# Patient Record
Sex: Female | Born: 1955 | Race: Black or African American | Hispanic: No | Marital: Married | Smoking: Never smoker
Health system: Southern US, Community
[De-identification: ages and names within clinical notes are randomized; demographics above are authoritative.]

## PROBLEM LIST (undated history)

## (undated) DIAGNOSIS — E785 Hyperlipidemia, unspecified: Secondary | ICD-10-CM

## (undated) DIAGNOSIS — Z992 Dependence on renal dialysis: Secondary | ICD-10-CM

## (undated) DIAGNOSIS — K219 Gastro-esophageal reflux disease without esophagitis: Secondary | ICD-10-CM

## (undated) DIAGNOSIS — G2401 Drug induced subacute dyskinesia: Secondary | ICD-10-CM

## (undated) DIAGNOSIS — J189 Pneumonia, unspecified organism: Secondary | ICD-10-CM

## (undated) DIAGNOSIS — I5032 Chronic diastolic (congestive) heart failure: Secondary | ICD-10-CM

## (undated) DIAGNOSIS — I739 Peripheral vascular disease, unspecified: Secondary | ICD-10-CM

## (undated) DIAGNOSIS — I639 Cerebral infarction, unspecified: Secondary | ICD-10-CM

## (undated) DIAGNOSIS — I10 Essential (primary) hypertension: Secondary | ICD-10-CM

## (undated) DIAGNOSIS — K5909 Other constipation: Secondary | ICD-10-CM

## (undated) DIAGNOSIS — T8859XA Other complications of anesthesia, initial encounter: Secondary | ICD-10-CM

## (undated) DIAGNOSIS — I219 Acute myocardial infarction, unspecified: Secondary | ICD-10-CM

## (undated) DIAGNOSIS — M1990 Unspecified osteoarthritis, unspecified site: Secondary | ICD-10-CM

## (undated) DIAGNOSIS — N2581 Secondary hyperparathyroidism of renal origin: Secondary | ICD-10-CM

## (undated) DIAGNOSIS — F32A Depression, unspecified: Secondary | ICD-10-CM

## (undated) DIAGNOSIS — N186 End stage renal disease: Secondary | ICD-10-CM

## (undated) DIAGNOSIS — I509 Heart failure, unspecified: Secondary | ICD-10-CM

## (undated) DIAGNOSIS — D631 Anemia in chronic kidney disease: Secondary | ICD-10-CM

## (undated) DIAGNOSIS — F329 Major depressive disorder, single episode, unspecified: Secondary | ICD-10-CM

## (undated) DIAGNOSIS — T4145XA Adverse effect of unspecified anesthetic, initial encounter: Secondary | ICD-10-CM

## (undated) DIAGNOSIS — I2699 Other pulmonary embolism without acute cor pulmonale: Secondary | ICD-10-CM

## (undated) DIAGNOSIS — N189 Chronic kidney disease, unspecified: Secondary | ICD-10-CM

## (undated) DIAGNOSIS — G629 Polyneuropathy, unspecified: Secondary | ICD-10-CM

## (undated) DIAGNOSIS — I25810 Atherosclerosis of coronary artery bypass graft(s) without angina pectoris: Secondary | ICD-10-CM

## (undated) HISTORY — PX: AV FISTULA PLACEMENT: SHX1204

## (undated) HISTORY — PX: CORONARY ANGIOPLASTY WITH STENT PLACEMENT: SHX49

## (undated) HISTORY — PX: EYE EXAMINATION UNDER ANESTHESIA W/ RETINAL CRYOTHERAPY AND RETINAL LASER: SHX1561

## (undated) HISTORY — PX: TUBAL LIGATION: SHX77

## (undated) HISTORY — PX: TRIGGER FINGER RELEASE: SHX641

---

## 1997-06-11 ENCOUNTER — Other Ambulatory Visit: Admission: RE | Admit: 1997-06-11 | Discharge: 1997-06-11 | Payer: Self-pay | Admitting: Obstetrics and Gynecology

## 1997-09-10 ENCOUNTER — Other Ambulatory Visit: Admission: RE | Admit: 1997-09-10 | Discharge: 1997-09-10 | Payer: Self-pay | Admitting: Obstetrics & Gynecology

## 1998-02-10 ENCOUNTER — Ambulatory Visit (HOSPITAL_COMMUNITY): Admission: RE | Admit: 1998-02-10 | Discharge: 1998-02-10 | Payer: Self-pay | Admitting: Internal Medicine

## 1998-02-10 ENCOUNTER — Encounter: Payer: Self-pay | Admitting: Internal Medicine

## 1998-02-27 ENCOUNTER — Emergency Department (HOSPITAL_COMMUNITY): Admission: EM | Admit: 1998-02-27 | Discharge: 1998-02-27 | Payer: Self-pay

## 1998-03-04 ENCOUNTER — Encounter (HOSPITAL_COMMUNITY): Admission: RE | Admit: 1998-03-04 | Discharge: 1998-06-02 | Payer: Self-pay | Admitting: Nephrology

## 1998-06-02 ENCOUNTER — Encounter (HOSPITAL_COMMUNITY): Admission: RE | Admit: 1998-06-02 | Discharge: 1998-08-31 | Payer: Self-pay | Admitting: Nephrology

## 1998-08-31 ENCOUNTER — Encounter (HOSPITAL_COMMUNITY): Admission: RE | Admit: 1998-08-31 | Discharge: 1998-11-29 | Payer: Self-pay | Admitting: Nephrology

## 1998-12-07 ENCOUNTER — Encounter (HOSPITAL_COMMUNITY): Admission: RE | Admit: 1998-12-07 | Discharge: 1999-03-07 | Payer: Self-pay | Admitting: Nephrology

## 1999-03-08 ENCOUNTER — Encounter: Admission: RE | Admit: 1999-03-08 | Discharge: 1999-06-06 | Payer: Self-pay | Admitting: Nephrology

## 1999-06-07 ENCOUNTER — Ambulatory Visit (HOSPITAL_COMMUNITY): Admission: RE | Admit: 1999-06-07 | Discharge: 1999-06-07 | Payer: Self-pay | Admitting: Nephrology

## 1999-06-07 ENCOUNTER — Encounter (HOSPITAL_COMMUNITY): Admission: RE | Admit: 1999-06-07 | Discharge: 1999-09-05 | Payer: Self-pay | Admitting: Nephrology

## 1999-09-01 ENCOUNTER — Ambulatory Visit (HOSPITAL_COMMUNITY): Admission: RE | Admit: 1999-09-01 | Discharge: 1999-09-01 | Payer: Self-pay | Admitting: Vascular Surgery

## 1999-09-01 ENCOUNTER — Encounter: Payer: Self-pay | Admitting: Vascular Surgery

## 1999-09-07 ENCOUNTER — Ambulatory Visit (HOSPITAL_COMMUNITY): Admission: RE | Admit: 1999-09-07 | Discharge: 1999-09-07 | Payer: Self-pay | Admitting: Nephrology

## 1999-09-13 ENCOUNTER — Encounter (HOSPITAL_COMMUNITY): Admission: RE | Admit: 1999-09-13 | Discharge: 1999-12-12 | Payer: Self-pay | Admitting: Nephrology

## 1999-12-13 ENCOUNTER — Encounter (HOSPITAL_COMMUNITY): Admission: RE | Admit: 1999-12-13 | Discharge: 2000-03-12 | Payer: Self-pay | Admitting: Nephrology

## 2000-01-19 ENCOUNTER — Ambulatory Visit (HOSPITAL_COMMUNITY): Admission: RE | Admit: 2000-01-19 | Discharge: 2000-01-19 | Payer: Self-pay | Admitting: Thoracic Surgery

## 2000-02-22 DIAGNOSIS — I639 Cerebral infarction, unspecified: Secondary | ICD-10-CM

## 2000-02-22 DIAGNOSIS — I219 Acute myocardial infarction, unspecified: Secondary | ICD-10-CM

## 2000-02-22 HISTORY — DX: Acute myocardial infarction, unspecified: I21.9

## 2000-02-22 HISTORY — PX: CORONARY ARTERY BYPASS GRAFT: SHX141

## 2000-02-22 HISTORY — DX: Cerebral infarction, unspecified: I63.9

## 2000-02-22 HISTORY — PX: LAPAROSCOPIC CHOLECYSTECTOMY: SUR755

## 2000-04-20 ENCOUNTER — Encounter: Payer: Self-pay | Admitting: Cardiovascular Disease

## 2000-04-20 ENCOUNTER — Ambulatory Visit (HOSPITAL_COMMUNITY): Admission: RE | Admit: 2000-04-20 | Discharge: 2000-04-20 | Payer: Self-pay | Admitting: Cardiovascular Disease

## 2000-05-10 ENCOUNTER — Ambulatory Visit (HOSPITAL_COMMUNITY): Admission: RE | Admit: 2000-05-10 | Discharge: 2000-05-10 | Payer: Self-pay | Admitting: Vascular Surgery

## 2000-05-18 ENCOUNTER — Ambulatory Visit (HOSPITAL_COMMUNITY): Admission: RE | Admit: 2000-05-18 | Discharge: 2000-05-18 | Payer: Self-pay | Admitting: Obstetrics and Gynecology

## 2000-05-18 ENCOUNTER — Encounter: Payer: Self-pay | Admitting: Obstetrics and Gynecology

## 2000-07-14 ENCOUNTER — Ambulatory Visit (HOSPITAL_COMMUNITY): Admission: RE | Admit: 2000-07-14 | Discharge: 2000-07-14 | Payer: Self-pay | Admitting: Nephrology

## 2000-07-14 ENCOUNTER — Encounter: Payer: Self-pay | Admitting: Nephrology

## 2000-07-16 ENCOUNTER — Emergency Department (HOSPITAL_COMMUNITY): Admission: EM | Admit: 2000-07-16 | Discharge: 2000-07-16 | Payer: Self-pay | Admitting: Emergency Medicine

## 2000-07-17 ENCOUNTER — Ambulatory Visit (HOSPITAL_COMMUNITY): Admission: RE | Admit: 2000-07-17 | Discharge: 2000-07-17 | Payer: Self-pay | Admitting: Vascular Surgery

## 2000-07-18 ENCOUNTER — Ambulatory Visit (HOSPITAL_COMMUNITY): Admission: RE | Admit: 2000-07-18 | Discharge: 2000-07-18 | Payer: Self-pay | Admitting: *Deleted

## 2000-07-18 ENCOUNTER — Encounter: Payer: Self-pay | Admitting: *Deleted

## 2000-08-06 ENCOUNTER — Encounter: Payer: Self-pay | Admitting: Emergency Medicine

## 2000-08-06 ENCOUNTER — Inpatient Hospital Stay (HOSPITAL_COMMUNITY): Admission: EM | Admit: 2000-08-06 | Discharge: 2000-08-08 | Payer: Self-pay | Admitting: Emergency Medicine

## 2000-08-07 ENCOUNTER — Encounter: Payer: Self-pay | Admitting: General Surgery

## 2000-08-09 ENCOUNTER — Encounter: Payer: Self-pay | Admitting: Emergency Medicine

## 2000-08-09 ENCOUNTER — Inpatient Hospital Stay (HOSPITAL_COMMUNITY): Admission: EM | Admit: 2000-08-09 | Discharge: 2000-08-10 | Payer: Self-pay | Admitting: Emergency Medicine

## 2000-08-12 ENCOUNTER — Encounter: Payer: Self-pay | Admitting: Emergency Medicine

## 2000-08-12 ENCOUNTER — Emergency Department (HOSPITAL_COMMUNITY): Admission: EM | Admit: 2000-08-12 | Discharge: 2000-08-12 | Payer: Self-pay | Admitting: Emergency Medicine

## 2000-08-28 ENCOUNTER — Encounter: Payer: Self-pay | Admitting: Nephrology

## 2000-08-28 ENCOUNTER — Inpatient Hospital Stay (HOSPITAL_COMMUNITY): Admission: EM | Admit: 2000-08-28 | Discharge: 2000-09-19 | Payer: Self-pay | Admitting: Emergency Medicine

## 2000-08-28 ENCOUNTER — Encounter: Payer: Self-pay | Admitting: Emergency Medicine

## 2000-08-29 ENCOUNTER — Encounter: Payer: Self-pay | Admitting: Nephrology

## 2000-08-30 ENCOUNTER — Encounter: Payer: Self-pay | Admitting: Nephrology

## 2000-09-01 ENCOUNTER — Encounter: Payer: Self-pay | Admitting: Nephrology

## 2000-09-04 ENCOUNTER — Encounter: Payer: Self-pay | Admitting: Cardiothoracic Surgery

## 2000-09-06 ENCOUNTER — Encounter: Payer: Self-pay | Admitting: Nephrology

## 2000-09-06 ENCOUNTER — Encounter: Payer: Self-pay | Admitting: Cardiothoracic Surgery

## 2000-09-07 ENCOUNTER — Encounter: Payer: Self-pay | Admitting: Cardiothoracic Surgery

## 2000-09-08 ENCOUNTER — Encounter: Payer: Self-pay | Admitting: Cardiothoracic Surgery

## 2000-09-10 ENCOUNTER — Encounter: Payer: Self-pay | Admitting: Pediatrics

## 2000-09-11 ENCOUNTER — Encounter: Payer: Self-pay | Admitting: Cardiothoracic Surgery

## 2000-09-12 ENCOUNTER — Encounter: Payer: Self-pay | Admitting: Cardiothoracic Surgery

## 2000-09-13 ENCOUNTER — Encounter: Payer: Self-pay | Admitting: Cardiothoracic Surgery

## 2000-09-14 ENCOUNTER — Encounter: Payer: Self-pay | Admitting: Cardiothoracic Surgery

## 2000-09-19 ENCOUNTER — Inpatient Hospital Stay (HOSPITAL_COMMUNITY)
Admission: RE | Admit: 2000-09-19 | Discharge: 2000-10-03 | Payer: Self-pay | Admitting: Physical Medicine & Rehabilitation

## 2000-09-20 ENCOUNTER — Encounter: Payer: Self-pay | Admitting: Physical Medicine & Rehabilitation

## 2000-10-02 ENCOUNTER — Encounter: Payer: Self-pay | Admitting: Physical Medicine & Rehabilitation

## 2000-11-09 ENCOUNTER — Encounter
Admission: RE | Admit: 2000-11-09 | Discharge: 2001-02-07 | Payer: Self-pay | Admitting: Physical Medicine & Rehabilitation

## 2000-12-21 ENCOUNTER — Other Ambulatory Visit: Admission: RE | Admit: 2000-12-21 | Discharge: 2000-12-21 | Payer: Self-pay | Admitting: Obstetrics and Gynecology

## 2001-02-08 ENCOUNTER — Encounter
Admission: RE | Admit: 2001-02-08 | Discharge: 2001-05-09 | Payer: Self-pay | Admitting: Physical Medicine & Rehabilitation

## 2001-03-01 ENCOUNTER — Encounter: Payer: Self-pay | Admitting: Cardiovascular Disease

## 2001-03-01 ENCOUNTER — Ambulatory Visit (HOSPITAL_COMMUNITY): Admission: RE | Admit: 2001-03-01 | Discharge: 2001-03-01 | Payer: Self-pay | Admitting: Cardiovascular Disease

## 2001-03-08 ENCOUNTER — Encounter: Payer: Self-pay | Admitting: Nephrology

## 2001-03-08 ENCOUNTER — Ambulatory Visit (HOSPITAL_COMMUNITY): Admission: RE | Admit: 2001-03-08 | Discharge: 2001-03-08 | Payer: Self-pay | Admitting: Nephrology

## 2001-03-16 ENCOUNTER — Encounter: Payer: Self-pay | Admitting: Emergency Medicine

## 2001-03-16 ENCOUNTER — Emergency Department (HOSPITAL_COMMUNITY): Admission: EM | Admit: 2001-03-16 | Discharge: 2001-03-16 | Payer: Self-pay | Admitting: Emergency Medicine

## 2001-04-19 ENCOUNTER — Encounter: Payer: Self-pay | Admitting: Nephrology

## 2001-04-19 ENCOUNTER — Ambulatory Visit (HOSPITAL_COMMUNITY): Admission: RE | Admit: 2001-04-19 | Discharge: 2001-04-19 | Payer: Self-pay | Admitting: Critical Care Medicine

## 2001-05-08 ENCOUNTER — Encounter: Admission: RE | Admit: 2001-05-08 | Discharge: 2001-07-12 | Payer: Self-pay | Admitting: *Deleted

## 2001-06-26 ENCOUNTER — Encounter: Payer: Self-pay | Admitting: Nephrology

## 2001-06-26 ENCOUNTER — Ambulatory Visit (HOSPITAL_COMMUNITY): Admission: RE | Admit: 2001-06-26 | Discharge: 2001-06-26 | Payer: Self-pay | Admitting: Nephrology

## 2001-11-06 ENCOUNTER — Encounter: Payer: Self-pay | Admitting: Nephrology

## 2001-11-06 ENCOUNTER — Ambulatory Visit (HOSPITAL_COMMUNITY): Admission: RE | Admit: 2001-11-06 | Discharge: 2001-11-06 | Payer: Self-pay | Admitting: Nephrology

## 2002-02-20 ENCOUNTER — Ambulatory Visit (HOSPITAL_COMMUNITY): Admission: RE | Admit: 2002-02-20 | Discharge: 2002-02-20 | Payer: Self-pay | Admitting: Nephrology

## 2002-02-20 ENCOUNTER — Encounter: Payer: Self-pay | Admitting: Nephrology

## 2002-07-16 ENCOUNTER — Encounter: Payer: Self-pay | Admitting: Cardiovascular Disease

## 2002-07-16 ENCOUNTER — Ambulatory Visit (HOSPITAL_COMMUNITY): Admission: RE | Admit: 2002-07-16 | Discharge: 2002-07-16 | Payer: Self-pay | Admitting: Cardiovascular Disease

## 2002-08-08 ENCOUNTER — Encounter: Payer: Self-pay | Admitting: Nephrology

## 2002-08-08 ENCOUNTER — Ambulatory Visit (HOSPITAL_COMMUNITY): Admission: RE | Admit: 2002-08-08 | Discharge: 2002-08-08 | Payer: Self-pay | Admitting: Nephrology

## 2002-08-20 ENCOUNTER — Encounter: Payer: Self-pay | Admitting: Urology

## 2002-08-20 ENCOUNTER — Ambulatory Visit (HOSPITAL_COMMUNITY): Admission: RE | Admit: 2002-08-20 | Discharge: 2002-08-20 | Payer: Self-pay | Admitting: Urology

## 2002-12-17 ENCOUNTER — Ambulatory Visit (HOSPITAL_COMMUNITY): Admission: RE | Admit: 2002-12-17 | Discharge: 2002-12-17 | Payer: Self-pay

## 2003-05-27 ENCOUNTER — Ambulatory Visit (HOSPITAL_COMMUNITY): Admission: RE | Admit: 2003-05-27 | Discharge: 2003-05-27 | Payer: Self-pay | Admitting: Nephrology

## 2003-06-22 DIAGNOSIS — I2699 Other pulmonary embolism without acute cor pulmonale: Secondary | ICD-10-CM

## 2003-06-22 HISTORY — DX: Other pulmonary embolism without acute cor pulmonale: I26.99

## 2003-07-04 ENCOUNTER — Inpatient Hospital Stay (HOSPITAL_COMMUNITY): Admission: RE | Admit: 2003-07-04 | Discharge: 2003-07-11 | Payer: Self-pay | Admitting: Cardiovascular Disease

## 2003-07-07 ENCOUNTER — Encounter (INDEPENDENT_AMBULATORY_CARE_PROVIDER_SITE_OTHER): Payer: Self-pay | Admitting: Cardiovascular Disease

## 2003-11-23 ENCOUNTER — Emergency Department (HOSPITAL_COMMUNITY): Admission: EM | Admit: 2003-11-23 | Discharge: 2003-11-23 | Payer: Self-pay | Admitting: Emergency Medicine

## 2003-11-24 ENCOUNTER — Encounter: Admission: RE | Admit: 2003-11-24 | Discharge: 2003-11-24 | Payer: Self-pay | Admitting: Internal Medicine

## 2003-11-28 ENCOUNTER — Ambulatory Visit (HOSPITAL_COMMUNITY): Admission: RE | Admit: 2003-11-28 | Discharge: 2003-11-28 | Payer: Self-pay | Admitting: Internal Medicine

## 2004-06-09 ENCOUNTER — Emergency Department (HOSPITAL_COMMUNITY): Admission: EM | Admit: 2004-06-09 | Discharge: 2004-06-10 | Payer: Self-pay | Admitting: *Deleted

## 2004-08-11 ENCOUNTER — Ambulatory Visit (HOSPITAL_COMMUNITY): Admission: RE | Admit: 2004-08-11 | Discharge: 2004-08-11 | Payer: Self-pay | Admitting: Internal Medicine

## 2004-11-09 ENCOUNTER — Inpatient Hospital Stay (HOSPITAL_COMMUNITY): Admission: AD | Admit: 2004-11-09 | Discharge: 2004-11-11 | Payer: Self-pay | Admitting: Cardiovascular Disease

## 2004-11-22 ENCOUNTER — Ambulatory Visit: Payer: Self-pay | Admitting: Emergency Medicine

## 2004-12-14 ENCOUNTER — Encounter: Admission: RE | Admit: 2004-12-14 | Discharge: 2005-02-10 | Payer: Self-pay | Admitting: Nephrology

## 2004-12-27 ENCOUNTER — Ambulatory Visit: Payer: Self-pay | Admitting: Emergency Medicine

## 2005-06-16 ENCOUNTER — Ambulatory Visit (HOSPITAL_COMMUNITY): Admission: RE | Admit: 2005-06-16 | Discharge: 2005-06-16 | Payer: Self-pay | Admitting: Nephrology

## 2005-08-04 ENCOUNTER — Ambulatory Visit (HOSPITAL_COMMUNITY): Admission: RE | Admit: 2005-08-04 | Discharge: 2005-08-04 | Payer: Self-pay | Admitting: Vascular Surgery

## 2005-09-08 ENCOUNTER — Ambulatory Visit (HOSPITAL_COMMUNITY): Admission: RE | Admit: 2005-09-08 | Discharge: 2005-09-08 | Payer: Self-pay | Admitting: Internal Medicine

## 2005-09-15 ENCOUNTER — Encounter: Admission: RE | Admit: 2005-09-15 | Discharge: 2005-10-12 | Payer: Self-pay | Admitting: Nephrology

## 2005-09-27 ENCOUNTER — Inpatient Hospital Stay (HOSPITAL_COMMUNITY): Admission: EM | Admit: 2005-09-27 | Discharge: 2005-10-01 | Payer: Self-pay | Admitting: Emergency Medicine

## 2005-09-29 ENCOUNTER — Encounter (INDEPENDENT_AMBULATORY_CARE_PROVIDER_SITE_OTHER): Payer: Self-pay | Admitting: Cardiology

## 2005-10-13 ENCOUNTER — Encounter: Admission: RE | Admit: 2005-10-13 | Discharge: 2005-11-07 | Payer: Self-pay | Admitting: Nephrology

## 2005-11-08 ENCOUNTER — Encounter: Admission: RE | Admit: 2005-11-08 | Discharge: 2005-12-08 | Payer: Self-pay | Admitting: Nephrology

## 2005-11-10 ENCOUNTER — Ambulatory Visit (HOSPITAL_COMMUNITY): Admission: RE | Admit: 2005-11-10 | Discharge: 2005-11-10 | Payer: Self-pay | Admitting: Vascular Surgery

## 2005-11-10 ENCOUNTER — Emergency Department (HOSPITAL_COMMUNITY): Admission: EM | Admit: 2005-11-10 | Discharge: 2005-11-10 | Payer: Self-pay | Admitting: Emergency Medicine

## 2005-12-21 ENCOUNTER — Other Ambulatory Visit: Admission: RE | Admit: 2005-12-21 | Discharge: 2005-12-21 | Payer: Self-pay | Admitting: Obstetrics and Gynecology

## 2006-01-31 ENCOUNTER — Encounter: Admission: RE | Admit: 2006-01-31 | Discharge: 2006-05-01 | Payer: Self-pay | Admitting: Nephrology

## 2006-02-02 ENCOUNTER — Encounter: Admission: RE | Admit: 2006-02-02 | Discharge: 2006-02-02 | Payer: Self-pay | Admitting: Nephrology

## 2006-04-25 ENCOUNTER — Ambulatory Visit (HOSPITAL_COMMUNITY): Admission: RE | Admit: 2006-04-25 | Discharge: 2006-04-26 | Payer: Self-pay | Admitting: Ophthalmology

## 2006-05-12 ENCOUNTER — Emergency Department (HOSPITAL_COMMUNITY): Admission: EM | Admit: 2006-05-12 | Discharge: 2006-05-12 | Payer: Self-pay | Admitting: Emergency Medicine

## 2006-06-01 ENCOUNTER — Ambulatory Visit (HOSPITAL_COMMUNITY): Admission: RE | Admit: 2006-06-01 | Discharge: 2006-06-01 | Payer: Self-pay | Admitting: Nephrology

## 2006-07-16 ENCOUNTER — Ambulatory Visit: Payer: Self-pay | Admitting: Vascular Surgery

## 2006-09-26 ENCOUNTER — Ambulatory Visit (HOSPITAL_COMMUNITY): Admission: RE | Admit: 2006-09-26 | Discharge: 2006-09-26 | Payer: Self-pay | Admitting: Nephrology

## 2006-10-31 ENCOUNTER — Ambulatory Visit: Payer: Self-pay | Admitting: Vascular Surgery

## 2006-11-16 ENCOUNTER — Ambulatory Visit (HOSPITAL_COMMUNITY): Admission: RE | Admit: 2006-11-16 | Discharge: 2006-11-16 | Payer: Self-pay | Admitting: Vascular Surgery

## 2007-02-21 ENCOUNTER — Ambulatory Visit (HOSPITAL_COMMUNITY): Admission: RE | Admit: 2007-02-21 | Discharge: 2007-02-21 | Payer: Self-pay | Admitting: Nephrology

## 2007-02-25 ENCOUNTER — Emergency Department (HOSPITAL_COMMUNITY): Admission: EM | Admit: 2007-02-25 | Discharge: 2007-02-25 | Payer: Self-pay | Admitting: Emergency Medicine

## 2007-03-08 ENCOUNTER — Ambulatory Visit: Payer: Self-pay | Admitting: *Deleted

## 2007-03-20 ENCOUNTER — Ambulatory Visit: Payer: Self-pay | Admitting: Vascular Surgery

## 2007-03-20 ENCOUNTER — Ambulatory Visit (HOSPITAL_COMMUNITY): Admission: RE | Admit: 2007-03-20 | Discharge: 2007-03-20 | Payer: Self-pay | Admitting: Vascular Surgery

## 2007-06-26 ENCOUNTER — Ambulatory Visit (HOSPITAL_COMMUNITY): Admission: RE | Admit: 2007-06-26 | Discharge: 2007-06-26 | Payer: Self-pay | Admitting: Nephrology

## 2007-09-07 ENCOUNTER — Encounter: Admission: RE | Admit: 2007-09-07 | Discharge: 2007-09-07 | Payer: Self-pay | Admitting: Nephrology

## 2007-10-04 ENCOUNTER — Ambulatory Visit (HOSPITAL_COMMUNITY): Admission: RE | Admit: 2007-10-04 | Discharge: 2007-10-04 | Payer: Self-pay | Admitting: Nephrology

## 2007-12-25 ENCOUNTER — Ambulatory Visit (HOSPITAL_COMMUNITY): Admission: RE | Admit: 2007-12-25 | Discharge: 2007-12-25 | Payer: Self-pay | Admitting: Nephrology

## 2008-01-14 ENCOUNTER — Ambulatory Visit (HOSPITAL_COMMUNITY): Admission: RE | Admit: 2008-01-14 | Discharge: 2008-01-14 | Payer: Self-pay | Admitting: Nephrology

## 2008-01-24 ENCOUNTER — Ambulatory Visit: Payer: Self-pay | Admitting: *Deleted

## 2008-01-29 ENCOUNTER — Ambulatory Visit: Payer: Self-pay | Admitting: Vascular Surgery

## 2008-01-29 ENCOUNTER — Ambulatory Visit (HOSPITAL_COMMUNITY): Admission: RE | Admit: 2008-01-29 | Discharge: 2008-01-29 | Payer: Self-pay | Admitting: Vascular Surgery

## 2008-03-04 ENCOUNTER — Ambulatory Visit: Payer: Self-pay | Admitting: Vascular Surgery

## 2008-03-31 ENCOUNTER — Ambulatory Visit (HOSPITAL_COMMUNITY): Admission: RE | Admit: 2008-03-31 | Discharge: 2008-03-31 | Payer: Self-pay | Admitting: Nephrology

## 2008-04-08 ENCOUNTER — Ambulatory Visit: Payer: Self-pay | Admitting: Vascular Surgery

## 2008-06-26 ENCOUNTER — Ambulatory Visit (HOSPITAL_COMMUNITY): Admission: RE | Admit: 2008-06-26 | Discharge: 2008-06-26 | Payer: Self-pay | Admitting: Nephrology

## 2008-07-31 ENCOUNTER — Ambulatory Visit (HOSPITAL_BASED_OUTPATIENT_CLINIC_OR_DEPARTMENT_OTHER): Admission: RE | Admit: 2008-07-31 | Discharge: 2008-07-31 | Payer: Self-pay | Admitting: *Deleted

## 2008-08-02 ENCOUNTER — Emergency Department (HOSPITAL_COMMUNITY): Admission: EM | Admit: 2008-08-02 | Discharge: 2008-08-02 | Payer: Self-pay | Admitting: *Deleted

## 2008-09-25 ENCOUNTER — Ambulatory Visit (HOSPITAL_COMMUNITY): Admission: RE | Admit: 2008-09-25 | Discharge: 2008-09-25 | Payer: Self-pay | Admitting: Nephrology

## 2008-11-03 ENCOUNTER — Ambulatory Visit (HOSPITAL_COMMUNITY): Admission: RE | Admit: 2008-11-03 | Discharge: 2008-11-03 | Payer: Self-pay | Admitting: Nephrology

## 2009-01-20 ENCOUNTER — Ambulatory Visit (HOSPITAL_COMMUNITY): Admission: RE | Admit: 2009-01-20 | Discharge: 2009-01-20 | Payer: Self-pay | Admitting: Nephrology

## 2009-01-22 ENCOUNTER — Ambulatory Visit (HOSPITAL_COMMUNITY): Admission: RE | Admit: 2009-01-22 | Discharge: 2009-01-22 | Payer: Self-pay | Admitting: Nephrology

## 2009-02-19 ENCOUNTER — Encounter: Admission: RE | Admit: 2009-02-19 | Discharge: 2009-02-19 | Payer: Self-pay | Admitting: Cardiology

## 2009-03-03 ENCOUNTER — Inpatient Hospital Stay (HOSPITAL_COMMUNITY): Admission: RE | Admit: 2009-03-03 | Discharge: 2009-03-04 | Payer: Self-pay | Admitting: Cardiology

## 2009-04-16 ENCOUNTER — Ambulatory Visit (HOSPITAL_COMMUNITY): Admission: RE | Admit: 2009-04-16 | Discharge: 2009-04-16 | Payer: Self-pay | Admitting: Nephrology

## 2009-04-19 ENCOUNTER — Encounter: Admission: RE | Admit: 2009-04-19 | Discharge: 2009-04-19 | Payer: Self-pay | Admitting: Orthopaedic Surgery

## 2009-06-11 ENCOUNTER — Ambulatory Visit (HOSPITAL_COMMUNITY): Admission: RE | Admit: 2009-06-11 | Discharge: 2009-06-11 | Payer: Self-pay | Admitting: Nephrology

## 2009-07-26 ENCOUNTER — Encounter: Admission: RE | Admit: 2009-07-26 | Discharge: 2009-07-26 | Payer: Self-pay | Admitting: Internal Medicine

## 2009-08-25 ENCOUNTER — Encounter: Admission: RE | Admit: 2009-08-25 | Discharge: 2009-08-25 | Payer: Self-pay | Admitting: Nephrology

## 2009-10-08 ENCOUNTER — Ambulatory Visit (HOSPITAL_COMMUNITY): Admission: RE | Admit: 2009-10-08 | Discharge: 2009-10-08 | Payer: Self-pay | Admitting: Nephrology

## 2009-10-20 ENCOUNTER — Inpatient Hospital Stay (HOSPITAL_COMMUNITY): Admission: EM | Admit: 2009-10-20 | Discharge: 2009-10-22 | Payer: Self-pay | Admitting: Emergency Medicine

## 2009-11-02 ENCOUNTER — Encounter: Admission: RE | Admit: 2009-11-02 | Discharge: 2009-11-02 | Payer: Self-pay | Admitting: Nephrology

## 2009-11-15 ENCOUNTER — Inpatient Hospital Stay (HOSPITAL_COMMUNITY)
Admission: EM | Admit: 2009-11-15 | Discharge: 2009-11-17 | Payer: Self-pay | Source: Home / Self Care | Admitting: Emergency Medicine

## 2009-11-17 ENCOUNTER — Encounter (INDEPENDENT_AMBULATORY_CARE_PROVIDER_SITE_OTHER): Payer: Self-pay | Admitting: Internal Medicine

## 2010-02-09 ENCOUNTER — Ambulatory Visit (HOSPITAL_COMMUNITY)
Admission: RE | Admit: 2010-02-09 | Discharge: 2010-02-09 | Payer: Self-pay | Source: Home / Self Care | Attending: Nephrology | Admitting: Nephrology

## 2010-03-11 ENCOUNTER — Encounter
Admission: RE | Admit: 2010-03-11 | Discharge: 2010-03-11 | Payer: Self-pay | Source: Home / Self Care | Attending: Internal Medicine | Admitting: Internal Medicine

## 2010-03-13 ENCOUNTER — Emergency Department (HOSPITAL_COMMUNITY)
Admission: EM | Admit: 2010-03-13 | Discharge: 2010-03-13 | Payer: Self-pay | Source: Home / Self Care | Admitting: Emergency Medicine

## 2010-03-14 ENCOUNTER — Encounter: Payer: Self-pay | Admitting: Nephrology

## 2010-03-16 LAB — POCT CARDIAC MARKERS
CKMB, poc: 1.7 ng/mL (ref 1.0–8.0)
CKMB, poc: 2.1 ng/mL (ref 1.0–8.0)
Myoglobin, poc: >500 ng/mL (ref 12–200)

## 2010-03-16 LAB — CBC
HCT: 35.4 % — ABNORMAL LOW (ref 36.0–46.0)
Hemoglobin: 11.2 g/dL — ABNORMAL LOW (ref 12.0–15.0)
MCV: 83.9 fL (ref 78.0–100.0)
RDW: 16.2 % — ABNORMAL HIGH (ref 11.5–15.5)
WBC: 7.9 10*3/uL (ref 4.0–10.5)

## 2010-03-16 LAB — BASIC METABOLIC PANEL
BUN: 21 mg/dL (ref 6–23)
Chloride: 94 mEq/L — ABNORMAL LOW (ref 96–112)
GFR calc non Af Amer: 6 mL/min — ABNORMAL LOW (ref >60)
Glucose, Bld: 105 mg/dL — ABNORMAL HIGH (ref 70–99)
Potassium: 3.8 mEq/L (ref 3.5–5.1)
Sodium: 139 mEq/L (ref 135–145)

## 2010-03-16 LAB — DIFFERENTIAL
Basophils Relative: 1 % (ref 0–1)
Eosinophils Relative: 6 % — ABNORMAL HIGH (ref 0–5)
Lymphs Abs: 2.9 10*3/uL (ref 0.7–4.0)
Monocytes Absolute: 0.4 10*3/uL (ref 0.1–1.0)
Monocytes Relative: 5 % (ref 3–12)

## 2010-04-14 ENCOUNTER — Other Ambulatory Visit (HOSPITAL_COMMUNITY): Payer: Self-pay | Admitting: Obstetrics and Gynecology

## 2010-04-14 DIAGNOSIS — Z1231 Encounter for screening mammogram for malignant neoplasm of breast: Secondary | ICD-10-CM

## 2010-04-16 ENCOUNTER — Ambulatory Visit (HOSPITAL_COMMUNITY)
Admission: RE | Admit: 2010-04-16 | Discharge: 2010-04-16 | Disposition: A | Discharge status: Home / Self Care | Payer: Medicare Other | Source: Ambulatory Visit | Attending: Obstetrics and Gynecology | Admitting: Obstetrics and Gynecology

## 2010-04-16 DIAGNOSIS — Z1231 Encounter for screening mammogram for malignant neoplasm of breast: Secondary | ICD-10-CM | POA: Insufficient documentation

## 2010-05-06 LAB — COMPREHENSIVE METABOLIC PANEL
ALT: 26 U/L (ref 0–35)
AST: 30 U/L (ref 0–37)
Albumin: 3.5 g/dL (ref 3.5–5.2)
Calcium: 9.2 mg/dL (ref 8.4–10.5)
Chloride: 102 mEq/L (ref 96–112)
Creatinine, Ser: 5.66 mg/dL — ABNORMAL HIGH (ref 0.4–1.2)
GFR calc Af Amer: 9 mL/min — ABNORMAL LOW (ref >60)
Sodium: 144 mEq/L (ref 135–145)

## 2010-05-06 LAB — GLUCOSE, CAPILLARY
Glucose-Capillary: 109 mg/dL — ABNORMAL HIGH (ref 70–99)
Glucose-Capillary: 164 mg/dL — ABNORMAL HIGH (ref 70–99)
Glucose-Capillary: 182 mg/dL — ABNORMAL HIGH (ref 70–99)
Glucose-Capillary: 187 mg/dL — ABNORMAL HIGH (ref 70–99)
Glucose-Capillary: 194 mg/dL — ABNORMAL HIGH (ref 70–99)
Glucose-Capillary: 249 mg/dL — ABNORMAL HIGH (ref 70–99)
Glucose-Capillary: 147 mg/dL — ABNORMAL HIGH (ref 70–99)

## 2010-05-06 LAB — BASIC METABOLIC PANEL
Calcium: 10.1 mg/dL (ref 8.4–10.5)
Calcium: 9.3 mg/dL (ref 8.4–10.5)
Creatinine, Ser: 4.26 mg/dL — ABNORMAL HIGH (ref 0.4–1.2)
GFR calc Af Amer: 13 mL/min — ABNORMAL LOW (ref >60)
GFR calc Af Amer: 6 mL/min — ABNORMAL LOW (ref >60)
GFR calc non Af Amer: 11 mL/min — ABNORMAL LOW (ref >60)
GFR calc non Af Amer: 5 mL/min — ABNORMAL LOW (ref >60)
Potassium: 4.7 mEq/L (ref 3.5–5.1)
Sodium: 138 mEq/L (ref 135–145)
Sodium: 142 mEq/L (ref 135–145)

## 2010-05-06 LAB — DIFFERENTIAL
Basophils Absolute: 0.1 10*3/uL (ref 0.0–0.1)
Basophils Relative: 2 % — ABNORMAL HIGH (ref 0–1)
Eosinophils Absolute: 0.4 10*3/uL (ref 0.0–0.7)
Eosinophils Relative: 7 % — ABNORMAL HIGH (ref 0–5)
Lymphs Abs: 1.6 10*3/uL (ref 0.7–4.0)
Neutrophils Relative %: 57 % (ref 43–77)

## 2010-05-06 LAB — CBC
HCT: 33.1 % — ABNORMAL LOW (ref 36.0–46.0)
Hemoglobin: 10 g/dL — ABNORMAL LOW (ref 12.0–15.0)
Hemoglobin: 10.5 g/dL — ABNORMAL LOW (ref 12.0–15.0)
MCH: 27.5 pg (ref 26.0–34.0)
MCHC: 32.4 g/dL (ref 30.0–36.0)
Platelets: 237 10*3/uL (ref 150–400)
Platelets: 245 10*3/uL (ref 150–400)
RBC: 3.63 MIL/uL — ABNORMAL LOW (ref 3.87–5.11)
RBC: 3.84 MIL/uL — ABNORMAL LOW (ref 3.87–5.11)
RBC: 4.04 MIL/uL (ref 3.87–5.11)
WBC: 5.3 10*3/uL (ref 4.0–10.5)
WBC: 5.8 10*3/uL (ref 4.0–10.5)

## 2010-05-06 LAB — CK TOTAL AND CKMB (NOT AT ARMC): Total CK: 129 U/L (ref 7–177)

## 2010-05-06 LAB — CULTURE, BLOOD (ROUTINE X 2)
Culture  Setup Time: 201109252255
Culture  Setup Time: 201109252255
Culture: NO GROWTH

## 2010-05-06 LAB — POCT CARDIAC MARKERS: Myoglobin, poc: >500 ng/mL (ref 12–200)

## 2010-05-06 LAB — TROPONIN I: Troponin I: 0.05 ng/mL (ref 0.00–0.06)

## 2010-05-06 LAB — CARDIAC PANEL(CRET KIN+CKTOT+MB+TROPI): Total CK: 122 U/L (ref 7–177)

## 2010-05-07 LAB — GLUCOSE, CAPILLARY
Glucose-Capillary: 107 mg/dL — ABNORMAL HIGH (ref 70–99)
Glucose-Capillary: 202 mg/dL — ABNORMAL HIGH (ref 70–99)
Glucose-Capillary: 254 mg/dL — ABNORMAL HIGH (ref 70–99)

## 2010-05-07 LAB — RENAL FUNCTION PANEL
BUN: 41 mg/dL — ABNORMAL HIGH (ref 6–23)
CO2: 30 mEq/L (ref 19–32)
Chloride: 95 mEq/L — ABNORMAL LOW (ref 96–112)
Glucose, Bld: 117 mg/dL — ABNORMAL HIGH (ref 70–99)
Phosphorus: 5.4 mg/dL — ABNORMAL HIGH (ref 2.3–4.6)
Potassium: 5.2 mEq/L — ABNORMAL HIGH (ref 3.5–5.1)
Sodium: 136 mEq/L (ref 135–145)

## 2010-05-07 LAB — DIFFERENTIAL
Basophils Relative: 2 % — ABNORMAL HIGH (ref 0–1)
Basophils Relative: 2 % — ABNORMAL HIGH (ref 0–1)
Eosinophils Absolute: 0.5 10*3/uL (ref 0.0–0.7)
Eosinophils Relative: 10 % — ABNORMAL HIGH (ref 0–5)
Lymphocytes Relative: 37 % (ref 12–46)
Lymphs Abs: 1.8 10*3/uL (ref 0.7–4.0)
Lymphs Abs: 2 10*3/uL (ref 0.7–4.0)
Monocytes Absolute: 0.4 10*3/uL (ref 0.1–1.0)
Monocytes Relative: 8 % (ref 3–12)
Monocytes Relative: 8 % (ref 3–12)
Neutro Abs: 2.3 10*3/uL (ref 1.7–7.7)
Neutrophils Relative %: 42 % — ABNORMAL LOW (ref 43–77)
Neutrophils Relative %: 44 % (ref 43–77)

## 2010-05-07 LAB — CARDIAC PANEL(CRET KIN+CKTOT+MB+TROPI)
CK, MB: 1.6 ng/mL (ref 0.3–4.0)
Relative Index: 1.3 (ref 0.0–2.5)
Total CK: 131 U/L (ref 7–177)
Troponin I: 0.03 ng/mL (ref 0.00–0.06)

## 2010-05-07 LAB — BRAIN NATRIURETIC PEPTIDE: Pro B Natriuretic peptide (BNP): 471 pg/mL — ABNORMAL HIGH (ref 0.0–100.0)

## 2010-05-07 LAB — BASIC METABOLIC PANEL
BUN: 32 mg/dL — ABNORMAL HIGH (ref 6–23)
GFR calc Af Amer: 7 mL/min — ABNORMAL LOW (ref >60)
GFR calc non Af Amer: 6 mL/min — ABNORMAL LOW (ref >60)
Potassium: 4.9 mEq/L (ref 3.5–5.1)
Sodium: 135 mEq/L (ref 135–145)

## 2010-05-07 LAB — CBC
HCT: 33.9 % — ABNORMAL LOW (ref 36.0–46.0)
Hemoglobin: 11 g/dL — ABNORMAL LOW (ref 12.0–15.0)
MCH: 27.1 pg (ref 26.0–34.0)
MCHC: 32.3 g/dL (ref 30.0–36.0)
MCV: 84 fL (ref 78.0–100.0)
Platelets: 242 10*3/uL (ref 150–400)
RBC: 4.02 MIL/uL (ref 3.87–5.11)
RBC: 4.06 MIL/uL (ref 3.87–5.11)
WBC: 5 10*3/uL (ref 4.0–10.5)
WBC: 5.3 10*3/uL (ref 4.0–10.5)

## 2010-05-07 LAB — POCT CARDIAC MARKERS: Troponin i, poc: <0.05 ng/mL (ref 0.00–0.09)

## 2010-05-07 LAB — MRSA PCR SCREENING: MRSA by PCR: NEGATIVE

## 2010-05-09 LAB — BASIC METABOLIC PANEL
BUN: 44 mg/dL — ABNORMAL HIGH (ref 6–23)
CO2: 26 mEq/L (ref 19–32)
Calcium: 8.9 mg/dL (ref 8.4–10.5)
Chloride: 95 mEq/L — ABNORMAL LOW (ref 96–112)
Creatinine, Ser: 9.12 mg/dL — ABNORMAL HIGH (ref 0.4–1.2)
GFR calc non Af Amer: 6 mL/min — ABNORMAL LOW (ref >60)
Glucose, Bld: 66 mg/dL — ABNORMAL LOW (ref 70–99)
Glucose, Bld: 85 mg/dL (ref 70–99)
Potassium: 4.5 mEq/L (ref 3.5–5.1)
Sodium: 139 mEq/L (ref 135–145)

## 2010-05-09 LAB — GLUCOSE, CAPILLARY
Glucose-Capillary: 123 mg/dL — ABNORMAL HIGH (ref 70–99)
Glucose-Capillary: 147 mg/dL — ABNORMAL HIGH (ref 70–99)
Glucose-Capillary: 83 mg/dL (ref 70–99)
Glucose-Capillary: 91 mg/dL (ref 70–99)

## 2010-05-09 LAB — CBC
HCT: 39 % (ref 36.0–46.0)
HCT: 43.5 % (ref 36.0–46.0)
Hemoglobin: 14.6 g/dL (ref 12.0–15.0)
MCV: 85.2 fL (ref 78.0–100.0)
MCV: 85.5 fL (ref 78.0–100.0)
Platelets: 163 10*3/uL (ref 150–400)
RBC: 4.57 MIL/uL (ref 3.87–5.11)
WBC: 4.8 10*3/uL (ref 4.0–10.5)
WBC: 5.2 10*3/uL (ref 4.0–10.5)

## 2010-05-09 LAB — TSH: TSH: 4.741 u[IU]/mL — ABNORMAL HIGH (ref 0.350–4.500)

## 2010-05-09 LAB — ALT: ALT: 21 U/L (ref 0–35)

## 2010-05-09 LAB — HEPATITIS B CORE ANTIBODY, TOTAL: Hep B Core Total Ab: NEGATIVE

## 2010-05-26 LAB — POCT I-STAT 4, (NA,K, GLUC, HGB,HCT)
HCT: 47 % — ABNORMAL HIGH (ref 36.0–46.0)
Hemoglobin: 16 g/dL — ABNORMAL HIGH (ref 12.0–15.0)
Sodium: 135 mEq/L (ref 135–145)

## 2010-05-26 LAB — POTASSIUM: Potassium: 6.5 mEq/L (ref 3.5–5.1)

## 2010-05-28 LAB — POTASSIUM: Potassium: 4.9 mEq/L (ref 3.5–5.1)

## 2010-05-31 LAB — CBC
Platelets: 267 10*3/uL (ref 150–400)
RBC: 4.63 MIL/uL (ref 3.87–5.11)
WBC: 7.5 10*3/uL (ref 4.0–10.5)

## 2010-05-31 LAB — POCT I-STAT, CHEM 8
Hemoglobin: 16 g/dL — ABNORMAL HIGH (ref 12.0–15.0)
Potassium: 4.4 mEq/L (ref 3.5–5.1)
Sodium: 138 mEq/L (ref 135–145)
TCO2: 33 mmol/L (ref 0–100)

## 2010-05-31 LAB — APTT: aPTT: 34 seconds (ref 24–37)

## 2010-05-31 LAB — GLUCOSE, CAPILLARY: Glucose-Capillary: 128 mg/dL — ABNORMAL HIGH (ref 70–99)

## 2010-05-31 LAB — DIFFERENTIAL
Lymphocytes Relative: 33 % (ref 12–46)
Lymphs Abs: 2.5 10*3/uL (ref 0.7–4.0)
Monocytes Relative: 7 % (ref 3–12)
Neutro Abs: 4.1 10*3/uL (ref 1.7–7.7)
Neutrophils Relative %: 55 % (ref 43–77)

## 2010-05-31 LAB — PROTIME-INR
INR: 1.1 (ref 0.00–1.49)
Prothrombin Time: 14.2 seconds (ref 11.6–15.2)

## 2010-06-22 ENCOUNTER — Other Ambulatory Visit: Payer: Self-pay | Admitting: General Surgery

## 2010-06-22 DIAGNOSIS — N6311 Unspecified lump in the right breast, upper outer quadrant: Secondary | ICD-10-CM

## 2010-06-22 HISTORY — PX: BREAST BIOPSY: SHX20

## 2010-06-22 HISTORY — PX: TOE AMPUTATION: SHX809

## 2010-06-24 ENCOUNTER — Other Ambulatory Visit: Payer: Self-pay | Admitting: General Surgery

## 2010-06-24 ENCOUNTER — Encounter (HOSPITAL_BASED_OUTPATIENT_CLINIC_OR_DEPARTMENT_OTHER): Payer: Medicare (Managed Care) | Attending: Internal Medicine

## 2010-06-24 ENCOUNTER — Ambulatory Visit
Admission: RE | Admit: 2010-06-24 | Discharge: 2010-06-24 | Disposition: A | Discharge status: Home / Self Care | Payer: Medicare Other | Source: Ambulatory Visit | Attending: General Surgery | Admitting: General Surgery

## 2010-06-24 DIAGNOSIS — Z992 Dependence on renal dialysis: Secondary | ICD-10-CM | POA: Insufficient documentation

## 2010-06-24 DIAGNOSIS — N6311 Unspecified lump in the right breast, upper outer quadrant: Secondary | ICD-10-CM

## 2010-06-24 DIAGNOSIS — I120 Hypertensive chronic kidney disease with stage 5 chronic kidney disease or end stage renal disease: Secondary | ICD-10-CM | POA: Insufficient documentation

## 2010-06-24 DIAGNOSIS — I252 Old myocardial infarction: Secondary | ICD-10-CM | POA: Insufficient documentation

## 2010-06-24 DIAGNOSIS — I2510 Atherosclerotic heart disease of native coronary artery without angina pectoris: Secondary | ICD-10-CM | POA: Insufficient documentation

## 2010-06-24 DIAGNOSIS — Z79899 Other long term (current) drug therapy: Secondary | ICD-10-CM | POA: Insufficient documentation

## 2010-06-24 DIAGNOSIS — Z8673 Personal history of transient ischemic attack (TIA), and cerebral infarction without residual deficits: Secondary | ICD-10-CM | POA: Insufficient documentation

## 2010-06-24 DIAGNOSIS — N186 End stage renal disease: Secondary | ICD-10-CM | POA: Insufficient documentation

## 2010-06-24 DIAGNOSIS — E1149 Type 2 diabetes mellitus with other diabetic neurological complication: Secondary | ICD-10-CM | POA: Insufficient documentation

## 2010-06-24 DIAGNOSIS — E1142 Type 2 diabetes mellitus with diabetic polyneuropathy: Secondary | ICD-10-CM | POA: Insufficient documentation

## 2010-06-24 DIAGNOSIS — Z7982 Long term (current) use of aspirin: Secondary | ICD-10-CM | POA: Insufficient documentation

## 2010-06-24 DIAGNOSIS — L97509 Non-pressure chronic ulcer of other part of unspecified foot with unspecified severity: Secondary | ICD-10-CM | POA: Insufficient documentation

## 2010-06-24 DIAGNOSIS — Z951 Presence of aortocoronary bypass graft: Secondary | ICD-10-CM | POA: Insufficient documentation

## 2010-06-24 DIAGNOSIS — E1169 Type 2 diabetes mellitus with other specified complication: Secondary | ICD-10-CM | POA: Insufficient documentation

## 2010-06-25 ENCOUNTER — Ambulatory Visit
Admission: RE | Admit: 2010-06-25 | Discharge: 2010-06-25 | Disposition: A | Discharge status: Home / Self Care | Payer: Medicare Other | Source: Ambulatory Visit | Attending: Family Medicine | Admitting: Family Medicine

## 2010-06-25 ENCOUNTER — Other Ambulatory Visit: Payer: Self-pay | Admitting: Family Medicine

## 2010-06-25 DIAGNOSIS — L8990 Pressure ulcer of unspecified site, unspecified stage: Secondary | ICD-10-CM

## 2010-06-25 DIAGNOSIS — IMO0002 Reserved for concepts with insufficient information to code with codable children: Secondary | ICD-10-CM

## 2010-06-25 DIAGNOSIS — I10 Essential (primary) hypertension: Secondary | ICD-10-CM

## 2010-06-25 DIAGNOSIS — R0989 Other specified symptoms and signs involving the circulatory and respiratory systems: Secondary | ICD-10-CM

## 2010-06-25 NOTE — Assessment & Plan Note (Signed)
[facility]  NAME:  Faith [name], Faith [name] NO.:  [id number]  MEDICAL RECORD NO.:  [id number]      DATE OF BIRTH:  1956/01/04  PHYSICIAN:  Maxwell Caul, M.D. VISIT DATE:  06/24/2010                                  OFFICE VISIT   Faith [name] is a very pleasant 55 year old woman who has been referred by Dr. Dorothe Pea with the [facility] program for review of an ulcer on her plantar left second toe.  The patient is a type 2 diabetic on dialysis.  She tells me she has been a diabetic for 20 years.  She is aware she has neuropathy, but is unaware of any vascular problems.  She tells me that roughly a week and half ago one of our attendants noted that she had a wound on her foot.  Her husband apparently pays attention to her feet and noted discoloration and a wound on the left second toe. She has been on antibiotics, however, she is not precisely sure which ones.  She is insensate, therefore does not really experience any pain but she has not had any fever, chills, and her blood sugars have been generally stable.  MEDICAL HISTORY: 1. Coronary artery disease with an MI and bypass in 2002. 2. Hypertension. 3. History of CVA with mild residual left-sided weakness. 4. Diabetic neuropathy. 5. End-stage renal disease on dialysis. 6. History of hyperparathyroidism. 7. History of anemia. 8. Cholecystectomy in 2002.  Medication list is reviewed.  She is on aspirin, Sensipar, Neurontin, amlodipine, fenofibrate, Zetia, Lyrica, glipizide, Renagel, Protonix, and Phenergan p.r.n.  REVIEW OF SYSTEMS:  RESPIRATORY:  The patient does not complain of shortness of breath.  CARDIAC:  No chest pain and no exertional chest pain.  EXTREMITIES:  She does not complain of claudication in either leg.  PHYSICAL EXAMINATION:  VITAL SIGNS:  Her temperature is 99.7, pulse 74, respirations 20, blood pressure 176/76, her CBG was 153. RESPIRATORY:  Clear air entry  bilaterally. CARDIAC:  Heart sounds were normal.  There is no gallops. EXTREMITIES:  Her peripheral pulses were palpable at the dorsalis pedis and popliteal.  Her ABI on the left was 1.06. NEUROLOGIC:  She has markedly reduced vibration sense, left to the knee, right to roughly the mid tibia.  She has decreased sensation to microfilament test.  She probably is on the way to a Charcot foot deformity, especially on the right.  WOUND EXAM:  The area in question was on her left second toe.  This measured 0.5 x 0.5 x 0.2.  This area was cultured.  There is significant discoloration involving the whole left second toe, perhaps extending somewhat proximally into the dorsal base of the second toe.  As mentioned, the patient is insensate.  She does not experience pain.  The discoloration is probably cellulitis.  However, whether there could be some digital ischemia here I am uncertain.  IMPRESSION: 1. Wegener's grade 2 wound at the tip of the left second toe.  We will     x-ray this to make sure there is not evidence of underlying     osteomyelitis.  The area in question has been cultured.  I believe     she completed a short course of Septra.  I have changed this to  doxycycline and will adjust this based on the cultures done in the     office.  I think the patient probably should have ABIs and arterial     Dopplers at some point, although for purposes today, her peripheral     pulses were palpable and I suspect her arterial supply is probably     adequate.  With specifics of the wound. we dressed this with Prisma hydrogel, Kerlix, and a toe sock.  We put her in a healing sandal which was off- loaded.  As mentioned, she did receive a prescription for doxycycline 100 b.i.d. for 2 weeks.  We will see her back in a week's time.          ______________________________ Maxwell Caul, M.D.     MGR/MEDQ  D:  06/24/2010  T:  06/25/2010  Job:  [id number]

## 2010-06-28 ENCOUNTER — Ambulatory Visit
Admission: RE | Admit: 2010-06-28 | Discharge: 2010-06-28 | Disposition: A | Discharge status: Home / Self Care | Payer: No Typology Code available for payment source | Source: Ambulatory Visit | Attending: Family Medicine | Admitting: Family Medicine

## 2010-06-28 DIAGNOSIS — R0989 Other specified symptoms and signs involving the circulatory and respiratory systems: Secondary | ICD-10-CM

## 2010-06-29 ENCOUNTER — Ambulatory Visit
Admission: RE | Admit: 2010-06-29 | Discharge: 2010-06-29 | Disposition: A | Discharge status: Home / Self Care | Payer: Medicare Other | Source: Ambulatory Visit | Attending: General Surgery | Admitting: General Surgery

## 2010-06-29 ENCOUNTER — Other Ambulatory Visit: Payer: Self-pay | Admitting: Diagnostic Radiology

## 2010-06-29 DIAGNOSIS — N6311 Unspecified lump in the right breast, upper outer quadrant: Secondary | ICD-10-CM

## 2010-06-30 ENCOUNTER — Inpatient Hospital Stay: Admission: RE | Admit: 2010-06-30 | Payer: Medicare Other | Source: Ambulatory Visit

## 2010-07-01 ENCOUNTER — Other Ambulatory Visit (HOSPITAL_BASED_OUTPATIENT_CLINIC_OR_DEPARTMENT_OTHER): Payer: Self-pay | Admitting: Internal Medicine

## 2010-07-01 LAB — GLUCOSE, CAPILLARY

## 2010-07-06 NOTE — Procedures (Signed)
VASCULAR LAB EXAM   INDICATION:  Possible occluded right upper dialysis graft.   HISTORY:  Diabetes:  Yes.  Cardiac:  Coronary artery disease.  Hypertension:  No.   EXAM:  Duplex right upper arm graft.   IMPRESSION:  1. No flow was detected in the right upper arm dialysis graft.  2. Doppler arterial signal of right brachial artery was within normal      limits.   ___________________________________________  P. Liliane Bade, M.D.   DP/MEDQ  D:  03/09/2007  T:  03/09/2007  Job:  [id number]

## 2010-07-06 NOTE — Assessment & Plan Note (Signed)
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  The loop  graft has failed in the right forearm, and attempts at thrombolysis were  unsuccessful because of a long strictured area in the vein draining this  graft.  This was reviewed and the optimal site for insertion of the  graft on the right side is into the high brachial or axillary vein.  Discussed this with her and her family, and we will plan to insert a  right upper arm AV graft on Thursday, September 25 at [facility] as  an outpatient, and hopefully this will provide a satisfactory site for  vascular access for this nice lady.  Her central venogram was reviewed,  and she does have a widely patent central venous system on the right.   Quita Skye Hart Rochester, M.D.  Electronically Signed   JDL/MEDQ  D:  10/31/2006  T:  11/01/2006  Job:  [id number]

## 2010-07-06 NOTE — Op Note (Signed)
NAMEDAEJA, HELDERMAN              ACCOUNT NO.:  [id number]   MEDICAL RECORD NO.:  [id number]          PATIENT TYPE:  AMB   LOCATION:  SDS                          FACILITY:  [facility]   PHYSICIAN:  Larina Earthly, M.D.    DATE OF BIRTH:  28-Apr-1955   DATE OF PROCEDURE:  03/20/2007  DATE OF DISCHARGE:                               OPERATIVE REPORT   PREOPERATIVE DIAGNOSIS:  End-stage renal disease with occluded right arm  arteriovenous Gore-Tex graft.   POSTOPERATIVE DIAGNOSIS:  End-stage renal disease with occluded right  arm arteriovenous Gore-Tex graft.   PROCEDURE:  Thrombectomy and revision of right upper arm AV Gore-Tex  graft.   SURGEON:  Larina Earthly, M.D.   ASSISTANT:  Gae Bon, PA-C.   ANESTHESIA:  MAC.   COMPLICATIONS:  None.   DISPOSITION:  To recovery room stable.   INDICATIONS:  The patient is a 55 year old black female with history of  end-stage renal disease.  She had a functional left upper arm AV graft  which was beginning to deteriorate.  The decision has been made to place  a new right upper arm graft and preparation for new access.  This was  done in the September 2008.  The right upper arm graft subsequently  failed.  She underwent thrombolysis and angioplasty in interventional  radiology and this again failed.  She was seen in our office in early  January and decision was made to try an attempted thrombectomy prior to  abandoning the right upper arm graft.  She is taken operating room at  this time.   PROCEDURE IN DETAIL:  The patient taken to room, placed supine position  where the area of the right arm, right axilla were prepped and draped  usual sterile fashion.  Using local anesthesia was made over the  axillary anastomosis, carried down to isolate the graft anastomosis.  The vein was quite small.  The axillary region was explored further and  there was a second axillary vein which appeared to be a somewhat better  caliber.  The old venous  anastomosis was ligated and the graft was  divided.  The graft itself was thrombectomized.  The arterialized plug  was removed and a good inflow was encountered.  This was flushed  heparinized and reoccluded.  The graft brought into approximation with  the second vein and the vein was occluded proximally and distally,  opened with 11 blade extended longitudinally with Potts scissors.  The  graft spatulated and sewn end-to-side to the vein with a  running 6-0 Prolene suture.  Clamps removed.  Good thrill was noted.  The vein was still somewhat small caliber.  The wounds were irrigated  with saline.  Hemostasis obtained electrocautery, wounds were closed 3-0  Vicryl in the subcutaneous and subcuticular tissue.  Benzoin and Steri-  Strips were applied.      Larina Earthly, M.D.  Electronically Signed     TFE/MEDQ  D:  03/20/2007  T:  03/20/2007  Job:  [id number]

## 2010-07-06 NOTE — Op Note (Signed)
Faith [name], Faith [name]              ACCOUNT NO.:  [id number]   MEDICAL RECORD NO.:  [id number]          PATIENT TYPE:  AMB   LOCATION:  SDS                          FACILITY:  [facility]   PHYSICIAN:  Quita Skye. Hart Rochester, M.D.  DATE OF BIRTH:  1955-04-12   DATE OF PROCEDURE:  11/16/2006  DATE OF DISCHARGE:  11/16/2006                               OPERATIVE REPORT   PREOPERATIVE DIAGNOSIS:  End-stage renal disease.   POSTOPERATIVE DIAGNOSIS:  End-stage renal disease.   OPERATION:  Insertion of right upper arm brachial artery to axillary  vein AV Gore-Tex graft with 6 mm Gore-Tex.   SURGEON:  Quita Skye. Hart Rochester, M.D.   ASSISTANT:  Nurse.   ANESTHESIA:  Local.   PROCEDURE:  The patient was taken to the operating room, placed in the  supine position at which time the right upper extremity was prepped  Betadine scrub solution, draped in a routine sterile manner.  After  infiltration of 1% Xylocaine with epinephrine, longitudinal incisions  was made through previous scar in the distal upper arm.  An old piece of  graft from a forearm revision was removed and the brachial artery was  exposed beneath this, encircled with vessel loops.  Artery had an  excellent pulse and was free of disease.  It was a little on the small  side, probably 3 mm in size.  Second incision was made just distal to  the axilla and the high brachial and axillary vein were dissected free  adjacent to the artery.  It was a 5 mm vein.  The tunnel was then  created on the anterior aspect arm after infiltration with 0.5%  Xylocaine and 6 mm Gore-Tex graft delivered through the tunnel.  No  heparin was used.  Brachial artery was occluded proximally and distally  with vessel loops opened 15 blade, extended with Potts scissors.  Gore-  Tex anastomosed end-to-side with 6-0 Prolene.  Following this the vein  was ligated distally and transected and an end-to-end anastomosis was  done with 6-0 Prolene with a fairly good size match,  vein being just  slightly smaller than the Gore-Tex at 5 mm in size.  This was completed,  there was an excellent pulse and palpable thrill in the graft.  No  protamine or heparin was given.  Wound was irrigated with saline, closed  in layers with Vicryl subcuticular fashion.  Sterile dressing applied.  The patient taken to recovery in satisfactory condition.      Quita Skye Hart Rochester, M.D.  Electronically Signed     JDL/MEDQ  D:  11/16/2006  T:  11/16/2006  Job:  [id number]

## 2010-07-06 NOTE — Assessment & Plan Note (Signed)
OFFICE VISIT   DARLING, Faith [name]  DOB:  [birth year]                                       03/08/2007  ZOXWR#:[id number]   Patient was seen as an add-on today with problems regarding her vascular  access.  She dialyzes at [facility].  Has a right  upper arm AV graft that was placed by Dr. Hart Rochester on 11/16/2006.  She has  a left upper arm AV graft which was functioning; however, clearance was  poor.  The right upper arm graft was placed for new access with plans to  eventually ligate the left upper arm graft.   She had lysis of the right upper arm graft performed on 02/21/07.  The  graft was patent at that time; however, flow was noted to be poor.  The  graft has now subsequently thrombosed.  She is currently being dialyzed  via her left upper arm graft.  This graft is degenerating with poor  clearance.   Duplex evaluation of her right upper arm graft today reveals it to be  occluded.   PHYSICAL EXAMINATION:  Patient is a moderately obese 55 year old [place]-  [place] female.  Her pulse is 95 per minute, and her respirations are  18 per minute.  O2 sat 96%.  She is alert and oriented.  No acute  distress.  She has a 2+ right radial pulse.   Waveform analysis of the right brachial artery reveals triphasic  waveform.  Therefore, inflow looks good.   I do think it is worthwhile to get this right upper arm graft working;  therefore, she will be placed on the operative schedule for thrombectomy  of the right upper arm graft with possible revision.   Balinda Quails, M.D.  Electronically Signed   PGH/MEDQ  D:  03/08/2007  T:  03/09/2007  Job:  [id number]

## 2010-07-06 NOTE — Assessment & Plan Note (Signed)
OFFICE VISIT   Faith [name], Faith [name]  DOB:  1955-02-27                                       01/24/2008  BJYNW#:[id number]   The patient is an end-stage renal failure patient dialysis Monday,  Wednesday, Friday.  She is currently dialyzed via a right upper arm  arteriovenous graft.   She has a left brachial cephalic arteriovenous fistula which remains  patent.  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  She has chronic left arm swelling and pain.  Heaviness in her  left arm.   Evaluation reveals a generally well-appearing 55 year old female.  Alert  and oriented.  Left arm reveals chronic edema.  No ulceration.  Patent  left brachial cephalic arteriovenous fistula.   Will schedule for ligation of left brachial cephalic AV fistula to be  carried out by Dr. Darrick Penna 01/29/2008.  Stop Plavix today until following  surgery.   Balinda Quails, M.D.  Electronically Signed   PGH/MEDQ  D:  01/24/2008  T:  01/25/2008  Job:  [id number]

## 2010-07-06 NOTE — Op Note (Signed)
NAMEHIROKO, Faith [name]              ACCOUNT NO.:  [id number]   MEDICAL RECORD NO.:  [id number]          PATIENT TYPE:  AMB   LOCATION:  SDS                          FACILITY:  [facility]   PHYSICIAN:  Janetta Hora. Fields, MD  DATE OF BIRTH:  [birth year]   DATE OF PROCEDURE:  01/29/2008  DATE OF DISCHARGE:  01/29/2008                               OPERATIVE REPORT   PROCEDURE:  Ligation of left upper arm arteriovenous fistula.   PREOPERATIVE DIAGNOSIS:  Venous hypertension, left upper arm  arteriovenous fistula.   POSTOPERATIVE DIAGNOSIS:  Venous hypertension, left upper arm  arteriovenous fistula.   ANESTHESIA:  Local with IV sedation.   INDICATIONS:  The patient is a 55 year old female who has signs and  symptoms of venous hypertension from a preexisting left upper arm AV  fistula.  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   OPERATIVE DETAILS:  After obtaining informed consent, the patient was  taken to the operating room.  The patient was placed in the supine  position on the operating table.  After adequate sedation, the patient's  entire left upper extremity was prepped and draped in the usual sterile  fashion.  A transverse incision was made over the AV fistula on the left  arm just above the antecubital crease.  Incision was carried down  through the subcutaneous tissues down to the proximal portion of the  fistula.  This was dissected free circumferentially.  This was then  doubly ligated with a 0 silk tie.  There was no further flow into the  fistula in the upper portion of the arm.  The patient had a palpable  brachial pulse below the level of the ligation.  Subcutaneous tissues  were reapproximated using running 3-0 Vicryl suture.  Skin was closed  with 4-0 Vicryl subcuticular stitch.  The patient tolerated the  procedure well and there were no complications.  Instrument, sponge, and  needle counts were correct at the end of the case.  The patient was  taken to the  recovery room in stable condition.      Janetta Hora. Fields, MD  Electronically Signed     CEF/MEDQ  D:  01/29/2008  T:  01/29/2008  Job:  [id number]

## 2010-07-06 NOTE — Assessment & Plan Note (Signed)
OFFICE VISIT   MERTHA, CLYATT  DOB:  1956/02/14                                       04/08/2008  ZOXWR#:[id number]   The patient had ligation of a left upper arm AV fistula by Dr. Darrick Penna on  01/29/2008.  She currently has a functioning right upper arm graft which  she is using for dialysis.  I had seen her in January of this year to  evaluate the aneurysms where her previous fistula had been ligated and  that time I did not see any significant erythema or drainage overlying  the aneurysms.  She comes in for a routine wound check.  Again the  aneurysms had not changed and this overlying skin is intact without  significant erythema or drainage.  I have explained that we could excise  these aneurysms if they became a problem; however, currently I really do  not see any significant problem with these.  Her upper arm graft in the  right is functioning well.   Di Kindle. Edilia Bo, M.D.  Electronically Signed   CSD/MEDQ  D:  04/08/2008  T:  04/09/2008  Job:  [id number]

## 2010-07-06 NOTE — Assessment & Plan Note (Signed)
OFFICE VISIT   Faith, [name]  DOB:  1955/05/31                                       03/04/2008  ZOXWR#:[id number]   I saw the patient in the office today for followup.  She had a left  upper arm AV fistula ligated because of venous hypertension by Dr.  Darrick Penna on 01/29/2008.  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  She came in to have this checked out.   PHYSICAL EXAMINATION:  On examination her incision where the fistula was  ligated has healed nicely.  The fistula is thrombosed.  There is no  erythema or drainage overlying the area of concern.  The skin is just  somewhat thinned out.  I have recommended they keep bacitracin on this  wound and cover it with a Band-Aid and change it once or twice a day.  I  plan on seeing her back in 3 weeks.  If this does not heal the only  other option would be to excise these two thrombosed aneurysms in the  operating room.   Di Kindle. Edilia Bo, M.D.  Electronically Signed   CSD/MEDQ  D:  03/04/2008  T:  03/05/2008  Job:  [id number]

## 2010-07-06 NOTE — Op Note (Signed)
NAMESENA, HOOPINGARNER NO.:  [id number]   MEDICAL RECORD NO.:  [id number]          PATIENT TYPE:  AMB   LOCATION:  DSC                          FACILITY:  [facility]   PHYSICIAN:  Tennis Must Meyerdierks, M.D.DATE OF BIRTH:  07-Jan-1956   DATE OF PROCEDURE:  07/31/2008  DATE OF DISCHARGE:                               OPERATIVE REPORT   PREOPERATIVE DIAGNOSIS:  Trigger finger, right ring.   POSTOPERATIVE DIAGNOSIS:  Trigger finger, right ring.   PROCEDURE:  Release of A1 pulley, right ring finger.   SURGEON:  Lowell Bouton, MD   ANESTHESIA:  Marcaine 0.5% local with sedation.   OPERATIVE FINDINGS:  The patient had significant flexor tenosynovitis  around the flexor tendons of the ring finger.  There was no evidence of  a cyst.   PROCEDURE:  Under 0.5% Marcaine local anesthesia without a tourniquet on  the right arm, the right hand was prepped and draped in the usual  fashion.  An oblique incision was made in line with the distal palmar  crease overlying the ring finger.  Blunt dissection was carried through  the subcutaneous tissues and bleeding points were coagulated.  Blunt  dissection was carried down to the flexor tendon sheath and the A1  pulley was incised sharply.  The pulley was completely released with  scissors.  The finger was then fully flexed, and there was no further  triggering.  The wound was irrigated with saline.  The skin was closed  with 4-0 nylon sutures.  Sterile dressings were applied.  The patient  went to the recovery room awake, stable, and in good condition.      Lowell Bouton, M.D.  Electronically Signed     EMM/MEDQ  D:  07/31/2008  T:  08/01/2008  Job:  [id number]   cc:   Della Goo, M.D.

## 2010-07-08 ENCOUNTER — Encounter (INDEPENDENT_AMBULATORY_CARE_PROVIDER_SITE_OTHER): Payer: PRIVATE HEALTH INSURANCE | Admitting: Vascular Surgery

## 2010-07-08 ENCOUNTER — Other Ambulatory Visit (HOSPITAL_COMMUNITY): Payer: Self-pay | Admitting: Nephrology

## 2010-07-08 DIAGNOSIS — L98499 Non-pressure chronic ulcer of skin of other sites with unspecified severity: Secondary | ICD-10-CM

## 2010-07-08 DIAGNOSIS — I878 Other specified disorders of veins: Secondary | ICD-10-CM

## 2010-07-08 DIAGNOSIS — I739 Peripheral vascular disease, unspecified: Secondary | ICD-10-CM

## 2010-07-08 NOTE — Assessment & Plan Note (Signed)
OFFICE VISIT  SAMARRAH, TRANCHINA DOB:  Oct 17, 1955                                       07/08/2010 ZOXWR#:[id number]  CHIEF COMPLAINT:  Wound on left foot.  HISTORY OF PRESENT ILLNESS:  The patient is a 55 year old female referred by Dr. Dorothe Pea for evaluation of a nonhealing wound of her left second toe.  The patient states that the wound has been there for several weeks and has become progressively worse.  She was recently seen by Dr. Dorothe Pea who ordered a foot x-ray on May 4th.  This showed most likely acute osteomyelitis in the distal left second toe.  The patient denies any significant pain in the toe.  She does not have any prior claudication symptoms.  She also had an arterial duplex exam performed at [facility] on May 7th which showed a toe pressure on the left side of 197 mmHg, but she had calcified vessels which were noncompressible.  ABI on the right side was also noncompressible with a digit pressure of 98 on the right.  She had primarily biphasic waveforms in the popliteal and tibial vessels bilaterally.  The patient was also seen recently by Dr. Aldean Baker who recommended amputation of her left second toe.  The patient is seen today at the request of Dr. Dorothe Pea to see whether or not revascularization would be necessary to heal up the toe amputation.  PAST MEDICAL HISTORY:  The patient's chronic medical problems include end-stage renal disease with Monday, Wednesday, Friday hemodialysis at [facility].  The patient also has a history of diabetes with associated neuropathy and retinopathy.  She also has a history of hypertension, elevated cholesterol and coronary artery disease.  She had coronary bypass grafting in 2002.  She also had a stroke in 2002.  She has recovered from the stroke.  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  SOCIAL HISTORY:  She is married.  She is a nonsmoker and nonconsumer of alcohol.  FAMILY HISTORY:  No history of early vascular disease at age less than 36.  REVIEW OF SYSTEMS:  A full 12-point review of systems was performed with the patient today. URINARY:  Remarkable for end-stage renal disease with Monday, Wednesday, Friday dialysis. HEMATOLOGIC:  She is currently on Plavix and aspirin. HEENT:  She has retinopathy, as mentioned above.  Ears, nose and throat are otherwise normal. MUSCULOSKELETAL:  She has chronic back arthritis. All other systems are negative.  ALLERGIES:  She has allergies listed to codeine and Tylox.  MEDICATIONS:  Include aspirin 81 mg once a day, fenofibrate 134 mg once a day, Nitrostat p.r.n., amlodipine 5 mg p.o. once a day, gabapentin 400 mg once in the morning, Plavix 75 mg once a day, promethazine 25 mg p.r.n. nausea, Lyrica 50 mg p.o. q.h.s., Tylenol p.r.n., Sensipar 90 mg daily, Zetia 10 mg daily, Rena-Vite 1 tablet daily, glipizide 5 mg 1/2 tablet twice a day, Renagel 800 mg 2 tablets t.i.d. and pantoprazole 40 mg p.o. q.h.s.  PHYSICAL EXAMINATION:  Vital Signs:  Blood pressure 182/83 in the left arm, heart rate is 80 and regular, respirations 20.  HEENT: Unremarkable.  Neck:  2+ carotid pulses without bruit.  Chest:  Clear to auscultation.  Cardiac Exam:  Regular rate rhythm without murmur.  Abdomen:  Soft, nontender, nondistended, no masses, slightly obese. Extremities:  She has an easily palpable thrill in the right upper arm AV graft.  She has 2+ femoral pulses bilaterally.  She has absent popliteal and pedal pulses.  She has early gangrenous changes of the left second toe with some edema in the left foot.  Musculoskeletal Exam: Shows otherwise no obvious major joint deformities.  Neurologic Exam: She has decreased sensation to light touch in both feet.  Skin: Otherwise has no open ulcers or rashes except as  mentioned above.  She does have a well-healed saphenectomy site in her right leg.  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  She has severely calcified vessels.  She has early gangrenous changes of her left second toe.  I agree with Dr. Lajoyce Corners that she needs to have an amputation of the left second toe.  However, I have some concerns that this may not heal without revascularization.  In light of this, we have scheduled her for an arteriogram, lower extremity runoff, possible angioplasty and stenting on Friday, Jul 09, 2010.  We will contact her dialysis center to have her dialysis session moved to Saturday, May 19th, so that we can do her arteriogram on Friday.  If she is not a candidate for a stent, we would probably need to consider doing a bypass procedure prior to her toe amputation and will probably be able to schedule this in the following week.  Risks, benefits, possible complications and procedure details including but not limited to bleeding, infection, vessel injury were explained to the patient today.  She understands and agrees to proceed.    Janetta Hora. Fumi Guadron, MD Electronically Signed  CEF/MEDQ  D:  07/08/2010  T:  07/08/2010  Job:  [id number]  cc:   Nadara Mustard, MD Jethro Bastos, M.D.

## 2010-07-09 ENCOUNTER — Ambulatory Visit (HOSPITAL_COMMUNITY)
Admission: RE | Admit: 2010-07-09 | Discharge: 2010-07-09 | Disposition: A | Discharge status: Home / Self Care | Payer: PRIVATE HEALTH INSURANCE | Source: Ambulatory Visit | Attending: Vascular Surgery | Admitting: Vascular Surgery

## 2010-07-09 DIAGNOSIS — L98499 Non-pressure chronic ulcer of skin of other sites with unspecified severity: Secondary | ICD-10-CM | POA: Insufficient documentation

## 2010-07-09 DIAGNOSIS — I739 Peripheral vascular disease, unspecified: Secondary | ICD-10-CM | POA: Insufficient documentation

## 2010-07-09 DIAGNOSIS — L97509 Non-pressure chronic ulcer of other part of unspecified foot with unspecified severity: Secondary | ICD-10-CM | POA: Insufficient documentation

## 2010-07-09 LAB — POCT I-STAT, CHEM 8
BUN: 51 mg/dL — ABNORMAL HIGH (ref 6–23)
Calcium, Ion: 1.07 mmol/L — ABNORMAL LOW (ref 1.12–1.32)
Chloride: 104 mEq/L (ref 96–112)
Creatinine, Ser: 8.5 mg/dL — ABNORMAL HIGH (ref 0.4–1.2)
Glucose, Bld: 52 mg/dL — ABNORMAL LOW (ref 70–99)
HCT: 41 % (ref 36.0–46.0)
Hemoglobin: 13.9 g/dL (ref 12.0–15.0)
Potassium: 5.7 mEq/L — ABNORMAL HIGH (ref 3.5–5.1)
Sodium: 133 mEq/L — ABNORMAL LOW (ref 135–145)
TCO2: 25 mmol/L (ref 0–100)

## 2010-07-09 LAB — GLUCOSE, CAPILLARY
Glucose-Capillary: 122 mg/dL — ABNORMAL HIGH (ref 70–99)
Glucose-Capillary: 109 mg/dL — ABNORMAL HIGH (ref 70–99)
Glucose-Capillary: 67 mg/dL — ABNORMAL LOW (ref 70–99)

## 2010-07-09 NOTE — Op Note (Signed)
[facility]  Patient:    Faith [name], Faith [name]                     MRN: [id number] Proc. Date: 09/01/99 Adm. Date:  [id number] Attending:  Alyson Locket                           Operative Report  PREOPERATIVE DIAGNOSIS:  End-stage renal disease.  POSTOPERATIVE DIAGNOSIS:  End-stage renal disease.  PROCEDURE:  Placement of new left forearm loop arteriovenous Gore-Tex graft.  SURGEON:  Larina Earthly, M.D.  ASSISTANT:  Loura Pardon, P.A.  ANESTHESIA:  Lidocaine 0.5% with epinephrine local and IV sedation.  COMPLICATIONS:  None.  DISPOSITION:  To recovery, stable.  PROCEDURE IN DETAIL:  The patient was taken to the operating room and placed in supine position where the area of the left arm was prepped and draped in the usual sterile fashion.  Using local anesthesia, an incision was made over the antecubital space and carried down to isolate the brachial artery and cephalic vein.  The vein was of good caliber at the antecubital space.  The vein was opened after occluding it proximally and distally and there was some sclerotic area in the vein above the antecubital space and therefore, it was felt to be adequate for outflow for a graft but not for a fistula.  A separate incision was made using local anesthesia over the distal forearm and a loop-configuration tunnel was created in the forearm.  A 6-mm standard wall stretch Gore-Tex was brought through the tunnel.  The carotid vein was occluded proximally and distally and was opened with an 11 blade and extended longitudinally with Pott scissors.  The graft was cut to the appropriate length and was sewn end-to-side to the vein with a running 6-0 Prolene suture. Clamps were removed from the vein, the graft was flushed with heparinized saline and reoccluded.  Next, the brachial artery was occluded proximally and distally and was opened with an 11 blade and extended longitudinally with Pott scissors.  The  graft was cut to the appropriate length and was sewn end-to-side to the artery with a running 6-0 Prolene suture.  A small arteriotomy was created.  The clamps were removed and excellent thrill was noted.  The wound was irrigated with saline and hemostased with electrocautery.  Wounds were closed with 3-0 Vicryl in the subcutaneous and subcuticular tissues and Benzoin and Steri-Strips were applied.  DD:  09/01/99 TD:  09/01/99 Job: [id number]

## 2010-07-09 NOTE — Consult Note (Signed)
[place]  Patient:    Faith [name], Faith [name]                     MRN: [id number] Proc. Date: 08/06/00 Adm. Date:  [id number] Attending:  Garnetta Buddy                          Consultation Report  HISTORY OF PRESENT ILLNESS:  The patient is a 55 year old woman admitted to the renal service who has symptomatic cholelithiasis, possible acute cholecystitis.  The patient has been ill for several weeks.  She has had continuous nausea and vomiting for the past two weeks with no fevers or chills and no jaundice. Fatty foods tend to exacerbate her abdominal complaints.  She recently came into the ER where she was found to have gallstones on a recent ultrasound and evidence of some mild thickening.  A surgical consultation was obtained.  PAST MEDICAL HISTORY:  Non-insulin-dependent diabetes mellitus, hypertension, and end-stage renal disease for which she is getting hemodialysis through an Ash catheter placed two weeks ago.  MEDICATIONS: 1. Norvasc. 2. Toprol. 3. Nephro-Vite. 4. Avandia. 5. Glyburide.  ALLERGIES:  She has no known drug allergies.  PAST SURGICAL HISTORY:  Vascular grafts.  She has had laparoscopic procedures for tubal problems many years ago.  PHYSICAL EXAMINATION:  VITAL SIGNS:  She is afebrile.  Other vital signs are stable.  HEENT:  She is normocephalic, atraumatic, and anicteric.  NECK:  Supple.  CHEST:  Clear.  ABDOMEN:  Tender in the epigastrium and right upper quadrant but mostly in the epigastrium.  PELVIC:  No pelvic exam was done.  RECTAL:  Normal.  LABORATORY DATA:  She has a mildly elevated AST with normal bilirubin.  White count is over 10,000.  She does have a left shift.  She is anemic with a hemoglobin of approximately 8.5, hematocrit of 26%.  IMPRESSION:  End-stage renal disease with anemia and a symptomatic cholelithiasis.  PLAN:  The plan is to take the patient to the operating room for  a laparoscopic cholecystectomy, possible open procedure, and possible cholangiogram. DD:  08/06/00 TD:  08/06/00 Job: [id number]

## 2010-07-09 NOTE — Op Note (Signed)
[name], Faith [name]              ACCOUNT NO.:  [id number]   MEDICAL RECORD NO.:  [id number]          PATIENT TYPE:  OIB   LOCATION:  2550                         FACILITY:  [facility]   PHYSICIAN:  Beulah Gandy. Ashley Royalty, M.D. DATE OF BIRTH:  05/02/1955   DATE OF PROCEDURE:  04/25/2006  DATE OF DISCHARGE:                               OPERATIVE REPORT   ADMISSION DIAGNOSIS:  Proliferative diabetic retinopathy, vitreous  hemorrhage, posterior membranes, left eye.   PROCEDURE:  Pars plana vitrectomy with 25-gauge system, pan retinal  photocoagulation, membrane peel, and gas injection, left eye.   SURGEON:  Alan Mulder, M.D.   ASSISTANT:  Rosalie Doctor, M.A.   ANESTHESIA:  General.   DETAILS:  The usual prep and drape, 25-gauge trocars at 10, 2, and 4  o'clock, infusion port at 4 o'clock.  The lighted pick and the cutter  were placed at 10 at 2 o'clock, respectively.  Provisc placed on the  corneal surface.  Biome viewing system moved into place.  Pars plana  vitrectomy was begun just behind the crystalline lens.  Blood and  vitreous were encountered.  This was carefully removed under low suction  and rapid cutting down to the macular surface where a preretinal  membrane was seen between the disk and the macula.  The membrane was  peeled free from its attachments to the disk and macula with the cutter  and the lighted pick.  The vitrectomy was carried out to the periphery  where large clots of blood were encountered and removed.  The vitreous  base was trimmed for 360 degrees.  The endolaser was positioned in the  eye, 882 burns were placed around the retinal periphery with a power of  1000 milliwatts, 1000 microns each and 0.1 seconds each.  A washout  procedure was performed.  A 30% gas fluid exchange was then performed.  The instruments were removed from the eye and the wounds were held until  tight.  Polymyxin and gentamicin were irrigated into tenon space.  Atropine solution was  applied.  Marcaine was injected around the globe  for postoperative pain.  Decadron 10 mg was injected to the lower  subconjunctival space.  TobraDex ophthalmic ointment and a patch and  shield were placed.  Closing pressure was 10 with a Risk manager.  Complications none.  Duration 30 minutes.  The patient was awakened and  taken to recovery in satisfactory condition.      Beulah Gandy. Ashley Royalty, M.D.  Electronically Signed     JDM/MEDQ  D:  04/25/2006  T:  04/25/2006  Job:  [id number]

## 2010-07-09 NOTE — Op Note (Signed)
NAMESARABI, SOCKWELL              ACCOUNT NO.:  [id number]   MEDICAL RECORD NO.:  [id number]          PATIENT TYPE:  AMB   LOCATION:  SDS                          FACILITY:  [facility]   PHYSICIAN:  Janetta Hora. Fields, MD  DATE OF BIRTH:  09-Jun-1955   DATE OF PROCEDURE:  08/04/2005  DATE OF DISCHARGE:                                 OPERATIVE REPORT   PROCEDURE:  Right brachiocephalic AV fistula.   PREOPERATIVE DIAGNOSIS:  Renal failure.   POSTOPERATIVE DIAGNOSIS:  Renal failure.   ANESTHESIA:  Local with IV sedation.   ASSISTANT:  Coral Ceo, PA - C.   OPERATIVE FINDINGS:  1.  3-mm cephalic vein.   OPERATIVE DETAIL:  Obtaining informed consent, the patient taken to the  operating room.  The patient placed supine position operating table.  After  adequate sedation, the patient's entire right upper extremity prepped and  draped usual sterile fashion.  Local anesthesia was infiltrated near the  antecubital crease.  Transverse incision was made in this location, carried  down through subcutaneous tissues down to level cephalic vein.  There was  some inflammatory reaction around the cephalic vein and it was slightly  sclerotic but of good diameter.  Vein was dissected free circumferentially  for approximately 3 cm.  Next, brachial artery was dissected free in the  medial portion of the incision.  This controlled proximally and distally  with vessel loops.  The patient was then given 3000 units of intravenous  heparin.  Longitudinal arteriotomy was made with control of brachial artery.  Distal cephalic vein was suture ligated with 3-0 silk tie and transected.  The vein was probed and found to accept up to a 3-1/2 dilator.  There was  some area that was fairly sclerotic but there was good flow through the vein  with flushing with heparinized saline.  Vein was controlled proximally with  fine bulldog clamp and swung over to the level of the arteriotomy.  Vein was  then sewn end of vein  to side of artery using a running 7-0 Prolene suture.  Just prior to completion anastomosis, this was fore bled, back bled, and  thoroughly flushed.  Anastomosis was secured, clamps released.  There was  palpable thrill in the fistula immediately.  Hemostasis was obtained with 30  mg of protamine and direct pressure.  Subcutaneous tissues were  reapproximated using running 3-0 Vicryl suture.  Skin was closed with 4-0  Vicryl subcuticular stitch.  The patient tolerated procedure well and there  were no complications.  Instrument sponge and needle counts correct at end  of the case.  The patient taken to recovery room in stable condition.      Janetta Hora. Fields, MD  Electronically Signed    CEF/MEDQ  D:  08/04/2005  T:  08/04/2005  Job:  [id number]

## 2010-07-09 NOTE — Discharge Summary (Signed)
Faith [name]. [facility]  Patient:    Faith [name], BECHT Visit Number: [id number] MRN: [id number]          Service Type: [facility] Location: [id number] Attending Physician:  Faith Rogue T Dictated by:   Dian Situ, PA Admit Date:  09/19/2000 Discharge Date: 10/03/2000   CC:         Duke Salvia. Eliott Nine, M.D.  Dr. Toni Amend, M.D.  Gwenith Daily Tyrone Sage, M.D.  Wilhemina Bonito. Eda Keys., M.D. [facility]   Discharge Summary  DISCHARGE DIAGNOSES: 1. Deconditioning past coronary artery bypass graft. 2. 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 3. End-stage renal disease. 4. Postoperative anemia. 5. Esophageal stricture. 6. Heme-positive stool. 7. Situational depression.  HISTORY OF PRESENT ILLNESS:  Ms. Faith [name] is a 55 year old female with history of diabetes mellitus, end-stage renal disease, recent CVA, admitted to [facility] August 28, 2000, with left substernal epigastric pain and dyspnea with respiratory arrest in ED. She was intubated and noted to have flash pulmonary edema with non-Q-wave MI.  She was stabilized and cardiac catheterization was done showing three vessel disease.  She did require thrombectomy with revision of left arm AV graft.  On September 05, 2000, she underwent CABG x 3 by Ramon Dredge B. Tyrone Sage, M.D.  Postop, was noted to have some anxiety and lethargy.  Mood is noted to be depressed and she was started on Zoloft.  She was to have a short course of Tequin for bronchitis.  Her p.o. intake is noted to be poor.  She is currently noted to be deconditioned with unsteady gait, has ambulated with +2 assist with rolling walker.  Also noted to have heme-positive stools with question of colonoscopy on outpatient basis. CIR consulted for progressive independence goals.  PAST MEDICAL HISTORY:  Significant for diabetes mellitus with end-stage renal disease, dialysis since November 2001,  coronary artery disease, GERD, hyperlipidemia, laparoscopic cholecystectomy and secondary hyperparathyroidism.  ALLERGIES:  CODEINE.  SOCIAL HISTORY:  The patient is married and lives with husband in a one-level home with two to three steps at entry. She was independent but disabled for the past two years.  She does not use any tobacco or alcohol.  HOSPITAL COURSE:  Ms. Faith [name] was admitted to rehab on September 19, 2000, for inpatient therapies to consist of PT and OT daily.  Past admission, the patient was maintained on aspirin for CVA prophylaxis.  Hemodialysis has been ongoing per renal service.  She was treated with Epogen and InfeD for her anemia.  Stool guaiacs were done showing the patient to have heme-positive stools and GI was consulted for work-up.  The patient was evaluated by Wilhemina Bonito. Eda Keys., M.D. and underwent EGD on September 22, 2000, which showed stricture stenosis in distal esophagus.  They recommended continuing Protonix with GI follow-up p.r.n.   Her H&H has been stable overall.  Last CBC of October 02, 2000, shows hemoglobin 10.9, hematocrit 33.1, white count 8.0, platelets 387.  Electrolytes show sodium 134, potassium 4.6, chloride 95, CO2 27, BUN 38, creatinine 8.2, glucose 120. Iron studies done showed iron at 42, TIBC at 219, iron saturation is 90 and ferritin at 1506.  The patient was noted to have occasional bouts of lability and Gladstone Pih, M.D., neuropsych was consulted for input.  He felt that the patient was having an adjustment reaction, decreased mood secondary to her feelings of helpless with multiple medical problems as well as discouragement regarding  her health. She denied loss of interest, signs of anxiety or vegetative thoughts.  Supportive counseling one to two times a week was indicated and the patient has been followed along for this for her mood by psych during her stay. The patient has had much complaints of nausea.  Reglan was added  without much improvement. Part of this nausea has been exacerbated with constipation issue.  She has had problems tolerating sorbitol which tends to exacerbate her nausea additionally.  Phenergan p.r.n. was used with improvement in her symptomatology.  She was taken off Zoloft as a question that this could be exacerbating her symptomatology.  On October 02, 2000, the patient was known to spike a temperature of 100.3. Blood cultures x 3 and UA/UCS were done and sent out that showed no growth. Chest x-ray was done showing increased air space opacity and fluid in the left representing pneumonia with associated peripneumonic pleural effusion all combination of increasing pleural fluid with associated atelectasis.  She was started on Levaquin for this and was discharged to complete two weeks total of antibiotic therapy.  At the time of discharge the patient was at supervision to min. assist for transfers, supervision to min assist ambulating 100 feet with a rolling walker. She required some assist with set-up to supervision for ADL needs.  Home health PT and OT to continue past discharge, 24-hour assistance is required past discharge and the patients husband is to continue to provide this.  On October 03, 2000, the patient is discharged to home.  DISCHARGE MEDICATIONS:  1. Sorbitol 30 cc p.o. per day.  2. Enteric coated aspirin 325 mg per day.  3. Avandia 4 mg b.i.d.  4. Nephro-Vite one per day.  5. Reglan 5 mg a.c. and h.s.  6. Glucotrol 10 mg b.i.d.  7. Remeron 50 mg q.h.s.  8. Toprol XL 12.5 mg per day.  9. InfeD 100 mg IV on days with dialysis. 10. Protonix 40 mg per day. 11. Epogen 24,000 units with hemodialysis. 12. Levaquin 100 mg per day x 2 weeks.  13. Percocet one p.o. q.4-6h. p.r.n. chest wall pain. 14. Phenergan 25 mg suppository q.8h. p.r.n.  ACTIVITY:  She will need 24-hour supervision assistance.  DIET:  A diabetic diet with renal restrictions, 1200 cc fluid  restriction.  SPECIAL INSTRUCTIONS:  No alcohol, no smoking, no driving. Home health PT and OT by [facility].  FOLLOW-UP:  This is with Ulyess Mort, M.D., Dr. Karilyn Cota, Dr. Anne Hahn, Ricki Rodriguez, M.D. in the next few weeks.  Follow-up with Faith Rogue, M.D., October 31, 2000, at 10 a.m. Dictated by:   Dian Situ, PA Attending Physician:  Faith Rogue T DD:  11/08/00 TD:  11/08/00 Job: [id number]

## 2010-07-09 NOTE — H&P (Signed)
Faith [name], Faith [name] NO.:  [id number]   MEDICAL RECORD NO.:  [id number]          PATIENT TYPE:  INP   LOCATION:  1829                         FACILITY:  [facility]   PHYSICIAN:  Wilber Bihari. Caryn Section, M.D.   DATE OF BIRTH:  September 26, 1955   DATE OF ADMISSION:  09/27/2005  DATE OF DISCHARGE:                                HISTORY & PHYSICAL   REASON:  Nausea, vomiting and fever.   HISTORY OF PRESENT ILLNESS:  This is a 55 year old African American female  with an extensive past medical history that includes end-stage renal disease  (hemodialysis Monday, Wednesday and Fridays at the [facility]).  She presented to the emergency room this morning complaining of  approximately a 48-hour history of nausea and vomiting.  The patient awoke  this morning with a temperature and rigors and was brought to the emergency  room by her husband.  The patient husband states that over the weekend the  patient visited with some family members who had some type of virus.  The  patient and husband are unsure if this was a respiratory or GI virus.  The  patient began having some nausea and vomiting Monday which has gradually  worsened and now she presents with an elevated temperature.  The patient's  husband states that she also fell at home Sunday, which is rare for her.  The patient landed on her back and left shoulder.  She does have some  soreness but denies any fractures or ulcers.   PAST MEDICAL HISTORY:  As stated above as well as diabetes mellitus type 2,  coronary artery disease status post three-vessel CABG in July 2002.  History  of cardiac arrest in July 2002 prior to her coronary artery bypass grafting,  peripheral vascular disease, right brain CVA with left hemiparesis, anemia,  secondary hyperparathyroidism, laparoscopic cholecystectomy, anxiety slash  depression, adult acne, polyneuropathy, pulmonary embolism in May 2005.  The  patient completed her course of  Coumadin in October 2006.   ALLERGIES:  FOSRENOL which produces nausea.  LIPITOR, ZOCOR and Niaspan  which produces myositis.   MEDICATIONS:  Aspirin 81 mg daily, Gabapentin 300 mg p.o. q.h.s., glipizide  XL 10 mg p.o. b.i.d., Reglan 5 mg a.c. and h.s., Renagel 800 mg four p.o.  t.i.d. with meals and two with snacks,  Sensipar 90 mg 1 p.o. daily, Zetia  10 mg one p.o. q.h.s., Qualaquin 324 mg b.i.d. p.r.n., AcipHex daily and  Plavix 75 mg daily.   SOCIAL HISTORY:  The patient lives in a house with her husband.  They have  one son who is 89 years old, he is alive and well.  Denies any alcohol,  tobacco or illicit drug use.  She has a twelfth grade education.   FAMILY HISTORY:  The patient's mother is deceased in her 35s secondary to  cirrhosis of the liver.  The patient's father is deceased at age 49  secondary to diabetes mellitus and coronary artery disease.  The patient has  three sisters all of which are in their 2s, two of which are diabetics, one  is insulin dependent and the other sister has hypertension.   REVIEW OF SYSTEMS:  Generalized, the patient has had fever, chills and  sweating as stated above.  The patient has also had decreased appetite and  increased fatigue in the last 48 hours as well.  HEENT: The patient has a  slight frontal headache that since this a.m. which is new.  The patient  denies any rhinorrhea, photophobia, vision or hearing loss. DERMATOLOGICAL:  The patient denies any rashes or lesions.  She does have a left cheek ulcer  per the husband. CARDIOPULMONARY:  The patient does have a cough which is  new and has been progressing over the last few weeks.  It is nonproductive.  The patient denies chest pain, shortness of breath, dyspnea on exertion,  edema, palpitations, claudication, or wheezing.  ENDOCRINE:  The patient  denies polyuria, dipsia heat or cold intolerance.  GU: The patient denies  frequency, urgency, dysuria, hematuria, nocturia or menopause.   NEUROPSYCH:  The patient again has had some weakness that progressed over the weekend  since she began feeling bad.  She did fall as stated above in HPI.  Denies  any depression or anxiety. MUSCULOSKELETAL:  Denies any myalgias or  arthralgias, joint swelling, deformity or pain.  GASTROINTESTINAL:  The  patient has had some nausea, vomiting as stated above. The patient denies  any diarrhea, bright red blood per rectum, melena, hematemesis, dysphagia,  odynophagia, abdominal pain or changes in the patient's bowel habits.   PHYSICAL EXAM:  Temperature 100.4, pulse 102, respirations 18, blood  pressure 144/59, oxygen saturation 95% on room air.  GENERAL:  This is a lethargic African American female who appears stated  age.  She awakened easily to verbal stimuli and answers questions  appropriately.  HEENT: Head is normocephalic and atraumatic.  EOMs are intact.  The patient  has bilateral 3+ dark cerumen noted in bilateral ear canals.  Nares are  patent without drainage or edema.  Mucous membranes are moist.  Dentition is  in poor condition.  Oropharynx is without erythema or exudate.  NECK:  Is supple without lymphadenopathy, thyromegaly, bruit or JVD.  The  patient does have a pin-point size dark sore noted on her left cheek.  A  scab is present.  There is no redness, drainage noted.  The patient does  have some firmness noted around that pinpoint ulcer that is approximately  nickel size.  The patient denies any pain with palpation.  CARDIOVASCULAR:  Heart rate is tachycardic with S1-S2 intact, pulses are 1+  and regular and has no murmurs, rubs, gallops, or clicks noted.  RESPIRATORY:  Poor effort.  The patient does have decreased breath sounds  noted in bilateral lower lobes.  She is otherwise clear.  No wheezes, rubs  or rhonchi or rales or rhonchi noted.  DERMATOLOGICAL:  No rashes noted.  The patient does have some facial acne and the pin-point sized sore as stated in the HEENT  section.  GASTROINTESTINAL:  Abdomen is soft, obese, nontender, nondistended.  Bowel  sounds are present in all four quadrants.  No rebound found or guarding  noted.  EXTREMITIES: No cyanosis, clubbing or edema present.  No new rashes, lesions  or petechiae present.  HEMODIALYSIS ACCESS:  The patient has a left upper extremity AV graft with  positive thrill and bruit.  The patient has a right upper extremity AV  fistula with positive thrill and bruit as well as next.  MUSCULOSKELETAL: No joint deformity effusions  or spinal tenderness.  NEUROLOGICAL:  The patient is oriented x3 as stated above.  She is slightly  lethargic, however, she awakens easily to verbal stimuli.  The patient does  have new bilateral upper extremity slight intention tremors, that have began  this morning per the husband.  RADIOLOGICAL EXAMINATIONS:  One-view chest x-ray revealed no acute findings.   LABORATORY DATA:  WBC 10.1, hemoglobin 13.6, hematocrit 41.1, platelets 268.  Sodium 131, potassium 5.1, chloride 99, BUN 26, glucose 146, pH 7.511, pCO2  37.7, bicarb 30.2, pCO2 31.   ASSESSMENT/PLAN:  1.  Nausea, vomiting and fevers with questionable source.  The patient will      be pancultured and chest x-ray was performed as stated above.  We will      begin antibiotic therapy empirically and await culture results.  2.  New bilateral upper extremity intention tremors with history of CVA.      The patient's Reglan will be d/c'd at this time given the potential EPS      side effects. Head CT scan will be performed as a precaution without      contrast.  3.  End-stage renal disease.  The patient is a hemodialysis patient Monday,      Wednesdays and Fridays at the [facility].  The      patient received a full treatment yesterday and will be scheduled for      hemodialysis tomorrow in order to attempt to keep the patient on her      outpatient regimen.  4.  Anemia.  The patient will be continued  on low dose Epogen therapy during      her hospital stay.  We will monitor hemoglobin, hematocrit throughout      her hospitalization and adjust medications as needed.  5.  Secondary hyperparathyroidism.  The patient will be continued on low      dose vitamin D therapy per outpatient regimen.  The patient's phosphate      binders will be currently held secondary to the patient's vomiting.      They will be restarted once the patient is tolerating her p.o. diet      without difficulty.  6.  Diabetes mellitus.  The patient will be placed on Accu-Cheks a.c. and      h.s. with sensitive sliding scale insulin.  Her oral glycemic agent will      be initiated once the patient is with a good p.o. intake.   DISPOSITION:  The patient will return home at the time of discharge with her  husband.      Tracey P. Sherrod, NP    ______________________________  Wilber Bihari. Caryn Section, M.D.   TPS/MEDQ  D:  09/27/2005  T:  09/27/2005  Job:  [id number]

## 2010-07-09 NOTE — Op Note (Signed)
[place]. [facility]  Patient:    Faith [name], Faith [name]                     MRN: [id number] Proc. Date: 08/07/00 Adm. Date:  [id number] Attending:  Garnetta Buddy CC:         Dr. Elvis Coil   Operative Report  PREOPERATIVE DIAGNOSIS:  Symptomatic cholelithiasis.  POSTOPERATIVE DIAGNOSIS:  Symptomatic cholelithiasis.  OPERATION PERFORMED:  Laparoscopic cholecystectomy.  SURGEON:  Jimmye Norman, M.D.  ASSISTANT:  Abigail Miyamoto, M.D.  ANESTHESIA:  General endotracheal.  ESTIMATED BLOOD LOSS:  Less than 30 cc.  COMPLICATIONS:  None.  CONDITION:  Stable.  INDICATIONS FOR PROCEDURE:  The patient is a 55 year old end-stage renal disease patient with non-insulin-dependent diabetes and hypertension, who has gallstones and pain of epigastric and right upper quadrant pain.  OPERATIVE FINDINGS:  The patient had a mildly inflamed gallbladder with adhesions to the duodenum.  There was no evidence of perforation.  DESCRIPTION OF PROCEDURE:  The patient was taken to the operating room and placed on the table in supine position.  After an adequate general anesthetic was administered, she was prepped and draped in the usual sterile fashion exposing the midline and the right upper quadrant of the abdomen.  Initially, a superumbilical curvilinear incision was made using a #11 blade. We subsequently dissected down to the midline fascia through which a Veress needle was passed into the peritoneal cavity and confirmed to be inadequate position using the saline test.  Once this was done, carbon dioxide insufflation was instilled into the peritoneal cavity up to a maximum intra-abdominal pressure of 15 mmHg.  Once this was done, an 11 mm cannula was passed through the superumbilical fascia into the peritoneal cavity and confirmed to be in position with the laparoscope and attached camera and light source.  Subsequently, two right costal margin 5 mm cannulas and a  subxiphoid 10-11 mm cannula were placed into the peritoneal cavity under direct vision. With all cannulas in place, the patient was placed in steeper reversed Trendelenburg position.  The left side was tilted down and the dissection begun.  The gallbladder was grasped at the dome and retracted towards the anterior abdominal wall and then to the right upper quadrant.  There were adhesions to the second portion of duodenum which were taken down using electrocautery and blunt dissection.  This exposed the hepatoduodenal triangle and the triangle of Calot.  The peritoneum overlying the triangle of Calot was incised using electrocautery and then we subsequently dissected out the cystic duct.  The cystic artery apparently coursed along with the cystic duct and was clipped doubly along with the cystic duct.  There was no separate arterial structure which was endoclipped and was never seen during the course of the procedure. We dissected the gallbladder out of its bed using a spatula dissector with minimal bleeding.  The gallbladder was removed using an Endocatch bag from the superumbilical site.  There was a small entrance into the gallbladder during the procedure dissecting out of its bed with small stones being extruded; however, most of these were removed.  Once we had removed the gallbladder, we irrigated with a liter of saline, removed most of this bile that had spilled and then aspirated most of the gas out of the peritoneal cavity.  We then closed the superumbilical fascial site using a figure-of-eight stitch of 0 Vicryl and the skin was closed using running subcuticular stitch of 4-0 Vicryl.  0.25% Marcaine  with epinephrine was injected at all sites.  Sponge, needle and instrument counts were correct. D:  08/07/00 TD:  08/07/00 Job: [id number]

## 2010-07-09 NOTE — Op Note (Signed)
Faith [name], Faith [name] NO.:  [id number]   MEDICAL RECORD NO.:  [id number]          PATIENT TYPE:  EMS   LOCATION:  MAJO                         FACILITY:  [facility]   PHYSICIAN:  Janetta Hora. Fields, MD  DATE OF BIRTH:  Oct 22, 1955   DATE OF PROCEDURE:  11/10/2005  DATE OF DISCHARGE:                                 OPERATIVE REPORT   PROCEDURE:  Right forearm arteriovenous graft.   PREOPERATIVE DIAGNOSIS:  End-stage renal disease.   POSTOPERATIVE DIAGNOSIS:  End-stage renal disease.   ANESTHESIA:  Local with IV sedation.   ASSISTANT:  Nurse   OPERATIVE FINDINGS:  1. A 3 mm deep brachial vein.  2. A 6 mm polytetrafluoroethylene graft.   DESCRIPTION OF PROCEDURE:  After obtaining an informed consent, the patient  was taken to the operating.  The patient placed  in the supine position on  the operating table.  After adequate sedation, the patient's entire right  upper extremity was prepped and draped in the usual sterile fashion.  Local  anesthesia was infiltrated at a pre-existing scar at the antecubital crease.  An incision was made in this location and carried down through the  subcutaneous tissues, down to level of a pre-existing brachiocephalic AV  fistula.  The vein was dissected free circumferentially.  The brachial  artery was then controlled proximal to the fistula.  The ulnar and radial  arteries were then dissected free just distal to the fistula.  These were  controlled with Vesi-loops.  The adjacent deep brachial vein was inspected  and found to be sclerotic and of poor quality.  Therefore, local anesthesia  was infiltrated just above the elbow.  A longitudinal incision made in this  location and carried down through the subcutaneous tissues, down to the  level of the brachial vein.  This was approximately 3 mm in diameter in this  location.  It was dissected free circumferentially and small side branches  ligated and divided between silk ties.  Local  anesthesia was infiltrated in  the distal forearm.  A transverse incision was made in this location for  assistance in tunneling.  A 6-mm PTFE graft was then brought up in the  operative field and placed in a subcutaneous loop configuration with the  arterial limb of the graft on the radial aspect of the forearm.  The patient  was then given 5000 units of intravenous heparin.  Vesi-loops were pulled  taut on the artery.  A longitudinal arteriotomy was made.  The graft was  beveled and sewn end-to-graft, to side-of-artery using a running #6-0  Prolene suture.  The old fistula was completely taken down and ligated  distally.  Prior to the completion of the anastomosis this was fore-bled,  back-bled  and thoroughly flushed.  The anastomosis was secured.  The clamps  were released. There was a good pulse within the graft.  The graft was then  clamped in the upper arm incision.  The vein was then controlled proximally  and distally with fine bulldog clamps.  A longitudinal venotomy was made.  The graft was beveled and sewn end-to-graft  to side-of-vein using running #6-  0 Prolene suture.  Just prior to completion of the anastomosis this was fore-  bled, back-bled and thoroughly flushed.  The anastomosis was secured.  Clamps were released.  There was a palpable thrill in the graft immediately.  Next hemostasis was obtained.  The subcutaneous tissues of all three  incisions were closed with running #3-0 Vicryl suture.  The skin of all  three incisions then closed with #4-0 Vicryl subcuticular stitch.  The  patient had a weakly palpable radial pulse which augmented with compression  of the graft.  There was a good radial Doppler signal.   The patient tolerated the procedure well and there were no complications.  The sponge and needle counts were correct at the end of case.  The patient  taken to recovery room in stable condition.      Janetta Hora. Fields, MD  Electronically Signed      CEF/MEDQ  D:  11/10/2005  T:  11/11/2005  Job:  [id number]

## 2010-07-09 NOTE — Discharge Summary (Signed)
[place]. [facility]  Patient:    Faith [name], Faith [name]                     MRN: [id number] Adm. Date:  [id number] Disc. Date: [id number] Attending:  Cathren Laine Dictator:   Sheffield Slider, P.A.C. CC:         Jimmye Norman, M.D.  [facility]   Discharge Summary  DISCHARGE DIAGNOSES: 1. Cholecystitis, status post laparoscopic cholecystectomy.2 2. Pulmonary edema, resolved. 3. Right base infiltrate. 4. Type 2 diabetes mellitus. 5. Coronary artery disease with history of stent placement. 6. Hypertension. 7. End-stage renal disease on chronic hemodialysis.  PROCEDURES: 1. Laparoscopic cholecystectomy, Dr. Jimmye Norman, on August 07, 2000. 2. Hemodialysis. 3. Right upper quadrant ultrasound on August 06, 2000, that showed gallstones    without evidence of wall thickening and slightly small kidneys.  HISTORY OF PRESENT ILLNESS:  Faith [name] is a 55 year old, African-American female who presented in the emergency room on June 16, with nausea, vomiting and retrosternal chest pain radiating to the back for the past two weeks which has been intermittent in nature.  She has had some funny food inversions.  She states she has end-stage renal disease on chronic hemodialysis and recently had a clot in a right forearm graft.  The patient denies any shortness of breath, diaphoresis or syncopal spells.  She states the pain is usually relieved with Aleve or Bayer aspirin, but this now does not control it.  She presented to the emergency room for further relief of her pain.  LABORATORY DATA AND X-RAY FINDINGS:  EKG showed normal sinus rhythm.  Chest x-ray showed mild pulmonary edema.  Labs with white count 10,600, hemoglobin 8.9, hematocrit 26, platelets 508,000.  Sodium 138, potassium 3.5, chloride 97, CO2 31, BUN 27, creatinine 7.7, glucose 270, calcium 9.4, mildly increased AST.  CK 120, MB 0.05, troponin 0.2.  HOSPITAL COURSE:  The patient was  admitted to the renal unit.  Dr. Lindie Spruce was called to evaluate the patient for possible cholecystectomy.  She was placed on her usual medications and diet and a laparoscopic cholecystectomy was planned for June 17.  She was dialyzed on June 16, for her pulmonary edema and again on June 17, following her surgery.  Follow up chest x-ray on June 17, prior to her second dialysis showed mild pulmonary edema with increasing right lower lobe air space disease.  For this, she was started on some empiric Tequin.  She had no significant fever and her white count was 14,100 with a normal differential.  The patient had good bowel sounds following her surgery. She was evaluated by Dr. Lindie Spruce on June 18, who felt she was okay for discharge.  On the morning of June 18, she was quite sluggish and difficult to arouse presumably due to her pain medication.  She tolerated her lunch on June 18, and was able to be discharged home.  CONDITION ON DISCHARGE:  Improved.  DISCHARGE MEDICATIONS:  1. Vicodin one to two tablets every four to six hours as needed, #30     dispensed.  2. Niaspan 1500 mg per day.  3. Nephro-Vite one tablet per day.  4. Nitrostat 0.4 mg sublingual.  5. Norvasc 10 mg per day.  6. Toprol XL 50 mg per day.  7. Avandia 4 mg b.i.d.  8. Glyburide 2.5 mg b.i.d.  9. Tequin 200 mg per day x 13 more days for a two-week total. 10. Enteric coated aspirin one  tablet per day.  DIET:  80 g protein, 2 g sodium, 2 g potassium diabetic diet.  Limited fluids to five cups per day.  WOUND CARE:  Remove dressings in two days.  May shower and pat wounds dry.  FOLLOWUP:  The patient is to follow up in two weeks with Dr. Lindie Spruce.  SPECIAL INSTRUCTIONS:  Increase Epogen dose to 14,000 units IV every hemodialysis.  Estimated dry weight 82 kg.  INFeD dose the same.  No heparin for one treatment and then heparin x 2 weeks, then resume standard heparin. DD: 08/16/00 TD:  08/16/00 Job: [id number]

## 2010-07-09 NOTE — Cardiovascular Report (Signed)
NAME:  Faith [name], Faith [name]                        ACCOUNT NO.:  [id number]   MEDICAL RECORD NO.:  [id number]                   PATIENT TYPE:  INP   LOCATION:  2028                                 FACILITY:  [facility]   PHYSICIAN:  Ricki Rodriguez, M.D.               DATE OF BIRTH:  09-14-1955   DATE OF PROCEDURE:  07/09/2003  DATE OF DISCHARGE:  07/11/2003                              CARDIAC CATHETERIZATION   PROCEDURE:  1. Left heart catheterization.  2. Selective coronary angiography.  3. Left ventricular function study.  4. Bypass graft study.   INDICATIONS:  This 55 year old black female had chest pain, shortness of  breath and abnormal nuclear stress test.   APPROACH:  Right femoral artery using 5 French sheath and catheters.   COMPLICATIONS:  None.  Smart needle was used for vascular access.   HEMODYNAMIC DATA:  The left ventricular pressure was 131/14 aortic pressure  was 131/69.   LEFT VENTRICULOGRAM:  The left ventriculogram showed normal left ventricular  systolic function with ejection fraction of 70%.   CORONARY ANATOMY:  The left main coronary artery was long with luminal  irregularities.   Left anterior descending coronary artery:  The left anterior descending  coronary artery had proximal 50% narrowing, then 95% stenosis followed by  100% stenosis.  The diagonal one was unremarkable.  Diagonal two had ostial  80% stenosis, but was less than 2 mm vessel and origin of the diagonal two  vessel was at 95% stenosis of the LAD.   Left circumflex coronary artery:  The left circumflex coronary artery was  essentially unremarkable.  Ramus branch had diffuse narrowing.  Obtuse  marginal branch one had mild proximal disease.  Obtuse marginal branch two  was very small and obtuse marginal branch three had competitive flow.   Right coronary artery:  The right coronary artery had ostial to proximal  total occlusion.   Saphenous vein graft to right coronary artery:  The  saphenous vein graft to  the right coronary artery had ostial 20%, proximal 50% long concentric  stenosis and it was unremarkable.   Saphenous vein graft to obtuse marginal branch:  The saphenous vein graft to  obtuse marginal branch was widely patent.  The obtuse marginal branch was  unremarkable with TIMI-3 flow.   LIMA to LAD:  Left internal mammary artery to LAD was widely patent and  filled LAD well with TIMI-3 flow.   IMPRESSION:  1. Multivessel native vessel coronary artery disease.  2. Moderate saphenous vein graft to right coronary artery disease.  3. Patent saphenous vein graft to obtuse marginal branch one and patent left     internal mammary artery to left anterior descending coronary artery.   RECOMMENDATIONS:  This patient will be treated medically for now.  Ricki Rodriguez, M.D.    ASK/MEDQ  D:  08/07/2003  T:  08/07/2003  Job:  [id number]

## 2010-07-09 NOTE — Op Note (Signed)
[facility]  Patient:    Faith [name], Faith [name]                     MRN: [id number] Proc. Date: 07/17/00 Adm. Date:  [id number] Attending:  Alyson Locket                           Operative Report  PREOPERATIVE DIAGNOSIS:  Chronic renal failure.  POSTOPERATIVE DIAGNOSIS:  Chronic renal failure.  PROCEDURES: 1. Thrombectomy of left forearm arteriovenous graft. 2. Insertion of new segment of graft to more proximal brachial vein.  SURGEON:  Di Kindle. Edilia Bo, M.D.  ASSISTANT:  Loura Pardon, P.A.  ANESTHESIA:  Local with sedation.  DESCRIPTION OF PROCEDURE:  The patient was taken to the operating room and sedated by anesthesia.  The left upper extremity was prepped and draped in the usual sterile fashion.  After the skin was infiltrated with 1% lidocaine, the incision over the venous anastomosis was opened and the venous limb of the graft dissected free.  Of note, the vein here was quite small, and I extended the incision further proximally and found a slightly larger vein; however, once this was ligated, I could not pass the 2 mm dilator up this.  I therefore had to extend the incision fairly high up the upper arm to find an adequate brachial vein for venous outflow.  This vein was ligated, and it did irrigate easily with heparinized saline.  Graft thrombectomy was then achieved using a #4 Fogarty catheter after the graft was divided.  The venous anastomosis was ligated, and the venous segment of the graft was excised.  The new segment of graft was spatulated and sewn end-to-end to the spatulated brachial vein using continuous 6-0 Prolene suture.  The new segment of graft was then anastomosed end-to-end to the old graft after it was cut to the appropriate length.  This was done with continuous 6-0 Prolene suture.  At completion, there was an excellent thrill in the graft.  Hemostasis was obtained in the wound, and the wound was closed with a deep  layer of 3-0 Vicryl and the skin closed with 4-0 Vicryl.  If this graft clots in the future, would recommend a new upper arm graft.  Of note, there was a small amount of fluid around the venous anastomosis, and a culture was sent intraoperatively. DD:  07/17/00 TD:  07/17/00 Job: [id number]

## 2010-07-09 NOTE — Discharge Summary (Signed)
NAME:  Faith [name], Faith [name]                        ACCOUNT NO.:  [id number]   MEDICAL RECORD NO.:  [id number]                   PATIENT TYPE:  INP   LOCATION:  2028                                 FACILITY:  [facility]   PHYSICIAN:  Ricki Rodriguez, M.D.               DATE OF BIRTH:  [birth year]   DATE OF ADMISSION:  07/04/2003  DATE OF DISCHARGE:  07/11/2003                                 DISCHARGE SUMMARY   PRINCIPAL/FINAL DIAGNOSES:  1. Pulmonary embolism.  2. Severe native vessel coronary artery disease.  3. Status post coronary artery bypass graft surgery.  4. Hypertension.  5. Diabetes mellitus, type 2.  6. Cerebrovascular accident.   DISCHARGE MEDICATIONS:  1. Coumadin 4 mg 1 p.o. daily.  2. Toprol-XL 25 mg 1 daily.  3. Protonix 40 mg 1 daily.  4. Quinine 260 mg 1 daily.  5. Renagel 800 mg 3 tablets 3 times daily.  6. Remeron 15 mg 1 daily.  7. Reglan 5 mg a.c. and h.s.  8. Nephro-Vite 1 p.o. daily.  9. Glucotrol XL 10 mg 1 twice daily.  10.      Actos 45 mg daily.  11.      Advicor 500/20 mg 1 daily.  12.      Lovenox 60 mg subcutaneously daily x2 days.   DISCHARGE DIET:  Low-fat, low-salt diet.   DISCHARGE ACTIVITY:  Discharge activity as tolerated.   WOUND CARE INSTRUCTIONS:  Patient to notify if right groin pain, swelling or  discharge.   CONDITION ON DISCHARGE:  Improved.   HISTORY:  This 55 year old black female presented with exertional dyspnea  and orthopnea of 2 weeks' duration without chest pain, however, she had some  nausea and has a past history of diabetes mellitus for 30 years,  hypertension for 20 years, she quit smoking 10 years ago, has history of  elevated cholesterol.  She had gallbladder surgery in 2002, angioplasty in  1998 and bypass graft surgery in 2002.   PHYSICAL EXAMINATION:  VITAL SIGNS:  Temperature 96.2, pulse 80,  respirations 20, blood pressure 134/64; height 5 feet 5 inches, weight 187  pounds.  GENERAL:  The patient was alert,  oriented x3.  HEENT:  Head normocephalic, atraumatic.  Eyes brown with sclerae white.  Ears/nose/throat:  Mucous membranes pink.  Patient was edentulous.  NECK:  No JVD.  LUNGS:  Lungs clear anteriorly with rare basilar crackles and a midline scar  of surgery.  HEART:  S1 and S2 normal with a 2/6 systolic murmur.  ABDOMEN:  Abdomen was soft.  EXTREMITIES:  No edema and left-sided weakness.  CNS:  Cranial nerves grossly intact with left lower extremity weakness more  than upper extremity.   LABORATORY DATA:  Laboratory data revealed a hemoglobin of 15.8, hematocrit  48.7, normal WBC count, normal platelet count; subsequent hemoglobin and  hematocrit remained stable throughout the hospital stay.  PT was 14.1 with  an  INR of 1.1; subsequent PT was 15.5 with an INR of 1.4.  Sodium 140,  potassium 4.8, BUN 15, creatinine 6.1, glucose elevated at 146; subsequent  glucoses were 72-123.  Liver enzymes slightly elevated at 53 and 55 on Jul 09, 2003.  CK-MB and troponin I were essentially unremarkable.   Chest x-ray revealed mild chronic bronchitic and interstitial changes.   CT angiogram showed a filling defect in the right segmental branches of the  pulmonary artery as well as one on the left, likely represent pulmonary  emboli.   Nuclear stress test revealed a small area of myocardial ischemia in mid-to-  distal inferolateral wall.   Cardiac catheterization showed multivessel native vessel coronary artery  disease with moderate saphenous-vein-graft-to-right-coronary-artery disease  and patent saphenous vein graft to obtuse marginal branch as well as LIMA to  left anterior descending coronary artery.   HOSPITAL COURSE:  Patient was admitted to telemetry unit.  She was started  on Lovenox and CT chest was ordered; she also received oxygen by nasal  cannula.  Her condition gradually improved over 48 hours.  She underwent  nuclear stress test that showed reversible ischemia in  anterolateral wall.  Her CT of the chest showed filling defect with strong possibility of  pulmonary embolism.  Her cardiac catheterization showed severe native vessel  coronary artery disease but patent bypass grafts.  She was started on  Coumadin and with her INR steadily improving, she was discharged home in  satisfactory condition with followup by me in 1-2 weeks and followup by  renal doctor in 1-2 weeks.                                                Ricki Rodriguez, M.D.    ASK/MEDQ  D:  08/28/2003  T:  08/29/2003  Job:  [id number]

## 2010-07-09 NOTE — Op Note (Signed)
[place]  Patient:    [name], [name]                     MRN: [id number] Adm. Date:  [id number] Attending:  Dayle Points CC:         Llana Aliment. Deterding, M.D.   Operative Report  PREOPERATIVE DIAGNOSIS:  Thrombosed left upper arm arteriovenous Gore-Tex graft.  POSTOPERATIVE DIAGNOSIS:  Thrombosed left upper arm arteriovenous Gore-Tex graft.  PROCEDURE:  Thrombectomy and patch angioplasty revision of venous end.  SURGEON:  D. Karle Plumber, M.D.  FIRST ASSISTANT:  Gina H. Collins, P.A.-C.  DESCRIPTION OF PROCEDURE:  After percutaneous insertion of all monitoring lines, the patient underwent general anesthesia, was prepped and draped in the usual sterile manner.  The left upper arm was infiltrated with 1% Xylocaine at the incision at the antecubital fossa as well as at the axillary fossa. Incision was made and carried down with electrocautery through the subcutaneous tissue, down to the Gore-Tex graft.  The Gore-Tex graft was dissected out as an end-to-end anastomosis, and then it was dissected down to the axillary vein.  There was a loop proximally and distally, then another area was made over the arterial anastomosis and dissection was carried down to the arterial anastomosis, and this was looped with a vascular tape.  There was some fluid around the graft, which was cultured.  The patient was given 4000 units of heparin, and a longitudinal incision was made in the graft and dissected up across the anastomosis at the venous end.  There was intimal hyperplasia there.  It was brought up approximately 1 cm up the vein until the vein was normal, when it was back to normal consistency and there was no venous intimal hyperplasia.  A Gore-Tex patch was cut in an oval-shaped patch. A proximal and distal thrombectomy was carried out with a #4 Fogarty catheter and then a 5 Fogarty catheter with first just fair inflow and then finally after  passing the catheter through the arterial anastomosis, there was fair to good inflow.  The graft was clamped and then the patch angioplasty performed in an oval shape with 6-0 Prolene in a running, continuous fashion on the venous end.  All clamps were removed, and there was good flow through the graft.  Wounds were closed with 3-0 Vicryl and Steri-Strips.  The patient returned to the recovery room in stable condition. DD:  08/31/00 TD:  08/31/00 Job: [id number]

## 2010-07-09 NOTE — Cardiovascular Report (Signed)
. [facility]  Patient:    Faith [name], Faith [name]                     MRN: [id number] Proc. Date: 09/01/00 Adm. Date:  [id number] Attending:  Dayle Points                        Cardiac Catheterization  PROCEDURE DONE BY:  Ricki Rodriguez, M.D.  PROCEDURES:  Left heart catheterization, selective coronary angiography, left ventricular function study, left subclavian artery injection for left internal mammary artery.  INDICATIONS:  This 55 year old black female with a known history of coronary artery disease had a non-Q-wave myocardial infarction and congestive heart failure.  The patient additionally has end-stage renal disease.  COMPLICATIONS:  None.  HEMODYNAMIC DATA:  Left ventricular pressure was 162/21.  Aortic pressure was 162/72 with a mean pressure of 115 mmHg.  ANGIOGRAPHIC DATA:  Left ventriculogram:  The left ventriculogram showed mild inferior and anterior wall hypokinesia with an ejection fraction of 45%.  Left subclavian artery injection:  The left subclavian artery injection showed patent left internal mammary artery.  There was rapid return and venous system showing innominate vein.  Coronary anatomy: 1. The left main coronary artery was unremarkable. 2. Left anterior descending artery:  The left anterior descending coronary    artery had a proximal 20-30% stenosis followed by a 99% moderately long    lesion.  The distal left anterior descending coronary artery wraps    around the apex of the heart.  The diagonal #1 vessel had mild proximal    disease.  The diagonal #2 vessel was a larger vessel with an ostial 80%    stenosis.  Diagonal #3 and #4 were very small vessels. 3. Left circumflex coronary artery:  The left circumflex coronary artery    showed a very small obtuse marginal branch #2 and a relatively small    ramus branch.  The obtuse marginal branch #1 had mild disease, and obtuse    marginal branch #3 had ostial  80-90% stenosis.  It somehow appeared to be    codominant with the right coronary artery.  The left circumflex artery    itself had a gradual mid vessel narrowing followed by an eccentric 70-80%    lesion. 4. Right coronary artery:  The right coronary artery had a proximal long 50%    long in-stent restenosis followed by a 99% stenosis in the mid vessel, and    the distal vessel had mild luminal irregularities.  The PDA and    posterolateral branches were smaller in size.  IMPRESSION: 1. Multi-vessel, native vessel, coronary artery disease. 2. Mild left ventricular systolic dysfunction. 3. Patent left internal mammary artery.  RECOMMENDATIONS:  This patient will be evaluated by cardiovascular thoracic surgeon for possible coronary artery bypass graft surgery. DD:  09/01/00 TD:  09/01/00 Job: [id number]

## 2010-07-09 NOTE — Discharge Summary (Signed)
Faith [name], Faith NO.:  [id number]   MEDICAL RECORD NO.:  [id number]          PATIENT TYPE:  INP   LOCATION:  4709                         FACILITY:  [facility]   PHYSICIAN:  Faith [name], M.D.  DATE OF BIRTH:  [birth year]   DATE OF ADMISSION:  11/09/2004  DATE OF DISCHARGE:  11/11/2004                                 DISCHARGE SUMMARY   PRINCIPAL DIAGNOSES:  1.  Restrictive lung disease.  2.  End-stage renal disease.  3.  Hemodialysis dialysis status.  4.  Aortic coronary bypass.  5.  Diabetes with neurologic manifestations.  6.  Coronary atherosclerosis.  7.  Obesity.  8.  Diabetic retinopathy.   DISCHARGE MEDICATIONS:  1.  Sensipar 60 mg one daily.  2.  Fleets enema as needed.  3.  Renagel 800 mg two tablets with each meal.  4.  Protonix 40 mg daily.  5.  Reglan 5 mg before meals and at bedtime.  6.  Renovate one daily.  7.  Glipizide XL 10 mg twice daily.  8.  Coumadin 5 mg and 7.5 mg alternating daily.  9.  Nitroglycerin 0.4 mg one sublingual every 5 minutes x 3 as needed for      chest pain.  10. Aspirin 81 mg one daily.  11. Plavix 75 mg. one daily.  12. Zetia 10 mg daily.   FOLLOW UP:  By [facility] is arranged, and by Dr. Orpah Cobb in 2  weeks.   DISCHARGE DIET:  Low-fat, low-salt, potassium restricted diet.   ACTIVITY:  As tolerated.   HISTORY:  This 55 year old black female presented with exertional and non-  exertional dyspnea, along with orthopnea of two-weeks duration.  The patient  denied any chest pain, but had some nausea.  Had a descending angioplasty  done at [facility] where she claims four stents were cleaned out.  She was off  Coumadin for four days prior to angioplasty.  She is now back on Coumadin  and has been on Plavix also.   PAST MEDICAL HISTORY:  Positive for diabetes for 30 years, hypertension for  20 years.  The patient quit smoking 10 to 11 years ago and had positive  history of elevated cholesterol level,  obesity and three-vessel coronary  artery disease in family, and a history of stroke with left-sided weakness.   PHYSICAL EXAMINATION:  VITAL SIGNS:  Pulse 72, respirations 13, blood  pressure 140/70, height 5 feet 4 inch, weight 184 pounds.  HEENT:  The patient is normocephalic, atraumatic with brown/black hair,  brown eyes.  Pupils equal, round and reactive to light.  Extraocular  movements are intact.  No teeth.  The patient is moon-faced.  NECK:  No JVD.  No carotid bruit.  Has full range-of-motion.  LUNGS:  Clear to auscultation bilaterally.  HEART:  Normal S1, S2 with grade 2/6 systolic murmur.  ABDOMEN:  Soft, non-tender, but distended.  EXTREMITIES:  No cyanosis, clubbing, or edema, but significant weakness on  the left side.  Calf muscles non-tender.  Left thumb has arteriovenous  fistula.  NEUROLOGICALLY:  Cranial nerves grossly intact, unequal grips  with the right  more stronger than the left.   LABORATORY DATA:  Normal hemoglobin, hematocrit, WBC count, platelet count.  Normal electrolytes, elevated BUN.  Creatinine 9.1, sugar 101. CK-MB,  troponin and INR normal x3.  Cholesterol 171, triglyceride 231, HDL  cholesterol 32.   CT of the chest did not reveal any evidence of pulmonary embolism, but had  cardiomegaly, and atelectasis was starting in the left lower lobe and  lingula, atrophic kidneys consistent with end-stage renal disease and small  hiatal hernia.  Pulmonary function showed minimal obstructive disease and moderate  restrictive disease of the lung.  On Chest x-ray, there were no acute cardiopulmonary disease.   HOSPITAL COURSE:  The patient was admitted to the telemetry unit, myocardial  infarction was ruled out.  She underwent a pulmonary function test that  showed moderate restrictive disease.  CT of the chest was negative for  pulmonary embolism.  Pulmonary consult by Dr. Delton Coombes was obtained, who will  see the patient on an outpatient basis.  [facility] physician was  consulted for her dialysis need.  Overall, patient's condition was stable  and she was discharged home in satisfactory condition with follow-up by her  pulmonary physician and Renal physician was arranged.      Faith [name], M.D.  Electronically Signed     ASK/MEDQ  D:  01/20/2005  T:  01/20/2005  Job:  [id number]

## 2010-07-09 NOTE — Op Note (Signed)
[place]  Patient:    Faith [name], Faith [name]                     MRN: [id number] Proc. Date: 05/10/00 Adm. Date:  [id number] Disc. Date: [id number] Attending:  Alyson Locket                           Operative Report  PREOPERATIVE DIAGNOSIS:  End-stage renal disease with occluded left forearm loop arteriovenous Gore-Tex graft.  POSTOPERATIVE DIAGNOSIS:  End-stage renal disease with occluded left forearm loop arteriovenous Gore-Tex graft.  OPERATION PERFORMED:  Thrombectomy, interposition of jump graft, revision to higher brachial vein of left forearm with arteriovenous Gore-Tex graft.  SURGEON:  Larina Earthly, M.D.  ASSISTANT:  Areta Haber, P.A.  ANESTHESIA:  MAC.  COMPLICATIONS:  None.  DISPOSITION:  To recovery room stable.  DESCRIPTION OF PROCEDURE:  The patient was taken to the operating room and placed in supine position where the area of the left arm was prepped and draped in sterile fashion.  Using local anesthesia, an incision was made over the prior venous anastomosis and carried down to isolate the graft.  The graft was opened transversely near the venous anastomosis.  Graft subsequently thrombectomized.  Arterial lines plug was removed and excellent inflow was obtained.  This was flushed with heparinized saline and reoccluded.  Next, the venous anastomosis was thrombectomized.  There was a tight stenosis at the venous anastomosis.  The vein was exposed further proximally.  The old graft was ligated at the old venous anastomosis.  The vein was ligated above the old venous anastomosis and was opened and extended longitudinally onto brachial vein above this.  The vein was of good caliber at this level.  The new interposition graft 6 mm Gore-Tex was brought onto the field, was sewn into the brachial vein with running 6-0 Prolene suture and then end-to-end to the old graft with running 6-0 Prolene suture.  Clamps were removed  and good thrill was noted.  The wound was irrigated with saline.  Hemostasis electrocautery.  Wound closed 3-0 Vicryl subcutaneous subcuticular tissue. Benzoin and Steri-Strips applied. DD:  05/10/00 TD:  05/11/00 Job: [id number]

## 2010-07-09 NOTE — Op Note (Signed)
[place]. [facility]  Patient:    Faith [name], Faith [name]                     MRN: [id number] Proc. Date: 09/05/00 Adm. Date:  [id number] Attending:  Waldo Laine CC:         Duke Salvia. Eliott Nine, M.D.  Ricki Rodriguez, M.D.   Operative Report  PREOPERATIVE DIAGNOSIS:  Coronary occlusive disease with recent cardiopulmonary arrest.  POSTOPERATIVE DIAGNOSIS:  Coronary occlusive disease with recent cardiopulmonary arrest.  PROCEDURE:  Coronary artery bypass grafting x 3 with left internal mammary to the left anterior descending coronary artery, reversed saphenous vein graft to the distal right coronary artery, reversed saphenous vein graft to the obtuse marginal coronary artery.  SURGEON:  Gwenith Daily. Tyrone Sage, M.D.  ASSISTANTS:  Areta Haber, P.A., and Lissa Merlin, P.A.  BRIEF HISTORY:  The patient is a 55 year old female with known coronary occlusive disease, who had a stent in the right coronary artery placed in 1988.  Since that time she has presented approximately two weeks ago with disease of her gallbladder and underwent cholecystectomy.  On the way home from this, she suffered a mild right hemispheric stroke.  She stabilized at home but then returned several days prior to surgery in severe respiratory distress requiring intubation and code called in the emergency room.  She stabilized medically and was seen by Dr. Algie Coffer in consultation and underwent cardiac catheterization.  This revealed high-grade stenosis of the LAD, greater than 90% high-grade right stenosis involving the stent, and a severe stenosis just distal to the stent.  Circumflex also had an 80-90% stenosis but was a small distal vessel.  Overall ventricular function was in the 40-50% range.  The patient has a history of long-standing diabetes and dialysis for approximately one year.  She agreed to the recommended coronary artery bypass grafting.  She was aware of the increased  risk because of her numerous medical problems and recent stroke.  Carotid duplex scans were done and echo done preoperatively without obvious source of embolic phenomenon.  DESCRIPTION OF PROCEDURE:  With Swan-Ganz and arterial line monitors in place, the patient underwent general endotracheal anesthesia without incident.  Skin of the chest and legs was prepped with Betadine and draped in the usual sterile manner.  Initially attempt to harvest vein from the right thigh using endoscopic vein harvest system was unsuccessful because of the difficulty locating vein at the knee, and the vein that was located was of small size. Vein was harvested from the right thigh and from the groin to just below the knee, and of this vein, two segments were usable.  Median sternotomy was performed.  The left internal mammary artery was dissected down as a pedicle graft.  The distal artery was divided, had good, free flow.  Pericardium was opened.  The patient had a moderate-sized pericardial effusion and had evidence of biventricular enlargement.  Patient was systemically heparinized. The ascending aorta and the right atrium were cannulated, and the aortic root vent cardioplegia was introduced into the ascending aorta.  The patient was placed on cardiopulmonary bypass 2.4 L/min per sq m.  Sites of anastomosis were selected and dissected out of the epicardial space.  Body temperature was cooled to 30 degrees, aortic crossclamp was applied, and 500 cc of cold blood potassium cardioplegia was administered with rapid diastolic arrest of the heart.  Myocardial septal temperature was monitored throughout the crossclamp period.  Attention was turned first to  the distal right coronary artery, which was opened and had some disease in the wall of the vessel but admitted a 1.5 mm probe distally.  Using a running 7-0 Prolene, distal anastomosis was performed.  Attention was then turned to the distal circumflex vessel,  which was a small vessel and admitted a 1 mm probe distally.  Using a running 8-0 Prolene, the second reversed saphenous vein graft was anastomosed to the circumflex coronary artery.  Attention was then turned to the left anterior descending coronary artery, which was opened and was a 2 mm vessel and using running 8-0 Prolene, the left internal mammary artery was anastomosed to the left anterior descending coronary.  With release of the Edwards bulldog on the mammary artery, there was appropriate rise in myocardial septal temperature. Aortic crossclamp was removed.  Total crossclamp time 48 minutes.  The patient spontaneously converted to a sinus rhythm.  Partial occlusion clamp was placed on the ascending aorta.  Two punch aortotomies were performed and each of two vein grafts anastomosed to the ascending aorta.  Air was evacuated from the grafts and the partial occluding clamp was removed.  Sites of anastomosis were inspected for any bleeding.  The patient was then ventilated and weaned from cardiopulmonary bypass without difficulty and remained hemodynamically stable. He was decannulated in the usual fashion.  Protamine sulfate was administered with operative fields hemostatic.  Two atrial and two ventricular pacing wires were applied, graft markers applied.  Left pleural tube, two mediastinal tubes left in place.  The sternum was closed with #6 stainless steel wire, fascia closed with interrupted 3-0 Vicryl, running 3-0 Vicryl subcutaneous tissue, 4-0 subcuticular stitch in the skin edges.  Dry dressings were applied. Sponge and needle count was reported as correct at the completion of the procedure.  The patient tolerated the procedure without obvious complication and was transferred to the surgical intensive care unit for further postoperative care.  Total pump time was 103 minutes.  She did require one unit of bank blood because of low hematocrit while on bypass. DD:  09/05/00 TD:   09/06/00 Job: [id number]

## 2010-07-09 NOTE — H&P (Signed)
[place]. [facility]  Patient:    Faith [name], Faith [name]                     MRN: [id number] Adm. Date:  [id number] Attending:  Dayle Points CC:         D. Karle Plumber, M.D.  James L. Deterding, M.D.   History and Physical  PREOPERATIVE DIAGNOSIS:  Thrombosed left arm arteriovenous Gore-Tex graft.  POSTOPERATIVE DIAGNOSIS:  Thrombosed left arm arteriovenous Gore-Tex graft.  OPERATION PERFORMED:  Thrombectomy and revision with extension into the brachial vein.  SURGEON:  D. Karle Plumber, M.D.  FIRST ASSISTANT:  Pilar Grammes  ANESTHESIA:  DESCRIPTION OF PROCEDURE:  After prepping and draping the left arm, the area was infiltrated with 1% Xylocaine at antecubital fossa and the previous incision was opened and the arterial and the venous end were dissected out. The venous end went into the basilic vein and this was opened longitudinally and the vein had a marked amount of intimal hyperplasia.  It was decided to pass the Fogarty up the basilic vein and it only went up approximately 2 cm where we had complete obstruction, so it was decided to convert this into another vein.  An area was infiltrated with 1% Xylocaine on the medial area and dissection was carried down dissecting out the brachial vein which was opened which was looped proximally and distally and opened longitudinally. Taking a 6 mm Gore-Tex graft, this was cut tangentially and an end-to-side anastomosis was performed with 6-0 Prolene in a running continuous fashion. The graft was then tunneled back to the antecubital fossa and the vein was ligated proximally and distally and the anastomosis was removed and then the graft cut tangentially.  The patient was given 4000 units of heparin and a proximal thrombectomy carried out with a 4 Fogarty catheter.  However, would not remove clot from the arterial end, so a transverse incision had to be made in the arterial end and the clot was removed  from the transverse incision. Then the graft was clamped and oversewn.  The transverse incision was closed with 6-0 Prolene.  Then an end-to-end anastomosis performed between the old graft and new graft with 6-0 Prolene in running continuous fashion.  All clamps were removed and there was good flow through the graft and the suture was tied down.  The wound was then closed with 3-0 Vicryl and Ethicon skin clips.  The patient was taken to the recovery room in stable condition. DD:  01/21/00 TD:  01/21/00 Job: [id number]

## 2010-07-09 NOTE — Discharge Summary (Signed)
NAMESOKHA, CRAKER NO.:  [id number]   MEDICAL RECORD NO.:  [id number]          PATIENT TYPE:  INP   LOCATION:  5509                         FACILITY:  [facility]   PHYSICIAN:  Wilber Bihari. Caryn Section, M.D.   DATE OF BIRTH:  05-07-1955   DATE OF ADMISSION:  09/27/2005  DATE OF DISCHARGE:  10/01/2005                                 DISCHARGE SUMMARY   ADMITTING DIAGNOSES:  1. Nausea, vomiting, fever.  Rule out sepsis.  2. New bilateral upper extremity intention tremors with history of      cerebrovascular accident.  3. End-stage renal disease on chronic hemodialysis.  4. Anemia of chronic disease.  5. Secondary hyperparathyroidism.  6. Non-insulin-dependent diabetes mellitus.  7. Peripheral vascular disease.  8. Coronary artery disease status post coronary artery bypass grafting.   DISCHARGE DIAGNOSES:  1. Methicillin-resistant staph aureus bacteremia with negative workup,      improved on vancomycin.  2. Upper extremity intention tremors, resolved, felt probably secondary to      toxic metabolic effect of sepsis.  3. End-stage renal disease on chronic hemodialysis.  4. Anemia of chronic disease.  5. Secondary hyperparathyroidism.  6. Non-insulin-dependent diabetes mellitus.  7. Coronary artery disease status post coronary artery bypass grafting.  8. Peripheral vascular disease.  9. Paroxysmal atrial fibrillation during transesophageal echocardiogram,      converted with medications.   BRIEF HISTORY:  This 55 year old black female with end-stage renal disease  on chronic dialysis Monday, Wednesday, Friday at the [facility] presents to dialysis with 48 hours of nausea, vomiting, fever and  chills.  She was brought to the emergency room over the weekend a couple  days ago for similar symptoms and was told she had a virus.  On the day of  admission, her symptoms have worsened. She is complaining of back and left  shoulder pain.  She has a left upper arm  graft and a right upper arm  fistula.  Both are unremarkable. She is being admitted to rule out sepsis.   LABORATORIES ON ADMISSION:  White count 10,100, hemoglobin 13.6, hematocrit  41.1, platelets 268,000, 92 segs, 4 lymphocytes, 4 monocytes.  Sodium 131,  potassium 5.1, chloride 99, glucose 146, BUN 26, creatinine 7.3, calcium  10.2, albumin 3.5, phosphorus 3.9.  Chest x-ray shows a stable postoperative  chest with mild vascular congestion and no acute findings.  CT of the head  shows atrophy and small-vessel ischemic changes.  No acute or focal  abnormality.   HOSPITAL COURSE:  1. Methicillin-resistant staph aureus bacteremia.  The patient was      admitted, placed on her usual medications and pan cultured, after which      she was started on vancomycin and Zosyn.  Within 48 hours of      hospitalization, blood cultures returned growing methicillin-resistant      staph aureus in all 4 bottles.  At this point, Zosyn was stopped.      Vancomycin was continued.  A transthoracic echocardiogram was done      which showed overall left ventricular systolic function to be normal  with an injection fraction of 60%.  However, a bright echo was noted on      the tricuspid aortic valve and a non-mobile echodense lesion was seen      on the posterior mitral valve leaflet.  This prompted a transesophageal      echocardiogram.  The transesophageal echocardiogram was performed      showing no clots or vegetations on any valves.  Dopplers showed mild      tricuspid regurgitation, minimal mitral regurgitation and no aortic      stenosis or insufficiency.  There was no right-to-left shunt seen on      study.  The left upper arm AV fistula, which is maturing, is benign on      exam.  No source was identified for her bacteremia.  Vancomycin will be      dosed for a total of 2 weeks and continued [facility] 750      mg IV each dialysis through August 24.  2. Paroxysmal atrial  fibrillation.  The patient experienced rapid atrial      fibrillation during her transesophageal echocardiogram.  She converted      to sinus rhythm with intravenous digoxin and Lopressor.  Her heart      rhythm remained sinus and rate controlled in the 70s.  At the time of      discharge, Dr. Algie Coffer advised to send her home only on digoxin at      0.125 mg orally after each dialysis treatment Monday, Wednesday,      Friday.  He will see her in the office in follow-up in approximately 2      weeks after discharge.   DISCHARGE MEDICATIONS:  1. Aspirin 81 mg daily.  2. Glipizide XL 10 mg b.i.d.  3. Reglan 5 mg before meals and at bedtime.  4. Renagel 800 mg, 4 with meals and 2 with snacks.  5. Sensipar 90 mg daily.  6. Gabapentin 300 mg nightly.  7. Zetia 10 mg nightly.  8. Plavix 75 mg daily.  9. Protonix 40 mg nightly.  10.Digoxin 0.125 mg, 1 after each dialysis Monday, Wednesday, Friday.  11.EPO 1500 units IV each dialysis.  12.Hectorol 5 mcg IV each dialysis.  13.Vancomycin 750 mg IV each dialysis through August 24th.  14.Dialysis Monday, Wednesday, Friday at the [facility].      Zenovia Jordan, P.A.    ______________________________  Wilber Bihari. Caryn Section, M.D.    RRK/MEDQ  D:  12/31/2005  T:  12/31/2005  Job:  [id number]   cc:   Ricki Rodriguez, M.D.

## 2010-07-09 NOTE — H&P (Signed)
NAMELORETHA, URE NO.:  [id number]   MEDICAL RECORD NO.:  [id number]          PATIENT TYPE:  INP   LOCATION:  4709                         FACILITY:  [facility]   PHYSICIAN:  Ricki Rodriguez, M.D.  DATE OF BIRTH:  1956/01/07   DATE OF ADMISSION:  11/09/2004  DATE OF DISCHARGE:                                HISTORY & PHYSICAL   CHIEF COMPLAINT:  Shortness of breath.   HISTORY OF PRESENT ILLNESS:  This 55 year old black female has exertional  and non-exertional dyspnea along with orthopnea for the last two weeks.  The  patient denies chest pain, but has nausea.  She had a recent angioplasty  done at [facility] where she claims four stents were cleaned out.  She was off  Coumadin for four days prior to angioplasty.  She is now back on Coumadin,  and has been on Plavix.   PAST MEDICAL HISTORY:  1.  Diabetes for 30 years.  2.  Hypertension for 20 years.  3.  She quit smoking 10 to 11 years ago.  4.  History of alcohol use.  No history of street drug use.  5.  Positive history of elevated cholesterol level.  No history of      myocardial infarction.  6.  Positive history of obesity.  No history of exercise.  7.  Positive history of premature coronary artery disease in the family.  8.  History of stroke with left-sided weakness.   PAST SURGICAL HISTORY:  1.  Gallbladder surgery in 2002.  2.  Bypass surgery in 2002.  3.  PTCA in 1998 and 2006.   CURRENT MEDICATIONS:  1.  Sensipar 60 mg one daily.  2.  Fleet's enema p.r.n. constipation.  3.  Renagel 800 mg three tablets with each meal.  4.  Protonix 40 mg one daily.  5.  Reglan 5 mg one before meals and at bedtime.  6.  Rena-Vite one tablet daily.  7.  Glipizide XL 10 mg one b.i.d.  8.  Coumadin 5 mg and 7.5 mg alternating daily.  9.  Nitro-tab 0.4 mg one sublingual every five minutes x3 p.r.n. chest pain.  10. Aspirin 81 mg one daily.  11. Plavix 75 mg one daily.  12. Zetia 10 mg one daily.   ALLERGIES:   LIPITOR and ZOCOR giving muscle pain and stiffness.   SOCIAL HISTORY:  The patient has been married for 30 years.  She has  disability for six years.  She worked as a Engineer, civil (consulting).  Has one son, 73  years old, and husband is 48 years old and healthy.   FAMILY HISTORY:  Mother died of liver disease.  Father died of coronary  artery disease at age 20.  The patient has two brothers with bypass graft  surgery, and three sisters, and one of the sisters had bypass graft surgery  done.   REVIEW OF SYSTEMS:  Positive weight gain.  No history of vision change,  cataract surgery, hearing loss, tinnitus, rhinorrhea, or dentures.  However,  the patient is edentulous.  Negative history of cough, hemoptysis, asthma,  or COPD.  Negative history of palpitations, dizziness, leg edema, or  claudication.  No history of GI bleed, hiatal hernia, hepatitis, or seizure  disorder, psychiatric admissions, incontinence.  Positive history of  dyspnea, chest pain, constipation, kidney stone, and joint pains.   IMMUNIZATIONS:  The patient had a flu shot 11 months ago, and had a  pneumonia shot in 2004.   PHYSICAL EXAMINATION:  VITAL SIGNS:  Pulse 72, respirations 13, blood  pressure 140/70, height 5 feet 4 inches, weight 184 pounds.  HEENT:  The patient is normocephalic, atraumatic with brown black hair,  brown eyes.  Pupils equal, round, reactive to light.  Extraocular movements  were intact.  No teeth.  The patient is moon-faced.  NECK:  No JVD, no carotid bruit.  She has full range of motion.  LUNGS:  Clear to auscultation bilaterally.  HEART:  Normal S1 and S2, with a grade 2/6 systolic murmur.  ABDOMEN:  Soft, nontender, but distended.  EXTREMITIES:  No cyanosis, clubbing, or edema.  Significant weakness on the  left side.  Calf muscles are nontender.  Left arm has AV fistula.  NEUROLOGIC:  Cranial nerves II-XII grossly intact, and unequal grips, the  right more than the left.   LABORATORY DATA:   Pending.   ASSESSMENT:  1.  Shortness of breath and weakness, rule out pulmonary embolism.  Rule out      anemia.  2.  Diabetes mellitus type 2.  3.  End-stage renal disease.  4.  Cerebrovascular accident with left-sided hemiplegia.  5.  Coronary artery disease.   PLAN:  1.  Admit the patient to telemetry bed.  2.  Check EKG, oxygen saturations, CT scan of the chest to rule out      pulmonary embolism.      Ricki Rodriguez, M.D.  Electronically Signed     ASK/MEDQ  D:  11/09/2004  T:  11/10/2004  Job:  [id number]

## 2010-07-09 NOTE — Consult Note (Signed)
[place]  Patient:    Faith [name], Faith [name]                     MRN: [id number] Proc. Date: 09/01/00 Adm. Date:  [id number] Attending:  Dayle Points CC:         Ricki Rodriguez, M.D.  Lilly Cove, M.D.  Duke Salvia Eliott Nine, M.D.  Marlan Palau, M.D.   Consultation Report  PHYSICIANS:  Referring physician: Ricki Rodriguez, M.D.              Followup cardiologist:  Ricki Rodriguez, M.D.              Primary Care: Lilly Cove, M.D.              Nephrologist: Duke Salvia. Eliott Nine, M.D.              Neurologist: Marlan Palau, M.D.  REASON FOR CONSULTATION: Coronary occlusive disease, recent cardiopulmonary arrest.  HISTORY OF PRESENT ILLNESS:  The patient is a 55 year old chronically ill female on dialysis who presented to the emergency room on August 28, 2000, in severe respiratory distress requiring a code being called and the patient being resuscitated.  She as noted to be pulseless.  CPR was started.  The patient was given epinephrine, calcium, Lopressor.  She was revived and stabilized.  Since this initial episode, the patient has improved, was weaned and extubated from the ventilator, and has undergone dialysis.  CK-MBs were obtained with CK of 432 and MB of 9.2 as a peak.  On questioning now, notes that with this episode, she had increasing shortness of breath prior to coming to the emergency room associated with diaphoresis and feeling hot and also a significant amount of coughing.  She also had upper chest discomfort which was nonradiating.  She has known coronary occlusive disease having had angioplasty done in 1998.  At that time, she had symptoms of angina.  She has had no previous cardiac surgery.  Cardiac risk factors include hypertension, hyperlipidemia, positive family history with a father who died of MI at age 20.  Her mother died at age 74 of a stroke.  She has one brother who had bypass surgery five years ago.   The patient has had a previous stroke approximately two weeks ago when, on the way home from having a cholecystectomy done, she noted numbness and tingling and weakness in her left side.  She saw Dr. Lesia Sago for this.  Discussed with Dr. Anne Hahn and have his office records from that evaluation.  It was felt that she had probable right brain subcortical stroke.  Her workup was in progress including a Doppler which was not yet obtained.  The patient has had at least 20 years of diabetes.  She does not smoke.  She denies claudication.  She is on chronic dialysis.  PAST SURGICAL HISTORY: 1. Includes cholecystectomy two weeks ago. 2. End-stage renal disease.  Dialysis begun November 2001. 3. The patient has severe diabetes, type 2, x 20 years with associated    peripheral neuropathy and retinopathy. 4. History of secondary hyperparathyroidism. 5. History of hypercholesterolemia. 6. History of extreme hypertension.  SOCIAL HISTORY:  The patient is married, unemployed, has one child of age 24.  MEDICATIONS ON ADMISSION: 1. Nephro-Vite. 2. Glyburide 25 p.o. b.i.d. 3. Avandia 4 mg b.i.d. 4. Aspirin 1 a day. 5. Toprol XL 50 mg a day. 6. Norvasc 10 mg a day. 7. Nitrostat p.r.n.  8. Sorbitol p.r.n.  DRUG ALLERGIES AND SENSITIVITIES:  None.  CARDIAC REVIEW OF SYSTEMS:  The patient complains, as noted above, of chest pain, resting shortness of breath, and exertional shortness of breath.  She denies lower extremity edema, palpitations, syncope, presyncope, or orthopnea.  GENERAL REVIEW OF SYSTEMS:  The patient notes general fatigue and malaise. Respiratory: As noted above, severe respiratory distress requiring intubation. The patient denies previous hemoptysis.  Gastrointestinal: The patient denies any recent GI bleeding, is recently recovering from cholecystectomy. Neurologic: Review of Systems is documented by Dr. Anne Hahn note.  The patient notes some residual left-sided weakness.  She  notes bilateral knee pain.  GU: The patient does urinate on a daily basis, notes no blood in it.  She has had no recent infections.  She has had no recent hematologic disorder that the patient is aware of such as easy bruisability; however, she has been noted since hospitalization to have decreased platelet count to 90,000.  Endocrine: The patient denies any hypothyroid symptoms.  She notes that she has had thyroid tests done in the past.  She denies any psychiatric history, denies any amaurosis or claudication.  PHYSICAL EXAMINATION:  GENERAL:  The patient is currently on hemodialysis.  She is neurologically intact and able to relate her history.  VITAL SIGNS:  Blood pressure 150/97, pulse 84 and sinus, respiratory rate 16. She is 5 feet 4-1/2 inches tall, weighs 83.9 kg.  GENERAL:  She is chronically ill appearing, especially for her age of 55.  HEENT:  Pupils are equal, round and reactive to light.  NECK:  Supple without adenopathy.  She does not have carotid bruits.  She has no jugular venous distension.  CHEST:  Clear to auscultation and percussion.  CARDIAC:  Exam reveals a regular rate and rhythm.  ABDOMEN:  Moderately obese with well-healed incisions from previous laparoscopic surgery.  She has no palpable masses.  EXTREMITIES:  The patient appears to have small but usable vein in the lower extremities.  Grossly, she has intact grip in the left hand.  She has slightly increased hyperreflexia in the left lower extremity with a left Babinski.  NODES:  She has no palpable lymph nodes.  VASCULAR:  She has 2+ DP and PT pulses bilaterally.  LABORATORY FINDINGS:  Sodium 136, potassium 4.1, glucose 234, creatinine 5.3, BUN 24.  INR 1.3.  White count 8.9, hematocrit 32.5 on August 31, 2000.  Platelet count on July 8 was reported as 348,000.  Other platelet counts were noted to be clumped.  The cardiac catheterization was performed and reviewed. It demonstrated significant  three-vessel disease with ejection fraction approximately 40%, patent left internal mammary.  The LAD has a 99% lesion.  Right has a long 50% lesion with in-stent stenosis of 95%.  Distal vessel looks reasonable for bypass though is small.  IMPRESSION:  Overall, with patients critical anatomy, coronary artery bypass grafting is recommended.  It is of concern that she has had a recent stroke within the last two weeks and would dramatically increase her risk of stroke with bypass surgery.  We will obtain carotid Doppler studies preoperatively and will need to arrange for her dialysis to be optimized prior to surgery. With the combination of bleeding diaphysis common with renal failure patients, will avoid use of Plavix in addition to her aspirin.  I discussed with the patient the risks and options involved including increased risk of bypass surgery, especially the increased risk of stroke because of her recent stroke.  She is  aware of increased risk of surgery with her chronic medical problems including renal failure and dialysis.  The risk of death, infection, stroke, myocardial infarction, bleeding, blood transfusions have all been discussed with the patient in detail.  The patient is agreeable with proceeding.  We tentatively plan for surgery on July 16. DD:  09/01/00 TD:  09/01/00 Job: [id number]

## 2010-07-09 NOTE — H&P (Signed)
[place]  Patient:    Faith [name], Faith [name]                     MRN: [id number] Adm. Date:  [id number] Disc. Date: [id number] Attending:  Melvenia Needles                         History and Physical  HISTORY OF PRESENT ILLNESS:  A 55 year old African-American female presented to the emergency room with nausea, vomiting, retrosternal chest pain radiating through to the back for the past two weeks, which is intermittent.  She has some funny food eversion.  She states that she has end-stage renal disease, on hemodialysis, and developed a clot in the right forearm, with recent creation of a new right upper extremity A-V graft.  She denies any dyspnea, diaphoresis or syncopal spells.  She states the pain is usually relieved with Aleve or Bayer aspirin but she states now is poorly controlled.  She presented to the emergency room for relief of pain.  PAST MEDICAL HISTORY:  1. End-stage renal disease since November of 2001.  2. Diabetes mellitus x 10 to 20 years with a history of retinopathy.  3. Hypertension.  4. History of coronary stent in 1998 for unstable angina.  MEDICATIONS:  1. Niaspan 1500 mg q.h.s.  2. Nexium 40 mg q.d.  3. Nephro-Vite one tablet q.d.Marland Kitchen  4. Nitrostat 0.4 mg sublingual p.r.n.  5. Norvasc 10 mg q.d.  6. Darvocet-N one to two tablets q.4-6h. p.r.n.  7. Toprol-XL 50 mg q.d.  8. Avandia 4 mg b.i.d.  9. Hyzaar 50/12.5 mg b.i.d. 10. Glyburide 2.5 mg b.i.d.  SOCIAL HISTORY:  She is married, for 26 years.  She has one child.  She is a Designer, jewellery.  She denies tobacco, denies alcohol.  ALLERGIES:  She has no known drug allergies.  FAMILY HISTORY:  Mother died in her 8s of heart disease.  Father died in his 89s of heart disease.  She has three sisters; one sister had diabetes.  One brother had a CVA.  REVIEW OF SYSTEMS:  HEAD AND EYES:  No visual changes.  No blurry vision or double vision.  EARS, NOSE, MOUTH AND THROAT:  No  difficulty with speech or swallowing.  CARDIOVASCULAR:  States has good exertion.  No exacerbation of pain on exercise.  She states she has no orthopnea.  She is able to lie flat without shortness of breath.  She has no paroxysmal nocturnal dyspnea.  She denies palpitations, syncopal spells or intermittent claudication symptoms. RESPIRATORY:  She is a nonsmoker.  Denies cough, wheeze, hemoptysis or shortness of breath.  ABDOMEN:  No change in bowel habit.  No blood in stools. No coffee-grounds emesis.  NEUROLOGIC:  No history of headache, double vision, blurry vision, difficulty speaking, swelling or numbness, pins and needles or weakness in her upper and lower extremities.  PHYSICAL EXAMINATION:  VITAL SIGNS:  Blood pressure 120/80.  Pulse of 80.  Respiratory rate of 12. Temperature:  Afebrile.  HEENT:  Head and eyes:  Pallor with pupils equal and reactive to light with no asymmetry.  Ears, nose, mouth and throat were clear.  CARDIOVASCULAR:  S1 and S2 with left ventricular heave.  No murmur, rub or gallop.  RESPIRATORY:  ______ rales with percussion note which was resonant.  ABDOMEN:  Mild tenderness, right upper quadrant with some tenderness in the epigastrium.  NEUROLOGIC:  Alert and oriented x 3.  Cranial nerves II-XII intact.  Motor normal at 3/5 bilaterally, upper and lower.  Reflexes:  Downward-going Babinskis.  LABORATORY WORK:  WBC of 10.6, hemoglobin 8.9, hematocrit 26, platelet count of 508,000.  Sodium 138, potassium 3.5, chloride 97, CO2 31, BUN 27, creatinine 7.7, glucose of 270, calcium of 9.4.  Mildly increased AST.  CK 121, MB of 0.05, troponin 0.2.  EKG:  Normal sinus rhythm ______ .  Chest x-ray:  Mild pulmonary edema.  ASSESSMENT AND PLAN:  1. Chest pain, not acute, atypical pain.  Check right upper quadrant     ultrasound to rule out gallstones, serial troponins and serial     electrocardiograms.  2. Mild pulmonary edema.  Limit fluid intake to 1200  cc over 24 hours and     4 g low-salt salt.  3. End-stage renal disease -- hemodialysis.  Will hold on hemodialysis until     a.m.  Will dialyze patient through right internal jugular. DD:  08/06/00 TD:  08/06/00 Job: [id number]

## 2010-07-09 NOTE — Op Note (Signed)
[place]. [facility]  Patient:    Faith [name], Faith [name]                     MRN: [id number] Proc. Date: 07/18/00 Adm. Date:  [id number] Attending:  Melvenia Needles                           Operative Report  PREOPERATIVE DIAGNOSIS:  Chronic renal failure, clotted left forearm AV graft.  POSTOPERATIVE DIAGNOSIS:  Chronic renal failure, clotted left forearm AV graft.  PROCEDURE:  Insertion of new left upper arm AV graft.  Placement of right IJ Ash catheter.  SURGEON:  Di Kindle. Edilia Bo, M.D.  ASSISTANT:  Lissa Hoard, P.A.  ANESTHESIA:  Local.  DESCRIPTION OF PROCEDURE:  The patient was taken to the operating room and sedated by anesthesia.  The entire left upper extremity was prepped and draped in the usual sterile fashion.  After the skin was infiltrated with 1% lidocaine, an oblique incision was made just above the antecubital level where the brachial artery was dissected free beneath the fascia and controlled with a blue vessel loop.  Separate longitudinal incision was made beneath the axilla after the skin was anesthetized.  The high brachial vein here was dissected free.  A 4 to 7 cm tapered graft was then tunneled between the two incisions and the patient was heparinized.  The brachial artery was clamped proximally and distally and a longitudinal arteriotomy was made.  A segment of the 4 mm end of the graft was excised, the graft slightly spatulated and sewn end-to-side to the brachial artery using continuous 6-0 Prolene suture.  The graft was then pulled to the appropriate length for anastomosis to the high brachial vein.  This was done end-to-end with continuous 6-0 Prolene suture. At completion, there was an excellent thrill in the graft.  There was a palpable radial pulse.  Hemostasis was obtained in the wounds.  The wounds was closed with a deep layer of 3-0 Vicryl and the skin closed with 4-0 Vicryl.  A sterile dressing was applied.  The  patient tolerated the procedure well and was transferred to the recovery room in satisfactory condition.  Sponge, needle, and instrument counts were correct.  Next, attention was turned to placement of a right IJ Ash catheter.  The patient was reprepped and draped. The right IJ was cannulated after the skin was anesthetized and a guide wire introduced into this inferior vena cava.  The exit site for the catheter was chosen and the skin anesthetized between the two areas.  A 24 cm catheter was then passed between the two incisions with the cuff positioned in the midportion of the tunnel.  The tract over the wire was then dilated and then a dilator and peelaway sheath were passed over the wire and the wire and dilator removed.  The catheter was passed through the peelaway sheath and positioned in the right atrium. Both ports withdrew easily.  We then flushed with heparinized saline and filled with concentrated heparin.  The catheter was secured to its exit site with a 3-0 nylon suture.  The IJ cannulation site was closed with a 4-0 subcuticular stitch.  A sterile dressing was applied.  The patient tolerated the procedure well and was transferred to the recovery room in satisfactory condition.  Sponge, needle, and instrument counts were correct. DD:  07/18/00 TD:  07/18/00 Job: [id number]

## 2010-07-09 NOTE — Discharge Summary (Signed)
[place]. [facility]  Patient:    Faith [name], Faith [name] Visit Number: [id number] MRN: [id number]          Service Type: [facility] Location: [id number] Attending Physician:  Faith Rogue T Dictated by:   Lissa Merlin, P.A. Adm. Date:  09/19/2000 Disc. Date: 10/03/2000                             Discharge Summary  DATE OF BIRTH:  [birth year]  SURGEON:  Dr. Sheliah Plane.  CARDIOLOGIST:  Dr. Orpah Cobb.  PRIMARY CARE:  Dr. Lilly Cove.  NEPHROLOGY:  Dr. Camille Bal.  NEUROLOGY:  Dr. Lesia Sago.  ADMISSION DIAGNOSES: 1. Chest pain. 2. Dyspnea.  DISCHARGE DIAGNOSES: 1. Status post cardiopulmonary arrest. 2. Non-Q-wave myocardial infarction. 3. Three-vessel coronary artery disease with ejection fraction of 45% and    abnormal left ventricular function. 4. Respiratory arrest requiring reintubation postoperatively. 5. Bronchitis, on Tequin. 6. Severe deconditioning, resulting in the bulk of her hospital stay. 7. Clotted arteriovenous Gore-Tex graft of left upper extremity.  PREEXISTING MEDICAL CONDITIONS: 1. Known coronary artery disease with previous PTCA. 2. Hypertension. 3. Non-insulin-dependent diabetes mellitus. 4. End-stage renal disease, on hemodialysis three times a week. 5. Anemia. 6. History of GI bleed.  PROCEDURES: 1. Intubation secondary to cardiopulmonary arrest on admission to ER on August 28, 2000. 2. Thrombectomy of clotted AV Gore-Tex graft of left upper extremity on August 31, 2000. 3. Cardiac catheterization on September 01, 2000. 4. Pre-CABG Dopplers on September 01, 2000, showing no ICA stenosis bilaterally    and ABIs greater than 1.0 in the lower extremities. 5. CABG x 3 on September 05, 2000, with the following grafts:  LIMA to LAD,    saphenous vein graft to distal circumflex, saphenous vein graft to RCA.  HISTORY OF PRESENT ILLNESS AND HOSPITAL COURSE:  The patient is a 55 year old chronically ill black female on  dialysis who presented to the emergency room on August 28, 2000, in severe respiratory distress, requiring a code being called and patient being resuscitated.  She was pulseless.  CPR was started.  She was revived and stabilized.  She was subsequently ruled in for non-Q-wave MI. Cardiology was consulted.  Cardiac catheterization was done, which showed three-vessel coronary disease.  Dr. Tyrone Sage was later consulted after she was stabilized.  After reviewing the data, he recommended CABG as best treatment. Pre-CABG studies were obtained, including Doppler studies.  The risks, benefits, details, and alternatives to surgery were discussed, and it was agreed to proceed.  She underwent CABG on September 05, 2000.  There were no complications.  She was taken to SICU in stable condition.  Postoperatively initially she was doing well.  However, later that afternoon after she was weaned from the ventilator she developed a sudden drop in blood pressure and pulse and was apneic.  She was bagged and reintubated.  She was given epinephrine with return of blood pressure.  She was subsequently stable.  She was later weaned off the ventilator and began making progress.  Also, prior to surgery she had a clotted left Gore-Tex graft which was thrombectomized in the OR.  Postoperatively she began a slow course secondary to severe deconditioning.  She was put on Tequin for bronchitis.  At this point the bulk of her hospital stay was secondary to severe deconditioning, initially not able to stand or walk.  After working with physical  therapy for several days, she began to regain her strength.  Her appetite was also poor, which took several days to correct.  She continued going to dialysis three times a week. By September 13, 2000, postoperative day #12, she was noted to be making an improvement.  She steadily regained her strength working with therapy. Clinically, she was better.  A rehabilitation bed was requested.   By postoperative day #15, September 19, 2000, she was walking with cardiac rehabilitation with assistance x 2, stable, and steady.  She was afebrile, vital signs were stable.  She was on room air.  Laboratory work was satisfactory.  Physical exam was satisfactory.  Wounds were healing well.  She was deemed suitable for transfer to rehabilitation and was subsequently transferred to rehabilitation.  MEDICATIONS: 1. Enteric-coated aspirin 325 mg 1 p.o. q.d. 2. Colace 200 mg daily.  3. Lopressor 12.5 mg b.i.d.  4. Nystatin powder b.i.d.  5. Nephro-Vite 1 p.o. q.d.  6. Zoloft 50 mg p.o. q.h.s.  7. Epogen 14,000 units Monday, Wednesday, Friday at dialysis.  8. Avandia 4 mg b.i.d. a.c.  9. Glucotrol 2.5 mg b.i.d. a.c. 10. Protonix 40 mg q.d. a.c. 11. InFeD 100 mg every Monday. 12. Nepro t.i.d. 13. Sliding scale insulin.  ALLERGIES:  CODEINE causes NAUSEA.  CONDITION ON DISCHARGE:  Stable and improved.  DISPOSITION:  Transferred to rehabilitation unit.  FOLLOW-UP:  CVTS will continue to follow on rehabilitation periodically.Dictated by:   Lissa Merlin, P.A. Attending Physician:  Faith Rogue T DD:  10/10/00 TD:  10/11/00 Job: [id number]

## 2010-07-12 LAB — POCT I-STAT, CHEM 8
Creatinine, Ser: 8.2 mg/dL — ABNORMAL HIGH (ref 0.4–1.2)
Hemoglobin: 13.9 g/dL (ref 12.0–15.0)
Sodium: 132 mEq/L — ABNORMAL LOW (ref 135–145)
TCO2: 29 mmol/L (ref 0–100)

## 2010-07-13 ENCOUNTER — Ambulatory Visit (HOSPITAL_COMMUNITY): Payer: Medicare (Managed Care) | Attending: Nephrology

## 2010-07-13 NOTE — Op Note (Signed)
NAME:  Faith [name], Faith [name] NO.:  [id number]  MEDICAL RECORD NO.:  [id number]           PATIENT TYPE:  O  LOCATION:  SDSC                         FACILITY:  [facility]  PHYSICIAN:  Janetta Hora. Fields, MD  DATE OF BIRTH:  [birth year]  DATE OF PROCEDURE:  07/09/2010 DATE OF DISCHARGE:  07/09/2010                              OPERATIVE REPORT   PROCEDURE:  Aortogram with bilateral lower extremity runoff.  PREOPERATIVE DIAGNOSIS:  Nonhealing wound, left foot.  POSTOPERATIVE DIAGNOSIS:  Nonhealing wound, left foot.  ANESTHESIA:  Local.  OPERATIVE DETAIL:  After obtaining informed consent, the patient was taken to the PV lab.  The patient was placed in supine position on the angio table.  Both groins were prepped and draped in usual sterile fashion.  Local anesthesia was infiltrated over the right common femoral artery.  An introducer needle was used to cannulate the right common femoral artery and a 0.03 Versacore wire threaded in the abdominal aorta under fluoroscopic guidance.  A 5-French sheath was then placed over the guidewire into the right common femoral artery and this was thoroughly flushed with heparinized saline.  A 5-French pigtail catheter was placed over the guidewire and an abdominal aortogram was obtained.  This shows a 50% stenosis of the origin of the right renal artery.  The left renal artery was patent.  The arterial arcade in the distal kidneys were atrophied bilaterally.  The infrarenal abdominal aorta was patent.  The left and right common internal and external iliac arteries were patent. Next the pigtail catheter was pulled down just above the aortic bifurcation and a pelvic angiogram was obtained.  This again shows the left and right common iliac.  Internal iliac and external iliac arteries were widely patent.  The left and right common femoral, profunda femoris, and superficial femoral arteries were patent.  Bilateral lower extremity runoff views  were obtained.  In the left lower extremity, the left superficial femoral artery was diffusely calcified.  There were 2 areas of approximately 30% stenosis but otherwise the vessel was patent.  The popliteal artery on the left side was patent.  The tibial vessels were not well visualized initially due to motion artifact.  In the right lower extremity, the right common femoral and profunda femoris artery was patent.  The right superficial femoral artery was patent with again 2 areas of 30% stenosis.  Popliteal artery was patent with again a 30% stenosis in the below-knee popliteal artery.  Again the tibial vessels were obscured due to motion artifact.  At this point, the 5-French pigtail catheter was removed over a guidewire and exchanged for a 5-French crossover catheter.  This was used to selectively catheterize the left common iliac artery and a guidewire was then advanced through the left common iliac to the left external iliac, left common femoral artery, and down into the left superficial femoral artery.  The crossover catheter was then exchanged for a 5-French straight catheter and this was advanced over the guidewire into the left superficial femoral artery.  Additional left lower extremity views were then obtained.  This again shows the left popliteal artery was patent.  The tibioperoneal trunk was occluded.  The posterior tibial and peroneal arteries were occluded.  These were all on the left leg.  The left anterior tibial artery was patent all the way to the level of the foot and a dorsalis pedis artery was the primary runoff vessel to the foot. There was an incomplete plantar arch.  Next a 5-French straight catheter was removed over a guidewire and additional views were obtained in the right lower extremity through the sheath.  This shows a patent right popliteal artery.  The right anterior tibial artery was occluded.  The right posterior tibial artery was occluded.  There  was one-vessel runoff via the peroneal artery to the right foot.  The posterior communicating branch was patent and fills the posterior tibial artery at the level of the ankle to supply the foot.  The patient tolerated the procedure well and there were no complications.  A 5-French sheath was left in place to be pulled in the holding area.  OPERATIVE FINDINGS:  Bilateral tibial artery occlusive disease with patent in-line flow to the right foot via the peroneal artery and to the left foot via the anterior tibial and dorsalis pedis artery.  The patient should have adequate perfusion for healing the toe amputation.  That is planned in the near future by Dr. Lajoyce Corners.  The patient will follow up with me on an as-needed basis.     Janetta Hora. Fields, MD     CEF/MEDQ  D:  07/09/2010  T:  07/09/2010  Job:  [id number]  cc:   Nadara Mustard, MD Jethro Bastos, M.D.  Electronically Signed by Fabienne Bruns MD on 07/13/2010 08:27:37 AM

## 2010-07-15 ENCOUNTER — Inpatient Hospital Stay (HOSPITAL_COMMUNITY)
Admission: RE | Admit: 2010-07-15 | Discharge: 2010-07-16 | DRG: 617 | Disposition: A | Discharge status: Home / Self Care | Payer: Medicare (Managed Care) | Source: Ambulatory Visit | Attending: Orthopedic Surgery | Admitting: Orthopedic Surgery

## 2010-07-15 ENCOUNTER — Other Ambulatory Visit: Payer: Self-pay | Admitting: Orthopedic Surgery

## 2010-07-15 DIAGNOSIS — N186 End stage renal disease: Secondary | ICD-10-CM | POA: Diagnosis present

## 2010-07-15 DIAGNOSIS — L02619 Cutaneous abscess of unspecified foot: Secondary | ICD-10-CM | POA: Diagnosis present

## 2010-07-15 DIAGNOSIS — Z951 Presence of aortocoronary bypass graft: Secondary | ICD-10-CM

## 2010-07-15 DIAGNOSIS — E1169 Type 2 diabetes mellitus with other specified complication: Principal | ICD-10-CM | POA: Diagnosis present

## 2010-07-15 DIAGNOSIS — Z79899 Other long term (current) drug therapy: Secondary | ICD-10-CM

## 2010-07-15 DIAGNOSIS — M9080 Osteopathy in diseases classified elsewhere, unspecified site: Secondary | ICD-10-CM | POA: Diagnosis present

## 2010-07-15 DIAGNOSIS — Z992 Dependence on renal dialysis: Secondary | ICD-10-CM

## 2010-07-15 DIAGNOSIS — M869 Osteomyelitis, unspecified: Secondary | ICD-10-CM | POA: Diagnosis present

## 2010-07-15 DIAGNOSIS — D649 Anemia, unspecified: Secondary | ICD-10-CM | POA: Diagnosis not present

## 2010-07-15 DIAGNOSIS — Z7902 Long term (current) use of antithrombotics/antiplatelets: Secondary | ICD-10-CM

## 2010-07-15 DIAGNOSIS — I2510 Atherosclerotic heart disease of native coronary artery without angina pectoris: Secondary | ICD-10-CM | POA: Diagnosis present

## 2010-07-15 DIAGNOSIS — N2581 Secondary hyperparathyroidism of renal origin: Secondary | ICD-10-CM | POA: Diagnosis present

## 2010-07-15 DIAGNOSIS — I120 Hypertensive chronic kidney disease with stage 5 chronic kidney disease or end stage renal disease: Secondary | ICD-10-CM | POA: Diagnosis present

## 2010-07-15 DIAGNOSIS — L03039 Cellulitis of unspecified toe: Secondary | ICD-10-CM | POA: Diagnosis present

## 2010-07-15 DIAGNOSIS — L97509 Non-pressure chronic ulcer of other part of unspecified foot with unspecified severity: Secondary | ICD-10-CM | POA: Diagnosis present

## 2010-07-15 LAB — PROTIME-INR
INR: 1.2 (ref 0.00–1.49)
Prothrombin Time: 15.4 seconds — ABNORMAL HIGH (ref 11.6–15.2)

## 2010-07-15 LAB — COMPREHENSIVE METABOLIC PANEL
Albumin: 4.1 g/dL (ref 3.5–5.2)
BUN: 26 mg/dL — ABNORMAL HIGH (ref 6–23)
Calcium: 11.3 mg/dL — ABNORMAL HIGH (ref 8.4–10.5)
Creatinine, Ser: 5.71 mg/dL — ABNORMAL HIGH (ref 0.4–1.2)
Glucose, Bld: 91 mg/dL (ref 70–99)
Total Protein: 8 g/dL (ref 6.0–8.3)

## 2010-07-15 LAB — CBC
MCV: 84.9 fL (ref 78.0–100.0)
Platelets: 278 10*3/uL (ref 150–400)
RBC: 4.65 MIL/uL (ref 3.87–5.11)
WBC: 6.7 10*3/uL (ref 4.0–10.5)

## 2010-07-15 LAB — APTT: aPTT: 33 seconds (ref 24–37)

## 2010-07-15 LAB — SURGICAL PCR SCREEN
MRSA, PCR: NEGATIVE
Staphylococcus aureus: POSITIVE — AB

## 2010-07-15 LAB — GLUCOSE, CAPILLARY
Glucose-Capillary: 87 mg/dL (ref 70–99)
Glucose-Capillary: 73 mg/dL (ref 70–99)
Glucose-Capillary: 181 mg/dL — ABNORMAL HIGH (ref 70–99)

## 2010-07-16 ENCOUNTER — Inpatient Hospital Stay (HOSPITAL_COMMUNITY): Payer: Medicare (Managed Care)

## 2010-07-16 LAB — RENAL FUNCTION PANEL
CO2: 27 mEq/L (ref 19–32)
Calcium: 10.6 mg/dL — ABNORMAL HIGH (ref 8.4–10.5)
Chloride: 95 mEq/L — ABNORMAL LOW (ref 96–112)
Creatinine, Ser: 7.52 mg/dL — ABNORMAL HIGH (ref 0.4–1.2)
GFR calc Af Amer: 7 mL/min — ABNORMAL LOW (ref >60)
GFR calc non Af Amer: 6 mL/min — ABNORMAL LOW (ref >60)
Glucose, Bld: 134 mg/dL — ABNORMAL HIGH (ref 70–99)
Sodium: 135 mEq/L (ref 135–145)

## 2010-07-16 LAB — CBC
HCT: 33.8 % — ABNORMAL LOW (ref 36.0–46.0)
Hemoglobin: 10.7 g/dL — ABNORMAL LOW (ref 12.0–15.0)
MCH: 26.6 pg (ref 26.0–34.0)
MCHC: 31.7 g/dL (ref 30.0–36.0)
RBC: 4.02 MIL/uL (ref 3.87–5.11)

## 2010-07-16 LAB — GLUCOSE, CAPILLARY
Glucose-Capillary: 143 mg/dL — ABNORMAL HIGH (ref 70–99)
Glucose-Capillary: 141 mg/dL — ABNORMAL HIGH (ref 70–99)

## 2010-07-22 NOTE — Op Note (Signed)
  NAMESAMIKA, VETSCH              ACCOUNT NO.:  [id number]  MEDICAL RECORD NO.:  [id number]           PATIENT TYPE:  I  LOCATION:  5016                         FACILITY:  [facility]  PHYSICIAN:  Nadara Mustard, MD     DATE OF BIRTH:  [birth year]  DATE OF PROCEDURE:  07/15/2010 DATE OF DISCHARGE:                              OPERATIVE REPORT   PREOPERATIVE DIAGNOSES:  Osteomyelitis, abscess, Wagner grade 3 ulcer, left second toe.  POSTOPERATIVE DIAGNOSES:  Osteomyelitis, abscess, Wagner grade 3 ulcer, left second toe.  PROCEDURE: 1. Left foot second ray amputation. 2. Application of ACell graft.  SURGEON:  Nadara Mustard, MD.  ANESTHESIA:  General.  ESTIMATED BLOOD LOSS:  Minimal.  ANTIBIOTICS:  One gram of Kefzol.  DRAINS:  None.  COMPLICATIONS:  None.  TOURNIQUET TIME:  None.  DISPOSITION:  To PACU in stable condition.  INDICATIONS FOR PROCEDURE:  The patient is a 55 year old woman, end- stage renal disease, on dialysis, with diabetes, who presents with osteomyelitis, abscess, Wagner grade 3 ulcer of the left second toe. She has failed conservative care and presents at this time for surgical intervention.  Risks and benefits were discussed including infection, neurovascular injury, nonhealing wound, need for higher-level amputation.  The patient states he understands and wished to proceed at this time.  DESCRIPTION OF PROCEDURE:  The patient was brought to OR room 10 and underwent a general LMA anesthetic.  After adequate level of anesthesia was obtained, the patient's left lower extremity was prepped using DuraPrep and draped into a sterile field.  A racket incision was made dorsally.  There was no plantar incision over the second ray.  The second ray was resected through the metatarsal shaft of the second metatarsal and the toe and metatarsal were resected in one block of tissue.  The wound was irrigated with normal saline.  There was no deep abscess.  There was  good petechial bleeding.  ACell powder was placed deep within the wound.  The wound was closed using 2-0 nylon and ACell was then applied over the wound.  The wound was then covered with Adaptic orthopedic sponges, Kerlix, and Coban.  The patient was extubated and taken to PACU in stable condition. Plan for dialysis tomorrow and discharge to home after dialysis.     Nadara Mustard, MD     MVD/MEDQ  D:  07/15/2010  T:  07/16/2010  Job:  [id number]  Electronically Signed by Aldean Baker MD on 07/22/2010 06:50:03 AM

## 2010-07-23 HISTORY — PX: CATARACT EXTRACTION: SUR2

## 2010-07-29 ENCOUNTER — Encounter (HOSPITAL_BASED_OUTPATIENT_CLINIC_OR_DEPARTMENT_OTHER): Payer: PRIVATE HEALTH INSURANCE | Attending: Internal Medicine

## 2010-07-29 DIAGNOSIS — L97509 Non-pressure chronic ulcer of other part of unspecified foot with unspecified severity: Secondary | ICD-10-CM | POA: Insufficient documentation

## 2010-07-29 DIAGNOSIS — Z992 Dependence on renal dialysis: Secondary | ICD-10-CM | POA: Insufficient documentation

## 2010-07-29 DIAGNOSIS — I2510 Atherosclerotic heart disease of native coronary artery without angina pectoris: Secondary | ICD-10-CM | POA: Insufficient documentation

## 2010-07-29 DIAGNOSIS — I120 Hypertensive chronic kidney disease with stage 5 chronic kidney disease or end stage renal disease: Secondary | ICD-10-CM | POA: Insufficient documentation

## 2010-07-29 DIAGNOSIS — Z7982 Long term (current) use of aspirin: Secondary | ICD-10-CM | POA: Insufficient documentation

## 2010-07-29 DIAGNOSIS — Z951 Presence of aortocoronary bypass graft: Secondary | ICD-10-CM | POA: Insufficient documentation

## 2010-07-29 DIAGNOSIS — E1142 Type 2 diabetes mellitus with diabetic polyneuropathy: Secondary | ICD-10-CM | POA: Insufficient documentation

## 2010-07-29 DIAGNOSIS — E1149 Type 2 diabetes mellitus with other diabetic neurological complication: Secondary | ICD-10-CM | POA: Insufficient documentation

## 2010-07-29 DIAGNOSIS — E1169 Type 2 diabetes mellitus with other specified complication: Secondary | ICD-10-CM | POA: Insufficient documentation

## 2010-07-29 DIAGNOSIS — Z8673 Personal history of transient ischemic attack (TIA), and cerebral infarction without residual deficits: Secondary | ICD-10-CM | POA: Insufficient documentation

## 2010-07-29 DIAGNOSIS — Z79899 Other long term (current) drug therapy: Secondary | ICD-10-CM | POA: Insufficient documentation

## 2010-07-29 DIAGNOSIS — N186 End stage renal disease: Secondary | ICD-10-CM | POA: Insufficient documentation

## 2010-07-29 DIAGNOSIS — I252 Old myocardial infarction: Secondary | ICD-10-CM | POA: Insufficient documentation

## 2010-08-27 ENCOUNTER — Other Ambulatory Visit (HOSPITAL_COMMUNITY): Payer: Self-pay | Admitting: Nephrology

## 2010-08-27 DIAGNOSIS — N186 End stage renal disease: Secondary | ICD-10-CM

## 2010-09-02 ENCOUNTER — Ambulatory Visit (HOSPITAL_COMMUNITY)
Admission: RE | Admit: 2010-09-02 | Discharge: 2010-09-02 | Disposition: A | Discharge status: Home / Self Care | Payer: PRIVATE HEALTH INSURANCE | Source: Ambulatory Visit | Attending: Nephrology | Admitting: Nephrology

## 2010-09-02 ENCOUNTER — Other Ambulatory Visit (HOSPITAL_COMMUNITY): Payer: Self-pay | Admitting: Nephrology

## 2010-09-02 DIAGNOSIS — N186 End stage renal disease: Secondary | ICD-10-CM | POA: Insufficient documentation

## 2010-09-02 DIAGNOSIS — Z86711 Personal history of pulmonary embolism: Secondary | ICD-10-CM | POA: Insufficient documentation

## 2010-09-02 DIAGNOSIS — T82898A Other specified complication of vascular prosthetic devices, implants and grafts, initial encounter: Secondary | ICD-10-CM | POA: Insufficient documentation

## 2010-09-02 DIAGNOSIS — N2581 Secondary hyperparathyroidism of renal origin: Secondary | ICD-10-CM | POA: Insufficient documentation

## 2010-09-02 DIAGNOSIS — Z8673 Personal history of transient ischemic attack (TIA), and cerebral infarction without residual deficits: Secondary | ICD-10-CM | POA: Insufficient documentation

## 2010-09-02 DIAGNOSIS — I2510 Atherosclerotic heart disease of native coronary artery without angina pectoris: Secondary | ICD-10-CM | POA: Insufficient documentation

## 2010-09-02 DIAGNOSIS — Y832 Surgical operation with anastomosis, bypass or graft as the cause of abnormal reaction of the patient, or of later complication, without mention of misadventure at the time of the procedure: Secondary | ICD-10-CM | POA: Insufficient documentation

## 2010-09-02 DIAGNOSIS — E119 Type 2 diabetes mellitus without complications: Secondary | ICD-10-CM | POA: Insufficient documentation

## 2010-09-02 DIAGNOSIS — I739 Peripheral vascular disease, unspecified: Secondary | ICD-10-CM | POA: Insufficient documentation

## 2010-09-02 MED ORDER — IOHEXOL 300 MG/ML  SOLN
100.0000 mL | Freq: Once | INTRAMUSCULAR | Status: AC | PRN
  Administered 2010-09-02: 60 mL via INTRAVENOUS

## 2010-09-28 ENCOUNTER — Other Ambulatory Visit: Payer: Self-pay | Admitting: Family Medicine

## 2010-09-28 DIAGNOSIS — M79672 Pain in left foot: Secondary | ICD-10-CM

## 2010-09-28 DIAGNOSIS — M25572 Pain in left ankle and joints of left foot: Secondary | ICD-10-CM

## 2010-10-02 ENCOUNTER — Ambulatory Visit
Admission: RE | Admit: 2010-10-02 | Discharge: 2010-10-02 | Disposition: A | Discharge status: Home / Self Care | Payer: PRIVATE HEALTH INSURANCE | Source: Ambulatory Visit | Attending: Family Medicine | Admitting: Family Medicine

## 2010-10-02 DIAGNOSIS — M25572 Pain in left ankle and joints of left foot: Secondary | ICD-10-CM

## 2010-10-02 DIAGNOSIS — M79672 Pain in left foot: Secondary | ICD-10-CM

## 2010-10-05 ENCOUNTER — Ambulatory Visit
Admission: RE | Admit: 2010-10-05 | Discharge: 2010-10-05 | Disposition: A | Discharge status: Home / Self Care | Payer: PRIVATE HEALTH INSURANCE | Source: Ambulatory Visit | Attending: Family Medicine | Admitting: Family Medicine

## 2010-10-05 ENCOUNTER — Other Ambulatory Visit: Payer: Self-pay | Admitting: Family Medicine

## 2010-10-05 DIAGNOSIS — R05 Cough: Secondary | ICD-10-CM

## 2010-11-11 LAB — BASIC METABOLIC PANEL
Chloride: 98
GFR calc Af Amer: 6 — ABNORMAL LOW
GFR calc non Af Amer: 5 — ABNORMAL LOW
Potassium: 4.6
Sodium: 141

## 2010-11-11 LAB — POCT I-STAT CREATININE
Creatinine, Ser: 8.1 — ABNORMAL HIGH
Operator id: 294341

## 2010-11-12 LAB — POCT I-STAT 4, (NA,K, GLUC, HGB,HCT)
Hemoglobin: 16 — ABNORMAL HIGH
Operator id: 181601
Sodium: 138

## 2010-11-26 LAB — POTASSIUM: Potassium: 4.6

## 2010-11-26 LAB — POCT I-STAT 4, (NA,K, GLUC, HGB,HCT)
Glucose, Bld: 166 mg/dL — ABNORMAL HIGH (ref 70–99)
HCT: 47 % — ABNORMAL HIGH (ref 36.0–46.0)
Hemoglobin: 16 g/dL — ABNORMAL HIGH (ref 12.0–15.0)

## 2010-11-26 LAB — GLUCOSE, CAPILLARY: Glucose-Capillary: 125 mg/dL — ABNORMAL HIGH (ref 70–99)

## 2010-12-02 ENCOUNTER — Other Ambulatory Visit (HOSPITAL_COMMUNITY): Payer: Self-pay | Admitting: Nephrology

## 2010-12-02 DIAGNOSIS — N186 End stage renal disease: Secondary | ICD-10-CM

## 2010-12-02 LAB — POCT I-STAT 4, (NA,K, GLUC, HGB,HCT): Glucose, Bld: 47 — ABNORMAL LOW

## 2010-12-16 ENCOUNTER — Ambulatory Visit (HOSPITAL_COMMUNITY): Admission: RE | Admit: 2010-12-16 | Payer: PRIVATE HEALTH INSURANCE | Source: Ambulatory Visit

## 2010-12-21 ENCOUNTER — Other Ambulatory Visit (HOSPITAL_COMMUNITY): Payer: Self-pay | Admitting: Nephrology

## 2010-12-21 ENCOUNTER — Ambulatory Visit (HOSPITAL_COMMUNITY)
Admission: RE | Admit: 2010-12-21 | Discharge: 2010-12-21 | Disposition: A | Discharge status: Home / Self Care | Payer: PRIVATE HEALTH INSURANCE | Source: Ambulatory Visit | Attending: Nephrology | Admitting: Nephrology

## 2010-12-21 ENCOUNTER — Other Ambulatory Visit (HOSPITAL_COMMUNITY): Payer: PRIVATE HEALTH INSURANCE

## 2010-12-21 DIAGNOSIS — T82898A Other specified complication of vascular prosthetic devices, implants and grafts, initial encounter: Secondary | ICD-10-CM | POA: Insufficient documentation

## 2010-12-21 DIAGNOSIS — N186 End stage renal disease: Secondary | ICD-10-CM

## 2010-12-21 DIAGNOSIS — Y849 Medical procedure, unspecified as the cause of abnormal reaction of the patient, or of later complication, without mention of misadventure at the time of the procedure: Secondary | ICD-10-CM | POA: Insufficient documentation

## 2010-12-21 DIAGNOSIS — Z452 Encounter for adjustment and management of vascular access device: Secondary | ICD-10-CM | POA: Insufficient documentation

## 2010-12-21 DIAGNOSIS — I120 Hypertensive chronic kidney disease with stage 5 chronic kidney disease or end stage renal disease: Secondary | ICD-10-CM | POA: Insufficient documentation

## 2010-12-21 MED ORDER — IOHEXOL 300 MG/ML  SOLN: 50.0000 mL | Freq: Once | INTRAMUSCULAR | Status: AC | PRN

## 2011-01-17 ENCOUNTER — Encounter (HOSPITAL_COMMUNITY): Payer: Self-pay | Admitting: Internal Medicine

## 2011-01-17 ENCOUNTER — Inpatient Hospital Stay (HOSPITAL_COMMUNITY)
Admission: AD | Admit: 2011-01-17 | Discharge: 2011-01-21 | DRG: 193 | Disposition: A | Discharge status: Home / Self Care | Payer: Medicare (Managed Care) | Source: Ambulatory Visit | Attending: Internal Medicine | Admitting: Internal Medicine

## 2011-01-17 DIAGNOSIS — M549 Dorsalgia, unspecified: Secondary | ICD-10-CM | POA: Diagnosis present

## 2011-01-17 DIAGNOSIS — Z951 Presence of aortocoronary bypass graft: Secondary | ICD-10-CM

## 2011-01-17 DIAGNOSIS — Z992 Dependence on renal dialysis: Secondary | ICD-10-CM | POA: Diagnosis present

## 2011-01-17 DIAGNOSIS — J189 Pneumonia, unspecified organism: Principal | ICD-10-CM | POA: Diagnosis present

## 2011-01-17 DIAGNOSIS — N2581 Secondary hyperparathyroidism of renal origin: Secondary | ICD-10-CM | POA: Diagnosis present

## 2011-01-17 DIAGNOSIS — K219 Gastro-esophageal reflux disease without esophagitis: Secondary | ICD-10-CM | POA: Diagnosis present

## 2011-01-17 DIAGNOSIS — E1129 Type 2 diabetes mellitus with other diabetic kidney complication: Secondary | ICD-10-CM | POA: Diagnosis present

## 2011-01-17 DIAGNOSIS — M869 Osteomyelitis, unspecified: Secondary | ICD-10-CM | POA: Insufficient documentation

## 2011-01-17 DIAGNOSIS — G629 Polyneuropathy, unspecified: Secondary | ICD-10-CM | POA: Diagnosis present

## 2011-01-17 DIAGNOSIS — N186 End stage renal disease: Secondary | ICD-10-CM | POA: Diagnosis present

## 2011-01-17 DIAGNOSIS — G2401 Drug induced subacute dyskinesia: Secondary | ICD-10-CM | POA: Insufficient documentation

## 2011-01-17 DIAGNOSIS — E1139 Type 2 diabetes mellitus with other diabetic ophthalmic complication: Secondary | ICD-10-CM | POA: Diagnosis present

## 2011-01-17 DIAGNOSIS — E1142 Type 2 diabetes mellitus with diabetic polyneuropathy: Secondary | ICD-10-CM | POA: Diagnosis present

## 2011-01-17 DIAGNOSIS — I25810 Atherosclerosis of coronary artery bypass graft(s) without angina pectoris: Secondary | ICD-10-CM | POA: Diagnosis present

## 2011-01-17 DIAGNOSIS — I639 Cerebral infarction, unspecified: Secondary | ICD-10-CM | POA: Insufficient documentation

## 2011-01-17 DIAGNOSIS — N058 Unspecified nephritic syndrome with other morphologic changes: Secondary | ICD-10-CM | POA: Diagnosis present

## 2011-01-17 DIAGNOSIS — F32A Depression, unspecified: Secondary | ICD-10-CM | POA: Diagnosis present

## 2011-01-17 DIAGNOSIS — I739 Peripheral vascular disease, unspecified: Secondary | ICD-10-CM | POA: Insufficient documentation

## 2011-01-17 DIAGNOSIS — I2510 Atherosclerotic heart disease of native coronary artery without angina pectoris: Secondary | ICD-10-CM | POA: Diagnosis present

## 2011-01-17 DIAGNOSIS — N189 Chronic kidney disease, unspecified: Secondary | ICD-10-CM | POA: Diagnosis present

## 2011-01-17 DIAGNOSIS — F329 Major depressive disorder, single episode, unspecified: Secondary | ICD-10-CM | POA: Diagnosis present

## 2011-01-17 DIAGNOSIS — I120 Hypertensive chronic kidney disease with stage 5 chronic kidney disease or end stage renal disease: Secondary | ICD-10-CM | POA: Diagnosis present

## 2011-01-17 DIAGNOSIS — E1149 Type 2 diabetes mellitus with other diabetic neurological complication: Secondary | ICD-10-CM | POA: Diagnosis present

## 2011-01-17 DIAGNOSIS — I5032 Chronic diastolic (congestive) heart failure: Secondary | ICD-10-CM | POA: Diagnosis present

## 2011-01-17 DIAGNOSIS — D631 Anemia in chronic kidney disease: Secondary | ICD-10-CM | POA: Diagnosis present

## 2011-01-17 DIAGNOSIS — I509 Heart failure, unspecified: Secondary | ICD-10-CM | POA: Diagnosis present

## 2011-01-17 DIAGNOSIS — K5900 Constipation, unspecified: Secondary | ICD-10-CM | POA: Diagnosis present

## 2011-01-17 DIAGNOSIS — K5909 Other constipation: Secondary | ICD-10-CM | POA: Diagnosis present

## 2011-01-17 DIAGNOSIS — E119 Type 2 diabetes mellitus without complications: Secondary | ICD-10-CM | POA: Diagnosis present

## 2011-01-17 DIAGNOSIS — E11319 Type 2 diabetes mellitus with unspecified diabetic retinopathy without macular edema: Secondary | ICD-10-CM | POA: Diagnosis present

## 2011-01-17 HISTORY — DX: Hyperlipidemia, unspecified: E78.5

## 2011-01-17 HISTORY — DX: Heart failure, unspecified: I50.9

## 2011-01-17 HISTORY — DX: Other pulmonary embolism without acute cor pulmonale: I26.99

## 2011-01-17 HISTORY — DX: Anemia in chronic kidney disease: D63.1

## 2011-01-17 HISTORY — DX: Other complications of anesthesia, initial encounter: T88.59XA

## 2011-01-17 HISTORY — DX: Acute myocardial infarction, unspecified: I21.9

## 2011-01-17 HISTORY — DX: Atherosclerosis of coronary artery bypass graft(s) without angina pectoris: I25.810

## 2011-01-17 HISTORY — DX: Drug induced subacute dyskinesia: G24.01

## 2011-01-17 HISTORY — DX: Major depressive disorder, single episode, unspecified: F32.9

## 2011-01-17 HISTORY — DX: Secondary hyperparathyroidism of renal origin: N25.81

## 2011-01-17 HISTORY — DX: Pneumonia, unspecified organism: J18.9

## 2011-01-17 HISTORY — DX: Cerebral infarction, unspecified: I63.9

## 2011-01-17 HISTORY — DX: Gastro-esophageal reflux disease without esophagitis: K21.9

## 2011-01-17 HISTORY — DX: Other constipation: K59.09

## 2011-01-17 HISTORY — DX: Peripheral vascular disease, unspecified: I73.9

## 2011-01-17 HISTORY — DX: Adverse effect of unspecified anesthetic, initial encounter: T41.45XA

## 2011-01-17 HISTORY — DX: Depression, unspecified: F32.A

## 2011-01-17 HISTORY — DX: Chronic diastolic (congestive) heart failure: I50.32

## 2011-01-17 HISTORY — DX: Polyneuropathy, unspecified: G62.9

## 2011-01-17 HISTORY — DX: Anemia in chronic kidney disease: N18.9

## 2011-01-17 LAB — COMPREHENSIVE METABOLIC PANEL
ALT: 35 U/L (ref 0–35)
AST: 40 U/L — ABNORMAL HIGH (ref 0–37)
Albumin: 3.5 g/dL (ref 3.5–5.2)
CO2: 30 mEq/L (ref 19–32)
Calcium: 10.1 mg/dL (ref 8.4–10.5)
Creatinine, Ser: 4.97 mg/dL — ABNORMAL HIGH (ref 0.50–1.10)
GFR calc Af Amer: 10 mL/min — ABNORMAL LOW (ref >90)
GFR calc non Af Amer: 9 mL/min — ABNORMAL LOW (ref >90)
Glucose, Bld: 155 mg/dL — ABNORMAL HIGH (ref 70–99)
Potassium: 4 mEq/L (ref 3.5–5.1)
Sodium: 137 mEq/L (ref 135–145)
Total Protein: 8.7 g/dL — ABNORMAL HIGH (ref 6.0–8.3)

## 2011-01-17 LAB — DIFFERENTIAL
Basophils Absolute: 0 10*3/uL (ref 0.0–0.1)
Basophils Relative: 0 % (ref 0–1)
Eosinophils Absolute: 0.3 10*3/uL (ref 0.0–0.7)
Lymphocytes Relative: 19 % (ref 12–46)
Neutro Abs: 6.1 10*3/uL (ref 1.7–7.7)
Neutrophils Relative %: 69 % (ref 43–77)

## 2011-01-17 LAB — CBC
HCT: 37.6 % (ref 36.0–46.0)
Hemoglobin: 12.4 g/dL (ref 12.0–15.0)
MCHC: 33 g/dL (ref 30.0–36.0)
Platelets: 250 10*3/uL (ref 150–400)
RDW: 17.2 % — ABNORMAL HIGH (ref 11.5–15.5)
WBC: 8.8 10*3/uL (ref 4.0–10.5)

## 2011-01-17 LAB — HEMOGLOBIN A1C
Hgb A1c MFr Bld: 10.6 % — ABNORMAL HIGH (ref <5.7)
Mean Plasma Glucose: 258 mg/dL — ABNORMAL HIGH (ref <117)

## 2011-01-17 LAB — GLUCOSE, CAPILLARY
Glucose-Capillary: 179 mg/dL — ABNORMAL HIGH (ref 70–99)
Glucose-Capillary: 153 mg/dL — ABNORMAL HIGH (ref 70–99)
Glucose-Capillary: 188 mg/dL — ABNORMAL HIGH (ref 70–99)

## 2011-01-17 LAB — MAGNESIUM: Magnesium: 2 mg/dL (ref 1.5–2.5)

## 2011-01-17 LAB — MRSA PCR SCREENING: MRSA by PCR: NEGATIVE

## 2011-01-17 LAB — PRO B NATRIURETIC PEPTIDE: Pro B Natriuretic peptide (BNP): 31334 pg/mL — ABNORMAL HIGH (ref 0–125)

## 2011-01-17 MED ORDER — IPRATROPIUM BROMIDE 0.02 % IN SOLN
0.5000 mg | RESPIRATORY_TRACT | Status: DC | PRN
Start: 2011-01-17 — End: 2011-01-21

## 2011-01-17 MED ORDER — DEXTROSE 5 % IV SOLN
1.0000 g | INTRAVENOUS | Status: DC
  Administered 2011-01-17: 1 g via INTRAVENOUS
  Filled 2011-01-17 (×2): qty 1

## 2011-01-17 MED ORDER — ALBUTEROL SULFATE (5 MG/ML) 0.5% IN NEBU: 2.5000 mg | INHALATION_SOLUTION | RESPIRATORY_TRACT | Status: DC | PRN

## 2011-01-17 MED ORDER — FENOFIBRATE 160 MG PO TABS
160.0000 mg | ORAL_TABLET | Freq: Every day | ORAL | Status: DC
  Administered 2011-01-17 – 2011-01-21 (×5): 160 mg via ORAL
  Filled 2011-01-17 (×5): qty 1

## 2011-01-17 MED ORDER — INSULIN ASPART 100 UNIT/ML ~~LOC~~ SOLN
0.0000 [IU] | Freq: Three times a day (TID) | SUBCUTANEOUS | Status: DC
  Administered 2011-01-17 – 2011-01-18 (×2): 2 [IU] via SUBCUTANEOUS
  Administered 2011-01-19: 1 [IU] via SUBCUTANEOUS
  Administered 2011-01-20: 3 [IU] via SUBCUTANEOUS
  Administered 2011-01-20: 2 [IU] via SUBCUTANEOUS
  Administered 2011-01-20: 9 [IU] via SUBCUTANEOUS
  Administered 2011-01-21: 2 [IU] via SUBCUTANEOUS
  Filled 2011-01-17: qty 3

## 2011-01-17 MED ORDER — RENA-VITE PO TABS
1.0000 | ORAL_TABLET | Freq: Every day | ORAL | Status: DC
  Administered 2011-01-17 – 2011-01-21 (×5): 1 via ORAL
  Filled 2011-01-17 (×5): qty 1

## 2011-01-17 MED ORDER — PROMETHAZINE HCL 25 MG PO TABS
25.0000 mg | ORAL_TABLET | Freq: Four times a day (QID) | ORAL | Status: DC | PRN
  Administered 2011-01-18: 25 mg via ORAL
  Filled 2011-01-17: qty 1

## 2011-01-17 MED ORDER — SODIUM CHLORIDE 0.9 % IV SOLN: 1500.0000 mg | INTRAVENOUS | Status: DC | PRN

## 2011-01-17 MED ORDER — ACETAMINOPHEN 325 MG PO TABS
650.0000 mg | ORAL_TABLET | Freq: Four times a day (QID) | ORAL | Status: DC | PRN
  Administered 2011-01-17 – 2011-01-21 (×2): 650 mg via ORAL
  Filled 2011-01-17 (×2): qty 2
  Filled 2011-01-17 (×2): qty 1

## 2011-01-17 MED ORDER — SEVELAMER CARBONATE 800 MG PO TABS
3200.0000 mg | ORAL_TABLET | Freq: Three times a day (TID) | ORAL | Status: DC
  Administered 2011-01-17 – 2011-01-21 (×8): 3200 mg via ORAL
  Filled 2011-01-17 (×14): qty 4

## 2011-01-17 MED ORDER — DEXTROSE 5 % IV SOLN: 1.0000 g | Freq: Three times a day (TID) | INTRAVENOUS | Status: DC

## 2011-01-17 MED ORDER — POLYETHYLENE GLYCOL 3350 17 G PO PACK
17.0000 g | PACK | Freq: Every day | ORAL | Status: DC
  Administered 2011-01-17 – 2011-01-19 (×3): 17 g via ORAL
  Filled 2011-01-17 (×5): qty 1

## 2011-01-17 MED ORDER — SODIUM CHLORIDE 0.9 % IJ SOLN
3.0000 mL | Freq: Two times a day (BID) | INTRAMUSCULAR | Status: DC
  Administered 2011-01-17 – 2011-01-19 (×5): 3 mL via INTRAVENOUS

## 2011-01-17 MED ORDER — LEVOFLOXACIN IN D5W 750 MG/150ML IV SOLN: 750.0000 mg | INTRAVENOUS | Status: DC

## 2011-01-17 MED ORDER — PREGABALIN 50 MG PO CAPS
50.0000 mg | ORAL_CAPSULE | Freq: Every evening | ORAL | Status: DC
  Administered 2011-01-17 – 2011-01-21 (×5): 50 mg via ORAL
  Filled 2011-01-17 (×6): qty 1

## 2011-01-17 MED ORDER — HYDROCODONE-ACETAMINOPHEN 5-325 MG PO TABS
1.0000 | ORAL_TABLET | ORAL | Status: DC | PRN
  Administered 2011-01-17 – 2011-01-21 (×5): 2 via ORAL
  Filled 2011-01-17 (×5): qty 2

## 2011-01-17 MED ORDER — SODIUM CHLORIDE 0.9 % IV SOLN: 250.0000 mL | INTRAVENOUS | Status: DC

## 2011-01-17 MED ORDER — CINACALCET HCL 30 MG PO TABS
90.0000 mg | ORAL_TABLET | Freq: Every day | ORAL | Status: DC
  Administered 2011-01-18 – 2011-01-21 (×4): 90 mg via ORAL
  Filled 2011-01-17 (×5): qty 3

## 2011-01-17 MED ORDER — PANTOPRAZOLE SODIUM 40 MG PO TBEC
40.0000 mg | DELAYED_RELEASE_TABLET | Freq: Every day | ORAL | Status: DC
  Administered 2011-01-18 – 2011-01-21 (×4): 40 mg via ORAL
  Filled 2011-01-17 (×4): qty 1

## 2011-01-17 MED ORDER — BENZONATATE 100 MG PO CAPS
200.0000 mg | ORAL_CAPSULE | Freq: Three times a day (TID) | ORAL | Status: DC | PRN
  Administered 2011-01-17 – 2011-01-20 (×3): 200 mg via ORAL
  Filled 2011-01-17 (×3): qty 2

## 2011-01-17 MED ORDER — GABAPENTIN 400 MG PO CAPS
400.0000 mg | ORAL_CAPSULE | ORAL | Status: DC
  Administered 2011-01-18 – 2011-01-21 (×4): 400 mg via ORAL
  Filled 2011-01-17 (×5): qty 1

## 2011-01-17 MED ORDER — GLIMEPIRIDE 4 MG PO TABS
4.0000 mg | ORAL_TABLET | ORAL | Status: DC
  Administered 2011-01-18 – 2011-01-19 (×2): 4 mg via ORAL
  Filled 2011-01-17 (×4): qty 1

## 2011-01-17 MED ORDER — SODIUM CHLORIDE 0.9 % IJ SOLN: 3.0000 mL | INTRAMUSCULAR | Status: DC | PRN

## 2011-01-17 MED ORDER — INSULIN GLARGINE 100 UNIT/ML ~~LOC~~ SOLN
10.0000 [IU] | Freq: Every day | SUBCUTANEOUS | Status: DC
  Administered 2011-01-17: 10 [IU] via SUBCUTANEOUS
  Filled 2011-01-17: qty 3

## 2011-01-17 MED ORDER — VANCOMYCIN HCL 1000 MG IV SOLR
1500.0000 mg | Freq: Once | INTRAVENOUS | Status: AC
  Administered 2011-01-17: 1500 mg via INTRAVENOUS
  Filled 2011-01-17: qty 1500

## 2011-01-17 MED ORDER — POLYVINYL ALCOHOL 1.4 % OP SOLN
1.0000 [drp] | Freq: Every day | OPHTHALMIC | Status: DC | PRN
  Filled 2011-01-17: qty 15

## 2011-01-17 MED ORDER — HEPARIN SODIUM (PORCINE) 5000 UNIT/ML IJ SOLN
5000.0000 [IU] | Freq: Three times a day (TID) | INTRAMUSCULAR | Status: DC
  Administered 2011-01-17 – 2011-01-21 (×11): 5000 [IU] via SUBCUTANEOUS
  Filled 2011-01-17 (×14): qty 1

## 2011-01-17 MED ORDER — AMLODIPINE BESYLATE 5 MG PO TABS
5.0000 mg | ORAL_TABLET | Freq: Every day | ORAL | Status: DC
  Administered 2011-01-17 – 2011-01-18 (×2): 5 mg via ORAL
  Filled 2011-01-17 (×2): qty 1

## 2011-01-17 MED ORDER — VANCOMYCIN HCL 1000 MG IV SOLR: 1500.0000 mg | Freq: Once | INTRAVENOUS | Status: DC

## 2011-01-17 MED ORDER — PHENOL 1.4 % MT LIQD
1.0000 | OROMUCOSAL | Status: DC | PRN
  Administered 2011-01-18: 1 via OROMUCOSAL
  Filled 2011-01-17: qty 177

## 2011-01-17 MED ORDER — ASPIRIN 81 MG PO CHEW
81.0000 mg | CHEWABLE_TABLET | Freq: Every day | ORAL | Status: DC
  Administered 2011-01-17 – 2011-01-21 (×5): 81 mg via ORAL
  Filled 2011-01-17 (×6): qty 1

## 2011-01-17 MED ORDER — SEVELAMER HCL 800 MG PO TABS
3200.0000 mg | ORAL_TABLET | Freq: Three times a day (TID) | ORAL | Status: DC
  Administered 2011-01-18: 3200 mg via ORAL
  Filled 2011-01-17 (×14): qty 4

## 2011-01-17 NOTE — Consults (Signed)
ANTIBIOTIC CONSULT NOTE - INITIAL  Pharmacy Consult for Vancomycin, Cefepime  Indication: rule out pneumonia  Allergies  Allergen Reactions  . Codeine Other (See Comments)    unknown  . Lipitor (Atorvastatin Calcium) Other (See Comments)    unknown  . Reglan Other (See Comments)    unknown    Patient Measurements:   Adjusted Body Weight: 79 kg  Vital Signs: Temp: 100.3 F (37.9 C) (11/26 1400) Temp src: Oral (11/26 1400) BP: 111/58 mmHg (11/26 1400) Pulse Rate: 80  (11/26 1400) Intake/Output from previous day:   Intake/Output from this shift:    Labs: No results found for this basename: WBC:3,HGB:3,PLT:3,LABCREA:3,CREATININE:3 in the last 72 hours CrCl is unknown because there is no height on file for the current visit. No results found for this basename: VANCOTROUGH:2,VANCOPEAK:2,VANCORANDOM:2,GENTTROUGH:2,GENTPEAK:2,GENTRANDOM:2,TOBRATROUGH:2,TOBRAPEAK:2,TOBRARND:2,AMIKACINPEAK:2,AMIKACINTROU:2,AMIKACIN:2, in the last 72 hours   Microbiology: Recent Results (from the past 720 hour(s))  MRSA PCR SCREENING     Status: Normal   Collection Time   01/17/11  2:39 PM      Component Value Range Status Comment   MRSA by PCR NEGATIVE  NEGATIVE  Final     Medical History: Past Medical History  Diagnosis Date  . ESRD (end stage renal disease) on dialysis   . DM II (diabetes mellitus, type II), controlled   . Hyperparathyroidism, secondary renal   . CAD (coronary artery disease) of artery bypass graft     CABG in 2002  . PAD (peripheral artery disease)     Not a candidate for revascularization  . Chronic constipation   . GERD (gastroesophageal reflux disease)   . Anemia of chronic renal failure   . Tardive dyskinesia     Secondary to Reglan  . CVA (cerebral infarction)     right brain with minimal left hemiparesis  . Depression   . Neuropathy   . Diabetic retinopathy   . Diastolic CHF, chronic     Grade 2  . History of pneumonia 10/2009    two admissions  .  Pulmonary embolism 06/2003    Medications:  Prescriptions prior to admission  Medication Sig Dispense Refill  . acetaminophen (TYLENOL ARTHRITIS PAIN) 650 MG CR tablet Take 1,300 mg by mouth every 8 (eight) hours as needed. For pain       . amLODipine (NORVASC) 5 MG tablet Take 5 mg by mouth daily.        Marland Kitchen aspirin 81 MG chewable tablet Chew 81 mg by mouth daily.        . benzonatate (TESSALON) 200 MG capsule Take 200 mg by mouth 3 (three) times daily as needed. For cough      . cinacalcet (SENSIPAR) 90 MG tablet Take 90 mg by mouth daily.        . fenofibrate micronized (LOFIBRA) 134 MG capsule Take 134 mg by mouth daily.        Marland Kitchen gabapentin (NEURONTIN) 400 MG capsule Take 400 mg by mouth every morning.        Marland Kitchen glimepiride (AMARYL) 4 MG tablet Take 4 mg by mouth every morning.        Marland Kitchen HYDROcodone-acetaminophen (VICODIN) 5-500 MG per tablet Take 1 tablet by mouth 2 (two) times daily.        . insulin glargine (LANTUS) 100 UNIT/ML injection Inject 10 Units into the skin at bedtime.        . Menthol, Topical Analgesic, (BIOFREEZE ROLL-ON) 4 % GEL Apply 1 application topically daily as needed. For back pain       .  moxifloxacin (AVELOX) 400 MG tablet Take 400 mg by mouth daily. X 10 days       . multivitamin (RENA-VIT) TABS tablet Take 1 tablet by mouth daily.        . pantoprazole (PROTONIX) 40 MG tablet Take 40 mg by mouth daily.        . polyethylene glycol (MIRALAX / GLYCOLAX) packet Take 17 g by mouth daily.        . polyvinyl alcohol (LIQUIFILM TEARS) 1.4 % ophthalmic solution Place 1 drop into both eyes daily as needed. For dryness       . pregabalin (LYRICA) 50 MG capsule Take 50 mg by mouth every evening.        . promethazine (PHENERGAN) 25 MG tablet Take 25 mg by mouth every 6 (six) hours as needed. For nausea       . sevelamer (RENAGEL) 800 MG tablet Take 3,200 mg by mouth 3 (three) times daily with meals.        . sodium phosphates (FLEET) 18-48 GM/100ML solution Take 100 mLs by  mouth daily.         Assessment: Patient is a 55 y/o female with history of ESRD admitted for suspected pneumonia.  Vancomycin and Cefepime per protocol have been ordered.  Goal of Therapy:  Pre-HD Vancomycin level 15-25 mcg/ml. Cefepime adjusted for renal dysfunction.  Plan:  Vancomycin 1500mg  IV x 1. Cefepime 1 gm IV q 24hr.  Shaniquia Brafford, Elisha Headland, Pharm.D. 01/17/2011 4:41 PM

## 2011-01-17 NOTE — Progress Notes (Signed)
Pt arrived as a direct admit. Pt is from home with her husband, and has her home walker at bedside. Pt has some slight left sided weakness and uses her walker to ambulate. Pt has no skin issues, and no complaints of pain. Pt's voice is hoarse and pt says that she usually has a regular voice. Pt has a RUA fistula that's positive for bruit and thrill. Pt is Anuric and has watched her safety video. Will continue to monitor.

## 2011-01-17 NOTE — H&P (Addendum)
Hospital Admission Note Date: 01/17/2011  Patient name: Faith [name] Medical record number: [id number] Date of birth: 1955/05/31 Age: 55 y.o. Gender: female PCP: Thane Edu, MD  Medical Service: Int Med  Attending physician:  Rogelia Boga    Pager: Resident (R2/R3): [place]     Pager: [phone] Resident (R1): [place]    Pager: [phone]  Chief Complaint: cough and sore throat  History of Present Illness: Patient is a 55 year old woman who is a [facility] patient with a history of ESRD, CAD, diastolic CHF, and diabetes who presents with a three-week history of cough, rhinorrhea, and sore throat. She presented to her PCP on Friday, who gave her a shot of ceftriaxone and a prescription for moxifloxacin. She did not have a CXR, but was presumed to have possible RLL pneumonia. The patient revisited to her PCP today, and was not doing any better, including a loss of voice, right ear pain, and nausea/vomiting. The patient says that she's been coughing up clear sputum since Friday, denies fevers or chills. She says that her husband has also been sick with a productive cough, but that he got sick after she did. She's been taking some Vicodin and acetaminophen, but the antibiotics prescribed to her have not been helping that much.  Additionally, the patient endorses roughly 4 months of lower back pain on the left worse than the right, for which her PCP has been prescribing her Vicodin and following.  Past Medical History: Past Medical History  Diagnosis Date  . ESRD (end stage renal disease) on dialysis   . Hyperparathyroidism, secondary renal   . CAD (coronary artery disease) of artery bypass graft     CABG in 2002  . PAD (peripheral artery disease)     Not a candidate for revascularization  . Chronic constipation   . GERD (gastroesophageal reflux disease)   . Anemia of chronic renal failure   . Tardive dyskinesia     Secondary to Reglan  . Depression   . Neuropathy   . Diabetic  retinopathy   . Diastolic CHF, chronic     Grade 2  . Pulmonary embolism 06/2003  . Complication of anesthesia     "sleepy afterwards; due to her being dialysis pt"  . Hyperlipidemia   . Myocardial infarction 2002  . Stroke 2002    "w/left side" residual weakness  . Dialysis patient 01/17/11    [name]; M, W, F; via right upper arm AVF  . Diabetes mellitus     type II  . CHF (congestive heart failure)   . CVA (cerebral infarction)     right brain with minimal left hemiparesis  . Pneumonia 10/2009; 11/2009    ?pneumonia 01/17/11   Past Surgical History  Procedure Date  . Coronary artery bypass graft 2002  . Laparoscopic cholecystectomy 2002  . Av fistula placement     times 4  . Trigger finger release   . Tubal ligation   . Eye examination under anesthesia w/ retinal cryotherapy and retinal laser   . Coronary angioplasty with stent placement   . Breast biopsy 06/2010  . Cataract extraction 07/2010    right sided   . Toe amputation     left 2nd toe secondary to osteomyelitis    Meds: Current Facility-Administered Medications  Medication Dose Route Frequency Provider Last Rate Last Dose  . acetaminophen (TYLENOL) tablet 650 mg  650 mg Oral Q6H PRN Leodis Sias      . albuterol (PROVENTIL) (5 MG/ML) 0.5% nebulizer solution 2.5  mg  2.5 mg Nebulization Q4H PRN Leodis Sias       And  . ipratropium (ATROVENT) nebulizer solution 0.5 mg  0.5 mg Nebulization Q4H PRN Leodis Sias      . amLODipine (NORVASC) tablet 5 mg  5 mg Oral Daily Leodis Sias      . aspirin chewable tablet 81 mg  81 mg Oral Daily Leodis Sias      . benzonatate (TESSALON) capsule 200 mg  200 mg Oral TID PRN Leodis Sias      . ceFEPIme (MAXIPIME) 1 g in dextrose 5 % 50 mL IVPB  1 g Intravenous Q24H Laurena Bering, PHARMD   1 g at 01/17/11 1833  . cinacalcet (SENSIPAR) tablet 90 mg  90 mg Oral Q breakfast Leodis Sias      . fenofibrate tablet 160 mg  160  mg Oral Daily Christopher Pribula      . gabapentin (NEURONTIN) capsule 400 mg  400 mg Oral QAM Christopher Pribula      . glimepiride (AMARYL) tablet 4 mg  4 mg Oral QAM Christopher Pribula      . heparin injection 5,000 Units  5,000 Units Subcutaneous Q8H Christopher Pribula      . HYDROcodone-acetaminophen (NORCO) 5-325 MG per tablet 1-2 tablet  1-2 tablet Oral Q4H PRN Leodis Sias      . insulin aspart (novoLOG) injection 0-9 Units  0-9 Units Subcutaneous TID WC Christopher Pribula   2 Units at 01/17/11 1833  . insulin glargine (LANTUS) injection 10 Units  10 Units Subcutaneous QHS Leodis Sias      . multivitamin (RENA-VIT) tablet 1 tablet  1 tablet Oral Daily Leodis Sias      . pantoprazole (PROTONIX) EC tablet 40 mg  40 mg Oral Q1200 Leodis Sias      . phenol (CHLORASEPTIC) mouth spray 1 spray  1 spray Mouth/Throat PRN Leodis Sias      . polyethylene glycol (MIRALAX / GLYCOLAX) packet 17 g  17 g Oral Daily Christopher Pribula   17 g at 01/17/11 1824  . polyvinyl alcohol (LIQUIFILM TEARS) 1.4 % ophthalmic solution 1 drop  1 drop Both Eyes Daily PRN Leodis Sias      . pregabalin (LYRICA) capsule 50 mg  50 mg Oral QPM Leodis Sias      . promethazine (PHENERGAN) tablet 25 mg  25 mg Oral Q6H PRN Leodis Sias      . sevelamer (RENAGEL) tablet 3,200 mg  3,200 mg Oral TID WC Christopher Pribula      . sodium chloride 0.9 % injection 3 mL  3 mL Intravenous Q12H Christopher Pribula      . sodium chloride 0.9 % injection 3 mL  3 mL Intravenous PRN Leodis Sias      . vancomycin (VANCOCIN) 1,500 mg in sodium chloride 0.9 % 250 mL IVPB  1,500 mg Intravenous Once Earle J Stramoski, PHARMD      . DISCONTD: 0.9 %  sodium chloride infusion  250 mL Intravenous Continuous Leodis Sias      . DISCONTD: ceFEPIme (MAXIPIME) 1 g in dextrose 5 % 50 mL IVPB  1 g Intravenous Q8H Christopher Pribula      . DISCONTD: Levofloxacin (LEVAQUIN)  IVPB 750 mg  750 mg Intravenous Q24H Leodis Sias      . DISCONTD: vancomycin (VANCOCIN) 1,500 mg in sodium chloride 0.9 % 250 mL IVPB  1,500 mg Intravenous Q hemodialysis Laurena Bering, PHARMD      . DISCONTD: vancomycin (  VANCOCIN) 1,500 mg in sodium chloride 0.9 % 500 mL IVPB  1,500 mg Intravenous Once Earle J Stramoski, PHARMD        Allergies: Codeine; Lipitor; and Reglan  Family Hx: Family History  Problem Relation Age of Onset  . Diabetes Mother   . Cirrhosis Mother   . Diabetes Father   . Heart attack Father   . Diabetes Sister   . Diabetes Sister   . Diabetes Sister     Social Hx: History   Social History  . Marital Status: Married    Spouse Name: N/A    Number of Children: N/A  . Years of Education: N/A   Occupational History  . Not on file.   Social History Main Topics  . Smoking status: Never Smoker   . Smokeless tobacco: Never Used  . Alcohol Use: No  . Drug Use: Yes    Special: Marijuana     "quit smoking weed years ago"  . Sexually Active: Yes   Other Topics Concern  . Not on file   Social History Narrative   Married for 36 years to her only husband.  She used to do factory work, Geophysicist/field seismologist, and Conservation officer, nature.  Originally from [place], [place] but lives in [place] now.  Has 1 son that is healthy.    Review of Systems: As per history of present illness  Physical Exam: Filed Vitals:   01/17/11 1400  BP: 111/58  Pulse: 80  Temp: 100.3 F (37.9 C)  TempSrc: Oral  Resp: 16  SpO2: 98%   GEN: No apparent distress.  Alert and oriented x 3.  Pleasant, conversant, and cooperative to exam. HEENT: head is autraumatic and normocephalic.  Neck is supple without palpable masses or lymphadenopathy.  No JVD or carotid bruits.  EOMI.  PERRLA.  Sclerae anicteric.  Conjunctivae without pallor or injection. Mucous membranes are moist.  Oropharynx is without erythema, exudates, or other abnormal lesions.  TM on L clear.  TM on right not visualized 2/2  cerumen. RESP:  Rhonchi in bilateral mid and lower lung field. CARDIOVASCULAR: regular rate, normal rhythm.  Clear S1, S2, no murmurs, gallops, or rubs. ABDOMEN: soft, non-tender, non-distended.  Bowels sounds present in all quadrants and normoactive.  No palpable masses. EXT: warm and dry.  Peripheral pulses equal, intact, and +2 globally.  No clubbing or cyanosis.  No edema in b/l lower extremeties SKIN: warm and dry with normal turgor.  No rashes or abnormal lesions observed. NEURO: Some baseline perpetual tongue motion.  Ambulating without difficulty, moving 4 extremities freely, communicating well.  Lab results: Basic Metabolic Panel:  [facility] 01/17/11 1655  NA 137  K 4.0  CL 93*  CO2 30  GLUCOSE 155*  BUN 22  CREATININE 4.97*  CALCIUM 10.1  MG 2.0  PHOS 3.9   Liver Function Tests:  Basename 01/17/11 1655  AST 40*  ALT 35  ALKPHOS 90  BILITOT 0.3  PROT 8.7*  ALBUMIN 3.5   CBC:  Basename 01/17/11 1655  WBC 8.8  NEUTROABS 6.1  HGB 12.4  HCT 37.6  MCV 82.6  PLT 250   BNP:  Basename 01/17/11 1655  POCBNP 31334.0*   D-Dimer: No results found for this basename: DDIMER:2 in the last 72 hours CBG:  Basename 01/17/11 1640 01/17/11 1547  GLUCAP 153* 179*    Imaging results:  No results found.  Assessment & Plan by Problem:  # Cough Given clinical presentation, possibly pneumonia versus URI. Do not have CXR her CBC  currently.  With her exposure to dialysis and lack of response to oral antibiotics, we will treat for HCAP and monitor her clinical response.  She's been afebrile, with good oxygen saturation and coughing only white sputum, but given her lack of improvement we will need to treat.we will start with some more workup, and continue with further diagnostics.  - CXR  - CBC, bmet - Sputum culture  - Blood culture - Vancomycin - Cefepime - Levofloxacin - Nebs when necessary  - Zofran - strep, legionella ag  # Back pain  Likely mechanical in  nature, the differential for lower back pain and this patient remains broad, including muscle skeletal, oncologic, and various other diagnoses. We'll have to evaluate to decide if imaging is necessary, will wait for the primary team to discuss. - consider plain film - PT/OT once improving - pain control  # ESRD on dialysis Patient normally on MWF HD, and it got dialysis this morning. Will consult nephrology and continue with scheduled Ht. 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  - lantus 10u qhs - SSI - con't gabapentin  # Secondary hyperparathyroidism - f/u BMP  - f/u bmet - PTH  # HTN - norvasc  # Peripheral artery disease - extremities appear well-perfused now   # Chronic constipation Episode of recent loose BM, if continued loose BM consider holding home constipation meds   # GERD - con't pantoprazole

## 2011-01-17 NOTE — H&P (Signed)
Hospital Admission Note Date: 01/17/2011  Patient name: Faith [name] Medical record number: [id number] Date of birth: May 12, 1955 Age: 55 y.o. Gender: female PCP: Thane Edu, MD  Medical Service:  Attending physician:     1st Contact:     Pager: 2nd Contact:     Pager: After 5 pm or weekends: 1st Contact:      Pager: [phone] 2nd Contact:      Pager: [phone]  Chief Complaint: cough and sore throat  History of Present Illness: Pt reports a 3 week history of a cough, with sore throat of 2 weeks duration. She presented to her PCP on Friday who gave her a shot of ceftriaxone and a prescription for moxifloxacin for presumptive RLL pneumonia (rapid strep negative then). Given that she was worse when he saw her today ("lost voice, sore all over"), he sent her in to the hospital for further evaluation. Patient also endorses ear pain (R>L) and nausea/vomiting that began on Friday, with her last episode of emesis on Saturday. She endorses a headache of 2 weeks duration, and loose bowel movements on Friday. She is reportedly coughing up clear sputum. She denies chills and subjective fever. She reports her husband has also been sick with a productive cough, but that his symptoms started after her respiratory symptoms. She has been taking vicodin and acetaminophen for the pain, which has helped. Her back pain today has been 6-7/10, and her throat pain 7/10.  The patient also endorses back pain of approximately four months duration, which has gotten significantly worse in the last month. Her PCP is following this and has prescribed her the vicodin for symptomatic treatment.  Patient received dialysis this morning.   Meds: Medications Prior to Admission  Medication Dose Route Frequency Provider Last Rate Last Dose   0.9 %  sodium chloride infusion  250 mL Intravenous Continuous Christopher Pribula       albuterol (PROVENTIL) (5 MG/ML) 0.5% nebulizer solution 2.5 mg  2.5 mg  Nebulization Q4H PRN Leodis Sias       And   ipratropium (ATROVENT) nebulizer solution 0.5 mg  0.5 mg Nebulization Q4H PRN Christopher Pribula       amLODipine (NORVASC) tablet 5 mg  5 mg Oral Daily Christopher Pribula       aspirin chewable tablet 81 mg  81 mg Oral Daily Christopher Pribula       benzonatate (TESSALON) capsule 200 mg  200 mg Oral TID PRN Leodis Sias       cinacalcet (SENSIPAR) tablet 90 mg  90 mg Oral Q breakfast Christopher Pribula       fenofibrate tablet 160 mg  160 mg Oral Daily Christopher Pribula       gabapentin (NEURONTIN) capsule 400 mg  400 mg Oral QAM Christopher Pribula       glimepiride (AMARYL) tablet 4 mg  4 mg Oral QAM Christopher Pribula       heparin injection 5,000 Units  5,000 Units Subcutaneous Q8H Christopher Pribula       HYDROcodone-acetaminophen (NORCO) 5-325 MG per tablet 1-2 tablet  1-2 tablet Oral Q4H PRN Christopher Pribula       insulin aspart (novoLOG) injection 0-9 Units  0-9 Units Subcutaneous TID WC Christopher Pribula       insulin glargine (LANTUS) injection 10 Units  10 Units Subcutaneous QHS Christopher Pribula       multivitamin (RENA-VIT) tablet 1 tablet  1 tablet Oral Daily Christopher Pribula       pantoprazole (PROTONIX) EC  tablet 40 mg  40 mg Oral Q1200 Christopher Pribula       phenol (CHLORASEPTIC) mouth spray 1 spray  1 spray Mouth/Throat PRN Christopher Pribula       polyethylene glycol (MIRALAX / GLYCOLAX) packet 17 g  17 g Oral Daily Christopher Pribula       polyvinyl alcohol (LIQUIFILM TEARS) 1.4 % ophthalmic solution 1 drop  1 drop Both Eyes Daily PRN Christopher Pribula       pregabalin (LYRICA) capsule 50 mg  50 mg Oral QPM Christopher Pribula       promethazine (PHENERGAN) tablet 25 mg  25 mg Oral Q6H PRN Christopher Pribula       sevelamer (RENAGEL) tablet 3,200 mg  3,200 mg Oral TID WC Christopher Pribula       sodium chloride 0.9 % injection 3 mL  3 mL Intravenous Q12H Christopher  Pribula       sodium chloride 0.9 % injection 3 mL  3 mL Intravenous PRN Leodis Sias       DISCONTD: ceFEPIme (MAXIPIME) 1 g in dextrose 5 % 50 mL IVPB  1 g Intravenous Q8H Christopher Pribula       DISCONTD: Levofloxacin (LEVAQUIN) IVPB 750 mg  750 mg Intravenous Q24H Leodis Sias       No current outpatient prescriptions on file as of 01/17/2011.    Allergies: Codeine; Lipitor; and Reglan Past Medical History  Diagnosis Date   ESRD (end stage renal disease) on dialysis    Hyperparathyroidism, secondary renal    CAD (coronary artery disease) of artery bypass graft     CABG in 2002   PAD (peripheral artery disease)     Not a candidate for revascularization   Chronic constipation    GERD (gastroesophageal reflux disease)    Anemia of chronic renal failure    Tardive dyskinesia     Secondary to Reglan   Depression    Neuropathy    Diabetic retinopathy    Diastolic CHF, chronic     Grade 2   Pulmonary embolism 06/2003   Complication of anesthesia     "sleepy afterwards; due to her being dialysis pt"   Hyperlipidemia    Myocardial infarction 2002   Stroke 2002    "w/left side" residual weakness   Dialysis patient 01/17/11    [name]; M, W, F; via right upper arm AVF   Diabetes mellitus     type II   CHF (congestive heart failure)    CVA (cerebral infarction)     right brain with minimal left hemiparesis   Pneumonia 10/2009; 11/2009    ?pneumonia 01/17/11   Past Surgical History  Procedure Date   Coronary artery bypass graft 2002   Laparoscopic cholecystectomy 2002   Av fistula placement     times 4   Trigger finger release    Tubal ligation    Eye examination under anesthesia w/ retinal cryotherapy and retinal laser    Coronary angioplasty with stent placement    Breast biopsy 06/2010   Cataract extraction 07/2010    right sided    Toe amputation     left 2nd toe secondary to osteomyelitis   Family History    Problem Relation Age of Onset   Diabetes Mother    Cirrhosis Mother    Diabetes Father    Heart attack Father    Diabetes Sister    Diabetes Sister    Diabetes Sister    History   Social History  Marital Status: Married    Spouse Name: N/A    Number of Children: N/A   Years of Education: N/A   Occupational History   Not on file.   Social History Main Topics   Smoking status: Never Smoker    Smokeless tobacco: Never Used   Alcohol Use: No   Drug Use: Yes    Special: Marijuana     "quit smoking weed years ago"   Sexually Active: Yes   Other Topics Concern   Not on file   Social History Narrative   Married for 36 years to her only husband.  She used to do factory work, Geophysicist/field seismologist, and Conservation officer, nature.  Originally from [place], [place] but lives in [place] now.  Has 1 son that is healthy.    Review of Systems: Pertinent items are noted in HPI.  Physical Exam: Blood pressure 111/58, pulse 80, temperature 100.3 F (37.9 C), temperature source Oral, resp. rate 16, SpO2 98.00%. Gen: patient resting comfortably in bed, in apparent mild distress w/cough, intermittent tongue protrusion c/w akathesia, and speaking in raspy whisper Eyes: muddy sclera; pupils equal, round, and minimally reactive; EOMI Oropharynx: MMM, exudate/erythema/inflammation), superior part of posterior oropharynx visualized w/o exudate or erythema (mostly obscured) Ears: Wax obscuring tympanic membrane on R (where pain is reported). On L: significant wax, small amount of blood streaking superior ext canal, tympanic membrane appears translucent w/o erythema CV: RRR, N S1 and S2, no murmurs appreciated Resp: course breath sounds and crackles appreciated in all lung fields, breathing appears is labored and interrupted by cough Abd: +BS, soft, tenderness to deep palpation in RLQ. Mild R costovertebral tenderness. Ext: no lower ext edema noted, warm LE's.  Lab results: CBG:  Basename 01/17/11  1547  GLUCAP 179*    Assessment & Plan by Problem: 1. Respiratory infection: cough, sore throat, course breath sounds, labored breathing and fever consistent with upper and lower respiratory infection. Afebrile but elevated temp at 37.9, good sat at 98%. Given pt is on dialysis and symptoms are refractory to initial treatment with ceftriaxone and moxifloxacin, likely healthcare-associated pneumonia. First steps for workup include obtaining CXR, cultures, basic chemistries, and sputum culture/gram stain. After culture samples obtained will want to consider abx covering for pseudomonas and MRSA with caution re ESRD. -f/u CXR -f/u blood cultures to r/o bacteremia -f/u sputum gram stain and culture if possible -f/u BMP and CBC -need to better visualize oropharynx re possible exudate/erythema -if tolerated, gentle flushing of R ear with warm saline -continue acetaminophen and hydrocodone for pain  2. Back pain: broad differential including secondary hyperparathyroidism vs. fracture vs. strain myeloma/malignancy -f/u chem panel and PTH for now   3. ESRD on dialysis, w/associated anemia- will f/u BMP/CBC and if pt still in hospital will dialyze Wednesday.   4. 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   5. Secondary hyperparathyroidism - f/u BMP  6. CAD and diastolic chronic CHF - appears hemodynamically stable, continue home regimens  7. Peripheral artery disease - extremities appear well-perfused now   8. Chronic constipation - episode of recent loose BM, if continued loose BM consider holding home constipation meds   9. GERD - is on pantoprazole  10. Depression - cont home regimen  11.  Tardive dyskinesia - nothing to do    Signed: Alvino Chapel 01/17/2011, 4:39 PM

## 2011-01-17 NOTE — Plan of Care (Signed)
Problem: Phase I Progression Outcomes Goal: Voiding-avoid urinary catheter unless indicated Outcome: Not Applicable Date Met:  01/17/11 Anuric

## 2011-01-18 ENCOUNTER — Inpatient Hospital Stay (HOSPITAL_COMMUNITY): Payer: Medicare (Managed Care)

## 2011-01-18 ENCOUNTER — Other Ambulatory Visit: Payer: Self-pay

## 2011-01-18 DIAGNOSIS — I509 Heart failure, unspecified: Secondary | ICD-10-CM

## 2011-01-18 DIAGNOSIS — I5032 Chronic diastolic (congestive) heart failure: Secondary | ICD-10-CM

## 2011-01-18 LAB — GLUCOSE, CAPILLARY
Glucose-Capillary: 189 mg/dL — ABNORMAL HIGH (ref 70–99)
Glucose-Capillary: 60 mg/dL — ABNORMAL LOW (ref 70–99)
Glucose-Capillary: 156 mg/dL — ABNORMAL HIGH (ref 70–99)
Glucose-Capillary: 48 mg/dL — ABNORMAL LOW (ref 70–99)

## 2011-01-18 LAB — BASIC METABOLIC PANEL
CO2: 31 mEq/L (ref 19–32)
Calcium: 9.9 mg/dL (ref 8.4–10.5)
Creatinine, Ser: 6.09 mg/dL — ABNORMAL HIGH (ref 0.50–1.10)
GFR calc non Af Amer: 7 mL/min — ABNORMAL LOW (ref >90)
Glucose, Bld: 63 mg/dL — ABNORMAL LOW (ref 70–99)
Potassium: 3.9 mEq/L (ref 3.5–5.1)
Sodium: 137 mEq/L (ref 135–145)

## 2011-01-18 LAB — CBC
MCH: 26.1 pg (ref 26.0–34.0)
MCV: 84.5 fL (ref 78.0–100.0)
Platelets: 247 10*3/uL (ref 150–400)
RBC: 4.33 MIL/uL (ref 3.87–5.11)
RDW: 17.4 % — ABNORMAL HIGH (ref 11.5–15.5)
WBC: 6.6 10*3/uL (ref 4.0–10.5)

## 2011-01-18 MED ORDER — LIDOCAINE HCL (PF) 1 % IJ SOLN
5.0000 mL | INTRAMUSCULAR | Status: DC | PRN
  Filled 2011-01-18: qty 5

## 2011-01-18 MED ORDER — PENTAFLUOROPROP-TETRAFLUOROETH EX AERO
1.0000 "application " | INHALATION_SPRAY | CUTANEOUS | Status: DC | PRN
  Administered 2011-01-19: 1 via TOPICAL
  Filled 2011-01-18: qty 103.5

## 2011-01-18 MED ORDER — VANCOMYCIN HCL IN DEXTROSE 1-5 GM/200ML-% IV SOLN
1000.0000 mg | INTRAVENOUS | Status: DC
  Administered 2011-01-19 – 2011-01-21 (×3): 1000 mg via INTRAVENOUS
  Filled 2011-01-18 (×4): qty 200

## 2011-01-18 MED ORDER — ALTEPLASE 2 MG IJ SOLR: 2.0000 mg | Freq: Once | INTRAMUSCULAR | Status: AC | PRN

## 2011-01-18 MED ORDER — LIDOCAINE-PRILOCAINE 2.5-2.5 % EX CREA
1.0000 "application " | TOPICAL_CREAM | CUTANEOUS | Status: DC | PRN
  Filled 2011-01-18: qty 5

## 2011-01-18 MED ORDER — DOCUSATE SODIUM 283 MG RE ENEM
1.0000 | ENEMA | RECTAL | Status: DC | PRN
  Filled 2011-01-18 (×2): qty 0.2

## 2011-01-18 MED ORDER — FLEET ENEMA 7-19 GM/118ML RE ENEM
1.0000 | ENEMA | Freq: Once | RECTAL | Status: AC
  Administered 2011-01-18: 1 via RECTAL
  Filled 2011-01-18: qty 1

## 2011-01-18 MED ORDER — CALCIUM CARBONATE 1250 MG/5ML PO SUSP: 500.0000 mg | Freq: Four times a day (QID) | ORAL | Status: DC | PRN

## 2011-01-18 MED ORDER — PARICALCITOL 5 MCG/ML IV SOLN
10.0000 ug | INTRAVENOUS | Status: DC
  Administered 2011-01-19 – 2011-01-21 (×3): 10 ug via INTRAVENOUS
  Filled 2011-01-18 (×2): qty 2

## 2011-01-18 MED ORDER — SODIUM CHLORIDE 0.9 % IV SOLN: 100.0000 mL | INTRAVENOUS | Status: DC | PRN

## 2011-01-18 MED ORDER — ACETAMINOPHEN 650 MG RE SUPP: 650.0000 mg | Freq: Four times a day (QID) | RECTAL | Status: DC | PRN

## 2011-01-18 MED ORDER — SORBITOL 70 % SOLN: 30.0000 mL | Status: DC | PRN

## 2011-01-18 MED ORDER — DEXTROSE 5 % IV SOLN
500.0000 mg | INTRAVENOUS | Status: DC
  Administered 2011-01-18 – 2011-01-20 (×3): 500 mg via INTRAVENOUS
  Filled 2011-01-18 (×4): qty 0.5

## 2011-01-18 MED ORDER — PENTAFLUOROPROP-TETRAFLUOROETH EX AERO
INHALATION_SPRAY | CUTANEOUS | Status: AC
  Filled 2011-01-18: qty 103.5

## 2011-01-18 MED ORDER — NEPRO/CARBSTEADY PO LIQD: 237.0000 mL | Freq: Three times a day (TID) | ORAL | Status: DC | PRN

## 2011-01-18 MED ORDER — HEPARIN SODIUM (PORCINE) 1000 UNIT/ML DIALYSIS
1000.0000 [IU] | INTRAMUSCULAR | Status: DC | PRN
  Filled 2011-01-18: qty 1

## 2011-01-18 MED ORDER — HEPARIN SODIUM (PORCINE) 1000 UNIT/ML DIALYSIS
100.0000 [IU]/kg | INTRAMUSCULAR | Status: DC | PRN
  Administered 2011-01-18 – 2011-01-19 (×2): 7900 [IU] via INTRAVENOUS_CENTRAL
  Filled 2011-01-18: qty 8

## 2011-01-18 MED ORDER — AMLODIPINE BESYLATE 5 MG PO TABS
5.0000 mg | ORAL_TABLET | Freq: Every day | ORAL | Status: DC
  Administered 2011-01-20: 5 mg via ORAL
  Filled 2011-01-18 (×3): qty 1

## 2011-01-18 MED ORDER — INSULIN GLARGINE 100 UNIT/ML ~~LOC~~ SOLN
7.0000 [IU] | Freq: Every day | SUBCUTANEOUS | Status: DC
  Administered 2011-01-19: 7 [IU] via SUBCUTANEOUS

## 2011-01-18 NOTE — Consult Note (Signed)
Reason for Consult: CP, positive Trop Referring Physician:   [name] is an 55 y.o. female.  HPI: The patient is a 55 yo AA female with a history of CAD with CABG in 2002(left internal mammary to the left anterior descending coronary artery, reversed saphenous vein graft to the distal right coronary artery, reversed saphenous vein graft to the obtuse marginal coronary artery), grade 2 diastolic dysfunction, EF of 60-65% by echo 11/17/09, ESRD, PAD, DM2, CVA, HLD, secondary hyperparathyroidism.   Dr. Clarene [name] cathed her in Jan 2011 which showed patent grafts and an 80% long, tubular AV groove Circ lesion not amendable to intervention.She is currently being treated for respiratory infection.  BNP 31,000+. The patient states that she was awaken from sleep early this AM, while it was still dark, with 8/10 chest pressure which last a couple of minutes.  EKG shows ST depression in V2,3.  She reports occasional palpitations and SOB.  She has two pillow orthopnea.  She denies radiation of pain to neck, arm, jaw.  She denies LEE, PND.    Past Medical History  Diagnosis Date  . ESRD (end stage renal disease) on dialysis   . Hyperparathyroidism, secondary renal   . CAD (coronary artery disease) of artery bypass graft     CABG in 2002  . PAD (peripheral artery disease)     Not a candidate for revascularization  . Chronic constipation   . GERD (gastroesophageal reflux disease)   . Anemia of chronic renal failure   . Tardive dyskinesia     Secondary to Reglan  . Depression   . Neuropathy   . Diabetic retinopathy   . Diastolic CHF, chronic     Grade 2  . Pulmonary embolism 06/2003  . Complication of anesthesia     "sleepy afterwards; due to her being dialysis pt"  . Hyperlipidemia   . Myocardial infarction 2002  . Stroke 2002    "w/left side" residual weakness  . Dialysis patient 01/17/11    Faith [name]; M, W, F; via right upper arm AVF  . Diabetes mellitus     type II  . CHF (congestive  heart failure)   . CVA (cerebral infarction)     right brain with minimal left hemiparesis  . Pneumonia 10/2009; 11/2009    ?pneumonia 01/17/11    Past Surgical History  Procedure Date  . Coronary artery bypass graft 2002  . Laparoscopic cholecystectomy 2002  . Av fistula placement     times 4  . Trigger finger release   . Tubal ligation   . Eye examination under anesthesia w/ retinal cryotherapy and retinal laser   . Coronary angioplasty with stent placement   . Breast biopsy 06/2010  . Cataract extraction 07/2010    right sided   . Toe amputation     left 2nd toe secondary to osteomyelitis    Family History  Problem Relation Age of Onset  . Diabetes Mother   . Cirrhosis Mother   . Diabetes Father   . Heart attack Father   . Diabetes Sister   . Diabetes Sister   . Diabetes Sister     Social History:  reports that she has never smoked. She has never used smokeless tobacco. She reports that she uses illicit drugs (Marijuana). She reports that she does not drink alcohol.  Allergies:  Allergies  Allergen Reactions  . Codeine Other (See Comments)    unknown  . Lipitor (Atorvastatin Calcium) Other (See Comments)    unknown  .  Reglan Other (See Comments)    unknown    Medications: Medications Prior to Admission  Medication Dose Route Frequency Provider Last Rate Last Dose  . 0.9 %  sodium chloride infusion  100 mL Intravenous PRN Lizbeth Bark, FNP      . acetaminophen (TYLENOL) suppository 650 mg  650 mg Rectal Q6H PRN Lizbeth Bark, FNP      . acetaminophen (TYLENOL) tablet 650 mg  650 mg Oral Q6H PRN Christopher Pribula   650 mg at 01/17/11 1843  . albuterol (PROVENTIL) (5 MG/ML) 0.5% nebulizer solution 2.5 mg  2.5 mg Nebulization Q4H PRN Leodis Sias       And  . ipratropium (ATROVENT) nebulizer solution 0.5 mg  0.5 mg Nebulization Q4H PRN Leodis Sias      . alteplase (CATHFLO ACTIVASE) injection 2 mg  2 mg Intracatheter Once PRN Lizbeth Bark, FNP      .  amLODipine (NORVASC) tablet 5 mg  5 mg Oral QHS Lizbeth Bark, FNP      . aspirin chewable tablet 81 mg  81 mg Oral Daily Christopher Pribula   81 mg at 01/18/11 1007  . benzonatate (TESSALON) capsule 200 mg  200 mg Oral TID PRN Christopher Pribula   200 mg at 01/17/11 2338  . calcium carbonate (dosed in mg elemental calcium) suspension 500 mg of elemental calcium  500 mg of elemental calcium Oral Q6H PRN Lizbeth Bark, FNP      . ceFEPIme (MAXIPIME) 1 g in dextrose 5 % 50 mL IVPB  1 g Intravenous Q24H Laurena Bering, PHARMD   1 g at 01/17/11 1833  . cinacalcet (SENSIPAR) tablet 90 mg  90 mg Oral Q breakfast Christopher Pribula   90 mg at 01/18/11 0950  . docusate sodium (ENEMEEZ) enema 283 mg  1 enema Rectal PRN Lizbeth Bark, FNP      . feeding supplement (NEPRO CARB STEADY) liquid 237 mL  237 mL Oral TID PRN Lizbeth Bark, FNP      . fenofibrate tablet 160 mg  160 mg Oral Daily Christopher Pribula   160 mg at 01/18/11 0949  . gabapentin (NEURONTIN) capsule 400 mg  400 mg Oral Q0700 Christopher Pribula   400 mg at 01/18/11 0949  . glimepiride (AMARYL) tablet 4 mg  4 mg Oral Q0700 Christopher Pribula   4 mg at 01/18/11 0950  . heparin injection 1,000 Units  1,000 Units Dialysis PRN Lizbeth Bark, FNP      . heparin injection 5,000 Units  5,000 Units Subcutaneous Q8H Christopher Pribula   5,000 Units at 01/18/11 1340  . heparin injection 7,900 Units  100 Units/kg Dialysis PRN Lizbeth Bark, FNP      . HYDROcodone-acetaminophen (NORCO) 5-325 MG per tablet 1-2 tablet  1-2 tablet Oral Q4H PRN Leodis Sias   2 tablet at 01/17/11 2339  . insulin aspart (novoLOG) injection 0-9 Units  0-9 Units Subcutaneous TID WC Christopher Pribula   2 Units at 01/18/11 1339  . insulin glargine (LANTUS) injection 7 Units  7 Units Subcutaneous QHS Kathreen Cosier, MD      . lidocaine (XYLOCAINE) 1 % injection 5 mL  5 mL Intradermal PRN Lizbeth Bark, FNP      . lidocaine-prilocaine (EMLA) cream 1 application  1  application Topical PRN Lizbeth Bark, FNP      . multivitamin (RENA-VIT) tablet 1 tablet  1 tablet Oral Daily Christopher Pribula   1 tablet at  01/18/11 0949  . pantoprazole (PROTONIX) EC tablet 40 mg  40 mg Oral Q1200 Christopher Pribula   40 mg at 01/18/11 1340  . paricalcitol (ZEMPLAR) injection 10 mcg  10 mcg Intravenous Q M,W,F-HD Lizbeth Bark, FNP      . pentafluoroprop-tetrafluoroeth (GEBAUERS) aerosol 1 application  1 application Topical PRN Lizbeth Bark, FNP      . phenol (CHLORASEPTIC) mouth spray 1 spray  1 spray Mouth/Throat PRN Christopher Pribula   1 spray at 01/18/11 0648  . polyethylene glycol (MIRALAX / GLYCOLAX) packet 17 g  17 g Oral Daily Christopher Pribula   17 g at 01/18/11 0949  . polyvinyl alcohol (LIQUIFILM TEARS) 1.4 % ophthalmic solution 1 drop  1 drop Both Eyes Daily PRN Leodis Sias      . pregabalin (LYRICA) capsule 50 mg  50 mg Oral QPM Christopher Pribula   50 mg at 01/17/11 2121  . promethazine (PHENERGAN) tablet 25 mg  25 mg Oral Q6H PRN Christopher Pribula   25 mg at 01/18/11 1339  . sevelamer (RENAGEL) tablet 3,200 mg  3,200 mg Oral TID WC Christopher Pribula   3,200 mg at 01/18/11 0957  . sevelamer (RENVELA) tablet 3,200 mg  3,200 mg Oral TID WC Elwin Sleight, PHARMD   3,200 mg at 01/18/11 0949  . sodium chloride 0.9 % injection 3 mL  3 mL Intravenous Q12H Christopher Pribula   3 mL at 01/18/11 1005  . sodium chloride 0.9 % injection 3 mL  3 mL Intravenous PRN Leodis Sias      . sodium phosphate (FLEET) 7-19 GM/118ML enema 1 enema  1 enema Rectal Once Kathreen Cosier, MD   1 enema at 01/18/11 1338  . sorbitol 70 % solution 30 mL  30 mL Oral PRN Lizbeth Bark, FNP      . vancomycin (VANCOCIN) 1,500 mg in sodium chloride 0.9 % 250 mL IVPB  1,500 mg Intravenous Once Laurena Bering, PHARMD   1,500 mg at 01/17/11 2119  . DISCONTD: 0.9 %  sodium chloride infusion  250 mL Intravenous Continuous Leodis Sias      . DISCONTD: amLODipine  (NORVASC) tablet 5 mg  5 mg Oral Daily Christopher Pribula   5 mg at 01/18/11 0948  . DISCONTD: ceFEPIme (MAXIPIME) 1 g in dextrose 5 % 50 mL IVPB  1 g Intravenous Q8H Christopher Pribula      . DISCONTD: insulin glargine (LANTUS) injection 10 Units  10 Units Subcutaneous QHS Christopher Pribula   10 Units at 01/17/11 2141  . DISCONTD: Levofloxacin (LEVAQUIN) IVPB 750 mg  750 mg Intravenous Q24H Leodis Sias      . DISCONTD: vancomycin (VANCOCIN) 1,500 mg in sodium chloride 0.9 % 250 mL IVPB  1,500 mg Intravenous Q hemodialysis Laurena Bering, PHARMD      . DISCONTD: vancomycin (VANCOCIN) 1,500 mg in sodium chloride 0.9 % 500 mL IVPB  1,500 mg Intravenous Once Laurena Bering, PHARMD       No current outpatient prescriptions on file as of 01/18/2011.     Results for orders placed during the hospital encounter of 01/17/11 (from the past 48 hour(s))  MRSA PCR SCREENING     Status: Normal   Collection Time   01/17/11  2:39 PM      Component Value Range Comment   MRSA by PCR NEGATIVE  NEGATIVE    GLUCOSE, CAPILLARY     Status: Abnormal   Collection Time   01/17/11  3:47 PM  Component Value Range Comment   Glucose-Capillary 179 (*) 70 - 99 (mg/dL)    Comment 1 Documented in Chart      Comment 2 Notify RN     GLUCOSE, CAPILLARY     Status: Abnormal   Collection Time   01/17/11  4:40 PM      Component Value Range Comment   Glucose-Capillary 153 (*) 70 - 99 (mg/dL)    Comment 1 Documented in Chart      Comment 2 Notify RN     COMPREHENSIVE METABOLIC PANEL     Status: Abnormal   Collection Time   01/17/11  4:55 PM      Component Value Range Comment   Sodium 137  135 - 145 (mEq/L)    Potassium 4.0  3.5 - 5.1 (mEq/L)    Chloride 93 (*) 96 - 112 (mEq/L)    CO2 30  19 - 32 (mEq/L)    Glucose, Bld 155 (*) 70 - 99 (mg/dL)    BUN 22  6 - 23 (mg/dL)    Creatinine, Ser 1.61 (*) 0.50 - 1.10 (mg/dL)    Calcium 09.6  8.4 - 10.5 (mg/dL)    Total Protein 8.7 (*) 6.0 - 8.3 (g/dL)      Albumin 3.5  3.5 - 5.2 (g/dL)    AST 40 (*) 0 - 37 (U/L)    ALT 35  0 - 35 (U/L)    Alkaline Phosphatase 90  39 - 117 (U/L)    Total Bilirubin 0.3  0.3 - 1.2 (mg/dL)    GFR calc non Af Amer 9 (*) >90 (mL/min)    GFR calc Af Amer 10 (*) >90 (mL/min)   PHOSPHORUS     Status: Normal   Collection Time   01/17/11  4:55 PM      Component Value Range Comment   Phosphorus 3.9  2.3 - 4.6 (mg/dL)   MAGNESIUM     Status: Normal   Collection Time   01/17/11  4:55 PM      Component Value Range Comment   Magnesium 2.0  1.5 - 2.5 (mg/dL)   PRO B NATRIURETIC PEPTIDE     Status: Abnormal   Collection Time   01/17/11  4:55 PM      Component Value Range Comment   BNP, POC 31334.0 (*) 0 - 125 (pg/mL)   CBC     Status: Abnormal   Collection Time   01/17/11  4:55 PM      Component Value Range Comment   WBC 8.8  4.0 - 10.5 (K/uL)    RBC 4.55  3.87 - 5.11 (MIL/uL)    Hemoglobin 12.4  12.0 - 15.0 (g/dL)    HCT 04.5  40.9 - 81.1 (%)    MCV 82.6  78.0 - 100.0 (fL)    MCH 27.3  26.0 - 34.0 (pg)    MCHC 33.0  30.0 - 36.0 (g/dL)    RDW 91.4 (*) 78.2 - 15.5 (%)    Platelets 250  150 - 400 (K/uL)   DIFFERENTIAL     Status: Normal   Collection Time   01/17/11  4:55 PM      Component Value Range Comment   Neutrophils Relative 69  43 - 77 (%)    Neutro Abs 6.1  1.7 - 7.7 (K/uL)    Lymphocytes Relative 19  12 - 46 (%)    Lymphs Abs 1.7  0.7 - 4.0 (K/uL)    Monocytes Relative 8  3 -  12 (%)    Monocytes Absolute 0.7  0.1 - 1.0 (K/uL)    Eosinophils Relative 4  0 - 5 (%)    Eosinophils Absolute 0.3  0.0 - 0.7 (K/uL)    Basophils Relative 0  0 - 1 (%)    Basophils Absolute 0.0  0.0 - 0.1 (K/uL)   HEMOGLOBIN A1C     Status: Abnormal   Collection Time   01/17/11  4:55 PM      Component Value Range Comment   Hemoglobin A1C 10.6 (*) <5.7 (%)    Mean Plasma Glucose 258 (*) <117 (mg/dL)   GLUCOSE, CAPILLARY     Status: Abnormal   Collection Time   01/17/11  9:17 PM      Component Value Range Comment    Glucose-Capillary 188 (*) 70 - 99 (mg/dL)   BASIC METABOLIC PANEL     Status: Abnormal   Collection Time   01/18/11  5:40 AM      Component Value Range Comment   Sodium 137  135 - 145 (mEq/L)    Potassium 3.9  3.5 - 5.1 (mEq/L)    Chloride 94 (*) 96 - 112 (mEq/L)    CO2 31  19 - 32 (mEq/L)    Glucose, Bld 63 (*) 70 - 99 (mg/dL)    BUN 29 (*) 6 - 23 (mg/dL)    Creatinine, Ser 1.61 (*) 0.50 - 1.10 (mg/dL)    Calcium 9.9  8.4 - 10.5 (mg/dL)    GFR calc non Af Amer 7 (*) >90 (mL/min)    GFR calc Af Amer 8 (*) >90 (mL/min)   CBC     Status: Abnormal   Collection Time   01/18/11  5:40 AM      Component Value Range Comment   WBC 6.6  4.0 - 10.5 (K/uL)    RBC 4.33  3.87 - 5.11 (MIL/uL)    Hemoglobin 11.3 (*) 12.0 - 15.0 (g/dL)    HCT 09.6  04.5 - 40.9 (%)    MCV 84.5  78.0 - 100.0 (fL)    MCH 26.1  26.0 - 34.0 (pg)    MCHC 30.9  30.0 - 36.0 (g/dL)    RDW 81.1 (*) 91.4 - 15.5 (%)    Platelets 247  150 - 400 (K/uL)   GLUCOSE, CAPILLARY     Status: Abnormal   Collection Time   01/18/11  7:49 AM      Component Value Range Comment   Glucose-Capillary 54 (*) 70 - 99 (mg/dL)    Comment 1 Notify RN      Comment 2 Documented in Chart     GLUCOSE, CAPILLARY     Status: Abnormal   Collection Time   01/18/11  8:32 AM      Component Value Range Comment   Glucose-Capillary 60 (*) 70 - 99 (mg/dL)    Comment 1 Notify RN      Comment 2 Documented in Chart     GLUCOSE, CAPILLARY     Status: Abnormal   Collection Time   01/18/11  9:29 AM      Component Value Range Comment   Glucose-Capillary 189 (*) 70 - 99 (mg/dL)    Comment 1 Notify RN      Comment 2 Documented in Chart     GLUCOSE, CAPILLARY     Status: Abnormal   Collection Time   01/18/11 11:43 AM      Component Value Range Comment   Glucose-Capillary 156 (*) 70 -  99 (mg/dL)    Comment 1 Notify RN      Comment 2 Documented in Chart     CARDIAC PANEL(CRET KIN+CKTOT+MB+TROPI)     Status: Abnormal   Collection Time   01/18/11  11:46 AM      Component Value Range Comment   Total CK 134  7 - 177 (U/L)    CK, MB 2.9  0.3 - 4.0 (ng/mL)    Troponin I 0.74 (*) <0.30 (ng/mL)    Relative Index 2.2  0.0 - 2.5      X-ray Chest Pa And Lateral  01/18/2011  *RADIOLOGY REPORT*  Clinical Data: Fever and cough  CHEST - 2 VIEW  Comparison: 10/05/2010  Findings: Low lung volumes.  Central vascular congestion and edema. No pleural effusion.  No pneumothorax.  Moderate cardiomegaly.  IMPRESSION: Mild CHF with central vascular congestion and edema.  Original Report Authenticated By: Donavan Burnet, M.D.    Review of Systems  Constitutional: Negative for diaphoresis.  HENT: Positive for sore throat.   Respiratory: Positive for cough.   Cardiovascular: Positive for chest pain, palpitations and orthopnea. Negative for leg swelling and PND.       CP but resolved.  Gastrointestinal: Negative for nausea and vomiting.   Blood pressure 126/61, pulse 72, temperature 98.4 F (36.9 C), temperature source Oral, resp. rate 20, weight 78.9 kg (173 lb 15.1 oz), SpO2 96.00%. Physical Exam  Constitutional: She is oriented to person, place, and time.  HENT:  Head: Normocephalic and atraumatic.  Eyes: EOM are normal. No scleral icterus.  Neck: No JVD present.  Cardiovascular: Normal rate and regular rhythm.   No murmur heard. Respiratory: She has wheezes.  GI: Bowel sounds are normal.  Lymphadenopathy:    She has no cervical adenopathy.  Neurological: She is alert and oriented to person, place, and time.    Assessment/Plan: Patient Active Hospital Problem List:  Chest Pain- Troponin 0.74  CAD (coronary artery disease) of artery bypass graft (01/17/2011)  Diastolic CHF, chronic (01/17/2011)  ESRD (end stage renal disease) on dialysis (01/17/2011)   Respiratory infection,   Diabetes mellitus type II (01/17/2011)  Secondary hyperparathyroidism (01/17/2011)  Chronic constipation (01/17/2011)  GERD (gastroesophageal reflux disease)  (01/17/2011)  Anemia of chronic renal failure (01/17/2011)  Depression (01/17/2011)  Neuropathy (01/17/2011)      Plan:  Continue to cycle cardiac enzymes. Troponin positive in the setting of diastolic CHF and ESRD.  Chest pain was significant enough to awaken the patient from sleep and there were new EKG  Changes.   May need heart cath.  80% circumflex lesion in Jan 2011 by cath.  Only on ASA and Norvasc 5mg .  Has had problems with hypotension.  Office records show the pt is taking Plavix 75mg   daily but not PTA record.  AM EKG ordered.  Regena Delucchi W 01/18/2011, 3:21 PM

## 2011-01-18 NOTE — H&P (Signed)
Internal Medicine Teaching Service Attending Note Date: 01/18/2011  Patient name: Faith [name]  Medical record number: [id number]  Date of birth: [birth year]   I have seen and evaluated Faith [name] and discussed their care with the Residency Team.  I agree with their HPI with the following addition. Bc of symptomatic hypotension after HD, her dry weight weight was increased last week.   Physical Exam: Blood pressure 117/69, pulse 71, temperature 98.5 F (36.9 C), temperature source Oral, resp. rate 20, SpO2 95.00%. She is lying in bed in NAD. She coughs freq and is hoarse.  Lungs good air movement with bibasilar crackles. HRRR no MRG ABD : + BS, S/NT/ND Neuro : tardive dyskinesia. Moves all four ext. No focal.   Lab results: Results for orders placed during the hospital encounter of 01/17/11 (from the past 24 hour(s))  MRSA PCR SCREENING     Status: Normal   Collection Time   01/17/11  2:39 PM      Component Value Range   MRSA by PCR NEGATIVE  NEGATIVE   GLUCOSE, CAPILLARY     Status: Abnormal   Collection Time   01/17/11  3:47 PM      Component Value Range   Glucose-Capillary 179 (*) 70 - 99 (mg/dL)   Comment 1 Documented in Chart     Comment 2 Notify RN    GLUCOSE, CAPILLARY     Status: Abnormal   Collection Time   01/17/11  4:40 PM      Component Value Range   Glucose-Capillary 153 (*) 70 - 99 (mg/dL)   Comment 1 Documented in Chart     Comment 2 Notify RN    COMPREHENSIVE METABOLIC PANEL     Status: Abnormal   Collection Time   01/17/11  4:55 PM      Component Value Range   Sodium 137  135 - 145 (mEq/L)   Potassium 4.0  3.5 - 5.1 (mEq/L)   Chloride 93 (*) 96 - 112 (mEq/L)   CO2 30  19 - 32 (mEq/L)   Glucose, Bld 155 (*) 70 - 99 (mg/dL)   BUN 22  6 - 23 (mg/dL)   Creatinine, Ser 9.14 (*) 0.50 - 1.10 (mg/dL)   Calcium 78.2  8.4 - 10.5 (mg/dL)   Total Protein 8.7 (*) 6.0 - 8.3 (g/dL)   Albumin 3.5  3.5 - 5.2 (g/dL)   AST 40 (*) 0 - 37 (U/L)   ALT 35  0 -  35 (U/L)   Alkaline Phosphatase 90  39 - 117 (U/L)   Total Bilirubin 0.3  0.3 - 1.2 (mg/dL)   GFR calc non Af Amer 9 (*) >90 (mL/min)   GFR calc Af Amer 10 (*) >90 (mL/min)  PHOSPHORUS     Status: Normal   Collection Time   01/17/11  4:55 PM      Component Value Range   Phosphorus 3.9  2.3 - 4.6 (mg/dL)  MAGNESIUM     Status: Normal   Collection Time   01/17/11  4:55 PM      Component Value Range   Magnesium 2.0  1.5 - 2.5 (mg/dL)  PRO B NATRIURETIC PEPTIDE     Status: Abnormal   Collection Time   01/17/11  4:55 PM      Component Value Range   BNP, POC 31334.0 (*) 0 - 125 (pg/mL)  CBC     Status: Abnormal   Collection Time   01/17/11  4:55 PM  Component Value Range   WBC 8.8  4.0 - 10.5 (K/uL)   RBC 4.55  3.87 - 5.11 (MIL/uL)   Hemoglobin 12.4  12.0 - 15.0 (g/dL)   HCT 16.1  09.6 - 04.5 (%)   MCV 82.6  78.0 - 100.0 (fL)   MCH 27.3  26.0 - 34.0 (pg)   MCHC 33.0  30.0 - 36.0 (g/dL)   RDW 40.9 (*) 81.1 - 15.5 (%)   Platelets 250  150 - 400 (K/uL)  DIFFERENTIAL     Status: Normal   Collection Time   01/17/11  4:55 PM      Component Value Range   Neutrophils Relative 69  43 - 77 (%)   Neutro Abs 6.1  1.7 - 7.7 (K/uL)   Lymphocytes Relative 19  12 - 46 (%)   Lymphs Abs 1.7  0.7 - 4.0 (K/uL)   Monocytes Relative 8  3 - 12 (%)   Monocytes Absolute 0.7  0.1 - 1.0 (K/uL)   Eosinophils Relative 4  0 - 5 (%)   Eosinophils Absolute 0.3  0.0 - 0.7 (K/uL)   Basophils Relative 0  0 - 1 (%)   Basophils Absolute 0.0  0.0 - 0.1 (K/uL)  HEMOGLOBIN A1C     Status: Abnormal   Collection Time   01/17/11  4:55 PM      Component Value Range   Hemoglobin A1C 10.6 (*) <5.7 (%)   Mean Plasma Glucose 258 (*) <117 (mg/dL)  GLUCOSE, CAPILLARY     Status: Abnormal   Collection Time   01/17/11  9:17 PM      Component Value Range   Glucose-Capillary 188 (*) 70 - 99 (mg/dL)  BASIC METABOLIC PANEL     Status: Abnormal   Collection Time   01/18/11  5:40 AM      Component Value Range    Sodium 137  135 - 145 (mEq/L)   Potassium 3.9  3.5 - 5.1 (mEq/L)   Chloride 94 (*) 96 - 112 (mEq/L)   CO2 31  19 - 32 (mEq/L)   Glucose, Bld 63 (*) 70 - 99 (mg/dL)   BUN 29 (*) 6 - 23 (mg/dL)   Creatinine, Ser 9.14 (*) 0.50 - 1.10 (mg/dL)   Calcium 9.9  8.4 - 78.2 (mg/dL)   GFR calc non Af Amer 7 (*) >90 (mL/min)   GFR calc Af Amer 8 (*) >90 (mL/min)  CBC     Status: Abnormal   Collection Time   01/18/11  5:40 AM      Component Value Range   WBC 6.6  4.0 - 10.5 (K/uL)   RBC 4.33  3.87 - 5.11 (MIL/uL)   Hemoglobin 11.3 (*) 12.0 - 15.0 (g/dL)   HCT 95.6  21.3 - 08.6 (%)   MCV 84.5  78.0 - 100.0 (fL)   MCH 26.1  26.0 - 34.0 (pg)   MCHC 30.9  30.0 - 36.0 (g/dL)   RDW 57.8 (*) 46.9 - 15.5 (%)   Platelets 247  150 - 400 (K/uL)  GLUCOSE, CAPILLARY     Status: Abnormal   Collection Time   01/18/11  7:49 AM      Component Value Range   Glucose-Capillary 54 (*) 70 - 99 (mg/dL)   Comment 1 Notify RN     Comment 2 Documented in Chart    GLUCOSE, CAPILLARY     Status: Abnormal   Collection Time   01/18/11  8:32 AM      Component Value  Range   Glucose-Capillary 60 (*) 70 - 99 (mg/dL)   Comment 1 Notify RN     Comment 2 Documented in Chart    GLUCOSE, CAPILLARY     Status: Abnormal   Collection Time   01/18/11  9:29 AM      Component Value Range   Glucose-Capillary 189 (*) 70 - 99 (mg/dL)   Comment 1 Notify RN     Comment 2 Documented in Chart      Imaging results:  X-ray Chest Pa And Lateral  01/18/2011  *RADIOLOGY REPORT*  Clinical Data: Fever and cough  CHEST - 2 VIEW  Comparison: 10/05/2010  Findings: Low lung volumes.  Central vascular congestion and edema. No pleural effusion.  No pneumothorax.  Moderate cardiomegaly.  IMPRESSION: Mild CHF with central vascular congestion and edema.  Original Report Authenticated By: Donavan Burnet, M.D.    Assessment and Plan: I agree with the formulated Assessment and Plan with the following changes:  1. Pulmonary edema - pt's CXR  clearly shows new interstitial edema and her recent increase in dry weight would support volume overload. However, she is clinically stable. Pt is anuric. Renal has been consulted and will be able to determine her HD settings for tomorrow. ECHO does not need to be repeated. Last checked in 9/11. We have a reason for volume overload (dry weight change). If EKG or CE are abnl, then would need new ECHO.   2. Presumed HAP - She was found to has signs and sxs c/w PNA by her PCP. Her ABX have been broadened. However, she does not look toxic nor septic. Will continue Vanc and cefepime but not the FQ. If she makes an impressive improvement after HD, then we could consider narrowing abx. Additionally, pt has some sxs that would point towards a viral cause (ear pain, hoarseness).   3. CP  - could be secondary to pul edema or HAP / CAP or 2/2 coughing. But with pul edema and known CAD, she needs CE and EKG to r/o ischemia.   4. Constipation - stool softner didn't help. Requests enema.   Not stable for D/C. Home in a few days after symptomatic improvement and volume stabilization.

## 2011-01-18 NOTE — Progress Notes (Signed)
ANTIBIOTIC CONSULT NOTE - FOLLOW UP  Pharmacy Consult for   Vancomycin & Cefepime Indication: rule out pneumonia  Allergies  Allergen Reactions  . Codeine Other (See Comments)    unknown  . Lipitor (Atorvastatin Calcium) Other (See Comments)    unknown  . Reglan Other (See Comments)    unknown    Patient Measurements: Weight: 173 lb 15.1 oz (78.9 kg) (per recent admission )  Vital Signs: Temp: 98.4 F (36.9 C) (11/27 1400) Temp src: Oral (11/27 1400) BP: 126/61 mmHg (11/27 1400) Pulse Rate: 72  (11/27 1400)  Labs:  Basename 01/18/11 0540 01/17/11 1655  WBC 6.6 8.8  HGB 11.3* 12.4  PLT 247 250  LABCREA -- --  CREATININE 6.09* 4.97*    Microbiology: Recent Results (from the past 720 hour(s))  MRSA PCR SCREENING     Status: Normal   Collection Time   01/17/11  2:39 PM      Component Value Range Status Comment   MRSA by PCR NEGATIVE  NEGATIVE  Final     Assessment:    Vancomycin 1500 mg IV given last night ~9pm    Cefepime begun with 1 gram IV last night ~6:30pm.    Planning extra HD today.    Expect Vancomycin levels to remain adequate today.      Goal of Therapy:     Pre-HD vancomycin levels 15-25 mcg/ml    Appropriate Cefepime dose for renal function & infection.  Plan:     Will plan to give Vancomycin 1 gram IV after HD 11/28.    Will decrease Cefepime to 500 mg IV q24hrs.  Dennie Fetters Pager:  [phone] 01/18/2011,4:42 PM

## 2011-01-18 NOTE — Consults (Addendum)
[facility] Renal Consultation  Reason for Consult   Management of ESRD/hemodialysis; anemia, hypertension/volume and secondary hyperparathyroidism  Referring Physician: Irine Seal (teaching service)   Faith [name] is an 54 y.o. female.  HPI:55 year old AA female with a PMH of ESRD, CAD, diastolic CHF, PVD, and poorly controlled DM who presented to the ED with a 3 wk history of cough, rhinorrhea, and sore throat. She was seen on last Fri by her primary with [facility] program and was given a shot of ceftriaxone and a prescription for moxifloxacin for possible RLL pneumonia as a CXR was not done per patient report. Pulmonary edema noted adm CXR and admitted for further treatment and evaluation. Renal Consult onging RRT. Due to resp status, will plan an extra HD today for volume overload.   Dialyzes at [facility]. on MWF since 01/03/2000. Primary Nephrologist Dr. Fayrene Fearing Deterding. EDW 78.5kg. HD Bath 2K/2.25Ca, Dialyzer 180, Heparin 7,000 bolus. Access RUA AVG, BFR 500, DFR 800, Time 3:30  Past Medical History  Diagnosis Date  . ESRD (end stage renal disease) on dialysis   . Hyperparathyroidism, secondary renal   . CAD (coronary artery disease) of artery bypass graft     CABG in 2002  . PAD (peripheral artery disease)     Not a candidate for revascularization  . Chronic constipation   . GERD (gastroesophageal reflux disease)   . Anemia of chronic renal failure   . Tardive dyskinesia     Secondary to Reglan  . Depression   . Neuropathy   . Diabetic retinopathy   . Diastolic CHF, chronic     Grade 2  . Pulmonary embolism 06/2003  . Complication of anesthesia     "sleepy afterwards; due to her being dialysis pt"  . Hyperlipidemia   . Myocardial infarction 2002  . Stroke 2002    "w/left side" residual weakness  . Dialysis patient 01/17/11    [name]; M, W, F; via right upper arm AVF  . Diabetes mellitus     type II  . CHF (congestive heart failure)   . CVA  (cerebral infarction)     right brain with minimal left hemiparesis  . Pneumonia 10/2009; 11/2009    ?pneumonia 01/17/11    Past Surgical History  Procedure Date  . Coronary artery bypass graft 2002  . Laparoscopic cholecystectomy 2002  . Av fistula placement     times 4  . Trigger finger release   . Tubal ligation   . Eye examination under anesthesia w/ retinal cryotherapy and retinal laser   . Coronary angioplasty with stent placement   . Breast biopsy 06/2010  . Cataract extraction 07/2010    right sided   . Toe amputation     left 2nd toe secondary to osteomyelitis    Family History  Problem Relation Age of Onset  . Diabetes Mother   . Cirrhosis Mother   . Diabetes Father   . Heart attack Father   . Diabetes Sister   . Diabetes Sister   . Diabetes Sister     Social History:  reports that she has never smoked. She has never used smokeless tobacco. She reports that she uses illicit drugs (Marijuana). She reports that she does not drink alcohol.  Allergies:  Allergies  Allergen Reactions  . Codeine Other (See Comments)    unknown  . Lipitor (Atorvastatin Calcium) Other (See Comments)    unknown  . Reglan Other (See Comments)    unknown  Medications: I have reviewed the patient's current medications.    Marland Kitchen amLODipine  5 mg Oral QHS  . aspirin  81 mg Oral Daily  . ceFEPime (MAXIPIME) IV  500 mg Intravenous Q24H  . cinacalcet  90 mg Oral Q breakfast  . fenofibrate  160 mg Oral Daily  . gabapentin  400 mg Oral Q0700  . glimepiride  4 mg Oral Q0700  . heparin  5,000 Units Subcutaneous Q8H  . insulin aspart  0-9 Units Subcutaneous TID WC  . insulin glargine  7 Units Subcutaneous QHS  . multivitamin  1 tablet Oral Daily  . pantoprazole  40 mg Oral Q1200  . paricalcitol  10 mcg Intravenous Q M,W,F-HD  . pentafluoroprop-tetrafluoroeth      . polyethylene glycol  17 g Oral Daily  . pregabalin  50 mg Oral QPM  . sevelamer  3,200 mg Oral TID WC  . sevelamer   3,200 mg Oral TID WC  . sodium chloride  3 mL Intravenous Q12H  . sodium phosphate  1 enema Rectal Once  . vancomycin  1,500 mg Intravenous Once  . vancomycin  1,000 mg Intravenous Q M,W,F-HD  . DISCONTD: amLODipine  5 mg Oral Daily  . DISCONTD: ceFEPime (MAXIPIME) IV  1 g Intravenous Q24H  . DISCONTD: insulin glargine  10 Units Subcutaneous QHS   Zemplar , otherwise no ESA or Iron  Results for orders placed during the hospital encounter of 01/17/11 (from the past 48 hour(s))  MRSA PCR SCREENING     Status: Normal   Collection Time   01/17/11  2:39 PM      Component Value Range Comment   MRSA by PCR NEGATIVE  NEGATIVE    GLUCOSE, CAPILLARY     Status: Abnormal   Collection Time   01/17/11  3:47 PM      Component Value Range Comment   Glucose-Capillary 179 (*) 70 - 99 (mg/dL)    Comment 1 Documented in Chart      Comment 2 Notify RN     GLUCOSE, CAPILLARY     Status: Abnormal   Collection Time   01/17/11  4:40 PM      Component Value Range Comment   Glucose-Capillary 153 (*) 70 - 99 (mg/dL)    Comment 1 Documented in Chart      Comment 2 Notify RN     COMPREHENSIVE METABOLIC PANEL     Status: Abnormal   Collection Time   01/17/11  4:55 PM      Component Value Range Comment   Sodium 137  135 - 145 (mEq/L)    Potassium 4.0  3.5 - 5.1 (mEq/L)    Chloride 93 (*) 96 - 112 (mEq/L)    CO2 30  19 - 32 (mEq/L)    Glucose, Bld 155 (*) 70 - 99 (mg/dL)    BUN 22  6 - 23 (mg/dL)    Creatinine, Ser 1.61 (*) 0.50 - 1.10 (mg/dL)    Calcium 09.6  8.4 - 10.5 (mg/dL)    Total Protein 8.7 (*) 6.0 - 8.3 (g/dL)    Albumin 3.5  3.5 - 5.2 (g/dL)    AST 40 (*) 0 - 37 (U/L)    ALT 35  0 - 35 (U/L)    Alkaline Phosphatase 90  39 - 117 (U/L)    Total Bilirubin 0.3  0.3 - 1.2 (mg/dL)    GFR calc non Af Amer 9 (*) >90 (mL/min)    GFR calc Af Amer 10 (*) >  90 (mL/min)   PHOSPHORUS     Status: Normal   Collection Time   01/17/11  4:55 PM      Component Value Range Comment   Phosphorus 3.9   2.3 - 4.6 (mg/dL)   MAGNESIUM     Status: Normal   Collection Time   01/17/11  4:55 PM      Component Value Range Comment   Magnesium 2.0  1.5 - 2.5 (mg/dL)   PRO B NATRIURETIC PEPTIDE     Status: Abnormal   Collection Time   01/17/11  4:55 PM      Component Value Range Comment   BNP, POC 31334.0 (*) 0 - 125 (pg/mL)   CBC     Status: Abnormal   Collection Time   01/17/11  4:55 PM      Component Value Range Comment   WBC 8.8  4.0 - 10.5 (K/uL)    RBC 4.55  3.87 - 5.11 (MIL/uL)    Hemoglobin 12.4  12.0 - 15.0 (g/dL)    HCT 56.2  13.0 - 86.5 (%)    MCV 82.6  78.0 - 100.0 (fL)    MCH 27.3  26.0 - 34.0 (pg)    MCHC 33.0  30.0 - 36.0 (g/dL)    RDW 78.4 (*) 69.6 - 15.5 (%)    Platelets 250  150 - 400 (K/uL)   DIFFERENTIAL     Status: Normal   Collection Time   01/17/11  4:55 PM      Component Value Range Comment   Neutrophils Relative 69  43 - 77 (%)    Neutro Abs 6.1  1.7 - 7.7 (K/uL)    Lymphocytes Relative 19  12 - 46 (%)    Lymphs Abs 1.7  0.7 - 4.0 (K/uL)    Monocytes Relative 8  3 - 12 (%)    Monocytes Absolute 0.7  0.1 - 1.0 (K/uL)    Eosinophils Relative 4  0 - 5 (%)    Eosinophils Absolute 0.3  0.0 - 0.7 (K/uL)    Basophils Relative 0  0 - 1 (%)    Basophils Absolute 0.0  0.0 - 0.1 (K/uL)   HEMOGLOBIN A1C     Status: Abnormal   Collection Time   01/17/11  4:55 PM      Component Value Range Comment   Hemoglobin A1C 10.6 (*) <5.7 (%)    Mean Plasma Glucose 258 (*) <117 (mg/dL)   GLUCOSE, CAPILLARY     Status: Abnormal   Collection Time   01/17/11  9:17 PM      Component Value Range Comment   Glucose-Capillary 188 (*) 70 - 99 (mg/dL)   BASIC METABOLIC PANEL     Status: Abnormal   Collection Time   01/18/11  5:40 AM      Component Value Range Comment   Sodium 137  135 - 145 (mEq/L)    Potassium 3.9  3.5 - 5.1 (mEq/L)    Chloride 94 (*) 96 - 112 (mEq/L)    CO2 31  19 - 32 (mEq/L)    Glucose, Bld 63 (*) 70 - 99 (mg/dL)    BUN 29 (*) 6 - 23 (mg/dL)    Creatinine,  Ser 2.95 (*) 0.50 - 1.10 (mg/dL)    Calcium 9.9  8.4 - 10.5 (mg/dL)    GFR calc non Af Amer 7 (*) >90 (mL/min)    GFR calc Af Amer 8 (*) >90 (mL/min)   CBC     Status:  Abnormal   Collection Time   01/18/11  5:40 AM      Component Value Range Comment   WBC 6.6  4.0 - 10.5 (K/uL)    RBC 4.33  3.87 - 5.11 (MIL/uL)    Hemoglobin 11.3 (*) 12.0 - 15.0 (g/dL)    HCT 60.4  54.0 - 98.1 (%)    MCV 84.5  78.0 - 100.0 (fL)    MCH 26.1  26.0 - 34.0 (pg)    MCHC 30.9  30.0 - 36.0 (g/dL)    RDW 19.1 (*) 47.8 - 15.5 (%)    Platelets 247  150 - 400 (K/uL)   GLUCOSE, CAPILLARY     Status: Abnormal   Collection Time   01/18/11  7:49 AM      Component Value Range Comment   Glucose-Capillary 54 (*) 70 - 99 (mg/dL)    Comment 1 Notify RN      Comment 2 Documented in Chart     GLUCOSE, CAPILLARY     Status: Abnormal   Collection Time   01/18/11  8:32 AM      Component Value Range Comment   Glucose-Capillary 60 (*) 70 - 99 (mg/dL)    Comment 1 Notify RN      Comment 2 Documented in Chart     GLUCOSE, CAPILLARY     Status: Abnormal   Collection Time   01/18/11  9:29 AM      Component Value Range Comment   Glucose-Capillary 189 (*) 70 - 99 (mg/dL)    Comment 1 Notify RN      Comment 2 Documented in Chart       X-ray Chest Pa And Lateral  01/18/2011  *RADIOLOGY REPORT*  Clinical Data: Fever and cough  CHEST - 2 VIEW  Comparison: 10/05/2010  Findings: Low lung volumes.  Central vascular congestion and edema. No pleural effusion.  No pneumothorax.  Moderate cardiomegaly.  IMPRESSION: Mild CHF with central vascular congestion and edema.  Original Report Authenticated By: Donavan Burnet, M.D.    Negative except pleural chest pain, productive clear cough, SOB incl DOE, chronic low back pain  Blood pressure 117/69, pulse 71, temperature 98.5 F (36.9 C), temperature source Oral, resp. rate 20, SpO2 95.00%.  Physical Exam: General: Vital signs reviewed and noted.   Lungs:   Coarse, slightly tight  bilat with few rales and scattered wheezes  Heart: RRR.   Abdomen:  BS normoactive. Soft, Nondistended, non-tender.      Extremities: pretibial edema.   Dialysis Access: RUA AVG + T/B  Assessment/Plan: 1 CAP- admitted by teaching services; empirically placed on Antbx for presumed RLL Pneumonia in the outpatient setting; T-max 100.8 since adm; WBC wnl; on IV Maxipime, re-eval with f/u CXR  2. Pulmonary Edema- CXR on adm mild CHF with central vascular congestion and edema; history off large fluid gains in outpatient setting and unable to tolerate fluid removal secondary to low BP's. Only dialyzing 3:30 and suspect will require increase time to facilitate fluid removal. Of note, eDW has been increased 0.5 kg x 3 on 10/29; 11/7; and 11/14 (total 1 kg only)  Due to cramping and low BP's with Tx; she has been offered extra tx's in the past to facilitate fluid removal as well. Will plan to dialyze her today and tomorrow 4 hrs each and schedule f/u CXR tomorrow after HD; will need new eDW. 3. Chest Pain- work-up in progress; PMH multi vessel CAD; 1st cardiac enzyme positive (0.74) with elevated BNP; EKG  pending; suspect secondary to resp status; defer to primary 4. ESRD: MWF [facility]  5. Hypertension/Volume: chronic low BP's with tx's; due to Reglan allergy; unable to add Midodrine; change norvasc qhs; increase tx time 30 min for now; extra tx today per # 2   6. Anemia of ESRD: No Epo or iron outpatient setting; T-sat 29% with high Ferritin 3065 on 12/15/10; follow trend  7. Metabolic Bone Disease: on Sensipar, Zemplar, and Renvela; iPTH pend; most recent phos 5.6, cor ca 10.1 on 12/15/10 in outpatient setting; follow closely 8. DM- poorly controlled. Hgb A1c 8.7% on 12/15/10 in oupt setting and now 10.6% on 01/17/11; CBG's and SSI with teaching services following; would appreciate help with this while inpt to be followed up by PACE at discharge    Samuel Germany, FNP-C [facility] Pager [phone] 01/18/2011, 11:08 AM   I have seen and examined this patient and agree with plan. Admitted for treatment of pneumonia after failure of outpatient treatment from primary care MD (IM rocephin/moxifloxacin).  On vanco/cefipime.  On Dialysis today, then again tomorrow for volume removal.  (Outpt issues with UF due to chronically low BP's).  Usual day = MWF.  Pharmacy has stated we cannot use midodrine in this patient due to allergy to metaclopramide - patient states she thinks had "tongue protrusion" issues (mvmt disorder) - not true "allergy - am unclear why this precludes use of midodrine.  Skylier Kretschmer B,MD 01/18/2011 6:56 PM

## 2011-01-18 NOTE — Consult Note (Signed)
Pt. Seen and examined. Agree with the NP/PA-C note as written, except for the following:  Chest pain has resolved. Troponin elevated, however, CK and MB are normal. This is not consistent with NSTEMI. Unclear if chest pain is of cardiac origin. Certainly she has underlying CAD with prior CABG and a native LCX lesion that could not be intervened upon. I agree with trending enzymes, however, if CK and MB are not elevated, this is either not an acute process or non-cardiac troponin elevation, likely due to renal failure.  Unfortunately, BNP is basically useless in ESRD patients on dialysis. Would manage volume status based on dry weights. There are subtle EKG changes. At this point, I don't think there is clear indication for cardiac cath - I would medically manage for now. Agree with continuing aspirin.  No indication for heparin unless chest pain returns or CK, MB and troponin become elevated. Would restart plavix 75 mg daily as patient is apparently taking this at home - no load.  Will follow along with you. Thanks for the consult.  Chrystie Nose, MD Attending Cardiologist [facility]

## 2011-01-18 NOTE — Progress Notes (Signed)
Subjective: No events overnight.  Pt c/o mild CP this a.m., first trop positive at 0.76.  EKG shows some ST depressions in V3-V4, cardiology consulted.  No heparin at this time.  Objective: Vital signs in last 24 hours: Filed Vitals:   01/18/11 0524 01/18/11 0945 01/18/11 1300 01/18/11 1400  BP: 109/63 117/69  126/61  Pulse: 65 71  72  Temp: 98.3 F (36.8 C) 98.5 F (36.9 C)  98.4 F (36.9 C)  TempSrc: Oral Oral  Oral  Resp: 20 20  20   Weight:   173 lb 15.1 oz (78.9 kg)   SpO2: 92% 95%  96%   Weight change:   Intake/Output Summary (Last 24 hours) at 01/18/11 1546 Last data filed at 01/18/11 1530  Gross per 24 hour  Intake    290 ml  Output      2 ml  Net    288 ml   Physical Exam: Physical Exam GEN: NAD.  Alert and oriented x 3.  Pleasant, conversant, and cooperative to exam. RESP:  Scattered crackles in the bases, diffuse rhonchi in bilateral lung fields. CARDIOVASCULAR: RRR, S1, S2, 3/6 HSM @ L sternal border ABDOMEN: soft, NT/ND, NABS EXT: warm and dry. No edema in b/l LE  Lab Results: Basic Metabolic Panel:  Lab 01/18/11 2130 01/17/11 1655  NA 137 137  K 3.9 4.0  CL 94* 93*  CO2 31 30  GLUCOSE 63* 155*  BUN 29* 22  CREATININE 6.09* 4.97*  CALCIUM 9.9 10.1  MG -- 2.0  PHOS -- 3.9   Liver Function Tests:  Lab 01/17/11 1655  AST 40*  ALT 35  ALKPHOS 90  BILITOT 0.3  PROT 8.7*  ALBUMIN 3.5   CBC:  Lab 01/18/11 0540 01/17/11 1655  WBC 6.6 8.8  NEUTROABS -- 6.1  HGB 11.3* 12.4  HCT 36.6 37.6  MCV 84.5 82.6  PLT 247 250   Cardiac Enzymes:  Lab 01/18/11 1146  CKTOTAL 134  CKMB 2.9  CKMBINDEX --  TROPONINI 0.74*   BNP:  Lab 01/17/11 1655  POCBNP 31334.0*   CBG:  Lab 01/18/11 1143 01/18/11 0929 01/18/11 0832 01/18/11 0749 01/17/11 2117 01/17/11 1640  GLUCAP 156* 189* 60* 54* 188* 153*   Hemoglobin A1C:  Lab 01/17/11 1655  HGBA1C 10.6*    Micro Results: Recent Results (from the past 240 hour(s))  MRSA PCR SCREENING     Status:  Normal   Collection Time   01/17/11  2:39 PM      Component Value Range Status Comment   MRSA by PCR NEGATIVE  NEGATIVE  Final    Studies/Results: X-ray Chest Pa And Lateral  01/18/2011  *RADIOLOGY REPORT*  Clinical Data: Fever and cough  CHEST - 2 VIEW  Comparison: 10/05/2010  Findings: Low lung volumes.  Central vascular congestion and edema. No pleural effusion.  No pneumothorax.  Moderate cardiomegaly.  IMPRESSION: Mild CHF with central vascular congestion and edema.  Original Report Authenticated By: Donavan Burnet, M.D.   Medications: I have reviewed the patient's current medications. Scheduled Meds:   . amLODipine  5 mg Oral QHS  . aspirin  81 mg Oral Daily  . ceFEPime (MAXIPIME) IV  1 g Intravenous Q24H  . cinacalcet  90 mg Oral Q breakfast  . fenofibrate  160 mg Oral Daily  . gabapentin  400 mg Oral QAM  . glimepiride  4 mg Oral QAM  . heparin  5,000 Units Subcutaneous Q8H  . insulin aspart  0-9 Units Subcutaneous TID  WC  . insulin glargine  10 Units Subcutaneous QHS  . multivitamin  1 tablet Oral Daily  . pantoprazole  40 mg Oral Q1200  . paricalcitol  10 mcg Intravenous Q M,W,F-HD  . polyethylene glycol  17 g Oral Daily  . pregabalin  50 mg Oral QPM  . sevelamer  3,200 mg Oral TID WC  . sevelamer  3,200 mg Oral TID WC  . sodium chloride  3 mL Intravenous Q12H  . sodium phosphate  1 enema Rectal Once  . vancomycin  1,500 mg Intravenous Once  . DISCONTD: amLODipine  5 mg Oral Daily  . DISCONTD: ceFEPime (MAXIPIME) IV  1 g Intravenous Q8H  . DISCONTD: levofloxacin (LEVAQUIN) IV  750 mg Intravenous Q24H  . DISCONTD: vancomycin  1,500 mg Intravenous Once   Continuous Infusions:   . DISCONTD: sodium chloride     PRN Meds:.sodium chloride, acetaminophen, acetaminophen, albuterol, alteplase, benzonatate, calcium carbonate (dosed in mg elemental calcium), docusate sodium, feeding supplement (NEPRO CARB STEADY), heparin, heparin, HYDROcodone-acetaminophen, ipratropium,  lidocaine, lidocaine-prilocaine, pentafluoroprop-tetrafluoroeth, phenol, polyvinyl alcohol, promethazine, sodium chloride, sorbitol, DISCONTD: vancomycin  Assessment/Plan: # Cough/Hoarse Throat Given presentation and sx, ddx remains PNA vs URI.  In the setting of CXR findings indicating pulmonary edema, even more likely to be viral given lack of leukocytosis, lack of high fever, and URI type symptoms.  Nonetheless, pt felt much improved today and we'll con't with abx for one more day before narrowing.  Con't to follow up cultures, test results.  Satting well on 2L/[place], could probably be weaned after dialysis if they can get enough fluid off. - Sputum culture  - Blood culture  - con't Vancomycin  - con' t Cefepime - Nebs when necessary  - Zofran  - strep, legionella ag, can't be collected 2/2 anuria  #Chest Pain Pt with one episode of CP this a.m., now with trop positive to 0.74 and EKG changes showing ST depressions in V3-V4 with TW inversions.  Likely trop leak in the setting of cardiac strain and CHF, compounded by ESRD.  However, pt has extensive h/o CAD with CABG and PCI, last cathed in 2011 with patent grafts but some occlusive dz that was not intervened on.  Spoke with on call MD for [facility], who agreed consultation was warranted given EKG changes and prior history.  Pt also has h/o atypical sx with MI, so will monitor closely - con't CE cycling - cardiology consult - dialysis to remove fluid and possible heart strain - consider heparin gtt if sx/ekg/trop worsen  # Pulmonary Edema CXR indicates fluid overload, and pt has h/o difficult fluid balance with dialysis.  Please see renal consult note for full details. Renal aware, planning for dialysis today and tomorrow. - appreciate renal assistance - f/u CXR post dialysis  # Back pain  Likely mechanical in nature, the differential for lower back pain and this patient remains broad, including muscle skeletal, oncologic, and various  other diagnoses. We'll have to evaluate to decide if imaging is necessary, will wait for the primary team to discuss.  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  Pt CBG was down to 54 this a.m.  Will decrease qhs lantus to 7 units and con't iss. - lantus 7 units qhs - SSI  - con't gabapentin - con't glimeperide   #  HTN  - norvasc qhs  # Chronic constipation  Pt felt constipated today and asked for enema - fleets enema   # GERD  - con't pantoprazole   #PPx Lovenox   LOS: 1 day   WILDMAN-TOBRINER, BEN 01/18/2011, 3:46 PM

## 2011-01-18 NOTE — Progress Notes (Signed)
Subjective: -nausea after eating a bite of a [place] sandwich (no vomiting) -sore throat reportedly worse today vs. yesterday  Objective: Vital signs in last 24 hours: Filed Vitals:   01/17/11 1400 01/17/11 1800 01/17/11 2118 01/18/11 0524  BP: 111/58 120/71 104/64 109/63  Pulse: 80 85 73 65  Temp: 100.3 F (37.9 C) 100.8 F (38.2 C) 97.3 F (36.3 C) 98.3 F (36.8 C)  TempSrc: Oral Oral Oral Oral  Resp: 16 22 20 20   SpO2: 98% 84% 97% 92%   Weight change:   Intake/Output Summary (Last 24 hours) at 01/18/11 0835 Last data filed at 01/17/11 1900  Gross per 24 hour  Intake     50 ml  Output      0 ml  Net     50 ml   Pt reports being normally oliguric/anuric given ESRD.  Physical Exam: EAV:[id number] comfortably in bed in no acute distress. Still speaking in whisper and interrupted by frequent coughing. Eyes:pupils equal round and minimally reactive to light, gray corneal rim c/w arcus senilis, muddy sclera Oropharynx: MMM, difficult to visualize posteriorly given anatomy and thick secretions JY:[id number], no murmurs appreciated Resp: course breath sounds appreciated in mid to lower lobes bilaterally MVH:QION, tenderness to deep palpation in RLQ Mild tenderness to palpation in L costovertebral region  Lab Results: Basic Metabolic Panel:  Lab 01/18/11 6295 01/17/11 1655  NA 137 137  K 3.9 4.0  CL 94* 93*  CO2 31 30  GLUCOSE 63* 155*  BUN 29* 22  CREATININE 6.09* 4.97*  CALCIUM 9.9 10.1  MG -- 2.0  PHOS -- 3.9   Liver Function Tests:  Lab 01/17/11 1655  AST 40*  ALT 35  ALKPHOS 90  BILITOT 0.3  PROT 8.7*  ALBUMIN 3.5   CBC:  Lab 01/18/11 0540 01/17/11 1655  WBC 6.6 8.8  NEUTROABS -- 6.1  HGB 11.3* 12.4  HCT 36.6 37.6  MCV 84.5 82.6  PLT 247 250   BNP:  Lab 01/17/11 1655  POCBNP 31334.0*   CBG:  Lab 01/18/11 0749 01/17/11 2117 01/17/11 1640 01/17/11 1547  GLUCAP 54* 188* 153* 179*   Hemoglobin A1C:  Lab 01/17/11 1655  HGBA1C 10.6*     Micro Results: Recent Results (from the past 240 hour(s))  MRSA PCR SCREENING     Status: Normal   Collection Time   01/17/11  2:39 PM      Component Value Range Status Comment   MRSA by PCR NEGATIVE  NEGATIVE  Final    Studies/Results: X-ray Chest Pa And Lateral  01/18/2011  *RADIOLOGY REPORT*  Clinical Data: Fever and cough  CHEST - 2 VIEW  Comparison: 10/05/2010  Findings: Low lung volumes.  Central vascular congestion and edema. No pleural effusion.  No pneumothorax.  Moderate cardiomegaly.  IMPRESSION: Mild CHF with central vascular congestion and edema.  Original Report Authenticated By: Donavan Burnet, M.D.   Medications: I have reviewed the patient's current medications. Scheduled Meds:   . amLODipine  5 mg Oral Daily  . aspirin  81 mg Oral Daily  . ceFEPime (MAXIPIME) IV  1 g Intravenous Q24H  . cinacalcet  90 mg Oral Q breakfast  . fenofibrate  160 mg Oral Daily  . gabapentin  400 mg Oral QAM  . glimepiride  4 mg Oral QAM  . heparin  5,000 Units Subcutaneous Q8H  . insulin aspart  0-9 Units Subcutaneous TID WC  . insulin glargine  10 Units Subcutaneous QHS  . multivitamin  1 tablet  Oral Daily  . pantoprazole  40 mg Oral Q1200  . polyethylene glycol  17 g Oral Daily  . pregabalin  50 mg Oral QPM  . sevelamer  3,200 mg Oral TID WC  . sevelamer  3,200 mg Oral TID WC  . sodium chloride  3 mL Intravenous Q12H  . vancomycin  1,500 mg Intravenous Once  . DISCONTD: ceFEPime (MAXIPIME) IV  1 g Intravenous Q8H  . DISCONTD: levofloxacin (LEVAQUIN) IV  750 mg Intravenous Q24H  . DISCONTD: vancomycin  1,500 mg Intravenous Once   Continuous Infusions:   . DISCONTD: sodium chloride     PRN Meds:.acetaminophen, albuterol, benzonatate, HYDROcodone-acetaminophen, ipratropium, phenol, polyvinyl alcohol, promethazine, sodium chloride, DISCONTD: vancomycin  Assessment/Plan: Pleasant 55 y/o woman with extensive co-morbidities including ESRD, diastolic CHF, CAD, and DM2,  admitted on 11/26 for cough and sore throat refractory to ceftriaxone and moxifloxacin and now being treated empirically with vanc and cefepime initiated on day of admission.  # respiratory infection Clinical presentation of cough and sore throat c/w URI. Pt does also endorse body aches that started around the time she developed cough/sore-throat. WBC is notably WNL at 6.6. Vitals stable at present though max temp of 38.2 and minimum sat of 84% yesterday evening (11/26). CXR shows evidence of central vascular congestion and pulmonary edema w/o overt evidence of pneumonia or consolidation. CXR, normal WBC, and lack of significant fever decreases likelihood of pneumonia. Pulmonary edema likely secondary to CHF. Given likelihood of bacteria URI (given ~3 weeks), possibility of subsequent lower respiratory infection, and low physiologic reserve, consider keeping abx temporarily on board until URI sx begin to remit -labs:  *f/u sputum culture and gram stain  *f/u blood culture  *continue following bmet  *f/u strep and legionella ag -symptomatic treatments:  *benzonatate antitussive  *phenol spray for sore throat  *ipratropium and albuterol nebs prn for breathing difficulty  *acetaminophen/hydrocodone for pain  *ondansetron for nausea -oxygen therapy: 2L via nasal cannula, target sat > 92% -abx:  *vanc (11/26 - present) - pharmacy is monitoring levels, help appreciated  *cefepime (11/26 - present)  *levofloxacin? (PACE protocol may not apply given CXR)  *moxifloxacin (~11/23 - 11/26)  *ceftriaxone IM (11/23 one time)  # Back pain  Likely mechanical in nature, the differential for lower back pain and this patient remains broad, including muscle skeletal, oncologic, and various other diagnoses. Follow up with PCP unless becomes acutely worse. 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 glc has been generally running in high 100's since admission but this morning was 54. A1C from 11/26 was 10.6. - glargine 10u qhs  - SSI - aspart - f/u with PCP re optimizing regimen given recent A1C - con't gabapentin  - note that home med list included both gabapentin and pregabalin  # Secondary hyperparathyroidism - f/u bmet  - PTH pending - cinacalcet - Ca mimetic - sevelamer - phosphate binder  # HTN - presently normotensive. - amlodipine  # HLD - fenofibrate (allergy is noted to lipitor)  # s/p stroke with reported residual L side  weakness - ASA 81 mg qd   # Peripheral artery disease - extremities appear well-perfused now   # Chronic constipation  -Episode of recent loose BM, if continued loose BM consider holding home constipation meds and c. diff screening  # GERD  - con't pantoprazole   # depression - f/u w/PCP  # DVT prophylaxis - heparin 5k U qd subcut  # FEN -renal diet -multivitamin qd  # full code - verified on 11/27 -social work consult for discussion of advance directives  UPDATE: Please see my note for full details of afternoon events.  LOS: 1 day   Alvino Chapel 01/18/2011, 8:35 AM

## 2011-01-18 NOTE — Progress Notes (Signed)
Physical Therapy Evaluation Patient Details Name: Faith [name] MRN: [id number] DOB: 10-Jun-1955 Today's Date: 01/18/2011  Problem List:  Patient Active Problem List  Diagnoses  . ESRD (end stage renal disease) on dialysis  . Diabetes mellitus type II  . Secondary hyperparathyroidism  . CAD (coronary artery disease) of artery bypass graft  . PAD (peripheral artery disease)  . Chronic constipation  . GERD (gastroesophageal reflux disease)  . Anemia of chronic renal failure  . Tardive dyskinesia  . CVA (cerebral infarction)  . Depression  . Neuropathy  . Diabetic retinopathy associated with type 2 diabetes mellitus  . Diastolic CHF, chronic  . History of Osteomyelitis of toe of left foot    Past Medical History:  Past Medical History  Diagnosis Date  . ESRD (end stage renal disease) on dialysis   . Hyperparathyroidism, secondary renal   . CAD (coronary artery disease) of artery bypass graft     CABG in 2002  . PAD (peripheral artery disease)     Not a candidate for revascularization  . Chronic constipation   . GERD (gastroesophageal reflux disease)   . Anemia of chronic renal failure   . Tardive dyskinesia     Secondary to Reglan  . Depression   . Neuropathy   . Diabetic retinopathy   . Diastolic CHF, chronic     Grade 2  . Pulmonary embolism 06/2003  . Complication of anesthesia     "sleepy afterwards; due to her being dialysis pt"  . Hyperlipidemia   . Myocardial infarction 2002  . Stroke 2002    "w/left side" residual weakness  . Dialysis patient 01/17/11    [name]; M, W, F; via right upper arm AVF  . Diabetes mellitus     type II  . CHF (congestive heart failure)   . CVA (cerebral infarction)     right brain with minimal left hemiparesis  . Pneumonia 10/2009; 11/2009    ?pneumonia 01/17/11   Past Surgical History:  Past Surgical History  Procedure Date  . Coronary artery bypass graft 2002  . Laparoscopic cholecystectomy 2002  . Av fistula  placement     times 4  . Trigger finger release   . Tubal ligation   . Eye examination under anesthesia w/ retinal cryotherapy and retinal laser   . Coronary angioplasty with stent placement   . Breast biopsy 06/2010  . Cataract extraction 07/2010    right sided   . Toe amputation     left 2nd toe secondary to osteomyelitis    PT Assessment/Plan/Recommendation PT Assessment Clinical Impression Statement: Pt with decreased activity tolerance compared to baseline and will benefit from acute therapy to maximize function before return home. PT Recommendation/Assessment: Patient will need skilled PT in the acute care venue PT Problem List: Decreased activity tolerance;Decreased knowledge of use of DME Barriers to Discharge: Decreased caregiver support Barriers to Discharge Comments: Spouse works and no one available if spouse or aide not prsent PT Therapy Diagnosis : Difficulty walking PT Plan PT Frequency: Min 3X/week PT Treatment/Interventions: Gait training;DME instruction;Functional mobility training;Patient/family education;Therapeutic exercise PT Recommendation Follow Up Recommendations: Home health PT Equipment Recommended: None recommended by PT PT Goals  Acute Rehab PT Goals PT Goal Formulation: With patient Time For Goal Achievement: 7 days Pt will go Supine/Side to Sit: with modified independence PT Goal: Supine/Side to Sit - Progress: Other (comment) Pt will go Sit to Supine/Side: with modified independence PT Goal: Sit to Supine/Side - Progress: Other (comment) Pt will  Transfer Sit to Stand/Stand to Sit: with modified independence PT Transfer Goal: Sit to Stand/Stand to Sit - Progress: Other (comment) Pt will Ambulate: >150 feet;with modified independence;with least restrictive assistive device PT Goal: Ambulate - Progress: Other (comment)  PT Evaluation Precautions/Restrictions    Prior Functioning  Home Living Lives With: Spouse Receives Help From: Personal care  attendant;Family Type of Home: House Home Layout: One level Home Access: Level entry Bathroom Shower/Tub: Engineer, manufacturing systems: Standard Home Adaptive Equipment: Walker - rolling;Wheelchair - manual;Shower chair with back Prior Function Level of Independence: Needs assistance with ADLs;Independent with transfers;Independent with basic ADLs;Needs assistance with homemaking 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) Meal Prep: Maximal (spouse cooks) Light Housekeeping: Maximal (aide assists with laundry) Driving: No Vocation: On disability Cognition Cognition Arousal/Alertness: Awake/alert Overall Cognitive Status: Appears within functional limits for tasks assessed Sensation/Coordination   Extremity Assessment   Mobility (including Balance) Bed Mobility Bed Mobility: No Transfers Transfers: Yes Sit to Stand: 5: Supervision;With armrests;From chair/3-in-1 Sit to Stand Details (indicate cue type and reason): cueing for safety Stand to Sit: 5: Supervision;With armrests;To chair/3-in-1 Stand to Sit Details: cueing for safety Ambulation/Gait Ambulation/Gait: Yes Ambulation/Gait Assistance: 5: Supervision Ambulation/Gait Assistance Details (indicate cue type and reason): cueing for posture and to step into RW. Pt with RW too high but refused to have it lowered fully to appropriate height Ambulation Distance (Feet): 150 Feet Assistive device: Rolling walker Gait Pattern: Within Functional Limits, pt with O2 sats 96-98 throughout on 2.5 to 3 L Stairs: No  Balance Balance Assessed: No (pt used AD at home) Exercise    End of Session PT - End of Session Activity Tolerance: Patient limited by fatigue Patient left: in chair;with call bell in reach Nurse Communication: Mobility status for ambulation General Behavior During Session: [facility] for tasks performed Cognition: [facility] for tasks performed  Delorse Lek 01/18/2011, 2:05 PM  Toney Sang, PT [phone]

## 2011-01-18 NOTE — Progress Notes (Addendum)
Inpatient Diabetes Program Recommendations  AACE/ADA: New Consensus Statement on Inpatient Glycemic Control (2009)  Target Ranges:  Prepandial:   less than 140 mg/dL      Peak postprandial:   less than 180 mg/dL (1-2 hours)      Critically ill patients:  140 - 180 mg/dL   Hypoglycemic this AM: CBG 54 mg/dl after pt received 10 units Lantus last evening.  Inpatient Diabetes Program Recommendations Insulin - Basal: Please decrease Lantus to 7 units QHS (if pt continues to have hypoglycemia in the AM)  Pt told me she has been taking Prednisone for a recent bout of PNA.  May be a partial explanation for her elevated A1C of 10.6%.  Follows up with Dr. Marny Lowenstein for her DM management.  Pt told me her husband gets her meds for her and takes her to her MD appts.  Pt told me she checks her CBGs tid at home and regularly takes her insulin doses and her Amaryl.  Encouraged pt to continue to check CBGs and take meds as prescribed.  Will follow.

## 2011-01-19 ENCOUNTER — Other Ambulatory Visit: Payer: Self-pay

## 2011-01-19 ENCOUNTER — Inpatient Hospital Stay (HOSPITAL_COMMUNITY): Payer: Medicare (Managed Care)

## 2011-01-19 LAB — RENAL FUNCTION PANEL
Albumin: 3.7 g/dL (ref 3.5–5.2)
BUN: 14 mg/dL (ref 6–23)
Calcium: 10.7 mg/dL — ABNORMAL HIGH (ref 8.4–10.5)
Creatinine, Ser: 4.06 mg/dL — ABNORMAL HIGH (ref 0.50–1.10)
GFR calc Af Amer: 13 mL/min — ABNORMAL LOW (ref >90)
Glucose, Bld: 132 mg/dL — ABNORMAL HIGH (ref 70–99)
Phosphorus: 4.2 mg/dL (ref 2.3–4.6)
Potassium: 3.7 mEq/L (ref 3.5–5.1)

## 2011-01-19 LAB — CBC
HCT: 43.3 % (ref 36.0–46.0)
Hemoglobin: 13.5 g/dL (ref 12.0–15.0)
Hemoglobin: 12.6 g/dL (ref 12.0–15.0)
MCH: 26.6 pg (ref 26.0–34.0)
MCHC: 31.2 g/dL (ref 30.0–36.0)
MCHC: 31.3 g/dL (ref 30.0–36.0)
MCV: 85.4 fL (ref 78.0–100.0)
RDW: 17.5 % — ABNORMAL HIGH (ref 11.5–15.5)

## 2011-01-19 LAB — PTH, INTACT AND CALCIUM: PTH: 771.4 pg/mL — ABNORMAL HIGH (ref 14.0–72.0)

## 2011-01-19 LAB — GLUCOSE, CAPILLARY
Glucose-Capillary: 141 mg/dL — ABNORMAL HIGH (ref 70–99)
Glucose-Capillary: 136 mg/dL — ABNORMAL HIGH (ref 70–99)

## 2011-01-19 LAB — CARDIAC PANEL(CRET KIN+CKTOT+MB+TROPI)
CK, MB: 2.5 ng/mL (ref 0.3–4.0)
CK, MB: 2.4 ng/mL (ref 0.3–4.0)
Relative Index: 2.4 (ref 0.0–2.5)
Total CK: 101 U/L (ref 7–177)
Troponin I: 0.59 ng/mL (ref <0.30)
Troponin I: 0.52 ng/mL (ref <0.30)

## 2011-01-19 MED ORDER — BIOTENE DRY MOUTH MT LIQD
15.0000 mL | Freq: Two times a day (BID) | OROMUCOSAL | Status: DC
  Administered 2011-01-20 – 2011-01-21 (×2): 15 mL via OROMUCOSAL

## 2011-01-19 MED ORDER — GLIMEPIRIDE 2 MG PO TABS
2.0000 mg | ORAL_TABLET | ORAL | Status: DC
  Administered 2011-01-20 – 2011-01-21 (×2): 2 mg via ORAL
  Filled 2011-01-19 (×3): qty 1

## 2011-01-19 MED ORDER — PENTAFLUOROPROP-TETRAFLUOROETH EX AERO
INHALATION_SPRAY | CUTANEOUS | Status: AC
  Administered 2011-01-19: 1 via TOPICAL
  Filled 2011-01-19: qty 103.5

## 2011-01-19 MED ORDER — PARICALCITOL 5 MCG/ML IV SOLN
INTRAVENOUS | Status: AC
  Administered 2011-01-19: 10 ug via INTRAVENOUS
  Filled 2011-01-19: qty 2

## 2011-01-19 NOTE — Progress Notes (Signed)
Subjective:  No chest pain  Objective:  Vital Signs in the last 24 hours: Temp:  [97.6 F (36.4 C)-99.1 F (37.3 C)] 97.6 F (36.4 C) (11/28 0741) Pulse Rate:  [71-88] 83  (11/28 0741) Resp:  [18-21] 20  (11/28 0741) BP: (91-137)/(54-95) 119/56 mmHg (11/28 0741) SpO2:  [93 %-97 %] 96 % (11/28 0741) FiO2 (%):  [2 %] 2 % (11/27 2059) Weight:  [74.5 kg (164 lb 3.9 oz)-80.5 kg (177 lb 7.5 oz)] 164 lb 3.9 oz (74.5 kg) (11/28 0741)  Intake/Output from previous day: 11/27 0701 - 11/28 0700 In: 240 [P.O.:240] Out: 3320 [Stool:4]  Physical Exam: General appearance: alert, cooperative, no distress and morbidly obese Lungs: diminshed breath sounds with scattered rhonchi Heart: regular rate and rhythm, S1, S2 normal, no murmur, click, rub or gallop   Rate: 82  Rhythm: normal sinus rhythm  Lab Results:  Basename 01/19/11 0640 01/18/11 0540  WBC 8.2 6.6  HGB 13.5 11.3*  PLT 270 247    Basename 01/18/11 0540 01/17/11 1655  NA 137 137  K 3.9 4.0  CL 94* 93*  CO2 31 30  GLUCOSE 63* 155*  BUN 29* 22  CREATININE 6.09* 4.97*    Basename 01/18/11 2338 01/18/11 1146  TROPONINI 0.59* 0.74*   Hepatic Function Panel  Basename 01/17/11 1655  PROT 8.7*  ALBUMIN 3.5  AST 40*  ALT 35  ALKPHOS 90  BILITOT 0.3  BILIDIR --  IBILI --   No results found for this basename: CHOL in the last 72 hours No results found for this basename: INR in the last 72 hours  Imaging: X-ray Chest Pa And Lateral  01/18/2011  *RADIOLOGY REPORT*  Clinical Data: Fever and cough  CHEST - 2 VIEW  Comparison: 10/05/2010  Findings: Low lung volumes.  Central vascular congestion and edema. No pleural effusion.  No pneumothorax.  Moderate cardiomegaly.  IMPRESSION: Mild CHF with central vascular congestion and edema.  Original Report Authenticated By: Donavan Burnet, M.D.    Cardiac Studies:  Assessment/Plan:   Active Problems:  Positive troponin, doubt secondary to cardiac ischemia  URI-?pnuemonia-?CHF  ESRD (end stage renal disease) on dialysis  Diabetes mellitus type II  Secondary hyperparathyroidism  CAD (coronary artery disease) of artery bypass graft  Chronic constipation  GERD (gastroesophageal reflux disease)  Anemia of chronic renal failure  Depression  Neuropathy  Diastolic CHF, chronic   Plan-   Faith Shelter PA-C 01/19/2011, 8:03 AM   Patient seen and examined. Agree with assessment. No chest pain or SOB.  Consider low dose beta-blocker if BP allows. Devansh Riese A,  01/19/2011 1:50 PM

## 2011-01-19 NOTE — Progress Notes (Signed)
Subjective: Interval History: none.  Objective: Vital signs in last 24 hours: Temp:  [97.6 F (36.4 C)-99.1 F (37.3 C)] 97.6 F (36.4 C) (11/28 0741) Pulse Rate:  [72-94] 94  (11/28 0930) Resp:  [18-21] 20  (11/28 0741) BP: (91-138)/(54-95) 119/68 mmHg (11/28 0930) SpO2:  [84 %-100 %] 98 % (11/28 0930) FiO2 (%):  [2 %] 2 % (11/27 2059) Weight:  [74.5 kg (164 lb 3.9 oz)-80.5 kg (177 lb 7.5 oz)] 164 lb 3.9 oz (74.5 kg) (11/28 0741) Weight change:   Intake/Output from previous day: 11/27 0701 - 11/28 0700 In: 240 [P.O.:240] Out: 3320 [Stool:4] Intake/Output this shift:    General: Comfortable Resp: coarse with rhonchi, rales and scattered wheezes cleared with cough Cardio: S1, S2 normal GI: soft, non-tender; bowel sounds normal; no masses,  no organomegaly Extremities: extremities normal, atraumatic, no cyanosis or edema Skin: Skin color, texture, turgor normal. No rashes or lesions Vascular Access:  RUA AVG + T/B   Lab Results:  Basename 01/19/11 0832 01/19/11 0640  WBC 8.1 8.2  HGB 12.6 13.5  HCT 40.3 43.3  PLT 280 270   Renal:  Basename 01/19/11 0640 01/18/11 0540 01/17/11 1655  NA 139 137 --  K 3.7 3.9 --  CL 92* 94* --  CO2 29 31 --  GLUCOSE 132* 63* --  BUN 14 29* --  CREATININE 4.06* 6.09* --  CALCIUM 10.7* 9.9 --  PHOS 4.2 -- 3.9  ALBUMIN 3.7 -- 3.5   No results found for this basename: PTH:2 in the last 72 hours  Iron Studies: No results found for this basename: IRON,TIBC,TRANSFERRIN,FERRITIN in the last 72 hours  Studies/Results: X-ray Chest Pa And Lateral  01/18/2011  *RADIOLOGY REPORT*  Clinical Data: Fever and cough  CHEST - 2 VIEW  Comparison: 10/05/2010  Findings: Low lung volumes.  Central vascular congestion and edema. No pleural effusion.  No pneumothorax.  Moderate cardiomegaly.  IMPRESSION: Mild CHF with central vascular congestion and edema.  Original Report Authenticated By: Donavan Burnet, M.D.    I have reviewed the patient's  current medications.  Assessment/Plan: CAP- remains empirically on antbx for presumed RLL Pneumonia; remains afebrile  2. Pulmonary Edema- resp status improves slightly post HD yesterday; on HD again today with f/u CXR afterwards pending. 3. Chest Pain- chest pain free today with cardiology following 4. ESRD: MWF [facility]  5. Hypertension/Volume: controlled with meds and UF 6. Anemia of ESRD: above goal continue no Epo or iron and follow trend  7. 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 DM- CBG's and SSI with teaching services following   01/19/2011,10:01 AM  LOS: 2 days   Samuel Germany, [facility] Pager [phone]

## 2011-01-19 NOTE — Progress Notes (Signed)
Pleasant 55 y/o woman with extensive co-morbidities including ESRD, diastolic CHF, CAD, and DM2, admitted on 11/26 for cough and sore throat refractory to ceftriaxone and moxifloxacin, and found to have chest pain, elevated troponin, ST depressions, and pulmonary edema on 11/27. CXR today showing evidence of possible pneumonia.  Subjective: -headache, thirsty, and tired after dialysis this morning but no other complaints  Objective: Vital signs in last 24 hours: Filed Vitals:   01/19/11 1130 01/19/11 1150 01/19/11 1200 01/19/11 1217  BP: 88/51 100/52 108/52 112/53  Pulse: 82 83 93 93  Temp:    98 F (36.7 C)  TempSrc:    Oral  Resp:    17  Weight:    73 kg (160 lb 15 oz)  SpO2: 100% 100% 100% 100%   Weight change:   Intake/Output Summary (Last 24 hours) at 01/19/11 1419 Last data filed at 01/19/11 1212  Gross per 24 hour  Intake    120 ml  Output   5213 ml  Net  -5093 ml   Physical Exam: Gen: pleasant but tired-appearing, in no acute distress eyes: pupils equal and round, minimally reactive, muddy/yellow sclera, gray ring c/w arcus senilis OP: MMM, tongue appears white-coated CV: regular rhythm, no murmurs appreciated Resp: course breath sounds auscultated in mid-lower fields bilaterally Abd: soft, tenderness to palpation in LUQ  Lab Results: Basic Metabolic Panel:  Lab 01/19/11 2956 01/18/11 0540 01/17/11 1655  NA 139 137 --  K 3.7 3.9 --  CL 92* 94* --  CO2 29 31 --  GLUCOSE 132* 63* --  BUN 14 29* --  CREATININE 4.06* 6.09* --  CALCIUM 10.7* 9.89.9 --  MG -- -- 2.0  PHOS 4.2 -- 3.9   Liver Function Tests:  Lab 01/19/11 0640 01/17/11 1655  AST -- 40*  ALT -- 35  ALKPHOS -- 90  BILITOT -- 0.3  PROT -- 8.7*  ALBUMIN 3.7 3.5   No results found for this basename: LIPASE:2,AMYLASE:2 in the last 168 hours No results found for this basename: AMMONIA:2 in the last 168 hours CBC:  Lab 01/19/11 0832 01/19/11 0640 01/17/11 1655  WBC 8.1 8.2 --  NEUTROABS --  -- 6.1  HGB 12.6 13.5 --  HCT 40.3 43.3 --  MCV 84.8 85.4 --  PLT 280 270 --   Cardiac Enzymes:  Lab 01/19/11 0640 01/18/11 2338 01/18/11 1146  CKTOTAL 101 111 134  CKMB 2.4 2.5 2.9  CKMBINDEX -- -- --  TROPONINI 0.52* 0.59* 0.74*   BNP:  Lab 01/17/11 1655  POCBNP 31334.0*   CBG:  Lab 01/19/11 0956 01/18/11 2224 01/18/11 2200 01/18/11 2138 01/18/11 1143 01/18/11 0929  GLUCAP 141* 75 56* 48* 156* 189*   Hemoglobin A1C:  Lab 01/17/11 1655  HGBA1C 10.6*    Micro Results: Recent Results (from the past 240 hour(s))  MRSA PCR SCREENING     Status: Normal   Collection Time   01/17/11  2:39 PM      Component Value Range Status Comment   MRSA by PCR NEGATIVE  NEGATIVE  Final   CULTURE, BLOOD (ROUTINE X 2)     Status: Normal (Preliminary result)   Collection Time   01/17/11  4:35 PM      Component Value Range Status Comment   Specimen Description BLOOD LEFT HAND   Final    Special Requests BOTTLES DRAWN AEROBIC ONLY 10CC   Final    Setup Time [id number]   Final    Culture     Final  Value:        BLOOD CULTURE RECEIVED NO GROWTH TO DATE CULTURE WILL BE HELD FOR 5 DAYS BEFORE ISSUING A FINAL NEGATIVE REPORT   Report Status PENDING   Incomplete   CULTURE, BLOOD (ROUTINE X 2)     Status: Normal (Preliminary result)   Collection Time   01/17/11  4:49 PM      Component Value Range Status Comment   Specimen Description BLOOD LEFT HAND   Final    Special Requests BOTTLES DRAWN AEROBIC ONLY 5CC   Final    Setup Time [id number]   Final    Culture     Final    Value:        BLOOD CULTURE RECEIVED NO GROWTH TO DATE CULTURE WILL BE HELD FOR 5 DAYS BEFORE ISSUING A FINAL NEGATIVE REPORT   Report Status PENDING   Incomplete    Studies/Results: Dg Chest 2 View  01/19/2011  *RADIOLOGY REPORT*  Clinical Data: Shortness of breath, cough.  CHEST - 2 VIEW  Comparison: 01/18/2011  Findings: Patchy left lung opacities are noted in the left lower lung and perihilar regions  concerning for pneumonia.  Improving aeration on the right.  No confluent opacity.  Heart is borderline in size.  No effusions. Prior CABG.  IMPRESSION: Patchy left lung airspace disease concerning for pneumonia.  Borderline heart size.  Original Report Authenticated By: Cyndie Chime, M.D.   X-ray Chest Pa And Lateral  01/18/2011  *RADIOLOGY REPORT*  Clinical Data: Fever and cough  CHEST - 2 VIEW  Comparison: 10/05/2010  Findings: Low lung volumes.  Central vascular congestion and edema. No pleural effusion.  No pneumothorax.  Moderate cardiomegaly.  IMPRESSION: Mild CHF with central vascular congestion and edema.  Original Report Authenticated By: Donavan Burnet, M.D.   Medications: I have reviewed the patient's current medications. Scheduled Meds:    . amLODipine  5 mg Oral QHS  . aspirin  81 mg Oral Daily  . ceFEPime (MAXIPIME) IV  500 mg Intravenous Q24H  . cinacalcet  90 mg Oral Q breakfast  . fenofibrate  160 mg Oral Daily  . gabapentin  400 mg Oral Q0700  . glimepiride  4 mg Oral Q0700  . heparin  5,000 Units Subcutaneous Q8H  . insulin aspart  0-9 Units Subcutaneous TID WC  . insulin glargine  7 Units Subcutaneous QHS  . multivitamin  1 tablet Oral Daily  . pantoprazole  40 mg Oral Q1200  . paricalcitol  10 mcg Intravenous Q M,W,F-HD  . pentafluoroprop-tetrafluoroeth      . polyethylene glycol  17 g Oral Daily  . pregabalin  50 mg Oral QPM  . sevelamer  3,200 mg Oral TID WC  . sevelamer  3,200 mg Oral TID WC  . sodium chloride  3 mL Intravenous Q12H  . vancomycin  1,000 mg Intravenous Q M,W,F-HD  . DISCONTD: amLODipine  5 mg Oral Daily  . DISCONTD: ceFEPime (MAXIPIME) IV  1 g Intravenous Q24H  . DISCONTD: insulin glargine  10 Units Subcutaneous QHS   Continuous Infusions:  PRN Meds:.sodium chloride, acetaminophen, acetaminophen, albuterol, alteplase, benzonatate, calcium carbonate (dosed in mg elemental calcium), docusate sodium, feeding supplement (NEPRO CARB STEADY),  heparin, heparin, HYDROcodone-acetaminophen, ipratropium, lidocaine, lidocaine-prilocaine, pentafluoroprop-tetrafluoroeth, phenol, polyvinyl alcohol, promethazine, sodium chloride, sorbitol  Assessment/Plan: Pleasant 55 y/o woman with extensive co-morbidities including ESRD, diastolic CHF, CAD, and DM2, admitted on 11/26 for cough and sore throat refractory to ceftriaxone and moxifloxacin, and found to have chest pain,  elevated troponin, ST depressions, and pulmonary edema on 11/27. CXR today showing evidence of possible pneumonia.  # respiratory infection  Clinical presentation of cough and sore throat c/w URI. Pt does also endorse body aches that started around the time she developed cough/sore-throat. WBC notably WNL. Vitals stable at present. Max temp of 38.2 on 11/26. Had a low sat of 84% on evening of 11/26, so have put pt on nasal cannula O2 with target of >92%. Today and yesterday got down to 93% on 2L. CXR on 11/28 showed evidence of central vascular congestion and pulmonary edema w/o overt evidence of pneumonia or consolidation. Edema is likely secondary to CHF/ESRD. Repeat CXR on 11/28 showed patchy opacities in left lower lung and perihilar regions concerning for pneumonia. Despite normal WBC and lack of significant fever, new CXR is concerning. Given patient's multiple co-morbidities will want to keep abx on board for at least another day with monitoring for clinical status post relief of pulmonary edema via three days of dialysis. -labs:      *f/u sputum culture and gram stain      *f/u blood culture      *continue following bmet      *strep and legionella ag - cancelled due to olig/a-uria -symptomatic treatments:      *benzonatate antitussive      *phenol spray for sore throat      *ipratropium and albuterol nebs prn for breathing difficulty      *acetaminophen/hydrocodone for pain      *ondansetron for nausea  -oxygen therapy: 2L via nasal cannula, target sat > 92%; attempt to wean  if tolerated -abx:      *vanc (11/26 - present) - pharmacy is monitoring levels, help appreciated      *cefepime (11/26 - present)      *moxifloxacin (~11/23 - 11/26)      *ceftriaxone IM (11/23 one time)  # chest pain One episode of chest pain on morning of 11/27. Reportedly dull pain at mid clavicular line above her L breast, without radiation, and of a duration of a few minutes. EKGs on 11/27 and 11/28 showed ST depressions with T wave inversions in leads V3 and V4. Troponin elevated at 0.74 then, trending down post dialysis to 0.59, 0.52 (CKs WNL). 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 These anginal episodes reportedly resolved after 2003. When she had her 2002 MI she reported the initial pain to be sharp rather than dull, but by the time she got the ED with her husband she felt fine despite undergoing infarction. Last cath in 2011 showing patent grafts but some occlusive disease that was not intervened on. Given atypical history, extensive history with CAD post CABG and PCI, pt is being monitored closely despite being likely secondary to cardiac strain and CHF compounded by ESRD. Cardiology has been consulted and does not believe presentation/EKG to be consistent with NSTEMI. Per [facility] notes pt is only on ASA 81. Was on clopidogrel and ASA in February and clopidogrel d/c'ed for unclear reasons. Will defer to [facility] re chronic anti-platelet management. -consider heparin gtt if sx/ekg/trop worsen -monitor status post dialysis  # Pulmonary Edema  CXR indicates fluid overload, and pt has h/o difficult fluid balance with dialysis. Please see renal consult note for full details. Renal aware. Pt has been dialyzed on 11/26, 11/27, and 11/28. - appreciate renal assistance  - f/u CXR post dialysis per above  # Back  pain  Likely mechanical in nature, the differential for lower back pain and this patient remains broad, including muscle  skeletal, oncologic, and various other diagnoses. Follow up with PCP unless becomes acutely worse.  - consider plain film if acutely worsens  - PT/OT once improving  - pain control: combination hydrocodone/acetaminophen AND acetaminophen ordered - need to ensure combined acetaminophen dose does not exceed 4 g/day   # ESRD on dialysis  -Patient normally on MWF HD via [facility] -received her normal scheduled dialysis on 11/26 -nephrology consulted and pt received dialysis here on 11/27 (given EKG and elevated troponin), and 11/28 (scheduled)  # Diabetes mellitus type II w/retinopathy, neuropathy, and nephropathy, dx ~age 19 - continue regimen w/glc checks bid. glc has been fluctuating between high values in upper 100's and lows in the 40's. A1C from 11/26 was 10.6, on 11/7 a1c was 8.7. - glargine lowered from 10 to 7 units at bedtime - SSI - aspart  - cont glimeperide with reduced dose of 2 mg daily (from 4 mg) - f/u with PCP re optimizing regimen given A1C  - con't gabapentin  - note that home med list included both gabapentin and pregabalin   # Secondary hyperparathyroidism  - follow bmet  - PTH pending  - continue cinacalcet - Ca mimetic  - continue sevelamer - phosphate binder   # HTN - presently normotensive.  - amlodipine   # HLD - fenofibrate (allergy is noted to lipitor)   # s/p stroke with reported residual L side weakness  - ASA 81 mg daily  # Peripheral artery disease - extremities appear well-perfused now   # Chronic constipation  Had an episode of loose BM on ~11/23. Was constipated on 11/27 and received fleets enema then.  # GERD  - con't pantoprazole   # depression - f/u w/PCP   # DVT prophylaxis  - heparin 5,000 daily subcut  # FEN  -renal diet  -multivitamin daily  # dispo: assuming can be weaned off O2, upper respiratory symptoms improve, followup CXR is not concerning, anticipate d/c back to [facility] ~11/31  # full code - verified on 11/27  -social work  consult for discussion of advance directives    LOS: 2 days   Alvino Chapel 01/19/2011, 2:19 PM   Agree with Ms3 assessment and plan.  Please see my full note for extended PE and A/P.

## 2011-01-19 NOTE — Progress Notes (Signed)
Subjective: CP yesterday, seen by cardiology after trop elevation and poss EKG changes.  No elevation in CK/CKMB, so no NSTEMI.  Appreicated cards input.  HD yesterday and again today.  Objective: Vital signs in last 24 hours: Filed Vitals:   01/19/11 1130 01/19/11 1150 01/19/11 1200 01/19/11 1217  BP: 88/51 100/52 108/52 112/53  Pulse: 82 83 93 93  Temp:    98 F (36.7 C)  TempSrc:    Oral  Resp:    17  Weight:    160 lb 15 oz (73 kg)  SpO2: 100% 100% 100% 100%   Weight change:   Intake/Output Summary (Last 24 hours) at 01/19/11 1443 Last data filed at 01/19/11 1212  Gross per 24 hour  Intake    120 ml  Output   5213 ml  Net  -5093 ml   Physical Exam: GEN: NAD. Alert and oriented x 3. Pleasant, conversant, and cooperative to exam.  RESP: Scattered crackles in the bases, diffuse rhonchi in bilateral lung fields.  CARDIOVASCULAR: RRR, S1, S2, 3/6 HSM @ L sternal border  ABDOMEN: soft, NT/ND, NABS  EXT: warm and dry. No edema in b/l LE  Lab Results: Basic Metabolic Panel:  Lab 01/19/11 1610 01/18/11 0540 01/17/11 1655  NA 139 137 --  K 3.7 3.9 --  CL 92* 94* --  CO2 29 31 --  GLUCOSE 132* 63* --  BUN 14 29* --  CREATININE 4.06* 6.09* --  CALCIUM 10.7* 9.89.9 --  MG -- -- 2.0  PHOS 4.2 -- 3.9   Liver Function Tests:  Lab 01/19/11 0640 01/17/11 1655  AST -- 40*  ALT -- 35  ALKPHOS -- 90  BILITOT -- 0.3  PROT -- 8.7*  ALBUMIN 3.7 3.5   CBC:  Lab 01/19/11 0832 01/19/11 0640 01/17/11 1655  WBC 8.1 8.2 --  NEUTROABS -- -- 6.1  HGB 12.6 13.5 --  HCT 40.3 43.3 --  MCV 84.8 85.4 --  PLT 280 270 --   Cardiac Enzymes:  Lab 01/19/11 0640 01/18/11 2338 01/18/11 1146  CKTOTAL 101 111 134  CKMB 2.4 2.5 2.9  CKMBINDEX -- -- --  TROPONINI 0.52* 0.59* 0.74*   BNP:  Lab 01/17/11 1655  POCBNP 31334.0*   D-Dimer: No results found for this basename: DDIMER:2 in the last 168 hours CBG:  Lab 01/19/11 0956 01/18/11 2224 01/18/11 2200 01/18/11 2138 01/18/11  1143 01/18/11 0929  GLUCAP 141* 75 56* 48* 156* 189*   Hemoglobin A1C:  Lab 01/17/11 1655  HGBA1C 10.6*    Micro Results: Recent Results (from the past 240 hour(s))  MRSA PCR SCREENING     Status: Normal   Collection Time   01/17/11  2:39 PM      Component Value Range Status Comment   MRSA by PCR NEGATIVE  NEGATIVE  Final   CULTURE, BLOOD (ROUTINE X 2)     Status: Normal (Preliminary result)   Collection Time   01/17/11  4:35 PM      Component Value Range Status Comment   Specimen Description BLOOD LEFT HAND   Final    Special Requests BOTTLES DRAWN AEROBIC ONLY 10CC   Final    Setup Time [id number]   Final    Culture     Final    Value:        BLOOD CULTURE RECEIVED NO GROWTH TO DATE CULTURE WILL BE HELD FOR 5 DAYS BEFORE ISSUING A FINAL NEGATIVE REPORT   Report Status PENDING   Incomplete  CULTURE, BLOOD (ROUTINE X 2)     Status: Normal (Preliminary result)   Collection Time   01/17/11  4:49 PM      Component Value Range Status Comment   Specimen Description BLOOD LEFT HAND   Final    Special Requests BOTTLES DRAWN AEROBIC ONLY 5CC   Final    Setup Time [id number]   Final    Culture     Final    Value:        BLOOD CULTURE RECEIVED NO GROWTH TO DATE CULTURE WILL BE HELD FOR 5 DAYS BEFORE ISSUING A FINAL NEGATIVE REPORT   Report Status PENDING   Incomplete    Studies/Results: Dg Chest 2 View  01/19/2011  *RADIOLOGY REPORT*  Clinical Data: Shortness of breath, cough.  CHEST - 2 VIEW  Comparison: 01/18/2011  Findings: Patchy left lung opacities are noted in the left lower lung and perihilar regions concerning for pneumonia.  Improving aeration on the right.  No confluent opacity.  Heart is borderline in size.  No effusions. Prior CABG.  IMPRESSION: Patchy left lung airspace disease concerning for pneumonia.  Borderline heart size.  Original Report Authenticated By: Cyndie Chime, M.D.   X-ray Chest Pa And Lateral  01/18/2011  *RADIOLOGY REPORT*  Clinical Data: Fever  and cough  CHEST - 2 VIEW  Comparison: 10/05/2010  Findings: Low lung volumes.  Central vascular congestion and edema. No pleural effusion.  No pneumothorax.  Moderate cardiomegaly.  IMPRESSION: Mild CHF with central vascular congestion and edema.  Original Report Authenticated By: Donavan Burnet, M.D.   Medications: I have reviewed the patient's current medications. Scheduled Meds:   . amLODipine  5 mg Oral QHS  . aspirin  81 mg Oral Daily  . ceFEPime (MAXIPIME) IV  500 mg Intravenous Q24H  . cinacalcet  90 mg Oral Q breakfast  . fenofibrate  160 mg Oral Daily  . gabapentin  400 mg Oral Q0700  . glimepiride  4 mg Oral Q0700  . heparin  5,000 Units Subcutaneous Q8H  . insulin aspart  0-9 Units Subcutaneous TID WC  . insulin glargine  7 Units Subcutaneous QHS  . multivitamin  1 tablet Oral Daily  . pantoprazole  40 mg Oral Q1200  . paricalcitol  10 mcg Intravenous Q M,W,F-HD  . pentafluoroprop-tetrafluoroeth      . polyethylene glycol  17 g Oral Daily  . pregabalin  50 mg Oral QPM  . sevelamer  3,200 mg Oral TID WC  . sevelamer  3,200 mg Oral TID WC  . sodium chloride  3 mL Intravenous Q12H  . vancomycin  1,000 mg Intravenous Q M,W,F-HD  . DISCONTD: amLODipine  5 mg Oral Daily  . DISCONTD: ceFEPime (MAXIPIME) IV  1 g Intravenous Q24H  . DISCONTD: insulin glargine  10 Units Subcutaneous QHS   Continuous Infusions:  PRN Meds:.sodium chloride, acetaminophen, acetaminophen, albuterol, alteplase, benzonatate, calcium carbonate (dosed in mg elemental calcium), docusate sodium, feeding supplement (NEPRO CARB STEADY), heparin, heparin, HYDROcodone-acetaminophen, ipratropium, lidocaine, lidocaine-prilocaine, pentafluoroprop-tetrafluoroeth, phenol, polyvinyl alcohol, promethazine, sodium chloride, sorbitol  Assessment/Plan: # Cough/Hoarse Throat  Likely URI over PNA given CXR findings.  Will complete 7 days of abx with d/c tomorrow, as our suspicion of HCAP or "failed abx" PNA is very low.   Cough will con't to resolve with time and HD. - Sputum culture  - Blood culture  - con't Vancomycin  - con' t Cefepime  - Nebs when necessary  - Zofran   #Chest Pain  Pt with one episode of CP yesterday, troponin .74-->.5.  Cardiology consulted, agreed that no NSTEMI.  Will con't with medical management.  Per [facility] notes, not on plavix, ASA only.  Was on plavix in feb, now off.  Unclear of reasoning.  Since [facility] is her chronic providers, will defer to their management and decision making in terms of plavix, so will hold off for now. - appreciate cardiology following this pt with extensive history  - dialysis to remove fluid and possible heart strain   # Pulmonary Edema  CXR indicates fluid overload, and pt has h/o difficult fluid balance with dialysis. Please see renal notes for full details.  Got HD yest and today, with f/u CXR in a.m.  Pt BP con't to be stable, breathing well. - appreciate renal assistance  - f/u CXR post dialysis   # Back pain  Likely mechanical in nature, the differential for lower back pain and this patient remains broad, including muscle skeletal, oncologic, and various other diagnoses. - PT/OT once improving  - pain control   # Diabetes mellitus type II w/retinopathy and neuropathy  Poorly controlled, a1c now 10.6 May be affected by prednisone as outpt, but prior a1c also 8.7. Volatile CBG's both in a.m. And pm. - lantus 7 units qhs  - SSI  - con't gabapentin  - reduce glimeperide to 2mg   # HTN  - norvasc qhs   # Chronic constipation  Pt felt constipated today and asked for enema  - fleets enema PRN  # GERD  - con't pantoprazole   #PPx  Lovenox    LOS: 2 days   WILDMAN-TOBRINER, BEN 01/19/2011, 2:43 PM

## 2011-01-19 NOTE — Progress Notes (Signed)
Inpatient Diabetes Program Recommendations  AACE/ADA: New Consensus Statement on Inpatient Glycemic Control (2009)  Target Ranges:  Prepandial:   less than 140 mg/dL      Peak postprandial:   less than 180 mg/dL (1-2 hours)      Critically ill patients:  140 - 180 mg/dL   CBGs 16/10: 96/ 045/ 48 mg/dl  Inpatient Diabetes Program Recommendations Insulin - Basal: Noted Lantus decreased to 7 units QHS last pm. Oral Agents: May need to reduce Amaryl to 2mg  daily if pt continues to have hypoglycemia.  No CBG check for 5pm yesterday, however CBG at bedtime last night was low.

## 2011-01-19 NOTE — Progress Notes (Signed)
Physical Therapy Treatment Patient Details Name: Faith [name] MRN: [id number] DOB: [birth year] Today's Date: 01/19/2011  PT Assessment/Plan  PT - Assessment/Plan Comments on Treatment Session: pt reporting no need for continued PT. I agree with pt.  No further acute PT needed.   PT Plan: All goals met and education completed, patient dischaged from PT services Follow Up Recommendations: Outpatient PT Equipment Recommended: None recommended by PT PT Goals  Acute Rehab PT Goals Pt will go Supine/Side to Sit: with modified independence PT Goal: Supine/Side to Sit - Progress: Met Pt will go Sit to Supine/Side: with modified independence PT Goal: Sit to Supine/Side - Progress: Met Pt will Transfer Sit to Stand/Stand to Sit: with modified independence PT Transfer Goal: Sit to Stand/Stand to Sit - Progress: Met Pt will Ambulate: >150 feet;with modified independence;with least restrictive assistive device PT Goal: Ambulate - Progress: Met  PT Treatment Precautions/Restrictions  Restrictions Weight Bearing Restrictions: No Mobility (including Balance) Bed Mobility Bed Mobility: No Transfers Transfers: Yes Sit to Stand: 6: Modified independent (Device/Increase time);With upper extremity assist;With armrests;From chair/3-in-1 Stand to Sit: 6: Modified independent (Device/Increase time);With upper extremity assist;To chair/3-in-1 Ambulation/Gait Ambulation/Gait: Yes Ambulation/Gait Assistance: 6: Modified independent (Device/Increase time) Ambulation Distance (Feet): 100 Feet Assistive device: Rolling walker Gait Pattern: Within Functional Limits Stairs: No Wheelchair Mobility Wheelchair Mobility: No  Posture/Postural Control Posture/Postural Control: No significant limitations Balance Balance Assessed: No Exercise    End of Session PT - End of Session Equipment Utilized During Treatment: Gait belt Activity Tolerance: Patient tolerated treatment well Patient left: in  chair;with call bell in reach Nurse Communication: Mobility status for ambulation General Behavior During Session: [facility] for tasks performed Cognition: [facility] for tasks performed  Faith [name] L. Amiri Tritch DPT [phone] 01/19/2011, 5:00 PM

## 2011-01-19 NOTE — Progress Notes (Signed)
Patient is being discharged from PT services secondary to:  Goals met and no further therapy needs identified.  Please see latest Therapy Progress Note for current level of functioning and progress toward goals.  Progress and discharge plan and discussed with patient/caregiver and they Agree.  Juriel Cid L. Armandina Iman DPT [phone] 01/19/2011

## 2011-01-19 NOTE — Progress Notes (Signed)
Internal Medicine Teaching Service Attending Note Date: 01/19/2011  Patient name: Faith [name]  Medical record number: [id number]  Date of birth: [birth year]    This patient has been seen and discussed with the house staff. Please see their note for complete details. I concur with their findings. Pt was taken to HD yesterday and today for her regular session. Increased time on HD and lowered dry weight. Sxs improved. Appreciate cards assistance. If stable, D/C to home and [facility] tomorrow.  BUTCHER,ELIZABETH 01/19/2011, 4:53 PM

## 2011-01-19 NOTE — Progress Notes (Signed)
Patient seen on HD.  I agree with above note.  .  She says she is beginning to feel better.  BFR 430, BP 107/58  Annie Sable, MD

## 2011-01-20 ENCOUNTER — Other Ambulatory Visit: Payer: Self-pay

## 2011-01-20 LAB — GLUCOSE, CAPILLARY: Glucose-Capillary: 219 mg/dL — ABNORMAL HIGH (ref 70–99)

## 2011-01-20 MED ORDER — INSULIN GLARGINE 100 UNIT/ML ~~LOC~~ SOLN
10.0000 [IU] | Freq: Every day | SUBCUTANEOUS | Status: DC
  Administered 2011-01-20: 10 [IU] via SUBCUTANEOUS

## 2011-01-20 MED ORDER — NYSTATIN 100000 UNIT/ML MT SUSP
5.0000 mL | Freq: Three times a day (TID) | OROMUCOSAL | Status: DC
  Administered 2011-01-20 – 2011-01-21 (×4): 500000 [IU] via ORAL
  Filled 2011-01-20 (×5): qty 5

## 2011-01-20 MED ORDER — INSULIN ASPART 100 UNIT/ML ~~LOC~~ SOLN
10.0000 [IU] | Freq: Once | SUBCUTANEOUS | Status: AC
  Administered 2011-01-20: 10 [IU] via SUBCUTANEOUS

## 2011-01-20 MED ORDER — HEPARIN SODIUM (PORCINE) 1000 UNIT/ML DIALYSIS
100.0000 [IU]/kg | INTRAMUSCULAR | Status: DC | PRN
  Administered 2011-01-21: 7300 [IU] via INTRAVENOUS_CENTRAL
  Filled 2011-01-20: qty 8

## 2011-01-20 NOTE — Progress Notes (Addendum)
Pleasant 55 y/o woman with extensive co-morbidities including ESRD, diastolic CHF, CAD, and DM2, admitted on 11/26 for cough and sore throat refractory to outpatient ceftriaxone and moxifloxacin, and found to have chest pain, elevated troponin, ST depressions, and pulmonary edema on 11/27. CXR on 11/28 showed evidence of possible LLL pneumonia.   Subjective: -PT and DM counseling yesterday -fatigue and HA post dialysis yesterday, better this morning -denies chest pain -improved appetite  Objective: Vital signs in last 24 hours: Filed Vitals:   01/19/11 1800 01/19/11 2130 01/20/11 0455 01/20/11 0502  BP: 99/60 98/60 104/68   Pulse: 83 84 84   Temp: 98 F (36.7 C) 98.5 F (36.9 C) 98 F (36.7 C)   TempSrc: Oral Oral Oral   Resp: 18 20 19    Height:    5\' 4"  (1.626 m)  Weight:  72.6 kg (160 lb 0.9 oz)    SpO2: 100% 100% 94%    Weight change: -4.4 kg (-9 lb 11.2 oz)  Intake/Output Summary (Last 24 hours) at 01/20/11 0909 Last data filed at 01/20/11 0600  Gross per 24 hour  Intake    650 ml  Output   1894 ml  Net  -1244 ml   Physical Exam: Gen: pleasant, resting comfortably in bed in NAD, coughing much less vs. yesterday, still speaking in whisper eyes: pupils equal and round, minimally reactive, muddy/yellow sclera, gray ring c/w arcus senilis  OP: MMM, tongue appears white-coated  CV: regular rhythm, no murmurs appreciated though obscured by lung sounds and rapid shallow breathing Resp: course breath sounds auscultated anteriorly bilaterally, course breath sounds also appreciated in bilateral lower lung fields posteriorly primarily on expiration Abd: +BS  Lab Results: Basic Metabolic Panel:  Lab 01/19/11 0865 01/18/11 0540 01/17/11 1655  NA 139 137 --  K 3.7 3.9 --  CL 92* 94* --  CO2 29 31 --  GLUCOSE 132* 63* --  BUN 14 29* --  CREATININE 4.06* 6.09* --  CALCIUM 10.7* 9.89.9 --  MG -- -- 2.0  PHOS 4.2 -- 3.9   Liver Function Tests:  Lab 01/19/11 0640 01/17/11  1655  AST -- 40*  ALT -- 35  ALKPHOS -- 90  BILITOT -- 0.3  PROT -- 8.7*  ALBUMIN 3.7 3.5   No results found for this basename: LIPASE:2,AMYLASE:2 in the last 168 hours No results found for this basename: AMMONIA:2 in the last 168 hours CBC:  Lab 01/19/11 0832 01/19/11 0640 01/17/11 1655  WBC 8.1 8.2 --  NEUTROABS -- -- 6.1  HGB 12.6 13.5 --  HCT 40.3 43.3 --  MCV 84.8 85.4 --  PLT 280 270 --   Cardiac Enzymes:  Lab 01/19/11 0640 01/18/11 2338 01/18/11 1146  CKTOTAL 101 111 134  CKMB 2.4 2.5 2.9  CKMBINDEX -- -- --  TROPONINI 0.52* 0.59* 0.74*   BNP:  Lab 01/17/11 1655  POCBNP 31334.0*   CBG:  Lab 01/19/11 1301 01/19/11 0956 01/18/11 2224 01/18/11 2200 01/18/11 2138 01/18/11 1143  GLUCAP 136* 141* 75 56* 48* 156*   Hemoglobin A1C:  Lab 01/17/11 1655  HGBA1C 10.6*    Micro Results: Recent Results (from the past 240 hour(s))  MRSA PCR SCREENING     Status: Normal   Collection Time   01/17/11  2:39 PM      Component Value Range Status Comment   MRSA by PCR NEGATIVE  NEGATIVE  Final   CULTURE, BLOOD (ROUTINE X 2)     Status: Normal (Preliminary result)   Collection  Time   01/17/11  4:35 PM      Component Value Range Status Comment   Specimen Description BLOOD LEFT HAND   Final    Special Requests BOTTLES DRAWN AEROBIC ONLY 10CC   Final    Setup Time [id number]   Final    Culture     Final    Value:        BLOOD CULTURE RECEIVED NO GROWTH TO DATE CULTURE WILL BE HELD FOR 5 DAYS BEFORE ISSUING A FINAL NEGATIVE REPORT   Report Status PENDING   Incomplete   CULTURE, BLOOD (ROUTINE X 2)     Status: Normal (Preliminary result)   Collection Time   01/17/11  4:49 PM      Component Value Range Status Comment   Specimen Description BLOOD LEFT HAND   Final    Special Requests BOTTLES DRAWN AEROBIC ONLY 5CC   Final    Setup Time [id number]   Final    Culture     Final    Value:        BLOOD CULTURE RECEIVED NO GROWTH TO DATE CULTURE WILL BE HELD FOR 5 DAYS  BEFORE ISSUING A FINAL NEGATIVE REPORT   Report Status PENDING   Incomplete    Studies/Results: Dg Chest 2 View  01/19/2011  *RADIOLOGY REPORT*  Clinical Data: Shortness of breath, cough.  CHEST - 2 VIEW  Comparison: 01/18/2011  Findings: Patchy left lung opacities are noted in the left lower lung and perihilar regions concerning for pneumonia.  Improving aeration on the right.  No confluent opacity.  Heart is borderline in size.  No effusions. Prior CABG.  IMPRESSION: Patchy left lung airspace disease concerning for pneumonia.  Borderline heart size.  Original Report Authenticated By: Cyndie Chime, M.D.   Medications: I have reviewed the patient's current medications. Scheduled Meds:   . amLODipine  5 mg Oral QHS  . antiseptic oral rinse  15 mL Mouth Rinse BID  . aspirin  81 mg Oral Daily  . ceFEPime (MAXIPIME) IV  500 mg Intravenous Q24H  . cinacalcet  90 mg Oral Q breakfast  . fenofibrate  160 mg Oral Daily  . gabapentin  400 mg Oral Q0700  . glimepiride  2 mg Oral Q0700  . heparin  5,000 Units Subcutaneous Q8H  . insulin aspart  0-9 Units Subcutaneous TID WC  . insulin glargine  7 Units Subcutaneous QHS  . multivitamin  1 tablet Oral Daily  . pantoprazole  40 mg Oral Q1200  . paricalcitol  10 mcg Intravenous Q M,W,F-HD  . polyethylene glycol  17 g Oral Daily  . pregabalin  50 mg Oral QPM  . sevelamer  3,200 mg Oral TID WC  . sevelamer  3,200 mg Oral TID WC  . sodium chloride  3 mL Intravenous Q12H  . vancomycin  1,000 mg Intravenous Q M,W,F-HD  . DISCONTD: glimepiride  4 mg Oral Q0700   Continuous Infusions:  PRN Meds:.sodium chloride, acetaminophen, acetaminophen, albuterol, benzonatate, calcium carbonate (dosed in mg elemental calcium), docusate sodium, feeding supplement (NEPRO CARB STEADY), heparin, heparin, HYDROcodone-acetaminophen, ipratropium, lidocaine, lidocaine-prilocaine, pentafluoroprop-tetrafluoroeth, phenol, polyvinyl alcohol, promethazine, sodium chloride,  sorbitol Assessment/Plan: Pleasant 55 y/o woman with extensive co-morbidities including ESRD, diastolic CHF, CAD, and DM2, admitted on 11/26 for cough and sore throat refractory to outpatient ceftriaxone and moxifloxacin, and found to have chest pain, elevated troponin, ST depressions, and pulmonary edema on 11/27. CXR on 11/28 showed evidence of possible LLL pneumonia.   # respiratory infection  Clinical presentation of cough and sore throat c/w URI. Pt does also endorse body aches that started around the time she developed cough/sore-throat. WBC notably WNL. Pt was staph aureus positive via Xpert nasal assay in May, 2012. Vitals stable at present. Max temp of 38.2 on 11/26. Had a low sat of 84% on evening of 11/26, so have put pt on nasal cannula O2 with target of >92%. Generally sat in upper 90's on 2L. CXR on 11/28 showed evidence of central vascular congestion and pulmonary edema w/o overt evidence of pneumonia or consolidation. Edema is likely secondary to CHF/ESRD. Repeat CXR on 11/28 showed patchy opacities in left lower lung and perihilar regions concerning for pneumonia. Despite normal WBC and lack of significant fever, new CXR is concerning given patient's multiple co-morbidities. Will therefore want to continue abx for completion of a 10 day course ending 12/3. Per nephrology's recommendation, will need to switch cefepime to an oral alternative re out-of-hospital abx therapy. Will consult pharm re oral hospital-associated pneumonia coverage in the context of ESRD. -pharm consult re oral ESRD-friendly alternatives to cefepime -labs:      *f/u sputum culture and gram stain - no data?     *f/u blood culture - NGTD     *continue following bmet  -symptomatic treatments:      *benzonatate antitussive      *phenol spray for sore throat      *ipratropium and albuterol nebs prn for breathing difficulty      *acetaminophen/hydrocodone for pain      *ondansetron for nausea  -oxygen therapy: 2L via  nasal cannula, target sat > 92%; attempt to wean if tolerated  -abx:      *cefepime (11/26 - est end 11/30) - need oral regimen alternative     *vanc (11/26 - est completion 12/3) - pharmacy is monitoring levels, help appreciated      *moxifloxacin (~11/23 - 11/26)      *ceftriaxone IM (11/23 one time)   # chest pain  One episode of chest pain on morning of 11/27. Reportedly dull pain at mid clavicular line above her L breast, without radiation, and of a duration of a few minutes. EKGs on 11/27 and 11/28 showed ST depressions with T wave inversions in leads V3 and V4. Troponin elevated at 0.74 then, trending down post dialysis to 0.59, 0.52 (CKs WNL). 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 These anginal episodes reportedly resolved after 2003. When she had her 2002 MI she reported the initial pain to be sharp rather than dull, but by the time she got the ED with her husband she felt fine despite undergoing infarction. Last cath in 2011 showing patent grafts but some occlusive disease that was not intervened on. Given atypical history, extensive history with CAD post CABG and PCI, pt is being monitored closely despite being likely secondary to cardiac strain and CHF compounded by ESRD. Cardiology has been consulted and does not believe presentation/EKG to be consistent with NSTEMI given N CK and MB. Per [facility] notes pt is only on ASA 81. Was on clopidogrel and ASA in February, and clopidogrel was d/c'ed for unclear reasons. Will defer to [facility] re chronic anti-platelet management.  -consider heparin gtt if sx/ekg/trop worsen  -monitor status post dialysis  -cards consulted, help appreciated  # Pulmonary Edema  CXR indicates fluid overload, and pt has h/o difficult fluid balance with dialysis. Please see renal consult note for  full details. Renal aware. Pt has been dialyzed on 11/26, 11/27, and 11/28. - f/u CXR post dialysis per above     # Back pain  Likely mechanical in nature, the differential for lower back pain and this patient remains broad, including muscle skeletal, oncologic, and various other diagnoses. Follow up with PCP unless becomes acutely worse.  - consider plain film if acutely worsens  - PT/OT once improving  - pain control: combination hydrocodone/acetaminophen AND acetaminophen ordered - need to ensure combined acetaminophen dose does not exceed 4 g/day   # ESRD on dialysis -Patient normally on MWF HD via [facility]  -received her normal scheduled dialysis pre-admit on 11/26 -pt received dialysis here on 11/27 (given EKG and elevated troponin), and 11/28 (scheduled) -nephrology consulted, assistance appreciated -next dialysis scheduled for Friday morning here per nephrology's note  # Diabetes mellitus type II w/retinopathy, neuropathy, and nephropathy, dx ~age 21 - continue regimen w/glc checks bid. glc has been fluctuating between high values in upper 100's and lows in the 40's. A1C from 11/26 was 10.6, on 11/7 a1c was 8.7. Basal insulin has been lowered to try and avoid hypoglycemia. - glargine lowered from 10 to 7 units at bedtime  - SSI - aspart  - cont glimeperide with reduced dose of 2 mg daily (from 4 mg)  - f/u with PCP re optimizing regimen given A1C  - con't gabapentin  - note that home med list included both gabapentin and pregabalin   # Secondary hyperparathyroidism  - follow bmet  - PTH pending  - continue cinacalcet - Ca mimetic  - continue sevelamer - phosphate binder   # HTN - presently normotensive.  - amlodipine   # HLD - fenofibrate (allergy is noted to lipitor)   # s/p stroke with reported residual L side weakness  - ASA 81 mg daily   # Peripheral artery disease - extremities appear well-perfused now   # Chronic constipation  -episode of loose BM on ~11/23. Was constipated on 11/27 and received fleets enema then.   # GERD  - con't pantoprazole   # depression - f/u w/PCP    # DVT prophylaxis  - heparin 5,000 daily subcut   # FEN  -renal diet  -multivitamin daily   # dispo: assuming can be weaned off O2 and respiratory symptoms improve, anticipate d/c back to [facility] ~11/30, with abx being completed @dialysis /[facility]  # full code - verified on 11/27  -social work consulted for discussion of advance directives    LOS: 3 days   Alvino Chapel 01/20/2011, 9:09 AM   Agree c excellent MS3 note.  Please see my note for additional exam findings and a/p details.

## 2011-01-20 NOTE — Progress Notes (Signed)
Subjective: Pt feels well.  No further CP.  Cough much improved.  No SOB.  Eager to go home.  Objective: Vital signs in last 24 hours: Filed Vitals:   01/20/11 0455 01/20/11 0502 01/20/11 0945 01/20/11 1411  BP: 104/68  105/66 119/69  Pulse: 84  78 80  Temp: 98 F (36.7 C)  97.8 F (36.6 C) 97.7 F (36.5 C)  TempSrc: Oral  Oral Oral  Resp: 19  20 20   Height:  5\' 4"  (1.626 m)    Weight:      SpO2: 94%  96% 96%   Weight change: -9 lb 11.2 oz (-4.4 kg)  Intake/Output Summary (Last 24 hours) at 01/20/11 1424 Last data filed at 01/20/11 1300  Gross per 24 hour  Intake   1010 ml  Output      1 ml  Net   1009 ml   Physical Exam: GEN: NAD. Alert and oriented x 3. Pleasant, conversant, and cooperative to exam.  RESP: Scattered crackles in the bases, diffuse rhonchi in bilateral lung fields.  CARDIOVASCULAR: RRR, S1, S2, 3/6 HSM @ L sternal border  ABDOMEN: soft, NT/ND, NABS  EXT: warm and dry. No edema in b/l LE  Lab Results: Basic Metabolic Panel:  Lab 01/19/11 1610 01/18/11 0540 01/17/11 1655  NA 139 137 --  K 3.7 3.9 --  CL 92* 94* --  CO2 29 31 --  GLUCOSE 132* 63* --  BUN 14 29* --  CREATININE 4.06* 6.09* --  CALCIUM 10.7* 9.89.9 --  MG -- -- 2.0  PHOS 4.2 -- 3.9   Liver Function Tests:  Lab 01/19/11 0640 01/17/11 1655  AST -- 40*  ALT -- 35  ALKPHOS -- 90  BILITOT -- 0.3  PROT -- 8.7*  ALBUMIN 3.7 3.5   CBC:  Lab 01/19/11 0832 01/19/11 0640 01/17/11 1655  WBC 8.1 8.2 --  NEUTROABS -- -- 6.1  HGB 12.6 13.5 --  HCT 40.3 43.3 --  MCV 84.8 85.4 --  PLT 280 270 --   Cardiac Enzymes:  Lab 01/19/11 0640 01/18/11 2338 01/18/11 1146  CKTOTAL 101 111 134  CKMB 2.4 2.5 2.9  CKMBINDEX -- -- --  TROPONINI 0.52* 0.59* 0.74*   BNP:  Lab 01/17/11 1655  POCBNP 31334.0*   CBG:  Lab 01/20/11 1216 01/20/11 0757 01/20/11 0455 01/19/11 2135 01/19/11 1652 01/19/11 1301  GLUCAP 393* 264* 245* 219* 119* 136*   Hemoglobin A1C:  Lab 01/17/11 1655  HGBA1C  10.6*    Micro Results: Recent Results (from the past 240 hour(s))  MRSA PCR SCREENING     Status: Normal   Collection Time   01/17/11  2:39 PM      Component Value Range Status Comment   MRSA by PCR NEGATIVE  NEGATIVE  Final   CULTURE, BLOOD (ROUTINE X 2)     Status: Normal (Preliminary result)   Collection Time   01/17/11  4:35 PM      Component Value Range Status Comment   Specimen Description BLOOD LEFT HAND   Final    Special Requests BOTTLES DRAWN AEROBIC ONLY 10CC   Final    Setup Time [id number]   Final    Culture     Final    Value:        BLOOD CULTURE RECEIVED NO GROWTH TO DATE CULTURE WILL BE HELD FOR 5 DAYS BEFORE ISSUING A FINAL NEGATIVE REPORT   Report Status PENDING   Incomplete   CULTURE, BLOOD (ROUTINE X 2)  Status: Normal (Preliminary result)   Collection Time   01/17/11  4:49 PM      Component Value Range Status Comment   Specimen Description BLOOD LEFT HAND   Final    Special Requests BOTTLES DRAWN AEROBIC ONLY 5CC   Final    Setup Time [id number]   Final    Culture     Final    Value:        BLOOD CULTURE RECEIVED NO GROWTH TO DATE CULTURE WILL BE HELD FOR 5 DAYS BEFORE ISSUING A FINAL NEGATIVE REPORT   Report Status PENDING   Incomplete    Studies/Results: Dg Chest 2 View  01/19/2011  *RADIOLOGY REPORT*  Clinical Data: Shortness of breath, cough.  CHEST - 2 VIEW  Comparison: 01/18/2011  Findings: Patchy left lung opacities are noted in the left lower lung and perihilar regions concerning for pneumonia.  Improving aeration on the right.  No confluent opacity.  Heart is borderline in size.  No effusions. Prior CABG.  IMPRESSION: Patchy left lung airspace disease concerning for pneumonia.  Borderline heart size.  Original Report Authenticated By: Cyndie Chime, M.D.   Medications: I have reviewed the patient's current medications. Scheduled Meds:   . amLODipine  5 mg Oral QHS  . antiseptic oral rinse  15 mL Mouth Rinse BID  . aspirin  81 mg Oral  Daily  . ceFEPime (MAXIPIME) IV  500 mg Intravenous Q24H  . cinacalcet  90 mg Oral Q breakfast  . fenofibrate  160 mg Oral Daily  . gabapentin  400 mg Oral Q0700  . glimepiride  2 mg Oral Q0700  . heparin  5,000 Units Subcutaneous Q8H  . insulin aspart  0-9 Units Subcutaneous TID WC  . insulin glargine  7 Units Subcutaneous QHS  . multivitamin  1 tablet Oral Daily  . pantoprazole  40 mg Oral Q1200  . paricalcitol  10 mcg Intravenous Q M,W,F-HD  . polyethylene glycol  17 g Oral Daily  . pregabalin  50 mg Oral QPM  . sevelamer  3,200 mg Oral TID WC  . sevelamer  3,200 mg Oral TID WC  . sodium chloride  3 mL Intravenous Q12H  . vancomycin  1,000 mg Intravenous Q M,W,F-HD  . DISCONTD: glimepiride  4 mg Oral Q0700   Continuous Infusions:  PRN Meds:.sodium chloride, acetaminophen, acetaminophen, albuterol, benzonatate, calcium carbonate (dosed in mg elemental calcium), docusate sodium, feeding supplement (NEPRO CARB STEADY), heparin, heparin, HYDROcodone-acetaminophen, ipratropium, lidocaine, lidocaine-prilocaine, pentafluoroprop-tetrafluoroeth, phenol, polyvinyl alcohol, promethazine, sodium chloride, sorbitol, DISCONTD: heparin  Assessment/Plan: # Cough/Hoarse Throat  Repeat CXR indicates some infiltrate c/w PNA.  Pt is clinically much improved.  Could be that removal of fluid after HD uncovered infiltrate.  Regardless, pt now on day 4 of vanc and cefepime.  Will con't abx for 10 days with HD.  Instead of cefepime, can consider ceftazidime with HD or levofloxacin PO. - con't Vancomycin  - con' t Cefepime  - Nebs when necessary  - Zofran   #Chest Pain  No further CP. Will con't with medical management. Per [facility] notes, not on plavix, ASA only. Was on plavix in feb, now off. Unclear of reasoning. Since [facility] is her chronic providers, will defer to their management and decision making in terms of plavix, so will hold off for now.  - appreciate  cardiology following this pt with extensive history  - dialysis to remove fluid and possible heart strain   # Pulmonary  Edema  CXR indicates fluid overload much improved.  Per nephrology, will get HD here tomorrow and then good for d/c from their end.  Will plan for d/c after HD tomorrow. - appreciate renal assistance   # Back pain  Likely mechanical in nature, the differential for lower back pain and this patient remains broad, including muscle skeletal, oncologic, and various other diagnoses.  - PT/OT once improving  - pain control   # Diabetes mellitus type II w/retinopathy and neuropathy  Poorly controlled, a1c now 10.6 May be affected by prednisone as outpt, but prior a1c also 8.7. Volatile CBG's both in a.m. And pm.  - lantus 7 units qhs back to 10 units - SSI  - con't gabapentin  - glimeperide 2mg , consider increasing back to 4  # HTN  - norvasc qhs   # Chronic constipation  Pt felt constipated today and asked for enema  - fleets enema PRN   # GERD  - con't pantoprazole   #PPx  Lovenox    LOS: 3 days   WILDMAN-TOBRINER, BEN 01/20/2011, 2:24 PM

## 2011-01-20 NOTE — Progress Notes (Signed)
Internal Medicine Teaching Service Attending Note Date: 01/20/2011  Patient name: Faith [name]  Medical record number: [id number]  Date of birth: 11/01/1955    This patient has been seen and discussed with the house staff. Please see their note for complete details. I concur with their findings with the following additions/corrections:  Pt's repeat CXR shows LLL PNA. Will complete full course of IV abx for HAP. URI sxs improving but still with sore throat and hoarseness. Pt net -3 L yesterday with HD. To be dialyzed tomorrow then D/C'd.   Blanch Media 01/20/2011, 5:10 PM

## 2011-01-20 NOTE — Progress Notes (Signed)
Subjective: Interval History: none. Reports feeling much better wanted to go home today, but told her of the advantages of getting hd done here tomorrow  Objective: Vital signs in last 24 hours: Temp:  [97.3 F (36.3 C)-98.5 F (36.9 C)] 98 F (36.7 C) (11/29 0455) Pulse Rate:  [82-93] 84  (11/29 0455) Resp:  [17-20] 19  (11/29 0455) BP: (81-112)/(46-68) 104/68 mmHg (11/29 0455) SpO2:  [94 %-100 %] 94 % (11/29 0455) Weight:  [72.6 kg (160 lb 0.9 oz)-73 kg (160 lb 15 oz)] 160 lb 0.9 oz (72.6 kg) (11/28 2130) Weight change: -4.4 kg (-9 lb 11.2 oz)  Intake/Output from previous day: 11/28 0701 - 11/29 0700 In: 650 [P.O.:600; IV Piggyback:50] Out: 1894 [Stool:1] Intake/Output this shift:   General: Comfortable  Resp: coarse, diminished bilat; no rales or wheezes  Cardio: S1, S2 normal  GI: soft, non-tender; bowel sounds normal; no masses, no organomegaly  Extremities: extremities normal, atraumatic, no cyanosis or edema  Skin: Skin color, texture, turgor normal. No rashes or lesions  Vascular Access: RUA AVG + T/B   Lab Results:  Basename 01/19/11 0832 01/19/11 0640  WBC 8.1 8.2  HGB 12.6 13.5  HCT 40.3 43.3  PLT 280 270   Renal:  Basename 01/19/11 0640 01/18/11 0540 01/17/11 1655  NA 139 137 --  K 3.7 3.9 --  CL 92* 94* --  CO2 29 31 --  GLUCOSE 132* 63* --  BUN 14 29* --  CREATININE 4.06* 6.09* --  CALCIUM 10.7* 9.89.9 --  PHOS 4.2 -- 3.9  ALBUMIN 3.7 -- 3.5    Basename 01/18/11 0540  PTH 771.4*    Iron Studies: No results found for this basename: IRON,TIBC,TRANSFERRIN,FERRITIN in the last 72 hours  Studies/Results: Dg Chest 2 View  01/19/2011  *RADIOLOGY REPORT*  Clinical Data: Shortness of breath, cough.  CHEST - 2 VIEW  Comparison: 01/18/2011  Findings: Patchy left lung opacities are noted in the left lower lung and perihilar regions concerning for pneumonia.  Improving aeration on the right.  No confluent opacity.  Heart is borderline in size.  No  effusions. Prior CABG.  IMPRESSION: Patchy left lung airspace disease concerning for pneumonia.  Borderline heart size.  Original Report Authenticated By: Cyndie Chime, M.D.    I have reviewed the patient's current medications.  Assessment/Plan: 1. CAP- f/u CXR following fluid removal now identifying LLL pneumonia and improved aeration on right (not identifying previously suspected RLL Pneumonia); remains afebrile on IV Maxipime and Vancomycin . Can give vanc with HD but not maxipime, will need to be changed to something oral at time of d/c  2. Pulmonary Edema-resolved following aggressive HD and volume removal; previous eDW 78.5 (neg f/u CXR for CHF) recommend permanent increased Tx time 3:45 outpt and new eDW 73.5 kg (wt 72.6 today); d/w patient and she agrees.  3. Chest Pain- remains chest pain free; cardiology following  4. ESRD: MWF [facility]. Next tx tomorrow here 5. Hypertension/Volume: controlled with meds and UF  6. 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 DM- CBG's and SSI with teaching services following 9. Disposition- per primary services. Please inform anticipated LOS .  Looks like home tomorrow after HD  01/20/2011,9:37 AM  LOS: 3 days   Samuel Germany, [facility] Pager [phone]  Patient seen and examined, agree with above  note, see revisions Annie Sable, MD 01/20/2011

## 2011-01-21 ENCOUNTER — Inpatient Hospital Stay (HOSPITAL_COMMUNITY): Payer: Medicare (Managed Care)

## 2011-01-21 DIAGNOSIS — I5030 Unspecified diastolic (congestive) heart failure: Secondary | ICD-10-CM

## 2011-01-21 LAB — CBC
HCT: 41.7 % (ref 36.0–46.0)
Hemoglobin: 13.3 g/dL (ref 12.0–15.0)
MCH: 27.1 pg (ref 26.0–34.0)
MCV: 84.9 fL (ref 78.0–100.0)
Platelets: 317 10*3/uL (ref 150–400)
RBC: 4.91 MIL/uL (ref 3.87–5.11)
WBC: 10.4 10*3/uL (ref 4.0–10.5)

## 2011-01-21 LAB — RENAL FUNCTION PANEL
BUN: 36 mg/dL — ABNORMAL HIGH (ref 6–23)
Chloride: 91 mEq/L — ABNORMAL LOW (ref 96–112)
Glucose, Bld: 79 mg/dL (ref 70–99)
Phosphorus: 5 mg/dL — ABNORMAL HIGH (ref 2.3–4.6)
Potassium: 4.2 mEq/L (ref 3.5–5.1)

## 2011-01-21 LAB — GLUCOSE, CAPILLARY
Glucose-Capillary: 94 mg/dL (ref 70–99)
Glucose-Capillary: 173 mg/dL — ABNORMAL HIGH (ref 70–99)

## 2011-01-21 MED ORDER — CEFTAZIDIME 2 G IV SOLR: 2.0000 g | INTRAVENOUS | Status: AC

## 2011-01-21 MED ORDER — PHENOL 1.4 % MT LIQD: 1.0000 | OROMUCOSAL | Status: DC | PRN

## 2011-01-21 MED ORDER — PARICALCITOL 5 MCG/ML IV SOLN
INTRAVENOUS | Status: AC
  Administered 2011-01-21: 09:00:00
  Filled 2011-01-21: qty 2

## 2011-01-21 MED ORDER — VANCOMYCIN HCL IN DEXTROSE 1-5 GM/200ML-% IV SOLN: 1000.0000 mg | INTRAVENOUS | Status: AC

## 2011-01-21 MED ORDER — GLIMEPIRIDE 4 MG PO TABS
4.0000 mg | ORAL_TABLET | ORAL | Status: DC
  Filled 2011-01-21: qty 1

## 2011-01-21 NOTE — Progress Notes (Signed)
Internal Medicine Teaching Service Attending Note Date: 01/21/2011  Patient name: Faith [name]  Medical record number: [id number]  Date of birth: [birth year]    This patient has been seen and discussed with the house staff. Please see their note for complete details. I concur with their findings with the following additions/corrections:  [name] was seen while she had 30 min of HD remaining. Her voice is returning, her sore throat is better, her breathing is better, and she denies HA / lightheadedness. Pt is stable for D/C home with F/U with [facility].  Sheila Ocasio 01/21/2011, 2:06 PM

## 2011-01-21 NOTE — Progress Notes (Signed)
Subjective: No events overnight.  HD this a.m., then d/c.  Objective: Vital signs in last 24 hours: Filed Vitals:   01/21/11 1100 01/21/11 1130 01/21/11 1200 01/21/11 1213  BP: 97/51 99/48 98/49  117/61  Pulse: 81 88 80 81  Temp:    97.3 F (36.3 C)  TempSrc:    Oral  Resp:    18  Height:      Weight:    163 lb 12.8 oz (74.3 kg)  SpO2:    96%   Weight change: -3 lb 4.9 oz (-1.5 kg)  Intake/Output Summary (Last 24 hours) at 01/21/11 1350 Last data filed at 01/21/11 1213  Gross per 24 hour  Intake    650 ml  Output    346 ml  Net    304 ml   Physical Exam: GEN: NAD. Alert and oriented x 3. Pleasant, conversant, and cooperative to exam.  RESP: Scattered coarse breath sounds, much improved from prior. CARDIOVASCULAR: RRR, S1, S2, 3/6 HSM @ L sternal border  ABDOMEN: soft, NT/ND, NABS  EXT: warm and dry. No edema in b/l LE  Lab Results: Basic Metabolic Panel:  Lab 01/21/11 9147 01/19/11 0640 01/17/11 1655  NA 135 139 --  K 4.2 3.7 --  CL 91* 92* --  CO2 29 29 --  GLUCOSE 79 132* --  BUN 36* 14 --  CREATININE 6.40* 4.06* --  CALCIUM 11.4* 10.7* --  MG -- -- 2.0  PHOS 5.0* 4.2 --   Liver Function Tests:  Lab 01/21/11 0645 01/19/11 0640 01/17/11 1655  AST -- -- 40*  ALT -- -- 35  ALKPHOS -- -- 90  BILITOT -- -- 0.3  PROT -- -- 8.7*  ALBUMIN 3.5 3.7 --   CBC:  Lab 01/21/11 0645 01/19/11 0832 01/17/11 1655  WBC 10.4 8.1 --  NEUTROABS -- -- 6.1  HGB 13.3 12.6 --  HCT 41.7 40.3 --  MCV 84.9 84.8 --  PLT 317 280 --   Cardiac Enzymes:  Lab 01/19/11 0640 01/18/11 2338 01/18/11 1146  CKTOTAL 101 111 134  CKMB 2.4 2.5 2.9  CKMBINDEX -- -- --  TROPONINI 0.52* 0.59* 0.74*   BNP:  Lab 01/17/11 1655  POCBNP 31334.0*   CBG:  Lab 01/20/11 2127 01/20/11 1626 01/20/11 1216 01/20/11 0757 01/20/11 0455 01/19/11 2135  GLUCAP 438* 190* 393* 264* 245* 219*   Hemoglobin A1C:  Lab 01/17/11 1655  HGBA1C 10.6*    Micro Results: Recent Results (from the past  240 hour(s))  MRSA PCR SCREENING     Status: Normal   Collection Time   01/17/11  2:39 PM      Component Value Range Status Comment   MRSA by PCR NEGATIVE  NEGATIVE  Final   CULTURE, BLOOD (ROUTINE X 2)     Status: Normal (Preliminary result)   Collection Time   01/17/11  4:35 PM      Component Value Range Status Comment   Specimen Description BLOOD LEFT HAND   Final    Special Requests BOTTLES DRAWN AEROBIC ONLY 10CC   Final    Setup Time [id number]   Final    Culture     Final    Value:        BLOOD CULTURE RECEIVED NO GROWTH TO DATE CULTURE WILL BE HELD FOR 5 DAYS BEFORE ISSUING A FINAL NEGATIVE REPORT   Report Status PENDING   Incomplete   CULTURE, BLOOD (ROUTINE X 2)     Status: Normal (Preliminary result)  Collection Time   01/17/11  4:49 PM      Component Value Range Status Comment   Specimen Description BLOOD LEFT HAND   Final    Special Requests BOTTLES DRAWN AEROBIC ONLY 5CC   Final    Setup Time [id number]   Final    Culture     Final    Value:        BLOOD CULTURE RECEIVED NO GROWTH TO DATE CULTURE WILL BE HELD FOR 5 DAYS BEFORE ISSUING A FINAL NEGATIVE REPORT   Report Status PENDING   Incomplete    Studies/Results: No results found. Medications: I have reviewed the patient's current medications. Scheduled Meds:   . amLODipine  5 mg Oral QHS  . antiseptic oral rinse  15 mL Mouth Rinse BID  . aspirin  81 mg Oral Daily  . ceFEPime (MAXIPIME) IV  500 mg Intravenous Q24H  . cinacalcet  90 mg Oral Q breakfast  . fenofibrate  160 mg Oral Daily  . gabapentin  400 mg Oral Q0700  . glimepiride  2 mg Oral Q0700  . heparin  5,000 Units Subcutaneous Q8H  . insulin aspart  0-9 Units Subcutaneous TID WC  . insulin aspart  10 Units Subcutaneous Once  . insulin glargine  10 Units Subcutaneous QHS  . multivitamin  1 tablet Oral Daily  . nystatin  5 mL Oral TID  . pantoprazole  40 mg Oral Q1200  . paricalcitol      . paricalcitol  10 mcg Intravenous Q M,W,F-HD  .  polyethylene glycol  17 g Oral Daily  . pregabalin  50 mg Oral QPM  . sevelamer  3,200 mg Oral TID WC  . sevelamer  3,200 mg Oral TID WC  . sodium chloride  3 mL Intravenous Q12H  . vancomycin  1,000 mg Intravenous Q M,W,F-HD  . DISCONTD: insulin glargine  7 Units Subcutaneous QHS   Continuous Infusions:  PRN Meds:.sodium chloride, acetaminophen, acetaminophen, albuterol, benzonatate, calcium carbonate (dosed in mg elemental calcium), docusate sodium, feeding supplement (NEPRO CARB STEADY), heparin, heparin, HYDROcodone-acetaminophen, ipratropium, lidocaine, lidocaine-prilocaine, pentafluoroprop-tetrafluoroeth, phenol, polyvinyl alcohol, promethazine, sodium chloride, sorbitol  Assessment/Plan: # Cough/Hoarse Throat  Repeat CXR indicates some infiltrate c/w PNA. Pt is clinically much improved. Could be that removal of fluid after HD uncovered infiltrate. Regardless, pt now on day 5 of vanc and cefepime. Will con't abx for 14 days with HD. Instead of cefepime, will proceed with ceftazidime as outpt - con't Vancomycin  - con' t Cefepime in hospital -  Ceftazidime at HD as outpatient - Nebs when necessary  - Zofran   #Chest Pain  No further CP. Will con't with medical management. Per [facility] notes, not on plavix, ASA only. Was on plavix in feb, now off. Unclear of reasoning. Since [facility] is her chronic providers, will defer to their management and decision making in terms of plavix, so will hold off for now.  - appreciate cardiology following this pt with extensive history  - dialysis to remove fluid and possible heart strain   # Pulmonary Edema  CXR indicates fluid overload much improved. Per nephrology, will get HD here this a.m. and then d/c from their end. - appreciate renal assistance   # Back pain  Likely mechanical in nature, the differential for lower back pain and this patient remains broad, including muscle skeletal, oncologic, and various  other diagnoses.  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 Volatile CBG's both in a.m. And pm.  - lantus 7 units qhs back to 10 units  - SSI  - con't gabapentin  - glimeperide 2mg , increasing back to 4   # HTN  - norvasc qhs   # Chronic constipation  Pt felt constipated today and asked for enema  - fleets enema PRN   # GERD  - con't pantoprazole   #PPx  Lovenox    LOS: 4 days   WILDMAN-TOBRINER, BEN 01/21/2011,   ADDENDUM: Pt was hypotensive with cramps during HD.  Had to receive normal saline.  Per nephrology, may still be able to d/c today if vitals look okay and pt feels well this afternoon.  Most recent set of vitals show good BP 117/60 with good O2 sat.  Will repeat set of vitals and get ambulatory O2 sat to evaluate.

## 2011-01-21 NOTE — Progress Notes (Signed)
Patient's O2 sats were 92% at rest on room air after n/c removed, but patient talking. Patient up to walker and ambulated approximately 49ft with standby assist. Patient states she was slightly short of breath when ambulating. Her O2 sats rechecked and they were 95% on room air and was able to maintain this. B/p 96/55 sitting. Pulse 81 and Respirations 20.

## 2011-01-21 NOTE — Progress Notes (Signed)
Patient discharge instructions reviewed with patient. Both patient and husband verbalized understanding. No acute distress noted. Patient left via wheelchair.

## 2011-01-21 NOTE — Progress Notes (Signed)
Patient was seen on dialysis and the procedure was supervised.  BFR 400  Via AVG. BP is  Low, currently receiving saline.  Hopefully patient will improve to point of being able to be discharged today.    Faith [name] A 01/21/2011

## 2011-01-21 NOTE — Progress Notes (Cosign Needed)
Pleasant 55 y/o woman from [facility] with extensive co-morbidities including ESRD, diastolic CHF, CAD, and DM2, admitted on 11/26 for cough and sore throat refractory to outpatient ceftriaxone and moxifloxacin, and found to have chest pain, elevated troponin, ST depressions, and pulmonary edema on 11/27. CXR on 11/28 showed evidence of possible LLL pneumonia, now on day 5/10 of IV abx to be completed at home after dialysis here today.  Subjective: -sore throat and cough improved -appetite is good -no SOB or chest pain  Objective: Vital signs in last 24 hours: Filed Vitals:   01/20/11 1411 01/20/11 1805 01/20/11 2130 01/21/11 0545  BP: 119/69 104/68 105/62 97/51  Pulse: 80 80 64 72  Temp: 97.7 F (36.5 C) 97.6 F (36.4 C) 97.8 F (36.6 C) 97.3 F (36.3 C)  TempSrc: Oral Oral Oral Oral  Resp: 20 20 20 20   Height:      Weight:   73 kg (160 lb 15 oz)   SpO2: 96% 95% 97% 92%   Weight change: -1.5 kg (-3 lb 4.9 oz)  Intake/Output Summary (Last 24 hours) at 01/21/11 0739 Last data filed at 01/21/11 1610  Gross per 24 hour  Intake   1010 ml  Output      1 ml  Net   1009 ml   Physical Exam: Gen: pleasant, resting comfortably in bed in NAD, cough continuing to improve, more normal voice vs. whisper today compared to yesterday eyes: pupils equal and round, minimally reactive, muddy/yellow sclera, gray ring c/w arcus senilis  OP: MMM, tongue appears white-coated but less so vs. yesterday CV: regular rhythm, no murmurs appreciated Resp: course breath sounds appreciated bilaterally, mild crackles appreciated in lower lung fields; improved vs. yesterday Abd: +BS, abd soft, small ~1 cm hard mass appreciated in LUQ (perhaps related to heparin), tenderness to deep palpation LUQ and RUQ Ext: no lower extremity edema appreciated   Lab Results: Basic Metabolic Panel:  Lab 01/19/11 9604 01/18/11 0540 01/17/11 1655  NA 139 137 --  K 3.7 3.9 --  CL 92* 94* --  CO2 29 31 --  GLUCOSE 132* 63* --    BUN 14 29* --  CREATININE 4.06* 6.09* --  CALCIUM 10.7* 9.8 9.9 --  MG -- -- 2.0  PHOS 4.2 -- 3.9   Liver Function Tests:  Lab 01/19/11 0640 01/17/11 1655  AST -- 40*  ALT -- 35  ALKPHOS -- 90  BILITOT -- 0.3  PROT -- 8.7*  ALBUMIN 3.7 3.5   CBC:  Lab 01/19/11 0832 01/19/11 0640 01/17/11 1655  WBC 8.1 8.2 --  NEUTROABS -- -- 6.1  HGB 12.6 13.5 --  HCT 40.3 43.3 --  MCV 84.8 85.4 --  PLT 280 270 --   Cardiac Enzymes:  Lab 01/19/11 0640 01/18/11 2338 01/18/11 1146  CKTOTAL 101 111 134  CKMB 2.4 2.5 2.9  CKMBINDEX -- -- --  TROPONINI 0.52* 0.59* 0.74*   BNP:  Lab 01/17/11 1655  POCBNP 31334.0*   CBG:  Lab 01/20/11 1216 01/20/11 0757 01/20/11 0455 01/19/11 2135 01/19/11 1652 01/19/11 1301  GLUCAP 393* 264* 245* 219* 119* 136*   Hemoglobin A1C:  Lab 01/17/11 1655  HGBA1C 10.6*    Micro Results: Recent Results (from the past 240 hour(s))  MRSA PCR SCREENING     Status: Normal   Collection Time   01/17/11  2:39 PM      Component Value Range Status Comment   MRSA by PCR NEGATIVE  NEGATIVE  Final   CULTURE, BLOOD (  ROUTINE X 2)     Status: Normal (Preliminary result)   Collection Time   01/17/11  4:35 PM      Component Value Range Status Comment   Specimen Description BLOOD LEFT HAND   Final    Special Requests BOTTLES DRAWN AEROBIC ONLY 10CC   Final    Setup Time [id number]   Final    Culture     Final    Value:        BLOOD CULTURE RECEIVED NO GROWTH TO DATE CULTURE WILL BE HELD FOR 5 DAYS BEFORE ISSUING A FINAL NEGATIVE REPORT   Report Status PENDING   Incomplete   CULTURE, BLOOD (ROUTINE X 2)     Status: Normal (Preliminary result)   Collection Time   01/17/11  4:49 PM      Component Value Range Status Comment   Specimen Description BLOOD LEFT HAND   Final    Special Requests BOTTLES DRAWN AEROBIC ONLY 5CC   Final    Setup Time [id number]   Final    Culture     Final    Value:        BLOOD CULTURE RECEIVED NO GROWTH TO DATE CULTURE WILL BE  HELD FOR 5 DAYS BEFORE ISSUING A FINAL NEGATIVE REPORT   Report Status PENDING   Incomplete    Studies/Results: Dg Chest 2 View  01/19/2011  *RADIOLOGY REPORT*  Clinical Data: Shortness of breath, cough.  CHEST - 2 VIEW  Comparison: 01/18/2011  Findings: Patchy left lung opacities are noted in the left lower lung and perihilar regions concerning for pneumonia.  Improving aeration on the right.  No confluent opacity.  Heart is borderline in size.  No effusions. Prior CABG.  IMPRESSION: Patchy left lung airspace disease concerning for pneumonia.  Borderline heart size.  Original Report Authenticated By: Cyndie Chime, M.D.   Medications: I have reviewed the patient's current medications. Scheduled Meds:    amLODipine  5 mg Oral QHS   antiseptic oral rinse  15 mL Mouth Rinse BID   aspirin  81 mg Oral Daily   ceFEPime (MAXIPIME) IV  500 mg Intravenous Q24H   cinacalcet  90 mg Oral Q breakfast   fenofibrate  160 mg Oral Daily   gabapentin  400 mg Oral Q0700   glimepiride  2 mg Oral Q0700   heparin  5,000 Units Subcutaneous Q8H   insulin aspart  0-9 Units Subcutaneous TID WC   insulin aspart  10 Units Subcutaneous Once   insulin glargine  10 Units Subcutaneous QHS   multivitamin  1 tablet Oral Daily   nystatin  5 mL Oral TID   pantoprazole  40 mg Oral Q1200   paricalcitol  10 mcg Intravenous Q M,W,F-HD   polyethylene glycol  17 g Oral Daily   pregabalin  50 mg Oral QPM   sevelamer  3,200 mg Oral TID WC   sevelamer  3,200 mg Oral TID WC   sodium chloride  3 mL Intravenous Q12H   vancomycin  1,000 mg Intravenous Q M,W,F-HD   DISCONTD: insulin glargine  7 Units Subcutaneous QHS   Continuous Infusions:  PRN Meds:.sodium chloride, acetaminophen, acetaminophen, albuterol, benzonatate, calcium carbonate (dosed in mg elemental calcium), docusate sodium, feeding supplement (NEPRO CARB STEADY), heparin, heparin, HYDROcodone-acetaminophen, ipratropium, lidocaine,  lidocaine-prilocaine, pentafluoroprop-tetrafluoroeth, phenol, polyvinyl alcohol, promethazine, sodium chloride, sorbitol, DISCONTD: heparin  Assessment/Plan: Pleasant 55 [facility]/o woman from [facility] with extensive co-morbidities including ESRD, diastolic CHF, CAD, and DM2, admitted on 11/26 for cough and  sore throat refractory to outpatient ceftriaxone and moxifloxacin, and found to have chest pain, elevated troponin, ST depressions, and pulmonary edema on 11/27. CXR on 11/28 showed evidence of possible LLL pneumonia, now on day 5/10 of IV abx to be completed at home after dialysis here today.  # respiratory infection  Clinical presentation of cough and sore throat c/w URI. Pt does also endorse body aches that started around the time she developed cough/sore-throat. WBC notably WNL. Pt was staph aureus positive via Xpert nasal assay in May, 2012. Vitals stable at present. Max temp of 38.2 on 11/26. Few low 80's sats so has been on 2L nasal cannula. CXR on 11/28 showed evidence of central vascular congestion and pulmonary edema likely secondary to CHF/ESRD, w/o overt evidence of pneumonia or consolidation. Repeat CXR on 11/28 showed patchy opacities in left lower lung and perihilar regions c/w pneumonia. Despite normal WBC and lack of significant fever, new CXR is concerning given patient's multiple co-morbidities. Will therefore continue abx for completion of course on 12/7. Can't do cefepime with outpatient dialysis so switching to ceftaz for completion of abx therapy. -labs:       *f/u blood culture - NGTD  -symptomatic treatments:       *benzonatate antitussive       *phenol spray for sore throat       *ipratropium and albuterol nebs prn for breathing difficulty       *acetaminophen/hydrocodone for pain       *ondansetron for nausea  -oxygen therapy: 2L via nasal cannula, target sat > 92%; attempt to wean if tolerated  -abx:       *vanc (11/26 - est completion 12/3) - 1g IV qHDx3; pharmacy is monitoring  levels, help appreciated      *ceftazidime (start on 12/3 - est end 12/7) 2g IV qHDx3      *cefepime (11/26 - 11/30) - need oral regimen alternative & considering ceftaz vs. levo      *moxifloxacin (~11/23 - 11/26)       *ceftriaxone IM (11/23 one time)   # chest pain  One episode of chest pain on morning of 11/27. Reportedly dull pain at mid clavicular line above her L breast, without radiation, and of a duration of a few minutes. EKGs on 11/27, 11/28, and 11/29 showed ST depressions with T wave inversions in leads V2-V4. Troponin elevated at 0.74 then, trending down post dialysis to 0.59, 0.52 (CKs WNL). 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 These anginal episodes reportedly resolved after 2003. When she had her 2002 MI she reported the initial pain to be sharp rather than dull, but by the time she got the ED with her husband she felt fine despite undergoing infarction. Last cath in 2011 showing patent grafts but some occlusive disease that was not intervened on. Given atypical history, extensive history with CAD post CABG and PCI, pt is being monitored closely despite being likely secondary to cardiac strain and CHF compounded by ESRD. Cardiology has been consulted and does not believe presentation/EKG to be consistent with NSTEMI given N CK and MB. Per [facility] notes pt is only on ASA 81. Was on clopidogrel and ASA in February, and clopidogrel was d/c'ed for unclear reasons. Will defer to [facility] re chronic anti-platelet management.  -consider heparin gtt if sx/ekg/trop worsen  -cards consulted, help appreciated   # Pulmonary Edema  CXR on 11/28 indicated fluid overload, and pt  has h/o difficult fluid balance with dialysis. Please see renal consult note for full details. Breathing improved post multiple days of dialysis.  # Back pain  Likely mechanical in nature, the differential for lower back pain and this patient remains broad,  including muscle skeletal, oncologic, and various other diagnoses. Follow up with PCP unless becomes acutely worse.  - consider plain film if acutely worsens  - PT/OT once improving  - pain control: combination hydrocodone/acetaminophen AND acetaminophen ordered - need to ensure combined acetaminophen dose does not exceed 4 g/day   # ESRD on dialysis  -Patient normally on MWF HD via [facility]  -nephrology consulted, assistance appreciated  -received her normal scheduled dialysis pre-admit on 11/26  -pt received dialysis here on 11/27 (given EKG and elevated troponin), and 11/28 (scheduled)  -last inpatient dialysis scheduled for today  # Diabetes mellitus type II w/retinopathy, neuropathy, and nephropathy, dx ~age 35 - continue regimen w/glc checks bid. glc has been fluctuating between high values in mid-400's (11/29) and lows in the 40's (11/27). A1C from 11/26 was 10.6, on 11/7 a1c was 8.7. Basal insulin was lowered to 7 to try to avoid hypoglycemia, but has been raised back up to 10 given significant hyperglycemia. - glargine 10 at bedtime  - SSI - aspart  - cont glimeperide with reduced dose of 2 mg daily (from 4 mg)  - f/u with PCP re optimizing regimen given A1C  - con't gabapentin  - note that home med list included both gabapentin and pregabalin   # tongue appears white-coated c/w candida -nystatin 500k units 3 times daily  # Secondary hyperparathyroidism  - f/u PCP - continue cinacalcet - Ca mimetic  - continue sevelamer - phosphate binder   # HTN - presently normotensive.  - amlodipine   # HLD - fenofibrate (allergy is noted to lipitor)   # s/p stroke with reported residual L side weakness  - ASA 81 mg daily   # Peripheral artery disease - extremities appear well-perfused now   # Chronic constipation  -episode of loose BM on ~11/23. Was constipated on 11/27 and received fleets enema then.   # GERD  - con't pantoprazole   # depression - f/u w/PCP   # DVT  prophylaxis  - heparin 5,000 daily subcut   # FEN  -renal diet  -multivitamin daily   # dispo: -barring any problems or deterioration today, d/c today post dialysis -abx to be completed @dialysis  -need to coordinate followup with [facility]  # full code - verified on 11/27  -social work consulted for discussion of advance directives   LOS: 4 days   Alvino Chapel 01/21/2011, 7:39 AM

## 2011-01-21 NOTE — Discharge Summary (Signed)
Internal Medicine Teaching [facility] Discharge Note  Name: Faith [name] MRN: [id number] DOB: Oct 10, 1955 55 y.o.  Date of Admission: 01/17/2011  1:44 PM Date of Discharge: 01/21/2011 Attending Physician: No att. providers found  Discharge Diagnosis:  HCAP  Volume Overload  ESRD (end stage renal disease) on dialysis  Diabetes mellitus type II  Secondary hyperparathyroidism  CAD (coronary artery disease) of artery bypass graft  Chronic constipation  GERD (gastroesophageal reflux disease)  Anemia of chronic renal failure  Depression  Neuropathy  Diastolic CHF, chronic  Discharge Medications: Discharge Medication List as of 01/21/2011  6:12 PM    START taking these medications   Details  Ceftazidime (FORTAZ) 2 G SOLR Inject 2 g into the vein every Monday, Wednesday, and Friday with hemodialysis., Starting 01/24/2011, Until Fri 01/28/11, Print    phenol (CHLORASEPTIC) 1.4 % LIQD Use as directed 1 spray in the mouth or throat as needed., Starting 01/21/2011, Until Discontinued, Print    vancomycin (VANCOCIN) 1 GM/200ML SOLN Inject 200 mLs (1,000 mg total) into the vein every Monday, Wednesday, and Friday with hemodialysis., Starting 01/24/2011, Until Fri 01/28/11, Print      CONTINUE these medications which have NOT CHANGED   Details  acetaminophen (TYLENOL ARTHRITIS PAIN) 650 MG CR tablet Take 1,300 mg by mouth every 8 (eight) hours as needed. For pain , Until Discontinued, Historical Med    amLODipine (NORVASC) 5 MG tablet Take 5 mg by mouth daily.  , Until Discontinued, Historical Med    aspirin 81 MG chewable tablet Chew 81 mg by mouth daily.  , Until Discontinued, Historical Med    benzonatate (TESSALON) 200 MG capsule Take 200 mg by mouth 3 (three) times daily as needed. For cough, Until Discontinued, Historical Med    cinacalcet (SENSIPAR) 90 MG tablet Take 90 mg by mouth daily.  , Until Discontinued, Historical Med    fenofibrate micronized (LOFIBRA) 134 MG  capsule Take 134 mg by mouth daily.  , Until Discontinued, Historical Med    gabapentin (NEURONTIN) 400 MG capsule Take 400 mg by mouth every morning.  , Until Discontinued, Historical Med    glimepiride (AMARYL) 4 MG tablet Take 4 mg by mouth every morning.  , Until Discontinued, Historical Med    HYDROcodone-acetaminophen (VICODIN) 5-500 MG per tablet Take 1 tablet by mouth 2 (two) times daily.  , Until Discontinued, Historical Med    insulin glargine (LANTUS) 100 UNIT/ML injection Inject 10 Units into the skin at bedtime.  , Until Discontinued, Historical Med    Menthol, Topical Analgesic, (BIOFREEZE ROLL-ON) 4 % GEL Apply 1 application topically daily as needed. For back pain , Until Discontinued, Historical Med    multivitamin (RENA-VIT) TABS tablet Take 1 tablet by mouth daily.  , Until Discontinued, Historical Med    pantoprazole (PROTONIX) 40 MG tablet Take 40 mg by mouth daily.  , Until Discontinued, Historical Med    polyethylene glycol (MIRALAX / GLYCOLAX) packet Take 17 g by mouth daily.  , Until Discontinued, Historical Med    polyvinyl alcohol (LIQUIFILM TEARS) 1.4 % ophthalmic solution Place 1 drop into both eyes daily as needed. For dryness , Until Discontinued, Historical Med    pregabalin (LYRICA) 50 MG capsule Take 50 mg by mouth every evening.  , Until Discontinued, Historical Med    promethazine (PHENERGAN) 25 MG tablet Take 25 mg by mouth every 6 (six) hours as needed. For nausea , Until Discontinued, Historical Med    sevelamer (RENAGEL) 800 MG tablet Take  3,200 mg by mouth 3 (three) times daily with meals.  , Until Discontinued, Historical Med    sodium phosphates (FLEET) 18-48 GM/100ML solution Take 100 mLs by mouth daily.  , Until Discontinued, Historical Med      STOP taking these medications     moxifloxacin (AVELOX) 400 MG tablet         Disposition and follow-up:   Faith [name] was discharged from [facility] in Stable  condition.  She will followup with her [facility] physician Dr. Dorothe Pea and will continue [facility]. hemodialysis.  Follow-up Appointments: Follow-up Information    Follow up with LITTLE, ALFRED B, MD. (office will call)    Contact information:   [address] [place] [address] [phone]         Discharge Orders    Future Orders Please Complete By Expires   Diet - low sodium heart healthy      Increase activity slowly      Discharge instructions      Comments:   - Your antibiotics will be given in dialysis next Monday, Wednesday, and Friday.      Consultations: Treatment Team:  Sadie Haber, MD  Procedures Performed:  Dg Chest 2 View  01/19/2011  *RADIOLOGY REPORT*  Clinical Data: Shortness of breath, cough.  CHEST - 2 VIEW  Comparison: 01/18/2011  Findings: Patchy left lung opacities are noted in the left lower lung and perihilar regions concerning for pneumonia.  Improving aeration on the right.  No confluent opacity.  Heart is borderline in size.  No effusions. Prior CABG.  IMPRESSION: Patchy left lung airspace disease concerning for pneumonia.  Borderline heart size.  Original Report Authenticated By: Cyndie Chime, M.D.   X-ray Chest Pa And Lateral  01/18/2011  *RADIOLOGY REPORT*  Clinical Data: Fever and cough  CHEST - 2 VIEW  Comparison: 10/05/2010  Findings: Low lung volumes.  Central vascular congestion and edema. No pleural effusion.  No pneumothorax.  Moderate cardiomegaly.  IMPRESSION: Mild CHF with central vascular congestion and edema.  Original Report Authenticated By: Donavan Burnet, M.D.    Admission HPI: Patient is a 55 year old woman who is a [facility] patient with a history of ESRD, CAD, diastolic CHF, and diabetes who presents with a three-week history of cough, rhinorrhea, and sore throat. She presented to her PCP on Friday, who gave her a shot of ceftriaxone and a prescription for moxifloxacin. She did not have a CXR, but was presumed  to have possible RLL pneumonia. The patient revisited to her PCP today, and was not doing any better, including a loss of voice, right ear pain, and nausea/vomiting. The patient says that she's been coughing up clear sputum since Friday, denies fevers or chills. She says that her husband has also been sick with a productive cough, but that he got sick after she did. She's been taking some Vicodin and acetaminophen, but the antibiotics prescribed to her have not been helping that much.  Additionally, the patient endorses roughly 4 months of lower back pain on the left worse than the right, for which her PCP has been prescribing her Vicodin and following.   Hospital Course by problem list: # HCAP Patient presented with cough, hoarse voice, and feeling poorly after failing a short outpatient course of by mouth antibiotics. She also had sputum production, but did not have overt chest x-ray findings of pneumonia. However, given her contact with the health care system and dialysis, she  was admitted for presumptive treatment of HCAP. She was started on vancomycin and cefepime.  She responded quite well to treatments, and was discharged with vancomycin and ceftazidime during dialysis on Monday Wednesday and Friday, 12/3, 5, and 7 for a total of roughly 14 days of antibiotics. Blood cultures obtained on admission showed no growth on day of discharge.   # Volume Overload CXR on 11/28 indicated fluid overload, and pt has h/o difficult fluid balance with dialysis. ProBNP could not be interpreted in the setting of ESRD. Breathing improved over the next few days post multiple days of dialysis.  She did receive an exudative dialysis, and her dialysis time was extended by 30 minutes given her history of difficulty with fluid balance. On the final day of dialysis, the patient did become hypotensive and crampy, and had to be given some normal saline following dialysis.   # Chest pain Pt experienced one episode of chest  pain on morning of 11/27. Reportedly dull pain at mid clavicular line above her L breast, without radiation, and of a duration of a few minutes. EKGs on 11/27, 11/28, and 11/29 showed ST depressions with T wave inversions in leads V2-V4. Troponin elevated at 0.74 then trended down to 0.59, 0.52 (CKs remained WNL).  Last cath in 2011 showing patent grafts but some occlusive disease that was not intervened on.  Cardiology was consulted and did not believe presentation/EKG to be consistent with NSTEMI given normal CK and MB. Per [facility] notes pt is only on ASA 81. Was on clopidogrel and ASA in February, and clopidogrel was d/c'ed for unclear reasons. Will defer to [facility] re chronic anti-platelet management.  # Back pain Likely mechanical in nature, the differential for lower back pain and this patient remains broad, including muscle skeletal, oncologic, and various other diagnoses. Given the non-acute nature workup was not performed during this hospitalization. Pain control achieved via hydrocodone/acetaminophen.  # ESRD on dialysis Pt received her normal MWF dialysis via [facility] on monday pre-admission, then received dialysis here on tuesday (given EKG, elevated troponin), wednesday, and friday. Nephrology was consulted and followed the patient's hospital course.  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 Glucose fluctuated significantly from low 40's on 11/27 to mid 400's on 11/29. After hypoglycemic episodes early after admission basal insulin was reduced from 10 to 7 units and glimeperide was lowered from 4 to 2 mg, but given the higher values later in the week basal insulin was raised back to 10 units.  # Secondary hyperparathyroidism - home regimen continued: cinacalcet and sevelamer  # HTN - pt remained normo/hypotensive during her hospital stay, w/home regimen of amlodipine administered  # HLD - fenofibrate  #  Chronic constipation Episode of loose BM on ~11/23. Was constipated on 11/27 and received fleets enema then.   Discharge Vitals:  BP 96/55  Pulse 77  Temp(Src) 98.2 F (36.8 C) (Oral)  Resp 20  Ht 5\' 4"  (1.626 m)  Wt 163 lb 12.8 oz (74.3 kg)  BMI 28.12 kg/m2  SpO2 92%  Discharge Labs:  Results for orders placed during the hospital encounter of 01/17/11 (from the past 24 hour(s))  GLUCOSE, CAPILLARY     Status: Abnormal   Collection Time   01/20/11  9:27 PM      Component Value Range   Glucose-Capillary 438 (*) 70 - 99 (mg/dL)  RENAL FUNCTION PANEL     Status: Abnormal  Collection Time   01/21/11  6:45 AM      Component Value Range   Sodium 135  135 - 145 (mEq/L)   Potassium 4.2  3.5 - 5.1 (mEq/L)   Chloride 91 (*) 96 - 112 (mEq/L)   CO2 29  19 - 32 (mEq/L)   Glucose, Bld 79  70 - 99 (mg/dL)   BUN 36 (*) 6 - 23 (mg/dL)   Creatinine, Ser 1.61 (*) 0.50 - 1.10 (mg/dL)   Calcium 09.6 (*) 8.4 - 10.5 (mg/dL)   Phosphorus 5.0 (*) 2.3 - 4.6 (mg/dL)   Albumin 3.5  3.5 - 5.2 (g/dL)   GFR calc non Af Amer 7 (*) >90 (mL/min)   GFR calc Af Amer 8 (*) >90 (mL/min)  CBC     Status: Abnormal   Collection Time   01/21/11  6:45 AM      Component Value Range   WBC 10.4  4.0 - 10.5 (K/uL)   RBC 4.91  3.87 - 5.11 (MIL/uL)   Hemoglobin 13.3  12.0 - 15.0 (g/dL)   HCT 04.5  40.9 - 81.1 (%)   MCV 84.9  78.0 - 100.0 (fL)   MCH 27.1  26.0 - 34.0 (pg)   MCHC 31.9  30.0 - 36.0 (g/dL)   RDW 91.4 (*) 78.2 - 15.5 (%)   Platelets 317  150 - 400 (K/uL)  GLUCOSE, CAPILLARY     Status: Normal   Collection Time   01/21/11 12:42 PM      Component Value Range   Glucose-Capillary 94  70 - 99 (mg/dL)  GLUCOSE, CAPILLARY     Status: Abnormal   Collection Time   01/21/11  4:36 PM      Component Value Range   Glucose-Capillary 173 (*) 70 - 99 (mg/dL)    SignedAbner Greenspan, BEN 01/21/2011, 6:46 PM

## 2011-01-21 NOTE — Progress Notes (Signed)
Subjective:  Looking forward to going home.  Still coughing, but loose; only pain is where she is getting injections in abdomen. Seen on HD - currently feeling poorly with low BP, c/o ha and nausea  Objective Vital signs in last 24 hours: Filed Vitals:   01/20/11 1411 01/20/11 1805 01/20/11 2130 01/21/11 0545  BP: 119/69 104/68 105/62 97/51  Pulse: 80 80 64 72  Temp: 97.7 F (36.5 C) 97.6 F (36.4 C) 97.8 F (36.6 C) 97.3 F (36.3 C)  TempSrc: Oral Oral Oral Oral  Resp: 20 20 20 20   Height:      Weight:   73 kg (160 lb 15 oz)   SpO2: 96% 95% 97% 92%   Weight change: -1.5 kg (-3 lb 4.9 oz)  Intake/Output Summary (Last 24 hours) at 01/21/11 0829 Last data filed at 01/21/11 1610  Gross per 24 hour  Intake   1010 ml  Output      1 ml  Net   1009 ml   Labs: Basic Metabolic Panel:  Lab 01/21/11 9604 01/19/11 0640 01/18/11 0540 01/17/11 1655  NA 135 139 137 --  K 4.2 3.7 3.9 --  CL 91* 92* 94* --  CO2 29 29 31  --  GLUCOSE 79 132* 63* --  BUN 36* 14 29* --  CREATININE 6.40* 4.06* 6.09* --  CALCIUM 11.4* 10.7* 9.89.9 --  ALB -- -- -- --  PHOS 5.0* 4.2 -- 3.9   Liver Function Tests:  Lab 01/21/11 0645 01/19/11 0640 01/17/11 1655  AST -- -- 40*  ALT -- -- 35  ALKPHOS -- -- 90  BILITOT -- -- 0.3  PROT -- -- 8.7*  ALBUMIN 3.5 3.7 3.5   No results found for this basename: LIPASE:3,AMYLASE:3 in the last 168 hours No results found for this basename: AMMONIA:3 in the last 168 hours CBC:  Lab 01/21/11 0645 01/19/11 0832 01/19/11 0640 01/18/11 0540 01/17/11 1655  WBC 10.4 8.1 8.2 -- --  NEUTROABS -- -- -- -- 6.1  HGB 13.3 12.6 13.5 -- --  HCT 41.7 40.3 43.3 -- --  MCV 84.9 84.8 85.4 84.5 82.6  PLT 317 280 270 -- --   Cardiac Enzymes:  Lab 01/19/11 0640 01/18/11 2338 01/18/11 1146  CKTOTAL 101 111 134  CKMB 2.4 2.5 2.9  CKMBINDEX -- -- --  TROPONINI 0.52* 0.59* 0.74*   CBG:  Lab 01/20/11 2127 01/20/11 1626 01/20/11 1216 01/20/11 0757 01/20/11 0455  GLUCAP 438*  190* 393* 264* 245*  Studies/Results: Dg Chest 2 View  01/19/2011  *RADIOLOGY REPORT*  Clinical Data: Shortness of breath, cough.  CHEST - 2 VIEW  Comparison: 01/18/2011  Findings: Patchy left lung opacities are noted in the left lower lung and perihilar regions concerning for pneumonia.  Improving aeration on the right.  No confluent opacity.  Heart is borderline in size.  No effusions. Prior CABG.  IMPRESSION: Patchy left lung airspace disease concerning for pneumonia.  Borderline heart size.  Original Report Authenticated By: Cyndie Chime, M.D.   Medications:      . amLODipine  5 mg Oral QHS  . antiseptic oral rinse  15 mL Mouth Rinse BID  . aspirin  81 mg Oral Daily  . ceFEPime (MAXIPIME) IV  500 mg Intravenous Q24H  . cinacalcet  90 mg Oral Q breakfast  . fenofibrate  160 mg Oral Daily  . gabapentin  400 mg Oral Q0700  . glimepiride  2 mg Oral Q0700  . heparin  5,000 Units Subcutaneous  Q8H  . insulin aspart  0-9 Units Subcutaneous TID WC  . insulin aspart  10 Units Subcutaneous Once  . insulin glargine  10 Units Subcutaneous QHS  . multivitamin  1 tablet Oral Daily  . nystatin  5 mL Oral TID  . pantoprazole  40 mg Oral Q1200  . paricalcitol      . paricalcitol  10 mcg Intravenous Q M,W,F-HD  . polyethylene glycol  17 g Oral Daily  . pregabalin  50 mg Oral QPM  . sevelamer  3,200 mg Oral TID WC  . sevelamer  3,200 mg Oral TID WC  . sodium chloride  3 mL Intravenous Q12H  . vancomycin  1,000 mg Intravenous Q M,W,F-HD  . DISCONTD: insulin glargine  7 Units Subcutaneous QHS    I  have reviewed scheduled and prn medications.  Physical Exam: General: supine; breathing comfortably on HD Heart: RRR Lungs: coarse BS on left Abdomen: soft NT Extremities: no LE edema Dialysis Access: right upper AVGG; Qb 400; green light  Problem/Plan: 1. CAP - afebrile on Vanc and Maxipime - outpt regimen to be determined; we can give vanc, but maybe needs avelox as well? 2. ESRD - MWF per  routine 3. Anemia - no ESA needed 4. Secondary hyperparathyroidism - hypercalcemic on Zemplar, sensipar and renagel - zemplar given before labs back; on 2.5 Ca today; change to 2 Ca for the remainder of her treatment; Plan change to 2.25 Ca bath at d/c;  5. HTN/volume - BP ok; will have a lower edw at d/c; 75.4 pre HD ; outpt EDW was 78.5; new EDW approx 74. Have definitely reached EDW today 6. DM - per primary service; labile  Sheffield Slider, PA-C [facility] Beeper [phone]  01/21/2011,8:29 AM  LOS: 4 days   Pt seen and examined.  Agree with above note.  Hopefully for d/c today, we can give vanc with HD but will need something PO for PNA, consider avelox? Annie Sable, MD 01/21/2011

## 2011-01-24 LAB — CULTURE, BLOOD (ROUTINE X 2)
Culture  Setup Time: 201211270029
Culture: NO GROWTH

## 2011-03-17 ENCOUNTER — Ambulatory Visit (HOSPITAL_BASED_OUTPATIENT_CLINIC_OR_DEPARTMENT_OTHER): Admission: RE | Admit: 2011-03-17 | Payer: Medicare Other | Source: Ambulatory Visit | Admitting: Orthopedic Surgery

## 2011-03-17 ENCOUNTER — Encounter (HOSPITAL_BASED_OUTPATIENT_CLINIC_OR_DEPARTMENT_OTHER): Admission: RE | Payer: Self-pay | Source: Ambulatory Visit

## 2011-03-17 SURGERY — CARPAL TUNNEL RELEASE
Anesthesia: Monitor Anesthesia Care | Laterality: Left

## 2011-03-18 ENCOUNTER — Other Ambulatory Visit: Payer: Self-pay | Admitting: Family Medicine

## 2011-03-18 DIAGNOSIS — N63 Unspecified lump in unspecified breast: Secondary | ICD-10-CM

## 2011-04-26 ENCOUNTER — Other Ambulatory Visit

## 2011-04-26 ENCOUNTER — Encounter

## 2011-04-28 ENCOUNTER — Other Ambulatory Visit

## 2011-04-28 ENCOUNTER — Encounter

## 2011-05-03 ENCOUNTER — Encounter (HOSPITAL_BASED_OUTPATIENT_CLINIC_OR_DEPARTMENT_OTHER): Payer: Self-pay | Admitting: *Deleted

## 2011-05-03 NOTE — Progress Notes (Signed)
Dialysis pt-will need [facility] Had old lt upper and lower arm grafts Old rt lower arm graft Using rt upper arm graft. To bring all meds dos

## 2011-05-04 ENCOUNTER — Encounter (HOSPITAL_BASED_OUTPATIENT_CLINIC_OR_DEPARTMENT_OTHER): Payer: Self-pay | Admitting: *Deleted

## 2011-05-04 MED ORDER — LACTATED RINGERS IV SOLN
INTRAVENOUS | Status: DC
Start: 2011-05-05 — End: 2011-05-04

## 2011-05-05 ENCOUNTER — Ambulatory Visit (INDEPENDENT_AMBULATORY_CARE_PROVIDER_SITE_OTHER): Admitting: Ophthalmology

## 2011-05-05 ENCOUNTER — Ambulatory Visit (HOSPITAL_BASED_OUTPATIENT_CLINIC_OR_DEPARTMENT_OTHER): Admitting: Anesthesiology

## 2011-05-05 ENCOUNTER — Ambulatory Visit (HOSPITAL_BASED_OUTPATIENT_CLINIC_OR_DEPARTMENT_OTHER)
Admission: RE | Admit: 2011-05-05 | Discharge: 2011-05-05 | Disposition: A | Discharge status: Home / Self Care | Source: Ambulatory Visit | Attending: Orthopedic Surgery | Admitting: Orthopedic Surgery

## 2011-05-05 ENCOUNTER — Encounter (HOSPITAL_BASED_OUTPATIENT_CLINIC_OR_DEPARTMENT_OTHER): Payer: Self-pay

## 2011-05-05 ENCOUNTER — Encounter (HOSPITAL_BASED_OUTPATIENT_CLINIC_OR_DEPARTMENT_OTHER): Payer: Self-pay | Admitting: Anesthesiology

## 2011-05-05 ENCOUNTER — Encounter (HOSPITAL_BASED_OUTPATIENT_CLINIC_OR_DEPARTMENT_OTHER): Payer: Self-pay | Admitting: Certified Registered"

## 2011-05-05 ENCOUNTER — Encounter (HOSPITAL_BASED_OUTPATIENT_CLINIC_OR_DEPARTMENT_OTHER)
Admission: RE | Disposition: A | Discharge status: Home / Self Care | Payer: Self-pay | Source: Ambulatory Visit | Attending: Orthopedic Surgery

## 2011-05-05 DIAGNOSIS — Z8673 Personal history of transient ischemic attack (TIA), and cerebral infarction without residual deficits: Secondary | ICD-10-CM | POA: Insufficient documentation

## 2011-05-05 DIAGNOSIS — I5032 Chronic diastolic (congestive) heart failure: Secondary | ICD-10-CM | POA: Insufficient documentation

## 2011-05-05 DIAGNOSIS — E119 Type 2 diabetes mellitus without complications: Secondary | ICD-10-CM | POA: Insufficient documentation

## 2011-05-05 DIAGNOSIS — Z992 Dependence on renal dialysis: Secondary | ICD-10-CM | POA: Insufficient documentation

## 2011-05-05 DIAGNOSIS — I120 Hypertensive chronic kidney disease with stage 5 chronic kidney disease or end stage renal disease: Secondary | ICD-10-CM | POA: Insufficient documentation

## 2011-05-05 DIAGNOSIS — I2510 Atherosclerotic heart disease of native coronary artery without angina pectoris: Secondary | ICD-10-CM | POA: Insufficient documentation

## 2011-05-05 DIAGNOSIS — N2581 Secondary hyperparathyroidism of renal origin: Secondary | ICD-10-CM | POA: Insufficient documentation

## 2011-05-05 DIAGNOSIS — I509 Heart failure, unspecified: Secondary | ICD-10-CM | POA: Insufficient documentation

## 2011-05-05 DIAGNOSIS — N186 End stage renal disease: Secondary | ICD-10-CM | POA: Insufficient documentation

## 2011-05-05 DIAGNOSIS — G5600 Carpal tunnel syndrome, unspecified upper limb: Secondary | ICD-10-CM

## 2011-05-05 HISTORY — PX: CARPAL TUNNEL RELEASE: SHX101

## 2011-05-05 HISTORY — DX: Essential (primary) hypertension: I10

## 2011-05-05 HISTORY — DX: Unspecified osteoarthritis, unspecified site: M19.90

## 2011-05-05 LAB — POCT I-STAT, CHEM 8
Calcium, Ion: 1.15 mmol/L (ref 1.12–1.32)
HCT: 44 % (ref 36.0–46.0)
TCO2: 32 mmol/L (ref 0–100)

## 2011-05-05 LAB — GLUCOSE, CAPILLARY
Glucose-Capillary: 71 mg/dL (ref 70–99)
Glucose-Capillary: 110 mg/dL — ABNORMAL HIGH (ref 70–99)

## 2011-05-05 SURGERY — CARPAL TUNNEL RELEASE
Anesthesia: Monitor Anesthesia Care | Site: Hand | Laterality: Right | Wound class: Clean

## 2011-05-05 MED ORDER — LIDOCAINE HCL (PF) 1 % IJ SOLN
INTRAMUSCULAR | Status: DC | PRN
  Administered 2011-05-05: 9 mL

## 2011-05-05 MED ORDER — LIDOCAINE HCL (CARDIAC) 20 MG/ML IV SOLN
INTRAVENOUS | Status: DC | PRN
  Administered 2011-05-05: 40 mg via INTRAVENOUS

## 2011-05-05 MED ORDER — PROPOFOL 10 MG/ML IV EMUL: INTRAVENOUS | Status: DC | PRN

## 2011-05-05 MED ORDER — PROPOFOL 10 MG/ML IV EMUL
INTRAVENOUS | Status: DC | PRN
  Administered 2011-05-05: 60 ug/kg/min via INTRAVENOUS

## 2011-05-05 MED ORDER — BUPIVACAINE HCL (PF) 0.5 % IJ SOLN
INTRAMUSCULAR | Status: DC | PRN
  Administered 2011-05-05: 10 mL

## 2011-05-05 MED ORDER — SODIUM CHLORIDE 0.9 % IV SOLN
INTRAVENOUS | Status: DC
  Administered 2011-05-05 (×2): via INTRAVENOUS

## 2011-05-05 MED ORDER — HYDROCODONE-ACETAMINOPHEN 5-500 MG PO TABS: 1.0000 | ORAL_TABLET | Freq: Four times a day (QID) | ORAL | Status: AC | PRN

## 2011-05-05 MED ORDER — DOCUSATE SODIUM 100 MG PO CAPS: 100.0000 mg | ORAL_CAPSULE | Freq: Two times a day (BID) | ORAL | Status: AC

## 2011-05-05 MED ORDER — ONDANSETRON HCL 4 MG/2ML IJ SOLN
INTRAMUSCULAR | Status: DC | PRN
  Administered 2011-05-05: 4 mg via INTRAVENOUS

## 2011-05-05 MED ORDER — CEFAZOLIN SODIUM 1-5 GM-% IV SOLN
INTRAVENOUS | Status: DC | PRN
  Administered 2011-05-05: 2 g via INTRAVENOUS

## 2011-05-05 MED ORDER — FENTANYL CITRATE 0.05 MG/ML IJ SOLN
INTRAMUSCULAR | Status: DC | PRN
  Administered 2011-05-05 (×2): 25 ug via INTRAVENOUS

## 2011-05-05 MED ORDER — DEXTROSE 50 % IV SOLN
25.0000 mL | Freq: Once | INTRAVENOUS | Status: AC
  Administered 2011-05-05: 25 mL via INTRAVENOUS

## 2011-05-05 MED ORDER — MIDAZOLAM HCL 5 MG/5ML IJ SOLN
INTRAMUSCULAR | Status: DC | PRN
  Administered 2011-05-05: 0.5 mg via INTRAVENOUS
  Administered 2011-05-05: 1 mg via INTRAVENOUS

## 2011-05-05 SURGICAL SUPPLY — 47 items
BANDAGE ELASTIC 3 VELCRO ST LF (GAUZE/BANDAGES/DRESSINGS) ×2 IMPLANT
BANDAGE GAUZE ELAST BULKY 4 IN (GAUZE/BANDAGES/DRESSINGS) ×2 IMPLANT
BLADE SURG 15 STRL LF DISP TIS (BLADE) ×1 IMPLANT
BLADE SURG 15 STRL SS (BLADE) ×1
BNDG ESMARK 4X9 LF (GAUZE/BANDAGES/DRESSINGS) ×2 IMPLANT
CLOTH BEACON ORANGE TIMEOUT ST (SAFETY) ×2 IMPLANT
CORDS BIPOLAR (ELECTRODE) IMPLANT
COVER MAYO STAND STRL (DRAPES) ×2 IMPLANT
COVER TABLE BACK 60X90 (DRAPES) ×2 IMPLANT
CUFF TOURNIQUET SINGLE 18IN (TOURNIQUET CUFF) ×2 IMPLANT
DRAIN PENROSE 1/4X12 LTX STRL (WOUND CARE) IMPLANT
DRAPE EXTREMITY T 121X128X90 (DRAPE) ×2 IMPLANT
DRAPE SURG 17X23 STRL (DRAPES) ×2 IMPLANT
DRSG EMULSION OIL 3X3 NADH (GAUZE/BANDAGES/DRESSINGS) ×2 IMPLANT
GAUZE SPONGE 4X4 12PLY STRL LF (GAUZE/BANDAGES/DRESSINGS) ×2 IMPLANT
GAUZE SPONGE 4X4 16PLY XRAY LF (GAUZE/BANDAGES/DRESSINGS) IMPLANT
GAUZE XEROFORM 1X8 LF (GAUZE/BANDAGES/DRESSINGS) ×2 IMPLANT
GLOVE BIO SURGEON STRL SZ 6.5 (GLOVE) ×2 IMPLANT
GLOVE BIO SURGEON STRL SZ8 (GLOVE) ×2 IMPLANT
GLOVE BIOGEL M 7.0 STRL (GLOVE) ×2 IMPLANT
GLOVE BIOGEL PI IND STRL 7.5 (GLOVE) ×1 IMPLANT
GLOVE BIOGEL PI INDICATOR 7.5 (GLOVE) ×1
GOWN PREVENTION PLUS XLARGE (GOWN DISPOSABLE) ×2 IMPLANT
GOWN PREVENTION PLUS XXLARGE (GOWN DISPOSABLE) ×2 IMPLANT
KNIFE CARPAL TUNNEL (BLADE) IMPLANT
LOOP VESSEL MAXI BLUE (MISCELLANEOUS) IMPLANT
NDL SAFETY ECLIPSE 18X1.5 (NEEDLE) ×1 IMPLANT
NEEDLE HYPO 18GX1.5 SHARP (NEEDLE) ×1
NEEDLE HYPO 25X1 1.5 SAFETY (NEEDLE) ×2 IMPLANT
NS IRRIG 1000ML POUR BTL (IV SOLUTION) ×2 IMPLANT
PACK BASIN DAY SURGERY FS (CUSTOM PROCEDURE TRAY) ×2 IMPLANT
PAD ALCOHOL SWAB (MISCELLANEOUS) ×2 IMPLANT
PAD CAST 3X4 CTTN HI CHSV (CAST SUPPLIES) ×1 IMPLANT
PADDING CAST ABS 3INX4YD NS (CAST SUPPLIES)
PADDING CAST ABS 4INX4YD NS (CAST SUPPLIES)
PADDING CAST ABS COTTON 3X4 (CAST SUPPLIES) IMPLANT
PADDING CAST ABS COTTON 4X4 ST (CAST SUPPLIES) IMPLANT
PADDING CAST COTTON 3X4 STRL (CAST SUPPLIES) ×1
STOCKINETTE 4X48 STRL (DRAPES) ×2 IMPLANT
STOCKINETTE SYNTHETIC 3 UNSTER (CAST SUPPLIES) IMPLANT
SUT PROLENE 4 0 PS 2 18 (SUTURE) ×2 IMPLANT
SYR BULB 3OZ (MISCELLANEOUS) ×2 IMPLANT
SYR CONTROL 10ML LL (SYRINGE) ×4 IMPLANT
TOWEL OR 17X24 6PK STRL BLUE (TOWEL DISPOSABLE) ×2 IMPLANT
TRAY DSU PREP LF (CUSTOM PROCEDURE TRAY) ×2 IMPLANT
UNDERPAD 30X30 INCONTINENT (UNDERPADS AND DIAPERS) ×2 IMPLANT
WATER STERILE IRR 1000ML POUR (IV SOLUTION) IMPLANT

## 2011-05-05 NOTE — H&P (Signed)
Faith [name] is an 56 y.o. female.   Chief Complaint: right hand numbness and tingling HPI: pt followed in office  Here for surgery for right hand carpal tunnel Pt elects surgery  Past Medical History  Diagnosis Date  . ESRD (end stage renal disease) on dialysis   . Hyperparathyroidism, secondary renal   . CAD (coronary artery disease) of artery bypass graft     CABG in 2002  . PAD (peripheral artery disease)     Not a candidate for revascularization  . Chronic constipation   . GERD (gastroesophageal reflux disease)   . Anemia of chronic renal failure   . Tardive dyskinesia     Secondary to Reglan  . Depression   . Neuropathy   . Diabetic retinopathy   . Diastolic CHF, chronic     Grade 2  . Pulmonary embolism 06/2003  . Hyperlipidemia   . Myocardial infarction 2002  . Stroke 2002    "w/left side" residual weakness  . Dialysis patient 01/17/11    [name]; M, W, F; via right upper arm AVF  . Diabetes mellitus     type II  . CHF (congestive heart failure)   . CVA (cerebral infarction)     right brain with minimal left hemiparesis  . Pneumonia 10/2009; 11/2009    ?pneumonia 01/17/11  . Hypertension   . Arthritis   . Complication of anesthesia     "sleepy afterwards; due to her being dialysis pt"    Past Surgical History  Procedure Date  . Coronary artery bypass graft 2002  . Laparoscopic cholecystectomy 2002  . Av fistula placement     times 4-using rt upper arm graft-old rt lower arm,lt upper and lower old grafts  . Trigger finger release   . Tubal ligation   . Eye examination under anesthesia w/ retinal cryotherapy and retinal laser   . Coronary angioplasty with stent placement   . Cataract extraction 07/2010    right sided   . Toe amputation 5/12    left 2nd toe secondary to osteomyelitis  . Breast biopsy 06/2010    Family History  Problem Relation Age of Onset  . Diabetes Mother   . Cirrhosis Mother   . Diabetes Father   . Heart attack Father     . Diabetes Sister   . Diabetes Sister   . Diabetes Sister    Social History:  reports that she has never smoked. She has never used smokeless tobacco. She reports that she uses illicit drugs (Marijuana). She reports that she does not drink alcohol.  Allergies:  Allergies  Allergen Reactions  . Codeine Other (See Comments)    unknown  . Lipitor (Atorvastatin Calcium) Other (See Comments)    unknown  . Reglan Other (See Comments)    unknown    Medications Prior to Admission  Medication Dose Route Frequency Provider Last Rate Last Dose  . 0.9 %  sodium chloride infusion   Intravenous Continuous Hart Robinsons, MD 20 mL/hr at 05/05/11 1052    . dextrose 50 % solution 25 mL  25 mL Intravenous Once E. Jairo Ben, MD   25 mL at 05/05/11 1105  . dextrose 50 % solution 25 mL  25 mL Intravenous Once E. Jairo Ben, MD   25 mL at 05/05/11 1130  . DISCONTD: lactated ringers infusion   Intravenous Continuous Hart Robinsons, MD       Medications Prior to Admission  Medication Sig Dispense Refill  . acetaminophen (TYLENOL  ARTHRITIS PAIN) 650 MG CR tablet Take 1,300 mg by mouth every 8 (eight) hours as needed. For pain       . amLODipine (NORVASC) 5 MG tablet Take 5 mg by mouth daily.        Marland Kitchen aspirin 81 MG chewable tablet Chew 81 mg by mouth daily.        . benzonatate (TESSALON) 200 MG capsule Take 200 mg by mouth 3 (three) times daily as needed. For cough      . cinacalcet (SENSIPAR) 90 MG tablet Take 90 mg by mouth daily.        . fenofibrate micronized (LOFIBRA) 134 MG capsule Take 134 mg by mouth daily.        Marland Kitchen gabapentin (NEURONTIN) 400 MG capsule Take 400 mg by mouth every morning.        . multivitamin (RENA-VIT) TABS tablet Take 1 tablet by mouth daily.        . pantoprazole (PROTONIX) 40 MG tablet Take 40 mg by mouth daily.        . phenol (CHLORASEPTIC) 1.4 % LIQD Use as directed 1 spray in the mouth or throat as needed.  29 mL  1  . polyethylene glycol (MIRALAX /  GLYCOLAX) packet Take 17 g by mouth daily.        . pregabalin (LYRICA) 50 MG capsule Take 50 mg by mouth every evening.        . sevelamer (RENAGEL) 800 MG tablet Take 3,200 mg by mouth 3 (three) times daily with meals.        Marland Kitchen glimepiride (AMARYL) 4 MG tablet Take 4 mg by mouth every morning.        Marland Kitchen HYDROcodone-acetaminophen (VICODIN) 5-500 MG per tablet Take 1 tablet by mouth 2 (two) times daily.        . insulin glargine (LANTUS) 100 UNIT/ML injection Inject 13 Units into the skin at bedtime.       . Menthol, Topical Analgesic, (BIOFREEZE ROLL-ON) 4 % GEL Apply 1 application topically daily as needed. For back pain       . polyvinyl alcohol (LIQUIFILM TEARS) 1.4 % ophthalmic solution Place 1 drop into both eyes daily as needed. For dryness       . promethazine (PHENERGAN) 25 MG tablet Take 25 mg by mouth every 6 (six) hours as needed. For nausea       . sodium phosphates (FLEET) 18-48 GM/100ML solution Take 100 mLs by mouth daily.          Results for orders placed during the hospital encounter of 05/05/11 (from the past 48 hour(s))  GLUCOSE, CAPILLARY     Status: Normal   Collection Time   05/05/11  9:38 AM      Component Value Range Comment   Glucose-Capillary 71  70 - 99 (mg/dL)   POCT I-STAT, CHEM 8     Status: Abnormal   Collection Time   05/05/11 11:00 AM      Component Value Range Comment   Sodium 141  135 - 145 (mEq/L)    Potassium 4.5  3.5 - 5.1 (mEq/L)    Chloride 101  96 - 112 (mEq/L)    BUN 21  6 - 23 (mg/dL)    Creatinine, Ser 9.60 (*) 0.50 - 1.10 (mg/dL)    Glucose, Bld 47 (*) 70 - 99 (mg/dL)    Calcium, Ion 4.54  1.12 - 1.32 (mmol/L)    TCO2 32  0 - 100 (mmol/L)  Hemoglobin 15.0  12.0 - 15.0 (g/dL)    HCT 11.9  14.7 - 82.9 (%)   GLUCOSE, CAPILLARY     Status: Abnormal   Collection Time   05/05/11 11:57 AM      Component Value Range Comment   Glucose-Capillary 110 (*) 70 - 99 (mg/dL)    No results found.  No recent illnesses  Blood pressure 157/71, pulse  73, temperature 98.4 F (36.9 C), temperature source Oral, resp. rate 20, height 5' 4.5" (1.638 m), weight 81.647 kg (180 lb), SpO2 90.00%. General Appearance:  Alert, cooperative, no distress, appears stated age  Head:  Normocephalic, without obvious abnormality, atraumatic  Eyes:  Pupils equal, conjunctiva/corneas clear,         Throat: Lips, mucosa, and tongue normal; teeth and gums normal  Neck: No visible masses     Lungs:   respirations unlabored  Chest Wall:  No tenderness or deformity  Heart:  Regular rate and rhythm,  Abdomen:   Soft, non-tender,         Extremities: Right hand/arm: nonfunctioning graft over forearm Fistula working proximally Fingers warm well perfused Good cap refil.   Pulses: Palpable radial pulse  Skin: Skin color, texture, turgor normal, no rashes or lesions     Neurologic: Normal    Assessment/Plan Right hand carpal tunnel  Right hand carpal tunnel release  R/B/A DISCUSSED WITH PT IN OFFICE.  PT VOICED UNDERSTANDING OF PLAN CONSENT SIGNED DAY OF SURGERY PT SEEN AND EXAMINED PRIOR TO OPERATIVE PROCEDURE/DAY OF SURGERY SITE MARKED. QUESTIONS ANSWERED WILL GO HOME FOLLOWING SURGERY  Sharma Covert 05/05/2011, 1:03 PM

## 2011-05-05 NOTE — Brief Op Note (Signed)
05/05/2011  1:58 PM  PATIENT:  Faith [name]  56 y.o. female  PRE-OPERATIVE DIAGNOSIS:  right hand cts  POST-OPERATIVE DIAGNOSIS:  right hand cts  PROCEDURE:  Procedure(s) (LRB): CARPAL TUNNEL RELEASE (Right)  SURGEON:  Surgeon(s) and Role:    * Sharma Covert, MD - Primary  PHYSICIAN ASSISTANT:   ASSISTANTS: none   ANESTHESIA:   IV sedation  EBL:  Total I/O In: 500 [I.V.:500] Out: -   BLOOD ADMINISTERED:none  DRAINS: none   LOCAL MEDICATIONS USED:  MARCAINE     SPECIMEN:  No Specimen  DISPOSITION OF SPECIMEN:  N/A  COUNTS:  YES  TOURNIQUET:   Total Tourniquet Time Documented: Forearm (Right) - 13 minutes  DICTATION: .Other Dictation: Dictation Number [id number]  PLAN OF CARE: Discharge to home after PACU  PATIENT DISPOSITION:  PACU - hemodynamically stable.   Delay start of Pharmacological VTE agent (>24hrs) due to surgical blood loss or risk of bleeding: not applicable

## 2011-05-05 NOTE — Transfer of Care (Signed)
Immediate Anesthesia Transfer of Care Note  Patient: Faith [name]  Procedure(s) Performed: Procedure(s) (LRB): CARPAL TUNNEL RELEASE (Right)  Patient Location: PACU  Anesthesia Type: MAC  Level of Consciousness: awake, alert , oriented and patient cooperative  Airway & Oxygen Therapy: Patient Spontanous Breathing and Patient connected to face mask oxygen  Post-op Assessment: Report given to PACU RN and Post -op Vital signs reviewed and stable  Post vital signs: Reviewed and stable  Complications: No apparent anesthesia complications

## 2011-05-05 NOTE — Discharge Instructions (Signed)
KEEP BANDAGE CLEAN AND DRY CALL OFFICE FOR F/U APPT [phone] in 12 days KEEP HAND ELEVATED ABOVE HEART OK TO APPLY ICE TO OPERATIVE AREA CONTACT OFFICE IF ANY WORSENING PAIN OR CONCERNS  .Carpal Tunnel Release (Repair) Care After Refer to this sheet in the next few weeks. These discharge instructions provide you with general information on caring for yourself after you leave the hospital. Your caregiver may also give you specific instructions. Your treatment has been planned according to the most current medical practices available, but unavoidable complications sometimes occur. If you have any problems or questions after discharge, please call your caregiver. HOME CARE INSTRUCTIONS   Have a responsible person with you for 24 hours.   Do not drive a car or take public transportation for 24 hours.   Only take over-the-counter or prescription medicines for pain, discomfort, or fever as directed by your caregiver. Take them as directed.   You may put ice on the palm side of the affected wrist.   Put ice in a plastic bag.   Place a towel between your skin and the bag.   Leave the ice on for 15 to 20 minutes, 3 to 4 times per day.   If you were given a splint to keep your wrist from bending, use it as directed. It is important to wear the splint at night or as directed. Use the splint for as long as you have pain or numbness in your hand, arm, or wrist. This may take 1 to 2 months.   Keep your hand raised (elevated) above the level of your heart as much as possible. This keeps swelling down and helps with discomfort.   Change bandages (dressings) as directed.   Keep the wound clean and dry.  SEEK MEDICAL CARE IF:   You develop pain not relieved with medicines.   You develop numbness of your hand.   You develop bleeding from your surgical site.   You have an oral temperature above 102 F (38.9 C).   You develop redness or swelling of the surgical site.   You develop new,  unexplained problems.  SEEK IMMEDIATE MEDICAL CARE IF:   You develop a rash.   You have difficulty breathing.   You develop any reaction or side effects to medicines given.  MAKE SURE YOU:   Understand these instructions.   Will watch your condition.   Will get help right away if you are not doing well or get worse.  Document Released: 08/27/2004 Document Revised: 01/27/2011 Document Reviewed: 12/14/2006 [facility] Patient Information 2012 [place], [place].  [facility]  [address] [place], [place] [address] [phone]   Post Anesthesia Home Care Instructions  Activity: Get plenty of rest for the remainder of the day. A responsible adult should stay with you for 24 hours following the procedure.  For the next 24 hours, DO NOT: -Drive a car -Advertising copywriter -Drink alcoholic beverages -Take any medication unless instructed by your physician -Make any legal decisions or sign important papers.  Meals: Start with liquid foods such as gelatin or soup. Progress to regular foods as tolerated. Avoid greasy, spicy, heavy foods. If nausea and/or vomiting occur, drink only clear liquids until the nausea and/or vomiting subsides. Call your physician if vomiting continues.  Special Instructions/Symptoms: Your throat may feel dry or sore from the anesthesia or the breathing tube placed in your throat during surgery. If this causes discomfort, gargle with warm salt water. The discomfort should disappear within 24  hours.   Call your surgeon if you experience:   1.  Fever over 101.0. 2.  Inability to urinate. 3.  Nausea and/or vomiting. 4.  Extreme swelling or bruising at the surgical site. 5.  Continued bleeding from the incision. 6.  Increased pain, redness or drainage from the incision. 7.  Problems related to your pain medication.

## 2011-05-05 NOTE — Anesthesia Postprocedure Evaluation (Signed)
Anesthesia Post Note  Patient: Faith [name]  Procedure(s) Performed: Procedure(s) (LRB): CARPAL TUNNEL RELEASE (Right)  Anesthesia type: MAC  Patient location: PACU  Post pain: Pain level controlled  Post assessment: Patient's Cardiovascular Status Stable  Last Vitals:  Filed Vitals:   05/05/11 1430  BP: 132/54  Pulse: 71  Temp:   Resp: 18    Post vital signs: Reviewed and stable  Level of consciousness: alert  Complications: No apparent anesthesia complications

## 2011-05-05 NOTE — Anesthesia Procedure Notes (Signed)
Procedure Name: MAC Date/Time: 05/05/2011 1:17 PM Performed by: Verlan Friends Pre-anesthesia Checklist: Patient identified, Timeout performed, Emergency Drugs available, Suction available and Patient being monitored Oxygen Delivery Method: Simple face mask Placement Confirmation: positive ETCO2

## 2011-05-05 NOTE — Anesthesia Preprocedure Evaluation (Addendum)
Anesthesia Evaluation  Patient identified by MRN, date of birth, ID band Patient awake    Reviewed: Allergy & Precautions, H&P , NPO status , Patient's Chart, lab work & pertinent test results  History of Anesthesia Complications (+) PONV  Airway Mallampati: II TM Distance: >3 FB Neck ROM: Full    Dental  (+) Edentulous Upper and Edentulous Lower   Pulmonary  breath sounds clear to auscultation  Pulmonary exam normal       Cardiovascular hypertension, Pt. on medications Angina: patient denies chest pain on exertion. + CAD (2012 cath: SVG patent/minimal disease, normal LVF, EF 70%, medical management recommended), + Past MI and +CHF Rhythm:Regular Rate:Normal     Neuro/Psych Depression  Neuromuscular disease (peripheral neuropathy) CVA (L weakness, uses walker), Residual Symptoms    GI/Hepatic GERD-  Medicated and Controlled,  Endo/Other  Diabetes mellitus- (glu 71 on arrival, 47 now), Type 2, Insulin DependentMorbid obesity  Renal/GU ESRF and DialysisRenal disease (dialyzed yesterday, K+ 4.5)     Musculoskeletal   Abdominal (+) + obese,   Peds  Hematology   Anesthesia Other Findings   Reproductive/Obstetrics                         Anesthesia Physical Anesthesia Plan  ASA: III  Anesthesia Plan: MAC   Post-op Pain Management:    Induction:   Airway Management Planned: Simple Face Mask  Additional Equipment:   Intra-op Plan:   Post-operative Plan:   Informed Consent: I have reviewed the patients History and Physical, chart, labs and discussed the procedure including the risks, benefits and alternatives for the proposed anesthesia with the patient or authorized representative who has indicated his/her understanding and acceptance.     Plan Discussed with: CRNA and Surgeon  Anesthesia Plan Comments: (Plan routine monitors, MAC)        Anesthesia Quick Evaluation

## 2011-05-06 ENCOUNTER — Encounter (HOSPITAL_BASED_OUTPATIENT_CLINIC_OR_DEPARTMENT_OTHER): Payer: Self-pay | Admitting: Orthopedic Surgery

## 2011-05-06 NOTE — Op Note (Signed)
NAME:  Faith [name], Faith [name] NO.:  MEDICAL RECORD NO.:  [id number]  LOCATION:                                 FACILITY:  PHYSICIAN:  Madelynn Done, MD  DATE OF BIRTH:  1955-07-15  DATE OF PROCEDURE:  05/05/2011 DATE OF DISCHARGE:                              OPERATIVE REPORT   PREOPERATIVE DIAGNOSES: 1. Right hand carpal tunnel syndrome. 2. End-stage renal disease, on dialysis.  POSTOPERATIVE DIAGNOSES: 1. Right hand carpal tunnel syndrome. 2. End-stage renal disease, on dialysis.  PROCEDURE:  Right hand carpal tunnel release.  ATTENDING PHYSICIAN:  Sharma Covert IV, MD, who scrubbed and present for the entire procedure.  ASSISTANT SURGEON:  None.  ANESTHESIA:  1% Xylocaine, 0.25% Marcaine, local block with IV sedation.  SURGICAL INDICATIONS:  [name] is a 56 year old right-hand-dominant female with signs and symptoms consistent with right hand carpal tunnel. This has been refractory to conservative treatment.  The patient elected to undergo the above procedure.  Risks, benefits, and alternatives were discussed in detail with the patient and signed informed consent was obtained.  Risks include but not limited to bleeding; infection; damage to nearby nerves, arteries, tendons; loss of motion of the elbow, wrist, and digits; incomplete relief of symptoms; and need for further surgical intervention.  DESCRIPTION OF PROCEDURE:  The patient was properly identified in the preop holding area, mark with a permanent marker made on the right hand to indicate correct operative site.  The patient was then brought back to the operating room, placed supine on the anesthesia room table, where the IV sedation was administered.  Local anesthetic was administered. The right upper extremity was then prepped and draped in normal sterile fashion after the forearm tourniquet had been applied.  Time-out was called.  Correct site was identified after prepping and  draping and the procedure was then begun.  The limb was then elevated and tourniquet inflated on the forearm.  The dissection was then carried down through the skin and subcutaneous tissue.  The palmar fascia was incised longitudinally.  Direct exposure of the transverse carpal ligament was then carried out, and under direct visualization, distal one-half of the transverse carpal ligament released with a #15 blade.  Further exposure was then carried out proximally.  The remaining portion of the transverse carpal ligament was released in its entirety.  Following this, a portion of the antebrachial fascia was then released under direct visualization.  The tourniquet was then deflated.  The wound was then thoroughly irrigated.  The skin was then closed using horizontal mattress Prolene sutures.  Xeroform dressing, sterile compressive bandage was then applied.  The patient tolerated the procedure well and returned to recovery room in good condition.  POSTOPERATIVE PLAN:  The patient discharged home. Will be seen back in the office in approximately 12 days for wound check, likely suture removal, and begin a postoperative carpal tunnel eval and treat.     Madelynn Done, MD     FWO/MEDQ  D:  05/05/2011  T:  05/06/2011  Job:  [id number]

## 2011-05-19 ENCOUNTER — Ambulatory Visit (INDEPENDENT_AMBULATORY_CARE_PROVIDER_SITE_OTHER): Payer: Medicare Other | Admitting: Ophthalmology

## 2011-05-19 DIAGNOSIS — E1065 Type 1 diabetes mellitus with hyperglycemia: Secondary | ICD-10-CM

## 2011-05-19 DIAGNOSIS — E11359 Type 2 diabetes mellitus with proliferative diabetic retinopathy without macular edema: Secondary | ICD-10-CM

## 2011-05-19 DIAGNOSIS — H2510 Age-related nuclear cataract, unspecified eye: Secondary | ICD-10-CM

## 2011-05-19 DIAGNOSIS — H26499 Other secondary cataract, unspecified eye: Secondary | ICD-10-CM

## 2011-05-19 DIAGNOSIS — H43819 Vitreous degeneration, unspecified eye: Secondary | ICD-10-CM

## 2011-06-07 ENCOUNTER — Encounter (INDEPENDENT_AMBULATORY_CARE_PROVIDER_SITE_OTHER): Payer: Medicare Other | Admitting: Ophthalmology

## 2011-06-09 ENCOUNTER — Encounter (INDEPENDENT_AMBULATORY_CARE_PROVIDER_SITE_OTHER): Payer: Medicare (Managed Care) | Admitting: Ophthalmology

## 2011-06-09 DIAGNOSIS — H2700 Aphakia, unspecified eye: Secondary | ICD-10-CM

## 2011-07-05 ENCOUNTER — Other Ambulatory Visit (HOSPITAL_COMMUNITY): Payer: Self-pay | Admitting: Cardiology

## 2011-07-05 DIAGNOSIS — E1129 Type 2 diabetes mellitus with other diabetic kidney complication: Secondary | ICD-10-CM

## 2011-07-05 DIAGNOSIS — I2510 Atherosclerotic heart disease of native coronary artery without angina pectoris: Secondary | ICD-10-CM

## 2011-07-19 ENCOUNTER — Ambulatory Visit (HOSPITAL_COMMUNITY)
Admission: RE | Admit: 2011-07-19 | Discharge: 2011-07-19 | Disposition: A | Discharge status: Home / Self Care | Source: Ambulatory Visit | Attending: Cardiology | Admitting: Cardiology

## 2011-07-19 ENCOUNTER — Other Ambulatory Visit: Payer: Self-pay

## 2011-07-19 ENCOUNTER — Encounter (HOSPITAL_COMMUNITY)
Admission: RE | Admit: 2011-07-19 | Discharge: 2011-07-19 | Disposition: A | Discharge status: Home / Self Care | Source: Ambulatory Visit | Attending: Cardiology | Admitting: Cardiology

## 2011-07-19 ENCOUNTER — Other Ambulatory Visit (HOSPITAL_COMMUNITY): Payer: Self-pay | Admitting: Cardiology

## 2011-07-19 DIAGNOSIS — R079 Chest pain, unspecified: Secondary | ICD-10-CM | POA: Insufficient documentation

## 2011-07-19 DIAGNOSIS — I2510 Atherosclerotic heart disease of native coronary artery without angina pectoris: Secondary | ICD-10-CM

## 2011-07-19 DIAGNOSIS — E1129 Type 2 diabetes mellitus with other diabetic kidney complication: Secondary | ICD-10-CM

## 2011-07-19 DIAGNOSIS — R9431 Abnormal electrocardiogram [ECG] [EKG]: Secondary | ICD-10-CM | POA: Insufficient documentation

## 2011-07-19 MED ORDER — TECHNETIUM TC 99M TETROFOSMIN IV KIT
30.0000 | PACK | Freq: Once | INTRAVENOUS | Status: AC | PRN
  Administered 2011-07-19: 30 via INTRAVENOUS

## 2011-07-19 MED ORDER — REGADENOSON 0.4 MG/5ML IV SOLN
INTRAVENOUS | Status: AC
  Administered 2011-07-19: 0.4 mg
  Filled 2011-07-19: qty 5

## 2011-07-19 MED ORDER — MORPHINE SULFATE 4 MG/ML IJ SOLN
INTRAMUSCULAR | Status: AC
  Filled 2011-07-19: qty 1

## 2011-07-19 MED ORDER — MORPHINE SULFATE 4 MG/ML IJ SOLN
2.0000 mg | Freq: Once | INTRAMUSCULAR | Status: AC
  Administered 2011-07-19: 2 mg via INTRAVENOUS

## 2011-07-19 MED ORDER — TECHNETIUM TC 99M TETROFOSMIN IV KIT
10.0000 | PACK | Freq: Once | INTRAVENOUS | Status: AC | PRN
  Administered 2011-07-19: 10 via INTRAVENOUS

## 2011-07-19 NOTE — Procedures (Signed)
Left iv into basilic vein under [place], 56f dilator No complication No blood loss. See complete dictation in [facility].

## 2011-07-21 ENCOUNTER — Other Ambulatory Visit: Payer: Self-pay | Admitting: Family Medicine

## 2011-07-21 ENCOUNTER — Ambulatory Visit
Admission: RE | Admit: 2011-07-21 | Discharge: 2011-07-21 | Disposition: A | Discharge status: Home / Self Care | Payer: No Typology Code available for payment source | Source: Ambulatory Visit | Attending: Family Medicine | Admitting: Family Medicine

## 2011-07-21 DIAGNOSIS — R52 Pain, unspecified: Secondary | ICD-10-CM

## 2011-10-07 ENCOUNTER — Other Ambulatory Visit: Payer: Self-pay

## 2011-10-07 ENCOUNTER — Other Ambulatory Visit (HOSPITAL_COMMUNITY): Payer: Self-pay | Admitting: Nephrology

## 2011-10-07 DIAGNOSIS — N186 End stage renal disease: Secondary | ICD-10-CM

## 2011-10-07 DIAGNOSIS — I739 Peripheral vascular disease, unspecified: Secondary | ICD-10-CM

## 2011-10-10 ENCOUNTER — Encounter (HOSPITAL_COMMUNITY): Payer: Self-pay | Admitting: Pharmacy Technician

## 2011-10-12 ENCOUNTER — Encounter: Payer: Self-pay | Admitting: Vascular Surgery

## 2011-10-13 ENCOUNTER — Encounter: Payer: Self-pay | Admitting: Vascular Surgery

## 2011-10-13 ENCOUNTER — Ambulatory Visit (INDEPENDENT_AMBULATORY_CARE_PROVIDER_SITE_OTHER): Payer: Medicare Other | Admitting: Vascular Surgery

## 2011-10-13 VITALS — BP 154/67 | HR 90 | Resp 18 | Ht 64.5 in | Wt 181.0 lb

## 2011-10-13 DIAGNOSIS — I739 Peripheral vascular disease, unspecified: Secondary | ICD-10-CM

## 2011-10-13 NOTE — Progress Notes (Signed)
[facility] [place] HISTORY AND PHYSICAL   History of Present Illness:  Patient is a 56 y.o. year old female who presents for evaluation of ischemia right second toe.  This has been slowly progressive over the last few weeks. She has mild pain in the toe.  She previously underwent an arteriogram in may of 2012 for similar problem in the left foot. At that time she had in-line flow to the right foot via the peroneal artery. She also had one vessel runoff to the left foot via the anterior tibial artery. She underwent successful left second toe amputation by due to and did not have any wound healing problems. She denies any fever or chills. She has had no drainage from the toe. Other medical problems include end-stage renal disease with dialysis on Monday Wednesday and Friday. She also has a history of coronary artery disease, retinopathy, neuropathy, reflux, prior stroke, prior congestive heart failure. These are all currently controlled.  Past Medical History  Diagnosis Date  . ESRD (end stage renal disease) on dialysis   . Hyperparathyroidism, secondary renal   . CAD (coronary artery disease) of artery bypass graft     CABG in 2002  . PAD (peripheral artery disease)     Not a candidate for revascularization  . Chronic constipation   . GERD (gastroesophageal reflux disease)   . Anemia of chronic renal failure   . Tardive dyskinesia     Secondary to Reglan  . Depression   . Neuropathy   . Diabetic retinopathy   . Diastolic CHF, chronic     Grade 2  . Pulmonary embolism 06/2003  . Hyperlipidemia   . Myocardial infarction 2002  . Stroke 2002    "w/left side" residual weakness  . Dialysis patient 01/17/11    [address]; M, W, F; via right upper arm AVF  . Diabetes mellitus     type II  . CHF (congestive heart failure)   . CVA (cerebral infarction)     right brain with minimal left hemiparesis  . Pneumonia 10/2009; 11/2009    ?pneumonia 01/17/11  . Hypertension     . Arthritis   . Complication of anesthesia     "sleepy afterwards; due to her being dialysis pt"    Past Surgical History  Procedure Date  . Coronary artery bypass graft 2002  . Laparoscopic cholecystectomy 2002  . Av fistula placement     times 4-using rt upper arm graft-old rt lower arm,lt upper and lower old grafts  . Trigger finger release   . Tubal ligation   . Eye examination under anesthesia w/ retinal cryotherapy and retinal laser   . Coronary angioplasty with stent placement   . Cataract extraction 07/2010    right sided   . Toe amputation 5/12    left 2nd toe secondary to osteomyelitis  . Breast biopsy 06/2010  . Carpal tunnel release 05/05/2011    Procedure: CARPAL TUNNEL RELEASE;  Surgeon: Fred W Ortmann, MD;  Location: [facility];  Service: Orthopedics;  Laterality: Right;     Social History History  Substance Use Topics  . Smoking status: Never Smoker   . Smokeless tobacco: Never Used  . Alcohol Use: No    Family History Family History  Problem Relation Age of Onset  . Diabetes Mother   . Cirrhosis Mother   . Diabetes Father   . Heart attack Father   . Hyperlipidemia Father   . Heart disease Father   .   Diabetes Sister   . Hyperlipidemia Sister   . Hypertension Sister   . Diabetes Sister   . Diabetes Sister   . Heart disease Brother   . Hyperlipidemia Brother     Allergies  Allergies  Allergen Reactions  . Codeine Other (See Comments)    unknown  . Lipitor (Atorvastatin Calcium) Other (See Comments)    unknown  . Metoclopramide Hcl Other (See Comments)    unknown     Current Outpatient Prescriptions  Medication Sig Dispense Refill  . acetaminophen (TYLENOL ARTHRITIS PAIN) 650 MG CR tablet Take 650 mg by mouth every 8 (eight) hours as needed. For pain      . amLODipine (NORVASC) 5 MG tablet Take 5 mg by mouth at bedtime.       . aspirin 81 MG chewable tablet Chew 81 mg by mouth daily.        . cinacalcet (SENSIPAR) 30 MG  tablet Take 30 mg by mouth daily.      . cinacalcet (SENSIPAR) 90 MG tablet Take 90 mg by mouth every evening.       . fenofibrate micronized (LOFIBRA) 134 MG capsule Take 134 mg by mouth daily.       . gabapentin (NEURONTIN) 400 MG capsule Take 400 mg by mouth at bedtime.       . glimepiride (AMARYL) 4 MG tablet Take 4 mg by mouth every morning.        . HYDROcodone-acetaminophen (VICODIN) 5-500 MG per tablet Take 1 tablet by mouth 2 (two) times daily.        . insulin aspart (NOVOLOG) 100 UNIT/ML injection Inject into the skin 3 (three) times daily before meals. Sliding scale      . insulin glargine (LANTUS) 100 UNIT/ML injection Inject 6 Units into the skin at bedtime.       . multivitamin (RENA-VIT) TABS tablet Take 1 tablet by mouth daily.        . pantoprazole (PROTONIX) 40 MG tablet Take 40 mg by mouth at bedtime.       . polyvinyl alcohol (LIQUIFILM TEARS) 1.4 % ophthalmic solution Place 1 drop into both eyes daily as needed. For dryness       . sevelamer (RENAGEL) 800 MG tablet Take 1,600-3,200 mg by mouth 3 (three) times daily with meals.       . promethazine (PHENERGAN) 25 MG tablet Take 25 mg by mouth every 6 (six) hours as needed. For nausea         ROS:   General:  No weight loss, Fever, chills  HEENT: No recent headaches, no nasal bleeding, no visual changes, no sore throat  Neurologic: No dizziness, blackouts, seizures. No recent symptoms of stroke or mini- stroke. No recent episodes of slurred speech, or temporary blindness.  Cardiac: No recent episodes of chest pain/pressure, no shortness of breath at rest.  No shortness of breath with exertion.  Denies history of atrial fibrillation or irregular heartbeat  Vascular: No history of rest pain in feet.  No history of claudication.  No history of non-healing ulcer, No history of DVT   Pulmonary: No home oxygen, no productive cough, no hemoptysis,  No asthma or wheezing  Musculoskeletal:  [ ] Arthritis, [ ] Low back pain,  [  ] Joint pain  Hematologic:No history of hypercoagulable state.  No history of easy bleeding.  No history of anemia  Gastrointestinal: No hematochezia or melena,  No gastroesophageal reflux, no trouble swallowing  Urinary: [ ] chronic Kidney   disease, [ ] on HD - [x ] MWF or [ ] TTHS, [ ] Burning with urination, [ ] Frequent urination, [ ] Difficulty urinating;   Skin: No rashes  Psychological: No history of anxiety,  No history of depression   Physical Examination  Filed Vitals:   10/13/11 1558  BP: 154/67  Pulse: 90  Resp: 18  Height: 5' 4.5" (1.638 m)  Weight: 181 lb (82.101 kg)    Body mass index is 30.59 kg/(m^2).  General:  Alert and oriented, no acute distress HEENT: Normal Neck: No bruit or JVD Pulmonary: Clear to auscultation bilaterally Cardiac: Regular Rate and Rhythm Abdomen: Soft, non-tender, non-distended, no mass Skin: No rash Extremity Pulses:  2+ radial, brachial, femoral, absent dorsalis pedis, posterior tibial pulses bilaterally Musculoskeletal: No deformity or edema, dry gangrene right second toe, distal and middle phalanx  Neurologic: Upper and lower extremity motor 5/5 and symmetric  DATA:   The patient had bilateral ABIs today which are reviewed and interpreted. She had non-compressible calcified vessels. Digit pressure on the right foot was 54. Digit pressure on the left side was 154.   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 The patient had in line flow via the peroneal artery on arteriogram approximately 15 months ago. However I believe it has been long enough that we should repeat her arteriogram to make sure that the peroneal artery has not become further disease prior to proceeding with amputation. The patient was originally scheduled to have a shuntogram and interventional radiology on September 29. However, since she needs an arteriogram I have scheduled her to have her shuntogram  with my partner Dr. Chen who will also perform her lower extremity arteriogram at the same procedure. Risks benefits possible complications procedure details of the arteriogram as well as shuntogram and possible intervention were explained the patient today. If she has in line flow to the right foot L. schedule her for a second toe indication the near future.   PLAN:  See above  Charles Fields, MD [facility]  Office: [phone] Pager: [phone]  

## 2011-10-13 NOTE — Progress Notes (Signed)
Ankle and toe brachial indices performed @ [facility] 10/13/2011

## 2011-10-14 ENCOUNTER — Other Ambulatory Visit: Payer: Self-pay

## 2011-10-18 ENCOUNTER — Ambulatory Visit (HOSPITAL_COMMUNITY): Admission: RE | Admit: 2011-10-18 | Payer: No Typology Code available for payment source | Source: Ambulatory Visit

## 2011-10-20 ENCOUNTER — Encounter (HOSPITAL_COMMUNITY)
Admission: RE | Disposition: A | Discharge status: Home / Self Care | Payer: Self-pay | Source: Ambulatory Visit | Attending: Vascular Surgery

## 2011-10-20 ENCOUNTER — Telehealth: Payer: Self-pay | Admitting: Vascular Surgery

## 2011-10-20 ENCOUNTER — Ambulatory Visit (HOSPITAL_COMMUNITY)
Admission: RE | Admit: 2011-10-20 | Discharge: 2011-10-20 | Disposition: A | Discharge status: Home / Self Care | Payer: Medicare (Managed Care) | Source: Ambulatory Visit | Attending: Vascular Surgery | Admitting: Vascular Surgery

## 2011-10-20 DIAGNOSIS — T82898A Other specified complication of vascular prosthetic devices, implants and grafts, initial encounter: Secondary | ICD-10-CM | POA: Insufficient documentation

## 2011-10-20 DIAGNOSIS — N186 End stage renal disease: Secondary | ICD-10-CM | POA: Insufficient documentation

## 2011-10-20 DIAGNOSIS — I120 Hypertensive chronic kidney disease with stage 5 chronic kidney disease or end stage renal disease: Secondary | ICD-10-CM | POA: Insufficient documentation

## 2011-10-20 DIAGNOSIS — Y849 Medical procedure, unspecified as the cause of abnormal reaction of the patient, or of later complication, without mention of misadventure at the time of the procedure: Secondary | ICD-10-CM | POA: Insufficient documentation

## 2011-10-20 DIAGNOSIS — I2510 Atherosclerotic heart disease of native coronary artery without angina pectoris: Secondary | ICD-10-CM | POA: Insufficient documentation

## 2011-10-20 DIAGNOSIS — I70219 Atherosclerosis of native arteries of extremities with intermittent claudication, unspecified extremity: Secondary | ICD-10-CM

## 2011-10-20 DIAGNOSIS — I70209 Unspecified atherosclerosis of native arteries of extremities, unspecified extremity: Secondary | ICD-10-CM | POA: Insufficient documentation

## 2011-10-20 DIAGNOSIS — Z992 Dependence on renal dialysis: Secondary | ICD-10-CM | POA: Insufficient documentation

## 2011-10-20 DIAGNOSIS — I5032 Chronic diastolic (congestive) heart failure: Secondary | ICD-10-CM | POA: Insufficient documentation

## 2011-10-20 DIAGNOSIS — I509 Heart failure, unspecified: Secondary | ICD-10-CM | POA: Insufficient documentation

## 2011-10-20 DIAGNOSIS — N2581 Secondary hyperparathyroidism of renal origin: Secondary | ICD-10-CM | POA: Insufficient documentation

## 2011-10-20 DIAGNOSIS — E11319 Type 2 diabetes mellitus with unspecified diabetic retinopathy without macular edema: Secondary | ICD-10-CM | POA: Insufficient documentation

## 2011-10-20 DIAGNOSIS — E1139 Type 2 diabetes mellitus with other diabetic ophthalmic complication: Secondary | ICD-10-CM | POA: Insufficient documentation

## 2011-10-20 HISTORY — PX: SHUNTOGRAM: SHX5491

## 2011-10-20 HISTORY — PX: ABDOMINAL AORTAGRAM: SHX5454

## 2011-10-20 LAB — POCT ACTIVATED CLOTTING TIME: Activated Clotting Time: 180 seconds

## 2011-10-20 LAB — GLUCOSE, CAPILLARY: Glucose-Capillary: 126 mg/dL — ABNORMAL HIGH (ref 70–99)

## 2011-10-20 LAB — POCT I-STAT, CHEM 8
Creatinine, Ser: 5.4 mg/dL — ABNORMAL HIGH (ref 0.50–1.10)
Glucose, Bld: 112 mg/dL — ABNORMAL HIGH (ref 70–99)
Hemoglobin: 15.3 g/dL — ABNORMAL HIGH (ref 12.0–15.0)
Sodium: 139 mEq/L (ref 135–145)
TCO2: 28 mmol/L (ref 0–100)

## 2011-10-20 SURGERY — ASSESSMENT, SHUNT FUNCTION, WITH CONTRAST RADIOGRAPHIC STUDY
Anesthesia: LOCAL | Laterality: Right

## 2011-10-20 MED ORDER — HEPARIN SODIUM (PORCINE) 1000 UNIT/ML IJ SOLN
INTRAMUSCULAR | Status: AC
  Filled 2011-10-20: qty 1

## 2011-10-20 MED ORDER — HYDROCODONE-ACETAMINOPHEN 5-325 MG PO TABS
1.0000 | ORAL_TABLET | Freq: Once | ORAL | Status: AC
  Administered 2011-10-20: 1 via ORAL
  Filled 2011-10-20: qty 1

## 2011-10-20 MED ORDER — ACETAMINOPHEN 325 MG PO TABS: 650.0000 mg | ORAL_TABLET | ORAL | Status: DC | PRN

## 2011-10-20 MED ORDER — SODIUM CHLORIDE 0.9 % IJ SOLN: 3.0000 mL | INTRAMUSCULAR | Status: DC | PRN

## 2011-10-20 MED ORDER — ONDANSETRON HCL 4 MG/2ML IJ SOLN: 4.0000 mg | Freq: Four times a day (QID) | INTRAMUSCULAR | Status: DC | PRN

## 2011-10-20 MED ORDER — MORPHINE SULFATE 4 MG/ML IJ SOLN: 2.0000 mg | INTRAMUSCULAR | Status: DC | PRN

## 2011-10-20 MED ORDER — MIDAZOLAM HCL 2 MG/2ML IJ SOLN
INTRAMUSCULAR | Status: AC
  Filled 2011-10-20: qty 2

## 2011-10-20 MED ORDER — SODIUM CHLORIDE 0.9 % IJ SOLN: 3.0000 mL | Freq: Two times a day (BID) | INTRAMUSCULAR | Status: DC

## 2011-10-20 MED ORDER — FENTANYL CITRATE 0.05 MG/ML IJ SOLN
INTRAMUSCULAR | Status: AC
  Filled 2011-10-20: qty 2

## 2011-10-20 MED ORDER — SODIUM CHLORIDE 0.9 % IV SOLN: 250.0000 mL | INTRAVENOUS | Status: DC | PRN

## 2011-10-20 MED ORDER — OXYCODONE-ACETAMINOPHEN 5-325 MG PO TABS: 1.0000 | ORAL_TABLET | ORAL | Status: DC | PRN

## 2011-10-20 MED ORDER — HYDRALAZINE HCL 20 MG/ML IJ SOLN: 10.0000 mg | INTRAMUSCULAR | Status: DC | PRN

## 2011-10-20 MED ORDER — HEPARIN (PORCINE) IN NACL 2-0.9 UNIT/ML-% IJ SOLN
INTRAMUSCULAR | Status: AC
  Filled 2011-10-20: qty 1000

## 2011-10-20 MED ORDER — LIDOCAINE HCL (PF) 1 % IJ SOLN
INTRAMUSCULAR | Status: AC
  Filled 2011-10-20: qty 30

## 2011-10-20 NOTE — Op Note (Addendum)
OPERATIVE NOTE   PROCEDURE: 1.  right brachiocephalic arteriovenous fistula cannulation under ultrasound guidance 2.  right arm fistuloogram 3.  Venoplasty of axillary vein x 3 (4 mm x 40 mm, 6 mm x 60 mm, 8 mm x 60 mm) 4.  Left common femoral artery cannulation under ultrasound guidance 5.  Aortogram  6.  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% stenosis which demonstrated near resolution after serial venoplasty  Widely patent arterial anastomosis  Radiographic innominate vein stenosis, which could not be demonstrated on balloon angioplasty with 8 mm x 60 mm  Aorta: patent  Superior mesenteric artery: patent Celiac artery: patent  Right Left  RA Patent but no nephrogram Patent but no nephrogram  CIA Patent Patent with <30% stenosis  EIA Patent Patent  IIA Patent with >90% stenosis Patent  CFA Patent Patent  SFA Patent Patent  PFA Patent Patent  Pop Patent Patent  Trif Patent Patent but diseased tibioperoneal trunk   AT Patent but diminishes shortly after takeoff Patent (dominant runoff)  Pero Patent (dominant runoff) Patent but diminishes shortly after takeoff  PT Patent but diminishes shortly after takeoff Patent but diminishes shortly after takeoff   SPECIMEN(S):  None  CONTRAST: 140 cc  INDICATIONS: Faith [name] is a 56 y.o. female who presents with malfunctioning right brachiocephalic arteriovenous fistula.  Additionally, the patient has a poorly healing right second toe which may be ischemic.  The patient is scheduled for right arm fistulogram, aortogram, and bilateral leg runoff.  I discussed with the patient the nature of angiographic procedures, especially the limited patencies of any endovascular intervention.  The patient  is aware of that the risks of an angiographic procedure include but are not limited to: bleeding, infection, access site complications, renal failure, embolization, rupture of vessel, dissection, possible need for emergent surgical intervention, possible need for surgical procedures to treat the patient's pathology, and stroke and death.  The patient is aware of the risks and agrees to proceed.  DESCRIPTION: After full informed written consent was obtained, the patient was brought back to the angiography suite and placed supine upon the angiography table.  The patient was connected to monitoring equipment.  The right arm was prepped and draped in the standard fashion for a percutaneous access intervention.  Under ultrasound guidance, the right brachiocephalic arteriovenous fistula was cannulated with a micropuncture needle.  The microwire was advanced into the fistula and the needle was exchanged for the a microsheath, which was lodged 2 cm into the access.  The wire was removed and the sheath was connected to the IV extension tubing.  Hand injections were completed to image the access from the antecubitum up to the level of axilla.  The central venous structures were also imaged by hand injections.  Based on the images, this patient will need: venoplasty.  A Benson wire was advanced into the innominate and the sheath was exchanged for a short 6-Fr sheath.  Based on the the imaging, a 4 mm x 40 mm angioplasty balloon was selected.  The balloon was centered around the stenosis and inflated to 12 atm for 1 minute.  The balloon was deflated and exchanged for a 6 mm x 60 mm balloon.  It was centered around the stenosis and inflated to 18 atm for 1 minute.  On completion imaging, a >30% residual stenosis is present.    The balloon was exchanged for a 8 mm x 60 mm balloon. It was centered around the stenosis and inflated to 18 atm for 1 minute.  I then deflate it to 10 atm and held it for 1 minute.  I did a reflux  injection while inflated, demonstrating no anastomosis stenosis.  I deflated the balloon and inflated it briefly to test the innominate vein.  No waist was identified, suggesting the innominate stenosis on injection is an underfilling finding.  I did a completion injection which demonstrated near resolution of the axillary vein stenosis.  Based on the completion imaging, no further intervention is necessary.  The wire and balloon were removed from the sheath.  A 4-0 Monocryl purse-string suture was sewn around the sheath.  The sheath was removed while tying down the suture.  A sterile bandage was applied to the puncture site.  At this point, attention was turned to the left groin.  It had been previously prepped and draped.  Under ultrasound guidance, I cannulated the left common femoral artery with a 18 gauge needle.  A Benson wire was passed into the aorta and the needle was exchanged for a 5-Fr sheath.  Over the wire, a Omniflush catheter was loaded and advanced to L1.  The catheter was connected to the power injector circuit after de-airring and de-clotting the circuit.  A power injector aortogram was completed, the findings are as listed above.  The catheter was pulled down to the just proximal to the aortic bifurcation, and then a bilateral automated leg runoff was completed.  The findings are as listed above.  Based on the imaging, no interventions are necessary.  I replaced the wire in the catheter and straightened out the crook.  Both were removed together.  The sheath was aspirated.  No clots were present and the sheath was reloaded with heparinized saline.    COMPLICATIONS: none  CONDITION: stable  Leonides Sake, MD [facility] [place] Office: [phone] Pager: [phone]  10/20/2011 8:32 AM

## 2011-10-20 NOTE — H&P (View-Only) (Signed)
[facility] [place] HISTORY AND PHYSICAL   History of Present Illness:  Patient is a 56 y.o. year old female who presents for evaluation of ischemia right second toe.  This has been slowly progressive over the last few weeks. She has mild pain in the toe.  She previously underwent an arteriogram in may of 2012 for similar problem in the left foot. At that time she had in-line flow to the right foot via the peroneal artery. She also had one vessel runoff to the left foot via the anterior tibial artery. She underwent successful left second toe amputation by due to and did not have any wound healing problems. She denies any fever or chills. She has had no drainage from the toe. Other medical problems include end-stage renal disease with dialysis on Monday Wednesday and Friday. She also has a history of coronary artery disease, retinopathy, neuropathy, reflux, prior stroke, prior congestive heart failure. These are all currently controlled.  Past Medical History  Diagnosis Date  . ESRD (end stage renal disease) on dialysis   . Hyperparathyroidism, secondary renal   . CAD (coronary artery disease) of artery bypass graft     CABG in 2002  . PAD (peripheral artery disease)     Not a candidate for revascularization  . Chronic constipation   . GERD (gastroesophageal reflux disease)   . Anemia of chronic renal failure   . Tardive dyskinesia     Secondary to Reglan  . Depression   . Neuropathy   . Diabetic retinopathy   . Diastolic CHF, chronic     Grade 2  . Pulmonary embolism 06/2003  . Hyperlipidemia   . Myocardial infarction 2002  . Stroke 2002    "w/left side" residual weakness  . Dialysis patient 01/17/11    [name]; M, W, F; via right upper arm AVF  . Diabetes mellitus     type II  . CHF (congestive heart failure)   . CVA (cerebral infarction)     right brain with minimal left hemiparesis  . Pneumonia 10/2009; 11/2009    ?pneumonia 01/17/11  . Hypertension     . Arthritis   . Complication of anesthesia     "sleepy afterwards; due to her being dialysis pt"    Past Surgical History  Procedure Date  . Coronary artery bypass graft 2002  . Laparoscopic cholecystectomy 2002  . Av fistula placement     times 4-using rt upper arm graft-old rt lower arm,lt upper and lower old grafts  . Trigger finger release   . Tubal ligation   . Eye examination under anesthesia w/ retinal cryotherapy and retinal laser   . Coronary angioplasty with stent placement   . Cataract extraction 07/2010    right sided   . Toe amputation 5/12    left 2nd toe secondary to osteomyelitis  . Breast biopsy 06/2010  . Carpal tunnel release 05/05/2011    Procedure: CARPAL TUNNEL RELEASE;  Surgeon: Sharma Covert, MD;  Location: [facility];  Service: Orthopedics;  Laterality: Right;     Social History History  Substance Use Topics  . Smoking status: Never Smoker   . Smokeless tobacco: Never Used  . Alcohol Use: No    Family History Family History  Problem Relation Age of Onset  . Diabetes Mother   . Cirrhosis Mother   . Diabetes Father   . Heart attack Father   . Hyperlipidemia Father   . Heart disease Father   .  Diabetes Sister   . Hyperlipidemia Sister   . Hypertension Sister   . Diabetes Sister   . Diabetes Sister   . Heart disease Brother   . Hyperlipidemia Brother     Allergies  Allergies  Allergen Reactions  . Codeine Other (See Comments)    unknown  . Lipitor (Atorvastatin Calcium) Other (See Comments)    unknown  . Metoclopramide Hcl Other (See Comments)    unknown     Current Outpatient Prescriptions  Medication Sig Dispense Refill  . acetaminophen (TYLENOL ARTHRITIS PAIN) 650 MG CR tablet Take 650 mg by mouth every 8 (eight) hours as needed. For pain      . amLODipine (NORVASC) 5 MG tablet Take 5 mg by mouth at bedtime.       Marland Kitchen aspirin 81 MG chewable tablet Chew 81 mg by mouth daily.        . cinacalcet (SENSIPAR) 30 MG  tablet Take 30 mg by mouth daily.      . cinacalcet (SENSIPAR) 90 MG tablet Take 90 mg by mouth every evening.       . fenofibrate micronized (LOFIBRA) 134 MG capsule Take 134 mg by mouth daily.       Marland Kitchen gabapentin (NEURONTIN) 400 MG capsule Take 400 mg by mouth at bedtime.       Marland Kitchen glimepiride (AMARYL) 4 MG tablet Take 4 mg by mouth every morning.        Marland Kitchen HYDROcodone-acetaminophen (VICODIN) 5-500 MG per tablet Take 1 tablet by mouth 2 (two) times daily.        . insulin aspart (NOVOLOG) 100 UNIT/ML injection Inject into the skin 3 (three) times daily before meals. Sliding scale      . insulin glargine (LANTUS) 100 UNIT/ML injection Inject 6 Units into the skin at bedtime.       . multivitamin (RENA-VIT) TABS tablet Take 1 tablet by mouth daily.        . pantoprazole (PROTONIX) 40 MG tablet Take 40 mg by mouth at bedtime.       . polyvinyl alcohol (LIQUIFILM TEARS) 1.4 % ophthalmic solution Place 1 drop into both eyes daily as needed. For dryness       . sevelamer (RENAGEL) 800 MG tablet Take 1,600-3,200 mg by mouth 3 (three) times daily with meals.       . promethazine (PHENERGAN) 25 MG tablet Take 25 mg by mouth every 6 (six) hours as needed. For nausea         ROS:   General:  No weight loss, Fever, chills  HEENT: No recent headaches, no nasal bleeding, no visual changes, no sore throat  Neurologic: No dizziness, blackouts, seizures. No recent symptoms of stroke or mini- stroke. No recent episodes of slurred speech, or temporary blindness.  Cardiac: No recent episodes of chest pain/pressure, no shortness of breath at rest.  No shortness of breath with exertion.  Denies history of atrial fibrillation or irregular heartbeat  Vascular: No history of rest pain in feet.  No history of claudication.  No history of non-healing ulcer, No history of DVT   Pulmonary: No home oxygen, no productive cough, no hemoptysis,  No asthma or wheezing  Musculoskeletal:  [ ]  Arthritis, [ ]  Low back pain,  [  ] Joint pain  Hematologic:No history of hypercoagulable state.  No history of easy bleeding.  No history of anemia  Gastrointestinal: No hematochezia or melena,  No gastroesophageal reflux, no trouble swallowing  Urinary: [ ]  chronic Kidney  disease, [ ]  on HD - [x ] MWF or [ ]  TTHS, [ ]  Burning with urination, [ ]  Frequent urination, [ ]  Difficulty urinating;   Skin: No rashes  Psychological: No history of anxiety,  No history of depression   Physical Examination  Filed Vitals:   10/13/11 1558  BP: 154/67  Pulse: 90  Resp: 18  Height: 5' 4.5" (1.638 m)  Weight: 181 lb (82.101 kg)    Body mass index is 30.59 kg/(m^2).  General:  Alert and oriented, no acute distress HEENT: Normal Neck: No bruit or JVD Pulmonary: Clear to auscultation bilaterally Cardiac: Regular Rate and Rhythm Abdomen: Soft, non-tender, non-distended, no mass Skin: No rash Extremity Pulses:  2+ radial, brachial, femoral, absent dorsalis pedis, posterior tibial pulses bilaterally Musculoskeletal: No deformity or edema, dry gangrene right second toe, distal and middle phalanx  Neurologic: Upper and lower extremity motor 5/5 and symmetric  DATA:   The patient had bilateral ABIs today which are reviewed and interpreted. She had non-compressible calcified vessels. Digit pressure on the right foot was 54. Digit pressure on the left side was 154.   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 The patient had in line flow via the peroneal artery on arteriogram approximately 15 months ago. However I believe it has been long enough that we should repeat her arteriogram to make sure that the peroneal artery has not become further disease prior to proceeding with amputation. The patient was originally scheduled to have a shuntogram and interventional radiology on September 29. However, since she needs an arteriogram I have scheduled her to have her shuntogram  with my partner Dr. Imogene Burn who will also perform her lower extremity arteriogram at the same procedure. Risks benefits possible complications procedure details of the arteriogram as well as shuntogram and possible intervention were explained the patient today. If she has in line flow to the right foot L. schedule her for a second toe indication the near future.   PLAN:  See above  Fabienne Bruns, MD [facility] [place] Office: [phone] Pager: [phone]

## 2011-10-20 NOTE — Interval H&P Note (Signed)
[facility] [place]  History and Physical Update  The patient was interviewed and re-examined.  The patient's previous History and Physical has been reviewed and is unchanged.  There is no change in the plan of care: aortogram, bilateral leg runoff, right arm shuntogram, possible intervention.  Leonides Sake, MD [facility] [place] Office: [phone] Pager: [phone]  10/20/2011, 7:21 AM

## 2011-10-20 NOTE — Progress Notes (Signed)
UP AND WALKED AND TOL WELL LEFT GROIN STABLE NO BLEEDING OR HEMATOMA 

## 2011-10-20 NOTE — Telephone Encounter (Addendum)
Message copied by Rosalyn Charters on Thu Oct 20, 2011 12:51 PM ------      Message from: Melene Plan      Created: Thu Oct 20, 2011 12:22 PM                   ----- Message -----         From: Fransisco Hertz, MD         Sent: 10/20/2011   9:09 AM           To: Reuel Derby, Melene Plan, RN            Faith [name]      [id number]      03-Mar-1955            PROCEDURE:      1.  right brachiocephalic arteriovenous fistula cannulation under ultrasound guidance      2.  right arm fistuloogram      3.  Venoplasty of axillary vein x 3 (4 mm x 40 mm, 6 mm x 60 mm, 8 mm x 60 mm)      4.  Left common femoral artery cannulation under ultrasound guidance      5.  Aortogram       6.  Bilateral leg runoff            Follow-up: Dr. Darrick Penna 2-4 weeks            notified patient of fu appt. with dr. Darrick Penna on 11-10-11 3:45 pm l/vm and mailed appt. letter

## 2011-10-25 ENCOUNTER — Ambulatory Visit (HOSPITAL_COMMUNITY)
Admission: RE | Admit: 2011-10-25 | Discharge: 2011-10-25 | Disposition: A | Discharge status: Home / Self Care | Payer: Medicare (Managed Care) | Source: Ambulatory Visit | Attending: Family Medicine | Admitting: Family Medicine

## 2011-10-25 DIAGNOSIS — R1313 Dysphagia, pharyngeal phase: Secondary | ICD-10-CM | POA: Insufficient documentation

## 2011-10-25 DIAGNOSIS — I509 Heart failure, unspecified: Secondary | ICD-10-CM | POA: Insufficient documentation

## 2011-10-25 DIAGNOSIS — J40 Bronchitis, not specified as acute or chronic: Secondary | ICD-10-CM

## 2011-10-25 DIAGNOSIS — I2510 Atherosclerotic heart disease of native coronary artery without angina pectoris: Secondary | ICD-10-CM | POA: Insufficient documentation

## 2011-10-25 DIAGNOSIS — R1319 Other dysphagia: Secondary | ICD-10-CM | POA: Insufficient documentation

## 2011-10-25 DIAGNOSIS — E119 Type 2 diabetes mellitus without complications: Secondary | ICD-10-CM | POA: Insufficient documentation

## 2011-10-25 DIAGNOSIS — I120 Hypertensive chronic kidney disease with stage 5 chronic kidney disease or end stage renal disease: Secondary | ICD-10-CM | POA: Insufficient documentation

## 2011-10-25 DIAGNOSIS — R1312 Dysphagia, oropharyngeal phase: Secondary | ICD-10-CM | POA: Insufficient documentation

## 2011-10-25 DIAGNOSIS — N186 End stage renal disease: Secondary | ICD-10-CM | POA: Insufficient documentation

## 2011-10-25 DIAGNOSIS — K219 Gastro-esophageal reflux disease without esophagitis: Secondary | ICD-10-CM | POA: Insufficient documentation

## 2011-10-25 NOTE — Procedures (Signed)
Objective Swallowing Evaluation: Modified Barium Swallowing Study  Patient Details  Name: Faith [name] MRN: [id number] Date of Birth: 05-04-1955  Today's Date: 10/25/2011 Time: 7829-5621 SLP Time Calculation (min): 30 min  Past Medical History:  Past Medical History  Diagnosis Date  . ESRD (end stage renal disease) on dialysis   . Hyperparathyroidism, secondary renal   . CAD (coronary artery disease) of artery bypass graft     CABG in 2002  . PAD (peripheral artery disease)     Not a candidate for revascularization  . Chronic constipation   . GERD (gastroesophageal reflux disease)   . Anemia of chronic renal failure   . Tardive dyskinesia     Secondary to Reglan  . Depression   . Neuropathy   . Diabetic retinopathy   . Diastolic CHF, chronic     Grade 2  . Pulmonary embolism 06/2003  . Hyperlipidemia   . Myocardial infarction 2002  . Stroke 2002    "w/left side" residual weakness  . Dialysis patient 01/17/11    [name]; M, W, F; via right upper arm AVF  . Diabetes mellitus     type II  . CHF (congestive heart failure)   . CVA (cerebral infarction)     right brain with minimal left hemiparesis  . Pneumonia 10/2009; 11/2009    ?pneumonia 01/17/11  . Hypertension   . Arthritis   . Complication of anesthesia     "sleepy afterwards; due to her being dialysis pt"   Past Surgical History:  Past Surgical History  Procedure Date  . Coronary artery bypass graft 2002  . Laparoscopic cholecystectomy 2002  . Av fistula placement     times 4-using rt upper arm graft-old rt lower arm,lt upper and lower old grafts  . Trigger finger release   . Tubal ligation   . Eye examination under anesthesia w/ retinal cryotherapy and retinal laser   . Coronary angioplasty with stent placement   . Cataract extraction 07/2010    right sided   . Toe amputation 5/12    left 2nd toe secondary to osteomyelitis  . Breast biopsy 06/2010  . Carpal tunnel release 05/05/2011    Procedure:  CARPAL TUNNEL RELEASE;  Surgeon: Sharma Covert, MD;  Location: [facility];  Service: Orthopedics;  Laterality: Right;   HPI:  56 yr old seen at [facility] for outpatient MBS with c/o waking up at night and vomiting and during meals on several occasions.  History of two hospital admissons last year for pna.  Also history of GERD, ESRD, MI, PE, CVA 2002, tardive dyskinesia due to Reglan.  She denies coughing after swallowing po's.  No recent weight loss.     Assessment / Plan / Recommendation Clinical Impression  Dysphagia Diagnosis: Suspected primary esophageal dysphagia;Mild pharyngeal phase dysphagia Clinical impression: Pt. exhibited minimal oral dysphagia with decreased mastication and mildly delayed oral transit due to absent bottom dentition.  Pharyngeal phase is mildy impaired due to decreased sensation leading to delayed swallow initiation and flash penetration (barium entered laryngeal vestibule, however exited during the swallow) with  thin liquid.  A primary esophageal dysphagia is highly suspected as residual seen throughout esophagus with incomplete clearance.  This coincides with pt's description of emesis at night and during meals.  Recommend she continue a regular diet texture and thin liquids with reflux precautions that SLP reviewed with her.  Would she benefit from a change in her reflux medication and a possible GI consult?  Treatment Recommendation  No treatment recommended at this time    Diet Recommendation Regular;Thin liquid   Liquid Administration via: Cup;Straw Medication Administration: Whole meds with liquid Supervision: Patient able to self feed Compensations: Slow rate;Small sips/bites;Follow solids with liquid (take breaks for esophageal clearance) Postural Changes and/or Swallow Maneuvers: Seated upright 90 degrees;Upright 30-60 min after meal;Out of bed for meals    Other  Recommendations Recommended Consults: Consider GI evaluation Oral  Care Recommendations: Oral care BID   Follow Up Recommendations  None    Frequency and Duration                    Reason for Referral Objectively evaluate swallowing function   Oral Phase     Pharyngeal Phase Pharyngeal Phase: Impaired   Cervical Esophageal Phase     Functional Assessment Tool Used: clinical judgement Functional Limitations: Swallowing Swallow Current Status (J4782): 0 percent impaired, limited or restricted Swallow Goal Status (N5621): 0 percent impaired, limited or restricted Swallow Discharge Status (H0865): 0 percent impaired, limited or restricted  Cervical Esophageal Phase: [facility]    Darrow Bussing.Ed [employer] [phone]  10/25/2011

## 2011-11-09 ENCOUNTER — Encounter: Payer: Self-pay | Admitting: Vascular Surgery

## 2011-11-10 ENCOUNTER — Ambulatory Visit (INDEPENDENT_AMBULATORY_CARE_PROVIDER_SITE_OTHER): Payer: Medicare Other | Admitting: Vascular Surgery

## 2011-11-10 ENCOUNTER — Encounter: Payer: Self-pay | Admitting: Vascular Surgery

## 2011-11-10 VITALS — BP 150/70 | HR 81 | Temp 98.4°F | Ht 64.5 in | Wt 180.0 lb

## 2011-11-10 DIAGNOSIS — N186 End stage renal disease: Secondary | ICD-10-CM

## 2011-11-10 NOTE — Progress Notes (Signed)
The patient is a 56 year old female who I recently saw for ischemia of her right second toe. She underwent arteriogram on partner Dr. Imogene Burn a few weeks ago. This shows that she has in line flow with peroneal runoff to the right foot. She also has in-line flow with anterior tibial artery runoff to the left foot. She was able to heal a prior left toe amputation. I believe she has adequate perfusion to heal a right toe amputation based on her arteriogram. She states that the toes not really causing her pain but continues to drain and has gotten darker. She has an appointment scheduled with Dr. Lajoyce Corners in 5 days for further evaluation of bone infection in the toe. As far as her right arm fistula is concerned she has an easily palpable thrill in this and it is working well on dialysis.  Review of systems: She denies chest pain. She denies shortness of breath.  Physical exam: Filed Vitals:   11/10/11 1548  BP: 150/70  Pulse: 81  Temp: 98.4 F (36.9 C)  TempSrc: Oral  Height: 5' 4.5" (1.638 m)  Weight: 180 lb (81.647 kg)  SpO2: 80%   Right foot second toe dark at the tip with some bloody drainage from the mid shaft No palpable pulses right foot  Assessment: Early gangrenous changes right second toe with most likely osteomyelitis  Plan: The patient should have adequate perfusion to heal a right second toe amputation. She will see Dr. Lajoyce Corners in 5 days to consider this. She will followup with [place] on an as-needed basis. Fabienne Bruns, MD [facility] [place] Office: [phone] Pager: [phone]

## 2011-11-21 ENCOUNTER — Other Ambulatory Visit (HOSPITAL_COMMUNITY): Payer: Self-pay | Admitting: Orthopedic Surgery

## 2011-11-21 ENCOUNTER — Encounter (HOSPITAL_COMMUNITY): Payer: Self-pay | Admitting: Pharmacist

## 2011-11-23 ENCOUNTER — Encounter (HOSPITAL_COMMUNITY): Payer: Self-pay | Admitting: *Deleted

## 2011-11-23 MED ORDER — CEFAZOLIN SODIUM-DEXTROSE 2-3 GM-% IV SOLR
2.0000 g | INTRAVENOUS | Status: AC
  Administered 2011-11-24: 2 g via INTRAVENOUS
  Filled 2011-11-23: qty 50

## 2011-11-24 ENCOUNTER — Encounter (HOSPITAL_COMMUNITY): Payer: Self-pay | Admitting: Anesthesiology

## 2011-11-24 ENCOUNTER — Ambulatory Visit (HOSPITAL_COMMUNITY): Payer: Medicare (Managed Care) | Admitting: Anesthesiology

## 2011-11-24 ENCOUNTER — Encounter (HOSPITAL_COMMUNITY)
Admission: RE | Disposition: A | Discharge status: Home / Self Care | Payer: Self-pay | Source: Ambulatory Visit | Attending: Orthopedic Surgery

## 2011-11-24 ENCOUNTER — Ambulatory Visit (HOSPITAL_COMMUNITY)
Admission: RE | Admit: 2011-11-24 | Discharge: 2011-11-25 | Disposition: A | Discharge status: Home / Self Care | Payer: Medicare (Managed Care) | Source: Ambulatory Visit | Attending: Orthopedic Surgery | Admitting: Orthopedic Surgery

## 2011-11-24 ENCOUNTER — Ambulatory Visit (HOSPITAL_COMMUNITY): Payer: Medicare (Managed Care)

## 2011-11-24 ENCOUNTER — Encounter (HOSPITAL_COMMUNITY): Payer: Self-pay | Admitting: *Deleted

## 2011-11-24 DIAGNOSIS — E1142 Type 2 diabetes mellitus with diabetic polyneuropathy: Secondary | ICD-10-CM | POA: Insufficient documentation

## 2011-11-24 DIAGNOSIS — I509 Heart failure, unspecified: Secondary | ICD-10-CM | POA: Insufficient documentation

## 2011-11-24 DIAGNOSIS — E1149 Type 2 diabetes mellitus with other diabetic neurological complication: Secondary | ICD-10-CM | POA: Insufficient documentation

## 2011-11-24 DIAGNOSIS — I69959 Hemiplegia and hemiparesis following unspecified cerebrovascular disease affecting unspecified side: Secondary | ICD-10-CM | POA: Insufficient documentation

## 2011-11-24 DIAGNOSIS — I5032 Chronic diastolic (congestive) heart failure: Secondary | ICD-10-CM | POA: Insufficient documentation

## 2011-11-24 DIAGNOSIS — M86179 Other acute osteomyelitis, unspecified ankle and foot: Secondary | ICD-10-CM | POA: Insufficient documentation

## 2011-11-24 DIAGNOSIS — D631 Anemia in chronic kidney disease: Secondary | ICD-10-CM | POA: Insufficient documentation

## 2011-11-24 DIAGNOSIS — E1139 Type 2 diabetes mellitus with other diabetic ophthalmic complication: Secondary | ICD-10-CM | POA: Insufficient documentation

## 2011-11-24 DIAGNOSIS — M869 Osteomyelitis, unspecified: Secondary | ICD-10-CM | POA: Diagnosis present

## 2011-11-24 DIAGNOSIS — E1169 Type 2 diabetes mellitus with other specified complication: Secondary | ICD-10-CM | POA: Insufficient documentation

## 2011-11-24 DIAGNOSIS — I120 Hypertensive chronic kidney disease with stage 5 chronic kidney disease or end stage renal disease: Secondary | ICD-10-CM | POA: Insufficient documentation

## 2011-11-24 DIAGNOSIS — E11319 Type 2 diabetes mellitus with unspecified diabetic retinopathy without macular edema: Secondary | ICD-10-CM | POA: Insufficient documentation

## 2011-11-24 DIAGNOSIS — I2510 Atherosclerotic heart disease of native coronary artery without angina pectoris: Secondary | ICD-10-CM | POA: Insufficient documentation

## 2011-11-24 DIAGNOSIS — N2581 Secondary hyperparathyroidism of renal origin: Secondary | ICD-10-CM | POA: Insufficient documentation

## 2011-11-24 DIAGNOSIS — N186 End stage renal disease: Secondary | ICD-10-CM | POA: Insufficient documentation

## 2011-11-24 DIAGNOSIS — Z992 Dependence on renal dialysis: Secondary | ICD-10-CM | POA: Insufficient documentation

## 2011-11-24 DIAGNOSIS — F430 Acute stress reaction: Secondary | ICD-10-CM | POA: Insufficient documentation

## 2011-11-24 DIAGNOSIS — M9080 Osteopathy in diseases classified elsewhere, unspecified site: Secondary | ICD-10-CM | POA: Insufficient documentation

## 2011-11-24 DIAGNOSIS — E785 Hyperlipidemia, unspecified: Secondary | ICD-10-CM | POA: Insufficient documentation

## 2011-11-24 HISTORY — PX: AMPUTATION: SHX166

## 2011-11-24 LAB — CBC
HCT: 36.5 % (ref 36.0–46.0)
Hemoglobin: 11.6 g/dL — ABNORMAL LOW (ref 12.0–15.0)
MCHC: 31.8 g/dL (ref 30.0–36.0)
MCV: 83.9 fL (ref 78.0–100.0)
RDW: 15.6 % — ABNORMAL HIGH (ref 11.5–15.5)
WBC: 8.2 10*3/uL (ref 4.0–10.5)

## 2011-11-24 LAB — COMPREHENSIVE METABOLIC PANEL
Albumin: 3.9 g/dL (ref 3.5–5.2)
BUN: 22 mg/dL (ref 6–23)
Chloride: 93 mEq/L — ABNORMAL LOW (ref 96–112)
Creatinine, Ser: 5.29 mg/dL — ABNORMAL HIGH (ref 0.50–1.10)
GFR calc Af Amer: 9 mL/min — ABNORMAL LOW (ref >90)
Glucose, Bld: 88 mg/dL (ref 70–99)
Total Bilirubin: 0.7 mg/dL (ref 0.3–1.2)

## 2011-11-24 LAB — PROTIME-INR
INR: 1.32 (ref 0.00–1.49)
Prothrombin Time: 16.1 seconds — ABNORMAL HIGH (ref 11.6–15.2)

## 2011-11-24 LAB — GLUCOSE, CAPILLARY

## 2011-11-24 LAB — SURGICAL PCR SCREEN: Staphylococcus aureus: NEGATIVE

## 2011-11-24 SURGERY — AMPUTATION, FOOT, RAY
Anesthesia: Monitor Anesthesia Care | Site: Foot | Laterality: Right | Wound class: Clean

## 2011-11-24 MED ORDER — HYDROMORPHONE HCL PF 1 MG/ML IJ SOLN: 0.2500 mg | INTRAMUSCULAR | Status: DC | PRN

## 2011-11-24 MED ORDER — HYDROCODONE-ACETAMINOPHEN 5-325 MG PO TABS
1.0000 | ORAL_TABLET | ORAL | Status: DC | PRN
  Administered 2011-11-24 – 2011-11-25 (×4): 1 via ORAL
  Filled 2011-11-24 (×4): qty 1

## 2011-11-24 MED ORDER — MIDAZOLAM HCL 2 MG/2ML IJ SOLN: 0.5000 mg | Freq: Once | INTRAMUSCULAR | Status: DC | PRN

## 2011-11-24 MED ORDER — HYDROMORPHONE HCL PF 1 MG/ML IJ SOLN: 0.5000 mg | INTRAMUSCULAR | Status: DC | PRN

## 2011-11-24 MED ORDER — PROMETHAZINE HCL 25 MG/ML IJ SOLN: 6.2500 mg | INTRAMUSCULAR | Status: DC | PRN

## 2011-11-24 MED ORDER — MIDAZOLAM HCL 5 MG/5ML IJ SOLN
INTRAMUSCULAR | Status: DC | PRN
  Administered 2011-11-24: 1 mg via INTRAVENOUS

## 2011-11-24 MED ORDER — ONDANSETRON HCL 4 MG PO TABS: 4.0000 mg | ORAL_TABLET | Freq: Four times a day (QID) | ORAL | Status: DC | PRN

## 2011-11-24 MED ORDER — DEXTROSE 50 % IV SOLN
INTRAVENOUS | Status: AC
  Administered 2011-11-24: 25 mL via INTRAVENOUS
  Filled 2011-11-24: qty 50

## 2011-11-24 MED ORDER — NAPHAZOLINE HCL 0.1 % OP SOLN
1.0000 [drp] | Freq: Four times a day (QID) | OPHTHALMIC | Status: DC | PRN
  Filled 2011-11-24: qty 15

## 2011-11-24 MED ORDER — PARICALCITOL 5 MCG/ML IV SOLN
7.0000 ug | INTRAVENOUS | Status: DC
  Administered 2011-11-25: 7 ug via INTRAVENOUS
  Filled 2011-11-24: qty 1.4

## 2011-11-24 MED ORDER — SODIUM CHLORIDE 0.9 % IV SOLN: INTRAVENOUS | Status: DC

## 2011-11-24 MED ORDER — FENTANYL CITRATE 0.05 MG/ML IJ SOLN
INTRAMUSCULAR | Status: DC | PRN
  Administered 2011-11-24 (×2): 50 ug via INTRAVENOUS

## 2011-11-24 MED ORDER — POLYVINYL ALCOHOL 1.4 % OP SOLN
1.0000 [drp] | Freq: Every day | OPHTHALMIC | Status: DC | PRN
  Filled 2011-11-24: qty 15

## 2011-11-24 MED ORDER — ONDANSETRON HCL 4 MG/2ML IJ SOLN: 4.0000 mg | Freq: Four times a day (QID) | INTRAMUSCULAR | Status: DC | PRN

## 2011-11-24 MED ORDER — GABAPENTIN 400 MG PO CAPS
400.0000 mg | ORAL_CAPSULE | Freq: Every day | ORAL | Status: DC
  Administered 2011-11-24: 400 mg via ORAL
  Filled 2011-11-24 (×2): qty 1

## 2011-11-24 MED ORDER — MUPIROCIN 2 % EX OINT
TOPICAL_OINTMENT | Freq: Two times a day (BID) | CUTANEOUS | Status: DC
  Administered 2011-11-24: 1 via NASAL
  Filled 2011-11-24: qty 22

## 2011-11-24 MED ORDER — ASPIRIN EC 81 MG PO TBEC
81.0000 mg | DELAYED_RELEASE_TABLET | Freq: Every day | ORAL | Status: DC
  Administered 2011-11-24 – 2011-11-25 (×2): 81 mg via ORAL
  Filled 2011-11-24 (×2): qty 1

## 2011-11-24 MED ORDER — METOCLOPRAMIDE HCL 5 MG PO TABS
5.0000 mg | ORAL_TABLET | Freq: Three times a day (TID) | ORAL | Status: DC | PRN
  Filled 2011-11-24: qty 2

## 2011-11-24 MED ORDER — PROPOFOL INFUSION 10 MG/ML OPTIME
INTRAVENOUS | Status: DC | PRN
  Administered 2011-11-24: 50 ug/kg/min via INTRAVENOUS

## 2011-11-24 MED ORDER — BUPIVACAINE HCL (PF) 0.5 % IJ SOLN
INTRAMUSCULAR | Status: DC | PRN
  Administered 2011-11-24: 35 mL

## 2011-11-24 MED ORDER — INSULIN GLARGINE 100 UNIT/ML ~~LOC~~ SOLN
6.0000 [IU] | Freq: Every day | SUBCUTANEOUS | Status: DC
  Administered 2011-11-24: 6 [IU] via SUBCUTANEOUS

## 2011-11-24 MED ORDER — RENA-VITE PO TABS
1.0000 | ORAL_TABLET | Freq: Every day | ORAL | Status: DC
  Filled 2011-11-24: qty 1

## 2011-11-24 MED ORDER — CINACALCET HCL 30 MG PO TABS
30.0000 mg | ORAL_TABLET | Freq: Every day | ORAL | Status: DC
Start: 2011-11-24 — End: 2011-11-25
  Administered 2011-11-25: 30 mg via ORAL
  Filled 2011-11-24 (×2): qty 1

## 2011-11-24 MED ORDER — CINACALCET HCL 30 MG PO TABS
90.0000 mg | ORAL_TABLET | Freq: Every evening | ORAL | Status: DC
  Administered 2011-11-24 – 2011-11-25 (×2): 90 mg via ORAL
  Filled 2011-11-24 (×2): qty 3

## 2011-11-24 MED ORDER — AMLODIPINE BESYLATE 5 MG PO TABS
5.0000 mg | ORAL_TABLET | Freq: Every day | ORAL | Status: DC
  Administered 2011-11-24: 5 mg via ORAL
  Filled 2011-11-24 (×2): qty 1

## 2011-11-24 MED ORDER — ALBUTEROL SULFATE HFA 108 (90 BASE) MCG/ACT IN AERS
2.0000 | INHALATION_SPRAY | RESPIRATORY_TRACT | Status: DC | PRN
  Filled 2011-11-24: qty 6.7

## 2011-11-24 MED ORDER — PANTOPRAZOLE SODIUM 40 MG PO TBEC
40.0000 mg | DELAYED_RELEASE_TABLET | Freq: Every day | ORAL | Status: DC
  Administered 2011-11-24: 40 mg via ORAL
  Filled 2011-11-24: qty 1

## 2011-11-24 MED ORDER — SODIUM CHLORIDE 0.9 % IV SOLN
INTRAVENOUS | Status: DC | PRN
  Administered 2011-11-24: 13:00:00 via INTRAVENOUS

## 2011-11-24 MED ORDER — NITROGLYCERIN 0.4 MG SL SUBL: 0.4000 mg | SUBLINGUAL_TABLET | SUBLINGUAL | Status: DC | PRN

## 2011-11-24 MED ORDER — SODIUM CHLORIDE 0.9 % IV SOLN
INTRAVENOUS | Status: DC
  Administered 2011-11-24: 10 mL/h via INTRAVENOUS

## 2011-11-24 MED ORDER — EZETIMIBE 10 MG PO TABS
10.0000 mg | ORAL_TABLET | Freq: Every day | ORAL | Status: DC
  Administered 2011-11-24 – 2011-11-25 (×2): 10 mg via ORAL
  Filled 2011-11-24 (×2): qty 1

## 2011-11-24 MED ORDER — SEVELAMER CARBONATE 800 MG PO TABS
800.0000 mg | ORAL_TABLET | Freq: Three times a day (TID) | ORAL | Status: DC
  Administered 2011-11-24 – 2011-11-25 (×2): 800 mg via ORAL
  Filled 2011-11-24 (×5): qty 1

## 2011-11-24 MED ORDER — OXYCODONE-ACETAMINOPHEN 5-325 MG PO TABS: 1.0000 | ORAL_TABLET | ORAL | Status: DC | PRN

## 2011-11-24 MED ORDER — FENOFIBRATE 54 MG PO TABS
54.0000 mg | ORAL_TABLET | Freq: Every day | ORAL | Status: DC
Start: 2011-11-25 — End: 2011-11-25
  Administered 2011-11-25: 54 mg via ORAL
  Filled 2011-11-24: qty 1

## 2011-11-24 MED ORDER — 0.9 % SODIUM CHLORIDE (POUR BTL) OPTIME
TOPICAL | Status: DC | PRN
  Administered 2011-11-24: 1000 mL

## 2011-11-24 MED ORDER — ONDANSETRON HCL 4 MG/2ML IJ SOLN
INTRAMUSCULAR | Status: DC | PRN
  Administered 2011-11-24: 4 mg via INTRAVENOUS

## 2011-11-24 MED ORDER — MEPERIDINE HCL 25 MG/ML IJ SOLN: 6.2500 mg | INTRAMUSCULAR | Status: DC | PRN

## 2011-11-24 MED ORDER — INSULIN ASPART 100 UNIT/ML ~~LOC~~ SOLN: 4.0000 [IU] | Freq: Three times a day (TID) | SUBCUTANEOUS | Status: DC

## 2011-11-24 MED ORDER — METOCLOPRAMIDE HCL 5 MG/ML IJ SOLN
5.0000 mg | Freq: Three times a day (TID) | INTRAMUSCULAR | Status: DC | PRN
  Filled 2011-11-24: qty 2

## 2011-11-24 SURGICAL SUPPLY — 41 items
BANDAGE ESMARK 6X9 LF (GAUZE/BANDAGES/DRESSINGS) IMPLANT
BLADE SAW SGTL MED 73X18.5 STR (BLADE) ×2 IMPLANT
BNDG COHESIVE 4X5 TAN STRL (GAUZE/BANDAGES/DRESSINGS) ×2 IMPLANT
BNDG COHESIVE 6X5 TAN STRL LF (GAUZE/BANDAGES/DRESSINGS) IMPLANT
BNDG ESMARK 6X9 LF (GAUZE/BANDAGES/DRESSINGS)
BNDG GAUZE STRTCH 6 (GAUZE/BANDAGES/DRESSINGS) ×2 IMPLANT
CLOTH BEACON ORANGE TIMEOUT ST (SAFETY) ×2 IMPLANT
CUFF TOURNIQUET SINGLE 34IN LL (TOURNIQUET CUFF) IMPLANT
CUFF TOURNIQUET SINGLE 44IN (TOURNIQUET CUFF) IMPLANT
DRAPE U-SHAPE 47X51 STRL (DRAPES) ×2 IMPLANT
DRSG ADAPTIC 3X8 NADH LF (GAUZE/BANDAGES/DRESSINGS) ×2 IMPLANT
DRSG PAD ABDOMINAL 8X10 ST (GAUZE/BANDAGES/DRESSINGS) ×2 IMPLANT
DURAPREP 26ML APPLICATOR (WOUND CARE) ×2 IMPLANT
ELECT REM PT RETURN 9FT ADLT (ELECTROSURGICAL) ×2
ELECTRODE REM PT RTRN 9FT ADLT (ELECTROSURGICAL) ×1 IMPLANT
GLOVE BIOGEL PI IND STRL 9 (GLOVE) ×1 IMPLANT
GLOVE BIOGEL PI INDICATOR 9 (GLOVE) ×1
GLOVE ECLIPSE 6.5 STRL STRAW (GLOVE) ×2 IMPLANT
GLOVE SURG ORTHO 9.0 STRL STRW (GLOVE) ×2 IMPLANT
GOWN PREVENTION PLUS XLARGE (GOWN DISPOSABLE) ×2 IMPLANT
GOWN SRG XL XLNG 56XLVL 4 (GOWN DISPOSABLE) ×1 IMPLANT
GOWN STRL NON-REIN XL XLG LVL4 (GOWN DISPOSABLE) ×1
KIT BASIN OR (CUSTOM PROCEDURE TRAY) ×2 IMPLANT
KIT ROOM TURNOVER OR (KITS) ×2 IMPLANT
MANIFOLD NEPTUNE II (INSTRUMENTS) IMPLANT
NS IRRIG 1000ML POUR BTL (IV SOLUTION) ×2 IMPLANT
PACK ORTHO EXTREMITY (CUSTOM PROCEDURE TRAY) ×2 IMPLANT
PAD ARMBOARD 7.5X6 YLW CONV (MISCELLANEOUS) ×4 IMPLANT
PAD CAST 4YDX4 CTTN HI CHSV (CAST SUPPLIES) IMPLANT
PADDING CAST COTTON 4X4 STRL (CAST SUPPLIES)
SPONGE GAUZE 4X4 12PLY (GAUZE/BANDAGES/DRESSINGS) ×2 IMPLANT
SPONGE LAP 18X18 X RAY DECT (DISPOSABLE) IMPLANT
STAPLER VISISTAT 35W (STAPLE) IMPLANT
STOCKINETTE IMPERVIOUS LG (DRAPES) ×2 IMPLANT
SUCTION FRAZIER TIP 10 FR DISP (SUCTIONS) ×2 IMPLANT
SUT ETHILON 2 0 PSLX (SUTURE) ×4 IMPLANT
TOWEL OR 17X24 6PK STRL BLUE (TOWEL DISPOSABLE) ×2 IMPLANT
TOWEL OR 17X26 10 PK STRL BLUE (TOWEL DISPOSABLE) ×2 IMPLANT
TUBE CONNECTING 12X1/4 (SUCTIONS) ×2 IMPLANT
UNDERPAD 30X30 INCONTINENT (UNDERPADS AND DIAPERS) ×2 IMPLANT
WATER STERILE IRR 1000ML POUR (IV SOLUTION) IMPLANT

## 2011-11-24 NOTE — Progress Notes (Signed)
Orthopedic Tech Progress Note Patient Details:  Faith [name] January 03, 1956 [id number]  Ortho Devices Type of Ortho Device: Postop boot Ortho Device/Splint Location: right foot Ortho Device/Splint Interventions: Application   [name] 11/24/2011, 4:55 PM

## 2011-11-24 NOTE — Consult Note (Addendum)
I have seen and examined this patient and agree with plan as outlined by Gerome Apley, PA-C.  Pt is s/p amputation of 2nd right toe and doing well.  Will cont with her outpt HD schedule of MWF.  tight heparin with HD and f/u with outpt HD when stable for d/c. Zeidy Tayag A,MD 11/24/2011 4:49 PM

## 2011-11-24 NOTE — Anesthesia Postprocedure Evaluation (Signed)
  Anesthesia Post-op Note  Patient: Faith [name]  Procedure(s) Performed: Procedure(s) (LRB) with comments: AMPUTATION RAY (Right) - Right Foot 2nd Ray Amputation  Patient Location: PACU  Anesthesia Type: MAC combined with regional for post-op pain  Level of Consciousness: awake, alert , oriented and patient cooperative  Airway and Oxygen Therapy: Patient Spontanous Breathing  Post-op Pain: none  Post-op Assessment: Post-op Vital signs reviewed, Patient's Cardiovascular Status Stable, Respiratory Function Stable, Patent Airway, No signs of Nausea or vomiting, Adequate PO intake and Pain level controlled  Post-op Vital Signs: Reviewed and stable  Complications: No apparent anesthesia complications

## 2011-11-24 NOTE — H&P (Signed)
Faith [name] is an 56 y.o. female.   Chief Complaint: osteomyelitis abscess right foot second toe HPI: Patient is a 56 year old woman with diabetes end-stage renal disease on dialysis who has undergone conservative treatment and has failed conservative care for infected right foot second toe.  Past Medical History  Diagnosis Date  . Hyperparathyroidism, secondary renal   . CAD (coronary artery disease) of artery bypass graft     CABG in 2002  . PAD (peripheral artery disease)     Not a candidate for revascularization  . Chronic constipation   . GERD (gastroesophageal reflux disease)   . Anemia of chronic renal failure   . Tardive dyskinesia     Secondary to Reglan  . Depression   . Neuropathy   . Diabetic retinopathy   . Diastolic CHF, chronic     Grade 2  . Pulmonary embolism 06/2003  . Hyperlipidemia   . Myocardial infarction 2002  . Stroke 2002    "w/left side" residual weakness  . Dialysis patient 01/17/11    [name]; M, W, F; via right upper arm AVF  . Diabetes mellitus     type II  . CHF (congestive heart failure)   . CVA (cerebral infarction)     right brain with minimal left hemiparesis  . Pneumonia 10/2009; 11/2009    ?pneumonia 01/17/11  . Hypertension   . Arthritis   . Complication of anesthesia     "sleepy afterwards; due to her being dialysis pt"  . ESRD (end stage renal disease) on dialysis     M-W-Fr  [name]    Past Surgical History  Procedure Date  . Coronary artery bypass graft 2002  . Laparoscopic cholecystectomy 2002  . Av fistula placement     times 4-using rt upper arm graft-old rt lower arm,lt upper and lower old grafts  . Trigger finger release   . Tubal ligation   . Eye examination under anesthesia w/ retinal cryotherapy and retinal laser   . Cataract extraction 07/2010    right sided   . Toe amputation 5/12    left 2nd toe secondary to osteomyelitis  . Breast biopsy 06/2010  . Carpal tunnel release 05/05/2011    Procedure:  CARPAL TUNNEL RELEASE;  Surgeon: Sharma Covert, MD;  Location: [facility];  Service: Orthopedics;  Laterality: Right;  . Coronary angioplasty with stent placement     Family History  Problem Relation Age of Onset  . Diabetes Mother   . Cirrhosis Mother   . Diabetes Father   . Heart attack Father   . Hyperlipidemia Father   . Heart disease Father   . Diabetes Sister   . Hyperlipidemia Sister   . Hypertension Sister   . Diabetes Sister   . Diabetes Sister   . Heart disease Brother   . Hyperlipidemia Brother    Social History:  reports that she has never smoked. She has never used smokeless tobacco. She reports that she does not drink alcohol or use illicit drugs.  Allergies:  Allergies  Allergen Reactions  . Codeine Nausea And Vomiting  . Lipitor (Atorvastatin Calcium) Other (See Comments)    Muscle weakness  . Metoclopramide Hcl Other (See Comments)    Unknown rxn    Medications Prior to Admission  Medication Sig Dispense Refill  . acetaminophen (TYLENOL ARTHRITIS PAIN) 650 MG CR tablet Take 650 mg by mouth every 8 (eight) hours as needed. For pain      . albuterol (  PROVENTIL HFA;VENTOLIN HFA) 108 (90 BASE) MCG/ACT inhaler Inhale 2 puffs into the lungs every 4 (four) hours as needed. For shortness of breath      . amLODipine (NORVASC) 5 MG tablet Take 5 mg by mouth at bedtime.       Marland Kitchen aspirin EC 81 MG tablet Take 81 mg by mouth daily.      . cinacalcet (SENSIPAR) 30 MG tablet Take 30 mg by mouth daily. Takes 30mg  in the morning and 90mg  at night      . cinacalcet (SENSIPAR) 90 MG tablet Take 90 mg by mouth every evening. Takes 30mg  in the morning and 90mg  at night      . ezetimibe (ZETIA) 10 MG tablet Take 10 mg by mouth daily.      . fenofibrate micronized (LOFIBRA) 134 MG capsule Take 134 mg by mouth daily.       Marland Kitchen gabapentin (NEURONTIN) 400 MG capsule Take 400 mg by mouth at bedtime.       Marland Kitchen guaiFENesin (ROBITUSSIN) 100 MG/5ML SOLN Take 15 mLs by mouth  every 6 (six) hours as needed. For coughing/wheezing      . HYDROcodone-acetaminophen (VICODIN) 5-500 MG per tablet Take 1 tablet by mouth 2 (two) times daily as needed. For pain      . insulin aspart (NOVOLOG) 100 UNIT/ML injection Inject 4-12 Units into the skin 3 (three) times daily before meals. Per sliding scale      . insulin glargine (LANTUS) 100 UNIT/ML injection Inject 6 Units into the skin at bedtime.       . multivitamin (RENA-VIT) TABS tablet Take 1 tablet by mouth daily.       . pantoprazole (PROTONIX) 40 MG tablet Take 40 mg by mouth at bedtime.       . polyvinyl alcohol (LIQUIFILM TEARS) 1.4 % ophthalmic solution Place 1 drop into both eyes daily as needed. For dryness      . sevelamer (RENAGEL) 800 MG tablet Take 1,600-3,200 mg by mouth See admin instructions. Take 4 tablets with meals and 2 tablets with snacks      . tetrahydrozoline 0.05 % ophthalmic solution Place 1 drop into both eyes daily.      . nitroGLYCERIN (NITROSTAT) 0.4 MG SL tablet Place 0.4 mg under the tongue every 5 (five) minutes as needed. For chest pain        Results for orders placed during the hospital encounter of 11/24/11 (from the past 48 hour(s))  GLUCOSE, CAPILLARY     Status: Normal   Collection Time   11/24/11 10:10 AM      Component Value Range Comment   Glucose-Capillary 88  70 - 99 mg/dL   APTT     Status: Abnormal   Collection Time   11/24/11 10:23 AM      Component Value Range Comment   aPTT 47 (*) 24 - 37 seconds   CBC     Status: Abnormal   Collection Time   11/24/11 10:23 AM      Component Value Range Comment   WBC 8.2  4.0 - 10.5 K/uL    RBC 4.35  3.87 - 5.11 MIL/uL    Hemoglobin 11.6 (*) 12.0 - 15.0 g/dL    HCT 11.9  14.7 - 82.9 %    MCV 83.9  78.0 - 100.0 fL    MCH 26.7  26.0 - 34.0 pg    MCHC 31.8  30.0 - 36.0 g/dL    RDW 56.2 (*) 13.0 - 15.5 %  Platelets 244  150 - 400 K/uL   COMPREHENSIVE METABOLIC PANEL     Status: Abnormal   Collection Time   11/24/11 10:23 AM       Component Value Range Comment   Sodium 134 (*) 135 - 145 mEq/L    Potassium 4.4  3.5 - 5.1 mEq/L    Chloride 93 (*) 96 - 112 mEq/L    CO2 28  19 - 32 mEq/L    Glucose, Bld 88  70 - 99 mg/dL    BUN 22  6 - 23 mg/dL    Creatinine, Ser 1.61 (*) 0.50 - 1.10 mg/dL    Calcium 09.6  8.4 - 10.5 mg/dL    Total Protein 8.2  6.0 - 8.3 g/dL    Albumin 3.9  3.5 - 5.2 g/dL    AST 43 (*) 0 - 37 U/L    ALT 31  0 - 35 U/L    Alkaline Phosphatase 94  39 - 117 U/L    Total Bilirubin 0.7  0.3 - 1.2 mg/dL    GFR calc non Af Amer 8 (*) >90 mL/min    GFR calc Af Amer 9 (*) >90 mL/min   PROTIME-INR     Status: Abnormal   Collection Time   11/24/11 10:23 AM      Component Value Range Comment   Prothrombin Time 16.1 (*) 11.6 - 15.2 seconds    INR 1.32  0.00 - 1.49   GLUCOSE, CAPILLARY     Status: Normal   Collection Time   11/24/11 11:34 AM      Component Value Range Comment   Glucose-Capillary 72  70 - 99 mg/dL    Dg Chest 2 View  05/26/4096  *RADIOLOGY REPORT*  Clinical Data: Preoperative examination (second ray amputation)  CHEST - 2 VIEW  Comparison: 01/19/2011; 01/18/2011; 10/05/2010  Findings:  Grossly unchanged enlarged cardiac silhouette and mediastinal contours post median sternotomy and CABG. There is chronic diffuse thickening of the pulmonary interstitium.  Grossly unchanged bibasilar heterogeneous opacities, left greater than right, favored to represent atelectasis or scar.  No definite pleural effusion or pneumothorax.  Unchanged bones.  IMPRESSION: 1.  Chronic pulmonary venous congestion without definite acute cardiopulmonary disease. 2.  Grossly unchanged bibasilar heterogeneous opacities, left greater than right, favored to represent atelectasis or scar.   Original Report Authenticated By: Waynard Reeds, M.D.     Review of Systems  All other systems reviewed and are negative.    Blood pressure 173/77, pulse 83, temperature 97.8 F (36.6 C), temperature source Oral, resp. rate 18, height  5' 4.5" (1.638 m), weight 78.9 kg (173 lb 15.1 oz), SpO2 95.00%. Physical Exam  On examination patient has diminished pulses she has ulceration and osteomyelitis of the right foot second toe. Assessment/Plan Assessment: Abscess ulceration osteomyelitis right foot second toe.  Plan: Will plan for right foot second ray amputation. Risks and benefits were discussed including persistent infection neurovascular injury nonhealing of the wound need for additional surgery. Patient states he understands was to proceed at this time plan for overnight observation for dialysis in the morning.  Jessaca Philippi V 11/24/2011, 12:26 PM

## 2011-11-24 NOTE — Anesthesia Procedure Notes (Signed)
Anesthesia Regional Block:  Ankle block  Pre-Anesthetic Checklist: ,, timeout performed, Correct Patient, Correct Site, Correct Laterality, Correct Procedure, Correct Position, site marked, Risks and benefits discussed,  Surgical consent,  Pre-op evaluation,  At surgeon's request and post-op pain management  Laterality: Right  Prep: chloraprep       Needles:  Injection technique: Single-shot  Needle Type: Quincke      Needle Gauge: 25 and 25 G    Additional Needles:  Procedures: other  (perineural infiltration) Ankle block Narrative:  Start time: 11/24/2011 12:43 PM End time: 11/24/2011 12:49 PM Injection made incrementally with aspirations every 5 mL.  Performed by: Personally  Anesthesiologist: Sandford Craze, MD  Additional Notes: Pt identified in Holding room.  Monitors applied. Working IV access confirmed. Sterile prep R ankle.  #25ga perineural infiltration of local around deep peroneal, sup peroneal, saph, post tib, and sural nerves.  Total 35cc 0.5% Bupivacaine injected incrementally after negative test dose.  Patient asymptomatic, VSS, no heme aspirated, tolerated well.   Sandford Craze, MD  Ankle block

## 2011-11-24 NOTE — Transfer of Care (Signed)
Immediate Anesthesia Transfer of Care Note  Patient: Faith [name]  Procedure(s) Performed: Procedure(s) (LRB) with comments: AMPUTATION RAY (Right) - Right Foot 2nd Ray Amputation  Patient Location: PACU  Anesthesia Type: MAC and Regional  Level of Consciousness: awake, alert  and oriented  Airway & Oxygen Therapy: Patient Spontanous Breathing and Patient connected to face mask oxygen  Post-op Assessment: Report given to PACU RN, Post -op Vital signs reviewed and stable and Patient moving all extremities X 4  Post vital signs: Reviewed and stable  Complications: No apparent anesthesia complications

## 2011-11-24 NOTE — Preoperative (Signed)
Beta Blockers   Reason not to administer Beta Blockers:Not Applicable 

## 2011-11-24 NOTE — Op Note (Signed)
OPERATIVE REPORT  DATE OF SURGERY: 11/24/2011  PATIENT:  Faith [name],  56 y.o. female  PRE-OPERATIVE DIAGNOSIS:  Osteomyelitis Right 2nd Toe  POST-OPERATIVE DIAGNOSIS:  Osteomyelitis Right 2nd Toe  PROCEDURE:  Procedure(s): AMPUTATION RAY right foot second toe  SURGEON:  Surgeon(s): Nadara Mustard, MD  ANESTHESIA:   regional  EBL:  Minimal ML  SPECIMEN:  No Specimen  TOURNIQUET:  * No tourniquets in log *  PROCEDURE DETAILS: Patient is a 56 year old woman diabetes end-stage renal disease on dialysis with osteomyelitis ulceration of the right foot second toe. She has failed conservative treatment and presents at this time for ray amputation. Risks and benefits were discussed including infection neurovascular injury nonhealing of the wound need for higher level amputation. Patient states she understands and wished to proceed at this time. Description of procedure patient was brought to OR after undergoing an ankle block. After adequate levels of anesthesia were obtained patient's right lower extremity was prepped using DuraPrep and draped into a sterile field a dorsal the incision was made over the second toe this is an amputated through the second metatarsal and the toe ulcer were resected in one block of tissue. The wound was irrigated with normal saline hemostasis was obtained the incision was closed using 2-0 nylon. The wound was covered with Adaptic and ABDs dressing Kerlix and Coban. Patient was extubated taken to the PACU in stable condition plan for overnight observation for dialysis in the morning followup in the office in 2 weeks  PLAN OF CARE: Admit for overnight observation  PATIENT DISPOSITION:  PACU - hemodynamically stable.   Nadara Mustard, MD 11/24/2011 1:35 PM

## 2011-11-24 NOTE — Progress Notes (Signed)
Received patient from PACU.  Alert and oriented x 4.  #18g PIV on L-AC noted.  Right upper arm gortex graft positive (+) for thrill and bruit.  Surgical dressing on right foot noted.   No complaint of pain at this.  Baseline vital signs and physical assesments done and recorded.

## 2011-11-24 NOTE — Consult Note (Signed)
[facility] Renal Consultation Note  Indication for Consultation:  Management of ESRD/hemodialysis; anemia, hypertension/volume and secondary hyperparathyroidism  HPI: LORELEE [name] is a 56 y.o. female with ESRD on dialysis on MWF at the [facility] who had failed conservative treatment for osteomyelitis and ulceration of her second right toe and presented today for right second toe amputation per Dr. Aldean Baker. She will stay overnight for observation and will require dialysis tomorrow on her regularly scheduled day.  She is currently stable and has no complaints.  Dialysis Orders: Center: [facility] on MWF. EDW 79 kg  HD Bath 2K/2Ca   Time 3 hrs 30 mins  Heparin 7000 U. Access AVG @ RUA  BFR 500 DFR 800    Zemplar 18 mcg IV/HD   Epogen 0 Units IV/HD  Venofer 0.  Past Medical History  Diagnosis Date  . Hyperparathyroidism, secondary renal   . CAD (coronary artery disease) of artery bypass graft     CABG in 2002  . PAD (peripheral artery disease)     Not a candidate for revascularization  . Chronic constipation   . GERD (gastroesophageal reflux disease)   . Anemia of chronic renal failure   . Tardive dyskinesia     Secondary to Reglan  . Depression   . Neuropathy   . Diabetic retinopathy   . Diastolic CHF, chronic     Grade 2  . Pulmonary embolism 06/2003  . Hyperlipidemia   . Myocardial infarction 2002  . Stroke 2002    "w/left side" residual weakness  . Dialysis patient 01/17/11    Faith [name]; M, W, F; via right upper arm AVF  . Diabetes mellitus     type II  . CHF (congestive heart failure)   . CVA (cerebral infarction)     right brain with minimal left hemiparesis  . Pneumonia 10/2009; 11/2009    ?pneumonia 01/17/11  . Hypertension   . Arthritis   . Complication of anesthesia     "sleepy afterwards; due to her being dialysis pt"  . ESRD (end stage renal disease) on dialysis     M-W-Fr  Faith [name]   Past Surgical History  Procedure Date    . Coronary artery bypass graft 2002  . Laparoscopic cholecystectomy 2002  . Av fistula placement     times 4-using rt upper arm graft-old rt lower arm,lt upper and lower old grafts  . Trigger finger release   . Tubal ligation   . Eye examination under anesthesia w/ retinal cryotherapy and retinal laser   . Cataract extraction 07/2010    right sided   . Toe amputation 5/12    left 2nd toe secondary to osteomyelitis  . Breast biopsy 06/2010  . Carpal tunnel release 05/05/2011    Procedure: CARPAL TUNNEL RELEASE;  Surgeon: Sharma Covert, MD;  Location: [facility];  Service: Orthopedics;  Laterality: Right;  . Coronary angioplasty with stent placement    Family History  Problem Relation Age of Onset  . Diabetes Mother   . Cirrhosis Mother   . Diabetes Father   . Heart attack Father   . Hyperlipidemia Father   . Heart disease Father   . Diabetes Sister   . Hyperlipidemia Sister   . Hypertension Sister   . Diabetes Sister   . Diabetes Sister   . Heart disease Brother   . Hyperlipidemia Brother    Social History  She denies any history of tobacco use, but has occasional alcohol.  She smoked marijuana long ago, but denies any other illicit drug use.  She graduated from high school in [place] and has worked as a Conservation officer, nature and a Architectural technologist.  She is married and has one son.  Allergies  Allergen Reactions  . Codeine Nausea And Vomiting  . Lipitor (Atorvastatin Calcium) Other (See Comments)    Muscle weakness  . Metoclopramide Hcl Other (See Comments)    Unknown rxn   Prior to Admission medications   Medication Sig Start Date End Date Taking? Authorizing Provider  acetaminophen (TYLENOL ARTHRITIS PAIN) 650 MG CR tablet Take 650 mg by mouth every 8 (eight) hours as needed. For pain   Yes Historical Provider, MD  albuterol (PROVENTIL HFA;VENTOLIN HFA) 108 (90 BASE) MCG/ACT inhaler Inhale 2 puffs into the lungs every 4 (four) hours as needed. For shortness of  breath   Yes Historical Provider, MD  amLODipine (NORVASC) 5 MG tablet Take 5 mg by mouth at bedtime.    Yes Historical Provider, MD  aspirin EC 81 MG tablet Take 81 mg by mouth daily.   Yes Historical Provider, MD  cinacalcet (SENSIPAR) 30 MG tablet Take 30 mg by mouth daily. Takes 30mg  in the morning and 90mg  at night   Yes Historical Provider, MD  cinacalcet (SENSIPAR) 90 MG tablet Take 90 mg by mouth every evening. Takes 30mg  in the morning and 90mg  at night   Yes Historical Provider, MD  ezetimibe (ZETIA) 10 MG tablet Take 10 mg by mouth daily.   Yes Historical Provider, MD  fenofibrate micronized (LOFIBRA) 134 MG capsule Take 134 mg by mouth daily.    Yes Historical Provider, MD  gabapentin (NEURONTIN) 400 MG capsule Take 400 mg by mouth at bedtime.    Yes Historical Provider, MD  guaiFENesin (ROBITUSSIN) 100 MG/5ML SOLN Take 15 mLs by mouth every 6 (six) hours as needed. For coughing/wheezing   Yes Historical Provider, MD  HYDROcodone-acetaminophen (VICODIN) 5-500 MG per tablet Take 1 tablet by mouth 2 (two) times daily as needed. For pain   Yes Historical Provider, MD  insulin aspart (NOVOLOG) 100 UNIT/ML injection Inject 4-12 Units into the skin 3 (three) times daily before meals. Per sliding scale   Yes Historical Provider, MD  insulin glargine (LANTUS) 100 UNIT/ML injection Inject 6 Units into the skin at bedtime.    Yes Historical Provider, MD  multivitamin (RENA-VIT) TABS tablet Take 1 tablet by mouth daily.    Yes Historical Provider, MD  pantoprazole (PROTONIX) 40 MG tablet Take 40 mg by mouth at bedtime.    Yes Historical Provider, MD  polyvinyl alcohol (LIQUIFILM TEARS) 1.4 % ophthalmic solution Place 1 drop into both eyes daily as needed. For dryness   Yes Historical Provider, MD  sevelamer (RENAGEL) 800 MG tablet Take 1,600-3,200 mg by mouth See admin instructions. Take 4 tablets with meals and 2 tablets with snacks   Yes Historical Provider, MD  tetrahydrozoline 0.05 % ophthalmic  solution Place 1 drop into both eyes daily.   Yes Historical Provider, MD  nitroGLYCERIN (NITROSTAT) 0.4 MG SL tablet Place 0.4 mg under the tongue every 5 (five) minutes as needed. For chest pain    Historical Provider, MD   Labs:  Results for orders placed during the hospital encounter of 11/24/11 (from the past 48 hour(s))  GLUCOSE, CAPILLARY     Status: Normal   Collection Time   11/24/11 10:10 AM      Component Value Range Comment   Glucose-Capillary 88  70 - 99  mg/dL   APTT     Status: Abnormal   Collection Time   11/24/11 10:23 AM      Component Value Range Comment   aPTT 47 (*) 24 - 37 seconds   CBC     Status: Abnormal   Collection Time   11/24/11 10:23 AM      Component Value Range Comment   WBC 8.2  4.0 - 10.5 K/uL    RBC 4.35  3.87 - 5.11 MIL/uL    Hemoglobin 11.6 (*) 12.0 - 15.0 g/dL    HCT 40.9  81.1 - 91.4 %    MCV 83.9  78.0 - 100.0 fL    MCH 26.7  26.0 - 34.0 pg    MCHC 31.8  30.0 - 36.0 g/dL    RDW 78.2 (*) 95.6 - 15.5 %    Platelets 244  150 - 400 K/uL   COMPREHENSIVE METABOLIC PANEL     Status: Abnormal   Collection Time   11/24/11 10:23 AM      Component Value Range Comment   Sodium 134 (*) 135 - 145 mEq/L    Potassium 4.4  3.5 - 5.1 mEq/L    Chloride 93 (*) 96 - 112 mEq/L    CO2 28  19 - 32 mEq/L    Glucose, Bld 88  70 - 99 mg/dL    BUN 22  6 - 23 mg/dL    Creatinine, Ser 2.13 (*) 0.50 - 1.10 mg/dL    Calcium 08.6  8.4 - 10.5 mg/dL    Total Protein 8.2  6.0 - 8.3 g/dL    Albumin 3.9  3.5 - 5.2 g/dL    AST 43 (*) 0 - 37 U/L    ALT 31  0 - 35 U/L    Alkaline Phosphatase 94  39 - 117 U/L    Total Bilirubin 0.7  0.3 - 1.2 mg/dL    GFR calc non Af Amer 8 (*) >90 mL/min    GFR calc Af Amer 9 (*) >90 mL/min   PROTIME-INR     Status: Abnormal   Collection Time   11/24/11 10:23 AM      Component Value Range Comment   Prothrombin Time 16.1 (*) 11.6 - 15.2 seconds    INR 1.32  0.00 - 1.49   SURGICAL PCR SCREEN     Status: Normal   Collection Time    11/24/11 10:29 AM      Component Value Range Comment   MRSA, PCR NEGATIVE  NEGATIVE    Staphylococcus aureus NEGATIVE  NEGATIVE   GLUCOSE, CAPILLARY     Status: Normal   Collection Time   11/24/11 11:34 AM      Component Value Range Comment   Glucose-Capillary 72  70 - 99 mg/dL   GLUCOSE, CAPILLARY     Status: Normal   Collection Time   11/24/11  1:41 PM      Component Value Range Comment   Glucose-Capillary 92  70 - 99 mg/dL    Constitutional: negative for chills, fatigue, fevers and sweats Ears, nose, mouth, throat, and face: negative for hearing loss, hoarseness, nasal congestion and sore throat Respiratory: negative for cough, dyspnea on exertion and hemoptysis Cardiovascular: negative for chest pain, dyspnea, orthopnea and palpitations Gastrointestinal: negative for abdominal pain, change in bowel habits, nausea and vomiting Genitourinary:negative, anuric Musculoskeletal:negative for arthralgias, back pain, muscle weakness and neck pain Neurological: negative for coordination problems, dizziness, gait problems and headaches  Physical Exam: Filed Vitals:  11/24/11 1431  BP:   Pulse: 69  Temp:   Resp: 31     General appearance: alert, cooperative and no distress Head: Normocephalic, without obvious abnormality, atraumatic Neck: no adenopathy, no carotid bruit, no JVD and supple, symmetrical, trachea midline Resp: clear to auscultation bilaterally Cardio: regular rate and rhythm, S1, S2 normal, no murmur, click, rub or gallop GI: soft, non-tender; bowel sounds normal; no masses,  no organomegaly Extremities: extremities normal, no cyanosis or edema; dressing on right foot Neurologic: Grossly normal Dialysis Access: AVG @ RUA with +bruit   Assessment/Plan: 1. Osteomyelitis of right 2nd toe - s/p amputation per Dr. Lajoyce Corners today; stable. 2. ESRD -  HD on MWF @ [facility]; K 4.4.  HD tomorrow. 3. Hypertension/volume  - BP 104/61 most recently, on Amlodipine 5 mg qhs; current wt 78.9  kg with EDW 79 kg.  4. Anemia  - Hgb 11.6, no outpatient Epogen or Fe. 5. Metabolic bone disease -  Ca 10.4, last P 5.3 on 9/18; on Zemplar 18 mcg, Sensipar 120 mg qd, Revela with meals.  6. Nutrition - Alb 3.9. 7. DM Type 2 - on insulin. 8. Hx CAD - s/p CABG x 3 in 08/2000. 9. Hx PVD - s/p CVA in 08/2000 with right-side hemiparesis.  Talon Regala 11/24/2011, 3:40 PM   Attending Nephrologist: Terrial Rhodes, MD

## 2011-11-24 NOTE — Anesthesia Preprocedure Evaluation (Signed)
Anesthesia Evaluation  Patient identified by MRN, date of birth, ID band Patient awake    Reviewed: Allergy & Precautions, H&P , NPO status , Patient's Chart, lab work & pertinent test results  History of Anesthesia Complications (+) PONV  Airway Mallampati: II TM Distance: >3 FB Neck ROM: Full    Dental  (+) Edentulous Upper and Edentulous Lower   Pulmonary neg pulmonary ROS, neg pneumonia -, COPD COPD inhaler,  breath sounds clear to auscultation  Pulmonary exam normal       Cardiovascular hypertension, Pt. on medications + CAD, + Past MI, + CABG ('02 CABG) and + Peripheral Vascular Disease - CHF Rhythm:Regular Rate:Normal  '11 ECHO: normal LVF   Neuro/Psych CVA (L weakness, walks with walker), Residual Symptoms    GI/Hepatic Neg liver ROS, GERD-  Medicated and Controlled,  Endo/Other  diabetes (glu 72), Well Controlled, Type 2, Insulin Dependent  Renal/GU ESRF and DialysisRenal disease (MWF dialysis, K+ 4.4)     Musculoskeletal   Abdominal (+) + obese,   Peds  Hematology   Anesthesia Other Findings   Reproductive/Obstetrics                           Anesthesia Physical Anesthesia Plan  ASA: III  Anesthesia Plan: MAC and Regional   Post-op Pain Management:    Induction: Intravenous  Airway Management Planned: Simple Face Mask  Additional Equipment:   Intra-op Plan:   Post-operative Plan:   Informed Consent: I have reviewed the patients History and Physical, chart, labs and discussed the procedure including the risks, benefits and alternatives for the proposed anesthesia with the patient or authorized representative who has indicated his/her understanding and acceptance.     Plan Discussed with: CRNA and Surgeon  Anesthesia Plan Comments: (Plan routine monitors, ankle block with sedation/MAC)        Anesthesia Quick Evaluation

## 2011-11-24 NOTE — Progress Notes (Signed)
Pt  Has cbg of 72 ... She was sent for the cxr ... Call to or .I spoke with the patient about this. With Sonya.. She will speak to Dr Jean Rosenthal  And give her this new cbg .Marland Kitchen And call me back .

## 2011-11-24 NOTE — Progress Notes (Signed)
PER SONYA/DR  JACKSON PT TO BE BROUGHT TO OR  AFTER CXR .Marland Kitchen DO NOT TREAT CBG .

## 2011-11-25 ENCOUNTER — Ambulatory Visit (HOSPITAL_COMMUNITY): Payer: Medicare (Managed Care)

## 2011-11-25 ENCOUNTER — Encounter (HOSPITAL_COMMUNITY): Payer: Self-pay | Admitting: Orthopedic Surgery

## 2011-11-25 DIAGNOSIS — M869 Osteomyelitis, unspecified: Secondary | ICD-10-CM | POA: Diagnosis present

## 2011-11-25 LAB — RENAL FUNCTION PANEL
Albumin: 3.6 g/dL (ref 3.5–5.2)
BUN: 31 mg/dL — ABNORMAL HIGH (ref 6–23)
Calcium: 9.4 mg/dL (ref 8.4–10.5)
Creatinine, Ser: 6.56 mg/dL — ABNORMAL HIGH (ref 0.50–1.10)
GFR calc non Af Amer: 6 mL/min — ABNORMAL LOW (ref >90)

## 2011-11-25 LAB — CBC
MCH: 27 pg (ref 26.0–34.0)
MCV: 83.3 fL (ref 78.0–100.0)
Platelets: 250 10*3/uL (ref 150–400)
RDW: 15.6 % — ABNORMAL HIGH (ref 11.5–15.5)

## 2011-11-25 LAB — GLUCOSE, CAPILLARY
Glucose-Capillary: 94 mg/dL (ref 70–99)
Glucose-Capillary: 161 mg/dL — ABNORMAL HIGH (ref 70–99)
Glucose-Capillary: 171 mg/dL — ABNORMAL HIGH (ref 70–99)

## 2011-11-25 MED ORDER — HYDROCODONE-ACETAMINOPHEN 5-325 MG PO TABS: 1.0000 | ORAL_TABLET | ORAL | Status: DC | PRN

## 2011-11-25 MED ORDER — PARICALCITOL 5 MCG/ML IV SOLN
INTRAVENOUS | Status: AC
  Administered 2011-11-25: 7 ug via INTRAVENOUS
  Filled 2011-11-25: qty 2

## 2011-11-25 MED ORDER — INSULIN ASPART 100 UNIT/ML ~~LOC~~ SOLN
4.0000 [IU] | Freq: Three times a day (TID) | SUBCUTANEOUS | Status: DC
  Administered 2011-11-25: 6 [IU] via SUBCUTANEOUS

## 2011-11-25 NOTE — Progress Notes (Signed)
Pt discharged to home with husband. Vital signs remain stable. Pt and husband educated that pt should not get up at home without assistance. Follow up appt with Dr Lajoyce Corners on 10/17. Wilbon Obenchain K

## 2011-11-25 NOTE — Progress Notes (Signed)
Subjective:   Seen on HD, no complaints, hopes to go home.  Objective: Vital signs in last 24 hours: Temp:  [97 F (36.1 C)-98.9 F (37.2 C)] 97.8 F (36.6 C) (10/04 0423) Pulse Rate:  [62-83] 63  (10/04 0423) Resp:  [16-39] 19  (10/04 0423) BP: (96-173)/(49-77) 115/63 mmHg (10/04 0423) SpO2:  [94 %-100 %] 98 % (10/04 0423) FiO2 (%):  [100 %] 100 % (10/03 1457) Weight:  [78.9 kg (173 lb 15.1 oz)-80.5 kg (177 lb 7.5 oz)] 80.5 kg (177 lb 7.5 oz) (10/03 2200) Weight change:   Intake/Output from previous day: 10/03 0701 - 10/04 0700 In: 250 [P.O.:100; I.V.:150] Out: -    EXAM: General appearance:  Alert, in no apparent distress Resp:  CTA without murmur Cardio: RRR without murmur GI:  + BS, soft and nontender Extremities:  No edema, dressing on right foot Access:  AVG @ RUA with BFR 400 cc/min  Lab Results:  Basename 11/24/11 1023  WBC 8.2  HGB 11.6*  HCT 36.5  PLT 244   BMET:  Basename 11/24/11 1023  NA 134*  K 4.4  CL 93*  CO2 28  GLUCOSE 88  BUN 22  CREATININE 5.29*  CALCIUM 10.4  ALBUMIN 3.9   No results found for this basename: PTH:2 in the last 72 hours Iron Studies: No results found for this basename: IRON,TIBC,TRANSFERRIN,FERRITIN in the last 72 hours  Dialysis Orders: Center: [facility] on MWF.  EDW 79 kg HD Bath 2K/2Ca Time 3 hrs 30 mins Heparin 7000 U. Access AVG @ RUA BFR 500 DFR 800  Zemplar 18 mcg IV/HD Epogen 0 Units IV/HD Venofer 0.   Assessment/Plan: 1. Osteomyelitis of right 2nd toe - s/p amputation 10/3 per Dr. Lajoyce Corners today; stable.  2. ESRD - HD on MWF @ [facility]; K 4.4. HD today.  3. Hypertension/volume - BP 109/61 most recently, on Amlodipine 5 mg qhs; current wt 79.7 kg with EDW 79 kg.  UF goal of 1.5 L.  4. Anemia - Hgb 11.6, no outpatient Epogen or Fe.  5. Metabolic bone disease - Ca 10.4, last P 5.3 on 9/18; on Zemplar 18 mcg, Sensipar 120 mg qd, Revela with meals.  6. Nutrition - Alb 3.9. DM Type 2 - on insulin.  7. Hx CAD - s/p CABG x 3 in  08/2000.  8. 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  BP 113/69, goal 1.2L  Hgb 11, K 5.0.  She says Dr. Lajoyce Corners plans d/c after dialysis.

## 2011-11-25 NOTE — Progress Notes (Signed)
PT Cancellation Note  Treatment cancelled today due to patient receiving procedure or test. Pt. Currently in HD.  Will attempt to evaluate this pm as schedule permits.  Ferman Hamming 11/25/2011, 9:38 AM Weldon Picking PT Acute Rehab Services [phone] [place] [phone]

## 2011-11-25 NOTE — Progress Notes (Signed)
Subjective: 1 Day Post-Op Procedure(s) (LRB): AMPUTATION RAY (Right) Patient reports pain as mild.    Objective: Vital signs in last 24 hours: Temp:  [97 F (36.1 C)-99.2 F (37.3 C)] 99.2 F (37.3 C) (10/04 1033) Pulse Rate:  [59-87] 87  (10/04 1033) Resp:  [16-39] 18  (10/04 1033) BP: (96-144)/(49-76) 135/64 mmHg (10/04 1033) SpO2:  [92 %-100 %] 100 % (10/04 1033) FiO2 (%):  [100 %] 100 % (10/03 1457) Weight:  [79.4 kg (175 lb 0.7 oz)-80.5 kg (177 lb 7.5 oz)] 79.4 kg (175 lb 0.7 oz) (10/04 0955)  Intake/Output from previous day: 10/03 0701 - 10/04 0700 In: 610 [P.O.:460; I.V.:150] Out: -  Intake/Output this shift: Total I/O In: -  Out: 406 [Other:406]   Basename 11/25/11 0629 11/24/11 1023  HGB 11.0* 11.6*    Basename 11/25/11 0629 11/24/11 1023  WBC 7.3 8.2  RBC 4.08 4.35  HCT 34.0* 36.5  PLT 250 244    Basename 11/25/11 0629 11/24/11 1023  NA 130* 134*  K 5.0 4.4  CL 89* 93*  CO2 26 28  BUN 31* 22  CREATININE 6.56* 5.29*  GLUCOSE 89 88  CALCIUM 9.4 10.4    Basename 11/24/11 1023  LABPT --  INR 1.32    dressing intact.  cap refill of toes intact.  moving toes well  Assessment/Plan: 1 Day Post-Op Procedure(s) (LRB): AMPUTATION RAY (Right) D/C IV fluids Discharge to home today  Kymberlie Brazeau M 11/25/2011, 12:48 PM

## 2011-11-25 NOTE — Progress Notes (Signed)
Per pt RN pt is client of [facility]. This CM contacted [facility] and faxed d/c instructions per their request. Pt will d/c to home and husband will provide care then pt will travel to [facility] day[facility] center for PT on days that she does not go to Outpatient hemodialysis.  Johny Shock RN MPH Case manager [phone]

## 2011-11-25 NOTE — Evaluation (Signed)
Physical Therapy Evaluation Patient Details Name: Faith [name] MRN: [id number] DOB: [birth year] Today's Date: 11/25/2011 Time: 4098-1191 PT Time Calculation (min): 31 min  PT Assessment / Plan / Recommendation Clinical Impression  Faith [name] is 56 y/o female s/p right foot 2nd toe amputation POD #1. Presents to PT today with generalized weakness, pain, balance deficits and new weight bearing precautions affecting her safety and independence with mobility. Will benefit physical therapy in the acute setting to address these and to maximize function and independence for safe d/c home. Rec HHPT and at least minA for all mobility/transfers at home. Pt educated on my recommendations and was able to teach them back to me. I reviewed her weight bearing precautions with her regarding her foot and limiting her mobility based on the d/c instructions from ortho. Noted pt likely to d/c home today, pt at a high risk of falls when she goes home and RN aware of my concern.  Advised pt to use her w/c on d/c today for safety.     PT Assessment  Patient needs continued PT services    Follow Up Recommendations  Home health PT;Supervision for mobility/OOB (at least minA for all transfers and ambulation)    Barriers to Discharge Decreased caregiver support Unsure of how often pt is home alone    Equipment Recommendations  None recommended by PT    Recommendations for Other Services     Frequency Min 3X/week    Precautions / Restrictions Precautions Precautions: Fall Restrictions Weight Bearing Restrictions: Yes RLE Weight Bearing: Partial weight bearing RLE Partial Weight Bearing Percentage or Pounds: 50 Other Position/Activity Restrictions: Limited ambulation, wear post op shoe when OOB         Mobility  Bed Mobility Bed Mobility: Not assessed Transfers Transfers: Stand Pivot Transfers;Sit to Stand;Stand to Sit Sit to Stand: 3: Mod assist;4: Min assist;From chair/3-in-1;From bed;With upper  extremity assist;From elevated surface;With armrests Stand to Sit: 4: Min assist;With upper extremity assist;To chair/3-in-1;To bed Stand Pivot Transfers: 3: Mod assist Details for Transfer Assistance: initial sit->stand from lower chair surface pt needing heavy facilitation for stability and still falling posteriorly so needing to sit on the chair, second attempt with more verbal cues and mod facilitation pt able to achieve tall standing with assist for hand placement; from elevated surface patient able to stand minA; pt transferred chair->3in1 with verbal and tactile cues for safe sequencing (pt impulsive and unsafely using RW); sat with verbal cues for hand placement and minA to control descent Ambulation/Gait Ambulation/Gait Assistance: 4: Min guard Ambulation Distance (Feet): 10 Feet Assistive device: Rolling walker Ambulation/Gait Assistance Details: verbal cues for weight bearing status and use of RW to unweight RLE, cues for tall posture and sequencing with RW Gait Pattern: Ataxic General Gait Details: slight ataxic type step on the right and left, pushing RW too far anteriorly    Shoulder Instructions     Exercises     PT Diagnosis: Difficulty walking;Abnormality of gait;Generalized weakness;Acute pain  PT Problem List: Decreased strength;Decreased balance;Decreased mobility;Decreased activity tolerance;Pain;Obesity;Impaired sensation;Decreased knowledge of use of DME;Decreased knowledge of precautions;Decreased safety awareness PT Treatment Interventions: DME instruction;Gait training;Functional mobility training;Balance training;Therapeutic exercise;Therapeutic activities;Neuromuscular re-education;Patient/family education;Cognitive remediation;Wheelchair mobility training   PT Goals Acute Rehab PT Goals PT Goal Formulation: With patient Time For Goal Achievement: 12/02/11 Potential to Achieve Goals: Good Pt will go Sit to Stand: with modified independence PT Goal: Sit to Stand -  Progress: Goal set today Pt will go Stand to Sit: with modified independence PT  Goal: Stand to Sit - Progress: Goal set today Pt will Transfer Bed to Chair/Chair to Bed: with modified independence PT Transfer Goal: Bed to Chair/Chair to Bed - Progress: Goal set today Pt will Ambulate: 1 - 15 feet;with modified independence;with least restrictive assistive device PT Goal: Ambulate - Progress: Goal set today  Visit Information  Last PT Received On: 11/25/11 Assistance Needed: +1    Subjective Data  Subjective: Well my leg is just painful.    Prior Functioning  Home Living Lives With: Spouse Available Help at Discharge: Family;Personal care attendant;Available PRN/intermittently ([facility] program) Type of Home: House Home Access: Level entry Home Layout: One level Bathroom Toilet: Standard Home Adaptive Equipment: Bedside commode/3-in-1;Walker - rolling;Shower chair with back;Wheelchair - manual Prior Function Level of Independence: Needs assistance Needs Assistance: Bathing;Dressing Bath: Minimal Dressing: Minimal Able to Take Stairs?: No Driving: No Vocation: On disability Comments: prior CVA, was staying at home during the day when not at dialysis or [facility], she has CVA that comes for 2 hours 5 days/wk Communication Communication: No difficulties    Cognition  Overall Cognitive Status: Impaired Area of Impairment: Safety/judgement Arousal/Alertness: Awake/alert Orientation Level: Appears intact for tasks assessed Behavior During Session: [facility] for tasks performed Safety/Judgement: Decreased safety judgement for tasks assessed;Decreased awareness of need for assistance;Impulsive Safety/Judgement - Other Comments: cues for problem solving basic transfers, impulsive with transfers and needs cues for safety    Extremity/Trunk Assessment Right Upper Extremity Assessment RUE ROM/Strength/Tone: [facility] for tasks assessed Left Upper Extremity Assessment LUE ROM/Strength/Tone: WFL for  tasks assessed Right Lower Extremity Assessment RLE ROM/Strength/Tone: Unable to fully assess;Due to pain Left Lower Extremity Assessment LLE ROM/Strength/Tone: [facility] for tasks assessed Trunk Assessment Trunk Assessment: Kyphotic   Balance    End of Session PT - End of Session Equipment Utilized During Treatment: Gait belt Activity Tolerance: Patient tolerated treatment well;Patient limited by fatigue Patient left: in chair;with call bell/phone within reach (legs elevated) Nurse Communication: Mobility status;Other (comment) (d/c recommendations)  GP Functional Limitation: Mobility: Walking and moving around Mobility: Walking and Moving Around Current Status [phone]): At least 40 percent but less than 60 percent impaired, limited or restricted Mobility: Walking and Moving Around Goal Status [phone]): At least 1 percent but less than 20 percent impaired, limited or restricted   [facility] HELEN 11/25/2011, 2:01 PM

## 2011-12-05 ENCOUNTER — Ambulatory Visit
Admission: RE | Admit: 2011-12-05 | Discharge: 2011-12-05 | Disposition: A | Discharge status: Home / Self Care | Payer: PRIVATE HEALTH INSURANCE | Source: Ambulatory Visit | Attending: Family Medicine | Admitting: Family Medicine

## 2011-12-05 ENCOUNTER — Other Ambulatory Visit: Payer: Self-pay | Admitting: Family Medicine

## 2011-12-05 DIAGNOSIS — R05 Cough: Secondary | ICD-10-CM

## 2011-12-05 DIAGNOSIS — R059 Cough, unspecified: Secondary | ICD-10-CM

## 2011-12-12 ENCOUNTER — Inpatient Hospital Stay (HOSPITAL_COMMUNITY): Payer: Medicare (Managed Care) | Admitting: Anesthesiology

## 2011-12-12 ENCOUNTER — Encounter (HOSPITAL_COMMUNITY): Payer: Self-pay

## 2011-12-12 ENCOUNTER — Inpatient Hospital Stay (HOSPITAL_COMMUNITY)
Admission: RE | Admit: 2011-12-12 | Discharge: 2011-12-15 | DRG: 239 | Disposition: A | Discharge status: Rehabilitation Facility | Payer: Medicare (Managed Care) | Source: Ambulatory Visit | Attending: Orthopedic Surgery | Admitting: Orthopedic Surgery

## 2011-12-12 ENCOUNTER — Other Ambulatory Visit (HOSPITAL_COMMUNITY): Payer: Self-pay | Admitting: Orthopedic Surgery

## 2011-12-12 ENCOUNTER — Encounter (HOSPITAL_COMMUNITY): Payer: Self-pay | Admitting: Anesthesiology

## 2011-12-12 ENCOUNTER — Encounter (HOSPITAL_COMMUNITY): Payer: Self-pay | Admitting: Pharmacy Technician

## 2011-12-12 ENCOUNTER — Encounter (HOSPITAL_COMMUNITY)
Admission: RE | Disposition: A | Discharge status: Rehabilitation Facility | Payer: Self-pay | Source: Ambulatory Visit | Attending: Orthopedic Surgery

## 2011-12-12 DIAGNOSIS — I2510 Atherosclerotic heart disease of native coronary artery without angina pectoris: Secondary | ICD-10-CM | POA: Diagnosis present

## 2011-12-12 DIAGNOSIS — Z9861 Coronary angioplasty status: Secondary | ICD-10-CM

## 2011-12-12 DIAGNOSIS — Z992 Dependence on renal dialysis: Secondary | ICD-10-CM

## 2011-12-12 DIAGNOSIS — I5032 Chronic diastolic (congestive) heart failure: Secondary | ICD-10-CM | POA: Diagnosis present

## 2011-12-12 DIAGNOSIS — L97509 Non-pressure chronic ulcer of other part of unspecified foot with unspecified severity: Secondary | ICD-10-CM | POA: Diagnosis present

## 2011-12-12 DIAGNOSIS — I120 Hypertensive chronic kidney disease with stage 5 chronic kidney disease or end stage renal disease: Secondary | ICD-10-CM | POA: Diagnosis present

## 2011-12-12 DIAGNOSIS — E1169 Type 2 diabetes mellitus with other specified complication: Secondary | ICD-10-CM | POA: Diagnosis present

## 2011-12-12 DIAGNOSIS — Z8249 Family history of ischemic heart disease and other diseases of the circulatory system: Secondary | ICD-10-CM

## 2011-12-12 DIAGNOSIS — I70269 Atherosclerosis of native arteries of extremities with gangrene, unspecified extremity: Secondary | ICD-10-CM | POA: Diagnosis present

## 2011-12-12 DIAGNOSIS — K219 Gastro-esophageal reflux disease without esophagitis: Secondary | ICD-10-CM | POA: Diagnosis present

## 2011-12-12 DIAGNOSIS — Z833 Family history of diabetes mellitus: Secondary | ICD-10-CM

## 2011-12-12 DIAGNOSIS — E1159 Type 2 diabetes mellitus with other circulatory complications: Principal | ICD-10-CM | POA: Diagnosis present

## 2011-12-12 DIAGNOSIS — M869 Osteomyelitis, unspecified: Secondary | ICD-10-CM | POA: Diagnosis present

## 2011-12-12 DIAGNOSIS — S98139A Complete traumatic amputation of one unspecified lesser toe, initial encounter: Secondary | ICD-10-CM

## 2011-12-12 DIAGNOSIS — Z9089 Acquired absence of other organs: Secondary | ICD-10-CM

## 2011-12-12 DIAGNOSIS — Z86711 Personal history of pulmonary embolism: Secondary | ICD-10-CM

## 2011-12-12 DIAGNOSIS — N186 End stage renal disease: Secondary | ICD-10-CM | POA: Diagnosis present

## 2011-12-12 DIAGNOSIS — E11319 Type 2 diabetes mellitus with unspecified diabetic retinopathy without macular edema: Secondary | ICD-10-CM | POA: Diagnosis present

## 2011-12-12 DIAGNOSIS — E785 Hyperlipidemia, unspecified: Secondary | ICD-10-CM | POA: Diagnosis present

## 2011-12-12 DIAGNOSIS — I509 Heart failure, unspecified: Secondary | ICD-10-CM | POA: Diagnosis present

## 2011-12-12 DIAGNOSIS — E1139 Type 2 diabetes mellitus with other diabetic ophthalmic complication: Secondary | ICD-10-CM | POA: Diagnosis present

## 2011-12-12 DIAGNOSIS — Z951 Presence of aortocoronary bypass graft: Secondary | ICD-10-CM

## 2011-12-12 DIAGNOSIS — N2581 Secondary hyperparathyroidism of renal origin: Secondary | ICD-10-CM | POA: Diagnosis present

## 2011-12-12 DIAGNOSIS — M9080 Osteopathy in diseases classified elsewhere, unspecified site: Secondary | ICD-10-CM | POA: Diagnosis present

## 2011-12-12 DIAGNOSIS — D649 Anemia, unspecified: Secondary | ICD-10-CM | POA: Diagnosis present

## 2011-12-12 DIAGNOSIS — S88919A Complete traumatic amputation of unspecified lower leg, level unspecified, initial encounter: Secondary | ICD-10-CM

## 2011-12-12 HISTORY — PX: AMPUTATION: SHX166

## 2011-12-12 LAB — PROTIME-INR: INR: 1.44 (ref 0.00–1.49)

## 2011-12-12 LAB — COMPREHENSIVE METABOLIC PANEL
AST: 30 U/L (ref 0–37)
Albumin: 3 g/dL — ABNORMAL LOW (ref 3.5–5.2)
Calcium: 9.5 mg/dL (ref 8.4–10.5)
Chloride: 93 mEq/L — ABNORMAL LOW (ref 96–112)
Creatinine, Ser: 3.09 mg/dL — ABNORMAL HIGH (ref 0.50–1.10)
Total Protein: 8 g/dL (ref 6.0–8.3)

## 2011-12-12 LAB — GLUCOSE, CAPILLARY
Glucose-Capillary: 144 mg/dL — ABNORMAL HIGH (ref 70–99)
Glucose-Capillary: 147 mg/dL — ABNORMAL HIGH (ref 70–99)
Glucose-Capillary: 129 mg/dL — ABNORMAL HIGH (ref 70–99)
Glucose-Capillary: 119 mg/dL — ABNORMAL HIGH (ref 70–99)

## 2011-12-12 LAB — CBC
MCH: 25.9 pg — ABNORMAL LOW (ref 26.0–34.0)
MCV: 79.6 fL (ref 78.0–100.0)
Platelets: 344 10*3/uL (ref 150–400)
RDW: 16.5 % — ABNORMAL HIGH (ref 11.5–15.5)
WBC: 14 10*3/uL — ABNORMAL HIGH (ref 4.0–10.5)

## 2011-12-12 SURGERY — AMPUTATION BELOW KNEE
Anesthesia: General | Site: Foot | Laterality: Right | Wound class: Dirty or Infected

## 2011-12-12 MED ORDER — METOCLOPRAMIDE HCL 5 MG PO TABS: 5.0000 mg | ORAL_TABLET | Freq: Three times a day (TID) | ORAL | Status: DC | PRN

## 2011-12-12 MED ORDER — 0.9 % SODIUM CHLORIDE (POUR BTL) OPTIME
TOPICAL | Status: DC | PRN
  Administered 2011-12-12: 1000 mL

## 2011-12-12 MED ORDER — GABAPENTIN 400 MG PO CAPS
400.0000 mg | ORAL_CAPSULE | Freq: Every day | ORAL | Status: DC
  Administered 2011-12-12 – 2011-12-15 (×3): 400 mg via ORAL
  Filled 2011-12-12 (×4): qty 1

## 2011-12-12 MED ORDER — FENOFIBRATE 54 MG PO TABS
54.0000 mg | ORAL_TABLET | Freq: Every day | ORAL | Status: DC
  Administered 2011-12-13 – 2011-12-15 (×3): 54 mg via ORAL
  Filled 2011-12-12 (×3): qty 1

## 2011-12-12 MED ORDER — WARFARIN - PHARMACIST DOSING INPATIENT: Freq: Every day | Status: DC

## 2011-12-12 MED ORDER — HYDROMORPHONE HCL PF 1 MG/ML IJ SOLN
INTRAMUSCULAR | Status: AC
  Filled 2011-12-12: qty 1

## 2011-12-12 MED ORDER — CEFAZOLIN SODIUM 1-5 GM-% IV SOLN
INTRAVENOUS | Status: AC
  Filled 2011-12-12: qty 50

## 2011-12-12 MED ORDER — HYDROMORPHONE HCL PF 1 MG/ML IJ SOLN
0.2500 mg | INTRAMUSCULAR | Status: DC | PRN
  Administered 2011-12-12 (×4): 0.5 mg via INTRAVENOUS

## 2011-12-12 MED ORDER — WARFARIN VIDEO
Freq: Once | Status: AC
  Administered 2011-12-13: 13:00:00

## 2011-12-12 MED ORDER — LIDOCAINE HCL (CARDIAC) 20 MG/ML IV SOLN
INTRAVENOUS | Status: DC | PRN
  Administered 2011-12-12: 40 mg via INTRAVENOUS

## 2011-12-12 MED ORDER — OXYCODONE HCL 5 MG PO TABS: 5.0000 mg | ORAL_TABLET | Freq: Once | ORAL | Status: DC | PRN

## 2011-12-12 MED ORDER — SODIUM CHLORIDE 0.9 % IV SOLN
INTRAVENOUS | Status: DC
  Administered 2011-12-12: 17:00:00 via INTRAVENOUS

## 2011-12-12 MED ORDER — RENA-VITE PO TABS
1.0000 | ORAL_TABLET | Freq: Every day | ORAL | Status: DC
  Administered 2011-12-12 – 2011-12-15 (×3): 1 via ORAL
  Filled 2011-12-12 (×4): qty 1

## 2011-12-12 MED ORDER — NAPHAZOLINE HCL 0.1 % OP SOLN
1.0000 [drp] | Freq: Four times a day (QID) | OPHTHALMIC | Status: DC | PRN
  Filled 2011-12-12: qty 15

## 2011-12-12 MED ORDER — AMLODIPINE BESYLATE 5 MG PO TABS
5.0000 mg | ORAL_TABLET | Freq: Every day | ORAL | Status: DC
  Administered 2011-12-12 – 2011-12-13 (×2): 5 mg via ORAL
  Filled 2011-12-12 (×4): qty 1

## 2011-12-12 MED ORDER — EZETIMIBE 10 MG PO TABS
10.0000 mg | ORAL_TABLET | Freq: Every day | ORAL | Status: DC
  Administered 2011-12-12 – 2011-12-15 (×4): 10 mg via ORAL
  Filled 2011-12-12 (×4): qty 1

## 2011-12-12 MED ORDER — MIDAZOLAM HCL 5 MG/5ML IJ SOLN
INTRAMUSCULAR | Status: DC | PRN
  Administered 2011-12-12: 1 mg via INTRAVENOUS

## 2011-12-12 MED ORDER — WARFARIN SODIUM 5 MG PO TABS
5.0000 mg | ORAL_TABLET | Freq: Once | ORAL | Status: AC
  Administered 2011-12-12: 5 mg via ORAL
  Filled 2011-12-12: qty 1

## 2011-12-12 MED ORDER — ONDANSETRON HCL 4 MG/2ML IJ SOLN: 4.0000 mg | Freq: Four times a day (QID) | INTRAMUSCULAR | Status: DC | PRN

## 2011-12-12 MED ORDER — HYDROCODONE-ACETAMINOPHEN 5-325 MG PO TABS
1.0000 | ORAL_TABLET | ORAL | Status: DC | PRN
  Administered 2011-12-12 – 2011-12-13 (×3): 2 via ORAL
  Filled 2011-12-12 (×3): qty 2

## 2011-12-12 MED ORDER — OXYCODONE HCL 5 MG/5ML PO SOLN: 5.0000 mg | Freq: Once | ORAL | Status: DC | PRN

## 2011-12-12 MED ORDER — SODIUM CHLORIDE 0.9 % IV SOLN
INTRAVENOUS | Status: DC
  Administered 2011-12-12: 10 mL/h via INTRAVENOUS

## 2011-12-12 MED ORDER — DEXTROSE 50 % IV SOLN: 25.0000 mL | Freq: Once | INTRAVENOUS | Status: DC

## 2011-12-12 MED ORDER — METOCLOPRAMIDE HCL 5 MG/ML IJ SOLN: 5.0000 mg | Freq: Three times a day (TID) | INTRAMUSCULAR | Status: DC | PRN

## 2011-12-12 MED ORDER — CINACALCET HCL 30 MG PO TABS
30.0000 mg | ORAL_TABLET | Freq: Every day | ORAL | Status: DC
  Administered 2011-12-13 – 2011-12-15 (×3): 30 mg via ORAL
  Filled 2011-12-12 (×3): qty 1

## 2011-12-12 MED ORDER — ONDANSETRON HCL 4 MG PO TABS: 4.0000 mg | ORAL_TABLET | Freq: Four times a day (QID) | ORAL | Status: DC | PRN

## 2011-12-12 MED ORDER — OXYCODONE-ACETAMINOPHEN 5-325 MG PO TABS: 1.0000 | ORAL_TABLET | ORAL | Status: DC | PRN

## 2011-12-12 MED ORDER — INSULIN ASPART 100 UNIT/ML ~~LOC~~ SOLN
0.0000 [IU] | Freq: Three times a day (TID) | SUBCUTANEOUS | Status: DC
  Administered 2011-12-13 – 2011-12-14 (×2): 1 [IU] via SUBCUTANEOUS
  Administered 2011-12-14: 2 [IU] via SUBCUTANEOUS
  Administered 2011-12-15 (×2): 5 [IU] via SUBCUTANEOUS

## 2011-12-12 MED ORDER — NITROGLYCERIN 0.4 MG SL SUBL
0.4000 mg | SUBLINGUAL_TABLET | SUBLINGUAL | Status: DC | PRN
Start: 2011-12-12 — End: 2011-12-15

## 2011-12-12 MED ORDER — ASPIRIN EC 81 MG PO TBEC
81.0000 mg | DELAYED_RELEASE_TABLET | Freq: Every day | ORAL | Status: DC
  Administered 2011-12-13 – 2011-12-15 (×3): 81 mg via ORAL
  Filled 2011-12-12 (×3): qty 1

## 2011-12-12 MED ORDER — ONDANSETRON HCL 4 MG/2ML IJ SOLN
INTRAMUSCULAR | Status: DC | PRN
  Administered 2011-12-12: 4 mg via INTRAVENOUS

## 2011-12-12 MED ORDER — SEVELAMER CARBONATE 800 MG PO TABS
800.0000 mg | ORAL_TABLET | Freq: Three times a day (TID) | ORAL | Status: DC
  Administered 2011-12-13: 800 mg via ORAL
  Filled 2011-12-12 (×4): qty 1

## 2011-12-12 MED ORDER — PROPOFOL 10 MG/ML IV BOLUS
INTRAVENOUS | Status: DC | PRN
  Administered 2011-12-12: 120 mg via INTRAVENOUS

## 2011-12-12 MED ORDER — CEFAZOLIN SODIUM-DEXTROSE 2-3 GM-% IV SOLR
2.0000 g | INTRAVENOUS | Status: AC
  Administered 2011-12-12: 1 g via INTRAVENOUS

## 2011-12-12 MED ORDER — ONDANSETRON HCL 4 MG/2ML IJ SOLN: 4.0000 mg | Freq: Once | INTRAMUSCULAR | Status: DC | PRN

## 2011-12-12 MED ORDER — SODIUM CHLORIDE 0.9 % IV SOLN
INTRAVENOUS | Status: DC | PRN
  Administered 2011-12-12: 17:00:00 via INTRAVENOUS

## 2011-12-12 MED ORDER — CINACALCET HCL 30 MG PO TABS
90.0000 mg | ORAL_TABLET | Freq: Every evening | ORAL | Status: DC
  Administered 2011-12-12 – 2011-12-14 (×3): 90 mg via ORAL
  Filled 2011-12-12 (×4): qty 3

## 2011-12-12 MED ORDER — PANTOPRAZOLE SODIUM 40 MG PO TBEC
40.0000 mg | DELAYED_RELEASE_TABLET | Freq: Every day | ORAL | Status: DC
  Administered 2011-12-13 – 2011-12-15 (×3): 40 mg via ORAL
  Filled 2011-12-12 (×3): qty 1

## 2011-12-12 MED ORDER — INSULIN ASPART 100 UNIT/ML ~~LOC~~ SOLN
3.0000 [IU] | Freq: Three times a day (TID) | SUBCUTANEOUS | Status: DC
  Administered 2011-12-15 (×2): 3 [IU] via SUBCUTANEOUS

## 2011-12-12 MED ORDER — ALBUTEROL SULFATE HFA 108 (90 BASE) MCG/ACT IN AERS
2.0000 | INHALATION_SPRAY | RESPIRATORY_TRACT | Status: DC | PRN
  Filled 2011-12-12: qty 6.7

## 2011-12-12 MED ORDER — FENTANYL CITRATE 0.05 MG/ML IJ SOLN
INTRAMUSCULAR | Status: DC | PRN
  Administered 2011-12-12: 50 ug via INTRAVENOUS
  Administered 2011-12-12 (×2): 25 ug via INTRAVENOUS

## 2011-12-12 MED ORDER — COUMADIN BOOK
Freq: Once | Status: AC
  Administered 2011-12-12: 23:00:00
  Filled 2011-12-12: qty 1

## 2011-12-12 MED ORDER — HYDROMORPHONE HCL PF 1 MG/ML IJ SOLN
0.5000 mg | INTRAMUSCULAR | Status: DC | PRN
  Administered 2011-12-13: 1 mg via INTRAVENOUS
  Administered 2011-12-13: 0.5 mg via INTRAVENOUS
  Administered 2011-12-13 – 2011-12-14 (×3): 1 mg via INTRAVENOUS
  Filled 2011-12-12 (×4): qty 1

## 2011-12-12 MED ORDER — POLYVINYL ALCOHOL 1.4 % OP SOLN
1.0000 [drp] | OPHTHALMIC | Status: DC | PRN
  Filled 2011-12-12: qty 15

## 2011-12-12 MED ORDER — INSULIN GLARGINE 100 UNIT/ML ~~LOC~~ SOLN
6.0000 [IU] | Freq: Every day | SUBCUTANEOUS | Status: DC
  Administered 2011-12-12 – 2011-12-13 (×2): 6 [IU] via SUBCUTANEOUS
  Administered 2011-12-15: via SUBCUTANEOUS

## 2011-12-12 SURGICAL SUPPLY — 44 items
BANDAGE ESMARK 6X9 LF (GAUZE/BANDAGES/DRESSINGS) ×1 IMPLANT
BANDAGE GAUZE ELAST BULKY 4 IN (GAUZE/BANDAGES/DRESSINGS) ×2 IMPLANT
BLADE SAW RECIP 87.9 MT (BLADE) ×2 IMPLANT
BLADE SURG 21 STRL SS (BLADE) ×2 IMPLANT
BNDG COHESIVE 6X5 TAN STRL LF (GAUZE/BANDAGES/DRESSINGS) ×2 IMPLANT
BNDG ESMARK 6X9 LF (GAUZE/BANDAGES/DRESSINGS) ×2
CLOTH BEACON ORANGE TIMEOUT ST (SAFETY) ×2 IMPLANT
COVER SURGICAL LIGHT HANDLE (MISCELLANEOUS) ×2 IMPLANT
CUFF TOURNIQUET SINGLE 34IN LL (TOURNIQUET CUFF) IMPLANT
CUFF TOURNIQUET SINGLE 44IN (TOURNIQUET CUFF) ×2 IMPLANT
DRAIN PENROSE 1/2X12 LTX STRL (WOUND CARE) IMPLANT
DRAPE EXTREMITY T 121X128X90 (DRAPE) ×2 IMPLANT
DRAPE PROXIMA HALF (DRAPES) ×4 IMPLANT
DRAPE U-SHAPE 47X51 STRL (DRAPES) ×4 IMPLANT
DRSG ADAPTIC 3X8 NADH LF (GAUZE/BANDAGES/DRESSINGS) ×2 IMPLANT
DRSG PAD ABDOMINAL 8X10 ST (GAUZE/BANDAGES/DRESSINGS) ×6 IMPLANT
DURAPREP 26ML APPLICATOR (WOUND CARE) ×2 IMPLANT
ELECT REM PT RETURN 9FT ADLT (ELECTROSURGICAL) ×2
ELECTRODE REM PT RTRN 9FT ADLT (ELECTROSURGICAL) ×1 IMPLANT
EVACUATOR 1/8 PVC DRAIN (DRAIN) IMPLANT
GLOVE BIOGEL PI IND STRL 9 (GLOVE) ×1 IMPLANT
GLOVE BIOGEL PI INDICATOR 9 (GLOVE) ×1
GLOVE SURG ORTHO 9.0 STRL STRW (GLOVE) ×2 IMPLANT
GOWN PREVENTION PLUS XLARGE (GOWN DISPOSABLE) ×2 IMPLANT
GOWN SRG XL XLNG 56XLVL 4 (GOWN DISPOSABLE) ×1 IMPLANT
GOWN STRL NON-REIN XL XLG LVL4 (GOWN DISPOSABLE) ×1
KIT BASIN OR (CUSTOM PROCEDURE TRAY) ×2 IMPLANT
KIT ROOM TURNOVER OR (KITS) ×2 IMPLANT
MANIFOLD NEPTUNE II (INSTRUMENTS) ×2 IMPLANT
NS IRRIG 1000ML POUR BTL (IV SOLUTION) ×2 IMPLANT
PACK GENERAL/GYN (CUSTOM PROCEDURE TRAY) ×2 IMPLANT
PAD ARMBOARD 7.5X6 YLW CONV (MISCELLANEOUS) ×4 IMPLANT
SPONGE GAUZE 4X4 12PLY (GAUZE/BANDAGES/DRESSINGS) ×2 IMPLANT
SPONGE LAP 18X18 X RAY DECT (DISPOSABLE) IMPLANT
STAPLER VISISTAT 35W (STAPLE) ×2 IMPLANT
STOCKINETTE IMPERVIOUS LG (DRAPES) ×2 IMPLANT
SUT PDS AB 1 CT  36 (SUTURE) ×2
SUT PDS AB 1 CT 36 (SUTURE) ×2 IMPLANT
SUT SILK 2 0 (SUTURE) ×1
SUT SILK 2-0 18XBRD TIE 12 (SUTURE) ×1 IMPLANT
TOWEL OR 17X24 6PK STRL BLUE (TOWEL DISPOSABLE) ×2 IMPLANT
TOWEL OR 17X26 10 PK STRL BLUE (TOWEL DISPOSABLE) ×2 IMPLANT
TUBE ANAEROBIC SPECIMEN COL (MISCELLANEOUS) IMPLANT
WATER STERILE IRR 1000ML POUR (IV SOLUTION) ×2 IMPLANT

## 2011-12-12 NOTE — Transfer of Care (Signed)
Immediate Anesthesia Transfer of Care Note  Patient: Faith [name]  Procedure(s) Performed: Procedure(s) (LRB) with comments: AMPUTATION BELOW KNEE (Right)  Patient Location: PACU  Anesthesia Type: General  Level of Consciousness: awake, alert , oriented and patient cooperative  Airway & Oxygen Therapy: Patient Spontanous Breathing and Patient connected to nasal cannula oxygen  Post-op Assessment: Report given to PACU RN, Post -op Vital signs reviewed and stable and Patient moving all extremities  Post vital signs: Reviewed and stable  Complications: No apparent anesthesia complications

## 2011-12-12 NOTE — Preoperative (Signed)
Beta Blockers   Reason not to administer Beta Blockers:Not Applicable 

## 2011-12-12 NOTE — Progress Notes (Signed)
AVF has + bruit and thrill

## 2011-12-12 NOTE — Op Note (Signed)
OPERATIVE REPORT  DATE OF SURGERY: 12/12/2011  PATIENT:  Faith [name],  56 y.o. female  PRE-OPERATIVE DIAGNOSIS:  Gangrene Right Foot  POST-OPERATIVE DIAGNOSIS:  Gangrene Right Foot  PROCEDURE:  Procedure(s): AMPUTATION BELOW KNEE Right transtibial amputation  SURGEON:  Surgeon(s): Nadara Mustard, MD  ANESTHESIA:   general  EBL:  Minimal ML  SPECIMEN:  Source of Specimen:  Right leg  TOURNIQUET:   Total Tourniquet Time Documented: Thigh (Right) - 10 minutes  PROCEDURE DETAILS: Patient is a 56 year old woman diabetic end-stage renal disease on dialysis who presents after failure of foot salvage surgery with necrosis of the right forefoot. Due to failure to and progressive gangrenous changes to the right forefoot patient presents at this time for transtibial amputation. Risks and benefits were discussed including infection neurovascular injury persistent pain DVT need for additional surgery. Patient states she understands and wished to proceed at this time. Description of procedure patient was brought to the operating room and underwent a general anesthetic. After adequate levels of anesthesia were obtained patient's right lower extremity was prepped using DuraPrep and draped into a sterile field. A transverse incision was made 11 cm distal to the tibial tubercle this curved proximally and a large posterior flap was created. The tibia was transected 1 cm proximal to the skin incision this was beveled anteriorly and the fibula was transected just proximal to the tibial incision. Amputation knife was used to create a large posterior flap. The sciatic nerve was pulled cut and allowed to retract the vascular bundles were suture ligated with 2-0 silk. The tourniquet was deflated hemostasis was obtained. The deep and superficial fascial layers were closed using #1 PDS the skin was closed using staples. Wound was covered with Adaptic orthopedic sponges AB dressing Kerlix and Coban. Patient was  extubated taken to the PACU in stable condition.  PLAN OF CARE: Admit to inpatient   PATIENT DISPOSITION:  PACU - hemodynamically stable.   Nadara Mustard, MD 12/12/2011 6:48 PM

## 2011-12-12 NOTE — Progress Notes (Signed)
Pt has upper arm AVF auscultated a bruit and palpated a thrill dressings remain on .

## 2011-12-12 NOTE — Progress Notes (Signed)
ANTICOAGULATION CONSULT NOTE - Initial Consult  Pharmacy Consult for Coumadin Indication: VTE prophylaxis  Allergies  Allergen Reactions  . Metoclopramide Hcl Other (See Comments)    Tardive dyskinesia  . Codeine Nausea And Vomiting  . Lipitor (Atorvastatin Calcium) Other (See Comments)    Muscle weakness    Patient Measurements: Height: 5' 4.5" (163.8 cm) Weight: 166 lb (75.297 kg) (bed scale) IBW/kg (Calculated) : 55.85  Heparin Dosing Weight:   Vital Signs: Temp: 98.3 F (36.8 C) (10/21 2133) Temp src: Oral (10/21 2133) BP: 123/42 mmHg (10/21 2133) Pulse Rate: 76  (10/21 2133)  Labs:  Basename 12/12/11 1414  HGB 10.8*  HCT 33.2*  PLT 344  APTT 51*  LABPROT 17.2*  INR 1.44  HEPARINUNFRC --  CREATININE 3.09*  CKTOTAL --  CKMB --  TROPONINI --    Estimated Creatinine Clearance: 20.2 ml/min (by C-G formula based on Cr of 3.09).   Medical History: Past Medical History  Diagnosis Date  . Hyperparathyroidism, secondary renal   . CAD (coronary artery disease) of artery bypass graft     CABG in 2002  . PAD (peripheral artery disease)     Not a candidate for revascularization  . Chronic constipation   . GERD (gastroesophageal reflux disease)   . Anemia of chronic renal failure   . Tardive dyskinesia     Secondary to Reglan  . Depression   . Neuropathy   . Diabetic retinopathy(362.0)   . Diastolic CHF, chronic     Grade 2  . Pulmonary embolism 06/2003  . Hyperlipidemia   . Myocardial infarction 2002  . Stroke 2002    "w/left side" residual weakness  . Dialysis patient 01/17/11    Faith [name]; M, W, F; via right upper arm AVF  . Diabetes mellitus     type II  . CHF (congestive heart failure)   . CVA (cerebral infarction)     right brain with minimal left hemiparesis  . Pneumonia 10/2009; 11/2009    ?pneumonia 01/17/11  . Hypertension   . Arthritis   . Complication of anesthesia     "sleepy afterwards; due to her being dialysis pt"  . ESRD (end  stage renal disease) on dialysis     M-W-Fr  Faith [name]    Medications:  Scheduled:    . amLODipine  5 mg Oral QHS  . aspirin EC  81 mg Oral Daily  .  ceFAZolin (ANCEF) IV  2 g Intravenous 60 min Pre-Op  . cinacalcet  30 mg Oral Daily  . cinacalcet  90 mg Oral QPM  . coumadin book   Does not apply Once  . ezetimibe  10 mg Oral Daily  . fenofibrate  54 mg Oral Daily  . gabapentin  400 mg Oral QHS  . HYDROmorphone      . HYDROmorphone      . insulin aspart  0-9 Units Subcutaneous TID WC  . insulin aspart  3 Units Subcutaneous TID WC  . insulin glargine  6 Units Subcutaneous QHS  . multivitamin  1 tablet Oral QHS  . pantoprazole  40 mg Oral Q1200  . sevelamer  800 mg Oral TID WC  . warfarin  5 mg Oral Once  . warfarin   Does not apply Once  . Warfarin - Pharmacist Dosing Inpatient   Does not apply q1800  . DISCONTD: dextrose  25 mL Intravenous Once    Assessment: 56 yr old female, ESRD on HD, was admitted for a below the  knee amputation due to gangrene. She is to start coumadin for VTE prophylaxis. She says she does not take coumadin at home but her INR is elevated at 1.44. Will dose her cautiously as she may be sensitive to coumadin.  Goal of Therapy:  INR 2-3 Monitor platelets by anticoagulation protocol: Yes   Plan:  1) Coumadin 5 mg tonight 2) Daily PT/INR 3) Ordered coumadin booklet and video for pt.  Faith [name] 12/12/2011,9:54 PM

## 2011-12-12 NOTE — Progress Notes (Signed)
Left messsage at [facility] of the [facility] to call back to confirm the last dose of each of patient's medications.

## 2011-12-12 NOTE — H&P (Signed)
Faith [name] is an 56 y.o. female.   Chief Complaint: Gangrene right forefoot HPI: Patient is a 56 year old woman with diabetes end-stage renal disease on dialysis who presents status post a second ray amputation do to osteomyelitis and infection. On examination patient has developed progressive gangrenous changes to her forefoot. She has had a temperature over the weekend she complains of pain odor and increasing necrotic wound over the forefoot.  Past Medical History  Diagnosis Date  . Hyperparathyroidism, secondary renal   . CAD (coronary artery disease) of artery bypass graft     CABG in 2002  . PAD (peripheral artery disease)     Not a candidate for revascularization  . Chronic constipation   . GERD (gastroesophageal reflux disease)   . Anemia of chronic renal failure   . Tardive dyskinesia     Secondary to Reglan  . Depression   . Neuropathy   . Diabetic retinopathy(362.0)   . Diastolic CHF, chronic     Grade 2  . Pulmonary embolism 06/2003  . Hyperlipidemia   . Myocardial infarction 2002  . Stroke 2002    "w/left side" residual weakness  . Dialysis patient 01/17/11    [name]; M, W, F; via right upper arm AVF  . Diabetes mellitus     type II  . CHF (congestive heart failure)   . CVA (cerebral infarction)     right brain with minimal left hemiparesis  . Pneumonia 10/2009; 11/2009    ?pneumonia 01/17/11  . Hypertension   . Arthritis   . Complication of anesthesia     "sleepy afterwards; due to her being dialysis pt"  . ESRD (end stage renal disease) on dialysis     M-W-Fr  [name]    Past Surgical History  Procedure Date  . Coronary artery bypass graft 2002  . Laparoscopic cholecystectomy 2002  . Av fistula placement     times 4-using rt upper arm graft-old rt lower arm,lt upper and lower old grafts  . Trigger finger release   . Tubal ligation   . Eye examination under anesthesia w/ retinal cryotherapy and retinal laser   . Cataract extraction  07/2010    right sided   . Toe amputation 5/12    left 2nd toe secondary to osteomyelitis  . Breast biopsy 06/2010  . Carpal tunnel release 05/05/2011    Procedure: CARPAL TUNNEL RELEASE;  Surgeon: Sharma Covert, MD;  Location: [facility];  Service: Orthopedics;  Laterality: Right;  . Coronary angioplasty with stent placement   . Amputation 11/24/2011    Procedure: AMPUTATION RAY;  Surgeon: Nadara Mustard, MD;  Location: [facility] OR;  Service: Orthopedics;  Laterality: Right;  Right Foot 2nd Ray Amputation    Family History  Problem Relation Age of Onset  . Diabetes Mother   . Cirrhosis Mother   . Diabetes Father   . Heart attack Father   . Hyperlipidemia Father   . Heart disease Father   . Diabetes Sister   . Hyperlipidemia Sister   . Hypertension Sister   . Diabetes Sister   . Diabetes Sister   . Heart disease Brother   . Hyperlipidemia Brother    Social History:  reports that she has never smoked. She has never used smokeless tobacco. She reports that she does not drink alcohol or use illicit drugs.  Allergies:  Allergies  Allergen Reactions  . Codeine Nausea And Vomiting  . Lipitor (Atorvastatin Calcium) Other (See Comments)  Muscle weakness  . Metoclopramide Hcl Other (See Comments)    Unknown rxn    Medications Prior to Admission  Medication Sig Dispense Refill  . acetaminophen (TYLENOL ARTHRITIS PAIN) 650 MG CR tablet Take 650 mg by mouth every 8 (eight) hours as needed. For pain      . albuterol (PROVENTIL HFA;VENTOLIN HFA) 108 (90 BASE) MCG/ACT inhaler Inhale 2 puffs into the lungs every 4 (four) hours as needed. For shortness of breath      . amLODipine (NORVASC) 5 MG tablet Take 5 mg by mouth at bedtime.       Marland Kitchen aspirin EC 81 MG tablet Take 81 mg by mouth daily.      . cinacalcet (SENSIPAR) 30 MG tablet Take 30 mg by mouth daily. Takes 30mg  in the morning and 90mg  at night      . cinacalcet (SENSIPAR) 90 MG tablet Take 90 mg by mouth every evening.  Takes 30mg  in the morning and 90mg  at night      . ezetimibe (ZETIA) 10 MG tablet Take 10 mg by mouth daily.      . fenofibrate micronized (LOFIBRA) 134 MG capsule Take 134 mg by mouth daily.       Marland Kitchen gabapentin (NEURONTIN) 400 MG capsule Take 400 mg by mouth at bedtime.       Marland Kitchen guaiFENesin (ROBITUSSIN) 100 MG/5ML SOLN Take 15 mLs by mouth every 6 (six) hours as needed. For coughing/wheezing      . HYDROcodone-acetaminophen (VICODIN) 5-500 MG per tablet Take 1 tablet by mouth 2 (two) times daily as needed. For pain      . insulin aspart (NOVOLOG) 100 UNIT/ML injection Inject 4-12 Units into the skin 3 (three) times daily before meals. Per sliding scale      . insulin glargine (LANTUS) 100 UNIT/ML injection Inject 6 Units into the skin at bedtime.       . multivitamin (RENA-VIT) TABS tablet Take 1 tablet by mouth daily.       . nitroGLYCERIN (NITROSTAT) 0.4 MG SL tablet Place 0.4 mg under the tongue every 5 (five) minutes as needed. For chest pain      . pantoprazole (PROTONIX) 40 MG tablet Take 40 mg by mouth at bedtime.       . polyvinyl alcohol (LIQUIFILM TEARS) 1.4 % ophthalmic solution Place 1 drop into both eyes daily as needed. For dryness      . sevelamer (RENAGEL) 800 MG tablet Take 1,600-3,200 mg by mouth See admin instructions. Take 4 tablets with meals and 2 tablets with snacks      . tetrahydrozoline 0.05 % ophthalmic solution Place 1 drop into both eyes daily.      Marland Kitchen DISCONTD: HYDROcodone-acetaminophen (NORCO/VICODIN) 5-325 MG per tablet Take 1-2 tablets by mouth every 4 (four) hours as needed.  30 tablet  0    Results for orders placed during the hospital encounter of 12/12/11 (from the past 48 hour(s))  APTT     Status: Abnormal   Collection Time   12/12/11  2:14 PM      Component Value Range Comment   aPTT 51 (*) 24 - 37 seconds   CBC     Status: Abnormal   Collection Time   12/12/11  2:14 PM      Component Value Range Comment   WBC 14.0 (*) 4.0 - 10.5 K/uL    RBC 4.17   3.87 - 5.11 MIL/uL    Hemoglobin 10.8 (*) 12.0 - 15.0 g/dL  HCT 33.2 (*) 36.0 - 46.0 %    MCV 79.6  78.0 - 100.0 fL    MCH 25.9 (*) 26.0 - 34.0 pg    MCHC 32.5  30.0 - 36.0 g/dL    RDW 16.1 (*) 09.6 - 15.5 %    Platelets 344  150 - 400 K/uL   COMPREHENSIVE METABOLIC PANEL     Status: Abnormal   Collection Time   12/12/11  2:14 PM      Component Value Range Comment   Sodium 138  135 - 145 mEq/L    Potassium 3.4 (*) 3.5 - 5.1 mEq/L    Chloride 93 (*) 96 - 112 mEq/L    CO2 30  19 - 32 mEq/L    Glucose, Bld 167 (*) 70 - 99 mg/dL    BUN 15  6 - 23 mg/dL    Creatinine, Ser 0.45 (*) 0.50 - 1.10 mg/dL    Calcium 9.5  8.4 - 40.9 mg/dL    Total Protein 8.0  6.0 - 8.3 g/dL    Albumin 3.0 (*) 3.5 - 5.2 g/dL    AST 30  0 - 37 U/L    ALT 18  0 - 35 U/L    Alkaline Phosphatase 111  39 - 117 U/L    Total Bilirubin 1.8 (*) 0.3 - 1.2 mg/dL    GFR calc non Af Amer 16 (*) >90 mL/min    GFR calc Af Amer 18 (*) >90 mL/min   PROTIME-INR     Status: Abnormal   Collection Time   12/12/11  2:14 PM      Component Value Range Comment   Prothrombin Time 17.2 (*) 11.6 - 15.2 seconds    INR 1.44  0.00 - 1.49   GLUCOSE, CAPILLARY     Status: Abnormal   Collection Time   12/12/11  2:52 PM      Component Value Range Comment   Glucose-Capillary 144 (*) 70 - 99 mg/dL   GLUCOSE, CAPILLARY     Status: Abnormal   Collection Time   12/12/11  4:40 PM      Component Value Range Comment   Glucose-Capillary 147 (*) 70 - 99 mg/dL    No results found.  Review of Systems  All other systems reviewed and are negative.    Blood pressure 117/41, pulse 94, temperature 101.1 F (38.4 C), temperature source Oral, resp. rate 20, SpO2 98.00%. Physical Exam  On examination patient has a gangrenous ulcer over the dorsum of the forefoot which involves from the second to the third metatarsal. She has an ischemic ulcer over the hindfoot and ischemic changes in her calf. Assessment/Plan Assessment: Progressive  gangrenous changes right forefoot status post limb salvage surgery.  Plan: Due to failure of limb salvage surgery patient presents at this time for a transtibial amputation. Risks and benefits were discussed including persistent infection neurovascular injury nonhealing of the wound need for additional surgery. Patient states she understands and wished to proceed at this time.  Junius Faucett V 12/12/2011, 5:51 PM

## 2011-12-12 NOTE — Anesthesia Preprocedure Evaluation (Addendum)
Anesthesia Evaluation  Patient identified by MRN, date of birth, ID band Patient awake    Reviewed: Allergy & Precautions, H&P , NPO status , Patient's Chart, lab work & pertinent test results, reviewed documented beta blocker date and time   Airway Mallampati: II TM Distance: >3 FB Neck ROM: full    Dental  (+) Dental Advisory Given, Edentulous Upper and Edentulous Lower   Pulmonary pneumonia -, resolved, PE breath sounds clear to auscultation        Cardiovascular hypertension, On Medications + CAD, + Past MI, + Cardiac Stents, + CABG and +CHF Rhythm:regular     Neuro/Psych PSYCHIATRIC DISORDERS Depression CVA, Residual Symptoms    GI/Hepatic negative GI ROS, Neg liver ROS, GERD-  Medicated and Controlled,  Endo/Other  diabetes, Type 2, Insulin Dependent  Renal/GU ESRFRenal disease  negative genitourinary   Musculoskeletal   Abdominal   Peds  Hematology negative hematology ROS (+)   Anesthesia Other Findings See surgeon's H&P   Reproductive/Obstetrics negative OB ROS                        Anesthesia Physical Anesthesia Plan  ASA: IV  Anesthesia Plan: General   Post-op Pain Management:    Induction: Intravenous  Airway Management Planned: LMA  Additional Equipment:   Intra-op Plan:   Post-operative Plan: Extubation in OR  Informed Consent: I have reviewed the patients History and Physical, chart, labs and discussed the procedure including the risks, benefits and alternatives for the proposed anesthesia with the patient or authorized representative who has indicated his/her understanding and acceptance.   Dental Advisory Given  Plan Discussed with: CRNA and Surgeon  Anesthesia Plan Comments:         Anesthesia Quick Evaluation

## 2011-12-12 NOTE — Anesthesia Postprocedure Evaluation (Signed)
  Anesthesia Post-op Note  Patient: Faith [name]  Procedure(s) Performed: Procedure(s) (LRB) with comments: AMPUTATION BELOW KNEE (Right)  Patient Location: PACU  Anesthesia Type: General  Level of Consciousness: awake  Airway and Oxygen Therapy: Patient Spontanous Breathing and Patient connected to nasal cannula oxygen  Post-op Pain: mild  Post-op Assessment: Post-op Vital signs reviewed, Patient's Cardiovascular Status Stable, Respiratory Function Stable, Patent Airway and No signs of Nausea or vomiting  Post-op Vital Signs: Reviewed and stable  Complications: No apparent anesthesia complications

## 2011-12-12 NOTE — Progress Notes (Signed)
Pt sleeping in naps . Less restless .

## 2011-12-13 ENCOUNTER — Encounter (HOSPITAL_COMMUNITY): Payer: Self-pay | Admitting: Orthopedic Surgery

## 2011-12-13 LAB — CBC
HCT: 29.4 % — ABNORMAL LOW (ref 36.0–46.0)
Hemoglobin: 9.3 g/dL — ABNORMAL LOW (ref 12.0–15.0)
MCH: 25.6 pg — ABNORMAL LOW (ref 26.0–34.0)
MCHC: 31.6 g/dL (ref 30.0–36.0)
MCV: 81 fL (ref 78.0–100.0)
Platelets: 303 10*3/uL (ref 150–400)
RBC: 3.63 MIL/uL — ABNORMAL LOW (ref 3.87–5.11)
RDW: 16.7 % — ABNORMAL HIGH (ref 11.5–15.5)
WBC: 7.9 10*3/uL (ref 4.0–10.5)

## 2011-12-13 LAB — BASIC METABOLIC PANEL
BUN: 23 mg/dL (ref 6–23)
CO2: 29 mEq/L (ref 19–32)
Calcium: 9.7 mg/dL (ref 8.4–10.5)
Chloride: 93 mEq/L — ABNORMAL LOW (ref 96–112)
Creatinine, Ser: 4.62 mg/dL — ABNORMAL HIGH (ref 0.50–1.10)
GFR calc Af Amer: 11 mL/min — ABNORMAL LOW (ref >90)
GFR calc non Af Amer: 10 mL/min — ABNORMAL LOW (ref >90)
Glucose, Bld: 145 mg/dL — ABNORMAL HIGH (ref 70–99)
Potassium: 3.7 mEq/L (ref 3.5–5.1)
Sodium: 136 mEq/L (ref 135–145)

## 2011-12-13 LAB — GLUCOSE, CAPILLARY: Glucose-Capillary: 105 mg/dL — ABNORMAL HIGH (ref 70–99)

## 2011-12-13 MED ORDER — ZOLPIDEM TARTRATE 5 MG PO TABS: 5.0000 mg | ORAL_TABLET | Freq: Every evening | ORAL | Status: DC | PRN

## 2011-12-13 MED ORDER — CAMPHOR-MENTHOL 0.5-0.5 % EX LOTN
1.0000 "application " | TOPICAL_LOTION | Freq: Three times a day (TID) | CUTANEOUS | Status: DC | PRN
  Filled 2011-12-13: qty 222

## 2011-12-13 MED ORDER — ONDANSETRON HCL 4 MG PO TABS: 4.0000 mg | ORAL_TABLET | Freq: Four times a day (QID) | ORAL | Status: DC | PRN

## 2011-12-13 MED ORDER — SEVELAMER CARBONATE 800 MG PO TABS
2400.0000 mg | ORAL_TABLET | Freq: Three times a day (TID) | ORAL | Status: DC
  Administered 2011-12-13 – 2011-12-15 (×6): 2400 mg via ORAL
  Filled 2011-12-13 (×9): qty 3

## 2011-12-13 MED ORDER — HYDROXYZINE HCL 25 MG PO TABS: 25.0000 mg | ORAL_TABLET | Freq: Three times a day (TID) | ORAL | Status: DC | PRN

## 2011-12-13 MED ORDER — NEPRO/CARBSTEADY PO LIQD: 237.0000 mL | ORAL | Status: DC | PRN

## 2011-12-13 MED ORDER — SODIUM CHLORIDE 0.9 % IV SOLN: 100.0000 mL | INTRAVENOUS | Status: DC | PRN

## 2011-12-13 MED ORDER — LIDOCAINE HCL (PF) 1 % IJ SOLN: 5.0000 mL | INTRAMUSCULAR | Status: DC | PRN

## 2011-12-13 MED ORDER — GLYCERIN (LAXATIVE) 2.1 G RE SUPP
1.0000 | RECTAL | Status: DC | PRN
  Administered 2011-12-15: via RECTAL
  Filled 2011-12-13 (×2): qty 1

## 2011-12-13 MED ORDER — ACETAMINOPHEN 325 MG PO TABS: 650.0000 mg | ORAL_TABLET | Freq: Four times a day (QID) | ORAL | Status: DC | PRN

## 2011-12-13 MED ORDER — PENTAFLUOROPROP-TETRAFLUOROETH EX AERO: 1.0000 "application " | INHALATION_SPRAY | CUTANEOUS | Status: DC | PRN

## 2011-12-13 MED ORDER — SORBITOL 70 % SOLN
30.0000 mL | Status: DC | PRN
  Administered 2011-12-15: 30 mL via ORAL
  Filled 2011-12-13: qty 30

## 2011-12-13 MED ORDER — LIDOCAINE-PRILOCAINE 2.5-2.5 % EX CREA: 1.0000 "application " | TOPICAL_CREAM | CUTANEOUS | Status: DC | PRN

## 2011-12-13 MED ORDER — WARFARIN SODIUM 5 MG PO TABS
5.0000 mg | ORAL_TABLET | Freq: Once | ORAL | Status: AC
  Administered 2011-12-13: 5 mg via ORAL
  Filled 2011-12-13: qty 1

## 2011-12-13 MED ORDER — DOCUSATE SODIUM 283 MG RE ENEM: 1.0000 | ENEMA | RECTAL | Status: DC | PRN

## 2011-12-13 MED ORDER — CALCIUM CARBONATE 1250 MG/5ML PO SUSP: 500.0000 mg | Freq: Four times a day (QID) | ORAL | Status: DC | PRN

## 2011-12-13 MED ORDER — ACETAMINOPHEN 650 MG RE SUPP: 650.0000 mg | Freq: Four times a day (QID) | RECTAL | Status: DC | PRN

## 2011-12-13 MED ORDER — ONDANSETRON HCL 4 MG/2ML IJ SOLN: 4.0000 mg | Freq: Four times a day (QID) | INTRAMUSCULAR | Status: DC | PRN

## 2011-12-13 MED ORDER — ALTEPLASE 2 MG IJ SOLR
2.0000 mg | Freq: Once | INTRAMUSCULAR | Status: AC | PRN
  Filled 2011-12-13: qty 2

## 2011-12-13 MED ORDER — HEPARIN SODIUM (PORCINE) 1000 UNIT/ML DIALYSIS
1000.0000 [IU] | INTRAMUSCULAR | Status: DC | PRN
  Filled 2011-12-13: qty 1

## 2011-12-13 NOTE — Progress Notes (Signed)
Pt arrived to unit via stretcher from PACU. Pt is A&Ox4 but sedated and drowsy, family with pt. No complaints of pain/distress at this time. Oriented to unit and room. Bed alarm for 1st 24h d/t sedation from surgery, and as a reminder of BKA. No needs voiced at this time, will continue to monitor.

## 2011-12-13 NOTE — Progress Notes (Signed)
OT EVALUATION  Past Medical History  Diagnosis Date  . Hyperparathyroidism, secondary renal   . CAD (coronary artery disease) of artery bypass graft     CABG in 2002  . PAD (peripheral artery disease)     Not a candidate for revascularization  . Chronic constipation   . GERD (gastroesophageal reflux disease)   . Anemia of chronic renal failure   . Tardive dyskinesia     Secondary to Reglan  . Depression   . Neuropathy   . Diabetic retinopathy(362.0)   . Diastolic CHF, chronic     Grade 2  . Pulmonary embolism 06/2003  . Hyperlipidemia   . Myocardial infarction 2002  . Stroke 2002    "w/left side" residual weakness  . Dialysis patient 01/17/11    [name]; M, W, F; via right upper arm AVF  . Diabetes mellitus     type II  . CHF (congestive heart failure)   . CVA (cerebral infarction)     right brain with minimal left hemiparesis  . Pneumonia 10/2009; 11/2009    ?pneumonia 01/17/11  . Hypertension   . Arthritis   . Complication of anesthesia     "sleepy afterwards; due to her being dialysis pt"  . ESRD (end stage renal disease) on dialysis     M-W-Fr  [name]   Past Surgical History  Procedure Date  . Coronary artery bypass graft 2002  . Laparoscopic cholecystectomy 2002  . Av fistula placement     times 4-using rt upper arm graft-old rt lower arm,lt upper and lower old grafts  . Trigger finger release   . Tubal ligation   . Eye examination under anesthesia w/ retinal cryotherapy and retinal laser   . Cataract extraction 07/2010    right sided   . Toe amputation 5/12    left 2nd toe secondary to osteomyelitis  . Breast biopsy 06/2010  . Carpal tunnel release 05/05/2011    Procedure: CARPAL TUNNEL RELEASE;  Surgeon: Sharma Covert, MD;  Location: [facility];  Service: Orthopedics;  Laterality: Right;  . Coronary angioplasty with stent placement   . Amputation 11/24/2011    Procedure: AMPUTATION RAY;  Surgeon: Nadara Mustard, MD;  Location: [facility]  OR;  Service: Orthopedics;  Laterality: Right;  Right Foot 2nd Ray Amputation    12/13/11 1000  OT Visit Information  Last OT Received On 12/13/11  Assistance Needed +2  PT/OT Co-Evaluation/Treatment Yes  OT Time Calculation  OT Start Time 0948  OT Stop Time 1018  OT Time Calculation (min) 30 min  Subjective Data  Subjective i want to go home.  Precautions  Precautions Fall  Precaution Comments s/p CVA  with L hemiparesis  Restrictions  Weight Bearing Restrictions Yes  RLE Weight Bearing NWB  Home Living  Lives With Spouse  Available Help at Discharge Family;Personal care attendant;Available PRN/intermittently (PACE program)  Type of Home House  Home Access Level entry  Home Layout One level  Bathroom Shower/Tub Programmer, multimedia Yes  How Accessible Accessible via walker  Home Adaptive Equipment Bedside commode/3-in-1;Walker - rolling;Wheelchair - manual;Tub transfer bench  Prior Function  Level of Independence Needs assistance (Able to walk with assistive device,ascend 1step prior to BKA)  Needs Assistance Bathing;Dressing  Bath Minimal  Dressing Minimal  Able to Take Stairs? No  Driving No  Communication  Communication No difficulties  ADL  Eating/Feeding Performed;Modified independent  Where Assessed - Eating/Feeding Bed level  Grooming Performed;Minimal assistance  Where Assessed - Grooming Supported sitting  Upper Body Bathing Performed;Moderate assistance  Where Assessed - Upper Body Bathing Supported sitting  Lower Body Bathing Performed;Maximal assistance  Where Assessed - Lower Body Bathing Rolling right and/or left  Upper Body Dressing Performed;Minimal assistance  Where Assessed - Upper Body Dressing Supported sitting  Lower Body Dressing Performed;+1 Total assistance  Where Assessed - Lower Body Dressing Rolling right and/or left  Toilet Transfer Performed;+2 Total assistance  Toilet Transfer: Patient  Percentage 30%  Toilet Transfer Method Sit to stand;Stand pivot  Toilet Transfer Equipment Other (comment) (bed - chair)  Toileting - Clothing Manipulation and Hygiene Performed;+1 Total assistance  Equipment Used Gait belt;Rolling walker  Transfers/Ambulation Related to ADLs total A +2  ADL Comments Aid assists with ADL PTA - Min A. Pt transferred into shower using tub bench. functional decline s/p BKA.  Vision - History  Baseline Vision Other (comment)  Visual History Other (comment)  Patient Visual Report Blurring of vision;Other (comment) (unknown)  Cognition  Overall Cognitive Status Impaired  Area of Impairment Safety/judgement  Arousal/Alertness Awake/alert  Orientation Level Appears intact for tasks assessed  Behavior During Session [facility] for tasks performed  Safety/Judgement Decreased safety judgement for tasks assessed;Decreased awareness of need for assistance;Impulsive  Safety/Judgement - Other Comments cues for problem solving basic transfers, impulsive with transfers and needs cues for safety  Right Upper Extremity Assessment  RUE ROM/Strength/Tone [facility] for tasks assessed  RUE Sensation Deficits;History of peripheral neuropathy  RUE Coordination Deficits;WFL - fine motor  RUE Coordination Deficits decreased in hand manipulation  Left Upper Extremity Assessment  LUE ROM/Strength/Tone Deficits  LUE ROM/Strength/Tone Deficits residual tone and weakness. less functional distally  LUE Sensation Deficits;History of peripheral neuropathy  LUE Coordination Deficits  LUE Coordination Deficits decreased gross and fine motor  Right Lower Extremity Assessment  RLE ROM/Strength/Tone Deficits  RLE ROM/Strength/Tone Deficits s/p BKA; painful with motion; +quad activation, and able to achieve at or near full knee extension with multiple reps, and max cues (verbal and gentle tactile cueing)  Left Lower Extremity Assessment  LLE ROM/Strength/Tone WFL for tasks assessed (grossly )  LLE  ROM/Strength/Tone Deficits residual hemiparesis  Trunk Assessment  Trunk Assessment Kyphotic  Bed Mobility  Bed Mobility Rolling Left;Left Sidelying to Sit;Sitting - Scoot to [place] of Bed  Rolling Right 3: Mod assist  Rolling Left 3: Mod assist  Left Sidelying to Sit 4: Min assist;HOB flat  Supine to Sit 2: Max assist;HOB flat  Sitting - Scoot to [place] of Bed 3: Mod assist;With rail  Details for Bed Mobility Assistance cues for initiation and for technique; physical assist to acheive completion of rolling fully onto side right and left, cues to flex hips with rolling  Transfers  Transfers Sit to Stand;Stand to Sit  Sit to Stand 1: +2 Total assist;With upper extremity assist;From bed  Sit to Stand: Patient Percentage 30%  Stand to Sit 1: +2 Total assist;With upper extremity assist;To chair/3-in-1  Stand to Sit: Patient Percentage 20%  Details for Transfer Assistance Difficulty with total wt bearing through LLE. L knee did not achieve full extension  Balance  Balance Assessed Yes  Static Sitting Balance  Static Sitting - Balance Support Bilateral upper extremity supported;Feet supported  Static Sitting - Level of Assistance 5: Stand by assistance  Dynamic Sitting Balance  Dynamic Sitting - Balance Support Feet supported;Right upper extremity supported;Left upper extremity supported  Dynamic Sitting - Level of Assistance 4: Min assist  Dynamic Sitting Balance -  Compensations decreased BOS and vision deficits altering sense of midline  Static Standing Balance  Static Standing - Balance Support Bilateral upper extremity supported;During functional activity  Standardized Balance Assessment  Standardized Balance Assessment (functional tasks)  OT - End of Session  Equipment Utilized During Treatment Gait belt  Activity Tolerance Patient limited by fatigue  Patient left in chair;with call bell/phone within reach  Nurse Communication Mobility status;Precautions;Weight bearing status  OT  Assessment  Clinical Impression Statement 56 yo with 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 Pt with hx of DM, ESRD (dialysis M W F)) who required Min A with basic ADL PTA by PCA. Husband works and pt is home alone at times during the day. Feel pt will benefit from CIR to reach Mod I level with mobility for ADL and min A with ADL, Pt will benefit from skilled Ot services to max independence with ADL and funcitonal mobility for ADL to facilitate D/C to next venue of care.  OT Recommendation/Assessment Patient needs continued OT Services  OT Problem List Decreased strength;Decreased range of motion;Decreased activity tolerance;Impaired balance (sitting and/or standing);Impaired vision/perception;Decreased coordination;Decreased cognition;Decreased safety awareness;Decreased knowledge of use of DME or AE;Decreased knowledge of precautions;Cardiopulmonary status limiting activity;Impaired sensation;Impaired tone;Obesity;Impaired UE functional use;Pain;Increased edema  Barriers to Discharge None  OT Therapy Diagnosis  Generalized weakness;Acute pain  OT Plan  OT Frequency Min 3X/week  OT Treatment/Interventions Self-care/ADL training;Therapeutic exercise;Energy conservation;DME and/or AE instruction;Therapeutic activities;Visual/perceptual remediation/compensation;Patient/family education;Balance training  OT Recommendation  Recommendations for Other Services Rehab consult  Follow Up Recommendations Inpatient Rehab  Equipment Recommended None recommended by OT  Individuals Consulted  Consulted and Agree with Results and Recommendations Patient  Acute Rehab OT Goals  OT Goal Formulation With patient  Time For Goal Achievement 12/27/11  Potential to Achieve Goals Good  ADL Goals  Pt Will Perform Grooming with set-up;Sitting, chair  Pt Will Perform Upper Body Bathing with set-up;Sitting, chair;Supported  Pt Will Perform Lower Dentist with mod assist;Sitting, chair;Unsupported;with adaptive  equipment;with cueing (comment type and amount) (lat leans)  Pt Will Transfer to Toilet with mod assist;with DME;Drop arm 3-in-1;Maintaining weight bearing status;with cueing (comment type and amount)  Pt Will Perform Toileting - Clothing Manipulation with mod assist;Sitting on 3-in-1 or toilet;with cueing (comment type and amount) (lat leans)  Pt Will Perform Toileting - Hygiene with supervision;Sitting on 3-in-1 or toilet;Leaning right and/or left on 3-in-1/toilet  ADL Goal: Grooming - Progress Goal set today  ADL Goal: Upper Body Bathing - Progress Goal set today  ADL Goal: Lower Body Bathing - Progress Goal set today  ADL Goal: Toilet Transfer - Progress Goal set today  ADL Goal: Toileting - Clothing Manipulation - Progress Goal set today  ADL Goal: Toileting - Hygiene - Progress Goal set today  Miscellaneous OT Goals  Miscellaneous OT Goal #1 Pt will verbalize to staff proper positioning of RLE to prevent knee flexxion contracture  OT Goal: Miscellaneous Goal #1 - Progress Goal set today  Miscellaneous OT Goal #2 Pt will demonstrate desensitization R residual limb with min vc to reduce pain and increase independence with ADL and mobility.  OT Goal: Miscellaneous Goal #2 - Progress Goal set today  OT General Charges  $OT Visit 1 Procedure  OT Evaluation  $Initial OT Evaluation Tier I 1 Procedure  OT Treatments  $Self Care/Home Management  23-37 mins  Written Expression  Dominant Hand Right   Luisa Dago, OTR/L  [phone] 12/13/2011

## 2011-12-13 NOTE — Consult Note (Signed)
[facility] Renal Consultation Note    Indication for Consultation:  Management of ESRD/hemodialysis; anemia, hypertension/volume and secondary hyperparathyroidism  HPI: Faith [name] is a 56 y.o. 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 Lajoyce Corners.  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  She had HD yesterday prior to surgery.  She slept poorly last night due to the pain, which is intermittently controlled. She is wearing O2, but does not routinely wear at home.  She denies SOB, chest pain, N, V, D, fever or chills.  She has chronic neuropathic pain of LE, tardive dyskinesia from prior reglan and mild itching around her mouth.  She uses glycerin suppositories for constipation, but also has miralax and sorbitol prn.  She has been nonambulatory the past several weeks per recommendation of Dr. Lajoyce Corners due to right foot wound. Prior to that, she used a walker.  She has gradually been losing weight and appetite is poor. Nepro causes diarrhea.  Past Medical History  Diagnosis Date  . Hyperparathyroidism, secondary renal   . CAD (coronary artery disease) of artery bypass graft     CABG in 2002  . PAD (peripheral artery disease)     Not a candidate for revascularization  . Chronic constipation   . GERD (gastroesophageal reflux disease)   . Anemia of chronic renal failure   . Tardive dyskinesia     Secondary to Reglan  . Depression   . Neuropathy   . Diabetic retinopathy(362.0)   . Diastolic CHF, chronic     Grade 2  . Pulmonary embolism 06/2003  . Hyperlipidemia   . Myocardial infarction 2002  . Stroke 2002    "w/left side" residual weakness  . Dialysis patient 01/17/11    [name]; M, W, F; via right upper arm AVF  . Diabetes mellitus     type II  . CHF (congestive heart failure)   . CVA (cerebral infarction)     right brain with minimal left hemiparesis  . Pneumonia 10/2009; 11/2009    ?pneumonia 01/17/11  . Hypertension   . Arthritis   . Complication of anesthesia     "sleepy afterwards; due to her being dialysis pt"  . ESRD (end stage renal disease) on dialysis     M-W-Fr  [name]   Past Surgical History  Procedure Date  . Coronary artery bypass graft 2002  . Laparoscopic cholecystectomy 2002  . Av fistula placement     times 4-using rt upper arm graft-old rt lower arm,lt upper and lower old grafts  . Trigger finger release   . Tubal ligation   . Eye examination under anesthesia w/ retinal cryotherapy and retinal laser   . Cataract extraction 07/2010    right sided   . Toe amputation 5/12    left 2nd toe secondary to osteomyelitis  . Breast biopsy 06/2010  . Carpal tunnel release 05/05/2011    Procedure: CARPAL TUNNEL RELEASE;  Surgeon: Sharma Covert, MD;  Location: [facility];  Service: Orthopedics;  Laterality: Right;  . Coronary angioplasty with stent placement   . Amputation 11/24/2011    Procedure: AMPUTATION RAY;  Surgeon: Nadara Mustard, MD;  Location: [facility];  Service: Orthopedics;  Laterality: Right;  Right Foot 2nd Ray Amputation   Family History  Problem Relation Age of Onset  . Diabetes Mother   . Cirrhosis Mother   . Diabetes Father   . Heart attack Father   . Hyperlipidemia Father   . Heart disease Father   . Diabetes Sister   . Hyperlipidemia Sister   . Hypertension Sister   . Diabetes Sister   . Diabetes Sister   . Heart disease Brother   . Hyperlipidemia Brother    Social History:  reports that she has never smoked. She has never used smokeless tobacco. She reports that she does not drink alcohol or use illicit drugs. Allergies  Allergen Reactions  . Metoclopramide Hcl Other (See Comments)     Tardive dyskinesia  . Codeine Nausea And Vomiting  . Lipitor (Atorvastatin Calcium) Other (See Comments)    Muscle weakness   Prior to Admission medications   Medication Sig Start Date End Date Taking? Authorizing Provider  acetaminophen (TYLENOL ARTHRITIS PAIN) 650 MG CR tablet Take 650 mg by mouth every 8 (eight) hours as needed. For pain   Yes Historical Provider, MD  albuterol (PROVENTIL HFA;VENTOLIN HFA) 108 (90 BASE) MCG/ACT inhaler Inhale 2 puffs into the lungs every 4 (four) hours as needed. For shortness of breath   Yes Historical Provider, MD  amLODipine (NORVASC) 5 MG tablet Take 5 mg by mouth at bedtime.    Yes Historical Provider, MD  aspirin EC 81 MG tablet Take 81 mg by mouth daily.   Yes Historical Provider, MD  cinacalcet (SENSIPAR) 30 MG tablet Take 30 mg by mouth daily. Takes 30mg  in the morning and 90mg  at night   Yes Historical Provider, MD  cinacalcet (SENSIPAR) 90 MG tablet Take 90 mg by mouth every evening. Takes 30mg  in the morning and 90mg  at night   Yes Historical Provider, MD  ezetimibe (ZETIA) 10 MG tablet Take 10 mg by mouth daily.   Yes Historical Provider, MD  fenofibrate micronized (LOFIBRA) 134 MG capsule Take 134 mg by mouth daily.    Yes Historical Provider, MD  gabapentin (NEURONTIN) 400 MG capsule Take 400 mg by mouth at bedtime.    Yes Historical Provider, MD  guaiFENesin (ROBITUSSIN) 100 MG/5ML SOLN Take 15 mLs by mouth every 6 (six) hours as needed. For coughing/wheezing   Yes Historical Provider, MD  HYDROcodone-acetaminophen (VICODIN) 5-500 MG per tablet Take 1 tablet by mouth 2 (two) times daily as needed. For pain   Yes Historical Provider, MD  insulin aspart (NOVOLOG) 100 UNIT/ML injection Inject 4-12 Units into the skin 3 (three) times daily before meals. Per sliding scale   Yes Historical Provider, MD  insulin glargine (LANTUS) 100 UNIT/ML injection Inject 6 Units into the skin at bedtime.    Yes Historical Provider, MD  multivitamin (RENA-VIT) TABS  tablet Take 1 tablet by mouth daily.    Yes Historical Provider, MD  nitroGLYCERIN (NITROSTAT) 0.4 MG SL tablet Place 0.4 mg under the tongue every 5 (five) minutes as needed. For chest pain   Yes Historical Provider, MD  pantoprazole (PROTONIX) 40 MG tablet Take 40 mg by mouth at bedtime.    Yes Historical Provider, MD  polyvinyl alcohol (LIQUIFILM TEARS) 1.4 % ophthalmic solution Place 1 drop into both eyes daily as needed. For dryness   Yes Historical Provider, MD  sevelamer (RENAGEL) 800 MG tablet Take 1,600-3,200 mg by mouth See admin instructions. Take 4 tablets with meals and 2 tablets with snacks   Yes  Historical Provider, MD  tetrahydrozoline 0.05 % ophthalmic solution Place 1 drop into both eyes daily.   Yes Historical Provider, MD   Current Facility-Administered Medications  Medication Dose Route Frequency Provider Last Rate Last Dose  . 0.9 %  sodium chloride infusion   Intravenous Continuous Nadara Mustard, MD   10 mL/hr at 12/12/11 2246  . albuterol (PROVENTIL HFA;VENTOLIN HFA) 108 (90 BASE) MCG/ACT inhaler 2 puff  2 puff Inhalation Q4H PRN Nadara Mustard, MD      . amLODipine (NORVASC) tablet 5 mg  5 mg Oral QHS Nadara Mustard, MD   5 mg at 12/12/11 2235  . aspirin EC tablet 81 mg  81 mg Oral Daily Nadara Mustard, MD   81 mg at 12/13/11 0943  . ceFAZolin (ANCEF) IVPB 2 g/50 mL premix  2 g Intravenous 60 min Pre-Op Nadara Mustard, MD   1 g at 12/12/11 1806  . cinacalcet (SENSIPAR) tablet 30 mg  30 mg Oral Daily Nadara Mustard, MD   30 mg at 12/13/11 0942  . cinacalcet (SENSIPAR) tablet 90 mg  90 mg Oral QPM Nadara Mustard, MD   90 mg at 12/12/11 2234  . coumadin book   Does not apply Once Nadara Mustard, MD      . ezetimibe (ZETIA) tablet 10 mg  10 mg Oral Daily Nadara Mustard, MD   10 mg at 12/13/11 0943  . fenofibrate tablet 54 mg  54 mg Oral Daily Nadara Mustard, MD   54 mg at 12/13/11 0943  . gabapentin (NEURONTIN) capsule 400 mg  400 mg Oral QHS Nadara Mustard, MD   400 mg at 12/12/11 2235   . HYDROcodone-acetaminophen (NORCO/VICODIN) 5-325 MG per tablet 1-2 tablet  1-2 tablet Oral Q4H PRN Nadara Mustard, MD   2 tablet at 12/13/11 0957  . HYDROmorphone (DILAUDID) 1 MG/ML injection           . HYDROmorphone (DILAUDID) 1 MG/ML injection           . HYDROmorphone (DILAUDID) injection 0.5-1 mg  0.5-1 mg Intravenous Q2H PRN Nadara Mustard, MD   1 mg at 12/13/11 0657  . insulin aspart (novoLOG) injection 0-9 Units  0-9 Units Subcutaneous TID WC Nadara Mustard, MD      . insulin aspart (novoLOG) injection 3 Units  3 Units Subcutaneous TID WC Nadara Mustard, MD      . insulin glargine (LANTUS) injection 6 Units  6 Units Subcutaneous QHS Nadara Mustard, MD   6 Units at 12/12/11 2232  . multivitamin (RENA-VIT) tablet 1 tablet  1 tablet Oral QHS Nadara Mustard, MD   1 tablet at 12/12/11 2234  . naphazoline (NAPHCON) 0.1 % ophthalmic solution 1 drop  1 drop Both Eyes QID PRN Nadara Mustard, MD      . nitroGLYCERIN (NITROSTAT) SL tablet 0.4 mg  0.4 mg Sublingual Q5 min PRN Nadara Mustard, MD      . ondansetron [facility]) tablet 4 mg  4 mg Oral Q6H PRN Nadara Mustard, MD       Or  . ondansetron [facility]) injection 4 mg  4 mg Intravenous Q6H PRN Nadara Mustard, MD      . oxyCODONE-acetaminophen (PERCOCET/ROXICET) 5-325 MG per tablet 1-2 tablet  1-2 tablet Oral Q4H PRN Nadara Mustard, MD      . pantoprazole (PROTONIX) EC tablet 40 mg  40 mg Oral Q1200 Nadara Mustard,  MD      . polyvinyl alcohol (LIQUIFILM TEARS) 1.4 % ophthalmic solution 1 drop  1 drop Both Eyes PRN Nadara Mustard, MD      . sevelamer (RENVELA) tablet 800 mg  800 mg Oral TID WC Nadara Mustard, MD   800 mg at 12/13/11 0818  . warfarin (COUMADIN) tablet 5 mg  5 mg Oral Once Nadara Mustard, MD   5 mg at 12/12/11 2235  . warfarin (COUMADIN) tablet 5 mg  5 mg Oral ONCE-1800 Dannielle Karvonen [place], [place]       Labs: Lab Results  Component Value Date   INR 1.57* 12/13/2011   INR 1.44 12/12/2011   INR 1.32 11/24/2011   Basic Metabolic Panel:  Lab  12/12/11 1414  NA 138  K 3.4*  CL 93*  CO2 30  GLUCOSE 167*  BUN 15  CREATININE 3.09*  CALCIUM 9.5  ALB --  PHOS --   Liver Function Tests:  Lab 12/12/11 1414  AST 30  ALT 18  ALKPHOS 111  BILITOT 1.8*  PROT 8.0  ALBUMIN 3.0*  CBC:  Lab 12/12/11 1414  WBC 14.0*  NEUTROABS --  HGB 10.8*  HCT 33.2*  MCV 79.6  PLT 344  CBG:  Lab 12/13/11 0731 12/12/11 2128 12/12/11 1856 12/12/11 1640 12/12/11 1452  GLUCAP 81 119* 129* 147* 144*   ROS: As per HPI otherwise negative.  Physical Exam: Filed Vitals:   12/12/11 2030 12/12/11 2133 12/13/11 0616 12/13/11 0942  BP: 98/40 123/42 108/53 112/51  Pulse: 78 76 71 76  Temp: 99.8 F (37.7 C) 98.3 F (36.8 C) 98.2 F (36.8 C) 98.2 F (36.8 C)  TempSrc:  Oral Oral Oral  Resp: 13 18 18 18   Height:  5' 4.5" (1.638 m)    Weight:  75.297 kg (166 lb)    SpO2: 97% 100% 98% 98%     General: Sitting in recliner. Well developed, well nourished, uncomfortable, occasionally wincing in pain. Head: Normocephalic, atraumatic, sclera muddy mucus, dentures, membranes are moist Neck: Supple. JVD not elevated. Lungs: Clear bilaterally to auscultation without wheezes, rales, or rhonchi. Breathing is unlabored. Heart: RRR with S1 S2. No murmurs, rubs, or gallops appreciated. Abdomen: Soft, non-tender, non-distended with normoactive bowel sounds. Lower extremities: right BKA wrapped: left LE with TEDs stocking and no edema or ischemic changes Neuro: Alert and oriented X 3.  Psych:  Responds to questions appropriately with a normal affect. Dialysis Access:right upper AVGG + bruit  Dialysis Orders: Center: [facility]   on MWF Optiflux 180 EDW 78.5 HD Bath 2K 2.0 Ca  Time3.5  Heparin 7000. Access right upper AVGG BFR 500 DFR 800    Zemplar 18 mcg IV/HD Epogen 1000   Units IV/HD  Venofer none Other profile 4; ferritins consistently in the 2000s and tsats in the 20s  Assessment/Plan: 1.  s/p right BKA - -per Dr. Lajoyce Corners - no heparin HD Wed 2.  ESRD -   MWF - usual orders written for Wed - re-eval EDW for d/c; K low yesterday - post HD; BMP ordered 3.  Hypertension/volume  - usually runs 100 - 120s on HD; on low dose norvasc at HS 4.  Anemia  - check Hgb today;  Labs yesterday will post HD; will decide on ESA for Wed pending results 5.  Metabolic bone disease -  Prone to hypercalcemia due to high dose zemplar - on 2 Ca bath; rechecking Ca today and Wed; continue sensipar and zemplar; iPTH 500s in  September 6.  Nutrition - changed to high protein renal diet; add supplements; Alb 3.0 down from 4.8 9/18.  She has been gradually losing weight - liking due to chronic nonhealing wounds/infx 7. VTE prophylaxis - coumadin started ? Duration 8.   Diabetes mellitus - Lantus + SSI; accuchecks ok  Sheffield Slider, PA-C [facility] Beeper [phone] 12/13/2011, 11:14 AM   Patient seen and examined and agree with assessment and plan as above. 56 yo ESRD pt due to DM/HTN on HD for 12 years, last year had L 2nd toe amp which healed well. On 10/3 had R foot 2nd ray amputation but this did not heal well with progressive gangrene, and patient was admitted for R BKA which was done by Dr. Lajoyce Corners yesterday. She is stable postop, on exam she is alert, no LE edema, patent AVF, clear lungs and weight is 3 kg below dry weight (done after BKA yesterday). Plan dialysis tomorrow. Will follow.  Vinson Moselle  MD [employer] [phone] pgr    [phone] cell 12/13/2011, 4:22 PM

## 2011-12-13 NOTE — Progress Notes (Signed)
Subjective: 1 Day Post-Op Procedure(s) (LRB): AMPUTATION BELOW KNEE (Right) Patient reports pain as mild.    Objective: Vital signs in last 24 hours: Temp:  [97.8 F (36.6 C)-101.1 F (38.4 C)] 98.2 F (36.8 C) (10/22 0616) Pulse Rate:  [71-94] 71  (10/22 0616) Resp:  [13-25] 18  (10/22 0616) BP: (93-123)/(6-73) 108/53 mmHg (10/22 0616) SpO2:  [91 %-100 %] 98 % (10/22 0616) Weight:  [75.297 kg (166 lb)] 75.297 kg (166 lb) (10/21 2133)  Intake/Output from previous day: 10/21 0701 - 10/22 0700 In: 454.3 [P.O.:200; I.V.:254.3] Out: -  Intake/Output this shift: Total I/O In: 254.3 [P.O.:200; I.V.:54.3] Out: -    Basename 12/12/11 1414  HGB 10.8*    Basename 12/12/11 1414  WBC 14.0*  RBC 4.17  HCT 33.2*  PLT 344    Basename 12/12/11 1414  NA 138  K 3.4*  CL 93*  CO2 30  BUN 15  CREATININE 3.09*  GLUCOSE 167*  CALCIUM 9.5    Basename 12/12/11 1414  LABPT --  INR 1.44    Incision: dressing C/D/I  Assessment/Plan: 1 Day Post-Op Procedure(s) (LRB): AMPUTATION BELOW KNEE (Right) Discharge home with home health  Sukhdeep Wieting V 12/13/2011, 6:37 AM

## 2011-12-13 NOTE — Progress Notes (Signed)
Rehab Admissions Coordinator Note:  Patient was screened by Lutricia Horsfall,  for appropriateness for an Inpatient Acute Rehab Consult.  At this time, we are recommending Inpatient Rehab consult.  Meryl Dare 12/13/2011, 12:04 PM  I can be reached at [phone].

## 2011-12-13 NOTE — Progress Notes (Signed)
ANTICOAGULATION CONSULT NOTE - Follow Up Consult  Pharmacy Consult for Coumadin Indication: VTE prophylaxis  Allergies  Allergen Reactions  . Metoclopramide Hcl Other (See Comments)    Tardive dyskinesia  . Codeine Nausea And Vomiting  . Lipitor (Atorvastatin Calcium) Other (See Comments)    Muscle weakness    Patient Measurements: Height: 5' 4.5" (163.8 cm) Weight: 166 lb (75.297 kg) (bed scale) IBW/kg (Calculated) : 55.85  Heparin Dosing Weight:   Vital Signs: Temp: 98.2 F (36.8 C) (10/22 0942) Temp src: Oral (10/22 0942) BP: 112/51 mmHg (10/22 0942) Pulse Rate: 76  (10/22 0942)  Labs:  Basename 12/13/11 0620 12/12/11 1414  HGB -- 10.8*  HCT -- 33.2*  PLT -- 344  APTT -- 51*  LABPROT 18.3* 17.2*  INR 1.57* 1.44  HEPARINUNFRC -- --  CREATININE -- 3.09*  CKTOTAL -- --  CKMB -- --  TROPONINI -- --    Estimated Creatinine Clearance: 20.2 ml/min (by C-G formula based on Cr of 3.09).  Assessment: 56yof on Coumadin for VTE prophylaxis s/p BKA. INR (1.57) is subtherapeutic as expected following Coumadin 5mg  x 1 - will repeat dose. - No CBC this AM - No significant bleeding reported  Goal of Therapy:  INR 2-3   Plan:  1. Repeat Coumadin 5mg  po x 1 today 2. Follow-up AM INR  Cleon Dew [phone] 12/13/2011,10:47 AM

## 2011-12-13 NOTE — Progress Notes (Signed)
Utilization review completed.  

## 2011-12-13 NOTE — Progress Notes (Signed)
Physical Therapy Evaluation Patient Details Name: Faith [name] MRN: [id number] DOB: 10-12-1955 Today's Date: 12/13/2011 Time: 4098-1191 PT Time Calculation (min): 32 min  PT Assessment / Plan / Recommendation Clinical Impression  56 yo female s/p R BKA secondary to gangrene; with other comorbidities including ESRD, DM; Presents with decr functional mobility; Will benefit from PT to address mobilty deficits;   Has lots of assist at home, but will be alone from time to time; For this reason, recommend post acute inpatient rehab to maximize independence and safety with mobility prior to dc home    PT Assessment  Patient needs continued PT services    Follow Up Recommendations  Post acute inpatient    Does the patient have the potential to tolerate intense rehabilitation   Yes, Recommend IP Rehab Screening  Barriers to Discharge Decreased caregiver support      Equipment Recommendations  None recommended by PT    Recommendations for Other Services Rehab consult   Frequency Min 3X/week    Precautions / Restrictions Precautions Precautions: Fall Precaution Comments: s/p CVA  with L hemiparesis Restrictions Weight Bearing Restrictions: Yes RLE Weight Bearing: Non weight bearing   Pertinent Vitals/Pain Pain limiting ROM RLE, especially knee; no severity given, but RN came to give pain meds      Mobility  Bed Mobility Bed Mobility: Rolling Left;Left Sidelying to Sit;Sitting - Scoot to [place] of Bed Rolling Right: 3: Mod assist Rolling Left: 3: Mod assist Left Sidelying to Sit: 4: Min assist;HOB flat Supine to Sit: 2: Max assist;HOB flat Sitting - Scoot to [place] of Bed: 3: Mod assist;With rail Details for Bed Mobility Assistance: cues for initiation and for technique; physical assist to acheive completion of rolling fully onto side right and left, cues to flex hips with rolling Transfers Transfers: Lateral/Scoot Transfers;Stand Pivot Transfers Sit to Stand: 1: +2 Total  assist;With upper extremity assist;From bed Sit to Stand: Patient Percentage: 30% Stand to Sit: 1: +2 Total assist;With upper extremity assist;To chair/3-in-1 Stand to Sit: Patient Percentage: 20% Details for Transfer Assistance: Difficulty with total wt bearing through LLE. L knee did not achieve full extension    Shoulder Instructions     Exercises     PT Diagnosis: Difficulty walking;Generalized weakness;Acute pain  PT Problem List: Decreased strength;Decreased range of motion;Decreased activity tolerance;Decreased balance;Decreased mobility;Decreased coordination;Decreased knowledge of use of DME;Pain;Decreased knowledge of precautions PT Treatment Interventions: DME instruction;Gait training;Functional mobility training;Balance training;Therapeutic exercise;Therapeutic activities;Neuromuscular re-education;Patient/family education;Cognitive remediation;Wheelchair mobility training   PT Goals Acute Rehab PT Goals PT Goal Formulation: With patient Time For Goal Achievement: 12/27/11 Potential to Achieve Goals: Good Pt will go Supine/Side to Sit: with supervision PT Goal: Supine/Side to Sit - Progress: Goal set today Pt will go Sit to Supine/Side: with supervision PT Goal: Sit to Supine/Side - Progress: Goal set today Pt will go Sit to Stand: with supervision PT Goal: Sit to Stand - Progress: Goal set today Pt will go Stand to Sit: with supervision PT Goal: Stand to Sit - Progress: Goal set today Pt will Transfer Bed to Chair/Chair to Bed: with supervision PT Transfer Goal: Bed to Chair/Chair to Bed - Progress: Goal set today Pt will Ambulate: 1 - 15 feet;with min assist;with rolling walker PT Goal: Ambulate - Progress: Goal set today  Visit Information  Last PT Received On: 12/13/11 Assistance Needed: +2 (hopefull +1 soon; could be +1 for scoot pivot transfers) PT/OT Co-Evaluation/Treatment: Yes    Subjective Data  Subjective: "I may fall asleep"; Reports the transfer  was  quite hard Patient Stated Goal: to be able t go home   Prior Functioning  Home Living Lives With: Spouse Available Help at Discharge: Family;Personal care attendant;Available PRN/intermittently (PACE prgm) Type of Home: House Home Access: Level entry Home Layout: One level Bathroom Shower/Tub: Engineer, manufacturing systems: Standard Bathroom Accessibility: Yes How Accessible: Accessible via walker Home Adaptive Equipment: Bedside commode/3-in-1;Walker - rolling;Wheelchair - manual;Tub transfer bench Prior Function Level of Independence: Needs assistance Needs Assistance: Bathing;Dressing Bath: Minimal Dressing: Minimal Able to Take Stairs?: No Driving: No Vocation: On disability Comments: uses wheelchair for community access; used RW to walk in home prior to BKA; PACE program tues and thurs (they transport her in a [place]), HD MWF Communication Communication: No difficulties Dominant Hand: Right    Cognition  Overall Cognitive Status: Impaired Area of Impairment: Attention Arousal/Alertness: Awake/alert Orientation Level: Appears intact for tasks assessed Behavior During Session: [facility] for tasks performed Current Attention Level: Sustained Safety/Judgement: Decreased safety judgement for tasks assessed;Decreased awareness of need for assistance;Impulsive Safety/Judgement - Other Comments: cues for problem solving basic transfers, impulsive with transfers and needs cues for safety    Extremity/Trunk Assessment Right Upper Extremity Assessment RUE ROM/Strength/Tone: [facility] for tasks assessed RUE Sensation: Deficits;History of peripheral neuropathy RUE Coordination: Deficits;WFL - fine motor RUE Coordination Deficits: decreased in hand manipulation Left Upper Extremity Assessment LUE ROM/Strength/Tone: Deficits LUE ROM/Strength/Tone Deficits: prev CVA effecting LUE function LUE Sensation: Deficits;History of peripheral neuropathy LUE Coordination: Deficits LUE Coordination  Deficits: decreased gross and fine motor Right Lower Extremity Assessment RLE ROM/Strength/Tone: Deficits RLE ROM/Strength/Tone Deficits: s/p BKA; painful with motion; +quad activation, and able to achieve at or near full knee extension with multiple reps, and max cues (verbal and gentle tactile cueing) Left Lower Extremity Assessment LLE ROM/Strength/Tone: [facility] for tasks assessed (grossly ) LLE ROM/Strength/Tone Deficits: residual hemiparesis Trunk Assessment Trunk Assessment: Kyphotic   Balance Balance Balance Assessed: Yes Static Sitting Balance Static Sitting - Balance Support: Bilateral upper extremity supported;Feet supported Static Sitting - Level of Assistance: 5: Stand by assistance Static Sitting - Comment/# of Minutes: 10 Dynamic Sitting Balance Dynamic Sitting - Balance Support: Feet supported;Right upper extremity supported;Left upper extremity supported Dynamic Sitting - Level of Assistance: 4: Min assist Dynamic Sitting Balance - Compensations: decreased BOS and vision deficits altering sense of midline Static Standing Balance Static Standing - Balance Support: Bilateral upper extremity supported;During functional activity Standardized Balance Assessment Standardized Balance Assessment:  (functional tasks)  End of Session PT - End of Session Equipment Utilized During Treatment: Gait belt Activity Tolerance: Patient limited by pain;Patient limited by fatigue Patient left: in chair;with call bell/phone within reach (positioned for optimal R knee extension) Nurse Communication: Mobility status  GP     Van Clines [facility] [place], [place] [phone]  12/13/2011, 11:58 AM

## 2011-12-14 ENCOUNTER — Inpatient Hospital Stay (HOSPITAL_COMMUNITY): Payer: Medicare (Managed Care)

## 2011-12-14 ENCOUNTER — Inpatient Hospital Stay (HOSPITAL_COMMUNITY)
Admission: RE | Admit: 2011-12-14 | Payer: PRIVATE HEALTH INSURANCE | Source: Ambulatory Visit | Admitting: Orthopedic Surgery

## 2011-12-14 ENCOUNTER — Encounter (HOSPITAL_COMMUNITY): Admission: RE | Payer: Self-pay | Source: Ambulatory Visit

## 2011-12-14 LAB — CBC
HCT: 28.9 % — ABNORMAL LOW (ref 36.0–46.0)
Hemoglobin: 9.1 g/dL — ABNORMAL LOW (ref 12.0–15.0)
MCH: 25.4 pg — ABNORMAL LOW (ref 26.0–34.0)
MCHC: 31.5 g/dL (ref 30.0–36.0)
MCV: 80.7 fL (ref 78.0–100.0)
RDW: 16.5 % — ABNORMAL HIGH (ref 11.5–15.5)

## 2011-12-14 LAB — RENAL FUNCTION PANEL
Albumin: 2.5 g/dL — ABNORMAL LOW (ref 3.5–5.2)
BUN: 29 mg/dL — ABNORMAL HIGH (ref 6–23)
CO2: 27 mEq/L (ref 19–32)
Calcium: 9.5 mg/dL (ref 8.4–10.5)
Chloride: 93 mEq/L — ABNORMAL LOW (ref 96–112)
Creatinine, Ser: 5.98 mg/dL — ABNORMAL HIGH (ref 0.50–1.10)
GFR calc Af Amer: 8 mL/min — ABNORMAL LOW (ref >90)
GFR calc non Af Amer: 7 mL/min — ABNORMAL LOW (ref >90)
Glucose, Bld: 105 mg/dL — ABNORMAL HIGH (ref 70–99)
Phosphorus: 6.1 mg/dL — ABNORMAL HIGH (ref 2.3–4.6)
Potassium: 4.1 mEq/L (ref 3.5–5.1)
Sodium: 135 mEq/L (ref 135–145)

## 2011-12-14 SURGERY — AMPUTATION, FOOT, PARTIAL
Anesthesia: General | Site: Foot | Laterality: Right

## 2011-12-14 MED ORDER — TRAMADOL HCL 50 MG PO TABS
50.0000 mg | ORAL_TABLET | Freq: Four times a day (QID) | ORAL | Status: DC | PRN
  Filled 2011-12-14: qty 1

## 2011-12-14 MED ORDER — DARBEPOETIN ALFA-POLYSORBATE 25 MCG/0.42ML IJ SOLN
25.0000 ug | INTRAMUSCULAR | Status: DC
  Administered 2011-12-14: 25 ug via INTRAVENOUS

## 2011-12-14 MED ORDER — PENTAFLUOROPROP-TETRAFLUOROETH EX AERO
INHALATION_SPRAY | CUTANEOUS | Status: AC
  Administered 2011-12-14: 1
  Filled 2011-12-14: qty 103.5

## 2011-12-14 MED ORDER — HYDROMORPHONE HCL PF 1 MG/ML IJ SOLN
INTRAMUSCULAR | Status: AC
  Administered 2011-12-14: 1 mg via INTRAVENOUS
  Filled 2011-12-14: qty 1

## 2011-12-14 MED ORDER — DARBEPOETIN ALFA-POLYSORBATE 25 MCG/0.42ML IJ SOLN
INTRAMUSCULAR | Status: AC
  Filled 2011-12-14: qty 0.42

## 2011-12-14 MED ORDER — TRAMADOL HCL 50 MG PO TABS: 50.0000 mg | ORAL_TABLET | ORAL | Status: DC | PRN

## 2011-12-14 NOTE — Progress Notes (Signed)
[facility] Progress Note  Subjective:  Feels better today.  Leg ok as long as she can keep in a certain position. Poor appetite.  Objective Filed Vitals:   12/14/11 0800 12/14/11 0828 12/14/11 0857 12/14/11 0924  BP: 117/64 124/64 133/62 116/58  Pulse: 89 84 95 93  Temp:      TempSrc:      Resp:      Height:      Weight:      SpO2:       Physical Exam goal 2.7 General: NAD Heart: RRR Lungs:coarse BS, but no overt crackles or wheezes Abdomen: soft NT Extremities: right BKA wrapped; left LE compression stockings no edema  Dialysis Access: right upper AVGG Qb  Dialysis Orders: Center: [facility] on MWF Optiflux 180  EDW 78.5 HD Bath 2K 2.0 Ca Time3.5 Heparin 7000. Access right upper AVGG BFR 500 DFR 800  Zemplar 18 mcg IV/HD Epogen 1000 Units IV/HD Venofer none Other profile 4; ferritins consistently in the 2000s and tsats in the 20s   Assessment/Plan:  1. S/P right BKA- per Dr. Lajoyce Corners on 10/21. No heparin HD Wed 2. ESRD - MWF - usual orders written for Wed - re-eval EDW for d/c; K 4.1 3. Hypertension/volume - usually runs 100 - 120s on HD; on low dose norvasc at HS 4. Anemia - Hgb stable at 9.1. On Epo as outpt; will give 25 of Aranesp today. 5. Metabolic bone disease - Prone to hypercalcemia due to high dose zemplar - on 2 Ca bath; Corr Ca 10.7 continue sensipar and zemplar; iPTH 500s in September. P slightly elevated at 6.1.  Not eating much so not reflective of diet.  No changes. 6. Nutrition - changed to high protein renal diet; add supplements; Alb 2.5 down from 4.8 9/18. She has been gradually losing weight - liking due to chronic nonhealing wounds/infx. Needs to start ONSP after discharge. 7. VTE prophylaxis - coumadin started ? Duration       8.   Diabetes mellitus - Lantus + SSI; accuchecks ok    Sheffield Slider, PA-C [facility] Beeper [phone]  12/14/2011,9:52 AM  LOS: 2 days   Patient seen and examined and agree with assessment and plan  as above.  Vinson Moselle  MD [facility] [phone] pgr    [phone] cell 12/14/2011, 10:34 AM   Additional Objective Labs: Basic Metabolic Panel:  Lab 12/14/11 4696 12/13/11 1227 12/12/11 1414  NA 135 136 138  K 4.1 3.7 3.4*  CL 93* 93* 93*  CO2 27 29 30   GLUCOSE 105* 145* 167*  BUN 29* 23 15  CREATININE 5.98* 4.62* 3.09*  CALCIUM 9.5 9.7 9.5  ALB -- -- --  PHOS 6.1* -- --   Liver Function Tests:  Lab 12/14/11 0659 12/12/11 1414  AST -- 30  ALT -- 18  ALKPHOS -- 111  BILITOT -- 1.8*  PROT -- 8.0  ALBUMIN 2.5* 3.0*   CBC:  Lab 12/14/11 0739 12/13/11 1227 12/12/11 1414  WBC 11.6* 7.9 14.0*  NEUTROABS -- -- --  HGB 9.1* 9.3* 10.8*  HCT 28.9* 29.4* 33.2*  MCV 80.7 81.0 79.6  PLT 325 303 344  CBG:  Lab 12/13/11 2034 12/13/11 1647 12/13/11 1204 12/13/11 0731 12/12/11 2128  GLUCAP 105* 117* 148* 81 119*  Medications:    . sodium chloride Stopped (12/13/11 0300)      . amLODipine  5 mg Oral QHS  . aspirin EC  81 mg Oral Daily  .  cinacalcet  30 mg Oral Daily  . cinacalcet  90 mg Oral QPM  . ezetimibe  10 mg Oral Daily  . fenofibrate  54 mg Oral Daily  . gabapentin  400 mg Oral QHS  . insulin aspart  0-9 Units Subcutaneous TID WC  . insulin aspart  3 Units Subcutaneous TID WC  . insulin glargine  6 Units Subcutaneous QHS  . multivitamin  1 tablet Oral QHS  . pantoprazole  40 mg Oral Q1200  . pentafluoroprop-tetrafluoroeth      . sevelamer  2,400 mg Oral TID WC  . warfarin  5 mg Oral ONCE-1800  . warfarin   Does not apply Once  . Warfarin - Pharmacist Dosing Inpatient   Does not apply q1800  . DISCONTD: sevelamer  800 mg Oral TID WC

## 2011-12-14 NOTE — Progress Notes (Signed)
ANTICOAGULATION CONSULT NOTE - Follow Up Consult  Pharmacy Consult for Coumadin Indication: VTE prophylaxis  Allergies  Allergen Reactions  . Metoclopramide Hcl Other (See Comments)    Tardive dyskinesia  . Codeine Nausea And Vomiting  . Lipitor (Atorvastatin Calcium) Other (See Comments)    Muscle weakness    Patient Measurements: Height: 5' 4.5" (163.8 cm) Weight: 161 lb 13.1 oz (73.4 kg) IBW/kg (Calculated) : 55.85  Heparin Dosing Weight:   Vital Signs: Temp: 98.7 F (37.1 C) (10/23 1011) Temp src: Oral (10/23 1011) BP: 131/65 mmHg (10/23 1011) Pulse Rate: 99  (10/23 1011)  Labs:  Alvira Philips 12/14/11 0739 12/14/11 0659 12/14/11 0658 12/13/11 1227 12/13/11 0620 12/12/11 1414  HGB 9.1* -- -- 9.3* -- --  HCT 28.9* -- -- 29.4* -- 33.2*  PLT 325 -- -- 303 -- 344  APTT -- -- -- -- -- 51*  LABPROT -- -- 26.8* -- 18.3* 17.2*  INR -- -- 2.63* -- 1.57* 1.44  HEPARINUNFRC -- -- -- -- -- --  CREATININE -- 5.98* -- 4.62* -- 3.09*  CKTOTAL -- -- -- -- -- --  CKMB -- -- -- -- -- --  TROPONINI -- -- -- -- -- --    Estimated Creatinine Clearance: 10.3 ml/min (by C-G formula based on Cr of 5.98).  Assessment: 56yof on Coumadin for VTE prophylaxis s/p BKA. INR (2.63) is therapeutic after increasing significantly with only Coumadin 5mg  x 2. With that large of increase, expect INR to continue trending up - will hold Coumadin tonight and follow-up AM INR. -  H/H trending down, Plts stable - No significant bleeding reported - LFTs wnl  Goal of Therapy:  INR 2-3   Plan:  1. No Coumadin today 2. Follow-up AM INR  Cleon Dew [phone] 12/14/2011,11:09 AM

## 2011-12-14 NOTE — Progress Notes (Addendum)
Occupational Therapy Treatment Patient Details Name: Faith [name] MRN: [id number] DOB: [birth year] Today's Date: 12/14/2011 Time: 8469-6295 OT Time Calculation (min): 38 min  OT Assessment / Plan / Recommendation Comments on Treatment Session Pt with increased lethargy today. Pt's husband states that this is unusual for her. Pt confused at times with difficulty motor planning and perseverating motorically and verbally. Ataxic type movemnts noted with RUE. Pt reports that her arm feels different. Nsg notified. ? Affect of medicine or neurological. Face symmetrical.PLEASE ORDER R KI TO KEEP R KNEE IN EXTENSION. Pt positions in knee flexion for comfort.    Follow Up Recommendations  Inpatient Rehab    Barriers to Discharge       Equipment Recommendations  None recommended by OT    Recommendations for Other Services Rehab consult  Frequency Min 3X/week   Plan Discharge plan remains appropriate    Precautions / Restrictions Precautions Precautions: Fall Precaution Comments: s/p CVA  with L hemiparesis Restrictions RLE Weight Bearing: Non weight bearing   Pertinent Vitals/Pain O2 sats 88 RA. 94 2L. HR 84. No c/o pain unless RLE moved    ADL  Eating/Feeding: Performed;Minimal assistance;Other (comment) (difficulty holding cup) Grooming: Wash/dry face;Other (comment) (mod vc to maintain attention to task) Where Assessed - Grooming: Supported sitting Transfers/Ambulation Related to ADLs: total A transitional movements ADL Comments: Pt extremely lethargic today. Able to increase to alert state, but at appeared confused, had difficulty following command and reported feeling differently. Educated husband on proper positioning of RLE to prevent knee flexor contracture. Educated pt/;husband on desensitization techniques    OT Diagnosis:    OT Problem List:   OT Treatment Interventions:     OT Goals Acute Rehab OT Goals OT Goal Formulation: With patient Time For Goal Achievement:  12/27/11 Potential to Achieve Goals: Good ADL Goals Pt Will Perform Grooming: with set-up;Sitting, chair ADL Goal: Grooming - Progress: Progressing toward goals Pt Will Perform Upper Body Bathing: with set-up;Sitting, chair;Supported ADL Goal: Product manager - Progress: Progressing toward goals Pt Will Perform Lower Body Bathing: with mod assist;Sitting, chair;Unsupported;with adaptive equipment;with cueing (comment type and amount) ADL Goal: Lower Body Bathing - Progress: Progressing toward goals Pt Will Transfer to Toilet: with mod assist;with DME;Drop arm 3-in-1;Maintaining weight bearing status;with cueing (comment type and amount) ADL Goal: Toilet Transfer - Progress: Progressing toward goals Pt Will Perform Toileting - Clothing Manipulation: with mod assist;Sitting on 3-in-1 or toilet;with cueing (comment type and amount) ADL Goal: Toileting - Clothing Manipulation - Progress: Progressing toward goals Pt Will Perform Toileting - Hygiene: with supervision;Sitting on 3-in-1 or toilet;Leaning right and/or left on 3-in-1/toilet ADL Goal: Toileting - Hygiene - Progress: Progressing toward goals Miscellaneous OT Goals Miscellaneous OT Goal #1: Pt will verbalize to staff proper positioning of RLE to prevent knee flexxion contracture OT Goal: Miscellaneous Goal #1 - Progress: Progressing toward goals Miscellaneous OT Goal #2: Pt will demonstrate desensitization R residual limb with min vc to reduce pain and increase independence with ADL and mobility. OT Goal: Miscellaneous Goal #2 - Progress: Progressing toward goals  Visit Information  Last OT Received On: 12/14/11 Assistance Needed: +2    Subjective Data      Prior Functioning       Cognition  Overall Cognitive Status: Impaired Area of Impairment: Attention Arousal/Alertness: Lethargic Orientation Level: Appears intact for tasks assessed Behavior During Session: Lethargic Current Attention Level: Focused Attention - Other  Comments: internally distracted Safety/Judgement: Decreased awareness of safety precautions;Decreased safety judgement for tasks assessed;Decreased awareness  of need for assistance Safety/Judgement - Other Comments: mod vc functional basic tasks Cognition - Other Comments: Demonstrating perseveration today motorically and verbally, ataxic type movements.     Mobility  Shoulder Instructions Bed Mobility Bed Mobility: Supine to Sit;Sitting - Scoot to Edge of Bed Supine to Sit: 3: Mod assist;With rails Sitting - Scoot to Edge of Bed: 3: Mod assist;With rail Details for Bed Mobility Assistance: cues for initiation and technique, and also to follow through with task to completion; Noted pt does a good job elevating shoulders off of bed, going through elbow prop to hand-prop; Needing steady assist once up to sitting; Seems to have trouble organizing weight shifting to unweigh hips for scooting Transfers Transfers:  (focus on dynamic sitting balance and trunkal control) Details for Transfer Assistance: Cues for initiation and technique; Difficulty organizing trunk over base of suppot (left foot and UEs) for lateral scoots -- required assist using bed pad; Required knee blocking for safe squat pivot transfer       Exercises  General Exercises - Upper Extremity Shoulder Flexion: Strengthening;Both;10 reps;Seated Chair Push Up: Strengthening;Both;5 reps;Other (comment) (max A)   Balance Static Sitting Balance Static Sitting - Balance Support: No upper extremity supported;Feet supported Static Sitting - Level of Assistance: 4: Min assist Static Sitting - Comment/# of Minutes: 3 min  Dynamic Sitting Balance Dynamic Sitting - Comments: Fear of falling. Poorsense of midline   End of Session OT - End of Session Equipment Utilized During Treatment: Gait belt Activity Tolerance: Patient limited by fatigue;Other (comment) (pt lethargic with noted cognitive changes) Patient left: in chair;with call  bell/phone within reach;with family/visitor present Nurse Communication: Mobility status;Other (comment) (concerns over cognitive changes and appaent ataxia)  GO     Donato Studley,HILLARY 12/14/2011, 5:18 PM [facility], OTR/L  [phone] 12/14/2011

## 2011-12-14 NOTE — Progress Notes (Signed)
Patient ID: Faith [name], female   DOB: 1955/08/29, 56 y.o.   MRN: [id number] Postoperative day 2 transtibial amputation. Patient in dialysis this morning. Order has been placed for inpatient rehabilitation.

## 2011-12-14 NOTE — Procedures (Signed)
I was present at this dialysis session. I have reviewed the session itself and made appropriate changes.   Vinson Moselle, MD [employer] 12/14/2011, 10:32 AM

## 2011-12-14 NOTE — Progress Notes (Signed)
Physical Therapy Treatment Patient Details Name: Faith [name] MRN: [id number] DOB: [birth year] Today's Date: 12/14/2011 Time: 1410-1445 PT Time Calculation (min): 35 min  PT Assessment / Plan / Recommendation Comments on Treatment Session  S/p R BKA; Very fatigued even initially during session as pt had spent most of morning in HD; Still able to participate, and seemed comfortabl e once OOB; Positioned for maximum R knee extension in chair; Pt required reinforcement of the need to maximize R knee extension in order to eventually prosthetic train    Follow Up Recommendations  Post acute inpatient     Does the patient have the potential to tolerate intense rehabilitation  Yes, Recommend IP Rehab Screening  Barriers to Discharge        Equipment Recommendations  None recommended by PT    Recommendations for Other Services Rehab consult  Frequency Min 3X/week   Plan Discharge plan remains appropriate    Precautions / Restrictions Precautions Precautions: Fall Precaution Comments: s/p CVA  with L hemiparesis Restrictions RLE Weight Bearing: Non weight bearing   Pertinent Vitals/Pain Grimace with motion; No severity given    Mobility  Bed Mobility Bed Mobility: Supine to Sit;Sitting - Scoot to Edge of Bed Supine to Sit: 3: Mod assist;With rails Sitting - Scoot to Edge of Bed: 3: Mod assist;With rail Details for Bed Mobility Assistance: cues for initiation and technique, and also to follow through with task to completion; Noted pt does a good job elevating shoulders off of bed, going through elbow prop to hand-prop; Needing steady assist once up to sitting; Seems to have trouble organizing weight shifting to unweigh hips for scooting Transfers Transfers: Lateral/Scoot Electrical engineer Transfers: 1: +2 Total assist Squat Pivot Transfers: Patient Percentage: 50% Lateral/Scoot Transfers: 3: Mod assist (simulated lateral scoot transfer with scooting at  EOB) Details for Transfer Assistance: Cues for initiation and technique; Difficulty organizing trunk over base of suppot (left foot and UEs) for lateral scoots -- required assist using bed pad; Required knee blocking for safe squat pivot transfer    Exercises     PT Diagnosis:    PT Problem List:   PT Treatment Interventions:     PT Goals Acute Rehab PT Goals Time For Goal Achievement: 12/27/11 Potential to Achieve Goals: Good Pt will go Supine/Side to Sit: with supervision PT Goal: Supine/Side to Sit - Progress: Progressing toward goal Pt will Transfer Bed to Chair/Chair to Bed: with supervision PT Transfer Goal: Bed to Chair/Chair to Bed - Progress: Progressing toward goal  Visit Information  Last PT Received On: 12/14/11 Assistance Needed: +2    Subjective Data  Subjective: Agreeable to OOB, even though quite tired post HD Patient Stated Goal: to be able t go home   Cognition  Overall Cognitive Status: Impaired Area of Impairment: Attention Arousal/Alertness: Lethargic (but arousable) Orientation Level: Appears intact for tasks assessed Behavior During Session: [facility] for tasks performed Current Attention Level: Sustained Attention - Other Comments: Quite tired this afternoon post HD; tending to fall asleep, noted had trouble completing the task of putting her lip balm on lips, despite reminder cues    Balance  Static Sitting Balance Static Sitting - Balance Support: Bilateral upper extremity supported;Feet supported Static Sitting - Level of Assistance: 4: Min assist Static Sitting - Comment/# of Minutes: 5 minutes EOB with noted incr sway  End of Session PT - End of Session Equipment Utilized During Treatment: Gait belt Activity Tolerance: Patient limited by fatigue Patient left: in chair;with  call bell/phone within reach Nurse Communication: Mobility status   GP     Van Clines [facility] [place], [place] [phone]  12/14/2011, 3:59 PM

## 2011-12-14 NOTE — Progress Notes (Signed)
Patient's PT & OT recommending CIR for pt. Called Dr Audrie Lia office requesting an order for inpatient rehab consult. Call for questions: [phone].

## 2011-12-14 NOTE — Progress Notes (Signed)
OT Cancellation Note  Patient Details Name: Faith [name] MRN: [id number] DOB: 03-12-1955   Cancelled Treatment:     Pt in dialysis this am. Will attempt this pm.  [facility] [name], OTR/L  [phone] 12/14/2011 12/14/2011, 9:19 AM

## 2011-12-15 ENCOUNTER — Inpatient Hospital Stay (HOSPITAL_COMMUNITY)
Admission: RE | Admit: 2011-12-15 | Discharge: 2012-01-04 | DRG: 945 | Disposition: A | Discharge status: Home Health | Payer: Medicare (Managed Care) | Source: Ambulatory Visit | Attending: Physical Medicine & Rehabilitation | Admitting: Physical Medicine & Rehabilitation

## 2011-12-15 ENCOUNTER — Inpatient Hospital Stay (HOSPITAL_COMMUNITY): Payer: Medicare (Managed Care)

## 2011-12-15 ENCOUNTER — Ambulatory Visit (INDEPENDENT_AMBULATORY_CARE_PROVIDER_SITE_OTHER): Payer: No Typology Code available for payment source | Admitting: Ophthalmology

## 2011-12-15 DIAGNOSIS — E1129 Type 2 diabetes mellitus with other diabetic kidney complication: Secondary | ICD-10-CM | POA: Diagnosis not present

## 2011-12-15 DIAGNOSIS — I120 Hypertensive chronic kidney disease with stage 5 chronic kidney disease or end stage renal disease: Secondary | ICD-10-CM

## 2011-12-15 DIAGNOSIS — E1139 Type 2 diabetes mellitus with other diabetic ophthalmic complication: Secondary | ICD-10-CM | POA: Diagnosis not present

## 2011-12-15 DIAGNOSIS — I69959 Hemiplegia and hemiparesis following unspecified cerebrovascular disease affecting unspecified side: Secondary | ICD-10-CM

## 2011-12-15 DIAGNOSIS — I2510 Atherosclerotic heart disease of native coronary artery without angina pectoris: Secondary | ICD-10-CM

## 2011-12-15 DIAGNOSIS — L97809 Non-pressure chronic ulcer of other part of unspecified lower leg with unspecified severity: Secondary | ICD-10-CM | POA: Diagnosis not present

## 2011-12-15 DIAGNOSIS — E1169 Type 2 diabetes mellitus with other specified complication: Secondary | ICD-10-CM | POA: Diagnosis not present

## 2011-12-15 DIAGNOSIS — M869 Osteomyelitis, unspecified: Secondary | ICD-10-CM

## 2011-12-15 DIAGNOSIS — E11319 Type 2 diabetes mellitus with unspecified diabetic retinopathy without macular edema: Secondary | ICD-10-CM

## 2011-12-15 DIAGNOSIS — N039 Chronic nephritic syndrome with unspecified morphologic changes: Secondary | ICD-10-CM

## 2011-12-15 DIAGNOSIS — E785 Hyperlipidemia, unspecified: Secondary | ICD-10-CM | POA: Diagnosis not present

## 2011-12-15 DIAGNOSIS — Z992 Dependence on renal dialysis: Secondary | ICD-10-CM

## 2011-12-15 DIAGNOSIS — I70269 Atherosclerosis of native arteries of extremities with gangrene, unspecified extremity: Secondary | ICD-10-CM

## 2011-12-15 DIAGNOSIS — D684 Acquired coagulation factor deficiency: Secondary | ICD-10-CM | POA: Diagnosis not present

## 2011-12-15 DIAGNOSIS — M9080 Osteopathy in diseases classified elsewhere, unspecified site: Secondary | ICD-10-CM

## 2011-12-15 DIAGNOSIS — K3184 Gastroparesis: Secondary | ICD-10-CM | POA: Diagnosis not present

## 2011-12-15 DIAGNOSIS — Z9861 Coronary angioplasty status: Secondary | ICD-10-CM | POA: Diagnosis not present

## 2011-12-15 DIAGNOSIS — E1159 Type 2 diabetes mellitus with other circulatory complications: Secondary | ICD-10-CM | POA: Diagnosis not present

## 2011-12-15 DIAGNOSIS — Z5189 Encounter for other specified aftercare: Principal | ICD-10-CM

## 2011-12-15 DIAGNOSIS — L02619 Cutaneous abscess of unspecified foot: Secondary | ICD-10-CM

## 2011-12-15 DIAGNOSIS — K219 Gastro-esophageal reflux disease without esophagitis: Secondary | ICD-10-CM | POA: Diagnosis not present

## 2011-12-15 DIAGNOSIS — N186 End stage renal disease: Secondary | ICD-10-CM | POA: Diagnosis not present

## 2011-12-15 DIAGNOSIS — S88119A Complete traumatic amputation at level between knee and ankle, unspecified lower leg, initial encounter: Secondary | ICD-10-CM

## 2011-12-15 DIAGNOSIS — Z888 Allergy status to other drugs, medicaments and biological substances status: Secondary | ICD-10-CM | POA: Diagnosis not present

## 2011-12-15 DIAGNOSIS — S98139A Complete traumatic amputation of one unspecified lesser toe, initial encounter: Secondary | ICD-10-CM | POA: Diagnosis not present

## 2011-12-15 DIAGNOSIS — N058 Unspecified nephritic syndrome with other morphologic changes: Secondary | ICD-10-CM

## 2011-12-15 DIAGNOSIS — E1149 Type 2 diabetes mellitus with other diabetic neurological complication: Secondary | ICD-10-CM

## 2011-12-15 DIAGNOSIS — Z8249 Family history of ischemic heart disease and other diseases of the circulatory system: Secondary | ICD-10-CM

## 2011-12-15 DIAGNOSIS — Z833 Family history of diabetes mellitus: Secondary | ICD-10-CM

## 2011-12-15 DIAGNOSIS — E119 Type 2 diabetes mellitus without complications: Secondary | ICD-10-CM | POA: Diagnosis present

## 2011-12-15 DIAGNOSIS — E1142 Type 2 diabetes mellitus with diabetic polyneuropathy: Secondary | ICD-10-CM | POA: Diagnosis not present

## 2011-12-15 DIAGNOSIS — Z86711 Personal history of pulmonary embolism: Secondary | ICD-10-CM | POA: Diagnosis not present

## 2011-12-15 DIAGNOSIS — G547 Phantom limb syndrome without pain: Secondary | ICD-10-CM

## 2011-12-15 DIAGNOSIS — I252 Old myocardial infarction: Secondary | ICD-10-CM | POA: Diagnosis not present

## 2011-12-15 DIAGNOSIS — L03119 Cellulitis of unspecified part of limb: Secondary | ICD-10-CM

## 2011-12-15 DIAGNOSIS — N2581 Secondary hyperparathyroidism of renal origin: Secondary | ICD-10-CM

## 2011-12-15 DIAGNOSIS — I639 Cerebral infarction, unspecified: Secondary | ICD-10-CM | POA: Diagnosis present

## 2011-12-15 DIAGNOSIS — D631 Anemia in chronic kidney disease: Secondary | ICD-10-CM

## 2011-12-15 HISTORY — DX: Dependence on renal dialysis: Z99.2

## 2011-12-15 HISTORY — DX: Dependence on renal dialysis: N18.6

## 2011-12-15 LAB — GLUCOSE, CAPILLARY
Glucose-Capillary: 155 mg/dL — ABNORMAL HIGH (ref 70–99)
Glucose-Capillary: 282 mg/dL — ABNORMAL HIGH (ref 70–99)
Glucose-Capillary: 130 mg/dL — ABNORMAL HIGH (ref 70–99)
Glucose-Capillary: 85 mg/dL (ref 70–99)

## 2011-12-15 LAB — PROTIME-INR: Prothrombin Time: 37.4 seconds — ABNORMAL HIGH (ref 11.6–15.2)

## 2011-12-15 MED ORDER — DARBEPOETIN ALFA-POLYSORBATE 25 MCG/0.42ML IJ SOLN
25.0000 ug | INTRAMUSCULAR | Status: DC
  Administered 2011-12-21 – 2011-12-28 (×2): 25 ug via INTRAVENOUS
  Filled 2011-12-15 (×2): qty 0.42

## 2011-12-15 MED ORDER — PENTAFLUOROPROP-TETRAFLUOROETH EX AERO: 1.0000 "application " | INHALATION_SPRAY | CUTANEOUS | Status: DC | PRN

## 2011-12-15 MED ORDER — ACETAMINOPHEN 325 MG PO TABS
325.0000 mg | ORAL_TABLET | ORAL | Status: DC | PRN
  Administered 2011-12-17 – 2011-12-28 (×9): 650 mg via ORAL
  Filled 2011-12-15 (×7): qty 2

## 2011-12-15 MED ORDER — CINACALCET HCL 30 MG PO TABS
30.0000 mg | ORAL_TABLET | Freq: Every day | ORAL | Status: DC
  Administered 2011-12-16 – 2012-01-02 (×18): 30 mg via ORAL
  Filled 2011-12-15 (×19): qty 1

## 2011-12-15 MED ORDER — PANTOPRAZOLE SODIUM 40 MG PO TBEC
40.0000 mg | DELAYED_RELEASE_TABLET | Freq: Every day | ORAL | Status: DC
  Administered 2011-12-16 – 2012-01-04 (×20): 40 mg via ORAL
  Filled 2011-12-15 (×19): qty 1

## 2011-12-15 MED ORDER — POLYETHYLENE GLYCOL 3350 17 G PO PACK
17.0000 g | PACK | Freq: Every day | ORAL | Status: DC
  Administered 2011-12-15 – 2011-12-19 (×5): 17 g via ORAL
  Filled 2011-12-15 (×8): qty 1

## 2011-12-15 MED ORDER — RENA-VITE PO TABS
1.0000 | ORAL_TABLET | Freq: Every day | ORAL | Status: DC
  Administered 2011-12-15 – 2012-01-03 (×20): 1 via ORAL
  Filled 2011-12-15 (×21): qty 1

## 2011-12-15 MED ORDER — METHOCARBAMOL 500 MG PO TABS
500.0000 mg | ORAL_TABLET | Freq: Four times a day (QID) | ORAL | Status: DC | PRN
  Administered 2011-12-20 – 2012-01-03 (×14): 500 mg via ORAL
  Filled 2011-12-15 (×16): qty 1

## 2011-12-15 MED ORDER — DOCUSATE SODIUM 283 MG RE ENEM
1.0000 | ENEMA | RECTAL | Status: DC | PRN
  Administered 2011-12-31: 283 mg via RECTAL
  Filled 2011-12-15 (×3): qty 1

## 2011-12-15 MED ORDER — HYDROCODONE-ACETAMINOPHEN 5-325 MG PO TABS: 1.0000 | ORAL_TABLET | ORAL | Status: DC | PRN

## 2011-12-15 MED ORDER — LIDOCAINE-PRILOCAINE 2.5-2.5 % EX CREA
1.0000 "application " | TOPICAL_CREAM | CUTANEOUS | Status: DC | PRN
  Filled 2011-12-15: qty 5

## 2011-12-15 MED ORDER — ONDANSETRON HCL 4 MG/2ML IJ SOLN: 4.0000 mg | Freq: Four times a day (QID) | INTRAMUSCULAR | Status: DC | PRN

## 2011-12-15 MED ORDER — GLYCERIN (LAXATIVE) 2.1 G RE SUPP
1.0000 | RECTAL | Status: DC | PRN
  Filled 2011-12-15 (×2): qty 1

## 2011-12-15 MED ORDER — AMLODIPINE BESYLATE 5 MG PO TABS
5.0000 mg | ORAL_TABLET | Freq: Every day | ORAL | Status: DC
  Administered 2011-12-15 – 2011-12-16 (×2): 5 mg via ORAL
  Filled 2011-12-15 (×3): qty 1

## 2011-12-15 MED ORDER — NAPHAZOLINE HCL 0.1 % OP SOLN
1.0000 [drp] | Freq: Four times a day (QID) | OPHTHALMIC | Status: DC | PRN
  Filled 2011-12-15: qty 15

## 2011-12-15 MED ORDER — GUAIFENESIN-DM 100-10 MG/5ML PO SYRP: 5.0000 mL | ORAL_SOLUTION | Freq: Four times a day (QID) | ORAL | Status: DC | PRN

## 2011-12-15 MED ORDER — SORBITOL 70 % SOLN
30.0000 mL | Freq: Two times a day (BID) | Status: DC | PRN
  Administered 2011-12-20: 30 mL via ORAL
  Filled 2011-12-15: qty 30

## 2011-12-15 MED ORDER — INSULIN ASPART 100 UNIT/ML ~~LOC~~ SOLN
0.0000 [IU] | Freq: Three times a day (TID) | SUBCUTANEOUS | Status: DC
  Administered 2011-12-15 – 2011-12-22 (×10): 1 [IU] via SUBCUTANEOUS
  Administered 2011-12-24: 2 [IU] via SUBCUTANEOUS
  Administered 2011-12-25 – 2011-12-29 (×4): 1 [IU] via SUBCUTANEOUS
  Administered 2011-12-31: 14:00:00 via SUBCUTANEOUS

## 2011-12-15 MED ORDER — POLYVINYL ALCOHOL 1.4 % OP SOLN
1.0000 [drp] | OPHTHALMIC | Status: DC | PRN
  Filled 2011-12-15: qty 15

## 2011-12-15 MED ORDER — POLYETHYLENE GLYCOL 3350 17 G PO PACK
17.0000 g | PACK | Freq: Every day | ORAL | Status: DC | PRN
  Filled 2011-12-15: qty 1

## 2011-12-15 MED ORDER — SORBITOL 70 % SOLN: 30.0000 mL | Status: DC | PRN

## 2011-12-15 MED ORDER — OXYCODONE-ACETAMINOPHEN 5-325 MG PO TABS: 1.0000 | ORAL_TABLET | ORAL | Status: DC | PRN

## 2011-12-15 MED ORDER — ALBUTEROL SULFATE HFA 108 (90 BASE) MCG/ACT IN AERS
2.0000 | INHALATION_SPRAY | RESPIRATORY_TRACT | Status: DC | PRN
  Filled 2011-12-15: qty 6.7

## 2011-12-15 MED ORDER — FENOFIBRATE 54 MG PO TABS
54.0000 mg | ORAL_TABLET | Freq: Every day | ORAL | Status: DC
  Administered 2011-12-16 – 2012-01-04 (×20): 54 mg via ORAL
  Filled 2011-12-15 (×25): qty 1

## 2011-12-15 MED ORDER — CAMPHOR-MENTHOL 0.5-0.5 % EX LOTN
1.0000 "application " | TOPICAL_LOTION | Freq: Three times a day (TID) | CUTANEOUS | Status: DC | PRN
  Filled 2011-12-15: qty 222

## 2011-12-15 MED ORDER — SEVELAMER CARBONATE 800 MG PO TABS
2400.0000 mg | ORAL_TABLET | Freq: Three times a day (TID) | ORAL | Status: DC
  Administered 2011-12-15 – 2012-01-04 (×57): 2400 mg via ORAL
  Filled 2011-12-15 (×64): qty 3

## 2011-12-15 MED ORDER — NITROGLYCERIN 0.4 MG SL SUBL: 0.4000 mg | SUBLINGUAL_TABLET | SUBLINGUAL | Status: DC | PRN

## 2011-12-15 MED ORDER — TRAZODONE HCL 50 MG PO TABS: 25.0000 mg | ORAL_TABLET | Freq: Every evening | ORAL | Status: DC | PRN

## 2011-12-15 MED ORDER — CALCIUM CARBONATE 1250 MG/5ML PO SUSP
500.0000 mg | Freq: Four times a day (QID) | ORAL | Status: DC | PRN
  Filled 2011-12-15: qty 5

## 2011-12-15 MED ORDER — WARFARIN - PHARMACIST DOSING INPATIENT
Freq: Every day | Status: DC
  Administered 2011-12-24: 18:00:00

## 2011-12-15 MED ORDER — INSULIN GLARGINE 100 UNIT/ML ~~LOC~~ SOLN
6.0000 [IU] | Freq: Every day | SUBCUTANEOUS | Status: DC
  Administered 2011-12-15 – 2011-12-28 (×10): 6 [IU] via SUBCUTANEOUS

## 2011-12-15 MED ORDER — HYDROXYZINE HCL 25 MG PO TABS
25.0000 mg | ORAL_TABLET | Freq: Three times a day (TID) | ORAL | Status: DC | PRN
  Filled 2011-12-15: qty 1

## 2011-12-15 MED ORDER — INSULIN ASPART 100 UNIT/ML ~~LOC~~ SOLN
3.0000 [IU] | Freq: Three times a day (TID) | SUBCUTANEOUS | Status: DC
  Administered 2011-12-15 – 2011-12-31 (×7): 3 [IU] via SUBCUTANEOUS

## 2011-12-15 MED ORDER — LIDOCAINE HCL (PF) 1 % IJ SOLN
5.0000 mL | INTRAMUSCULAR | Status: DC | PRN
  Filled 2011-12-15: qty 5

## 2011-12-15 MED ORDER — NEPRO/CARBSTEADY PO LIQD: 237.0000 mL | ORAL | Status: DC | PRN

## 2011-12-15 MED ORDER — TRAMADOL HCL 50 MG PO TABS
50.0000 mg | ORAL_TABLET | Freq: Four times a day (QID) | ORAL | Status: DC | PRN
  Administered 2011-12-16 – 2011-12-21 (×11): 50 mg via ORAL
  Filled 2011-12-15 (×11): qty 1

## 2011-12-15 MED ORDER — GABAPENTIN 400 MG PO CAPS
400.0000 mg | ORAL_CAPSULE | Freq: Every day | ORAL | Status: DC
  Administered 2011-12-15 – 2011-12-17 (×3): 400 mg via ORAL
  Filled 2011-12-15 (×4): qty 1

## 2011-12-15 MED ORDER — ASPIRIN EC 81 MG PO TBEC
81.0000 mg | DELAYED_RELEASE_TABLET | Freq: Every day | ORAL | Status: DC
  Administered 2011-12-16 – 2012-01-04 (×20): 81 mg via ORAL
  Filled 2011-12-15 (×25): qty 1

## 2011-12-15 MED ORDER — ONDANSETRON HCL 4 MG PO TABS
4.0000 mg | ORAL_TABLET | Freq: Four times a day (QID) | ORAL | Status: DC | PRN
  Filled 2011-12-15 (×2): qty 1

## 2011-12-15 MED ORDER — EZETIMIBE 10 MG PO TABS
10.0000 mg | ORAL_TABLET | Freq: Every day | ORAL | Status: DC
  Administered 2011-12-16 – 2012-01-04 (×20): 10 mg via ORAL
  Filled 2011-12-15 (×25): qty 1

## 2011-12-15 MED ORDER — CINACALCET HCL 30 MG PO TABS
90.0000 mg | ORAL_TABLET | Freq: Every evening | ORAL | Status: DC
  Administered 2011-12-15 – 2012-01-01 (×16): 90 mg via ORAL
  Filled 2011-12-15 (×19): qty 3

## 2011-12-15 NOTE — Progress Notes (Signed)
Subjective:  Going to Rehab today, tolerated hd yesterday Objective Vital signs in last 24 hours: Filed Vitals:   12/14/11 1827 12/14/11 2050 12/15/11 0224 12/15/11 0607  BP: 112/64 86/40 121/52 123/59  Pulse:  79 88 100  Temp: 98.2 F (36.8 C) 100.7 F (38.2 C) 99.6 F (37.6 C) 99.7 F (37.6 C)  TempSrc: Oral Oral Oral Oral  Resp: 16 16 18 16   Height:      Weight:  72.3 kg (159 lb 6.3 oz)    SpO2: 96% 100% 99% 100%   Weight change: -2.6 kg (-5 lb 11.7 oz)  Intake/Output Summary (Last 24 hours) at 12/15/11 1041 Last data filed at 12/14/11 1900  Gross per 24 hour  Intake    120 ml  Output      0 ml  Net    120 ml   Labs: Basic Metabolic Panel:  Lab 12/14/11 1610 12/13/11 1227 12/12/11 1414  NA 135 136 138  K 4.1 3.7 3.4*  CL 93* 93* 93*  CO2 27 29 30   GLUCOSE 105* 145* 167*  BUN 29* 23 15  CREATININE 5.98* 4.62* 3.09*  CALCIUM 9.5 9.7 9.5  ALB -- -- --  PHOS 6.1* -- --   Liver Function Tests:  Lab 12/14/11 0659 12/12/11 1414  AST -- 30  ALT -- 18  ALKPHOS -- 111  BILITOT -- 1.8*  PROT -- 8.0  ALBUMIN 2.5* 3.0*   No results found for this basename: LIPASE:3,AMYLASE:3 in the last 168 hours No results found for this basename: AMMONIA:3 in the last 168 hours CBC:  Lab 12/14/11 0739 12/13/11 1227 12/12/11 1414  WBC 11.6* 7.9 14.0*  NEUTROABS -- -- --  HGB 9.1* 9.3* 10.8*  HCT 28.9* 29.4* 33.2*  MCV 80.7 81.0 79.6  PLT 325 303 344   Cardiac Enzymes: No results found for this basename: CKTOTAL:5,CKMB:5,CKMBINDEX:5,TROPONINI:5 in the last 168 hours CBG:  Lab 12/15/11 0753 12/14/11 2055 12/14/11 1712 12/14/11 1113 12/13/11 2034  GLUCAP 282* 177* 155* 140* 105*    Iron Studies: No results found for this basename: IRON,TIBC,TRANSFERRIN,FERRITIN in the last 72 hours Studies/Results: No results found. Medications:    . sodium chloride Stopped (12/13/11 0300)      . amLODipine  5 mg Oral QHS  . aspirin EC  81 mg Oral Daily  . cinacalcet  30 mg Oral  Daily  . cinacalcet  90 mg Oral QPM  . darbepoetin      . darbepoetin (ARANESP) injection - DIALYSIS  25 mcg Intravenous Q Wed-HD  . ezetimibe  10 mg Oral Daily  . fenofibrate  54 mg Oral Daily  . gabapentin  400 mg Oral QHS  . insulin aspart  0-9 Units Subcutaneous TID WC  . insulin aspart  3 Units Subcutaneous TID WC  . insulin glargine  6 Units Subcutaneous QHS  . multivitamin  1 tablet Oral QHS  . pantoprazole  40 mg Oral Q1200  . sevelamer  2,400 mg Oral TID WC  . Warfarin - Pharmacist Dosing Inpatient   Does not apply q1800   I  have reviewed scheduled and prn medications.  Physical Exam: General: NAD, , Appropriat Heart=RRR no rug or murmur Lungs:= CTA Abdomen: BS =+. Soft, nontender Extremities: Dialysis Access: Right BKA  Wrap dry  clean/ no pedal edma / Pos. Bruit  R U A  AVGG  Dialysis Orders: Center: [facility] on MWF Optiflux 180  EDW 78.5 HD Bath 2K 2.0 Ca Time3.5 Heparin 7000. Access right upper  AVGG BFR 500 DFR 800  Zemplar 18 mcg IV/HD Epogen 1000 Units IV/HD Venofer none Other profile 4; ferritins consistently in the 2000s and tsats in the 20s   Assessment/Plan:  1. S/P right BKA- per Dr. Lajoyce Corners on 10/21. For REHAB TRANSFER TODAY/ No heparin HD Wed and In AM 2. ESRD - MWF - lower edw at dc with BKA , below edw yesterday at 72.3kg 3. Hypertension/volume -123/59 this am / on low dose norvasc at HS 4. Anemia - Hgb stable at 9.1. On Epo as outpt; will give 25 of Aranesp today.Monitor jhgb and increase Aranesp if further drop in hgb 5. Metabolic bone disease - Prone to hypercalcemia due to high dose zemplar( 530 pth  On 9//2013 labs) - on 2 Ca bath; Corrected =Ca 10.7 continue sensipar and zemplar;  P  at 6.1 Follow labs pre hd ion am . 6. Nutrition -  Alb 2.5 down from 4.8 on 9/18 and gradually losing weight - likely due to Grangrene  Needing to start ONSP after discharge. 7. VTE prophylaxis - INR = 4.12 on coumadin //??? ? Duration       8. Diabetes mellitus - 105 cbg/ on  Lantus + SSI;   Lenny Pastel, PA-C [facility] Beeper [phone] 12/15/2011,10:41 AM  LOS: 3 days   Patient seen and examined and agree with assessment and plan as above. For discharge to CIR today. HD tomorrow. Stable from renal standpoint after R BKA. Lowering dry weight accordingly.  Vinson Moselle  MD [facility] [phone] pgr    [phone] cell 12/15/2011, 12:02 PM

## 2011-12-15 NOTE — Plan of Care (Addendum)
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  Functional Problem List  Patient demonstrates impairments in the following areas: Balance, Bowel, Cognition, Endurance, Medication Management, Motor, Nutrition, Pain, Perception and Sensory   Basic ADL's: eating, grooming, bathing, dressing and toileting   Transfers:  bed mobility, bed to chair and car Locomotion:  wheelchair mobility  Additional Impairments:  Functional use of upper extremity  Anticipated Outcomes Item Anticipated Outcome  Eating/Swallowing    Basic self-care  Min Assist  Tolieting  Min assist  Bowel/Bladder  Continent bowel, bm q2days, prn meds only, patient is anuric  Transfers  Min assist basic and car transfers  Locomotion  Supervision w/c mobility x 150' controlled, 50' home environment  Communication    Cognition  Min Assist-Supervision  Pain  Pain 3 or less on 1-10 scale  Safety/Judgment    Other  Skin: skin will remain intact and surgical dressing will heal with mod assist of caregiver.    Therapy Plan: PT Frequency: 1-2 X/day, 60-90 minutes;5 out of 7 days OT Frequency: 1-2 X/day, 60-90 minutes SLP Frequency: 5 out of 7 days;1-2 X/day, 30-60 minutes   Team Interventions: Item RN PT OT SLP SW TR Other  Self Care/Advanced ADL Retraining   x      Neuromuscular Re-Education  x       Therapeutic Activities  x x x     UE/LE Strength Training/ROM  x x      UE/LE Coordination Activities  x x      Visual/Perceptual Remediation/Compensation         DME/Adaptive Equipment Instruction  x x      Therapeutic Exercise  x x      Balance/Vestibular Training  x x      Patient/Family Education x x x x     Cognitive Remediation/Compensation    x     Functional Mobility Training  x x      Ambulation/Gait Training  x       Higher education careers adviser Propulsion/Positioning  x       Energy manager         Bladder Management n/a        Bowel Management x        Disease Management/Prevention x        Pain Management x        Medication Management x        Skin Care/Wound Management x        Splinting/Orthotics  x       Discharge Planning  x x x     Psychosocial Support                            Team Discharge Planning: Destination:  Home Projected Follow-up:  PT, OT, SLP and Home Health Projected Equipment Needs:  Wheelchair Patient/family involved in discharge planning:  Yes  MD ELOS: 2 wks Medical Rehab Prognosis:  Fair Assessment: 56 yo female with CVA and 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  Now requiring  24/7 rehab RN/MD, CIR level PT/OT

## 2011-12-15 NOTE — PMR Pre-admission (Signed)
PMR Admission Coordinator Pre-Admission Assessment  Patient: Faith [name] is an 56 y.o., female MRN: [id number] DOB: 06/12/1955 Height: 5' 4.5" (163.8 cm) Weight: 72.3 kg (159 lb 6.3 oz)  Insurance Information HMO:      PPO:       PCP:       IPA:       80/20:       OTHER:   PRIMARY: [facility]      Policy#: [id number]      Subscriber: Birdie Sons CM Name:        Phone#:       Fax#:   Pre-Cert#: None required      Employer: Disabled Benefits:  Phone #: [phone]     Name: Melany Guernsey Eff. Date:     Deduct:        Out of Pocket Max:        Life Max:   CIR:  Covered      SNF: Covered Outpatient: Covered     Co-Pay:   Home Health: Covered      Co-Pay:   DME: Covered     Co-Pay:   Providers: Covered services according to contract with [facility]  Emergency Contact Information Contact Information    Name Relation Home Work [place] A Spouse [phone]  [phone]   Jori Moll [phone]       Current Medical History  Patient Admitting Diagnosis:  R BKA History of Present Illness: A 56 y.o. 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 Admitted 12/12/2011 with gangrenous right foot and recent right second ray indication 11/24/2011.  Patient with ongoing necrosis and gangrenous changes of right foot and limb was not felt to be salvageable. 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 Lajoyce Corners. Postoperative pain management. Placed on Coumadin for DVT prophylaxis. Hemodialysis ongoing as per [facility]. Physical and occupational therapy evaluations completed and ongoing noting issues with fatigue. Recommendations were made for physical medicine rehabilitation consult to consider inpatient rehabilitation services and consult received today.       Past  Medical History  Past Medical History  Diagnosis Date  . Hyperparathyroidism, secondary renal   . CAD (coronary artery disease) of artery bypass graft     CABG in 2002  . PAD (peripheral artery disease)     Not a candidate for revascularization  . Chronic constipation   . GERD (gastroesophageal reflux disease)   . Anemia of chronic renal failure   . Tardive dyskinesia     Secondary to Reglan  . Depression   . Neuropathy   . Diabetic retinopathy(362.0)   . Diastolic CHF, chronic     Grade 2  . Pulmonary embolism 06/2003  . Hyperlipidemia   . Myocardial infarction 2002  . Stroke 2002    "w/left side" residual weakness  . Dialysis patient 01/17/11    [name]; M, W, F; via right upper arm AVF  . Diabetes mellitus     type II  . CHF (congestive heart failure)   . CVA (cerebral infarction)     right brain with minimal left hemiparesis  . Pneumonia 10/2009; 11/2009    ?pneumonia 01/17/11  . Hypertension   . Arthritis   . Complication of anesthesia     "sleepy afterwards; due to her being dialysis pt"  . ESRD (end stage renal disease) on  dialysis     M-W-Fr  [name]    Family History  family history includes Cirrhosis in her mother; Diabetes in her father, mother, and sisters; Heart attack in her father; Heart disease in her brother and father; Hyperlipidemia in her brother, father, and sister; and Hypertension in her sister.  Prior Rehab/Hospitalizations: Receiving therapies on Tu and Th through [facility].   Current Medications  Current facility-administered medications:0.9 %  sodium chloride infusion, , Intravenous, Continuous, Nadara Mustard, MD, 10 mL/hr at 12/12/11 2246;  0.9 %  sodium chloride infusion, 100 mL, Intravenous, PRN, Sheffield Slider, PA;  0.9 %  sodium chloride infusion, 100 mL, Intravenous, PRN, Sheffield Slider, PA;  acetaminophen (TYLENOL) suppository 650 mg, 650 mg, Rectal, Q6H PRN, Sheffield Slider, PA acetaminophen (TYLENOL) tablet 650  mg, 650 mg, Oral, Q6H PRN, Sheffield Slider, PA;  albuterol (PROVENTIL HFA;VENTOLIN HFA) 108 (90 BASE) MCG/ACT inhaler 2 puff, 2 puff, Inhalation, Q4H PRN, Nadara Mustard, MD;  amLODipine (NORVASC) tablet 5 mg, 5 mg, Oral, QHS, Nadara Mustard, MD, 5 mg at 12/13/11 2305;  aspirin EC tablet 81 mg, 81 mg, Oral, Daily, Nadara Mustard, MD, 81 mg at 12/15/11 1004 calcium carbonate (dosed in mg elemental calcium) suspension 500 mg of elemental calcium, 500 mg of elemental calcium, Oral, Q6H PRN, Sheffield Slider, PA;  camphor-menthol [facility]) lotion 1 application, 1 application, Topical, Q8H PRN, Sheffield Slider, PA;  cinacalcet (SENSIPAR) tablet 30 mg, 30 mg, Oral, Daily, Nadara Mustard, MD, 30 mg at 12/15/11 1004 cinacalcet (SENSIPAR) tablet 90 mg, 90 mg, Oral, QPM, Nadara Mustard, MD, 90 mg at 12/14/11 1753;  darbepoetin (ARANESP) 25 MCG/0.42ML injection, , , , ;  darbepoetin (ARANESP) injection 25 mcg, 25 mcg, Intravenous, Q Wed-HD, Sheffield Slider, PA, 25 mcg at 12/14/11 1004;  docusate sodium (ENEMEEZ) enema 283 mg, 1 enema, Rectal, PRN, Sheffield Slider, PA;  ezetimibe (ZETIA) tablet 10 mg, 10 mg, Oral, Daily, Nadara Mustard, MD, 10 mg at 12/15/11 1004 feeding supplement (NEPRO CARB STEADY) liquid 237 mL, 237 mL, Oral, PRN, Sheffield Slider, PA;  fenofibrate tablet 54 mg, 54 mg, Oral, Daily, Nadara Mustard, MD, 54 mg at 12/15/11 1004;  gabapentin (NEURONTIN) capsule 400 mg, 400 mg, Oral, QHS, Nadara Mustard, MD, 400 mg at 12/15/11 0004;  Glycerin (Adult) 2.1 G suppository 1 suppository, 1 suppository, Rectal, PRN, Sheffield Slider, PA hydrOXYzine (ATARAX/VISTARIL) tablet 25 mg, 25 mg, Oral, Q8H PRN, Sheffield Slider, PA;  insulin aspart (novoLOG) injection 0-9 Units, 0-9 Units, Subcutaneous, TID WC, Nadara Mustard, MD, 5 Units at 12/15/11 0902;  insulin aspart (novoLOG) injection 3 Units, 3 Units, Subcutaneous, TID WC, Nadara Mustard, MD, 3 Units at 12/15/11 0901;  insulin glargine (LANTUS) injection 6 Units, 6  Units, Subcutaneous, QHS, Nadara Mustard, MD lidocaine (XYLOCAINE) 1 % injection 5 mL, 5 mL, Intradermal, PRN, Sheffield Slider, PA;  lidocaine-prilocaine (EMLA) cream 1 application, 1 application, Topical, PRN, Sheffield Slider, PA;  multivitamin (RENA-VIT) tablet 1 tablet, 1 tablet, Oral, QHS, Nadara Mustard, MD, 1 tablet at 12/15/11 0003;  naphazoline (NAPHCON) 0.1 % ophthalmic solution 1 drop, 1 drop, Both Eyes, QID PRN, Nadara Mustard, MD nitroGLYCERIN (NITROSTAT) SL tablet 0.4 mg, 0.4 mg, Sublingual, Q5 min PRN, Nadara Mustard, MD;  ondansetron [facility]) injection 4 mg, 4 mg, Intravenous, Q6H PRN, Sheffield Slider, PA;  ondansetron (ZOFRAN) tablet 4 mg, 4 mg, Oral,  Q6H PRN, Sheffield Slider, PA;  oxyCODONE-acetaminophen (PERCOCET/ROXICET) 5-325 MG per tablet 1-2 tablet, 1-2 tablet, Oral, Q4H PRN, Nadara Mustard, MD pantoprazole (PROTONIX) EC tablet 40 mg, 40 mg, Oral, Q1200, Nadara Mustard, MD, 40 mg at 12/14/11 1225;  pentafluoroprop-tetrafluoroeth (GEBAUERS) aerosol 1 application, 1 application, Topical, PRN, Sheffield Slider, PA;  polyvinyl alcohol (LIQUIFILM TEARS) 1.4 % ophthalmic solution 1 drop, 1 drop, Both Eyes, PRN, Nadara Mustard, MD;  sevelamer (RENVELA) tablet 2,400 mg, 2,400 mg, Oral, TID WC, Sheffield Slider, PA, 2,400 mg at 12/15/11 0901 sorbitol 70 % solution 30 mL, 30 mL, Oral, PRN, Sheffield Slider, PA, 30 mL at 12/15/11 0126;  traMADol (ULTRAM) tablet 50 mg, 50 mg, Oral, Q6H PRN, Nadara Mustard, MD;  Warfarin - Pharmacist Dosing Inpatient, , Does not apply, W0981, Nadara Mustard, MD;  zolpidem (AMBIEN) tablet 5 mg, 5 mg, Oral, QHS PRN, Sheffield Slider, PA DISCONTD: HYDROcodone-acetaminophen (NORCO/VICODIN) 5-325 MG per tablet 1-2 tablet, 1-2 tablet, Oral, Q4H PRN, Nadara Mustard, MD, 2 tablet at 12/13/11 0957;  DISCONTD: HYDROmorphone (DILAUDID) injection 0.5-1 mg, 0.5-1 mg, Intravenous, Q2H PRN, Nadara Mustard, MD, 1 mg at 12/14/11 1208;  DISCONTD: traMADol (ULTRAM) tablet 50 mg, 50 mg,  Oral, Q4H PRN, Nadara Mustard, MD  Patients Current Diet: Renal  Precautions / Restrictions Precautions Precautions: Fall Precaution Comments: s/p CVA  with L hemiparesis Restrictions Weight Bearing Restrictions: Yes RLE Weight Bearing: Non weight bearing   Prior Activity Level Community (5-7x/wk): Goes out M-W-F for HD and [facility] see her for therapies. Home Assistive Devices / Equipment Home Assistive Devices/Equipment: Cane (specify quad or straight);Walker (specify type) Home Adaptive Equipment: Bedside commode/3-in-1;Walker - rolling;Wheelchair - manual;Tub transfer bench  Prior Functional Level Prior Function Level of Independence: Needs assistance Needs Assistance: Bathing;Dressing Bath: Minimal Dressing: Minimal Able to Take Stairs?: No Driving: No Vocation: On disability Comments: uses wheelchair for community access; used RW to walk in home prior to BKA; [facility] program tues and thurs (they transport her in a [facility]), HD MWF  Current Functional Level Cognition  Arousal/Alertness: Lethargic Overall Cognitive Status: Impaired Current Attention Level: Focused Attention - Other Comments: internally distracted Orientation Level: Oriented to person;Oriented to place;Oriented to time;Oriented to situation Safety/Judgement: Decreased awareness of safety precautions;Decreased safety judgement for tasks assessed;Decreased awareness of need for assistance Safety/Judgement - Other Comments: mod vc functional basic tasks Cognition - Other Comments: Demonstrating perseveration today motorically and verbally, ataxic type movements.     Extremity Assessment (includes Sensation/Coordination)  RUE ROM/Strength/Tone: [facility] for tasks assessed RUE Sensation: Deficits;History of peripheral neuropathy RUE Coordination: Deficits;WFL - fine motor RUE Coordination Deficits: decreased in hand manipulation  RLE ROM/Strength/Tone: Deficits RLE ROM/Strength/Tone Deficits: s/p BKA; painful with motion;  +quad activation, and able to achieve at or near full knee extension with multiple reps, and max cues (verbal and gentle tactile cueing)    ADLs  Eating/Feeding: Performed;Minimal assistance;Other (comment) (difficulty holding cup) Where Assessed - Eating/Feeding: Bed level Grooming: Wash/dry face;Other (comment) (mod vc to maintain attention to task) Where Assessed - Grooming: Supported sitting Upper Body Bathing: Performed;Moderate assistance Where Assessed - Upper Body Bathing: Supported sitting Lower Body Bathing: Performed;Maximal assistance Where Assessed - Lower Body Bathing: Rolling right and/or left Upper Body Dressing: Performed;Minimal assistance Where Assessed - Upper Body Dressing: Supported sitting Lower Body Dressing: Performed;+1 Total assistance Where Assessed - Lower Body Dressing: Rolling right and/or left Toilet Transfer: Performed;+2 Total assistance Toilet Transfer: Patient Percentage: 30% Toilet Transfer Method: Sit  to stand;Stand pivot Acupuncturist: Other (comment) (bed - chair) Toileting - Clothing Manipulation and Hygiene: Performed;+1 Total assistance Equipment Used: Gait belt;Rolling walker Transfers/Ambulation Related to ADLs: total A transitional movements ADL Comments: Pt extremely lethargic today. Able to increase to alert state, but at appeared confused, had difficulty following command and reported feeling differently. Educated husband on proper positioning of RLE to prevent knee flexor contracture. Educated pt/;husband on desensitization techniques    Mobility  Bed Mobility: Supine to Sit;Sitting - Scoot to [place] of Bed Rolling Right: 3: Mod assist Rolling Left: 3: Mod assist Left Sidelying to Sit: 4: Min assist;HOB flat Supine to Sit: 3: Mod assist;With rails Sitting - Scoot to Edge of Bed: 3: Mod assist;With rail    Transfers  Transfers: Lateral/Scoot Occupational hygienist Sit to Stand: 1: +2 Total assist;With upper  extremity assist;From bed Sit to Stand: Patient Percentage: 30% Stand to Sit: 1: +2 Total assist;With upper extremity assist;To chair/3-in-1 Stand to Sit: Patient Percentage: 20% Squat Pivot Transfers: 1: +2 Total assist Squat Pivot Transfers: Patient Percentage: 50% Lateral/Scoot Transfers: 3: Mod assist (simulated lateral scoot transfer with scooting at EOB)    Ambulation / Gait / Stairs / Producer, television/film/video Sitting - Balance Support: No upper extremity supported;Feet supported Static Sitting - Level of Assistance: 4: Min assist Static Sitting - Comment/# of Minutes: 3 min  Dynamic Sitting Balance Dynamic Sitting - Balance Support: Feet supported;Right upper extremity supported;Left upper extremity supported Dynamic Sitting - Level of Assistance: 4: Min assist Dynamic Sitting Balance - Compensations: decreased BOS and vision deficits altering sense of midline Dynamic Sitting - Comments: Fear of falling. Poorsense of midline Static Standing Balance Static Standing - Balance Support: Bilateral upper extremity supported;During functional activity     Previous Home Environment Living Arrangements: Spouse/significant other Lives With: Spouse Available Help at Discharge: Family;Personal care attendant;Available PRN/intermittently ([facility] prgm) Type of Home: House Home Layout: One level Home Access: Level entry Bathroom Shower/Tub: Engineer, manufacturing systems: Standard Bathroom Accessibility: Yes How Accessible: Accessible via walker Home Care Services: No  Discharge Living Setting Plans for Discharge Living Setting: Patient's home;House;Lives with (comment) Type of Home at Discharge: House Discharge Home Layout: One level Discharge Home Access: Level entry (No steps at back entry.) Do you have any problems obtaining your medications?: No  Social/Family/Support Systems Patient Roles: Spouse;Parent Contact Information:  Llana Zhou - spouse [phone]; Jamey Ripa - sister [phone] Anticipated Caregiver: Has personal care attendant through [facility].  Husband and sister intermittently. Ability/Limitations of Caregiver: Husband works in Personnel [employer] during the day and does floors at night.  Sister local about 10 minutes away. (Has a son who lives in [place] and works.) Caregiver Availability: Intermittent Discharge Plan Discussed with Primary Caregiver: Yes Is Caregiver In Agreement with Plan?: Yes Does Caregiver/Family have Issues with Lodging/Transportation while Pt is in Rehab?: No  Goals/Additional Needs Patient/Family Goal for Rehab: PT mod I/S, OT mod I to min A, no ST needs Expected length of stay: 7-10 days Cultural Considerations: [employer] Dietary Needs: Renal 80/90-2-03-25-1198 cc fluids Equipment Needs: TBD Pt/Family Agrees to Admission and willing to participate: Yes Program Orientation Provided & Reviewed with Pt/Caregiver Including Roles  & Responsibilities: Yes  Patient Condition: This patient's condition remains as documented in the Consult dated 12/15/11, in which the Rehabilitation Physician determined and documented that the patient's condition is appropriate for intensive rehabilitative care in an inpatient rehabilitation facility.  Preadmission Screen Completed By:  Trish Mage, 12/15/2011 10:31 AM ______________________________________________________________________   Discussed status with Dr. Riley Kill on 12/15/11 at 1042 and received telephone approval for admission today.  Admission Coordinator:  Trish Mage, time1042/Date10/24/13

## 2011-12-15 NOTE — Consult Note (Signed)
Physical Medicine and Rehabilitation Consult Reason for Consult: Right BKA Referring Physician: Dr. Lajoyce Corners   HPI: Faith [name] is a 56 y.o. 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 Admitted 12/12/2011 with gangrenous right foot and recent right second ray indication 11/24/2011. Patient with ongoing necrosis and gangrenous changes of right foot and limb was not felt to be salvageable. 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 Lajoyce Corners. Postoperative pain management. Placed on Coumadin for DVT prophylaxis. Hemodialysis ongoing as per [facility]. Physical and occupational therapy evaluations completed and ongoing noting issues with fatigue. Recommendations were made for physical medicine rehabilitation consult to consider inpatient rehabilitation services and consult received today  Review of Systems  Cardiovascular: Positive for leg swelling.  Gastrointestinal: Positive for constipation.  Musculoskeletal: Positive for myalgias and joint pain.  Neurological: Positive for tingling.   Past Medical History  Diagnosis Date  . Hyperparathyroidism, secondary renal   . CAD (coronary artery disease) of artery bypass graft     CABG in 2002  . PAD (peripheral artery disease)     Not a candidate for revascularization  . Chronic constipation   . GERD (gastroesophageal reflux disease)   . Anemia of chronic renal failure   . Tardive dyskinesia     Secondary to Reglan  . Depression   . Neuropathy   . Diabetic retinopathy(362.0)   . Diastolic CHF, chronic     Grade 2  . Pulmonary embolism 06/2003  . Hyperlipidemia   . Myocardial infarction 2002  . Stroke 2002    "w/left side" residual weakness  . Dialysis patient 01/17/11    Faith [name]; M, W, F; via right upper arm AVF  . Diabetes mellitus      type II  . CHF (congestive heart failure)   . CVA (cerebral infarction)     right brain with minimal left hemiparesis  . Pneumonia 10/2009; 11/2009    ?pneumonia 01/17/11  . Hypertension   . Arthritis   . Complication of anesthesia     "sleepy afterwards; due to her being dialysis pt"  . ESRD (end stage renal disease) on dialysis     M-W-Fr  Faith [name]   Past Surgical History  Procedure Date  . Coronary artery bypass graft 2002  . Laparoscopic cholecystectomy 2002  . Av fistula placement     times 4-using rt upper arm graft-old rt lower arm,lt upper and lower old grafts  . Trigger finger release   . Tubal ligation   . Eye examination under anesthesia w/ retinal cryotherapy and retinal laser   . Cataract extraction 07/2010    right sided   . Toe amputation 5/12    left 2nd toe secondary to osteomyelitis  . Breast biopsy 06/2010  . Carpal tunnel release 05/05/2011    Procedure: CARPAL TUNNEL RELEASE;  Surgeon: Sharma Covert, MD;  Location: [facility];  Service: Orthopedics;  Laterality: Right;  . Coronary angioplasty with stent placement   . Amputation 11/24/2011    Procedure: AMPUTATION RAY;  Surgeon: Nadara Mustard, MD;  Location: [facility] OR;  Service: Orthopedics;  Laterality: Right;  Right Foot 2nd Ray Amputation  . Amputation 12/12/2011    Procedure: AMPUTATION BELOW KNEE;  Surgeon: Nadara Mustard, MD;  Location: [facility] OR;  Service: Orthopedics;  Laterality: Right;   Family History  Problem Relation Age of Onset  .  Diabetes Mother   . Cirrhosis Mother   . Diabetes Father   . Heart attack Father   . Hyperlipidemia Father   . Heart disease Father   . Diabetes Sister   . Hyperlipidemia Sister   . Hypertension Sister   . Diabetes Sister   . Diabetes Sister   . Heart disease Brother   . Hyperlipidemia Brother    Social History:  reports that she has never smoked. She has never used smokeless tobacco. She reports that she does not drink alcohol or use illicit  drugs. Allergies:  Allergies  Allergen Reactions  . Metoclopramide Hcl Other (See Comments)    Tardive dyskinesia  . Codeine Nausea And Vomiting  . Lipitor (Atorvastatin Calcium) Other (See Comments)    Muscle weakness   Medications Prior to Admission  Medication Sig Dispense Refill  . acetaminophen (TYLENOL ARTHRITIS PAIN) 650 MG CR tablet Take 650 mg by mouth every 8 (eight) hours as needed. For pain      . albuterol (PROVENTIL HFA;VENTOLIN HFA) 108 (90 BASE) MCG/ACT inhaler Inhale 2 puffs into the lungs every 4 (four) hours as needed. For shortness of breath      . amLODipine (NORVASC) 5 MG tablet Take 5 mg by mouth at bedtime.       Marland Kitchen aspirin EC 81 MG tablet Take 81 mg by mouth daily.      . cinacalcet (SENSIPAR) 30 MG tablet Take 30 mg by mouth daily. Takes 30mg  in the morning and 90mg  at night      . cinacalcet (SENSIPAR) 90 MG tablet Take 90 mg by mouth every evening. Takes 30mg  in the morning and 90mg  at night      . ezetimibe (ZETIA) 10 MG tablet Take 10 mg by mouth daily.      . fenofibrate micronized (LOFIBRA) 134 MG capsule Take 134 mg by mouth daily.       Marland Kitchen gabapentin (NEURONTIN) 400 MG capsule Take 400 mg by mouth at bedtime.       Marland Kitchen guaiFENesin (ROBITUSSIN) 100 MG/5ML SOLN Take 15 mLs by mouth every 6 (six) hours as needed. For coughing/wheezing      . HYDROcodone-acetaminophen (VICODIN) 5-500 MG per tablet Take 1 tablet by mouth 2 (two) times daily as needed. For pain      . insulin aspart (NOVOLOG) 100 UNIT/ML injection Inject 4-12 Units into the skin 3 (three) times daily before meals. Per sliding scale      . insulin glargine (LANTUS) 100 UNIT/ML injection Inject 6 Units into the skin at bedtime.       . multivitamin (RENA-VIT) TABS tablet Take 1 tablet by mouth daily.       . nitroGLYCERIN (NITROSTAT) 0.4 MG SL tablet Place 0.4 mg under the tongue every 5 (five) minutes as needed. For chest pain      . pantoprazole (PROTONIX) 40 MG tablet Take 40 mg by mouth at  bedtime.       . polyvinyl alcohol (LIQUIFILM TEARS) 1.4 % ophthalmic solution Place 1 drop into both eyes daily as needed. For dryness      . sevelamer (RENAGEL) 800 MG tablet Take 1,600-3,200 mg by mouth See admin instructions. Take 4 tablets with meals and 2 tablets with snacks      . tetrahydrozoline 0.05 % ophthalmic solution Place 1 drop into both eyes daily.      Marland Kitchen DISCONTD: HYDROcodone-acetaminophen (NORCO/VICODIN) 5-325 MG per tablet Take 1-2 tablets by mouth every 4 (four) hours as needed.  30 tablet  0    Home: Home Living Lives With: Spouse Available Help at Discharge: Family;Personal care attendant;Available PRN/intermittently (PACE prgm) Type of Home: House Home Access: Level entry Home Layout: One level Bathroom Shower/Tub: Engineer, manufacturing systems: Standard Bathroom Accessibility: Yes How Accessible: Accessible via walker Home Adaptive Equipment: Bedside commode/3-in-1;Walker - rolling;Wheelchair - manual;Tub transfer bench  Functional History: Prior Function Bath: Minimal Dressing: Minimal Able to Take Stairs?: No Driving: No Vocation: On disability Comments: uses wheelchair for community access; used RW to walk in home prior to BKA; PACE program tues and thurs (they transport her in a [place]), HD MWF Functional Status:  Mobility: Bed Mobility Bed Mobility: Supine to Sit;Sitting - Scoot to [place] of Bed Rolling Right: 3: Mod assist Rolling Left: 3: Mod assist Left Sidelying to Sit: 4: Min assist;HOB flat Supine to Sit: 3: Mod assist;With rails Sitting - Scoot to Edge of Bed: 3: Mod assist;With rail Transfers Transfers: Lateral/Scoot Programmer, systems Transfers Sit to Stand: 1: +2 Total assist;With upper extremity assist;From bed Sit to Stand: Patient Percentage: 30% Stand to Sit: 1: +2 Total assist;With upper extremity assist;To chair/3-in-1 Stand to Sit: Patient Percentage: 20% Squat Pivot Transfers: 1: +2 Total assist Squat Pivot Transfers: Patient  Percentage: 50% Lateral/Scoot Transfers: 3: Mod assist (simulated lateral scoot transfer with scooting at EOB)      ADL: ADL Eating/Feeding: Performed;Minimal assistance;Other (comment) (difficulty holding cup) Where Assessed - Eating/Feeding: Bed level Grooming: Wash/dry face;Other (comment) (mod vc to maintain attention to task) Where Assessed - Grooming: Supported sitting Upper Body Bathing: Performed;Moderate assistance Where Assessed - Upper Body Bathing: Supported sitting Lower Body Bathing: Performed;Maximal assistance Where Assessed - Lower Body Bathing: Rolling right and/or left Upper Body Dressing: Performed;Minimal assistance Where Assessed - Upper Body Dressing: Supported sitting Lower Body Dressing: Performed;+1 Total assistance Where Assessed - Lower Body Dressing: Rolling right and/or left Toilet Transfer: Performed;+2 Total assistance Toilet Transfer Method: Sit to stand;Stand pivot Toilet Transfer Equipment: Other (comment) (bed - chair) Equipment Used: Gait belt;Rolling walker Transfers/Ambulation Related to ADLs: total A transitional movements ADL Comments: Pt extremely lethargic today. Able to increase to alert state, but at appeared confused, had difficulty following command and reported feeling differently. Educated husband on proper positioning of RLE to prevent knee flexor contracture. Educated pt/;husband on desensitization techniques  Cognition: Cognition Arousal/Alertness: Lethargic Orientation Level: Oriented X4 Cognition Overall Cognitive Status: Impaired Area of Impairment: Attention Arousal/Alertness: Lethargic Orientation Level: Appears intact for tasks assessed Behavior During Session: Lethargic Current Attention Level: Focused Attention - Other Comments: internally distracted Safety/Judgement: Decreased awareness of safety precautions;Decreased safety judgement for tasks assessed;Decreased awareness of need for assistance Safety/Judgement -  Other Comments: mod vc functional basic tasks Cognition - Other Comments: Demonstrating perseveration today motorically and verbally, ataxic type movements.   Blood pressure 123/59, pulse 100, temperature 99.7 F (37.6 C), temperature source Oral, resp. rate 16, height 5' 4.5" (1.638 m), weight 72.3 kg (159 lb 6.3 oz), SpO2 100.00%. Physical Exam  Vitals reviewed. Constitutional: She is oriented to person, place, and time. She appears well-developed and well-nourished.  HENT:  Head: Normocephalic and atraumatic.  Eyes: Conjunctivae normal and EOM are normal. Pupils are equal, round, and reactive to light.       Pupils reactive to light  Neck: Neck supple. No thyromegaly present.  Cardiovascular: Normal rate, regular rhythm and normal heart sounds.  Exam reveals no gallop and no friction rub.   No murmur heard. Pulmonary/Chest: Effort normal and breath sounds normal. No respiratory  distress. She has no wheezes.  Abdominal: Bowel sounds are normal. She exhibits no distension. There is no tenderness.  Musculoskeletal:       Right leg expectedly tender with swelling  Neurological: She is alert and oriented to person, place, and time. No cranial nerve deficit.       Follows full commands. UE's 4-5/5. Left Lower ext 4/5. RLE is tender and she's unable to flex hip at this point due to pain. Mild distal sensory loss in the left foot.  Skin: Skin is dry.       Surgical dressing in place to BKA site. Left leg/foot intact. Skin is quite dry  Psychiatric: She has a normal mood and affect. Her behavior is normal. Judgment and thought content normal.    Results for orders placed during the hospital encounter of 12/12/11 (from the past 24 hour(s))  GLUCOSE, CAPILLARY     Status: Abnormal   Collection Time   12/14/11 11:13 AM      Component Value Range   Glucose-Capillary 140 (*) 70 - 99 mg/dL  GLUCOSE, CAPILLARY     Status: Abnormal   Collection Time   12/14/11  5:12 PM      Component Value Range    Glucose-Capillary 155 (*) 70 - 99 mg/dL   Comment 1 Notify RN     Comment 2 Documented in Chart    GLUCOSE, CAPILLARY     Status: Abnormal   Collection Time   12/14/11  8:55 PM      Component Value Range   Glucose-Capillary 177 (*) 70 - 99 mg/dL  PROTIME-INR     Status: Abnormal   Collection Time   12/15/11  6:15 AM      Component Value Range   Prothrombin Time 37.4 (*) 11.6 - 15.2 seconds   INR 4.12 (*) 0.00 - 1.49  GLUCOSE, CAPILLARY     Status: Abnormal   Collection Time   12/15/11  7:53 AM      Component Value Range   Glucose-Capillary 282 (*) 70 - 99 mg/dL   Comment 1 Notify RN     Comment 2 Documented in Chart     No results found.  Assessment/Plan: Diagnosis: Right BKA 1. Does the need for close, 24 hr/day medical supervision in concert with the patient's rehab needs make it unreasonable for this patient to be served in a less intensive setting? Yes 2. Co-Morbidities requiring supervision/potential complications: ESRD, DM, CAD, GERD, depression 3. Due to bladder management, bowel management, safety, skin/wound care, disease management, medication administration, pain management and patient education, does the patient require 24 hr/day rehab nursing? Yes 4. Does the patient require coordinated care of a physician, rehab nurse, PT (1-2 hrs/day, 5 days/week) and OT (1-2 hrs/day, 5 days/week) to address physical and functional deficits in the context of the above medical diagnosis(es)? Yes Addressing deficits in the following areas: balance, endurance, locomotion, strength, transferring, bowel/bladder control, bathing, dressing, feeding, grooming and toileting 5. Can the patient actively participate in an intensive therapy program of at least 3 hrs of therapy per day at least 5 days per week? Yes 6. The potential for patient to make measurable gains while on inpatient rehab is excellent 7. Anticipated functional outcomes upon discharge from inpatient rehab are mod I to  supervision with PT, mod I to min assist with OT, n/a with SLP. 8. Estimated rehab length of stay to reach the above functional goals is: 7-10 days 9. Does the patient have adequate social supports to  accommodate these discharge functional goals? Yes 10. Anticipated D/C setting: Home 11. Anticipated post D/C treatments: HH therapy 12. Overall Rehab/Functional Prognosis: excellent  RECOMMENDATIONS: This patient's condition is appropriate for continued rehabilitative care in the following setting: CIR Patient has agreed to participate in recommended program. Yes Note that insurance prior authorization may be required for reimbursement for recommended care.  Comment: Rehab RN to follow up.   Ivory Broad, MD     12/15/2011

## 2011-12-15 NOTE — Progress Notes (Signed)
Orthopedic Tech Progress Note Patient Details:  Faith [name] January 19, 1956 [id number]  Ortho Devices Type of Ortho Device: Knee Immobilizer   Haskell Flirt 12/15/2011, 7:25 AM

## 2011-12-15 NOTE — H&P (Signed)
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 Lajoyce Corners. Post op started on coumadin for VTE prophylaxis. KI ordered to help keep knee in extension. She did have increased lethargy with confusion yesterday that has resolved. Therapy team recommending CIR.  Review of Systems  HENT: Negative for hearing loss.  Eyes: Positive for blurred vision.  Respiratory: Positive for cough. Negative for shortness of breath.  Cardiovascular: Negative for chest pain and palpitations.  Gastrointestinal: Negative for nausea and vomiting.  Musculoskeletal:  LLE pain  Neurological: Positive for focal weakness (left sided weakness due to stroke) and weakness (poor energy levels/sleepiness for the past month). Negative for dizziness and headaches.  Tardive dyskinesia due to reglan --tongue thrusting   Past Medical History   Diagnosis  Date   .  Hyperparathyroidism, secondary renal    .  CAD (coronary artery disease) of artery bypass graft      CABG in 2002   .  PAD (peripheral artery disease)      Not a candidate for revascularization   .  Chronic constipation    .  GERD (gastroesophageal reflux disease)    .  Anemia of chronic renal failure    .  Tardive dyskinesia      Secondary to Reglan   .  Depression    .  Neuropathy    .  Diabetic retinopathy(362.0)    .  Diastolic CHF, chronic      Grade 2   .  Pulmonary embolism  06/2003   .  Hyperlipidemia    .  Myocardial infarction  2002   .  Stroke  2002     "w/left side" residual weakness   .  Dialysis patient  01/17/11     [name]; M, W, F; via right upper arm AVF   .  Diabetes mellitus      type II   .  CHF (congestive heart  failure)    .  CVA (cerebral infarction)      right brain with minimal left hemiparesis   .  Pneumonia  10/2009; 11/2009     ?pneumonia 01/17/11   .  Hypertension    .  Arthritis    .  Complication of anesthesia      "sleepy afterwards; due to her being dialysis pt"   .  ESRD (end stage renal disease) on dialysis      M-W-Fr [name]    Past Surgical History   Procedure  Date   .  Coronary artery bypass graft  2002   .  Laparoscopic cholecystectomy  2002   .  Av fistula placement      times 4-using rt upper arm graft-old rt lower arm,lt upper and lower old grafts   .  Trigger finger release    .  Tubal ligation    .  Eye examination under anesthesia w/ retinal cryotherapy and retinal laser    .  Cataract extraction  07/2010     right sided   .  Toe amputation  5/12     left 2nd toe secondary to osteomyelitis   .  Breast biopsy  06/2010   .  Carpal tunnel release  05/05/2011  Procedure: CARPAL TUNNEL RELEASE; Surgeon: Sharma Covert, MD; Location: [facility]; Service: Orthopedics; Laterality: Right;   .  Coronary angioplasty with stent placement    .  Amputation  11/24/2011     Procedure: AMPUTATION RAY; Surgeon: Nadara Mustard, MD; Location: [facility] OR; Service: Orthopedics; Laterality: Right; Right Foot 2nd Ray Amputation   .  Amputation  12/12/2011     Procedure: AMPUTATION BELOW KNEE; Surgeon: Nadara Mustard, MD; Location: [facility] OR; Service: Orthopedics; Laterality: Right;    Family History   Problem  Relation  Age of Onset   .  Diabetes  Mother    .  Cirrhosis  Mother    .  Diabetes  Father    .  Heart attack  Father    .  Hyperlipidemia  Father    .  Heart disease  Father    .  Diabetes  Sister    .  Hyperlipidemia  Sister    .  Hypertension  Sister    .  Diabetes  Sister    .  Diabetes  Sister    .  Heart disease  Brother    .  Hyperlipidemia  Brother     Social History: reports that she has never smoked. She has never used smokeless tobacco. She reports  that she does not drink alcohol or use illicit drugs.  Allergies   Allergen  Reactions   .  Metoclopramide Hcl  Other (See Comments)     Tardive dyskinesia   .  Codeine  Nausea And Vomiting   .  Lipitor (Atorvastatin Calcium)  Other (See Comments)     Muscle weakness    Scheduled Meds:  .  amLODipine  5 mg  Oral  QHS   .  aspirin EC  81 mg  Oral  Daily   .  cinacalcet  30 mg  Oral  Daily   .  cinacalcet  90 mg  Oral  QPM   .  darbepoetin      .  darbepoetin (ARANESP) injection - DIALYSIS  25 mcg  Intravenous  Q Wed-HD   .  ezetimibe  10 mg  Oral  Daily   .  fenofibrate  54 mg  Oral  Daily   .  gabapentin  400 mg  Oral  QHS   .  insulin aspart  0-9 Units  Subcutaneous  TID WC   .  insulin aspart  3 Units  Subcutaneous  TID WC   .  insulin glargine  6 Units  Subcutaneous  QHS   .  multivitamin  1 tablet  Oral  QHS   .  pantoprazole  40 mg  Oral  Q1200   .  sevelamer  2,400 mg  Oral  TID WC   .  Warfarin - Pharmacist Dosing Inpatient   Does not apply  q1800    Medications Prior to Admission   Medication  Sig  Dispense  Refill   .  acetaminophen (TYLENOL ARTHRITIS PAIN) 650 MG CR tablet  Take 650 mg by mouth every 8 (eight) hours as needed. For pain     .  albuterol (PROVENTIL HFA;VENTOLIN HFA) 108 (90 BASE) MCG/ACT inhaler  Inhale 2 puffs into the lungs every 4 (four) hours as needed. For shortness of breath     .  amLODipine (NORVASC) 5 MG tablet  Take 5 mg by mouth at bedtime.     Marland Kitchen  aspirin EC 81 MG tablet  Take  81 mg by mouth daily.     .  cinacalcet (SENSIPAR) 30 MG tablet  Take 30 mg by mouth daily. Takes 30mg  in the morning and 90mg  at night     .  cinacalcet (SENSIPAR) 90 MG tablet  Take 90 mg by mouth every evening. Takes 30mg  in the morning and 90mg  at night     .  ezetimibe (ZETIA) 10 MG tablet  Take 10 mg by mouth daily.     .  fenofibrate micronized (LOFIBRA) 134 MG capsule  Take 134 mg by mouth daily.     Marland Kitchen  gabapentin (NEURONTIN) 400 MG capsule  Take 400 mg by mouth  at bedtime.     Marland Kitchen  guaiFENesin (ROBITUSSIN) 100 MG/5ML SOLN  Take 15 mLs by mouth every 6 (six) hours as needed. For coughing/wheezing     .  HYDROcodone-acetaminophen (VICODIN) 5-500 MG per tablet  Take 1 tablet by mouth 2 (two) times daily as needed. For pain     .  insulin aspart (NOVOLOG) 100 UNIT/ML injection  Inject 4-12 Units into the skin 3 (three) times daily before meals. Per sliding scale     .  insulin glargine (LANTUS) 100 UNIT/ML injection  Inject 6 Units into the skin at bedtime.     .  multivitamin (RENA-VIT) TABS tablet  Take 1 tablet by mouth daily.     .  nitroGLYCERIN (NITROSTAT) 0.4 MG SL tablet  Place 0.4 mg under the tongue every 5 (five) minutes as needed. For chest pain     .  pantoprazole (PROTONIX) 40 MG tablet  Take 40 mg by mouth at bedtime.     .  polyvinyl alcohol (LIQUIFILM TEARS) 1.4 % ophthalmic solution  Place 1 drop into both eyes daily as needed. For dryness     .  sevelamer (RENAGEL) 800 MG tablet  Take 1,600-3,200 mg by mouth See admin instructions. Take 4 tablets with meals and 2 tablets with snacks     .  tetrahydrozoline 0.05 % ophthalmic solution  Place 1 drop into both eyes daily.     Marland Kitchen  DISCONTD: HYDROcodone-acetaminophen (NORCO/VICODIN) 5-325 MG per tablet  Take 1-2 tablets by mouth every 4 (four) hours as needed.  30 tablet  0    Home:  Home Living  Lives With: Spouse  Available Help at Discharge: Family;Personal care attendant;Available PRN/intermittently ([facility] prgm)  Type of Home: House  Home Access: Level entry  Home Layout: One level  Bathroom Shower/Tub: Medical sales representative: Standard  Bathroom Accessibility: Yes  How Accessible: Accessible via walker  Home Adaptive Equipment: Bedside commode/3-in-1;Walker - rolling;Wheelchair - manual;Tub transfer bench  Functional History:  Prior Function  Bath: Minimal  Dressing: Minimal  Able to Take Stairs?: No  Driving: No  Vocation: On disability  Comments: uses wheelchair for  community access; used RW to walk in home prior to BKA; PACE program tues and thurs (they transport her in a [place]), HD MWF  Functional Status:  Mobility:  Bed Mobility  Bed Mobility: Supine to Sit;Sitting - Scoot to Edge of Bed  Rolling Right: 3: Mod assist  Rolling Left: 3: Mod assist  Left Sidelying to Sit: 4: Min assist;HOB flat  Supine to Sit: 3: Mod assist;With rails  Sitting - Scoot to Edge of Bed: 3: Mod assist;With rail  Transfers  Transfers: Lateral/Scoot Programmer, systems Transfers  Sit to Stand: 1: +2 Total assist;With upper extremity assist;From bed  Sit to Stand: Patient Percentage: 30%  Stand to  Sit: 1: +2 Total assist;With upper extremity assist;To chair/3-in-1  Stand to Sit: Patient Percentage: 20%  Squat Pivot Transfers: 1: +2 Total assist  Squat Pivot Transfers: Patient Percentage: 50%  Lateral/Scoot Transfers: 3: Mod assist (simulated lateral scoot transfer with scooting at EOB)    ADL:  ADL  Eating/Feeding: Performed;Minimal assistance;Other (comment) (difficulty holding cup)  Where Assessed - Eating/Feeding: Bed level  Grooming: Wash/dry face;Other (comment) (mod vc to maintain attention to task)  Where Assessed - Grooming: Supported sitting  Upper Body Bathing: Performed;Moderate assistance  Where Assessed - Upper Body Bathing: Supported sitting  Lower Body Bathing: Performed;Maximal assistance  Where Assessed - Lower Body Bathing: Rolling right and/or left  Upper Body Dressing: Performed;Minimal assistance  Where Assessed - Upper Body Dressing: Supported sitting  Lower Body Dressing: Performed;+1 Total assistance  Where Assessed - Lower Body Dressing: Rolling right and/or left  Toilet Transfer: Performed;+2 Total assistance  Toilet Transfer Method: Sit to stand;Stand pivot  Toilet Transfer Equipment: Other (comment) (bed - chair)  Equipment Used: Gait belt;Rolling walker  Transfers/Ambulation Related to ADLs: total A transitional movements  ADL  Comments: Pt extremely lethargic today. Able to increase to alert state, but at appeared confused, had difficulty following command and reported feeling differently. Educated husband on proper positioning of RLE to prevent knee flexor contracture. Educated pt/;husband on desensitization techniques  Cognition:  Cognition  Arousal/Alertness: Lethargic  Orientation Level: Oriented to person;Oriented to place;Oriented to time;Oriented to situation  Cognition  Overall Cognitive Status: Impaired  Area of Impairment: Attention  Arousal/Alertness: Lethargic  Orientation Level: Appears intact for tasks assessed  Behavior During Session: Lethargic  Current Attention Level: Focused  Attention - Other Comments: internally distracted  Safety/Judgement: Decreased awareness of safety precautions;Decreased safety judgement for tasks assessed;Decreased awareness of need for assistance  Safety/Judgement - Other Comments: mod vc functional basic tasks  Cognition - Other Comments: Demonstrating perseveration today motorically and verbally, ataxic type movements.  Blood pressure 117/72, pulse 89, temperature 97.4 F (36.3 C), temperature source Oral, resp. rate 16, height 5' 4.5" (1.638 m), weight 72.3 kg (159 lb 6.3 oz), SpO2 98.00%.  Physical Exam  Nursing note and vitals reviewed.  Constitutional: She is oriented to person, place, and time. She appears well-developed and well-nourished. She appears lethargic. She is easily aroused.  HENT:  Head: Normocephalic and atraumatic.  Eyes: Pupils are equal, round, and reactive to light.  Neck: Normal range of motion.  Cardiovascular: Normal rate and regular rhythm. No murmurs Pulmonary/Chest: Effort normal. She has wheezes in the left upper field. She has no rales. No distress Abdominal: Soft. Bowel sounds are normal.  Musculoskeletal: She exhibits tenderness.  R-BKA with compressive post op dressing. L-foot with dry skin and well healed old 2nd toe amputation  site.  Neurological: She is oriented to person, place, and time and easily aroused. She appears lethargic.  Lethargic but arouses and answers appropriately--husband reports this is baseline for the past month. Follow basic commands without difficulty. Muscle noted wasting LLE distally. Follows full commands. UE's 4-5/5. Left Lower ext 4/5. RLE is tender and she's unable to flex hip at this point due to pain. Mild distal sensory loss in the left foot  Skin: Skin is warm and dry.   Results for orders placed during the hospital encounter of 12/12/11 (from the past 48 hour(s))   GLUCOSE, CAPILLARY Status: Abnormal    Collection Time    12/13/11 4:47 PM   Component  Value  Range  Comment    Glucose-Capillary  117 (*)  70 - 99 mg/dL    GLUCOSE, CAPILLARY Status: Abnormal    Collection Time    12/13/11 8:34 PM   Component  Value  Range  Comment    Glucose-Capillary  105 (*)  70 - 99 mg/dL    PROTIME-INR Status: Abnormal    Collection Time    12/14/11 6:58 AM   Component  Value  Range  Comment    Prothrombin Time  26.8 (*)  11.6 - 15.2 seconds     INR  2.63 (*)  0.00 - 1.49    RENAL FUNCTION PANEL Status: Abnormal    Collection Time    12/14/11 6:59 AM   Component  Value  Range  Comment    Sodium  135  135 - 145 mEq/L     Potassium  4.1  3.5 - 5.1 mEq/L     Chloride  93 (*)  96 - 112 mEq/L     CO2  27  19 - 32 mEq/L     Glucose, Bld  105 (*)  70 - 99 mg/dL     BUN  29 (*)  6 - 23 mg/dL     Creatinine, Ser  1.61 (*)  0.50 - 1.10 mg/dL     Calcium  9.5  8.4 - 10.5 mg/dL     Phosphorus  6.1 (*)  2.3 - 4.6 mg/dL     Albumin  2.5 (*)  3.5 - 5.2 g/dL     GFR calc non Af Amer  7 (*)  >90 mL/min     GFR calc Af Amer  8 (*)  >90 mL/min    CBC Status: Abnormal    Collection Time    12/14/11 7:39 AM   Component  Value  Range  Comment    WBC  11.6 (*)  4.0 - 10.5 K/uL     RBC  3.58 (*)  3.87 - 5.11 MIL/uL     Hemoglobin  9.1 (*)  12.0 - 15.0 g/dL     HCT  09.6 (*)  04.5 - 46.0 %     MCV   80.7  78.0 - 100.0 fL     MCH  25.4 (*)  26.0 - 34.0 pg     MCHC  31.5  30.0 - 36.0 g/dL     RDW  40.9 (*)  81.1 - 15.5 %     Platelets  325  150 - 400 K/uL    GLUCOSE, CAPILLARY Status: Abnormal    Collection Time    12/14/11 11:13 AM   Component  Value  Range  Comment    Glucose-Capillary  140 (*)  70 - 99 mg/dL    GLUCOSE, CAPILLARY Status: Abnormal    Collection Time    12/14/11 5:12 PM   Component  Value  Range  Comment    Glucose-Capillary  155 (*)  70 - 99 mg/dL     Comment 1  Notify RN      Comment 2  Documented in Chart     GLUCOSE, CAPILLARY Status: Abnormal    Collection Time    12/14/11 8:55 PM   Component  Value  Range  Comment    Glucose-Capillary  177 (*)  70 - 99 mg/dL    PROTIME-INR Status: Abnormal    Collection Time    12/15/11 6:15 AM   Component  Value  Range  Comment    Prothrombin Time  37.4 (*)  11.6 - 15.2 seconds  INR  4.12 (*)  0.00 - 1.49    GLUCOSE, CAPILLARY Status: Abnormal    Collection Time    12/15/11 7:53 AM   Component  Value  Range  Comment    Glucose-Capillary  282 (*)  70 - 99 mg/dL     Comment 1  Notify RN      Comment 2  Documented in Chart     GLUCOSE, CAPILLARY Status: Abnormal    Collection Time    12/15/11 11:45 AM   Component  Value  Range  Comment    Glucose-Capillary  262 (*)  70 - 99 mg/dL     No results found.  Post Admission Physician Evaluation:  1. Functional deficits secondary to right bka. 2. Patient is admitted to receive collaborative, interdisciplinary care between the physiatrist, rehab nursing staff, and therapy team. 3. Patient's level of medical complexity and substantial therapy needs in context of that medical necessity cannot be provided at a lesser intensity of care such as a SNF. 4. Patient has experienced substantial functional loss from his/her baseline which was documented above under the "Functional History" and "Functional Status" headings. Judging by the patient's diagnosis, physical exam, and  functional history, the patient has potential for functional progress which will result in measurable gains while on inpatient rehab. These gains will be of substantial and practical use upon discharge in facilitating mobility and self-care at the household level. 5. Physiatrist will provide 24 hour management of medical needs as well as oversight of the therapy plan/treatment and provide guidance as appropriate regarding the interaction of the two. 6. 24 hour rehab nursing will assist with bladder management, bowel management, safety, skin/wound care, disease management, medication administration, pain management and patient education and help integrate therapy concepts, techniques,education, etc. 7. PT will assess and treat for: fxnl mobility, strength, pain mgt, adaptive ed, pre-prosthetic ed. Goals are: mod i to min assist. 8. OT will assess and treat for: fxnl mobility, adl's, safety, adaptive equipment, . Goals are: mod i to min assist. 9. SLP will assess and treat for: n/a. Goals are: n/a. 10. Case Management and Social Worker will assess and treat for psychological issues and discharge planning. 11. Team conference will be held weekly to assess progress toward goals and to determine barriers to discharge. 12. Patient will receive at least 3 hours of therapy per day at least 5 days per week. 13. ELOS: 7-10 days Prognosis: excellent Medical Problem List and Plan:  1. DVT Prophylaxis/Anticoagulation: Pharmaceutical: Coumadin  2. Pain Management: monitor on prn Medication for now.  3. Mood: difficult to judge. Will monitor for now and have LCSW follow for evaluation.  4. Neuropsych: This patient is capable of making decisions on his/her own behalf.  5. 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 SSI for elevated BS.  6. ESRD: HD M, W, F. Daily weights. Renal diet.  7. Low grade fevers: will check CXR. Husband reports congested cough for a few weeks.  12/15/2011 Ivory Broad, MD

## 2011-12-15 NOTE — Progress Notes (Signed)
ANTICOAGULATION CONSULT NOTE - Follow Up Consult  Pharmacy Consult for Coumadin Indication: VTE prophylaxis  Allergies  Allergen Reactions  . Metoclopramide Hcl Other (See Comments)    Tardive dyskinesia  . Codeine Nausea And Vomiting  . Lipitor (Atorvastatin Calcium) Other (See Comments)    Muscle weakness    Patient Measurements: Height: 5' 4.5" (163.8 cm) Weight: 159 lb 6.3 oz (72.3 kg) IBW/kg (Calculated) : 55.85  Heparin Dosing Weight:   Vital Signs: Temp: 99.7 F (37.6 C) (10/24 0607) Temp src: Oral (10/24 0607) BP: 123/59 mmHg (10/24 0607) Pulse Rate: 100  (10/24 0607)  Labs:  Alvira Philips 12/15/11 0615 12/14/11 0739 12/14/11 0659 12/14/11 0658 12/13/11 1227 12/13/11 0620 12/12/11 1414  HGB -- 9.1* -- -- 9.3* -- --  HCT -- 28.9* -- -- 29.4* -- 33.2*  PLT -- 325 -- -- 303 -- 344  APTT -- -- -- -- -- -- 51*  LABPROT 37.4* -- -- 26.8* -- 18.3* --  INR 4.12* -- -- 2.63* -- 1.57* --  HEPARINUNFRC -- -- -- -- -- -- --  CREATININE -- -- 5.98* -- 4.62* -- 3.09*  CKTOTAL -- -- -- -- -- -- --  CKMB -- -- -- -- -- -- --  TROPONINI -- -- -- -- -- -- --    Estimated Creatinine Clearance: 10.2 ml/min (by C-G formula based on Cr of 5.98).   Medications:    Assessment: 56yof on Coumadin for VTE prophylaxis s/p BKA. INR (4.12) is now supratherapeutic despite held dose last PM and only Coumadin 5mg  x 2 on 10/21 and 10/22. Will continue to hold Coumadin and follow-up AM INR. - No CBC this AM - No significant bleeding reported  Goal of Therapy:  INR 2-3   Plan:  1. No Coumadin today 2. Follow-up AM INR 3. Monitor for s/sx of bleeding  Cleon Dew [phone] 12/15/2011,10:40 AM

## 2011-12-15 NOTE — Progress Notes (Signed)
Patient ID: Faith [name], female   DOB: [birth year], 56 y.o.   MRN: [id number] Patient with increased lethargy yesterday secondary to narcotics these were discontinued. I called the rehabilitation and patient coordinator for inpatient consult for possible admission to inpatient rehabilitation.

## 2011-12-15 NOTE — Progress Notes (Signed)
PT Cancellation Note  Patient Details Name: Faith [name] MRN: [id number] DOB: 1955-12-19   Cancelled Treatment:    Reason Eval/Treat Not Completed: Other (comment) (pt currently transferring to CIR will defer to CIR)   Delaney Meigs, PT [phone]

## 2011-12-15 NOTE — Progress Notes (Signed)
Patient admitted to 4036 at 1530. Lethargic but arousable and oriented to self, place, time, and situation. No complaints of pain to R AKA "as long as nobody moves it". L side weakness r/t CVA residual. RUE AVF positve for thrill and bruit, dressing C.D.I. Surgical dressing intact to R AKA with knee immobilizer in place. Husband and patient oriented to unit, rehab schedule, safety plan, and call bell system. Husband and patient verbalized understanding. Hedy Camara

## 2011-12-15 NOTE — Progress Notes (Signed)
Rehab admissions - Evaluated for possible admission.  I spoke with patient and she is agreeable to inpatient rehab admission.  She has [facility] services.  Bed available and can admit to inpatient rehab today.  Call me for questions.  #[phone]

## 2011-12-16 ENCOUNTER — Inpatient Hospital Stay (HOSPITAL_COMMUNITY): Payer: Medicare (Managed Care) | Admitting: Physical Therapy

## 2011-12-16 ENCOUNTER — Inpatient Hospital Stay (HOSPITAL_COMMUNITY): Payer: Medicare (Managed Care)

## 2011-12-16 DIAGNOSIS — G8110 Spastic hemiplegia affecting unspecified side: Secondary | ICD-10-CM

## 2011-12-16 DIAGNOSIS — I70269 Atherosclerosis of native arteries of extremities with gangrene, unspecified extremity: Secondary | ICD-10-CM

## 2011-12-16 DIAGNOSIS — S88119A Complete traumatic amputation at level between knee and ankle, unspecified lower leg, initial encounter: Secondary | ICD-10-CM

## 2011-12-16 LAB — GLUCOSE, CAPILLARY
Glucose-Capillary: 91 mg/dL (ref 70–99)
Glucose-Capillary: 156 mg/dL — ABNORMAL HIGH (ref 70–99)

## 2011-12-16 LAB — CBC
HCT: 28.8 % — ABNORMAL LOW (ref 36.0–46.0)
MCHC: 30.9 g/dL (ref 30.0–36.0)
MCV: 81.1 fL (ref 78.0–100.0)
Platelets: 313 10*3/uL (ref 150–400)
RDW: 16.9 % — ABNORMAL HIGH (ref 11.5–15.5)
WBC: 13.2 10*3/uL — ABNORMAL HIGH (ref 4.0–10.5)

## 2011-12-16 LAB — RENAL FUNCTION PANEL
Albumin: 2.4 g/dL — ABNORMAL LOW (ref 3.5–5.2)
BUN: 28 mg/dL — ABNORMAL HIGH (ref 6–23)
Creatinine, Ser: 6.06 mg/dL — ABNORMAL HIGH (ref 0.50–1.10)
GFR calc non Af Amer: 7 mL/min — ABNORMAL LOW (ref >90)
Phosphorus: 4.9 mg/dL — ABNORMAL HIGH (ref 2.3–4.6)

## 2011-12-16 LAB — PROTIME-INR: Prothrombin Time: 38.1 seconds — ABNORMAL HIGH (ref 11.6–15.2)

## 2011-12-16 MED ORDER — PARICALCITOL 5 MCG/ML IV SOLN
INTRAVENOUS | Status: AC
  Filled 2011-12-16: qty 2

## 2011-12-16 MED ORDER — PARICALCITOL 5 MCG/ML IV SOLN
8.0000 ug | INTRAVENOUS | Status: DC
  Administered 2011-12-16 – 2011-12-30 (×6): 8 ug via INTRAVENOUS
  Filled 2011-12-16 (×7): qty 1.6

## 2011-12-16 NOTE — Progress Notes (Signed)
Patient information reviewed and entered into eRehab system by Tora Duck, RN, CRRN, PPS Coordinator.  Information including medical coding and functional independence measure will be reviewed and updated through discharge.   Admitting staff unaware that P[facility] is a Medicare product.  I gave patient and husband the "Data Collection Information Summary for Patients in Inpatient Rehabilitation Facilities with attached "Privacy Act Statement-Health Care Records" and explained privacy/patient rights this morning.  Patient and husband receptive and expressed understanding.

## 2011-12-16 NOTE — Evaluation (Signed)
Physical Therapy Assessment and Plan  Patient Details  Name: Faith [name] MRN: [id number] Date of Birth: 1955-06-25  PT Diagnosis: , Difficulty walking, Hemiparesis non-dominant, Muscle weakness, Contracture of joint: L ankle and Pain in RLE Rehab Potential: Good ELOS: 2.5 weeks   Today's Date: 12/16/2011 Time: 0950-1100 Time Calculation (min): 70 min  Problem List:  Patient Active Problem List  Diagnosis  . ESRD (end stage renal disease) on dialysis  . Diabetes mellitus type II  . Secondary hyperparathyroidism  . CAD (coronary artery disease) of artery bypass graft  . PAD (peripheral artery disease)  . Chronic constipation  . GERD (gastroesophageal reflux disease)  . Anemia of chronic renal failure  . Tardive dyskinesia  . CVA (cerebral infarction)  . Depression  . Neuropathy  . Diabetic retinopathy associated with type 2 diabetes mellitus  . Diastolic CHF, chronic  . History of Osteomyelitis of toe of left foot  . End stage renal disease  . Osteomyelitis of toe of right foot    Past Medical History:  Past Medical History  Diagnosis Date  . Hyperparathyroidism, secondary renal   . CAD (coronary artery disease) of artery bypass graft     CABG in 2002  . PAD (peripheral artery disease)     Not a candidate for revascularization  . Chronic constipation   . GERD (gastroesophageal reflux disease)   . Anemia of chronic renal failure   . Tardive dyskinesia     Secondary to Reglan  . Depression   . Neuropathy   . Diabetic retinopathy(362.0)   . Diastolic CHF, chronic     Grade 2  . Pulmonary embolism 06/2003  . Hyperlipidemia   . Myocardial infarction 2002  . Stroke 2002    "w/left side" residual weakness  . Dialysis patient 01/17/11    [name]; M, W, F; via right upper arm AVF  . Diabetes mellitus     type II  . CHF (congestive heart failure)   . CVA (cerebral infarction)     right brain with minimal left hemiparesis  . Pneumonia 10/2009; 11/2009   ?pneumonia 01/17/11  . Hypertension   . Arthritis   . Complication of anesthesia     "sleepy afterwards; due to her being dialysis pt"  . ESRD (end stage renal disease) on dialysis     M-W-Fr  [name]   Past Surgical History:  Past Surgical History  Procedure Date  . Coronary artery bypass graft 2002  . Laparoscopic cholecystectomy 2002  . Av fistula placement     times 4-using rt upper arm graft-old rt lower arm,lt upper and lower old grafts  . Trigger finger release   . Tubal ligation   . Eye examination under anesthesia w/ retinal cryotherapy and retinal laser   . Cataract extraction 07/2010    right sided   . Toe amputation 5/12    left 2nd toe secondary to osteomyelitis  . Breast biopsy 06/2010  . Carpal tunnel release 05/05/2011    Procedure: CARPAL TUNNEL RELEASE;  Surgeon: Sharma Covert, MD;  Location: [facility];  Service: Orthopedics;  Laterality: Right;  . Coronary angioplasty with stent placement   . Amputation 11/24/2011    Procedure: AMPUTATION RAY;  Surgeon: Nadara Mustard, MD;  Location: [facility] OR;  Service: Orthopedics;  Laterality: Right;  Right Foot 2nd Ray Amputation  . Amputation 12/12/2011    Procedure: AMPUTATION BELOW KNEE;  Surgeon: Nadara Mustard, MD;  Location: [facility] OR;  Service: Orthopedics;  Laterality: Right;    Assessment & Plan Clinical Impression:   Patient is a 56 year old woman with diabetes with diabetic nephropathy and retinopathy, right foot abscess with osteomyelitis and second ray amputation who developed progressive gangrenous changes of her forefoot with fever and pain. 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 Lajoyce Corners. Post op started on coumadin for VTE prophylaxis. KI ordered to help keep knee in extension. She did have increased lethargy with confusion yesterday that has resolved.  Patient transferred to CIR on 12/15/2011 .   Patient currently requires total assist with mobility secondary to muscle weakness and muscle joint tightness,  impaired timing and sequencing, unbalanced muscle activation, decreased coordination and decreased motor planning and decreased initiation, decreased awareness, decreased problem solving, decreased safety awareness, decreased memory and delayed processing.  Prior to hospitalization, patient was modified independent with mobility and lived with Spouse in a House home.  Home access is a ramp.  Patient will benefit from skilled PT intervention to maximize safe functional mobility, minimize fall risk and decrease caregiver burden for planned discharge home with 24 hour assist.  Anticipate patient will benefit from follow up [facility] at discharge.  Pt will need a custom manual w/c to accomodate her L contracted hand, R BKA, decreased activity tolerance, and a w/c cushion to improve sitting posture, offer kin protection and comfort.  Pt is unlikely to be an ambulator.    PT - End of Session Activity Tolerance: Tolerates 10 - 20 min activity with multiple rests Endurance Deficit: Yes PT Assessment Rehab Potential: Good Barriers to Discharge: Decreased caregiver support Barriers to Discharge Comments: pt home alone intermittently each day while husband at work PT Plan PT Frequency: 1-2 X/day, 60-90 minutes;5 out of 7 days Estimated Length of Stay: 2.5 weeks PT Treatment/Interventions: Ambulation/gait training;Balance/vestibular training;Discharge planning;DME/adaptive equipment instruction;Functional mobility training;Neuromuscular re-education;Patient/family education;Splinting/orthotics;Therapeutic Activities;Therapeutic Exercise;UE/LE Coordination activities;UE/LE Strength taining/ROM;Wheelchair propulsion/positioning;Functional electrical stimulation PT Recommendation Follow Up Recommendations: Home health PT Equipment Recommended: Wheelchair (measurements);Wheelchair cushion (measurements) Equipment Details: present manual w/c and cushion showing wear  PT  Evaluation Precautions/Restrictions Precautions Precautions: Fall Precaution Comments: s/p CVA with L hemiparesis (10 years ago) Required Braces or Orthoses: Knee Immobilizer - Right Knee Immobilizer - Right: On except when in CPM Restrictions Weight Bearing Restrictions: Yes RLE Weight Bearing: Non weight bearing  Vital Signs  Pt on 2 L O2 via [place]   Pain Pain Assessment Pain Assessment: 0-10 Pain Score:   5 Pain Type: Surgical pain Pain Location: Leg Pain Orientation: Right Pain Descriptors: Aching Pain Frequency: Constant Pain Intervention(s): Medication (See eMAR);RN made aware Home Living/Prior Functioning Home Living Lives With: Spouse Available Help at Discharge:  (See treatment team sticky note) Type of Home: House Home Access: Level entry Home Layout: One level Bathroom Shower/Tub: Tub/shower unit;Curtain Firefighter: Standard Bathroom Accessibility: Yes How Accessible: Accessible via walker Home Adaptive Equipment:  (manual w/c armrests worn; needs amputee support) Additional Comments: has RW and 4WW; used RW and manual w/c indoors; rarely power w/c in community with family Prior Function Level of Independence: Needs assistance with ADLs Bath: Minimal Dressing: Moderate Able to Take Stairs?: No Driving: No Vocation: On disability Comments: uses wheelchair for community access; used RW to walk in home prior to BKA; PACE program tues and thurs (they transport her in a [place]), HD MWF; pt was previously alone for 2-4 hours each day when husband was working and aides not scheduled  Vision/Perception  Vision - History Baseline Vision: Wears glasses only for reading Patient Visual Report: No  change from baseline Vision - Assessment Eye Alignment: Within Functional Limits  Cognition Overall Cognitive Status: Impaired Arousal/Alertness: Awake/alert Orientation Level: Oriented X4 Memory: Impaired Awareness: Impaired Problem Solving:  Impaired Sensation Sensation Light Touch: Impaired Detail (absent toes L) Proprioception: impaired L knee, ankle and toes; some kinesthesia, but not proprioception Motor  Motor Motor: Hemiplegia Motor - Skilled Clinical Observations: LLE hemiparesis  Mobility Bed Mobility Bed Mobility: Supine to Sit Rolling Right: 3: Mod assist Rolling Right Details: Verbal cues for sequencing;Verbal cues for technique Rolling Left: 3: Mod assist Rolling Left Details: Verbal cues for sequencing;Verbal cues for technique Supine to Sit: 2: Max assist Supine to Sit Details: Verbal cues for technique;Verbal cues for sequencing Sitting - Scoot to Edge of Bed: 3: Mod assist Transfers Sit to Stand: 1: +2 Total assist Stand to Sit: 1: +2 Total assist Stand Pivot Transfers: 1: +2 Total assist Locomotion  Ambulation Ambulation: No Gait Gait: No Stairs / Additional Locomotion Stairs: No Wheelchair Mobility Wheelchair Mobility: Yes Wheelchair Assistance: 4: Administrator, sports Details: Manual facilitation for placement Wheelchair Propulsion: Both upper extremities Wheelchair Parts Management: Needs assistance Distance: 25  Trunk/Postural Assessment  Cervical Assessment Cervical Assessment: Within Functional Limits Thoracic Assessment Thoracic Assessment: Within Functional Limits Lumbar Assessment Lumbar Assessment: Within Functional Limits Postural Control Postural Control: Within Functional Limits (sits in posterior pelvic tilt)  Balance Balance Balance Assessed: Yes Static Sitting Balance Static Sitting - Balance Support: Feet supported Static Sitting - Level of Assistance: 5: Stand by assistance Dynamic Sitting Balance Dynamic Sitting - Balance Support: Feet supported (LLE only) Dynamic Sitting - Level of Assistance: 4: Min assist Extremity Assessment  RUE Assessment RUE Assessment: Within Functional Limits (MMT: 4/5) LUE Assessment LUE Assessment: Within Functional  Limits (MMT: 4/5); contracture L hand, somewhat clawed RLE Assessment RLE Assessment: Exceptions to [facility] RLE Strength RLE Overall Strength Comments: hip flexion < 3/5; dificult to assess due to pain;  hip adduction and abduction 2-/5 grossly, in sitting; knee NT due to KI LLE Assessment LLE Assessment: Exceptions to [facility] LLE PROM (degrees) Left Ankle Dorsiflexion:  (-30 to -10; PF contracture) LLE Strength LLE Overall Strength Comments: hip flexion 2+/5; knee extension 3-/5; ankle DF 1/5; toe ext 1/5  See FIM for current functional status Refer to Care Plan for Long Term Goals  Recommendations for other services: None  Discharge Criteria: Patient will be discharged from PT if patient refuses treatment 3 consecutive times without medical reason, if treatment goals not met, if there is a change in medical status, if patient makes no progress towards goals or if patient is discharged from hospital.  The above assessment, treatment plan, treatment alternatives and goals were discussed and mutually agreed upon: by patient and by family  Treatment today:  neuromuscular re-education via demo, forced use,  tactile and VCs for: - L and R lateral leans and scooting forward/backward - attempted standing in parallel bars; pt transferred wt forward and onto LLE, but was unable to elevate hips - w/c propulsion using bil UEs x 40' with min assist, focusing on symmetrical use of bil hands; pt's contracted L hand impairs her ability to propel Kable Haywood 12/16/2011, 11:30 AM

## 2011-12-16 NOTE — Care Management Note (Signed)
Inpatient Rehabilitation Center Individual Statement of Services  Patient Name:  Faith [name]  Date:  12/16/2011  Welcome to the Inpatient Rehabilitation Center.  Our goal is to provide you with an individualized program based on your diagnosis and situation, designed to meet your specific needs.  With this comprehensive rehabilitation program, you will be expected to participate in at least 3 hours of rehabilitation therapies Monday-Friday, with modified therapy programming on the weekends.  Your rehabilitation program will include the following services:  Physical Therapy (PT), Occupational Therapy (OT), 24 hour per day rehabilitation nursing, Case Management (RN and Social Worker), Rehabilitation Medicine, Nutrition Services and Pharmacy Services  Weekly team conferences will be held on Wednesday to discuss your progress.  Your RN Case Designer, television/film set will talk with you frequently to get your input and to update you on team discussions.  Team conferences with you and your family in attendance may also be held.  Expected length of stay: 2.5-3 weeks  Overall anticipated outcome: Min-supervision level  Depending on your progress and recovery, your program may change.  Your RN Case Estate agent will coordinate services and will keep you informed of any changes.  Your RN Sports coach and SW names and contact numbers are listed  below.  The following services may also be recommended but are not provided by the Inpatient Rehabilitation Center:   Driving Evaluations  Home Health Rehabiltiation Services  Outpatient Rehabilitatation [facility]  Vocational Rehabilitation   Arrangements will be made to provide these services after discharge if needed.  Arrangements include referral to agencies that provide these services.  Your insurance has been verified to be:  Medicare & Commerical Ins Your primary doctor is:  Dr Marny Lowenstein  Pertinent information will be shared  with your doctor and your insurance company.   Social Worker:  Dossie Der, [place] [phone]  Information discussed with and copy given to patient by: Lucy Chris, 12/16/2011, 10:38 AM

## 2011-12-16 NOTE — Progress Notes (Signed)
Social Work Assessment and Plan Social Work Assessment and Plan  Patient Details  Name: Faith [name] MRN: [id number] Date of Birth: 10/18/1955  Today's Date: 12/16/2011  Problem List:  Patient Active Problem List  Diagnosis  . ESRD (end stage renal disease) on dialysis  . Diabetes mellitus type II  . Secondary hyperparathyroidism  . CAD (coronary artery disease) of artery bypass graft  . PAD (peripheral artery disease)  . Chronic constipation  . GERD (gastroesophageal reflux disease)  . Anemia of chronic renal failure  . Tardive dyskinesia  . CVA (cerebral infarction)  . Depression  . Neuropathy  . Diabetic retinopathy associated with type 2 diabetes mellitus  . Diastolic CHF, chronic  . History of Osteomyelitis of toe of left foot  . End stage renal disease  . Osteomyelitis of toe of right foot   Past Medical History:  Past Medical History  Diagnosis Date  . Hyperparathyroidism, secondary renal   . CAD (coronary artery disease) of artery bypass graft     CABG in 2002  . PAD (peripheral artery disease)     Not a candidate for revascularization  . Chronic constipation   . GERD (gastroesophageal reflux disease)   . Anemia of chronic renal failure   . Tardive dyskinesia     Secondary to Reglan  . Depression   . Neuropathy   . Diabetic retinopathy(362.0)   . Diastolic CHF, chronic     Grade 2  . Pulmonary embolism 06/2003  . Hyperlipidemia   . Myocardial infarction 2002  . Stroke 2002    "Faith [name]/left side" residual weakness  . Dialysis patient 01/17/11    [name]; [name], Faith [name], Faith [name]; via right upper arm AVF  . Diabetes mellitus     type II  . CHF (congestive heart failure)   . CVA (cerebral infarction)     right brain with minimal left hemiparesis  . Pneumonia 10/2009; 11/2009    ?pneumonia 01/17/11  . Hypertension   . Arthritis   . Complication of anesthesia     "sleepy afterwards; due to her being dialysis pt"  . ESRD (end stage renal disease) on dialysis    [name]-Faith [name]-Fr  [name]   Past Surgical History:  Past Surgical History  Procedure Date  . Coronary artery bypass graft 2002  . Laparoscopic cholecystectomy 2002  . Av fistula placement     times 4-using rt upper arm graft-old rt lower arm,lt upper and lower old grafts  . Trigger finger release   . Tubal ligation   . Eye examination under anesthesia Faith [name]/ retinal cryotherapy and retinal laser   . Cataract extraction 07/2010    right sided   . Toe amputation 5/12    left 2nd toe secondary to osteomyelitis  . Breast biopsy 06/2010  . Carpal tunnel release 05/05/2011    Procedure: CARPAL TUNNEL RELEASE;  Surgeon: Sharma Covert, MD;  Location: [facility];  Service: Orthopedics;  Laterality: Right;  . Coronary angioplasty with stent placement   . Amputation 11/24/2011    Procedure: AMPUTATION RAY;  Surgeon: Nadara Mustard, MD;  Location: [facility] OR;  Service: Orthopedics;  Laterality: Right;  Right Foot 2nd Ray Amputation  . Amputation 12/12/2011    Procedure: AMPUTATION BELOW KNEE;  Surgeon: Nadara Mustard, MD;  Location: [facility] OR;  Service: Orthopedics;  Laterality: Right;   Social History:  reports that she has never smoked. She has never used smokeless tobacco. She reports that she does not drink alcohol or  use illicit drugs.  Family / Support Systems Marital Status: Married Patient Roles: Spouse;Parent Spouse/Significant Other: Faith [name]  Faith [name] Children: Son in [place] Other Supports: Faith [name]  [name] Anticipated Caregiver: Has caregiver and also attends PACE program.  Husband does assist when he is home and sister comes in and out Ability/Limitations of Caregiver: Someone will be there with pt husband reports, until she is doing more on her own. Caregiver Availability: 24/7 Family Dynamics: Close knit family.  Husband very committed to pt and making sure her care needs are met.  Her sister also assists her when necessary.  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   Abuse/Neglect Physical Abuse: Denies Verbal Abuse: Denies Sexual Abuse: Denies Exploitation of patient/patient's resources: Denies Self-Neglect: Denies  Emotional Status Pt's affect, behavior adn adjustment status: Pt is motivated to improve and become as independent as possible.  She reports: " It is always something with me."  She is relieved the surgery is over and now the recovery can begin. Recent Psychosocial Issues: other medical sisues Pyschiatric History: No history-deferred depression screen due to pt tired and just finished therapies.  She and her husband feel she is doing fairly well with what has happened. Substance Abuse History: No issues  Patient / Family Perceptions, Expectations & Goals Pt/Family understanding of illness & functional limitations: Pt and husband can explain her amputation and limitations.  He is here to observe therpaies and wants to see how she is doing for her first day.  Aware it may take time with her other deficits and both will be patient. Premorbid pt/family roles/activities: Wife, Mother, Retiree, Dialysis Pt, Church member, etc Anticipated changes in roles/activities/participation: Resume Pt/family expectations/goals: Pt states: " I want to get as good as I can get while here."  " I would like to do somethings for myself."  Husband reports: " I am hopeful she will make some progress while here and start her recovery."  [employer]: Other (Comment) (Pace-participant) Premorbid Home Care/DME Agencies: Other (Comment) ([facility]- Faith [name], Faith [name],  Faith [name]) Transportation available at discharge: Husband and [employer] referrals recommended: Support group (specify) (Amputee Support group)  Discharge Planning Living Arrangements: Spouse/significant other Support Systems: Spouse/significant other;Children;Other relatives;Friends/neighbors;Church/faith community;Other (Comment) Tour manager members) Type of Residence: Private residence Insurance Resources: [employer] (specify) (Commerical Ins) Financial Resources: Family Careers information officer Screen Referred: No Living Expenses: Lives with family Money Management: Spouse Do you have any problems obtaining your medications?: No Home Management: Husband and caregivers Patient/Family Preliminary Plans: Return home and back to the [place] program with her routine.  She has good support systems and good care. Social Work Anticipated Follow Up Needs: HH/OP;Support Group;Other (comment) ([facility] Program)  Clinical Impression Pleasant couple who are committed to one another.  Husband will make sure she has the care she needs.  They have a very good plan in place already. Coordinate services once discharge date targeted.  Lucy Chris 12/16/2011, 11:22 AM

## 2011-12-16 NOTE — Progress Notes (Signed)
Patient ID: Faith [name], female   DOB: 1955/10/07, 56 y.o.   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 Lajoyce Corners. Post op started on coumadin for VTE prophylaxis. KI ordered to help keep knee in extension  Subjective/Complaints: Slept ok except didn't like mattress   Objective: Vital Signs: Blood pressure 115/69, pulse 81, temperature 98.5 F (36.9 C), temperature source Oral, resp. rate 19, weight 76.1 kg (167 lb 12.3 oz), SpO2 92.00%. Dg Chest 2 View  12/15/2011  *RADIOLOGY REPORT*  Clinical Data: 56 year old female cough and fever. End-stage renal disease.  CHEST - 2 VIEW  Comparison: 12/05/2011 and earlier.  Findings: Seated AP and lateral views of the chest. Stable cardiomegaly and mediastinal contours.  Stable sequelae of CABG. No pneumothorax or pulmonary edema.  Pulmonary vascularity is decreased compared to 11/24/2011. Chronic prominence of central/hilar vasculature is stable.  No pleural effusion or consolidation.  No confluent pulmonary opacity identified. No acute osseous abnormality identified.  IMPRESSION: Stable cardiomegaly.  Decreased vascular congestion from earlier in the month.  No focal pneumonia identified.   Original Report Authenticated By: Harley Hallmark, M.D.    Results for orders placed during the hospital encounter of 12/15/11 (from the past 72 hour(s))  GLUCOSE, CAPILLARY     Status: Abnormal   Collection Time   12/15/11  4:45 PM      Component Value Range Comment   Glucose-Capillary 130 (*) 70 - 99 mg/dL   GLUCOSE, CAPILLARY     Status: Normal   Collection Time   12/15/11  9:38 PM      Component Value Range Comment   Glucose-Capillary 85  70 - 99 mg/dL   PROTIME-INR     Status: Abnormal   Collection Time   12/16/11   6:12 AM      Component Value Range Comment   Prothrombin Time 38.1 (*) 11.6 - 15.2 seconds    INR 4.23 (*) 0.00 - 1.49   GLUCOSE, CAPILLARY     Status: Normal   Collection Time   12/16/11  7:23 AM      Component Value Range Comment   Glucose-Capillary 91  70 - 99 mg/dL      HEENT: normal Cardio: RRR Resp: Rales GI: BS positive and Distention Extremity:  No Edema Skin:   Other R BKA with dressing and immobilizer in PT Neuro: Alert/Oriented, Abnormal Sensory absent proprioception, absent lt touch L foot, Abnormal Motor 2-/5 R ankle DF/PF, Dysarthric and Other tardive dyskinesia lip smacking Musc/Skel:  Other R BKA Gen: NAD   Assessment/Plan: 1. Functional deficits secondary to R BKA, DM with neuropathy which require 3+ hours per day of interdisciplinary therapy in a comprehensive inpatient rehab setting. Physiatrist is providing close team supervision and 24 hour management of active medical problems listed below. Physiatrist and rehab team continue to assess barriers to discharge/monitor patient progress toward functional and medical goals. FIM: FIM - Bathing Bathing Steps Patient Completed: Chest;Right Arm;Left Arm;Abdomen;Front perineal area;Buttocks;Right upper leg;Left upper leg;Left lower leg (including foot) Bathing: 2: Max-Patient completes 3-4 71f 10 parts or 25-49% (LB supine in bed. UB sitting EOB.)  FIM - Upper Body Dressing/Undressing Upper body dressing/undressing steps patient completed: Thread/unthread right sleeve of pullover shirt/dresss;Thread/unthread left sleeve of pullover shirt/dress;Put head through opening of pull over shirt/dress;Pull shirt over trunk  Upper body dressing/undressing: 4: Min-Patient completed 75 plus % of tasks FIM - Lower Body Dressing/Undressing Lower body dressing/undressing steps patient completed: Thread/unthread right underwear leg;Thread/unthread left underwear leg;Pull underwear up/down;Thread/unthread right pants leg;Thread/unthread  left pants leg;Pull pants up/down;Don/Doff left sock;Don/Doff left shoe;Fasten/unfasten left shoe Lower body dressing/undressing: 1: Total-Patient completed less than 25% of tasks (performed in bed.)  FIM - Toileting Toileting steps completed by patient: Adjust clothing prior to toileting;Performs perineal hygiene;Adjust clothing after toileting Toileting: 1: Total-Patient completed zero steps, helper did all 3  FIM - Toilet Transfers Toilet Transfers: 1-Two helpers  FIM - Banker Devices: Bed rails;Arm rests Bed/Chair Transfer: 2: Sit > Supine: Max A (lifting assist/Pt. 25-49%);1: Two helpers     Comprehension Comprehension Mode: Auditory Comprehension: 5-Follows basic conversation/direction: With no assist  Expression Expression Mode: Verbal Expression: 5-Expresses basic needs/ideas: With no assist  Social Interaction Social Interaction: 5-Interacts appropriately 90% of the time - Needs monitoring or encouragement for participation or interaction.  Problem Solving Problem Solving: 5-Solves basic problems: With no assist  Memory Memory: 6-More than reasonable amt of time  Medical Problem List and Plan:  1. DVT Prophylaxis/Anticoagulation: Pharmaceutical: Coumadin  2. Pain Management: monitor on prn Medication for now.  3. Mood: difficult to judge. Will monitor for now and have LCSW follow for evaluation.  4. Neuropsych: This patient is capable of making decisions on his/her own behalf.  5. 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 SSI for elevated BS.  6. ESRD: HD M, W, F. Daily weights. Renal diet.  7. Low grade fevers: will check CXR. Husband reports congested cough for a few weeks.     LOS (Days) 1 A FACE TO FACE EVALUATION WAS PERFORMED  Noura Purpura E 12/16/2011, 10:04 AM

## 2011-12-16 NOTE — Evaluation (Addendum)
Occupational Therapy Assessment and Plan  Patient Details  Name: Faith [name] MRN: [id number] Date of Birth: 1955/11/17  OT Diagnosis: muscle weakness (generalized) Rehab Potential: Rehab Potential: Good ELOS: 2.5 weeks   Today's Date: 12/16/2011 Time:815-915  60 mins  Problem List:  Patient Active Problem List  Diagnosis  . ESRD (end stage renal disease) on dialysis  . Diabetes mellitus type II  . Secondary hyperparathyroidism  . CAD (coronary artery disease) of artery bypass graft  . PAD (peripheral artery disease)  . Chronic constipation  . GERD (gastroesophageal reflux disease)  . Anemia of chronic renal failure  . Tardive dyskinesia  . CVA (cerebral infarction)  . Depression  . Neuropathy  . Diabetic retinopathy associated with type 2 diabetes mellitus  . Diastolic CHF, chronic  . History of Osteomyelitis of toe of left foot  . End stage renal disease  . Osteomyelitis of toe of right foot    Past Medical History:  Past Medical History  Diagnosis Date  . Hyperparathyroidism, secondary renal   . CAD (coronary artery disease) of artery bypass graft     CABG in 2002  . PAD (peripheral artery disease)     Not a candidate for revascularization  . Chronic constipation   . GERD (gastroesophageal reflux disease)   . Anemia of chronic renal failure   . Tardive dyskinesia     Secondary to Reglan  . Depression   . Neuropathy   . Diabetic retinopathy(362.0)   . Diastolic CHF, chronic     Grade 2  . Pulmonary embolism 06/2003  . Hyperlipidemia   . Myocardial infarction 2002  . Stroke 2002    "w/left side" residual weakness  . Dialysis patient 01/17/11    Faith [name]; M, W, F; via right upper arm AVF  . Diabetes mellitus     type II  . CHF (congestive heart failure)   . CVA (cerebral infarction)     right brain with minimal left hemiparesis  . Pneumonia 10/2009; 11/2009    ?pneumonia 01/17/11  . Hypertension   . Arthritis   . Complication of anesthesia       "sleepy afterwards; due to her being dialysis pt"  . ESRD (end stage renal disease) on dialysis     M-W-Fr  Faith [name]   Past Surgical History:  Past Surgical History  Procedure Date  . Coronary artery bypass graft 2002  . Laparoscopic cholecystectomy 2002  . Av fistula placement     times 4-using rt upper arm graft-old rt lower arm,lt upper and lower old grafts  . Trigger finger release   . Tubal ligation   . Eye examination under anesthesia w/ retinal cryotherapy and retinal laser   . Cataract extraction 07/2010    right sided   . Toe amputation 5/12    left 2nd toe secondary to osteomyelitis  . Breast biopsy 06/2010  . Carpal tunnel release 05/05/2011    Procedure: CARPAL TUNNEL RELEASE;  Surgeon: Faith Covert, MD;  Location: [facility];  Service: Orthopedics;  Laterality: Right;  . Coronary angioplasty with stent placement   . Amputation 11/24/2011    Procedure: AMPUTATION RAY;  Surgeon: Faith Mustard, MD;  [facility];  Service: Orthopedics;  Laterality: Right;  Right Foot 2nd Ray Amputation  . Amputation 12/12/2011    Procedure: AMPUTATION BELOW KNEE;  Surgeon: Faith Mustard, MD;  Location: [facility] OR;  Service: Orthopedics;  Laterality: Right;    Assessment & Plan Clinical Impression:  Patient is a 56 y.o. year old female with recent admission to the hospital with diabetes with diabetic nephropathy and retinopathy, right foot abscess with osteomyelitis and second ray amputation who developed progressive gangrenous changes of her forefoot with fever and pain. 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 Lajoyce [name]. Post op started on coumadin for VTE prophylaxis. KI ordered to help keep knee in extension. She did have increased lethargy with confusion yesterday that has resolved. Patient transferred to CIR on 12/15/2011 .    Patient currently requires Supervision for grooming, Min Assist for UB dressing, Max Assist for bathing, Total Assist for LB dressing. with basic  self-care skills secondary to muscle weakness.  Prior to hospitalization, patient could complete BADL with Min - Mod Assist.  Patient will benefit from skilled intervention to decrease level of assist with basic self-care skills prior to discharge home with care partner.  Anticipate patient will require 24 hour supervision and minimal physical assistance and follow up home health.  OT - End of Session Endurance Deficit: Yes OT Assessment Rehab Potential: Good Barriers to Discharge: None OT Plan OT Frequency: 1-2 X/day, 60-90 minutes Estimated Length of Stay: 2.5-3 weeks OT Treatment/Interventions: Balance/vestibular training;Community reintegration;DME/adaptive equipment instruction;Functional mobility training;Discharge planning;Pain management;Patient/family education;Self Care/advanced ADL retraining;Therapeutic Activities;Therapeutic Exercise;UE/LE Strength taining/ROM;UE/LE Coordination activities OT Recommendation Follow Up Recommendations: Home health OT;24 hour supervision/assistance Equipment Recommended: None recommended by OT  OT Evaluation Precautions/Restrictions  Precautions Precautions: Fall Precaution Comments: s/p CVA with L hemiparesis (10 years ago) Required Braces or Orthoses: Knee Immobilizer - Right Knee Immobilizer - Right: On except when in CPM Pain Pain Assessment Pain Assessment: 0-10 Pain Score:   8 Pain Type: Surgical pain Pain Location: Leg Pain Orientation: Right Pain Descriptors: Aching Pain Frequency: Constant Pain Intervention(s): RN made aware Home Living/Prior Functioning Home Living Lives With: Spouse Available Help at Discharge:  (See treatment team sticky note) Type of Home: House Home Access: Level entry Home Layout: One level Bathroom Shower/Tub: Tub/shower unit;Curtain Firefighter: Standard Bathroom Accessibility: Yes How Accessible: Accessible via walker Home Adaptive Equipment: Bedside commode/3-in-1;Grab bars in  shower;Wheelchair - manual;Tub transfer bench;Walker - rolling Prior Function Level of Independence: Needs assistance with ADLs Bath: Minimal Dressing: Moderate Able to Take Stairs?: No Driving: No Vocation: On disability Comments: uses wheelchair for community access; used RW to walk in home prior to BKA; [facility] program tues and thurs (they transport her in a [employer]), HD MWF  Vision/Perception  Vision - History Baseline Vision: Wears glasses only for reading Patient Visual Report: No change from baseline Vision - Assessment Eye Alignment: Within Functional Limits  Cognition Overall Cognitive Status: Impaired Arousal/Alertness: Awake/alert Memory: Impaired Awareness: Impaired Problem Solving: Impaired Sensation Sensation Light Touch: Appears Intact Stereognosis: Appears Intact Hot/Cold: Appears Intact Proprioception: Appears Intact BUE Coordination Gross Motor Movements are Fluid and Coordinated: Yes Fine Motor Movements are Fluid and Coordinated: Yes Motor    Mobility  Bed Mobility Bed Mobility: Supine to Sit Rolling Right: 3: Mod assist Rolling Right Details: Verbal cues for sequencing;Verbal cues for technique Rolling Left: 3: Mod assist Rolling Left Details: Verbal cues for sequencing;Verbal cues for technique Supine to Sit: 2: Max assist Supine to Sit Details: Verbal cues for technique;Verbal cues for sequencing Sitting - Scoot to Edge of Bed: 3: Mod assist Transfers Sit to Stand: 1: +2 Total assist Stand to Sit: 1: +2 Total assist  Trunk/Postural Assessment     Balance   Extremity/Trunk Assessment RUE Assessment RUE Assessment: Within Functional Limits (MMT: 4/5) LUE Assessment  LUE Assessment: Within Functional Limits (MMT: 4/5)  See FIM for current functional status Refer to Care Plan for Long Term Goals  Recommendations for other services: None  Discharge Criteria: Patient will be discharged from OT if patient refuses treatment 3 consecutive times  without medical reason, if treatment goals not met, if there is a change in medical status, if patient makes no progress towards goals or if patient is discharged from hospital.  The above assessment, treatment plan, treatment alternatives and goals were discussed and mutually agreed upon: by patient and by family  Skilled Therapeutic Interventions/Progress Updates:  OT eval completed this date. Husband present during eval and provided background information. LB bathing and dressing performed in bed. UB bathing and dressing performed seated on edge of bed. Total +2 for stand pivot transfer to w/c. Tx session with focus on bed mobility, bathing, dressing, functional transfers, and sitting balance,    Limmie Patricia, OTR/L 12/16/2011, 9:51 AM

## 2011-12-16 NOTE — Progress Notes (Addendum)
Subjective:  Just back from rehab session/ husband in room /no cos, hd later today Objective Vital signs in last 24 hours: Filed Vitals:   12/15/11 2100 12/15/11 2105 12/15/11 2145 12/16/11 0500  BP: 110/50  104/50 115/69  Pulse: 64 80 80 81  Temp: 99.4 F (37.4 C)  98.5 F (36.9 C) 98.5 F (36.9 C)  TempSrc: Oral  Oral Oral  Resp: 16   19  Weight:    76.1 kg (167 lb 12.3 oz)  SpO2:   98% 92%   Weight change:   Intake/Output Summary (Last 24 hours) at 12/16/11 1125 Last data filed at 12/16/11 0853  Gross per 24 hour  Intake    180 ml  Output      0 ml  Net    180 ml   Labs: Basic Metabolic Panel:  Lab 12/14/11 4098 12/13/11 1227 12/12/11 1414  NA 135 136 138  K 4.1 3.7 3.4*  CL 93* 93* 93*  CO2 27 29 30   GLUCOSE 105* 145* 167*  BUN 29* 23 15  CREATININE 5.98* 4.62* 3.09*  CALCIUM 9.5 9.7 9.5  ALB -- -- --  PHOS 6.1* -- --   Liver Function Tests:  Lab 12/14/11 0659 12/12/11 1414  AST -- 30  ALT -- 18  ALKPHOS -- 111  BILITOT -- 1.8*  PROT -- 8.0  ALBUMIN 2.5* 3.0*   No results found for this basename: LIPASE:3,AMYLASE:3 in the last 168 hours No results found for this basename: AMMONIA:3 in the last 168 hours CBC:  Lab 12/14/11 0739 12/13/11 1227 12/12/11 1414  WBC 11.6* 7.9 14.0*  NEUTROABS -- -- --  HGB 9.1* 9.3* 10.8*  HCT 28.9* 29.4* 33.2*  MCV 80.7 81.0 79.6  PLT 325 303 344   Cardiac Enzymes: No results found for this basename: CKTOTAL:5,CKMB:5,CKMBINDEX:5,TROPONINI:5 in the last 168 hours CBG:  Lab 12/16/11 0723 12/15/11 2138 12/15/11 1645 12/15/11 1145 12/15/11 0753  GLUCAP 91 85 130* 262* 282*    Iron Studies: No results found for this basename: IRON,TIBC,TRANSFERRIN,FERRITIN in the last 72 hours Studies/Results: Dg Chest 2 View  12/15/2011  *RADIOLOGY REPORT*  Clinical Data: 56 year old female cough and fever. End-stage renal disease.  CHEST - 2 VIEW  Comparison: 12/05/2011 and earlier.  Findings: Seated AP and lateral views of the  chest. Stable cardiomegaly and mediastinal contours.  Stable sequelae of CABG. No pneumothorax or pulmonary edema.  Pulmonary vascularity is decreased compared to 11/24/2011. Chronic prominence of central/hilar vasculature is stable.  No pleural effusion or consolidation.  No confluent pulmonary opacity identified. No acute osseous abnormality identified.  IMPRESSION: Stable cardiomegaly.  Decreased vascular congestion from earlier in the month.  No focal pneumonia identified.   Original Report Authenticated By: Harley Hallmark, M.D.    Medications:      . amLODipine  5 mg Oral QHS  . aspirin EC  81 mg Oral Daily  . cinacalcet  30 mg Oral Daily  . cinacalcet  90 mg Oral QPM  . darbepoetin (ARANESP) injection - DIALYSIS  25 mcg Intravenous Q Wed-HD  . ezetimibe  10 mg Oral Daily  . fenofibrate  54 mg Oral Daily  . gabapentin  400 mg Oral QHS  . insulin aspart  0-9 Units Subcutaneous TID WC  . insulin aspart  3 Units Subcutaneous TID WC  . insulin glargine  6 Units Subcutaneous QHS  . multivitamin  1 tablet Oral QHS  . pantoprazole  40 mg Oral Q1200  . polyethylene glycol  17 g Oral Daily  . sevelamer  2,400 mg Oral TID WC  . Warfarin - Pharmacist Dosing Inpatient   Does not apply q1800   I  have reviewed scheduled and prn medications.   Physical Exam:  General: Alert, NAD, , Appropriate Heart=RRR no rub or murmur  Lungs:= CTA  Abdomen: BS =+. Soft, nontender  Extremities: Dialysis Access: Right BKA Wrap dry clean/ no pedal edma / Pos. Bruit R U A AVGG  Dialysis Orders: Center: [facility] on MWF Optiflux 180  EDW 78.5 kg HD Bath 2K 2.0 Ca  Time3.5 Heparin 7000. Access right upper AVGG BFR 500 DFR 800  Zemplar 18 mcg IV/HD Epogen 1000 Units IV/HD Venofer none Other profile 4; ferritins consistently in the 2000s and tsats in the 20s  Assessment/Plan:  1. S/P right BKA- per Dr. Lajoyce Corners on 10/21/.today starting  [facility] /  2. Low grade fever= afebrile this am/ CBC pending hd today / cxr yesterday  no  PNA noted./ no antibiotics for now / post op wound per Dr. Sunny Schlein team. Send urine for UA w reflex.  3. ESRD - MWF( [facility]) - lower edw at dc with BKA  4. Hypertension/volume -115/65 bp this am / on Amlodipine 5mg  at HS/ monitor bps with hd may be able to dc med in hospital if bp drops lower. 5. Anemia - Hgb stable at 9.1  Pending cbc this am pre hd/ On Epo as outpt;  On Aranesp.Monitor jhgb and increase Aranesp if further drop in hgb 6. Metabolic bone disease - Prone to hypercalcemia due to high dose zemplar( 530 pth On 9//2013 labs and down to 245.5 with labs noted 12/07/11 will decrease zemplar to ) - on 2 Ca bath;last CA Corrected =Ca 10.7 on Renvela 2400mg  ac and sensipar 120 mg  q day as out pt. ; P at 6.1 Follow labs pre hd today 7. Nutrition - Alb 2.5 down from 3.9 on 12/07/11 and gradually losing weight - likely due to Grangrene Needing to start ONSP after discharge. 8. VTE prophylaxis - INR = 4.23on coumadin //??? ? Duration 9. Diabetes mellitus -  on Lantus + SSI;   Lenny Pastel, PA-C [facility] [phone] 12/16/2011,11:25 AM  LOS: 1 day   Patient seen and examined and agree with assessment and plan as above with additions as indicated.  Vinson Moselle  MD [facility] [phone] pgr    [phone] cell 12/16/2011, 12:33 PM

## 2011-12-16 NOTE — Progress Notes (Signed)
Physical Therapy Session Note  Patient Details  Name: Faith [name] MRN: [id number] Date of Birth: 15-Dec-1955  Today's Date: 12/16/2011 Time: 6962-9528 Time Calculation (min): 60 min  Short Term Goals: Week 1:  PT Short Term Goal 1 (Week 1): pt will move supine > sit with mod assist in flat bed without rails PT Short Term Goal 2 (Week 1): pt will transfer bed>< w/c with mod assist PT Short Term Goal 3 (Week 1): pt will propel w/c x 69' with supervision PT Short Term Goal 4 (Week 1): pt will manipulate w/c parts for transfer with mod assist  Skilled Therapeutic Interventions/Progress Updates:   Patient resting in bed; reports fatigue but able to participate.  Performed bed mobility bridging and scooting hips to the L with mod-max A and rolling to R side and side > sit with mod-max A with total verbal cues for sequence and initiation.  Performed slideboard transfer bed > w/c to L side with mod-max A with total verbal and tactile cues for hand placement, full anterior lean and to advance buttocks. Performed w/c mobility on unit x 150' with min A to maintain straight navigation.  Demonstrated to patient and had patient return demonstrate w/c parts management in preparation for transfers; continued to require max verbal and visual cues for w/c set up sequence and parts management once back in the room.  Uphill slideboard transfer w/c > bed to R with max A.  Sit > supine with max A secondary to impaired ability to follow cues/commands secondary to fatigue.     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) Required Braces or Orthoses: Knee Immobilizer - Right Knee Immobilizer - Right: On except when in CPM Restrictions Weight Bearing Restrictions: Yes RLE Weight Bearing: Non weight bearing Vital Signs: Therapy Vitals Temp: 97.8 F (36.6 C) Temp src: Oral Pulse Rate: 75  Resp: 16  BP: 96/48 mmHg Patient  Position, if appropriate: Lying Oxygen Therapy SpO2: 96 % O2 Device: Nasal cannula O2 Flow Rate (L/min): 2 L/min Pain: Pain Assessment Pain Assessment: 0-10 Pain Score:   5 Pain Type: Phantom pain;Surgical pain Pain Location: Leg Pain Orientation: Right Pain Descriptors: Tender Pain Frequency: Constant Pain Onset: On-going Pain Intervention(s): RN made aware;Refused more medication secondary to lethargy Locomotion : Wheelchair Mobility Distance: 150   See FIM for current functional status  Therapy/Group: Individual Therapy  Edman Circle [facility] 12/16/2011, 4:38 PM

## 2011-12-16 NOTE — Progress Notes (Signed)
ANTICOAGULATION CONSULT NOTE - Follow Up Consult  Pharmacy Consult for Coumadin Indication: VTE prophylaxis  Allergies  Allergen Reactions  . Metoclopramide Hcl Other (See Comments)    Tardive dyskinesia  . Codeine Nausea And Vomiting  . Lipitor (Atorvastatin Calcium) Other (See Comments)    Muscle weakness    Patient Measurements: Weight: 167 lb 12.3 oz (76.1 kg) Heparin Dosing Weight:   Vital Signs: Temp: 98.5 F (36.9 C) (10/25 0500) Temp src: Oral (10/25 0500) BP: 115/69 mmHg (10/25 0500) Pulse Rate: 81  (10/25 0500)  Labs:  Basename 12/16/11 0612 12/15/11 0615 12/14/11 0739 12/14/11 0659 12/14/11 0658  HGB -- -- 9.1* -- --  HCT -- -- 28.9* -- --  PLT -- -- 325 -- --  APTT -- -- -- -- --  LABPROT 38.1* 37.4* -- -- 26.8*  INR 4.23* 4.12* -- -- 2.63*  HEPARINUNFRC -- -- -- -- --  CREATININE -- -- -- 5.98* --  CKTOTAL -- -- -- -- --  CKMB -- -- -- -- --  TROPONINI -- -- -- -- --    The CrCl is unknown because both a height and weight (above a minimum accepted value) are required for this calculation.   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 INR  Remains supratherapeutic despite held dose last 2 days and Coumadin 5mg  x 2 on 10/21 and 10/22. Patient did have elevated baseline INR. Will continue to hold Coumadin and follow-up AM INR. - No CBC this AM - No significant bleeding reported  Goal of Therapy:  INR 2-3   Plan:  1. No Coumadin today 2. Follow-up AM INR 3. Monitor for s/sx of bleeding  Verlene Mayer, PharmD, BCPS Pager [phone] 12/16/2011,12:35 PM

## 2011-12-17 ENCOUNTER — Inpatient Hospital Stay (HOSPITAL_COMMUNITY): Payer: Medicare (Managed Care) | Admitting: Physical Therapy

## 2011-12-17 ENCOUNTER — Inpatient Hospital Stay (HOSPITAL_COMMUNITY): Payer: Medicare (Managed Care)

## 2011-12-17 ENCOUNTER — Encounter (HOSPITAL_COMMUNITY): Payer: Self-pay | Admitting: Nephrology

## 2011-12-17 ENCOUNTER — Inpatient Hospital Stay (HOSPITAL_COMMUNITY): Payer: Medicare Other

## 2011-12-17 LAB — GLUCOSE, CAPILLARY
Glucose-Capillary: 99 mg/dL (ref 70–99)
Glucose-Capillary: 102 mg/dL — ABNORMAL HIGH (ref 70–99)
Glucose-Capillary: 91 mg/dL (ref 70–99)

## 2011-12-17 LAB — CBC
MCHC: 31.4 g/dL (ref 30.0–36.0)
MCV: 81.3 fL (ref 78.0–100.0)
Platelets: 356 10*3/uL (ref 150–400)
RDW: 16.8 % — ABNORMAL HIGH (ref 11.5–15.5)
WBC: 11.9 10*3/uL — ABNORMAL HIGH (ref 4.0–10.5)

## 2011-12-17 LAB — RENAL FUNCTION PANEL
Albumin: 2.4 g/dL — ABNORMAL LOW (ref 3.5–5.2)
Calcium: 10.3 mg/dL (ref 8.4–10.5)
GFR calc Af Amer: 14 mL/min — ABNORMAL LOW (ref >90)
Phosphorus: 3.5 mg/dL (ref 2.3–4.6)
Potassium: 3.9 mEq/L (ref 3.5–5.1)
Sodium: 134 mEq/L — ABNORMAL LOW (ref 135–145)

## 2011-12-17 MED ORDER — WARFARIN SODIUM 2 MG PO TABS
2.0000 mg | ORAL_TABLET | Freq: Once | ORAL | Status: AC
  Administered 2011-12-17: 2 mg via ORAL
  Filled 2011-12-17: qty 1

## 2011-12-17 MED ORDER — HEPARIN SODIUM (PORCINE) 1000 UNIT/ML DIALYSIS
5000.0000 [IU] | INTRAMUSCULAR | Status: DC | PRN
  Filled 2011-12-17: qty 5

## 2011-12-17 MED ORDER — ACETAMINOPHEN 325 MG PO TABS
ORAL_TABLET | ORAL | Status: AC
  Filled 2011-12-17: qty 2

## 2011-12-17 MED ORDER — ALBUMIN HUMAN 25 % IV SOLN
12.5000 g | Freq: Once | INTRAVENOUS | Status: AC
  Administered 2011-12-17 (×2): 12.5 g via INTRAVENOUS
  Filled 2011-12-17: qty 50

## 2011-12-17 MED ORDER — ALBUMIN HUMAN 25 % IV SOLN
INTRAVENOUS | Status: AC
  Administered 2011-12-17: 12.5 g via INTRAVENOUS
  Filled 2011-12-17: qty 50

## 2011-12-17 NOTE — Progress Notes (Signed)
ANTICOAGULATION CONSULT NOTE - Follow Up Consult  Pharmacy Consult for Coumadin Indication: VTE prophylaxis  Allergies  Allergen Reactions  . Metoclopramide Hcl Other (See Comments)    Tardive dyskinesia  . Codeine Nausea And Vomiting  . Lipitor (Atorvastatin Calcium) Other (See Comments)    Muscle weakness    Patient Measurements: Weight: 166 lb 7.2 oz (75.5 kg) Heparin Dosing Weight:   Vital Signs: Temp: 99.8 F (37.7 C) (10/26 0500) Temp src: Oral (10/26 0500) BP: 119/62 mmHg (10/26 0500) Pulse Rate: 88  (10/26 0500)  Labs:  Basename 12/17/11 0630 12/16/11 1555 12/16/11 0612 12/15/11 0615  HGB -- 8.9* -- --  HCT -- 28.8* -- --  PLT -- 313 -- --  APTT -- -- -- --  LABPROT 31.1* -- 38.1* 37.4*  INR 3.21* -- 4.23* 4.12*  HEPARINUNFRC -- -- -- --  CREATININE -- 6.06* -- --  CKTOTAL -- -- -- --  CKMB -- -- -- --  TROPONINI -- -- -- --    The CrCl is unknown because both a height and weight (above a minimum accepted value) are required for this calculation.   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 INR  Remains supratherapeutic but trending down.  Doses were held the last 3 days and Coumadin 5mg  x 2 on 10/21 and 10/22. Patient did have elevated baseline INR. Will resume Coumadin today and follow-up AM INR. - CBC stable - No significant bleeding reported  Goal of Therapy:  INR 2-3   Plan:  1. Coumadin 2mg  today x 1 dose 2. Follow-up AM INR 3. Monitor for s/sx of bleeding  Verlene Mayer, PharmD, BCPS Pager [phone] 12/17/2011,10:41 AM

## 2011-12-17 NOTE — Progress Notes (Signed)
Occupational Therapy Session Note  Patient Details  Name: Faith [name] MRN: [id number] Date of Birth: 1955/03/24  Today's Date: 12/17/2011 Time: 1004-1102 Time Calculation (min): 58 min  Skilled Therapeutic Interventions:  ADL-retraining at bed-level, due to patient fatigue, with emphasis on energy conservation, pain management, bed mobility, and family (husband) training on dependent transfers.  Patient required total assist transfer to return to bed and accepted encouragement to complete bathing and dressing with assist, when needed, by rolling side to side.   Patient able to attend to washing her face, upper body and perineal area with multiple rest breaks and only min assist to don her shirt but req max assist for lower body bathing and dressing.   Multiple MD visits during treatment to address fatigue, pain, ESRD, and possible pleural effusion.  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) Required Braces or Orthoses: Knee Immobilizer - Right Knee Immobilizer - Right: On except when in CPM Restrictions Weight Bearing Restrictions: Yes RLE Weight Bearing: Non weight bearing  Pain: Pain Assessment Pain Assessment: 0-10 Pain Score: 5 Pain Type: Surgical pain Pain Location: Leg Pain Orientation: Right Pain Descriptors: Sharp Pain Frequency: Intermittent Pain Onset: with activity Patients Stated Pain Goal: 0 Pain Intervention(s): repositioned, distraction Multiple Pain Sites: No  See FIM for current functional status  Therapy/Group: Individual Therapy  Second session: Time: 1400  Time Calculation (min):  0 min  MISSED TX: Patient sent to x-ray/dialysis  Pain Assessment: n/a  Skilled Therapeutic Interventions: n/a See FIM for current functional status  Therapy/Group:   Georgeanne Nim 12/17/2011, 12:46 PM

## 2011-12-17 NOTE — Progress Notes (Signed)
Physical Therapy Note  Patient Details  Name: Faith [name] MRN: [id number] Date of Birth: 02/17/1956 Today's Date: 12/17/2011  1100-1145 (45 minutes) individual (Pt missed 15 minutes secondary to extra dialysis for SOB) Pain : 9/10 RT BKA / nurse notified/ meds given Focus of treatment: Therapeutic exercises RT BKA ; bed mobility training; transfer training Treatment: Pt in bed upon arrival; Therapeutic exercises RT BKA X 10 AA hip/knee flexion, hip abduction; immobilizer reapplied ; supine to side to sit using bed rail min/mod assist; transfer with sliding board- setup + mod/max assist (pt 20%) WC ><bed. Pt using 1 L oxygen [place] with sats 100 %; MD states pt needs to have extra dialysis treatment now, returned to bed.    Faith [name],[name] 12/17/2011, 7:53 AM

## 2011-12-17 NOTE — Progress Notes (Signed)
Patient ID: Faith [name], female   DOB: 1955-12-31, 56 y.o.   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 Lajoyce Corners. Post op started on coumadin for VTE prophylaxis. KI ordered to help keep knee in extension.  ESRD on HD  Subjective/Complaints: SOB without O2 even with minimal activity   Objective: Vital Signs: Blood pressure 119/62, pulse 88, temperature 99.8 F (37.7 C), temperature source Oral, resp. rate 18, weight 75.5 kg (166 lb 7.2 oz), SpO2 96.00%. Dg Chest 2 View  12/15/2011  *RADIOLOGY REPORT*  Clinical Data: 56 year old female cough and fever. End-stage renal disease.  CHEST - 2 VIEW  Comparison: 12/05/2011 and earlier.  Findings: Seated AP and lateral views of the chest. Stable cardiomegaly and mediastinal contours.  Stable sequelae of CABG. No pneumothorax or pulmonary edema.  Pulmonary vascularity is decreased compared to 11/24/2011. Chronic prominence of central/hilar vasculature is stable.  No pleural effusion or consolidation.  No confluent pulmonary opacity identified. No acute osseous abnormality identified.  IMPRESSION: Stable cardiomegaly.  Decreased vascular congestion from earlier in the month.  No focal pneumonia identified.   Original Report Authenticated By: Harley Hallmark, M.D.    Results for orders placed during the hospital encounter of 12/15/11 (from the past 72 hour(s))  GLUCOSE, CAPILLARY     Status: Abnormal   Collection Time   12/15/11  4:45 PM      Component Value Range Comment   Glucose-Capillary 130 (*) 70 - 99 mg/dL   GLUCOSE, CAPILLARY     Status: Normal   Collection Time   12/15/11  9:38 PM      Component Value Range Comment   Glucose-Capillary 85  70 - 99 mg/dL   PROTIME-INR     Status: Abnormal   Collection Time    12/16/11  6:12 AM      Component Value Range Comment   Prothrombin Time 38.1 (*) 11.6 - 15.2 seconds    INR 4.23 (*) 0.00 - 1.49   GLUCOSE, CAPILLARY     Status: Normal   Collection Time   12/16/11  7:23 AM      Component Value Range Comment   Glucose-Capillary 91  70 - 99 mg/dL   GLUCOSE, CAPILLARY     Status: Abnormal   Collection Time   12/16/11 11:48 AM      Component Value Range Comment   Glucose-Capillary 156 (*) 70 - 99 mg/dL    Comment 1 Documented in Chart      Comment 2 Notify RN     CBC     Status: Abnormal   Collection Time   12/16/11  3:55 PM      Component Value Range Comment   WBC 13.2 (*) 4.0 - 10.5 K/uL    RBC 3.55 (*) 3.87 - 5.11 MIL/uL    Hemoglobin 8.9 (*) 12.0 - 15.0 g/dL    HCT 56.2 (*) 13.0 - 46.0 %    MCV 81.1  78.0 - 100.0 fL    MCH 25.1 (*) 26.0 - 34.0 pg    MCHC 30.9  30.0 - 36.0 g/dL    RDW 86.5 (*) 78.4 - 15.5 %    Platelets 313  150 - 400 K/uL   RENAL FUNCTION PANEL     Status: Abnormal   Collection Time  12/16/11  3:55 PM      Component Value Range Comment   Sodium 131 (*) 135 - 145 mEq/L    Potassium 3.9  3.5 - 5.1 mEq/L    Chloride 91 (*) 96 - 112 mEq/L    CO2 26  19 - 32 mEq/L    Glucose, Bld 170 (*) 70 - 99 mg/dL    BUN 28 (*) 6 - 23 mg/dL    Creatinine, Ser 1.61 (*) 0.50 - 1.10 mg/dL    Calcium 9.5  8.4 - 09.6 mg/dL    Phosphorus 4.9 (*) 2.3 - 4.6 mg/dL    Albumin 2.4 (*) 3.5 - 5.2 g/dL    GFR calc non Af Amer 7 (*) >90 mL/min    GFR calc Af Amer 8 (*) >90 mL/min   GLUCOSE, CAPILLARY     Status: Normal   Collection Time   12/16/11  9:26 PM      Component Value Range Comment   Glucose-Capillary 87  70 - 99 mg/dL   GLUCOSE, CAPILLARY     Status: Abnormal   Collection Time   12/16/11 10:59 PM      Component Value Range Comment   Glucose-Capillary 113 (*) 70 - 99 mg/dL   GLUCOSE, CAPILLARY     Status: Normal   Collection Time   12/17/11  4:45 AM      Component Value Range Comment   Glucose-Capillary 99  70 - 99 mg/dL     Comment 1 Notify RN     PROTIME-INR     Status: Abnormal   Collection Time   12/17/11  6:30 AM      Component Value Range Comment   Prothrombin Time 31.1 (*) 11.6 - 15.2 seconds    INR 3.21 (*) 0.00 - 1.49   GLUCOSE, CAPILLARY     Status: Abnormal   Collection Time   12/17/11  8:09 AM      Component Value Range Comment   Glucose-Capillary 102 (*) 70 - 99 mg/dL    Comment 1 Notify RN        HEENT: normal Cardio: RRR Resp: Rales GI: BS positive and Distention Extremity:  No Edema Skin:   Other R BKA with dressing and immobilizer in PT Neuro: Alert/Oriented, Abnormal Sensory absent proprioception, absent lt touch L foot, Abnormal Motor 2-/5 R ankle DF/PF, Dysarthric and Other tardive dyskinesia lip smacking Musc/Skel:  Other R BKA Gen: NAD   Assessment/Plan: 1. Functional deficits secondary to R BKA, DM with neuropathy which require 3+ hours per day of interdisciplinary therapy in a comprehensive inpatient rehab setting. Physiatrist is providing close team supervision and 24 hour management of active medical problems listed below. Physiatrist and rehab team continue to assess barriers to discharge/monitor patient progress toward functional and medical goals. FIM: FIM - Bathing Bathing Steps Patient Completed: Chest;Right Arm;Left Arm;Abdomen;Front perineal area;Buttocks;Right upper leg;Left upper leg;Left lower leg (including foot) Bathing: 2: Max-Patient completes 3-4 4f 10 parts or 25-49% (LB supine in bed. UB sitting EOB.)  FIM - Upper Body Dressing/Undressing Upper body dressing/undressing steps patient completed: Thread/unthread right sleeve of pullover shirt/dresss;Thread/unthread left sleeve of pullover shirt/dress;Put head through opening of pull over shirt/dress;Pull shirt over trunk Upper body dressing/undressing: 4: Min-Patient completed 75 plus % of tasks FIM - Lower Body Dressing/Undressing Lower body dressing/undressing steps patient completed: Thread/unthread right  underwear leg;Thread/unthread left underwear leg;Pull underwear up/down;Thread/unthread right pants leg;Thread/unthread left pants leg;Pull pants up/down;Don/Doff left sock;Don/Doff left shoe;Fasten/unfasten left shoe Lower body dressing/undressing: 1: Total-Patient  completed less than 25% of tasks (performed in bed.)  FIM - Toileting Toileting steps completed by patient: Adjust clothing prior to toileting;Performs perineal hygiene;Adjust clothing after toileting Toileting: 1: Total-Patient completed zero steps, helper did all 3  FIM - Toilet Transfers Toilet Transfers: 1-Two helpers  FIM - Banker Devices: Sliding board Bed/Chair Transfer: 3: Supine > Sit: Mod A (lifting assist/Pt. 50-74%/lift 2 legs;3: Sit > Supine: Mod A (lifting assist/Pt. 50-74%/lift 2 legs);2: Bed > Chair or W/C: Max A (lift and lower assist);2: Chair or W/C > Bed: Max A (lift and lower assist)  FIM - Locomotion: Wheelchair Distance: 150 Locomotion: Wheelchair: 4: Travels 150 ft or more: maneuvers on rugs and over door sillls with minimal assistance (Pt.>75%) FIM - Locomotion: Ambulation Locomotion: Ambulation: 0: Activity did not occur  Comprehension Comprehension Mode: Auditory Comprehension: 5-Follows basic conversation/direction: With no assist  Expression Expression Mode: Verbal Expression: 5-Expresses basic needs/ideas: With no assist  Social Interaction Social Interaction: 5-Interacts appropriately 90% of the time - Needs monitoring or encouragement for participation or interaction.  Problem Solving Problem Solving: 5-Solves basic problems: With no assist  Memory Memory: 6-More than reasonable amt of time  Medical Problem List and Plan:  1. DVT Prophylaxis/Anticoagulation: Pharmaceutical: Coumadin  2. Pain Management: monitor on prn Medication for now.  3. Mood: difficult to judge. Will monitor for now and have LCSW follow for evaluation.  4. Neuropsych:  This patient is capable of making decisions on his/her own behalf.  5. 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 SSI for elevated BS.  6. ESRD: HD M, W, F. Daily weights. Renal diet.  7. Low grade fevers: will check CXR. Husband reports congested cough for a few weeks.     LOS (Days) 2 A FACE TO FACE EVALUATION WAS PERFORMED  Willis Holquin E 12/17/2011, 10:13 AM

## 2011-12-17 NOTE — Progress Notes (Signed)
Subjective:  Just back from rehab session/ husband in room /no cos, hd later today Objective Vital signs in last 24 hours: Filed Vitals:   12/16/11 1859 12/16/11 1923 12/16/11 2223 12/17/11 0500  BP: 120/59 108/59 119/82 119/62  Pulse: 85 82  88  Temp:  98.2 F (36.8 C)  99.8 F (37.7 C)  TempSrc:  Oral  Oral  Resp: 17 16  18   Weight:  75.2 kg (165 lb 12.6 oz)  75.5 kg (166 lb 7.2 oz)  SpO2:  98%  96%   Weight change: 2.4 kg (5 lb 4.7 oz)  Intake/Output Summary (Last 24 hours) at 12/17/11 1119 Last data filed at 12/17/11 0941  Gross per 24 hour  Intake    540 ml  Output   2000 ml  Net  -1460 ml   Labs: Basic Metabolic Panel:  Lab 12/16/11 1610 12/14/11 0659 12/13/11 1227  NA 131* 135 136  K 3.9 4.1 3.7  CL 91* 93* 93*  CO2 26 27 29   GLUCOSE 170* 105* 145*  BUN 28* 29* 23  CREATININE 6.06* 5.98* 4.62*  CALCIUM 9.5 9.5 9.7  ALB -- -- --  PHOS 4.9* 6.1* --   Liver Function Tests:  Lab 12/16/11 1555 12/14/11 0659 12/12/11 1414  AST -- -- 30  ALT -- -- 18  ALKPHOS -- -- 111  BILITOT -- -- 1.8*  PROT -- -- 8.0  ALBUMIN 2.4* 2.5* 3.0*   No results found for this basename: LIPASE:3,AMYLASE:3 in the last 168 hours No results found for this basename: AMMONIA:3 in the last 168 hours CBC:  Lab 12/16/11 1555 12/14/11 0739 12/13/11 1227 12/12/11 1414  WBC 13.2* 11.6* 7.9 --  NEUTROABS -- -- -- --  HGB 8.9* 9.1* 9.3* --  HCT 28.8* 28.9* 29.4* --  MCV 81.1 80.7 81.0 79.6  PLT 313 325 303 --   Cardiac Enzymes: No results found for this basename: CKTOTAL:5,CKMB:5,CKMBINDEX:5,TROPONINI:5 in the last 168 hours CBG:  Lab 12/17/11 0809 12/17/11 0445 12/16/11 2259 12/16/11 2126 12/16/11 1148  GLUCAP 102* 99 113* 87 156*    Iron Studies: No results found for this basename: IRON,TIBC,TRANSFERRIN,FERRITIN in the last 72 hours Studies/Results: Dg Chest 2 View  12/15/2011  *RADIOLOGY REPORT*  Clinical Data: 56 year old female cough and fever. End-stage renal disease.   CHEST - 2 VIEW  Comparison: 12/05/2011 and earlier.  Findings: Seated AP and lateral views of the chest. Stable cardiomegaly and mediastinal contours.  Stable sequelae of CABG. No pneumothorax or pulmonary edema.  Pulmonary vascularity is decreased compared to 11/24/2011. Chronic prominence of central/hilar vasculature is stable.  No pleural effusion or consolidation.  No confluent pulmonary opacity identified. No acute osseous abnormality identified.  IMPRESSION: Stable cardiomegaly.  Decreased vascular congestion from earlier in the month.  No focal pneumonia identified.   Original Report Authenticated By: Harley Hallmark, M.D.    Medications:      . amLODipine  5 mg Oral QHS  . aspirin EC  81 mg Oral Daily  . cinacalcet  30 mg Oral Daily  . cinacalcet  90 mg Oral QPM  . darbepoetin (ARANESP) injection - DIALYSIS  25 mcg Intravenous Q Wed-HD  . ezetimibe  10 mg Oral Daily  . fenofibrate  54 mg Oral Daily  . gabapentin  400 mg Oral QHS  . insulin aspart  0-9 Units Subcutaneous TID WC  . insulin aspart  3 Units Subcutaneous TID WC  . insulin glargine  6 Units Subcutaneous QHS  . multivitamin  1 tablet Oral QHS  . pantoprazole  40 mg Oral Q1200  . paricalcitol      . paricalcitol  8 mcg Intravenous Q M,W,F-HD  . polyethylene glycol  17 g Oral Daily  . sevelamer  2,400 mg Oral TID WC  . warfarin  2 mg Oral ONCE-1800  . Warfarin - Pharmacist Dosing Inpatient   Does not apply q1800   I  have reviewed scheduled and prn medications.   Physical Exam:  General: Alert, NAD, , Appropriate Heart=RRR no rub or murmur  Lungs:= CTA  Abdomen: BS =+. Soft, nontender  Extremities: Dialysis Access: Right BKA Wrap dry clean/ no pedal edma / Pos. Bruit R U A AVGG  Dialysis Orders: Center: [facility] on MWF [facility] 180  EDW 78.5 kg HD Bath 2K 2.0 Ca  Time3.5 Heparin 7000. Access right upper AVGG BFR 500 DFR 800  Zemplar 18 mcg IV/HD Epogen 1000 Units IV/HD Venofer none Other profile 4; ferritins  consistently in the 2000s and tsats in the 20s  Assessment/Plan:  1. S/P right BKA 10/21- on rehab 2. Dyspnea- stat cxr showing vasc congestion, suspect early edema. Will do extra HD today for early chf.  3. Low grade fever- per primary, no better or worse 4. CKD- cont mwf hd. Extra hd today for vol excess  5. HTN/volume- bp's soft, will d/c norvasc (only bp med at home).  6. Anemia - Aranesp.Monitor hgb and increase Aranesp if further drop in hgb 7. Metabolic bone disease - Prone to hypercalcemia due to high dose zemplar( 530 pth On 9//2013 labs and down to 245.5 with labs noted 12/07/11 will decrease zemplar to ) - on 2 Ca bath;last CA Corrected =Ca 10.7 on Renvela 2400mg  ac and sensipar 120 mg  q day as out pt. ; P at 6.1 Follow labs pre hd today 8. Nutrition - Alb 2.5 down from 3.9 on 12/07/11 and gradually losing weight - likely due to Grangrene Needing to start ONSP after discharge. 9. Coagulopathy- ??due to coumadin (none given since in rehab). INR down to 3-4 range yesterday. Will d/w pharm 10. Diabetes mellitus -  on Lantus + SSI; per primary  Vinson Moselle  MD [facility] [phone] pgr    [phone] cell 12/17/2011, 11:28 AM

## 2011-12-18 ENCOUNTER — Inpatient Hospital Stay (HOSPITAL_COMMUNITY): Payer: Medicare (Managed Care) | Admitting: *Deleted

## 2011-12-18 DIAGNOSIS — S88119A Complete traumatic amputation at level between knee and ankle, unspecified lower leg, initial encounter: Secondary | ICD-10-CM

## 2011-12-18 LAB — GLUCOSE, CAPILLARY: Glucose-Capillary: 92 mg/dL (ref 70–99)

## 2011-12-18 LAB — PROTIME-INR: INR: 2.69 — ABNORMAL HIGH (ref 0.00–1.49)

## 2011-12-18 MED ORDER — WARFARIN SODIUM 2 MG PO TABS
2.0000 mg | ORAL_TABLET | Freq: Once | ORAL | Status: AC
  Administered 2011-12-18: 2 mg via ORAL
  Filled 2011-12-18: qty 1

## 2011-12-18 MED ORDER — HEPARIN SODIUM (PORCINE) 1000 UNIT/ML DIALYSIS
20.0000 [IU]/kg | INTRAMUSCULAR | Status: DC | PRN
  Administered 2011-12-19: 1400 [IU] via INTRAVENOUS_CENTRAL

## 2011-12-18 MED ORDER — GABAPENTIN 300 MG PO CAPS
300.0000 mg | ORAL_CAPSULE | Freq: Every day | ORAL | Status: DC
  Administered 2011-12-18 – 2012-01-03 (×17): 300 mg via ORAL
  Filled 2011-12-18 (×18): qty 1

## 2011-12-18 NOTE — Progress Notes (Signed)
ANTICOAGULATION CONSULT NOTE - Follow Up Consult  Pharmacy Consult for Coumadin Indication: VTE prophylaxis  Allergies  Allergen Reactions  . Metoclopramide Hcl Other (See Comments)    Tardive dyskinesia  . Codeine Nausea And Vomiting  . Lipitor (Atorvastatin Calcium) Other (See Comments)    Muscle weakness    Patient Measurements: Weight: 158 lb 8.2 oz (71.9 kg) Heparin Dosing Weight:   Vital Signs: Temp: 98.9 F (37.2 C) (10/27 0500) Temp src: Oral (10/27 0500) BP: 121/59 mmHg (10/27 0500) Pulse Rate: 80  (10/27 0500)  Labs:  Basename 12/18/11 0705 12/17/11 1336 12/17/11 1330 12/17/11 0630 12/16/11 1555 12/16/11 0612  HGB -- -- 8.6* -- 8.9* --  HCT -- -- 27.4* -- 28.8* --  PLT -- -- 356 -- 313 --  APTT -- -- -- -- -- --  LABPROT 27.3* -- -- 31.1* -- 38.1*  INR 2.69* -- -- 3.21* -- 4.23*  HEPARINUNFRC -- -- -- -- -- --  CREATININE -- 3.80* -- -- 6.06* --  CKTOTAL -- -- -- -- -- --  CKMB -- -- -- -- -- --  TROPONINI -- -- -- -- -- --    The CrCl is unknown because both a height and weight (above a minimum accepted value) are required for this calculation.   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 INR  Remains supratherapeutic but trending down.  Doses were held 10/23-10/25 after Coumadin 5mg  x 2 on 10/21 and 10/22. Patient did have elevated baseline INR.  Coumadin resumed yesterday.  INR therapeutic today. - CBC stable - No significant bleeding reported  Goal of Therapy:  INR 2-3   Plan:  1. Coumadin 2mg  today x 1 dose 2. Follow-up AM INR 3. Monitor for s/sx of bleeding  Celedonio Miyamoto, PharmD, BCPS Clinical Pharmacist Pager [phone]  12/18/2011,12:51 PM

## 2011-12-18 NOTE — Progress Notes (Signed)
Patient ID: Faith [name], female   DOB: September 17, 1955, 56 y.o.   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 Lajoyce Corners. Post op started on coumadin for VTE prophylaxis. KI ordered to help keep knee in extension.  ESRD on HD  Subjective/Complaints: SOB without O2 even with minimal activity, HD yesterday d/w Dr Arlean Hopping Somnolent this am, No sleep meds given, tramadol given, also has gabapentin 400mg  qhs  (max reanl dose usually 300mg )  Objective: Vital Signs: Blood pressure 121/59, pulse 80, temperature 98.9 F (37.2 C), temperature source Oral, resp. rate 16, weight 71.9 kg (158 lb 8.2 oz), SpO2 97.00%. Dg Chest Port 1 View  12/17/2011  *RADIOLOGY REPORT*  Clinical Data: 56 year old female with shortness of breath. Patient with end-stage renal disease.  PORTABLE CHEST - 1 VIEW  Comparison: 12/15/2011 and prior radiographs  Findings: Cardiomegaly is noted with pulmonary vascular congestion. CABG changes again noted. Mild scarring at the lung bases again noted. There is no evidence of focal airspace disease, pulmonary edema, suspicious pulmonary nodule/mass, pleural effusion, or pneumothorax. No acute bony abnormalities are identified.  IMPRESSION: Unchanged chest radiograph with cardiomegaly and mild pulmonary vascular congestion.   Original Report Authenticated By: Rosendo Gros, M.D.    Results for orders placed during the hospital encounter of 12/15/11 (from the past 72 hour(s))  GLUCOSE, CAPILLARY     Status: Abnormal   Collection Time   12/15/11  4:45 PM      Component Value Range Comment   Glucose-Capillary 130 (*) 70 - 99 mg/dL   GLUCOSE, CAPILLARY     Status: Normal   Collection Time   12/15/11  9:38 PM      Component Value Range Comment   Glucose-Capillary 85  70 - 99 mg/dL   PROTIME-INR     Status: Abnormal   Collection Time   12/16/11  6:12 AM      Component Value Range Comment   Prothrombin Time 38.1 (*) 11.6 - 15.2 seconds    INR 4.23 (*) 0.00 - 1.49   GLUCOSE, CAPILLARY     Status: Normal   Collection Time   12/16/11  7:23 AM      Component Value Range Comment   Glucose-Capillary 91  70 - 99 mg/dL   GLUCOSE, CAPILLARY     Status: Abnormal   Collection Time   12/16/11 11:48 AM      Component Value Range Comment   Glucose-Capillary 156 (*) 70 - 99 mg/dL    Comment 1 Documented in Chart      Comment 2 Notify RN     CBC     Status: Abnormal   Collection Time   12/16/11  3:55 PM      Component Value Range Comment   WBC 13.2 (*) 4.0 - 10.5 K/uL    RBC 3.55 (*) 3.87 - 5.11 MIL/uL    Hemoglobin 8.9 (*) 12.0 - 15.0 g/dL    HCT 13.0 (*) 86.5 - 46.0 %    MCV 81.1  78.0 - 100.0 fL    MCH 25.1 (*) 26.0 - 34.0 pg    MCHC 30.9  30.0 - 36.0 g/dL    RDW 78.4 (*) 69.6 - 15.5 %    Platelets 313  150 - 400 K/uL   RENAL FUNCTION PANEL  Status: Abnormal   Collection Time   12/16/11  3:55 PM      Component Value Range Comment   Sodium 131 (*) 135 - 145 mEq/L    Potassium 3.9  3.5 - 5.1 mEq/L    Chloride 91 (*) 96 - 112 mEq/L    CO2 26  19 - 32 mEq/L    Glucose, Bld 170 (*) 70 - 99 mg/dL    BUN 28 (*) 6 - 23 mg/dL    Creatinine, Ser 2.84 (*) 0.50 - 1.10 mg/dL    Calcium 9.5  8.4 - 13.2 mg/dL    Phosphorus 4.9 (*) 2.3 - 4.6 mg/dL    Albumin 2.4 (*) 3.5 - 5.2 g/dL    GFR calc non Af Amer 7 (*) >90 mL/min    GFR calc Af Amer 8 (*) >90 mL/min   GLUCOSE, CAPILLARY     Status: Normal   Collection Time   12/16/11  9:26 PM      Component Value Range Comment   Glucose-Capillary 87  70 - 99 mg/dL   GLUCOSE, CAPILLARY     Status: Abnormal   Collection Time   12/16/11 10:59 PM      Component Value Range Comment   Glucose-Capillary 113 (*) 70 - 99 mg/dL   GLUCOSE, CAPILLARY     Status: Normal   Collection Time    12/17/11  4:45 AM      Component Value Range Comment   Glucose-Capillary 99  70 - 99 mg/dL    Comment 1 Notify RN     PROTIME-INR     Status: Abnormal   Collection Time   12/17/11  6:30 AM      Component Value Range Comment   Prothrombin Time 31.1 (*) 11.6 - 15.2 seconds    INR 3.21 (*) 0.00 - 1.49   GLUCOSE, CAPILLARY     Status: Abnormal   Collection Time   12/17/11  8:09 AM      Component Value Range Comment   Glucose-Capillary 102 (*) 70 - 99 mg/dL    Comment 1 Notify RN     GLUCOSE, CAPILLARY     Status: Abnormal   Collection Time   12/17/11 11:31 AM      Component Value Range Comment   Glucose-Capillary 111 (*) 70 - 99 mg/dL   CBC     Status: Abnormal   Collection Time   12/17/11  1:30 PM      Component Value Range Comment   WBC 11.9 (*) 4.0 - 10.5 K/uL    RBC 3.37 (*) 3.87 - 5.11 MIL/uL    Hemoglobin 8.6 (*) 12.0 - 15.0 g/dL    HCT 44.0 (*) 10.2 - 46.0 %    MCV 81.3  78.0 - 100.0 fL    MCH 25.5 (*) 26.0 - 34.0 pg    MCHC 31.4  30.0 - 36.0 g/dL    RDW 72.5 (*) 36.6 - 15.5 %    Platelets 356  150 - 400 K/uL   RENAL FUNCTION PANEL     Status: Abnormal   Collection Time   12/17/11  1:36 PM      Component Value Range Comment   Sodium 134 (*) 135 - 145 mEq/L    Potassium 3.9  3.5 - 5.1 mEq/L    Chloride 95 (*) 96 - 112 mEq/L    CO2 30  19 - 32 mEq/L    Glucose, Bld 135 (*) 70 - 99 mg/dL    BUN  15  6 - 23 mg/dL DELTA CHECK NOTED   Creatinine, Ser 3.80 (*) 0.50 - 1.10 mg/dL DELTA CHECK NOTED   Calcium 10.3  8.4 - 10.5 mg/dL    Phosphorus 3.5  2.3 - 4.6 mg/dL    Albumin 2.4 (*) 3.5 - 5.2 g/dL    GFR calc non Af Amer 12 (*) >90 mL/min    GFR calc Af Amer 14 (*) >90 mL/min   GLUCOSE, CAPILLARY     Status: Normal   Collection Time   12/17/11  5:44 PM      Component Value Range Comment   Glucose-Capillary 91  70 - 99 mg/dL   GLUCOSE, CAPILLARY     Status: Abnormal   Collection Time   12/17/11  8:59 PM      Component Value Range Comment   Glucose-Capillary 122 (*)  70 - 99 mg/dL    Comment 1 Notify RN     PROTIME-INR     Status: Abnormal   Collection Time   12/18/11  7:05 AM      Component Value Range Comment   Prothrombin Time 27.3 (*) 11.6 - 15.2 seconds    INR 2.69 (*) 0.00 - 1.49   GLUCOSE, CAPILLARY     Status: Normal   Collection Time   12/18/11  7:36 AM      Component Value Range Comment   Glucose-Capillary 92  70 - 99 mg/dL    Comment 1 Notify RN        HEENT: normal Cardio: RRR Resp: Rales GI: BS positive and Distention Extremity:  No Edema Skin:   Other R BKA with dressing and immobilizer in PT Neuro: Alert/Oriented, Abnormal Sensory absent proprioception, absent lt touch L foot, Abnormal Motor 2-/5 R ankle DF/PF, Dysarthric and Other tardive dyskinesia lip smacking Musc/Skel:  Other R BKA Gen: NAD   Assessment/Plan: 1. Functional deficits secondary to R BKA, DM with neuropathy which require 3+ hours per day of interdisciplinary therapy in a comprehensive inpatient rehab setting. Physiatrist is providing close team supervision and 24 hour management of active medical problems listed below. Physiatrist and rehab team continue to assess barriers to discharge/monitor patient progress toward functional and medical goals. FIM: FIM - Bathing Bathing Steps Patient Completed: Chest;Right Arm;Left Arm;Abdomen;Front perineal area Bathing: 3: Mod-Patient completes 5-7 84f 10 parts or 50-74%  FIM - Upper Body Dressing/Undressing Upper body dressing/undressing steps patient completed: Thread/unthread right sleeve of pullover shirt/dresss;Thread/unthread left sleeve of pullover shirt/dress;Put head through opening of pull over shirt/dress Upper body dressing/undressing: 4: Min-Patient completed 75 plus % of tasks FIM - Lower Body Dressing/Undressing Lower body dressing/undressing steps patient completed: Thread/unthread right underwear leg;Thread/unthread left underwear leg;Pull underwear up/down;Thread/unthread right pants  leg;Thread/unthread left pants leg;Pull pants up/down;Don/Doff left sock;Don/Doff left shoe;Fasten/unfasten left shoe Lower body dressing/undressing: 1: Total-Patient completed less than 25% of tasks  FIM - Toileting Toileting steps completed by patient: Adjust clothing prior to toileting;Performs perineal hygiene;Adjust clothing after toileting Toileting: 1: Total-Patient completed zero steps, helper did all 3  FIM - Toilet Transfers Toilet Transfers: 1-Two helpers  FIM - Banker Devices: Bed rails;Arm rests Bed/Chair Transfer: 1: Two helpers  FIM - Locomotion: Wheelchair Distance: 150 Locomotion: Wheelchair: 4: Travels 150 ft or more: maneuvers on rugs and over door sillls with minimal assistance (Pt.>75%) FIM - Locomotion: Ambulation Locomotion: Ambulation: 0: Activity did not occur  Comprehension Comprehension Mode: Auditory Comprehension: 5-Follows basic conversation/direction: With no assist  Expression Expression Mode: Verbal Expression: 5-Expresses basic  needs/ideas: With no assist  Social Interaction Social Interaction Mode: Asleep Social Interaction: 5-Interacts appropriately 90% of the time - Needs monitoring or encouragement for participation or interaction.  Problem Solving Problem Solving Mode: Asleep Problem Solving: 5-Solves basic problems: With no assist  Memory Memory Mode: Asleep Memory: 6-More than reasonable amt of time  Medical Problem List and Plan:  1. DVT Prophylaxis/Anticoagulation: Pharmaceutical: Coumadin  2. Pain Management: monitor on prn Medication for now.Reduce gabapentin to 300mg   3. Mood: difficult to judge. Will monitor for now and have LCSW follow for evaluation.  4. Neuropsych: This patient is capable of making decisions on his/her own behalf.  5. 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 SSI for elevated BS.  6. ESRD: HD M, W, F. Daily weights. Renal diet.  7.  Low grade fevers: resolved monitor    LOS (Days) 3 A FACE TO FACE EVALUATION WAS PERFORMED  KIRSTEINS,ANDREW E 12/18/2011, 8:55 AM

## 2011-12-18 NOTE — Progress Notes (Signed)
Occupational Therapy Session Note  Patient Details  Name: Faith [name] MRN: [id number] Date of Birth: 19-Jun-1955  Today's Date: 12/18/2011 Time:  -   1115-1230  (75 min)  Pain:  Right leg.  Unable to give number.  Short Term Goals: Week 1:    Week 2:     Skilled Therapeutic Interventions/Progress Updates:    Right  KI, R BKA.:      Took 15 mins to arouse and get to EOB.  Was max assist to get to EOB.  Performed bathing EOB.  Did not Know day of week      Pt oriented to place, BD, and month, year.  Performed rolling for periarea and donning pants.  Transferred to recliner instead of wc due to drossiness with total assist +2, pt= 20 %.    BP= 117/68; 88% sats without O2.  Applied oxygen  And placed call bell and phone in reach.      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

## 2011-12-18 NOTE — Progress Notes (Signed)
Pt sleeping most of day, pt will awake when named called, Pt oriented times 4

## 2011-12-19 ENCOUNTER — Inpatient Hospital Stay (HOSPITAL_COMMUNITY): Payer: Medicare (Managed Care) | Admitting: Occupational Therapy

## 2011-12-19 ENCOUNTER — Inpatient Hospital Stay (HOSPITAL_COMMUNITY): Payer: Medicare (Managed Care)

## 2011-12-19 ENCOUNTER — Inpatient Hospital Stay (HOSPITAL_COMMUNITY): Payer: Medicare (Managed Care) | Admitting: Physical Therapy

## 2011-12-19 LAB — RENAL FUNCTION PANEL
Albumin: 2.7 g/dL — ABNORMAL LOW (ref 3.5–5.2)
BUN: 25 mg/dL — ABNORMAL HIGH (ref 6–23)
CO2: 27 mEq/L (ref 19–32)
Calcium: 10.8 mg/dL — ABNORMAL HIGH (ref 8.4–10.5)
Chloride: 95 mEq/L — ABNORMAL LOW (ref 96–112)
Creatinine, Ser: 5.13 mg/dL — ABNORMAL HIGH (ref 0.50–1.10)
GFR calc Af Amer: 10 mL/min — ABNORMAL LOW (ref >90)
GFR calc non Af Amer: 8 mL/min — ABNORMAL LOW (ref >90)
Glucose, Bld: 114 mg/dL — ABNORMAL HIGH (ref 70–99)
Phosphorus: 4.1 mg/dL (ref 2.3–4.6)
Potassium: 4.5 mEq/L (ref 3.5–5.1)
Sodium: 132 mEq/L — ABNORMAL LOW (ref 135–145)

## 2011-12-19 LAB — CBC
HCT: 30.2 % — ABNORMAL LOW (ref 36.0–46.0)
Hemoglobin: 9.3 g/dL — ABNORMAL LOW (ref 12.0–15.0)
MCH: 25.4 pg — ABNORMAL LOW (ref 26.0–34.0)
MCHC: 30.8 g/dL (ref 30.0–36.0)
MCV: 82.5 fL (ref 78.0–100.0)
Platelets: 379 10*3/uL (ref 150–400)
RBC: 3.66 MIL/uL — ABNORMAL LOW (ref 3.87–5.11)
RDW: 16.8 % — ABNORMAL HIGH (ref 11.5–15.5)
WBC: 9.9 10*3/uL (ref 4.0–10.5)

## 2011-12-19 LAB — PROTIME-INR
INR: 2.79 — ABNORMAL HIGH (ref 0.00–1.49)
Prothrombin Time: 28 seconds — ABNORMAL HIGH (ref 11.6–15.2)

## 2011-12-19 LAB — GLUCOSE, CAPILLARY
Glucose-Capillary: 133 mg/dL — ABNORMAL HIGH (ref 70–99)
Glucose-Capillary: 98 mg/dL (ref 70–99)

## 2011-12-19 MED ORDER — WARFARIN SODIUM 1 MG PO TABS
1.0000 mg | ORAL_TABLET | Freq: Once | ORAL | Status: AC
  Administered 2011-12-19: 1 mg via ORAL
  Filled 2011-12-19: qty 1

## 2011-12-19 MED ORDER — PRO-STAT SUGAR FREE PO LIQD
30.0000 mL | Freq: Two times a day (BID) | ORAL | Status: DC
  Administered 2011-12-19 – 2011-12-28 (×14): 30 mL via ORAL
  Filled 2011-12-19 (×20): qty 30

## 2011-12-19 MED ORDER — ACETAMINOPHEN 325 MG PO TABS
ORAL_TABLET | ORAL | Status: AC
  Administered 2011-12-19: 650 mg via ORAL
  Filled 2011-12-19: qty 2

## 2011-12-19 NOTE — Progress Notes (Signed)
[facility] Progress Note  Subjective:  Doing better.  Learning to do sliding board transfers.  No recent BM  Objective Filed Vitals:   12/18/11 0500 12/18/11 1130 12/18/11 1543 12/19/11 0555  BP: 121/59 110/62 105/69 109/65  Pulse: 80 78 77 63  Temp: 98.9 F (37.2 C)  98.6 F (37 C) 98.7 F (37.1 C)  TempSrc: Oral  Oral Oral  Resp: 16 18 18 18   Weight: 71.9 kg (158 lb 8.2 oz)   71.5 kg (157 lb 10.1 oz)  SpO2: 97%  98% 97%   Physical Exam General: alert and orient.  Chronic tardive dyskinesia Heart: RRR Lungs: crackles at bases.  BS a little coarse Abdomen: obse soft + BS Extremities:left LE TEDs; right BKA in immobilizer Dialysis Access: right upper AVGG + bruit  Dialysis Orders: Center: [facility] on MWF Optiflux 180  EDW 78.5 kg HD Bath 2K 2.0 Ca Time3.5 Heparin 7000. Access right upper AVGG BFR 500 DFR 800  Zemplar 18 mcg IV/HD Epogen 1000 Units IV/HD Venofer none Other profile 4; ferritins consistently in the 2000s and tsats in the 20s  Assessment/Plan: 1. S/p right BKA - rehab in process; tight heparin with HD; also on coumadin for DVT prophylaxis 2. ESRD - MWF per routine on 2 K 2 Ca bath; check labs pre HD 3. Anemia - Hgb decreased post op - check Fe studies today. Continue aranesp; if Hgb < 8,5 increase Aranesp to 60 today. 4. Secondary hyperparathyroidism - last corrected Ca > than 11; on decreased zemplar down to 8 from 18 ; on 2 Ca bath; to check labs pre HD; continue sensipar and binders; iPTH 500s.  5. HTN/volume - previously on norvasc 5 at HS; none at present; lower EDW 6. Nutrition - albumin low on high protein renal diet; add prostat bid 7. DM - per primary  Sheffield Slider, PA-C [facility] Beeper [phone]  12/19/2011,2:12 PM  LOS: 4 days    Additional Objective Labs: Basic Metabolic Panel:  Lab 12/17/11 4540 12/16/11 1555 12/14/11 0659  NA 134* 131* 135  K 3.9 3.9 4.1  CL 95* 91* 93*  CO2 30 26 27   GLUCOSE 135* 170*  105*  BUN 15 28* 29*  CREATININE 3.80* 6.06* 5.98*  CALCIUM 10.3 9.5 9.5  ALB -- -- --  PHOS 3.5 4.9* 6.1*   Lab Results  Component Value Date   WBC 11.9* 12/17/2011   HGB 8.6* 12/17/2011   HCT 27.4* 12/17/2011   MCV 81.3 12/17/2011   PLT 356 12/17/2011   Liver Function Tests:  Lab 12/17/11 1336 12/16/11 1555 12/14/11 0659 12/12/11 1414  AST -- -- -- 30  ALT -- -- -- 18  ALKPHOS -- -- -- 111  BILITOT -- -- -- 1.8*  PROT -- -- -- 8.0  ALBUMIN 2.4* 2.4* 2.5* --   CBC: Lab Results  Component Value Date   INR 2.79* 12/19/2011   INR 2.69* 12/18/2011   INR 3.21* 12/17/2011   Medications:      . aspirin EC  81 mg Oral Daily  . cinacalcet  30 mg Oral Daily  . cinacalcet  90 mg Oral QPM  . darbepoetin (ARANESP) injection - DIALYSIS  25 mcg Intravenous Q Wed-HD  . ezetimibe  10 mg Oral Daily  . fenofibrate  54 mg Oral Daily  . gabapentin  300 mg Oral QHS  . insulin aspart  0-9 Units Subcutaneous TID WC  . insulin aspart  3 Units Subcutaneous TID WC  .  insulin glargine  6 Units Subcutaneous QHS  . multivitamin  1 tablet Oral QHS  . pantoprazole  40 mg Oral Q1200  . paricalcitol  8 mcg Intravenous Q M,W,F-HD  . polyethylene glycol  17 g Oral Daily  . sevelamer  2,400 mg Oral TID WC  . warfarin  1 mg Oral ONCE-1800  . warfarin  2 mg Oral ONCE-1800  . Warfarin - Pharmacist Dosing Inpatient   Does not apply [phone]

## 2011-12-19 NOTE — Progress Notes (Addendum)
Physical Therapy Note  Patient Details  Name: Faith [name] MRN: [id number] Date of Birth: Jul 16, 1955 Today's Date: 12/19/2011  1000-1055 (55 minutes) individual Pain: 4/10 RT BKA/premedicated Focus of treatment: transfer training; bed mobility training; bilateral LE AROM/strengthening including RT BKA exercises; therapeutic exercises to improve tolerance to activity Treatment; Pt up in wc; transfer wc <> mat with sliding board max assist +1 ( pt 15%); sit to supine mod assist LT LE and trunk; LT LE AA hip, knee flexion extension; AA hip abduction , SAQs (all X 10); RT hip flexion /extension AA 2 X 10; sitting edge of mat- ball toss to improve tolerance to activity 2 X 15 ; wc mobilty 30 feet SBA using bilateral UEs.  1445-1525 (40 minutes) individual Pain: no reported pain Focus of treatment: wc mobility; transfer training Treatment: Pt propels wc using bilateral UEs 75 feet X 1 with increased time; transfers  with sliding board wc  <> mat  Setup + mod/max assist level, mod assist downward incline; seated pushups 2 X 5 for UE strengthening to improve transfers mod assist. Pt has demonstrated improved tolerance to activity today .   Lailee Hoelzel,[name] 12/19/2011, 10:28 AM

## 2011-12-19 NOTE — Progress Notes (Signed)
Occupational Therapy Session Note  Patient Details  Name: Faith [name] MRN: [id number] Date of Birth: 01-Feb-1956  Today's Date: 12/19/2011 Time: 1300-1330 Time Calculation (min): 30 min  Short Term Goals: Week 1:  OT Short Term Goal 1 (Week 1): Pt will increase UB dressing to Supervision. OT Short Term Goal 2 (Week 1): Pt will increase bathing to Mod Assist. (LB at bed level. UB at sink). OT Short Term Goal 3 (Week 1): Pt will increase LB dressing to Mod Assist at bed level. OT Short Term Goal 4 (Week 1): Pt will increase toileting to Max Assist. OT Short Term Goal 5 (Week 1): Pt will increase toilet transfer to Max Assist with slideboard.  Skilled Therapeutic Interventions/Progress Updates:  Patient resting in w/c upon arrival stating that she was really tired and sleepy.  Patient had difficulty staying awake during session.  Issued inspection mirror for patient to use when RLE dressing was removed so she can monitor her incision.  Located drop arm commode to discuss and demonstrate benefit for and use of this piece of DME given patient's recent R BKA and using sliding board for all transfers at this time.  Patient practiced scooting forward and back in w/c and also attempted w/c pushups however required max assist to get very minimal lift of her bottom.  Patient's RN in room and providing pain medication at end of session.  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

## 2011-12-19 NOTE — Discharge Summary (Signed)
Physician Discharge Summary  Patient ID: Faith [name] MRN: [id number] DOB/AGE: 57/01/1956 57 y.o.  Admit date: 12/12/2011 Discharge date: 12/19/2011  Admission Diagnoses: Failure of the foot salvage surgery with infection osteomyelitis and ischemic changes of the foot  Discharge Diagnoses: Same Active Problems:  * No active hospital problems. *    Discharged Condition: stable  Hospital Course: Patient's hospital course was essentially unremarkable she underwent a transtibial amputation postoperatively she progressed well and was discharged to home in stable condition.  Consults: nephrology  Significant Diagnostic Studies: labs: Routine labs  Treatments: surgery: See operative note  Discharge Exam: Blood pressure 117/72, pulse 89, temperature 97.4 F (36.3 C), temperature source Oral, resp. rate 16, height 5' 4.5" (1.638 m), weight 72.3 kg (159 lb 6.3 oz), SpO2 98.00%. Incision/Wound: incision and dressing clean and dry at time of discharge  Disposition: 62-Rehab Facility  Discharge Orders    Future Appointments: Provider: Department: Dept Phone: Center:   12/19/2011 8:30 AM Charisse March [place], OT [facility] [phone] None   12/19/2011 10:00 AM Jethro Bastos, PTA [facility] [phone] None   12/19/2011 1:00 PM Fredia Beets, OT [facility] [phone] None   12/19/2011 2:45 PM Jethro Bastos, PTA [facility] [phone] None     Future Orders Please Complete By Expires   Diet - low sodium heart healthy      Call MD / Call 911      Comments:   If you experience chest pain or shortness of breath, CALL 911 and be transported to the hospital emergency room.  If you develope a fever above 101 F, pus (white drainage) or increased drainage or redness at the wound, or calf pain, call your surgeon's office.   Constipation Prevention      Comments:   Drink plenty of fluids.  Prune juice may be helpful.  You may use a stool softener, such as  Colace (over the counter) 100 mg twice a day.  Use MiraLax (over the counter) for constipation as needed.   Increase activity slowly as tolerated          Medication List     As of 12/19/2011  6:07 AM    TAKE these medications         albuterol 108 (90 BASE) MCG/ACT inhaler   Commonly known as: PROVENTIL HFA;VENTOLIN HFA   Inhale 2 puffs into the lungs every 4 (four) hours as needed. For shortness of breath      amLODipine 5 MG tablet   Commonly known as: NORVASC   Take 5 mg by mouth at bedtime.      aspirin EC 81 MG tablet   Take 81 mg by mouth daily.      cinacalcet 90 MG tablet   Commonly known as: SENSIPAR   Take 90 mg by mouth every evening. Takes 30mg  in the morning and 90mg  at night      cinacalcet 30 MG tablet   Commonly known as: SENSIPAR   Take 30 mg by mouth daily. Takes 30mg  in the morning and 90mg  at night      ezetimibe 10 MG tablet   Commonly known as: ZETIA   Take 10 mg by mouth daily.      fenofibrate micronized 134 MG capsule   Commonly known as: LOFIBRA   Take 134 mg by mouth daily.      gabapentin 400 MG capsule   Commonly known as: NEURONTIN   Take 400 mg by  mouth at bedtime.      guaiFENesin 100 MG/5ML Soln   Commonly known as: ROBITUSSIN   Take 15 mLs by mouth every 6 (six) hours as needed. For coughing/wheezing      HYDROcodone-acetaminophen 5-500 MG per tablet   Commonly known as: VICODIN   Take 1 tablet by mouth 2 (two) times daily as needed. For pain      insulin aspart 100 UNIT/ML injection   Commonly known as: novoLOG   Inject 4-12 Units into the skin 3 (three) times daily before meals. Per sliding scale      insulin glargine 100 UNIT/ML injection   Commonly known as: LANTUS   Inject 6 Units into the skin at bedtime.      multivitamin Tabs tablet   Take 1 tablet by mouth daily.      nitroGLYCERIN 0.4 MG SL tablet   Commonly known as: NITROSTAT   Place 0.4 mg under the tongue every 5 (five) minutes as needed. For chest pain       pantoprazole 40 MG tablet   Commonly known as: PROTONIX   Take 40 mg by mouth at bedtime.      polyvinyl alcohol 1.4 % ophthalmic solution   Commonly known as: LIQUIFILM TEARS   Place 1 drop into both eyes daily as needed. For dryness      sevelamer 800 MG tablet   Commonly known as: RENAGEL   Take 1,600-3,200 mg by mouth See admin instructions. Take 4 tablets with meals and 2 tablets with snacks      tetrahydrozoline 0.05 % ophthalmic solution   Place 1 drop into both eyes daily.      TYLENOL ARTHRITIS PAIN 650 MG CR tablet   Generic drug: acetaminophen   Take 650 mg by mouth every 8 (eight) hours as needed. For pain         Signed: DUDA,MARCUS V 12/19/2011, 6:07 AM

## 2011-12-19 NOTE — Progress Notes (Signed)
Occupational Therapy Session Note  Patient Details  Name: Faith [name] MRN: [id number] Date of Birth: 12/14/1955  Today's Date: 12/19/2011 Time: 0830-0930 Time Calculation (min): 60 min  Short Term Goals: Week 1:  OT Short Term Goal 1 (Week 1): Pt will increase UB dressing to Supervision. OT Short Term Goal 2 (Week 1): Pt will increase bathing to Mod Assist. (LB at bed level. UB at sink). OT Short Term Goal 3 (Week 1): Pt will increase LB dressing to Mod Assist at bed level. OT Short Term Goal 4 (Week 1): Pt will increase toileting to Max Assist. OT Short Term Goal 5 (Week 1): Pt will increase toilet transfer to Max Assist with slideboard.  Skilled Therapeutic Interventions/Progress Updates:  ADL re-training completed at bed level for LB and sink for UB. Pt in recliner upon therapy arrival. Very drowsy during tx session but easily aroused. Patient participated well and attempted all that was asked of her. UB bathing and dressing completed in recliner. Pt then transferred into bed with slideboard to complete LB bathing and dressing. Pt attempted to pull underwear and pants down and up over hips during dressing. Increased assistance still required.    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

## 2011-12-19 NOTE — Progress Notes (Signed)
Patient ID: Faith [name], female   DOB: 23-Mar-1955, 56 y.o.   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 Lajoyce Corners. Post op started on coumadin for VTE prophylaxis. KI ordered to help keep knee in extension.  ESRD on HD  Subjective/Complaints:  Less Somnolent this am, gabapentin reduced to 300mg        Objective: Vital Signs: Blood pressure 109/65, pulse 63, temperature 98.7 F (37.1 C), temperature source Oral, resp. rate 18, weight 71.5 kg (157 lb 10.1 oz), SpO2 97.00%. Dg Chest Port 1 View  12/17/2011  *RADIOLOGY REPORT*  Clinical Data: 56 year old female with shortness of breath. Patient with end-stage renal disease.  PORTABLE CHEST - 1 VIEW  Comparison: 12/15/2011 and prior radiographs  Findings: Cardiomegaly is noted with pulmonary vascular congestion. CABG changes again noted. Mild scarring at the lung bases again noted. There is no evidence of focal airspace disease, pulmonary edema, suspicious pulmonary nodule/mass, pleural effusion, or pneumothorax. No acute bony abnormalities are identified.  IMPRESSION: Unchanged chest radiograph with cardiomegaly and mild pulmonary vascular congestion.   Original Report Authenticated By: Rosendo Gros, M.D.    Results for orders placed during the hospital encounter of 12/15/11 (from the past 72 hour(s))  GLUCOSE, CAPILLARY     Status: Abnormal   Collection Time   12/16/11 11:48 AM      Component Value Range Comment   Glucose-Capillary 156 (*) 70 - 99 mg/dL    Comment 1 Documented in Chart      Comment 2 Notify RN     CBC     Status: Abnormal   Collection Time   12/16/11  3:55 PM      Component Value Range Comment   WBC 13.2 (*) 4.0 - 10.5 K/uL    RBC 3.55 (*) 3.87 - 5.11 MIL/uL    Hemoglobin 8.9 (*) 12.0 -  15.0 g/dL    HCT 86.5 (*) 78.4 - 46.0 %    MCV 81.1  78.0 - 100.0 fL    MCH 25.1 (*) 26.0 - 34.0 pg    MCHC 30.9  30.0 - 36.0 g/dL    RDW 69.6 (*) 29.5 - 15.5 %    Platelets 313  150 - 400 K/uL   RENAL FUNCTION PANEL     Status: Abnormal   Collection Time   12/16/11  3:55 PM      Component Value Range Comment   Sodium 131 (*) 135 - 145 mEq/L    Potassium 3.9  3.5 - 5.1 mEq/L    Chloride 91 (*) 96 - 112 mEq/L    CO2 26  19 - 32 mEq/L    Glucose, Bld 170 (*) 70 - 99 mg/dL    BUN 28 (*) 6 - 23 mg/dL    Creatinine, Ser 2.84 (*) 0.50 - 1.10 mg/dL    Calcium 9.5  8.4 - 13.2 mg/dL    Phosphorus 4.9 (*) 2.3 - 4.6 mg/dL    Albumin 2.4 (*) 3.5 - 5.2 g/dL    GFR calc non Af Amer 7 (*) >90 mL/min    GFR calc Af Amer 8 (*) >90 mL/min   GLUCOSE, CAPILLARY     Status: Normal   Collection Time   12/16/11  9:26 PM      Component Value Range Comment  Glucose-Capillary 87  70 - 99 mg/dL   GLUCOSE, CAPILLARY     Status: Abnormal   Collection Time   12/16/11 10:59 PM      Component Value Range Comment   Glucose-Capillary 113 (*) 70 - 99 mg/dL   GLUCOSE, CAPILLARY     Status: Normal   Collection Time   12/17/11  4:45 AM      Component Value Range Comment   Glucose-Capillary 99  70 - 99 mg/dL    Comment 1 Notify RN     PROTIME-INR     Status: Abnormal   Collection Time   12/17/11  6:30 AM      Component Value Range Comment   Prothrombin Time 31.1 (*) 11.6 - 15.2 seconds    INR 3.21 (*) 0.00 - 1.49   GLUCOSE, CAPILLARY     Status: Abnormal   Collection Time   12/17/11  8:09 AM      Component Value Range Comment   Glucose-Capillary 102 (*) 70 - 99 mg/dL    Comment 1 Notify RN     GLUCOSE, CAPILLARY     Status: Abnormal   Collection Time   12/17/11 11:31 AM      Component Value Range Comment   Glucose-Capillary 111 (*) 70 - 99 mg/dL   CBC     Status: Abnormal   Collection Time   12/17/11  1:30 PM      Component Value Range Comment   WBC 11.9 (*) 4.0 - 10.5 K/uL    RBC 3.37 (*)  3.87 - 5.11 MIL/uL    Hemoglobin 8.6 (*) 12.0 - 15.0 g/dL    HCT 16.1 (*) 09.6 - 46.0 %    MCV 81.3  78.0 - 100.0 fL    MCH 25.5 (*) 26.0 - 34.0 pg    MCHC 31.4  30.0 - 36.0 g/dL    RDW 04.5 (*) 40.9 - 15.5 %    Platelets 356  150 - 400 K/uL   RENAL FUNCTION PANEL     Status: Abnormal   Collection Time   12/17/11  1:36 PM      Component Value Range Comment   Sodium 134 (*) 135 - 145 mEq/L    Potassium 3.9  3.5 - 5.1 mEq/L    Chloride 95 (*) 96 - 112 mEq/L    CO2 30  19 - 32 mEq/L    Glucose, Bld 135 (*) 70 - 99 mg/dL    BUN 15  6 - 23 mg/dL DELTA CHECK NOTED   Creatinine, Ser 3.80 (*) 0.50 - 1.10 mg/dL DELTA CHECK NOTED   Calcium 10.3  8.4 - 10.5 mg/dL    Phosphorus 3.5  2.3 - 4.6 mg/dL    Albumin 2.4 (*) 3.5 - 5.2 g/dL    GFR calc non Af Amer 12 (*) >90 mL/min    GFR calc Af Amer 14 (*) >90 mL/min   GLUCOSE, CAPILLARY     Status: Normal   Collection Time   12/17/11  5:44 PM      Component Value Range Comment   Glucose-Capillary 91  70 - 99 mg/dL   GLUCOSE, CAPILLARY     Status: Abnormal   Collection Time   12/17/11  8:59 PM      Component Value Range Comment   Glucose-Capillary 122 (*) 70 - 99 mg/dL    Comment 1 Notify RN     PROTIME-INR     Status: Abnormal   Collection Time   12/18/11  7:05 AM      Component Value Range Comment   Prothrombin Time 27.3 (*) 11.6 - 15.2 seconds    INR 2.69 (*) 0.00 - 1.49   GLUCOSE, CAPILLARY     Status: Normal   Collection Time   12/18/11  7:36 AM      Component Value Range Comment   Glucose-Capillary 92  70 - 99 mg/dL    Comment 1 Notify RN     GLUCOSE, CAPILLARY     Status: Abnormal   Collection Time   12/18/11 11:31 AM      Component Value Range Comment   Glucose-Capillary 130 (*) 70 - 99 mg/dL    Comment 1 Notify RN     GLUCOSE, CAPILLARY     Status: Abnormal   Collection Time   12/18/11  5:09 PM      Component Value Range Comment   Glucose-Capillary 150 (*) 70 - 99 mg/dL   GLUCOSE, CAPILLARY     Status: Abnormal    Collection Time   12/18/11  9:20 PM      Component Value Range Comment   Glucose-Capillary 109 (*) 70 - 99 mg/dL    Comment 1 Notify RN     PROTIME-INR     Status: Abnormal   Collection Time   12/19/11  5:00 AM      Component Value Range Comment   Prothrombin Time 28.0 (*) 11.6 - 15.2 seconds    INR 2.79 (*) 0.00 - 1.49   GLUCOSE, CAPILLARY     Status: Normal   Collection Time   12/19/11  7:42 AM      Component Value Range Comment   Glucose-Capillary 96  70 - 99 mg/dL    Comment 1 Notify RN        HEENT: normal Cardio: RRR Resp: Rales GI: BS positive and Distention Extremity:  No Edema Skin:   Other R BKA with dressing and immobilizer in PT Neuro: Alert/Oriented, Abnormal Sensory absent proprioception, absent lt touch L foot, Abnormal Motor 2-/5 R ankle DF/PF, Dysarthric and Other tardive dyskinesia lip smacking Musc/Skel:  Other R BKA Gen: NAD   Assessment/Plan: 1. Functional deficits secondary to R BKA, DM with neuropathy which require 3+ hours per day of interdisciplinary therapy in a comprehensive inpatient rehab setting. Physiatrist is providing close team supervision and 24 hour management of active medical problems listed below. Physiatrist and rehab team continue to assess barriers to discharge/monitor patient progress toward functional and medical goals. FIM: FIM - Bathing Bathing Steps Patient Completed: Chest;Right Arm;Left Arm;Abdomen;Front perineal area;Right upper leg;Left upper leg Bathing: 3: Mod-Patient completes 5-7 43f 10 parts or 50-74%  FIM - Upper Body Dressing/Undressing Upper body dressing/undressing steps patient completed: Thread/unthread right sleeve of pullover shirt/dresss;Thread/unthread left sleeve of pullover shirt/dress;Put head through opening of pull over shirt/dress Upper body dressing/undressing: 0: Activity did not occur FIM - Lower Body Dressing/Undressing Lower body dressing/undressing steps patient completed: Thread/unthread right  underwear leg;Thread/unthread left underwear leg;Pull underwear up/down;Thread/unthread right pants leg;Thread/unthread left pants leg;Pull pants up/down;Don/Doff left sock;Don/Doff left shoe;Fasten/unfasten left shoe Lower body dressing/undressing: 0: Wears gown/pajamas-no public clothing  FIM - Toileting Toileting steps completed by patient: Adjust clothing prior to toileting;Performs perineal hygiene;Adjust clothing after toileting Toileting: 1: Total-Patient completed zero steps, helper did all 3  FIM - Toilet Transfers Toilet Transfers: 1-Two helpers  FIM - Banker Devices: Bed rails Bed/Chair Transfer: 3: Supine > Sit: Mod A (lifting assist/Pt. 50-74%/lift 2 legs;1: Two helpers  FIM - Locomotion: Wheelchair Distance: 150 Locomotion: Wheelchair: 4: Travels 150 ft or more: maneuvers on rugs and over door sillls with minimal assistance (Pt.>75%) FIM - Locomotion: Ambulation Locomotion: Ambulation: 0: Activity did not occur  Comprehension Comprehension Mode: Auditory Comprehension: 5-Follows basic conversation/direction: With no assist  Expression Expression Mode: Verbal Expression: 5-Expresses basic needs/ideas: With no assist  Social Interaction Social Interaction Mode: Asleep Social Interaction: 5-Interacts appropriately 90% of the time - Needs monitoring or encouragement for participation or interaction.  Problem Solving Problem Solving Mode: Asleep Problem Solving: 5-Solves basic problems: With no assist  Memory Memory Mode: Asleep Memory: 6-More than reasonable amt of time  Medical Problem List and Plan:  1. DVT Prophylaxis/Anticoagulation: Pharmaceutical: Coumadin  2. Pain Management: monitor on prn Medication for now.Reduce gabapentin to 300mg   3. Mood: difficult to judge. Will monitor for now and have LCSW follow for evaluation.  4. Neuropsych: This patient is capable of making decisions on his/her own behalf.  5. 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 SSI for elevated BS.  6. ESRD: HD M, W, F. Daily weights. Renal diet.  7. Low grade fevers: resolved monitor    LOS (Days) 4 A FACE TO FACE EVALUATION WAS PERFORMED  Jezelle Gullick E 12/19/2011, 8:21 AM

## 2011-12-19 NOTE — Progress Notes (Signed)
ANTICOAGULATION CONSULT NOTE - Follow Up Consult  Pharmacy Consult for Coumadin Indication: VTE prophylaxis  Allergies  Allergen Reactions  . Metoclopramide Hcl Other (See Comments)    Tardive dyskinesia  . Codeine Nausea And Vomiting  . Lipitor (Atorvastatin Calcium) Other (See Comments)    Muscle weakness    Patient Measurements: Weight: 157 lb 10.1 oz (71.5 kg) Heparin Dosing Weight:   Vital Signs: Temp: 98.7 F (37.1 C) (10/28 0555) Temp src: Oral (10/28 0555) BP: 109/65 mmHg (10/28 0555) Pulse Rate: 63  (10/28 0555)  Labs:  Basename 12/19/11 0500 12/18/11 0705 12/17/11 1336 12/17/11 1330 12/17/11 0630 12/16/11 1555  HGB -- -- -- 8.6* -- 8.9*  HCT -- -- -- 27.4* -- 28.8*  PLT -- -- -- 356 -- 313  APTT -- -- -- -- -- --  LABPROT 28.0* 27.3* -- -- 31.1* --  INR 2.79* 2.69* -- -- 3.21* --  HEPARINUNFRC -- -- -- -- -- --  CREATININE -- -- 3.80* -- -- 6.06*  CKTOTAL -- -- -- -- -- --  CKMB -- -- -- -- -- --  TROPONINI -- -- -- -- -- --    The CrCl is unknown because both a height and weight (above a minimum accepted value) are required for this calculation.   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 INR  Remains supratherapeutic but trending down.  Doses were held 10/23-10/25 after Coumadin 5mg  x 2 on 10/21 and 10/22. Patient did have elevated baseline INR.  Coumadin resumed 10/26 at 2mg /day.  INR remains therapeutic but is trending up and is at the upper end of the therapeutic range. - CBC stable - No significant bleeding reported  Goal of Therapy:  INR 2-3   Plan:  1. Coumadin 1mg  today x 1 dose 2. Follow-up AM INR 3. Monitor for s/sx of bleeding  Estella Husk, Pharm.D., BCPS Clinical Pharmacist  Phone [phone] Pager [phone] 12/19/2011, 12:22 PM

## 2011-12-20 ENCOUNTER — Inpatient Hospital Stay (HOSPITAL_COMMUNITY): Payer: Medicare (Managed Care)

## 2011-12-20 ENCOUNTER — Inpatient Hospital Stay (HOSPITAL_COMMUNITY): Payer: Medicare (Managed Care) | Admitting: Occupational Therapy

## 2011-12-20 DIAGNOSIS — S88119A Complete traumatic amputation at level between knee and ankle, unspecified lower leg, initial encounter: Secondary | ICD-10-CM

## 2011-12-20 DIAGNOSIS — G8110 Spastic hemiplegia affecting unspecified side: Secondary | ICD-10-CM

## 2011-12-20 DIAGNOSIS — I70269 Atherosclerosis of native arteries of extremities with gangrene, unspecified extremity: Secondary | ICD-10-CM

## 2011-12-20 LAB — GLUCOSE, CAPILLARY
Glucose-Capillary: 99 mg/dL (ref 70–99)
Glucose-Capillary: 115 mg/dL — ABNORMAL HIGH (ref 70–99)
Glucose-Capillary: 86 mg/dL (ref 70–99)

## 2011-12-20 LAB — IRON AND TIBC
Iron: 17 ug/dL — ABNORMAL LOW (ref 42–135)
Saturation Ratios: 10 % — ABNORMAL LOW (ref 20–55)
TIBC: 168 ug/dL — ABNORMAL LOW (ref 250–470)
UIBC: 151 ug/dL (ref 125–400)

## 2011-12-20 LAB — PROTIME-INR: INR: 2.98 — ABNORMAL HIGH (ref 0.00–1.49)

## 2011-12-20 LAB — FERRITIN: Ferritin: 2280 ng/mL — ABNORMAL HIGH (ref 10–291)

## 2011-12-20 MED ORDER — WARFARIN 0.5 MG HALF TABLET
0.5000 mg | ORAL_TABLET | Freq: Once | ORAL | Status: AC
  Administered 2011-12-20: 0.5 mg via ORAL
  Filled 2011-12-20: qty 1

## 2011-12-20 MED ORDER — HEPARIN SODIUM (PORCINE) 1000 UNIT/ML DIALYSIS
20.0000 [IU]/kg | INTRAMUSCULAR | Status: DC | PRN
  Filled 2011-12-20: qty 2

## 2011-12-20 NOTE — Progress Notes (Signed)
I have seen and examined this patient on 12/19/11 at time of progress note and agree with plan as outlined by Audree Bane, PA-C. Maygen Sirico A,MD 12/20/2011 8:18 AM

## 2011-12-20 NOTE — Progress Notes (Signed)
Occupational Therapy Session Note  Patient Details  Name: Faith [name] MRN: [id number] Date of Birth: [birth year]  Today's Date: 12/20/2011 Time: 0830-0930 Time Calculation (min): 60 min  Short Term Goals: Week 1:  OT Short Term Goal 1 (Week 1): Pt will increase UB dressing to Supervision. OT Short Term Goal 2 (Week 1): Pt will increase bathing to Mod Assist. (LB at bed level. UB at sink). OT Short Term Goal 3 (Week 1): Pt will increase LB dressing to Mod Assist at bed level. OT Short Term Goal 4 (Week 1): Pt will increase toileting to Max Assist. OT Short Term Goal 5 (Week 1): Pt will increase toilet transfer to Max Assist with slideboard.  Skilled Therapeutic Interventions/Progress Updates:  ADL re-training completed in shower this AM! Patient was on toilet upon therapy arrival. Patient was agreeable to take shower. Changed shower seat out for tub transfer bench. Dialysis port and stump were wrapped to remain dry. Session with focus on toilet transfer, shower transfer, w/c to bed transfer, and bathing and dressing. Patient fatigued easily during session and requested to stay in bed at end of session. UB dressing completed in w/c. LB dressing completed supine in bed. Due to fatigue, pt still requires Total Assist for LB dressing. Pt participating more in slideboard transfers.   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

## 2011-12-20 NOTE — Progress Notes (Signed)
Social Work Patient ID: Faith [name], female   DOB: 06-Jul-1955, 56 y.o.   MRN: [id number] Message left for [facility] [name] regarding obtaining another manuel wheelchair for pt now she is a BKA. Aware [employer] will need to approve due to pt is managed by them.  Await return call and go from there.

## 2011-12-20 NOTE — Progress Notes (Signed)
Physical Therapy Session Note  Patient Details  Name: Faith [name] MRN: [id number] Date of Birth: 1955/11/09  Today's Date: 12/20/2011 Time: 4098-1191 Time Calculation (min): 40 min  Short Term Goals: Week 1:  PT Short Term Goal 1 (Week 1): pt will move supine > sit with mod assist in flat bed without rails PT Short Term Goal 2 (Week 1): pt will transfer bed>< w/c with mod assist PT Short Term Goal 3 (Week 1): pt will propel w/c x 60' with supervision PT Short Term Goal 4 (Week 1): pt will manipulate w/c parts for transfer with mod assist Week 2:     Skilled Therapeutic Interventions/Progress Updates:   AM- Bed mobility with mod assist to roll R in flat bed> sit.  Bed> w/c slideboard transfer with mod assist to intiate, then min assist.  W/c mobility using bil hands, intermittently L foot x 45' with superviison.  Bil LE stretching and strengthening for knee extension.  Pt with significant R hamstring tightness/pain.  R KI donned at end of session.  PM-  W/c propulsion using bil UEs and LLE x 20' before fatigueing.  W/c>< mat SB transfers to L and R with mod assist.  Pt repuired mod VCs and manual cues for locking bil brakes, min assist to place SB.  Dynamic sitting balance activity reaching to retrieve small cones from floor, with R or L hand, rotating across midline, with min guard assist, without LOB 12/12 trials.  Bed mobility: sit> L sidelying>< supine, with min assist for LLE.    Therapeutic exercise performed with RLE to increase strength for functional mobility: in sidelying 10 x 1 each active hip abduction, and hip extension.     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

## 2011-12-20 NOTE — Progress Notes (Signed)
Patient ID: JEWELIANNA PANCOAST, female   DOB: 1955/12/21, 56 y.o.   MRN: [id number]  [facility] Progress Note    Subjective:   Starting to feel better   Objective:   BP 111/69  Pulse 90  Temp 99 F (37.2 C) (Oral)  Resp 18  Wt 71.9 kg (158 lb 8.2 oz)  SpO2 97%  Physical Exam: Gen:WD WN AAF in NAD CVS:no rub Resp:cta WUJ:[id number] Ext:s/p right BKA, AVG +T/B  Labs: BMET  Lab 12/19/11 1627 12/17/11 1336 12/16/11 1555 12/14/11 0659  NA 132* 134* 131* 135  K 4.5 3.9 3.9 4.1  CL 95* 95* 91* 93*  CO2 27 30 26 27   GLUCOSE 114* 135* 170* 105*  BUN 25* 15 28* 29*  CREATININE 5.13* 3.80* 6.06* 5.98*  ALBUMIN 2.7* 2.4* 2.4* 2.5*  CALCIUM 10.8* 10.3 9.5 9.5  PHOS 4.1 3.5 4.9* 6.1*   CBC  Lab 12/19/11 1627 12/17/11 1330 12/16/11 1555 12/14/11 0739  WBC 9.9 11.9* 13.2* 11.6*  NEUTROABS -- -- -- --  HGB 9.3* 8.6* 8.9* 9.1*  HCT 30.2* 27.4* 28.8* 28.9*  MCV 82.5 81.3 81.1 80.7  PLT 379 356 313 325    @IMGRELPRIORS @ Medications:      . aspirin EC  81 mg Oral Daily  . cinacalcet  30 mg Oral Daily  . cinacalcet  90 mg Oral QPM  . darbepoetin (ARANESP) injection - DIALYSIS  25 mcg Intravenous Q Wed-HD  . ezetimibe  10 mg Oral Daily  . feeding supplement  30 mL Oral BID WC  . fenofibrate  54 mg Oral Daily  . gabapentin  300 mg Oral QHS  . insulin aspart  0-9 Units Subcutaneous TID WC  . insulin aspart  3 Units Subcutaneous TID WC  . insulin glargine  6 Units Subcutaneous QHS  . multivitamin  1 tablet Oral QHS  . pantoprazole  40 mg Oral Q1200  . paricalcitol  8 mcg Intravenous Q M,W,F-HD  . polyethylene glycol  17 g Oral Daily  . sevelamer  2,400 mg Oral TID WC  . warfarin  0.5 mg Oral ONCE-1800  . warfarin  1 mg Oral ONCE-1800  . Warfarin - Pharmacist Dosing Inpatient   Does not apply q1800     Assessment/ Plan:   1. S/p right BKA - rehab in process; tight heparin with HD; also on coumadin for DVT prophylaxis  2. ESRD - MWF per routine on 2 K 2 Ca  bath; check labs pre HD.  New EDW ~71-71.5kg 3. Anemia - Hgb decreased post op - check Fe studies today. Continue aranesp; if Hgb < 8,5 increase Aranesp to 60 today.  4. Secondary hyperparathyroidism - last corrected Ca > than 11; on decreased zemplar down to 8 from 18 ; on 2 Ca bath; to check labs pre HD; continue sensipar and binders; iPTH 500s.  5. HTN/volume - previously on norvasc 5 at HS; none at present; lower EDW  6. Nutrition - albumin low on high protein renal diet; add prostat bid  7. DM - per primary 8. Dispo- per CIR Elpidia Karn A 12/20/2011, 2:30 PM

## 2011-12-20 NOTE — Progress Notes (Signed)
ANTICOAGULATION CONSULT NOTE - Follow Up Consult  Pharmacy Consult:  Coumadin Indication: VTE prophylaxis  Allergies  Allergen Reactions  . Metoclopramide Hcl Other (See Comments)    Tardive dyskinesia  . Codeine Nausea And Vomiting  . Lipitor (Atorvastatin Calcium) Other (See Comments)    Muscle weakness    Patient Measurements: Weight: 158 lb 8.2 oz (71.9 kg)  Vital Signs: Temp: 99 F (37.2 C) (10/29 0700) Temp src: Oral (10/29 0700) BP: 111/69 mmHg (10/29 0700) Pulse Rate: 90  (10/29 0700)  Labs:  Basename 12/20/11 0635 12/19/11 1627 12/19/11 0500 12/18/11 0705 12/17/11 1336 12/17/11 1330  HGB -- 9.3* -- -- -- 8.6*  HCT -- 30.2* -- -- -- 27.4*  PLT -- 379 -- -- -- 356  APTT -- -- -- -- -- --  LABPROT 29.4* -- 28.0* 27.3* -- --  INR 2.98* -- 2.79* 2.69* -- --  HEPARINUNFRC -- -- -- -- -- --  CREATININE -- 5.13* -- -- 3.80* --  CKTOTAL -- -- -- -- -- --  CKMB -- -- -- -- -- --  TROPONINI -- -- -- -- -- --    The CrCl is unknown because both a height and weight (above a minimum accepted value) are required for this calculation.     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  Coumadin was held on 10/23 through 10/25 due to a rapid increase in INR after Coumadin 5mg  x 2 on 10/21 and 10/22.   Patient did have elevated baseline INR.  Coumadin resumed 10/26 and INR has increased toward the higher end of normal range.  No bleeding reported.   Goal of Therapy:  INR 2-3    Plan:  - Coumadin 0.5mg  PO today - Daily PT / INR     Keddrick Wyne D. Laney Potash, PharmD, BCPS Pager:  [phone] 12/20/2011, 11:33 AM

## 2011-12-20 NOTE — Progress Notes (Signed)
Occupational Therapy Session Note  Patient Details  Name: Faith [name] MRN: [id number] Date of Birth: 1955-07-02  Today's Date: 12/20/2011 Time: 1430-1500 Time Calculation (min): 30 min  Short Term Goals: Week 1:  OT Short Term Goal 1 (Week 1): Pt will increase UB dressing to Supervision. OT Short Term Goal 2 (Week 1): Pt will increase bathing to Mod Assist. (LB at bed level. UB at sink). OT Short Term Goal 3 (Week 1): Pt will increase LB dressing to Mod Assist at bed level. OT Short Term Goal 4 (Week 1): Pt will increase toileting to Max Assist. OT Short Term Goal 5 (Week 1): Pt will increase toilet transfer to Max Assist with slideboard.  Skilled Therapeutic Interventions/Progress Updates:    Pt seen for 1:1 OT with focus on functional transfers with slide board.  Engaged in slide board transfer to/from drop arm BSC.  Educated pt on slide board placement prior to transfer and use of BSC hand rail to assist in transfer.  Pt with increased difficulty with transfer to [facility] secondary to slight up-slope.  Mod-max assist transfer to [facility], mod assist back to w/c.  Pt with increased fatigue post transfer, requesting to return to bed.  Slide board transfer to Rt onto bed with pt demonstrating slight increase in carryover with positioning of slide board and hand placement to assist with transfer.    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  See FIM for current functional status  Therapy/Group: Individual Therapy  Leonette Monarch 12/20/2011, 3:51 PM

## 2011-12-20 NOTE — Progress Notes (Signed)
Patient ID: Faith [name], female   DOB: November 26, 1955, 56 y.o.   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 Lajoyce Corners. Post op started on coumadin for VTE prophylaxis. KI ordered to help keep knee in extension.  ESRD on HD  Subjective/Complaints:  Less Somnolent this am, gabapentin reduced to 300mg        Objective: Vital Signs: Blood pressure 111/69, pulse 90, temperature 99 F (37.2 C), temperature source Oral, resp. rate 18, weight 71.9 kg (158 lb 8.2 oz), SpO2 97.00%. No results found. Results for orders placed during the hospital encounter of 12/15/11 (from the past 72 hour(s))  GLUCOSE, CAPILLARY     Status: Abnormal   Collection Time   12/17/11  8:09 AM      Component Value Range Comment   Glucose-Capillary 102 (*) 70 - 99 mg/dL    Comment 1 Notify RN     GLUCOSE, CAPILLARY     Status: Abnormal   Collection Time   12/17/11 11:31 AM      Component Value Range Comment   Glucose-Capillary 111 (*) 70 - 99 mg/dL   CBC     Status: Abnormal   Collection Time   12/17/11  1:30 PM      Component Value Range Comment   WBC 11.9 (*) 4.0 - 10.5 K/uL    RBC 3.37 (*) 3.87 - 5.11 MIL/uL    Hemoglobin 8.6 (*) 12.0 - 15.0 g/dL    HCT 78.2 (*) 95.6 - 46.0 %    MCV 81.3  78.0 - 100.0 fL    MCH 25.5 (*) 26.0 - 34.0 pg    MCHC 31.4  30.0 - 36.0 g/dL    RDW 21.3 (*) 08.6 - 15.5 %    Platelets 356  150 - 400 K/uL   RENAL FUNCTION PANEL     Status: Abnormal   Collection Time   12/17/11  1:36 PM      Component Value Range Comment   Sodium 134 (*) 135 - 145 mEq/L    Potassium 3.9  3.5 - 5.1 mEq/L    Chloride 95 (*) 96 - 112 mEq/L    CO2 30  19 - 32 mEq/L    Glucose, Bld 135 (*) 70 - 99 mg/dL    BUN 15  6 - 23 mg/dL DELTA CHECK NOTED   Creatinine, Ser 3.80 (*)  0.50 - 1.10 mg/dL DELTA CHECK NOTED   Calcium 10.3  8.4 - 10.5 mg/dL    Phosphorus 3.5  2.3 - 4.6 mg/dL    Albumin 2.4 (*) 3.5 - 5.2 g/dL    GFR calc non Af Amer 12 (*) >90 mL/min    GFR calc Af Amer 14 (*) >90 mL/min   GLUCOSE, CAPILLARY     Status: Normal   Collection Time   12/17/11  5:44 PM      Component Value Range Comment   Glucose-Capillary 91  70 - 99 mg/dL   GLUCOSE, CAPILLARY     Status: Abnormal   Collection Time   12/17/11  8:59 PM      Component Value Range Comment   Glucose-Capillary 122 (*) 70 - 99 mg/dL    Comment 1 Notify RN     PROTIME-INR     Status: Abnormal   Collection Time   12/18/11  7:05 AM      Component Value Range Comment   Prothrombin Time 27.3 (*) 11.6 - 15.2 seconds    INR 2.69 (*) 0.00 - 1.49   GLUCOSE, CAPILLARY     Status: Normal   Collection Time   12/18/11  7:36 AM      Component Value Range Comment   Glucose-Capillary 92  70 - 99 mg/dL    Comment 1 Notify RN     GLUCOSE, CAPILLARY     Status: Abnormal   Collection Time   12/18/11 11:31 AM      Component Value Range Comment   Glucose-Capillary 130 (*) 70 - 99 mg/dL    Comment 1 Notify RN     GLUCOSE, CAPILLARY     Status: Abnormal   Collection Time   12/18/11  5:09 PM      Component Value Range Comment   Glucose-Capillary 150 (*) 70 - 99 mg/dL   GLUCOSE, CAPILLARY     Status: Abnormal   Collection Time   12/18/11  9:20 PM      Component Value Range Comment   Glucose-Capillary 109 (*) 70 - 99 mg/dL    Comment 1 Notify RN     PROTIME-INR     Status: Abnormal   Collection Time   12/19/11  5:00 AM      Component Value Range Comment   Prothrombin Time 28.0 (*) 11.6 - 15.2 seconds    INR 2.79 (*) 0.00 - 1.49   GLUCOSE, CAPILLARY     Status: Normal   Collection Time   12/19/11  7:42 AM      Component Value Range Comment   Glucose-Capillary 96  70 - 99 mg/dL    Comment 1 Notify RN     GLUCOSE, CAPILLARY     Status: Abnormal   Collection Time   12/19/11 11:42 AM      Component  Value Range Comment   Glucose-Capillary 133 (*) 70 - 99 mg/dL    Comment 1 Notify RN     CBC     Status: Abnormal   Collection Time   12/19/11  4:27 PM      Component Value Range Comment   WBC 9.9  4.0 - 10.5 K/uL    RBC 3.66 (*) 3.87 - 5.11 MIL/uL    Hemoglobin 9.3 (*) 12.0 - 15.0 g/dL    HCT 16.1 (*) 09.6 - 46.0 %    MCV 82.5  78.0 - 100.0 fL    MCH 25.4 (*) 26.0 - 34.0 pg    MCHC 30.8  30.0 - 36.0 g/dL    RDW 04.5 (*) 40.9 - 15.5 %    Platelets 379  150 - 400 K/uL   RENAL FUNCTION PANEL     Status: Abnormal   Collection Time   12/19/11  4:27 PM      Component Value Range Comment   Sodium 132 (*) 135 - 145 mEq/L    Potassium 4.5  3.5 - 5.1 mEq/L    Chloride 95 (*) 96 - 112 mEq/L    CO2 27  19 - 32 mEq/L    Glucose, Bld 114 (*) 70 - 99 mg/dL    BUN 25 (*) 6 - 23 mg/dL    Creatinine, Ser 8.11 (*) 0.50 - 1.10 mg/dL    Calcium 91.4 (*) 8.4 - 10.5 mg/dL    Phosphorus 4.1  2.3 - 4.6 mg/dL    Albumin 2.7 (*) 3.5 - 5.2 g/dL  GFR calc non Af Amer 8 (*) >90 mL/min    GFR calc Af Amer 10 (*) >90 mL/min   FERRITIN     Status: Abnormal   Collection Time   12/19/11  4:27 PM      Component Value Range Comment   Ferritin 2280 (*) 10 - 291 ng/mL   IRON AND TIBC     Status: Abnormal   Collection Time   12/19/11  4:27 PM      Component Value Range Comment   Iron 17 (*) 42 - 135 ug/dL    TIBC 161 (*) 096 - 045 ug/dL    Saturation Ratios 10 (*) 20 - 55 %    UIBC 151  125 - 400 ug/dL   GLUCOSE, CAPILLARY     Status: Normal   Collection Time   12/19/11  9:13 PM      Component Value Range Comment   Glucose-Capillary 98  70 - 99 mg/dL      HEENT: normal Cardio: RRR Resp: Rales GI: BS positive and Distention Extremity:  No Edema Skin:   Other R BKA with dressing and immobilizer in PT Neuro: Alert/Oriented, Abnormal Sensory absent proprioception, absent lt touch L foot, Abnormal Motor 2-/5 R ankle DF/PF, Dysarthric and Other tardive dyskinesia lip smacking Musc/Skel:  Other R  BKA Gen: NAD   Assessment/Plan: 1. Functional deficits secondary to R BKA, DM with neuropathy which require 3+ hours per day of interdisciplinary therapy in a comprehensive inpatient rehab setting. Physiatrist is providing close team supervision and 24 hour management of active medical problems listed below. Physiatrist and rehab team continue to assess barriers to discharge/monitor patient progress toward functional and medical goals. FIM: FIM - Bathing Bathing Steps Patient Completed: Chest;Right Arm;Left Arm;Abdomen;Front perineal area;Buttocks;Right upper leg;Left upper leg;Left lower leg (including foot) Bathing: 3: Mod-Patient completes 5-7 33f 10 parts or 50-74%  FIM - Upper Body Dressing/Undressing Upper body dressing/undressing steps patient completed: Thread/unthread right sleeve of pullover shirt/dresss;Thread/unthread left sleeve of pullover shirt/dress;Put head through opening of pull over shirt/dress;Pull shirt over trunk Upper body dressing/undressing: 4: Min-Patient completed 75 plus % of tasks FIM - Lower Body Dressing/Undressing Lower body dressing/undressing steps patient completed: Thread/unthread right underwear leg;Thread/unthread left underwear leg;Pull underwear up/down;Thread/unthread right pants leg;Thread/unthread left pants leg;Pull pants up/down;Don/Doff left sock;Don/Doff left shoe;Fasten/unfasten left shoe Lower body dressing/undressing: 1: Total-Patient completed less than 25% of tasks  FIM - Toileting Toileting steps completed by patient: Adjust clothing prior to toileting;Performs perineal hygiene;Adjust clothing after toileting Toileting: 0: Activity did not occur  FIM - Archivist Transfers: 1-Two helpers  FIM - Banker Devices: Bed rails Bed/Chair Transfer: 4: Supine > Sit: Min A (steadying Pt. > 75%/lift 1 leg);2: Bed > Chair or W/C: Max A (lift and lower assist)  FIM - Locomotion:  Wheelchair Distance: 150 Locomotion: Wheelchair: 4: Travels 150 ft or more: maneuvers on rugs and over door sillls with minimal assistance (Pt.>75%) FIM - Locomotion: Ambulation Locomotion: Ambulation: 0: Activity did not occur  Comprehension Comprehension Mode: Auditory Comprehension: 6-Follows complex conversation/direction: With extra time/assistive device  Expression Expression Mode: Verbal Expression: 5-Expresses basic needs/ideas: With no assist  Social Interaction Social Interaction Mode: Asleep Social Interaction: 5-Interacts appropriately 90% of the time - Needs monitoring or encouragement for participation or interaction.  Problem Solving Problem Solving Mode: Asleep Problem Solving: 5-Solves basic 90% of the time/requires cueing < 10% of the time  Memory Memory Mode: Asleep Memory: 6-More than reasonable amt of time  Medical Problem List and Plan:  1. DVT Prophylaxis/Anticoagulation: Pharmaceutical: Coumadin  2. Pain Management: monitor on prn Medication for now.Reduce gabapentin to 300mg   3. Mood: difficult to judge. Will monitor for now and have LCSW follow for evaluation.  4. Neuropsych: This patient is capable of making decisions on his/her own behalf.  5. 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 SSI for elevated BS.  6. ESRD: HD M, W, F. Daily weights. Renal diet.  7. Low grade fevers: resolved monitor    LOS (Days) 5 A FACE TO FACE EVALUATION WAS PERFORMED  KIRSTEINS,ANDREW E 12/20/2011, 7:27 AM

## 2011-12-21 ENCOUNTER — Inpatient Hospital Stay (HOSPITAL_COMMUNITY): Payer: Medicare (Managed Care)

## 2011-12-21 ENCOUNTER — Inpatient Hospital Stay (HOSPITAL_COMMUNITY): Payer: Medicare (Managed Care) | Admitting: Occupational Therapy

## 2011-12-21 ENCOUNTER — Inpatient Hospital Stay (HOSPITAL_COMMUNITY): Payer: Medicare (Managed Care) | Admitting: Physical Therapy

## 2011-12-21 LAB — PROTIME-INR
INR: 4.44 — ABNORMAL HIGH (ref 0.00–1.49)
Prothrombin Time: 39.5 seconds — ABNORMAL HIGH (ref 11.6–15.2)

## 2011-12-21 LAB — CBC
HCT: 35.9 % — ABNORMAL LOW (ref 36.0–46.0)
HCT: 31.1 % — ABNORMAL LOW (ref 36.0–46.0)
Hemoglobin: 10.6 g/dL — ABNORMAL LOW (ref 12.0–15.0)
Hemoglobin: 9.7 g/dL — ABNORMAL LOW (ref 12.0–15.0)
MCHC: 29.5 g/dL — ABNORMAL LOW (ref 30.0–36.0)
MCHC: 31.2 g/dL (ref 30.0–36.0)
MCV: 83.3 fL (ref 78.0–100.0)
MCV: 81 fL (ref 78.0–100.0)
RDW: 16.9 % — ABNORMAL HIGH (ref 11.5–15.5)
WBC: 10.2 10*3/uL (ref 4.0–10.5)

## 2011-12-21 LAB — RENAL FUNCTION PANEL
BUN: 20 mg/dL (ref 6–23)
BUN: 23 mg/dL (ref 6–23)
CO2: 28 mEq/L (ref 19–32)
Calcium: 11.1 mg/dL — ABNORMAL HIGH (ref 8.4–10.5)
Chloride: 95 mEq/L — ABNORMAL LOW (ref 96–112)
Creatinine, Ser: 5.09 mg/dL — ABNORMAL HIGH (ref 0.50–1.10)
GFR calc Af Amer: 12 mL/min — ABNORMAL LOW (ref >90)
Glucose, Bld: 114 mg/dL — ABNORMAL HIGH (ref 70–99)
Glucose, Bld: 133 mg/dL — ABNORMAL HIGH (ref 70–99)
Phosphorus: 4.2 mg/dL (ref 2.3–4.6)
Potassium: 4.1 mEq/L (ref 3.5–5.1)

## 2011-12-21 LAB — GLUCOSE, CAPILLARY
Glucose-Capillary: 130 mg/dL — ABNORMAL HIGH (ref 70–99)
Glucose-Capillary: 138 mg/dL — ABNORMAL HIGH (ref 70–99)

## 2011-12-21 MED ORDER — HYDROCODONE-ACETAMINOPHEN 5-325 MG PO TABS
1.0000 | ORAL_TABLET | Freq: Four times a day (QID) | ORAL | Status: DC | PRN
  Administered 2011-12-21 – 2011-12-29 (×20): 1 via ORAL
  Filled 2011-12-21 (×21): qty 1

## 2011-12-21 MED ORDER — DARBEPOETIN ALFA-POLYSORBATE 25 MCG/0.42ML IJ SOLN
INTRAMUSCULAR | Status: AC
  Administered 2011-12-21: 25 ug via INTRAVENOUS
  Filled 2011-12-21: qty 0.42

## 2011-12-21 MED ORDER — SENNOSIDES-DOCUSATE SODIUM 8.6-50 MG PO TABS
1.0000 | ORAL_TABLET | Freq: Two times a day (BID) | ORAL | Status: DC
  Administered 2011-12-21 – 2012-01-04 (×25): 1 via ORAL
  Filled 2011-12-21 (×25): qty 1

## 2011-12-21 MED ORDER — ACETAMINOPHEN 325 MG PO TABS
ORAL_TABLET | ORAL | Status: AC
  Filled 2011-12-21: qty 2

## 2011-12-21 MED ORDER — PARICALCITOL 5 MCG/ML IV SOLN
INTRAVENOUS | Status: AC
  Administered 2011-12-21: 8 ug via INTRAVENOUS
  Filled 2011-12-21: qty 2

## 2011-12-21 MED ORDER — HYDROCODONE-ACETAMINOPHEN 5-325 MG PO TABS
1.0000 | ORAL_TABLET | Freq: Once | ORAL | Status: AC
  Administered 2011-12-21: 1 via ORAL
  Filled 2011-12-21: qty 1

## 2011-12-21 NOTE — Progress Notes (Signed)
Patient ID: Faith [name], female   DOB: 1955/03/10, 56 y.o.   MRN: [id number]  [facility] Progress Note    Subjective:   Decreased appetite and c/o nausea/vomiting this am   Objective:   BP 104/50  Pulse 85  Temp 99 F (37.2 C) (Oral)  Resp 16  Wt 72.8 kg (160 lb 7.9 oz)  SpO2 98%  Physical Exam: Gen:WD WN AAF in NAD CVS:no rub Resp:CTA WUJ:WJXBJY Ext:s/p RBKA, RUE AVG +T/B  Labs: BMET  Lab 12/21/11 0605 12/19/11 1627 12/17/11 1336 12/16/11 1555  NA 132* 132* 134* 131*  K 4.5 4.5 3.9 3.9  CL 90* 95* 95* 91*  CO2 28 27 30 26   GLUCOSE 114* 114* 135* 170*  BUN 20 25* 15 28*  CREATININE 4.47* 5.13* 3.80* 6.06*  ALBUMIN 2.9* 2.7* 2.4* 2.4*  CALCIUM 11.1* 10.8* 10.3 9.5  PHOS 4.2 4.1 3.5 4.9*   CBC  Lab 12/21/11 0605 12/19/11 1627 12/17/11 1330 12/16/11 1555  WBC 9.3 9.9 11.9* 13.2*  NEUTROABS -- -- -- --  HGB 10.6* 9.3* 8.6* 8.9*  HCT 35.9* 30.2* 27.4* 28.8*  MCV 83.3 82.5 81.3 81.1  PLT 427* 379 356 313    @IMGRELPRIORS @ Medications:      . aspirin EC  81 mg Oral Daily  . cinacalcet  30 mg Oral Daily  . cinacalcet  90 mg Oral QPM  . darbepoetin (ARANESP) injection - DIALYSIS  25 mcg Intravenous Q Wed-HD  . ezetimibe  10 mg Oral Daily  . feeding supplement  30 mL Oral BID WC  . fenofibrate  54 mg Oral Daily  . gabapentin  300 mg Oral QHS  . insulin aspart  0-9 Units Subcutaneous TID WC  . insulin aspart  3 Units Subcutaneous TID WC  . insulin glargine  6 Units Subcutaneous QHS  . multivitamin  1 tablet Oral QHS  . pantoprazole  40 mg Oral Q1200  . paricalcitol  8 mcg Intravenous Q M,W,F-HD  . senna-docusate  1 tablet Oral BID  . sevelamer  2,400 mg Oral TID WC  . warfarin  0.5 mg Oral ONCE-1800  . Warfarin - Pharmacist Dosing Inpatient   Does not apply q1800  . DISCONTD: polyethylene glycol  17 g Oral Daily     Assessment/ Plan:   1. S/p right BKA - rehab in process; tight heparin with HD; also on coumadin for DVT prophylaxis    2. ESRD - MWF per routine on 2 K 2 Ca bath; check labs pre HD. New EDW ~71-71.5kg  3. Anemia - Hgb decreased post op - check Fe studies today. Continue aranesp; if Hgb < 8,5 increase Aranesp to 60 today.  4. Secondary hyperparathyroidism - last corrected Ca > than 11; on decreased zemplar down to 8 from 18 ; on 2 Ca bath; to check labs pre HD; continue sensipar and binders; iPTH 500s.  5. HTN/volume - previously on norvasc 5 at HS; none at present; lower EDW  6. Nutrition - poor po intake and c/o early satiety and N/V.  Likely diabetic gastroparesis but no reglan due to tardive dyskinesia.  Consider GI eval, calorie count, dietician eval.  albumin low on high protein renal diet; add prostat bid  7. DM - per primary  8. Dispo- per CIR  Rainee Sweatt A 12/21/2011, 10:01 AM

## 2011-12-21 NOTE — Progress Notes (Signed)
Patient had small unmeasured emesis occurrence this am, yellow bile colored. CBG 130, V/S WDL. Bowels moving regularly, LBM 10/30. Patient received sorbitol 30 cc on night shift last night. Patient states she thinks that's what made her sick. Pam Love notified, sorbitol discontinued and senna added to keep bowels moving normally. Patient is participating in therapies and states that she feels better now. Dialysis is scheduled for today. Faith [name]

## 2011-12-21 NOTE — Progress Notes (Signed)
Social Work Patient ID: Faith [name], female   DOB: December 17, 1955, 56 y.o.   MRN: [id number] Met with pt and left a message for husband regarding team conference goals-min level and discharge 11/13.  Also spoke with Judithann Sauger from [employer] regarding these  Recommendations.  She reports she will take recommendations into morning meeting and get back with this worker.  Aware of 24 hour care recommendation and needing manuel Wheelchair.  Pt reports: " I am making some progress, but it is slow."  Pt would like for it to be faster.  Continue to work toward discharge date and make arrangements.

## 2011-12-21 NOTE — Plan of Care (Signed)
Problem: RH SKIN INTEGRITY Goal: RH STG SKIN FREE OF INFECTION/BREAKDOWN Mod assist of caregiver to remain free of infection/breakdown  Outcome: Not Progressing Skin tear noted on the sacrum area. Cleanse the site. Allevyn dressing applied.

## 2011-12-21 NOTE — Progress Notes (Signed)
Physical Therapy Session Note  Patient Details  Name: Faith [name] MRN: [id number] Date of Birth: [birth year]  Today's Date: 12/21/2011 Time: 1130-1200 Time Calculation (min): 30 min  Short Term Goals: Week 1:  PT Short Term Goal 1 (Week 1): pt will move supine > sit with mod assist in flat bed without rails PT Short Term Goal 2 (Week 1): pt will transfer bed>< w/c with mod assist PT Short Term Goal 3 (Week 1): pt will propel w/c x 58' with supervision PT Short Term Goal 4 (Week 1): pt will manipulate w/c parts for transfer with mod assist      Skilled Therapeutic Interventions/Progress Updates:   Pt being given additional pain meds by RN due to R phantom pain.  HOB raised, with railing, supine> sit with min assist.  Transfer training working on scooting to L on bed with min assist; also on forward wt shift, LLE wt bearing for hip elevation, x 4 with max assist.  Pt does not transfer wt to LLE, and elevates hips <1/2" even with assistance.  Pt confused about placement of SB at first, but with min assist for initiation for L hand use to do this, she placed board, and scooted across slowly, slightly downhill bed> w/c to L with min assist.  W/c mobility using bil hands, L foot x 50' with 2 brief rest breaks, with supervision. Turned L with min assist, propelled x 25' with poor use of LLE due to fatigue.  Pt more conversational and participatory today, despite report of increased RLE pain, crying earlier this AM.  Pain report 2/10 during session.          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) Required Braces or Orthoses: Knee Immobilizer - Right Knee Immobilizer - Right: On except when in CPM Restrictions Weight Bearing Restrictions: Yes RLE Weight Bearing: Non weight bearing (RLE)   Pain: Pain Assessment Pain Assessment: 0-10 Pain Score:   2 Pain Type: Surgical pain Pain  Location: Leg Pain Orientation: Right (surgical and phantom) Pain Descriptors: Aching Pain Frequency: Occasional Pain Onset: Gradual Patients Stated Pain Goal: 2 Pain Intervention(s): Medication (See eMAR);RN made aware Multiple Pain Sites: No       See FIM for current functional status  Therapy/Group: Individual Therapy  Faith [name] 12/21/2011, 12:19 PM

## 2011-12-21 NOTE — Progress Notes (Signed)
Patient ID: Faith [name], female   DOB: [birth year], 56 y.o.   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 Lajoyce Corners. Post op started on coumadin for VTE prophylaxis. KI ordered to help keep knee in extension.  ESRD on HD  Subjective/Complaints:  Poor appetite, I get full easily       Objective: Vital Signs: Blood pressure 104/50, pulse 85, temperature 99 F (37.2 C), temperature source Oral, resp. rate 16, weight 72.8 kg (160 lb 7.9 oz), SpO2 98.00%. No results found. Results for orders placed during the hospital encounter of 12/15/11 (from the past 72 hour(s))  GLUCOSE, CAPILLARY     Status: Abnormal   Collection Time   12/18/11 11:31 AM      Component Value Range Comment   Glucose-Capillary 130 (*) 70 - 99 mg/dL    Comment 1 Notify RN     GLUCOSE, CAPILLARY     Status: Abnormal   Collection Time   12/18/11  5:09 PM      Component Value Range Comment   Glucose-Capillary 150 (*) 70 - 99 mg/dL   GLUCOSE, CAPILLARY     Status: Abnormal   Collection Time   12/18/11  9:20 PM      Component Value Range Comment   Glucose-Capillary 109 (*) 70 - 99 mg/dL    Comment 1 Notify RN     PROTIME-INR     Status: Abnormal   Collection Time   12/19/11  5:00 AM      Component Value Range Comment   Prothrombin Time 28.0 (*) 11.6 - 15.2 seconds    INR 2.79 (*) 0.00 - 1.49   GLUCOSE, CAPILLARY     Status: Normal   Collection Time   12/19/11  7:42 AM      Component Value Range Comment   Glucose-Capillary 96  70 - 99 mg/dL    Comment 1 Notify RN     GLUCOSE, CAPILLARY     Status: Abnormal   Collection Time   12/19/11 11:42 AM      Component Value Range Comment   Glucose-Capillary 133 (*) 70 - 99 mg/dL    Comment 1 Notify RN     CBC     Status: Abnormal   Collection Time   12/19/11  4:27 PM      Component Value Range Comment   WBC 9.9  4.0 - 10.5 K/uL    RBC 3.66 (*) 3.87 - 5.11 MIL/uL    Hemoglobin 9.3 (*) 12.0 - 15.0 g/dL    HCT 40.9 (*) 81.1 - 46.0 %    MCV 82.5  78.0 - 100.0 fL    MCH 25.4 (*) 26.0 - 34.0 pg    MCHC 30.8  30.0 - 36.0 g/dL    RDW 91.4 (*) 78.2 - 15.5 %    Platelets 379  150 - 400 K/uL   RENAL FUNCTION PANEL     Status: Abnormal   Collection Time   12/19/11  4:27 PM      Component Value Range Comment   Sodium 132 (*) 135 - 145 mEq/L    Potassium 4.5  3.5 - 5.1 mEq/L    Chloride 95 (*) 96 - 112 mEq/L    CO2 27  19 - 32 mEq/L    Glucose, Bld 114 (*)  70 - 99 mg/dL    BUN 25 (*) 6 - 23 mg/dL    Creatinine, Ser 4.09 (*) 0.50 - 1.10 mg/dL    Calcium 81.1 (*) 8.4 - 10.5 mg/dL    Phosphorus 4.1  2.3 - 4.6 mg/dL    Albumin 2.7 (*) 3.5 - 5.2 g/dL    GFR calc non Af Amer 8 (*) >90 mL/min    GFR calc Af Amer 10 (*) >90 mL/min   FERRITIN     Status: Abnormal   Collection Time   12/19/11  4:27 PM      Component Value Range Comment   Ferritin 2280 (*) 10 - 291 ng/mL   IRON AND TIBC     Status: Abnormal   Collection Time   12/19/11  4:27 PM      Component Value Range Comment   Iron 17 (*) 42 - 135 ug/dL    TIBC 914 (*) 782 - 956 ug/dL    Saturation Ratios 10 (*) 20 - 55 %    UIBC 151  125 - 400 ug/dL   GLUCOSE, CAPILLARY     Status: Normal   Collection Time   12/19/11  9:13 PM      Component Value Range Comment   Glucose-Capillary 98  70 - 99 mg/dL   PROTIME-INR     Status: Abnormal   Collection Time   12/20/11  6:35 AM      Component Value Range Comment   Prothrombin Time 29.4 (*) 11.6 - 15.2 seconds    INR 2.98 (*) 0.00 - 1.49   GLUCOSE, CAPILLARY     Status: Normal   Collection Time   12/20/11  7:28 AM      Component Value Range Comment   Glucose-Capillary 99  70 - 99 mg/dL    Comment 1 Notify RN     GLUCOSE, CAPILLARY     Status: Abnormal   Collection Time   12/20/11 11:38 AM      Component Value  Range Comment   Glucose-Capillary 115 (*) 70 - 99 mg/dL    Comment 1 Notify RN     GLUCOSE, CAPILLARY     Status: Abnormal   Collection Time   12/20/11  4:34 PM      Component Value Range Comment   Glucose-Capillary 122 (*) 70 - 99 mg/dL   GLUCOSE, CAPILLARY     Status: Normal   Collection Time   12/20/11  9:20 PM      Component Value Range Comment   Glucose-Capillary 86  70 - 99 mg/dL   PROTIME-INR     Status: Abnormal   Collection Time   12/21/11  6:05 AM      Component Value Range Comment   Prothrombin Time 34.7 (*) 11.6 - 15.2 seconds    INR 3.73 (*) 0.00 - 1.49   RENAL FUNCTION PANEL     Status: Abnormal   Collection Time   12/21/11  6:05 AM      Component Value Range Comment   Sodium 132 (*) 135 - 145 mEq/L    Potassium 4.5  3.5 - 5.1 mEq/L    Chloride 90 (*) 96 - 112 mEq/L    CO2 28  19 - 32 mEq/L    Glucose, Bld 114 (*) 70 - 99 mg/dL    BUN 20  6 - 23 mg/dL    Creatinine, Ser 2.13 (*) 0.50 - 1.10 mg/dL    Calcium 08.6 (*) 8.4 - 10.5 mg/dL  Phosphorus 4.2  2.3 - 4.6 mg/dL    Albumin 2.9 (*) 3.5 - 5.2 g/dL    GFR calc non Af Amer 10 (*) >90 mL/min    GFR calc Af Amer 12 (*) >90 mL/min   CBC     Status: Abnormal   Collection Time   12/21/11  6:05 AM      Component Value Range Comment   WBC 9.3  4.0 - 10.5 K/uL    RBC 4.31  3.87 - 5.11 MIL/uL    Hemoglobin 10.6 (*) 12.0 - 15.0 g/dL    HCT 16.1 (*) 09.6 - 46.0 %    MCV 83.3  78.0 - 100.0 fL    MCH 24.6 (*) 26.0 - 34.0 pg    MCHC 29.5 (*) 30.0 - 36.0 g/dL    RDW 04.5 (*) 40.9 - 15.5 %    Platelets 427 (*) 150 - 400 K/uL   GLUCOSE, CAPILLARY     Status: Abnormal   Collection Time   12/21/11  7:47 AM      Component Value Range Comment   Glucose-Capillary 130 (*) 70 - 99 mg/dL      HEENT: normal Cardio: RRR Resp: Rales GI: BS positive and Distention Extremity:  No Edema Skin:   Other R BKA with dressing and immobilizer in PT Neuro: Alert/Oriented, Abnormal Sensory absent proprioception, absent lt touch L  foot, Abnormal Motor 2-/5 R ankle DF/PF, Dysarthric and Other tardive dyskinesia lip smacking Musc/Skel:  Other R BKA, staples intact no drainage, dark incision edges, mild edema Gen: NAD   Assessment/Plan: 1. Functional deficits secondary to R BKA, DM with neuropathy which require 3+ hours per day of interdisciplinary therapy in a comprehensive inpatient rehab setting. Physiatrist is providing close team supervision and 24 hour management of active medical problems listed below. Physiatrist and rehab team continue to assess barriers to discharge/monitor patient progress toward functional and medical goals. FIM: FIM - Bathing Bathing Steps Patient Completed: Chest;Right Arm;Left Arm;Abdomen;Front perineal area;Buttocks;Right upper leg;Left upper leg;Left lower leg (including foot) Bathing: 3: Mod-Patient completes 5-7 3f 10 parts or 50-74%  FIM - Upper Body Dressing/Undressing Upper body dressing/undressing steps patient completed: Thread/unthread right sleeve of pullover shirt/dresss;Thread/unthread left sleeve of pullover shirt/dress;Put head through opening of pull over shirt/dress;Pull shirt over trunk Upper body dressing/undressing: 5: Set-up assist to: Obtain clothing/put away FIM - Lower Body Dressing/Undressing Lower body dressing/undressing steps patient completed: Thread/unthread right underwear leg;Thread/unthread left underwear leg;Pull underwear up/down;Thread/unthread right pants leg;Thread/unthread left pants leg;Pull pants up/down;Don/Doff left sock Lower body dressing/undressing: 1: Total-Patient completed less than 25% of tasks  FIM - Toileting Toileting steps completed by patient: Performs perineal hygiene Toileting Assistive Devices: Grab bar or rail for support Toileting: 2: Max-Patient completed 1 of 3 steps  FIM - Diplomatic Services operational officer Devices: Bedside commode;Sliding board;Grab bars Toilet Transfers: 1-Two helpers  FIM - Physiological scientist Devices: Bed rails;Arm rests Bed/Chair Transfer: 1: Two helpers  FIM - Locomotion: Wheelchair Distance: 150 Locomotion: Wheelchair: 1: Travels less than 50 ft with supervision, cueing or coaxing FIM - Locomotion: Ambulation Locomotion: Ambulation: 0: Activity did not occur  Comprehension Comprehension Mode: Auditory Comprehension: 5-Follows basic conversation/direction: With extra time/assistive device  Expression Expression Mode: Verbal Expression: 5-Expresses basic needs/ideas: With no assist  Social Interaction Social Interaction Mode: Asleep Social Interaction: 5-Interacts appropriately 90% of the time - Needs monitoring or encouragement for participation or interaction.  Problem Solving Problem Solving Mode: Asleep Problem Solving: 5-Solves basic problems:  With no assist  Memory Memory Mode: Asleep Memory: 6-More than reasonable amt of time  Medical Problem List and Plan:  1. DVT Prophylaxis/Anticoagulation: Pharmaceutical: Coumadin  2. Pain Management: monitor on prn Medication for now.Reduce gabapentin to 300mg   3. Mood: difficult to judge. Will monitor for now and have LCSW follow for evaluation.  4. Neuropsych: This patient is capable of making decisions on his/her own behalf.  5. 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 SSI for elevated BS.  6. ESRD: HD M, W, F. Daily weights. Renal diet.  7. Low grade fevers: resolved monitor 8.  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  Multiple small meals   LOS (Days) 6 A FACE TO FACE EVALUATION WAS PERFORMED  KIRSTEINS,ANDREW E 12/21/2011, 8:34 AM

## 2011-12-21 NOTE — Progress Notes (Signed)
Physical Therapy Session Note  Patient Details  Name: Faith [name] MRN: [id number] Date of Birth: [birth year]  Today's Date: 12/21/2011 Time: 4098-1191 Time Calculation (min): 57 min  Short Term Goals: Week 1:  PT Short Term Goal 1 (Week 1): pt will move supine > sit with mod assist in flat bed without rails PT Short Term Goal 2 (Week 1): pt will transfer bed>< w/c with mod assist PT Short Term Goal 3 (Week 1): pt will propel w/c x 4' with supervision PT Short Term Goal 4 (Week 1): pt will manipulate w/c parts for transfer with mod assist  Skilled Therapeutic Interventions/Progress Updates:   Patient reports continued phantom pain but reports premedicating; patient performed w/c parts management and w/c mobility on unit x 100' with supervision but max verbal cues for initiation and sequencing; Sp02 on RA after propulsion: 98%.  Performed transfers w/c <> mat with slideboard and mod A overall with verbal and tactile cues for LUE and LLE placement to assist with pushing to advance buttocks and to maintain anterior lean.  Sit > supine on mat with mod A.  Attempted mirror therapy in supine to minimize RLE phantom but patient unable to attend to image in mirror or attend to activity secondary to internally distracted by pain.  Performed supine LLE strengthening exercises with gravity and friction minimized during 10 reps LLE hip IR, ABD, heel slides, ankle pumps and glute sets with rest breaks for deep breathing and to assess Sp02: 94-97% with deep breathing.    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) Required Braces or Orthoses: Knee Immobilizer - Right Knee Immobilizer - Right: On except when in CPM Restrictions Weight Bearing Restrictions: Yes RLE Weight Bearing: Non weight bearing (RLE) Vital Signs: Oxygen Therapy SpO2: 98 % O2 Device: None (Room air) Pulse Oximetry Type:  Intermittent Pain: Pain Assessment Pain Assessment: Faces Pain Score:   2 Faces Pain Scale: Hurts even more Pain Type: Phantom pain;Surgical pain Pain Location: Leg Pain Orientation: Right Pain Descriptors: Other (Comment) (phantom) Pain Frequency: Occasional Pain Onset: On-going Patients Stated Pain Goal: 2 Pain Intervention(s): Other (Comment) (premedicated, mirror therapy) Multiple Pain Sites: No  See FIM for current functional status  Therapy/Group: Individual Therapy  Edman Circle [facility] 12/21/2011, 2:34 PM

## 2011-12-21 NOTE — Progress Notes (Signed)
Occupational Therapy Session Note  Patient Details  Name: Faith [name] MRN: [id number] Date of Birth: Dec 02, 1955  Today's Date: 12/21/2011 Time: 4098-1191 Time Calculation (min): 75 min  Short Term Goals: Week 1:  OT Short Term Goal 1 (Week 1): Pt will increase UB dressing to Supervision. OT Short Term Goal 2 (Week 1): Pt will increase bathing to Mod Assist. (LB at bed level. UB at sink). OT Short Term Goal 3 (Week 1): Pt will increase LB dressing to Mod Assist at bed level. OT Short Term Goal 4 (Week 1): Pt will increase toileting to Max Assist. OT Short Term Goal 5 (Week 1): Pt will increase toilet transfer to Max Assist with slideboard.  Skilled Therapeutic Interventions/Progress Updates:  ADL re-training completed EOB for UB bathing and dressing and supine in bed for LB bathing and dressing this AM! Nurse tech was attempting to stand patient at bedside when therapy arrived. Educated patient that patient uses a slideboard and does not stand presently.  Session with focus on bathing, dressing, sitting balance, slideboard transfers, and bed mobility. Pt still requires Total Assist for LB dressing. When finished with dressing in bed, patient performed slideboard transfer with supervision to w/c. Therapist set-up slideboard and prevented it from moving while patient scooted to w/c.   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) Required Braces or Orthoses: Knee Immobilizer - Right Knee Immobilizer - Right: On except when in CPM Restrictions Weight Bearing Restrictions: Yes RLE Weight Bearing: Non weight bearing Pain: Pain Assessment Pain Assessment: 0-10 Pain Score:   6 Pain Type: Surgical pain Pain Location: Leg Pain Orientation: Right Pain Descriptors: Aching Pain Frequency: Occasional Pain Onset: Gradual Patients Stated Pain Goal: 2 Pain Intervention(s): Medication (See eMAR) Multiple  Pain Sites: No  See FIM for current functional status  Therapy/Group: Individual Therapy  Limmie Patricia, OTR/L 12/21/2011, 10:49 AM

## 2011-12-21 NOTE — Plan of Care (Signed)
Problem: RH SKIN INTEGRITY Goal: RH STG SKIN FREE OF INFECTION/BREAKDOWN Mod assist of caregiver to remain free of infection/breakdown  Outcome: Not Progressing New skin tear to sacrum r/t cleaning patient after incontinence, per patient. Allevyn dressing applied.

## 2011-12-21 NOTE — Progress Notes (Signed)
Occupational Therapy Session Note  Patient Details  Name: Faith [name] MRN: [id number] Date of Birth: 1955/08/18  Today's Date: 12/21/2011 Time: 1415-1500 Time Calculation (min): 45 min  Short Term Goals: Week 1:  OT Short Term Goal 1 (Week 1): Pt will increase UB dressing to Supervision. OT Short Term Goal 2 (Week 1): Pt will increase bathing to Mod Assist. (LB at bed level. UB at sink). OT Short Term Goal 3 (Week 1): Pt will increase LB dressing to Mod Assist at bed level. OT Short Term Goal 4 (Week 1): Pt will increase toileting to Max Assist. OT Short Term Goal 5 (Week 1): Pt will increase toilet transfer to Max Assist with slideboard.  Skilled Therapeutic Interventions/Progress Updates:  Patient resting in w/c upon arrival stating "I'm so tired".  Self care retraining to include slide board transfer w/c>bed, undressing, bathe peri area, dressing into underware and hospital gown to prepare for dialysis (per patient request).  Focus session on safe techniques for uphill slide board transfer with vcs for hand placement and weight shifts, bed mobility lateral leans for pants down, rolling to change underware and clean up, supine>sit EOB with rails ans supervision to take pain medication.  Patient declined to wear knee Immobilizer while in bed, "It is too uncomfortable".  After review of need for KI, patient still declined yet states she understands need for KI.  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) Required Braces or Orthoses: Knee Immobilizer - Right Knee Immobilizer - Right: On except when in CPM Restrictions Weight Bearing Restrictions: Yes RLE Weight Bearing: Non weight bearing (RLE) Pain: Pain Assessment Pain Assessment: 0-10 Pain Score:   4 Faces Pain Scale: Hurts even more Pain Type: Surgical pain Pain Location: Leg Pain Orientation: Right Pain Descriptors: Sore Pain  Frequency: Intermittent Pain Onset: Gradual Patients Stated Pain Goal: 2 Pain Intervention(s): Medication (See eMAR) Multiple Pain Sites: No  See FIM for current functional status  Therapy/Group: Individual Therapy  Faith [name] 12/21/2011, 3:04 PM

## 2011-12-21 NOTE — Progress Notes (Signed)
ANTICOAGULATION CONSULT NOTE - Follow Up Consult  Pharmacy Consult:  Coumadin Indication: VTE prophylaxis  Allergies  Allergen Reactions  . Metoclopramide Hcl Other (See Comments)    Tardive dyskinesia  . Codeine Nausea And Vomiting  . Lipitor (Atorvastatin Calcium) Other (See Comments)    Muscle weakness    Patient Measurements: Weight: 160 lb 7.9 oz (72.8 kg)  Vital Signs: Temp: 99 F (37.2 C) (10/30 0628) Temp src: Oral (10/30 0628) BP: 104/50 mmHg (10/30 0628) Pulse Rate: 85  (10/30 0628)  Labs:  Basename 12/21/11 0605 12/20/11 0635 12/19/11 1627 12/19/11 0500  HGB 10.6* -- 9.3* --  HCT 35.9* -- 30.2* --  PLT 427* -- 379 --  APTT -- -- -- --  LABPROT 34.7* 29.4* -- 28.0*  INR 3.73* 2.98* -- 2.79*  HEPARINUNFRC -- -- -- --  CREATININE 4.47* -- 5.13* --  CKTOTAL -- -- -- --  CKMB -- -- -- --  TROPONINI -- -- -- --    The CrCl is unknown because both a height and weight (above a minimum accepted value) are required for this calculation.     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  Coumadin was held on 10/23 through 10/25 due to a rapid increase in INR after Coumadin 5mg  x 2 on 10/21 and 10/22.   Patient did have elevated baseline INR.  Coumadin resumed 10/26 at a lower dose and INR is now supratherapeutic again.  This may be influenced by poor PO intake and nausea/vomiting.   Goal of Therapy:  INR 2-3    Plan:  No Coumadin today. Daily PT/INR  Estella Husk, Pharm.D., BCPS Clinical Pharmacist  Phone [phone] Pager [phone] 12/21/2011, 12:28 PM

## 2011-12-22 ENCOUNTER — Inpatient Hospital Stay (HOSPITAL_COMMUNITY): Payer: Medicare (Managed Care) | Admitting: Speech Pathology

## 2011-12-22 ENCOUNTER — Inpatient Hospital Stay (HOSPITAL_COMMUNITY): Payer: Medicare (Managed Care) | Admitting: *Deleted

## 2011-12-22 ENCOUNTER — Inpatient Hospital Stay (HOSPITAL_COMMUNITY): Payer: Medicare (Managed Care)

## 2011-12-22 ENCOUNTER — Inpatient Hospital Stay (HOSPITAL_COMMUNITY): Payer: Medicare Other | Admitting: *Deleted

## 2011-12-22 LAB — GLUCOSE, CAPILLARY
Glucose-Capillary: 80 mg/dL (ref 70–99)
Glucose-Capillary: 121 mg/dL — ABNORMAL HIGH (ref 70–99)

## 2011-12-22 MED ORDER — HEPARIN SODIUM (PORCINE) 1000 UNIT/ML DIALYSIS
20.0000 [IU]/kg | INTRAMUSCULAR | Status: DC | PRN
  Filled 2011-12-22: qty 2

## 2011-12-22 MED ORDER — ASPIRIN 81 MG PO CHEW
CHEWABLE_TABLET | ORAL | Status: AC
  Filled 2011-12-22: qty 1

## 2011-12-22 MED ORDER — WARFARIN 0.5 MG HALF TABLET
0.5000 mg | ORAL_TABLET | Freq: Once | ORAL | Status: AC
  Administered 2011-12-22: 0.5 mg via ORAL
  Filled 2011-12-22: qty 1

## 2011-12-22 NOTE — Progress Notes (Signed)
Physical Therapy Session Note  Patient Details  Name: Faith [name] MRN: [id number] Date of Birth: Aug 10, 1955  Today's Date: 12/22/2011 Time: 1100-1200 Time Calculation (min): 60 min  Short Term Goals: Week 1:  PT Short Term Goal 1 (Week 1): pt will move supine > sit with mod assist in flat bed without rails PT Short Term Goal 2 (Week 1): pt will transfer bed>< w/c with mod assist PT Short Term Goal 3 (Week 1): pt will propel w/c x 39' with supervision PT Short Term Goal 4 (Week 1): pt will manipulate w/c parts for transfer with mod assist     Skilled Therapeutic Interventions/Progress Updates:   W/c mobility using bil hands x 80' with supervision, cues to steer and for route finding  W/c parts management for w/c prep sequence and movements with tactile and VCs   Dynamic sitting balance with min assist for reaching slightly out of BOS to L, stand-by for reaching out of BOS to R, with RLE supported on stool in front.  Mirror therapy for RLE phantom pain, with neuromuscular re-education L hip flexion, abduction/adduction, knee extension; pt reported phantom sensation, but no pain by end of session.  Bil UE and LLE neuro re-ed on 21" high mat, using press up blocks, x 10 focusing on elevating hips.  Mat> w/c with set-up, VCs for SB transfer to L.  Pt 's affect brighter today, and pain was not a limitation for participation in tx.    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

## 2011-12-22 NOTE — Progress Notes (Signed)
I have read and agree with treatment note.  Clydene Laming, PT

## 2011-12-22 NOTE — Progress Notes (Signed)
Pharmacy Consult-Anticoagulation  Pharmacy Consult:  56 y/o female is currently on Coumadin for VTE prophylaxis .   Current Labs: Hematology  Lab Results  Component Value Date   INR 2.98* 12/22/2011   INR 4.44* 12/21/2011   INR 3.73* 12/21/2011   Current Meds:    acetaminophen     aspirin EC 81 mg Oral Daily  cinacalcet 30 mg Oral Daily  cinacalcet 90 mg Oral QPM  darbepoetin (ARANESP) injection - DIALYSIS 25 mcg Intravenous Q Wed-HD  ezetimibe 10 mg Oral Daily  feeding supplement 30 mL Oral BID WC  fenofibrate 54 mg Oral Daily  gabapentin 300 mg Oral QHS  insulin aspart 0-9 Units Subcutaneous TID WC  insulin aspart 3 Units Subcutaneous TID WC  insulin glargine 6 Units Subcutaneous QHS  multivitamin 1 tablet Oral QHS  pantoprazole 40 mg Oral Q1200  paricalcitol 8 mcg Intravenous Q M,W,F-HD  senna-docusate 1 tablet Oral BID  sevelamer 2,400 mg Oral TID WC  Warfarin - Pharmacist Dosing Inpatient  Does not apply q1800    Assessment:  INR is down today after no Coumadin dose yesterday.  No bleeding complications observed.  Last HGB 9.7 [stable]  Goal/Plan:  INR goal is 2-3.  Will give low dose Coumadin today, 0.5 mg x 1.  Daily INR's, CBC. Valinda Party, Elisha Headland, Pharm.D. 12/22/2011  4:11 PM

## 2011-12-22 NOTE — Evaluation (Signed)
Speech Language Pathology Assessment and Plan  Patient Details  Name: Faith [name] MRN: [id number] Date of Birth: 1956-01-08  SLP Diagnosis: Cognitive Impairments  Rehab Potential: Fair ELOS: 2.5 weeks, anticipated d/c 11/13   Today's Date: 12/22/2011 Time: 1000-1100 Time Calculation (min): 60 min  Problem List:  Patient Active Problem List  Diagnosis  . ESRD (end stage renal disease) on dialysis  . Diabetes mellitus, type 2  . Secondary hyperparathyroidism  . CAD (coronary artery disease) of artery bypass graft  . PAD (peripheral artery disease)  . Chronic constipation  . GERD (gastroesophageal reflux disease)  . Anemia of chronic renal failure  . Tardive dyskinesia  . CVA (cerebral infarction)  . Depression  . Neuropathy  . Diabetic retinopathy associated with type 2 diabetes mellitus  . Diastolic CHF, chronic  . History of Osteomyelitis of toe of left foot  . End stage renal disease  . Osteomyelitis of toe of right foot  . Unilateral complete BKA-right   Past Medical History:  Past Medical History  Diagnosis Date  . Hyperparathyroidism, secondary renal   . CAD (coronary artery disease) of artery bypass graft     CABG in 2002  . PAD (peripheral artery disease)     Not a candidate for revascularization  . Chronic constipation   . GERD (gastroesophageal reflux disease)   . Anemia of chronic renal failure   . Tardive dyskinesia     Secondary to Reglan  . Depression   . Neuropathy   . Diabetic retinopathy(362.0)   . Diastolic CHF, chronic     Grade 2  . Pulmonary embolism 06/2003  . Hyperlipidemia   . Myocardial infarction 2002  . Stroke 2002    "w/left side" residual weakness  . ESRD on dialysis 01/17/11    [name]; M, W, F; via right upper arm AVF  . Diabetes mellitus     type II  . CHF (congestive heart failure)   . CVA (cerebral infarction)     right brain with minimal left hemiparesis  . Pneumonia 10/2009; 11/2009    ?pneumonia 01/17/11  .  Hypertension   . Arthritis   . Complication of anesthesia     "sleepy afterwards; due to her being dialysis pt"   Past Surgical History:  Past Surgical History  Procedure Date  . Coronary artery bypass graft 2002  . Laparoscopic cholecystectomy 2002  . Av fistula placement     times 4-using rt upper arm graft-old rt lower arm,lt upper and lower old grafts  . Trigger finger release   . Tubal ligation   . Eye examination under anesthesia w/ retinal cryotherapy and retinal laser   . Cataract extraction 07/2010    right sided   . Toe amputation 5/12    left 2nd toe secondary to osteomyelitis  . Breast biopsy 06/2010  . Carpal tunnel release 05/05/2011    Procedure: CARPAL TUNNEL RELEASE;  Surgeon: Sharma Covert, MD;  Location: [facility];  Service: Orthopedics;  Laterality: Right;  . Coronary angioplasty with stent placement   . Amputation 11/24/2011    Procedure: AMPUTATION RAY;  Surgeon: Nadara Mustard, MD;  Location: [facility] OR;  Service: Orthopedics;  Laterality: Right;  Right Foot 2nd Ray Amputation  . Amputation 12/12/2011    Procedure: AMPUTATION BELOW KNEE;  Surgeon: Nadara Mustard, MD;  Location: [facility] OR;  Service: Orthopedics;  Laterality: Right;    Assessment / Plan / Recommendation Clinical Impression  Patient is a 56 year old woman  with diabetes with diabetic nephropathy and retinopathy, right foot abscess with osteomyelitis and second ray amputation who developed progressive gangrenous changes of her forefoot with fever and pain. 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 Lajoyce Corners. Post op started on coumadin for VTE prophylaxis. KI ordered to help keep knee in extension. She did have increased lethargy with confusion.  Therapy team recommended CIR.  Patient admitted to [facility] 12/15/11.  SLP evaluation ordered to assess patient's cognition.  Cognitive-linguistic evaluation revealed decreased recall of new information, decreased problem solving for basic and complex tasks, and an  emergent awareness impairment.  Patient required Mod A verbal cues to recall currently scheduled medication.  Patient also required Mod A verbal and visual cues to utilize external memory aids to utilize external aids to facilitate increased recall of new information.  Patient also required Mod A cues to verbally solve basic problems.  Patient could identify physical and some cognitive deficits; however, she needed Mod A cuing to compensate for these deficits when experiencing difficulty.  Overall, patient would benefit from skilled SLP services for cognition during CIR stay to maximize functional independence and reduce burden of care upon discharge.      SLP Assessment  Patient will need skilled Speech Lanaguage Pathology Services during CIR admission    Recommendations  Follow up Recommendations: 24 hour supervision/assistance Equipment Recommended: None recommended by SLP    SLP Frequency 5 out of 7 days;1-2 X/day, 30-60 minutes   SLP Treatment/Interventions Cognitive remediation/compensation;Functional tasks;Environmental controls;Internal/external aids;Patient/family education;Therapeutic Activities    Pain Pain Assessment Pain Assessment: No/denies pain  Prior Functioning Type of Home: House Lives With: Spouse Available Help at Discharge: Personal care attendant;Available PRN/intermittently;Family;Other (Comment) (PACE program) Vocation: On disability  Short Term Goals: Week 1: SLP Short Term Goal 1 (Week 1): Patient will utilize external aids to facilitate increased recall of new information with Min A visual and verbal cuing.   SLP Short Term Goal 2 (Week 1): Patient will demonstrate improved emergent awareness by asking for help when needed with Min A verbal cuing. SLP Short Term Goal 3 (Week 1): Patient will solve basic problems during functional tasks with Min A visual and verbal cuing.    See FIM for current functional status Refer to Care Plan for Long Term  Goals  Recommendations for other services: None  Discharge Criteria: Patient will be discharged from SLP if patient refuses treatment 3 consecutive times without medical reason, if treatment goals not met, if there is a change in medical status, if patient makes no progress towards goals or if patient is discharged from hospital.  The above assessment, treatment plan, treatment alternatives and goals were discussed and mutually agreed upon: by patient  Ace Gins  Graduate Clinician Speech Language Pathology   Kiley Torrence, Joni Reining 12/22/2011, 1:21 PM

## 2011-12-22 NOTE — Progress Notes (Signed)
Patient ID: Faith [name], female   DOB: 1955-04-10, 56 y.o.   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 Lajoyce Corners. Post op started on coumadin for VTE prophylaxis. KI ordered to help keep knee in extension.  ESRD on HD  Subjective/Complaints:  Don't know how KI came off last noc      Objective: Vital Signs: Blood pressure 86/56, pulse 68, temperature 99 F (37.2 C), temperature source Oral, resp. rate 18, weight 68.8 kg (151 lb 10.8 oz), SpO2 97.00%. No results found. Results for orders placed during the hospital encounter of 12/15/11 (from the past 72 hour(s))  GLUCOSE, CAPILLARY     Status: Abnormal   Collection Time   12/19/11 11:42 AM      Component Value Range Comment   Glucose-Capillary 133 (*) 70 - 99 mg/dL    Comment 1 Notify RN     CBC     Status: Abnormal   Collection Time   12/19/11  4:27 PM      Component Value Range Comment   WBC 9.9  4.0 - 10.5 K/uL    RBC 3.66 (*) 3.87 - 5.11 MIL/uL    Hemoglobin 9.3 (*) 12.0 - 15.0 g/dL    HCT 78.2 (*) 95.6 - 46.0 %    MCV 82.5  78.0 - 100.0 fL    MCH 25.4 (*) 26.0 - 34.0 pg    MCHC 30.8  30.0 - 36.0 g/dL    RDW 21.3 (*) 08.6 - 15.5 %    Platelets 379  150 - 400 K/uL   RENAL FUNCTION PANEL     Status: Abnormal   Collection Time   12/19/11  4:27 PM      Component Value Range Comment   Sodium 132 (*) 135 - 145 mEq/L    Potassium 4.5  3.5 - 5.1 mEq/L    Chloride 95 (*) 96 - 112 mEq/L    CO2 27  19 - 32 mEq/L    Glucose, Bld 114 (*) 70 - 99 mg/dL    BUN 25 (*) 6 - 23 mg/dL    Creatinine, Ser 5.78 (*) 0.50 - 1.10 mg/dL    Calcium 46.9 (*) 8.4 - 10.5 mg/dL    Phosphorus 4.1  2.3 - 4.6 mg/dL    Albumin 2.7 (*) 3.5 - 5.2 g/dL    GFR calc non Af Amer 8 (*) >90 mL/min    GFR calc Af Amer 10 (*)  >90 mL/min   FERRITIN     Status: Abnormal   Collection Time   12/19/11  4:27 PM      Component Value Range Comment   Ferritin 2280 (*) 10 - 291 ng/mL   IRON AND TIBC     Status: Abnormal   Collection Time   12/19/11  4:27 PM      Component Value Range Comment   Iron 17 (*) 42 - 135 ug/dL    TIBC 629 (*) 528 - 413 ug/dL    Saturation Ratios 10 (*) 20 - 55 %    UIBC 151  125 - 400 ug/dL   GLUCOSE, CAPILLARY     Status: Normal   Collection Time   12/19/11  9:13 PM      Component Value Range Comment   Glucose-Capillary 98  70 - 99  mg/dL   PROTIME-INR     Status: Abnormal   Collection Time   12/20/11  6:35 AM      Component Value Range Comment   Prothrombin Time 29.4 (*) 11.6 - 15.2 seconds    INR 2.98 (*) 0.00 - 1.49   GLUCOSE, CAPILLARY     Status: Normal   Collection Time   12/20/11  7:28 AM      Component Value Range Comment   Glucose-Capillary 99  70 - 99 mg/dL    Comment 1 Notify RN     GLUCOSE, CAPILLARY     Status: Abnormal   Collection Time   12/20/11 11:38 AM      Component Value Range Comment   Glucose-Capillary 115 (*) 70 - 99 mg/dL    Comment 1 Notify RN     GLUCOSE, CAPILLARY     Status: Abnormal   Collection Time   12/20/11  4:34 PM      Component Value Range Comment   Glucose-Capillary 122 (*) 70 - 99 mg/dL   GLUCOSE, CAPILLARY     Status: Normal   Collection Time   12/20/11  9:20 PM      Component Value Range Comment   Glucose-Capillary 86  70 - 99 mg/dL   PROTIME-INR     Status: Abnormal   Collection Time   12/21/11  6:05 AM      Component Value Range Comment   Prothrombin Time 34.7 (*) 11.6 - 15.2 seconds    INR 3.73 (*) 0.00 - 1.49   RENAL FUNCTION PANEL     Status: Abnormal   Collection Time   12/21/11  6:05 AM      Component Value Range Comment   Sodium 132 (*) 135 - 145 mEq/L    Potassium 4.5  3.5 - 5.1 mEq/L    Chloride 90 (*) 96 - 112 mEq/L    CO2 28  19 - 32 mEq/L    Glucose, Bld 114 (*) 70 - 99 mg/dL    BUN 20  6 - 23 mg/dL     Creatinine, Ser 4.54 (*) 0.50 - 1.10 mg/dL    Calcium 09.8 (*) 8.4 - 10.5 mg/dL    Phosphorus 4.2  2.3 - 4.6 mg/dL    Albumin 2.9 (*) 3.5 - 5.2 g/dL    GFR calc non Af Amer 10 (*) >90 mL/min    GFR calc Af Amer 12 (*) >90 mL/min   CBC     Status: Abnormal   Collection Time   12/21/11  6:05 AM      Component Value Range Comment   WBC 9.3  4.0 - 10.5 K/uL    RBC 4.31  3.87 - 5.11 MIL/uL    Hemoglobin 10.6 (*) 12.0 - 15.0 g/dL    HCT 11.9 (*) 14.7 - 46.0 %    MCV 83.3  78.0 - 100.0 fL    MCH 24.6 (*) 26.0 - 34.0 pg    MCHC 29.5 (*) 30.0 - 36.0 g/dL    RDW 82.9 (*) 56.2 - 15.5 %    Platelets 427 (*) 150 - 400 K/uL   GLUCOSE, CAPILLARY     Status: Abnormal   Collection Time   12/21/11  7:47 AM      Component Value Range Comment   Glucose-Capillary 130 (*) 70 - 99 mg/dL   GLUCOSE, CAPILLARY     Status: Abnormal   Collection Time   12/21/11 11:41 AM      Component Value  Range Comment   Glucose-Capillary 138 (*) 70 - 99 mg/dL   CBC     Status: Abnormal   Collection Time   12/21/11  4:27 PM      Component Value Range Comment   WBC 10.2  4.0 - 10.5 K/uL    RBC 3.84 (*) 3.87 - 5.11 MIL/uL    Hemoglobin 9.7 (*) 12.0 - 15.0 g/dL    HCT 16.1 (*) 09.6 - 46.0 %    MCV 81.0  78.0 - 100.0 fL    MCH 25.3 (*) 26.0 - 34.0 pg    MCHC 31.2  30.0 - 36.0 g/dL    RDW 04.5 (*) 40.9 - 15.5 %    Platelets 419 (*) 150 - 400 K/uL   PROTIME-INR     Status: Abnormal   Collection Time   12/21/11  4:27 PM      Component Value Range Comment   Prothrombin Time 39.5 (*) 11.6 - 15.2 seconds    INR 4.44 (*) 0.00 - 1.49   RENAL FUNCTION PANEL     Status: Abnormal   Collection Time   12/21/11  4:28 PM      Component Value Range Comment   Sodium 135  135 - 145 mEq/L    Potassium 4.1  3.5 - 5.1 mEq/L    Chloride 95 (*) 96 - 112 mEq/L    CO2 27  19 - 32 mEq/L    Glucose, Bld 133 (*) 70 - 99 mg/dL    BUN 23  6 - 23 mg/dL    Creatinine, Ser 8.11 (*) 0.50 - 1.10 mg/dL    Calcium 91.4  8.4 - 10.5 mg/dL     Phosphorus 4.2  2.3 - 4.6 mg/dL    Albumin 2.8 (*) 3.5 - 5.2 g/dL    GFR calc non Af Amer 9 (*) >90 mL/min    GFR calc Af Amer 10 (*) >90 mL/min   GLUCOSE, CAPILLARY     Status: Normal   Collection Time   12/21/11  9:27 PM      Component Value Range Comment   Glucose-Capillary 75  70 - 99 mg/dL   GLUCOSE, CAPILLARY     Status: Normal   Collection Time   12/22/11  7:18 AM      Component Value Range Comment   Glucose-Capillary 80  70 - 99 mg/dL    Comment 1 Notify RN        HEENT: normal Cardio: RRR Resp: Rales GI: BS positive and Distention Extremity:  No Edema Skin:   Other R BKA with dressing and immobilizer in PT Neuro: Alert/Oriented, Abnormal Sensory absent proprioception, absent lt touch L foot, Abnormal Motor 2-/5 R ankle DF/PF, Dysarthric and Other tardive dyskinesia lip smacking Musc/Skel:  Other R BKA,  Gen: NAD   Assessment/Plan: 1. Functional deficits secondary to R BKA, DM with neuropathy which require 3+ hours per day of interdisciplinary therapy in a comprehensive inpatient rehab setting. Physiatrist is providing close team supervision and 24 hour management of active medical problems listed below. Physiatrist and rehab team continue to assess barriers to discharge/monitor patient progress toward functional and medical goals. FIM: FIM - Bathing Bathing Steps Patient Completed: Buttocks Bathing: 4: Min-Patient completes 8-9 55f 10 parts or 75+ percent (for thoroughness after incontinent BM)  FIM - Upper Body Dressing/Undressing Upper body dressing/undressing steps patient completed: Thread/unthread right sleeve of pullover shirt/dresss;Thread/unthread left sleeve of pullover shirt/dress;Put head through opening of pull over shirt/dress;Pull shirt over trunk Upper body  dressing/undressing: 5: Set-up assist to: Obtain clothing/put away FIM - Lower Body Dressing/Undressing Lower body dressing/undressing steps patient completed: Thread/unthread right underwear  leg;Thread/unthread left underwear leg;Pull underwear up/down;Thread/unthread right pants leg;Thread/unthread left pants leg;Pull pants up/down;Don/Doff left sock Lower body dressing/undressing: 1: Total-Patient completed less than 25% of tasks (patient attempted & assisted all steps)  FIM - Toileting Toileting steps completed by patient: Performs perineal hygiene Toileting Assistive Devices: Grab bar or rail for support Toileting: 0: Activity did not occur  FIM - Diplomatic Services operational officer Devices: Bedside commode;Sliding board;Grab bars Toilet Transfers: 1-Two helpers  FIM - Banker Devices: Bed rails;Arm rests Bed/Chair Transfer: 1: Two helpers  FIM - Locomotion: Wheelchair Distance: 150 Locomotion: Wheelchair: 2: Travels 50 - 149 ft with supervision, cueing or coaxing FIM - Locomotion: Ambulation Locomotion: Ambulation: 0: Activity did not occur  Comprehension Comprehension Mode: Auditory Comprehension: 5-Follows basic conversation/direction: With no assist  Expression Expression Mode: Verbal Expression: 5-Expresses basic needs/ideas: With no assist  Social Interaction Social Interaction Mode: Asleep Social Interaction: 5-Interacts appropriately 90% of the time - Needs monitoring or encouragement for participation or interaction.  Problem Solving Problem Solving Mode: Asleep Problem Solving: 5-Solves basic problems: With no assist  Memory Memory Mode: Asleep Memory: 6-More than reasonable amt of time  Medical Problem List and Plan:  1. DVT Prophylaxis/Anticoagulation: Pharmaceutical: Coumadin  2. Pain Management: monitor on prn Medication for now.Reduce gabapentin to 300mg   3. Mood: difficult to judge. Will monitor for now and have LCSW follow for evaluation.  4. Neuropsych: This patient is capable of making decisions on his/her own behalf.  5. DM type 2 with retinopathy and nephropathy: Continue lantus at  bedtime. SSI for elevated BS.  6. ESRD: HD M, W, F. Daily weights. Renal diet.  7. Low grade fevers: resolved monitor 8.  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  Multiple small meals   LOS (Days) 7 A FACE TO FACE EVALUATION WAS PERFORMED  KIRSTEINS,ANDREW E 12/22/2011, 8:42 AM

## 2011-12-22 NOTE — Evaluation (Signed)
Recreational Therapy Assessment and Plan  Patient Details  Name: Faith [name] MRN: [id number] Date of Birth: 02-10-1956 Today's Date: 12/22/2011  Rehab Potential: Good ELOS:     Assessment Clinical Impression:  Problem List:  Patient Active Problem List   Diagnosis   .  ESRD (end stage renal disease) on dialysis   .  Diabetes mellitus type II   .  Secondary hyperparathyroidism   .  CAD (coronary artery disease) of artery bypass graft   .  PAD (peripheral artery disease)   .  Chronic constipation   .  GERD (gastroesophageal reflux disease)   .  Anemia of chronic renal failure   .  Tardive dyskinesia   .  CVA (cerebral infarction)   .  Depression   .  Neuropathy   .  Diabetic retinopathy associated with type 2 diabetes mellitus   .  Diastolic CHF, chronic   .  History of Osteomyelitis of toe of left foot   .  End stage renal disease   .  Osteomyelitis of toe of right foot    Past Medical History:  Past Medical History   Diagnosis  Date   .  Hyperparathyroidism, secondary renal    .  CAD (coronary artery disease) of artery bypass graft      CABG in 2002   .  PAD (peripheral artery disease)      Not a candidate for revascularization   .  Chronic constipation    .  GERD (gastroesophageal reflux disease)    .  Anemia of chronic renal failure    .  Tardive dyskinesia      Secondary to Reglan   .  Depression    .  Neuropathy    .  Diabetic retinopathy(362.0)    .  Diastolic CHF, chronic      Grade 2   .  Pulmonary embolism  06/2003   .  Hyperlipidemia    .  Myocardial infarction  2002   .  Stroke  2002     "w/left side" residual weakness   .  Dialysis patient  01/17/11     [name]; M, W, F; via right upper arm AVF   .  Diabetes mellitus      type II   .  CHF (congestive heart failure)    .  CVA (cerebral infarction)      right brain with minimal left hemiparesis   .  Pneumonia  10/2009; 11/2009     ?pneumonia 01/17/11   .  Hypertension    .  Arthritis     .  Complication of anesthesia      "sleepy afterwards; due to her being dialysis pt"   .  ESRD (end stage renal disease) on dialysis      M-W-Fr [name]    Past Surgical History:  Past Surgical History   Procedure  Date   .  Coronary artery bypass graft  2002   .  Laparoscopic cholecystectomy  2002   .  Av fistula placement      times 4-using rt upper arm graft-old rt lower arm,lt upper and lower old grafts   .  Trigger finger release    .  Tubal ligation    .  Eye examination under anesthesia w/ retinal cryotherapy and retinal laser    .  Cataract extraction  07/2010     right sided   .  Toe amputation  5/12     left  2nd toe secondary to osteomyelitis   .  Breast biopsy  06/2010   .  Carpal tunnel release  05/05/2011     Procedure: CARPAL TUNNEL RELEASE; Surgeon: Sharma Covert, MD; Location: [facility]; Service: Orthopedics; Laterality: Right;   .  Coronary angioplasty with stent placement    .  Amputation  11/24/2011     Procedure: AMPUTATION RAY; Surgeon: Nadara Mustard, MD; Location[facility] OR; Service: Orthopedics; Laterality: Right; Right Foot 2nd Ray Amputation   .  Amputation  12/12/2011     Procedure: AMPUTATION BELOW KNEE; Surgeon: Nadara Mustard, MD; Location: [facility] OR; Service: Orthopedics; Laterality: Right;    Assessment & Plan  Clinical Impression: Patient is a 56 year old woman with diabetes with diabetic nephropathy and retinopathy, right foot abscess with osteomyelitis and second ray amputation who developed progressive gangrenous changes of her forefoot with fever and pain. 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 Lajoyce Corners. Post op started on coumadin for VTE prophylaxis. KI ordered to help keep knee in extension. She did have increased lethargy with confusion yesterday that has resolved. Patient transferred to CIR on 12/15/2011 .   Pt presents with decreased activity tolerance, decreased functional mobility, decreased balance, increased pain, decreased  awareness, decreased problem solving, decreased safety awareness, decreased memory and delayed processing limiting pt's independence with leisure/community pursuits.  Prior to hospitalization, patient was modified independent with mobility.   Leisure History/Participation Premorbid leisure interest/current participation: Community - Press photographer - Grocery store;Sports - Exercise (Comment);Crafts - Painting;Crafts - Woodworking (get hair & nails done, make bird houses, picture frames) Expression Interests: Music (Comment);Singing (rhythm & blues, gospel) Other Leisure Interests: Television;Reading ("love story magazine") Leisure Participation Style: With Family/Friends Awareness of Community Resources: Good-identify 3 post discharge leisure resources Psychosocial / Spiritual Spiritual Interests: Church Patient agreeable to Pet Therapy: Yes Does patient have pets?: No Social interaction - Mood/Behavior: Cooperative Film/video editor for Education?: Yes Recreational Therapy Orientation Orientation -Reviewed with patient: Available activity resources Strengths/Weaknesses Patient Strengths/Abilities: Willingness to participate;Active premorbidly Patient weaknesses: Physical limitations  Plan Rec Therapy Plan Is patient appropriate for Therapeutic Recreation?: Yes Rehab Potential: Good Treatment times per week: Min 1 time per week >20 minutes TR Treatment/Interventions: Adaptive equipment instruction;1:1 session;Balance/vestibular training;Community reintegration;Functional mobility training;Patient/family education;Recreation/leisure participation;Therapeutic activities;UE/LE Coordination activities;Wheelchair propulsion/positioning  Recommendations for other services: None  Discharge Criteria: Patient will be discharged from TR if patient refuses treatment 3 consecutive times without medical reason.  If treatment goals not met, if there is a change in medical  status, if patient makes no progress towards goals or if patient is discharged from hospital.  The above assessment, treatment plan, treatment alternatives and goals were discussed and mutually agreed upon: by patient  Bhavya Grand 12/22/2011, 12:26 PM

## 2011-12-22 NOTE — Progress Notes (Signed)
Orthopedic Tech Progress Note Patient Details:  Faith [name] 06-21-1955 [id number]  Patient ID: [name], female   DOB: [birth year], 56 y.o.   MRN: [id number] rn was present during this adjustment  Nikki Dom 12/22/2011, 5:38 PM

## 2011-12-22 NOTE — Progress Notes (Signed)
Patient ID: JENNEFER KOPP, female   DOB: 19-Feb-1956, 56 y.o.   MRN: [id number]  [facility] Progress Note    Subjective:   Feels tired in the morning after she takes a "pill"   Objective:   BP 86/56  Pulse 68  Temp 99 F (37.2 C) (Oral)  Resp 18  Wt 68.8 kg (151 lb 10.8 oz)  SpO2 97%  Physical Exam: Gen:WD WN AAF in NAD Ext:no edema, s/p RBKA  Labs: BMET  Lab 12/21/11 1628 12/21/11 0605 12/19/11 1627 12/17/11 1336 12/16/11 1555  NA 135 132* 132* 134* 131*  K 4.1 4.5 4.5 3.9 3.9  CL 95* 90* 95* 95* 91*  CO2 27 28 27 30 26   GLUCOSE 133* 114* 114* 135* 170*  BUN 23 20 25* 15 28*  CREATININE 5.09* 4.47* 5.13* 3.80* 6.06*  ALBUMIN 2.8* 2.9* 2.7* 2.4* 2.4*  CALCIUM 10.5 11.1* 10.8* 10.3 9.5  PHOS 4.2 4.2 4.1 3.5 4.9*   CBC  Lab 12/21/11 1627 12/21/11 0605 12/19/11 1627 12/17/11 1330  WBC 10.2 9.3 9.9 11.9*  NEUTROABS -- -- -- --  HGB 9.7* 10.6* 9.3* 8.6*  HCT 31.1* 35.9* 30.2* 27.4*  MCV 81.0 83.3 82.5 81.3  PLT 419* 427* 379 356    @IMGRELPRIORS @ Medications:      . acetaminophen      . aspirin EC  81 mg Oral Daily  . cinacalcet  30 mg Oral Daily  . cinacalcet  90 mg Oral QPM  . darbepoetin (ARANESP) injection - DIALYSIS  25 mcg Intravenous Q Wed-HD  . ezetimibe  10 mg Oral Daily  . feeding supplement  30 mL Oral BID WC  . fenofibrate  54 mg Oral Daily  . gabapentin  300 mg Oral QHS  . HYDROcodone-acetaminophen  1 tablet Oral Once  . insulin aspart  0-9 Units Subcutaneous TID WC  . insulin aspart  3 Units Subcutaneous TID WC  . insulin glargine  6 Units Subcutaneous QHS  . multivitamin  1 tablet Oral QHS  . pantoprazole  40 mg Oral Q1200  . paricalcitol  8 mcg Intravenous Q M,W,F-HD  . senna-docusate  1 tablet Oral BID  . sevelamer  2,400 mg Oral TID WC  . Warfarin - Pharmacist Dosing Inpatient   Does not apply q1800  . DISCONTD: polyethylene glycol  17 g Oral Daily     Assessment/ Plan:   1. S/p right BKA - rehab in process;  tight heparin with HD; also on coumadin for DVT prophylaxis  2. ESRD - MWF per routine on 2 K 2 Ca bath; check labs pre HD. New EDW ~71-71.5kg  3. Anemia - Hgb decreased post op - check Fe studies today. Continue aranesp; if Hgb < 8,5 increase Aranesp to 60 today.  4. Secondary hyperparathyroidism - hypercalcemia is improving; on decreased zemplar down to 8 from 18 ; on 2 Ca bath; to check labs pre HD; continue sensipar and binders; iPTH 500s.  5. HTN/volume - previously on norvasc 5 at HS; none at present; lower EDW  6. Nutrition - poor po intake and c/o early satiety and N/V. Likely diabetic gastroparesis but no reglan due to tardive dyskinesia. Consider GI eval, calorie count, dietician eval. albumin low on high protein renal diet; add prostat bid  7. DM - per primary 8. Fatigue- pt c/o feeling tired after taking a pill each morning.  ? Tramadol.  Consider holding am dose.  9. Dispo- per CIR  Thermon Zulauf A 12/22/2011, 9:24 AM

## 2011-12-22 NOTE — Progress Notes (Signed)
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)  Required Braces or Orthoses: Knee Immobilizer - Right Knee Immobilizer - Right: On except when in CPM Restrictions Weight Bearing Restrictions: Yes RLE Weight Bearing: Non weight bearing Vital Signs: Therapy Vitals Temp: 98.9 F (37.2 C) Temp src: Oral Pulse Rate: 83  Resp: 18  BP: 99/47 mmHg Patient Position, if appropriate: Sitting Oxygen Therapy SpO2: 95 % O2 Device: None (Room air) Pain: Pain Assessment Pain Assessment: 0-10 Pain Score:   6 Pain Type: Surgical pain Pain Location: Leg Pain Orientation: Right Pain Descriptors:  (phantom, surgical) Pain Onset: Gradual Patients Stated Pain Goal: 2 Pain Intervention(s): RN made aware;Medication (See eMAR);Repositioned  Exercises: Emphasis of session: LE strengthening, transfers. Patient lying in bed upon PT arrival. Patient c/o R leg pain (rated 8/10) requesting pain medication. PT made RN aware and patient recieved pain medication during therapy session. Patient performed the following exercises: supine R hip abd/adduction x 10 reps; bridging with RLE on pillow x 10 reps with 3 sec hold; sidelying R hip ext x 10 reps. Patient required vcs and tactile cues for form, technique and encouragement.  Patient performed bilateral rolling with HOB and without bed rails present. Patient required supervision when rolling to the L; min A for rolling to the R for LLE placement and vcs for sequencing. Patient performed supine to sit with min A at trunk for facilitation. Patient performed sit <> stand to RW x 2 reps with total assist of two helpers. Patient able to maintain standing for ~1 min each stand. Patient  required vcs for hand placement, sequencing, safety. Attempted 3rd rep, however patient unable to perform secondary to fatigue.  Patient performed sit to supine with mod A for bring legs into bed. Patient left in room with call bell in reach, friend from church at bedside.  See FIM for current functional status  Therapy/Group: Individual Therapy  Royann Wildasin Hamilton DPT Student  12/22/2011, 4:11 PM

## 2011-12-22 NOTE — Evaluation (Signed)
The assessment and plan has been reviewed and SLP is in agreement. Berma Harts, M.A., CCC-SLP [phone]  

## 2011-12-22 NOTE — Patient Care Conference (Signed)
Inpatient RehabilitationTeam Conference Note Date: 12/21/3011   Time: 10:35 AM    Patient Name: Faith [name]      Medical Record Number: [id number]  Date of Birth: October 10, 1955 Sex: Female         Room/Bed: 4036/4036-01 Payor Info: Payor: MEDICARE  Plan: MEDICARE PART A AND B  Product Type: *No Product type*     Admitting Diagnosis: RT BKA  Admit Date/Time:  12/15/2011  3:36 PM Admission Comments: No comment available   Primary Diagnosis:  Unilateral complete BKA Principal Problem: Unilateral complete BKA  Patient Active Problem List   Diagnosis Date Noted  . Unilateral complete BKA-right 12/18/2011  . Osteomyelitis of toe of right foot 11/25/2011    Class: Diagnosis of  . End stage renal disease 11/10/2011  . ESRD (end stage renal disease) on dialysis 01/17/2011  . Diabetes mellitus, type 2 01/17/2011  . Secondary hyperparathyroidism 01/17/2011  . CAD (coronary artery disease) of artery bypass graft 01/17/2011  . PAD (peripheral artery disease) 01/17/2011  . Chronic constipation 01/17/2011  . GERD (gastroesophageal reflux disease) 01/17/2011  . Anemia of chronic renal failure 01/17/2011  . Tardive dyskinesia 01/17/2011  . CVA (cerebral infarction) 01/17/2011  . Depression 01/17/2011  . Neuropathy 01/17/2011  . Diabetic retinopathy associated with type 2 diabetes mellitus 01/17/2011  . Diastolic CHF, chronic 01/17/2011  . History of Osteomyelitis of toe of left foot 01/17/2011    Expected Discharge Date: Expected Discharge Date: 01/04/12  Team Members Present: Physician: Dr. Claudette Laws Social Worker Present: Dossie Der, LCSW Nurse Present: Laural Roes, RN PT Present: Edman Circle, PT;[name] Adriana Simas, PT OT Present: Leonette Monarch, OT SLP Present: Fae Pippin, SLP Other (Discipline and Name): [facility] Noel-PPS Coordinator     Current Status/Progress Goal Weekly Team Focus  Medical   pain c/os, allergy to codeine, somnolence with oxycodone, diabetic  gastropathy         Bowel/Bladder   Incontinent of bowel and bladder.Use diaper. LBM 12/21/2011   Continent of bowel and bladder.  Monitor q shift   Swallow/Nutrition/ Hydration             ADL's   Pt is currently Supervision for grooming/UB dressing; Mod Assist for bathing; Max Assist for toileting; Total Assist for LB dressing. Functional transfer: Max Assist with slideboard. ADL  has been completed at bed and w/c level. Tuesday Patient was agreeable to take a shower. Patient fatigues very quickly.  Mod I: grooming; Supervision: UB dressing; Min Assist: bathing/LB dressing/toileting; Mod Assist: tub/shower transfer  ADL performance, functional transfers, energy conservation, sitting balance   Mobility   limited by decreased activity tolerance, RLE pain; mod assist slide board transfers,  w/c mobility with supervision x 20-45'  min assist transfers, supervision w/c mobility x 150' (no gait goals)  transfers, w/c parts management, w/c propulsion; DME   Communication             Safety/Cognition/ Behavioral Observations            Pain   R BKA phantom pain relieved by Tramadol 50 mg and Robaxin 500 mg.  Pain level less than 3 on a scale 81f 0-10.  Assess for effectiveness of intervention and modify as needed.   Skin   .Skin tear on mid sacrum.12/21/2011, Allevyn dressing applied.  No new skin breakdown or infection.  Monitor for any new skin breakdown or infection.      *See Interdisciplinary Assessment and Plan and progress notes for long and short-term goals  Barriers to  Discharge:   Needs 24 hr care   Possible Resolutions to Barriers:    [facility] program seeing what they can provide   Discharge Planning/Teaching Needs:  Home with husband, hired caregiver and Paramedic.  Will need to corrdinate services.  Working on Passenger transport manager Discussion:  Endurance an issue-making progress many medical issues.  No ambulation goals.  Needs 24 hr care upon discharge    Revisions to Treatment Plan:  None      Georgina Krist, Lemar Livings 12/23/2011, 9:17 AM

## 2011-12-22 NOTE — Progress Notes (Signed)
Orthopedic Tech Progress Note Patient Details:  Faith [name] Mar 13, 1955 [id number]  Patient ID: [name], female   DOB: Nov 21, 1955, 56 y.o.   MRN: [id number] Adjusted knee immobilizer on pt's rle so that it fits correctly  Nikki Dom 12/22/2011, 5:35 PM

## 2011-12-22 NOTE — Progress Notes (Signed)
Social Work Patient ID: Faith [name], female   DOB: 04-11-1955, 56 y.o.   MRN: [id number] Spoke with husband via telephone to discuss team conference goals and team's recommendations.  Informed have spoken to [facility] and informed them of Recommendations and they will present in their morning meeting.  Awaiting results from this meeting regarding wheelchair information and care they can Provide at discharge.  Husband very appreciative and will await results also.  Pt is brighter today and smiling.  Continue to work on discharge needs.

## 2011-12-22 NOTE — Progress Notes (Signed)
Occupational Therapy Session Note  Patient Details  Name: Faith [name] MRN: [id number] Date of Birth: [birth year]  Today's Date: 12/22/2011 Time: 0830-0930 Time Calculation (min): 60 min  Short Term Goals: Week 1:  OT Short Term Goal 1 (Week 1): Pt will increase UB dressing to Supervision. OT Short Term Goal 2 (Week 1): Pt will increase bathing to Mod Assist. (LB at bed level. UB at sink). OT Short Term Goal 3 (Week 1): Pt will increase LB dressing to Mod Assist at bed level. OT Short Term Goal 4 (Week 1): Pt will increase toileting to Max Assist. OT Short Term Goal 5 (Week 1): Pt will increase toilet transfer to Max Assist with slideboard.  Skilled Therapeutic Interventions/Progress Updates:  ADL re-training completed EOB for UB bathing and dressing and supine in bed for LB bathing and dressing this AM. Session with focus on bathing, dressing, sitting balance, slideboard transfers, and bed mobility. Pt still requires Total Assist for LB dressing, but did assist therapist with pulling underwear down over hips while sitting on EOB while leaning left and right. When finished with dressing in bed, patient performed slideboard transfer with supervision to w/c. Therapist set-up slideboard and prevented it from moving while patient scooted to w/c.   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

## 2011-12-23 ENCOUNTER — Inpatient Hospital Stay (HOSPITAL_COMMUNITY): Payer: Medicare (Managed Care) | Admitting: Speech Pathology

## 2011-12-23 ENCOUNTER — Inpatient Hospital Stay (HOSPITAL_COMMUNITY): Payer: Medicare (Managed Care)

## 2011-12-23 DIAGNOSIS — G8110 Spastic hemiplegia affecting unspecified side: Secondary | ICD-10-CM

## 2011-12-23 DIAGNOSIS — S88119A Complete traumatic amputation at level between knee and ankle, unspecified lower leg, initial encounter: Secondary | ICD-10-CM

## 2011-12-23 DIAGNOSIS — I70269 Atherosclerosis of native arteries of extremities with gangrene, unspecified extremity: Secondary | ICD-10-CM

## 2011-12-23 LAB — RENAL FUNCTION PANEL
BUN: 18 mg/dL (ref 6–23)
CO2: 31 mEq/L (ref 19–32)
Chloride: 94 mEq/L — ABNORMAL LOW (ref 96–112)
Creatinine, Ser: 4.57 mg/dL — ABNORMAL HIGH (ref 0.50–1.10)
Glucose, Bld: 77 mg/dL (ref 70–99)

## 2011-12-23 LAB — PROTIME-INR: INR: 2.69 — ABNORMAL HIGH (ref 0.00–1.49)

## 2011-12-23 LAB — CBC
HCT: 32 % — ABNORMAL LOW (ref 36.0–46.0)
MCH: 24.7 pg — ABNORMAL LOW (ref 26.0–34.0)
MCV: 82.5 fL (ref 78.0–100.0)
RBC: 3.88 MIL/uL (ref 3.87–5.11)
WBC: 8.5 10*3/uL (ref 4.0–10.5)

## 2011-12-23 LAB — GLUCOSE, CAPILLARY
Glucose-Capillary: 80 mg/dL (ref 70–99)
Glucose-Capillary: 96 mg/dL (ref 70–99)

## 2011-12-23 MED ORDER — PARICALCITOL 5 MCG/ML IV SOLN
INTRAVENOUS | Status: AC
  Administered 2011-12-23: 8 ug via INTRAVENOUS
  Filled 2011-12-23: qty 2

## 2011-12-23 MED ORDER — NEPRO/CARBSTEADY PO LIQD
237.0000 mL | Freq: Two times a day (BID) | ORAL | Status: DC
  Administered 2011-12-23 – 2012-01-03 (×13): 237 mL via ORAL

## 2011-12-23 MED ORDER — HYDROCODONE-ACETAMINOPHEN 5-325 MG PO TABS
1.0000 | ORAL_TABLET | Freq: Once | ORAL | Status: AC
  Administered 2011-12-23: 1 via ORAL

## 2011-12-23 MED ORDER — HYDROCODONE-ACETAMINOPHEN 5-325 MG PO TABS
ORAL_TABLET | ORAL | Status: AC
  Administered 2011-12-23: 1 via ORAL
  Filled 2011-12-23: qty 1

## 2011-12-23 MED ORDER — WARFARIN 0.5 MG HALF TABLET
0.5000 mg | ORAL_TABLET | Freq: Once | ORAL | Status: AC
  Administered 2011-12-23: 0.5 mg via ORAL
  Filled 2011-12-23: qty 1

## 2011-12-23 NOTE — Progress Notes (Signed)
Patient ID: Faith [name], female   DOB: [birth year], 57 y.o.   MRN: [id number]  [facility] Progress Note    Subjective:   Feels good   Objective:   BP 96/59  Pulse 86  Temp 98.7 F (37.1 C) (Oral)  Resp 18  Wt 71.5 kg (157 lb 10.1 oz)  SpO2 97%  Physical Exam: Gen:WD WN AAF in NAD CVS:RRR Resp:CTA WUJ:WJXBJY Ext:s/p RBKA, RAVG +T/B  Labs: BMET  Lab 12/23/11 0608 12/21/11 1628 12/21/11 0605 12/19/11 1627 12/17/11 1336 12/16/11 1555  NA 136 135 132* 132* 134* 131*  K 3.7 4.1 4.5 4.5 3.9 3.9  CL 94* 95* 90* 95* 95* 91*  CO2 31 27 28 27 30 26   GLUCOSE 77 133* 114* 114* 135* 170*  BUN 18 23 20  25* 15 28*  CREATININE 4.57* 5.09* 4.47* 5.13* 3.80* 6.06*  ALBUMIN 2.8* 2.8* 2.9* 2.7* 2.4* 2.4*  CALCIUM 10.6* 10.5 11.1* 10.8* 10.3 9.5  PHOS 3.2 4.2 4.2 4.1 3.5 4.9*   CBC  Lab 12/23/11 0608 12/21/11 1627 12/21/11 0605 12/19/11 1627  WBC 8.5 10.2 9.3 9.9  NEUTROABS -- -- -- --  HGB 9.6* 9.7* 10.6* 9.3*  HCT 32.0* 31.1* 35.9* 30.2*  MCV 82.5 81.0 83.3 82.5  PLT 418* 419* 427* 379    @IMGRELPRIORS @ Medications:      . aspirin EC  81 mg Oral Daily  . cinacalcet  30 mg Oral Daily  . cinacalcet  90 mg Oral QPM  . darbepoetin (ARANESP) injection - DIALYSIS  25 mcg Intravenous Q Wed-HD  . ezetimibe  10 mg Oral Daily  . feeding supplement  30 mL Oral BID WC  . fenofibrate  54 mg Oral Daily  . gabapentin  300 mg Oral QHS  . insulin aspart  0-9 Units Subcutaneous TID WC  . insulin aspart  3 Units Subcutaneous TID WC  . insulin glargine  6 Units Subcutaneous QHS  . multivitamin  1 tablet Oral QHS  . pantoprazole  40 mg Oral Q1200  . paricalcitol  8 mcg Intravenous Q M,W,F-HD  . senna-docusate  1 tablet Oral BID  . sevelamer  2,400 mg Oral TID WC  . warfarin  0.5 mg Oral ONCE-1800  . Warfarin - Pharmacist Dosing Inpatient   Does not apply q1800     Assessment/ Plan:   1. S/p right BKA - rehab in process; tight heparin with HD; also on coumadin for  DVT prophylaxis  2. ESRD - MWF per routine on 2 K 2 Ca bath; check labs pre HD. New EDW ~71-71.5kg  3. Anemia - Hgb decreased post op - check Fe studies today. Continue aranesp; if Hgb < 8,5 increase Aranesp to 60 today.  4. Secondary hyperparathyroidism - hypercalcemia is improving; on decreased zemplar down to 8 from 18 ; on 2 Ca bath; to check labs pre HD; continue sensipar and binders; iPTH 500s.  5. HTN/volume - previously on norvasc 5 at HS; none at present; lower EDW  6. Nutrition - poor po intake and c/o early satiety and N/V. Likely diabetic gastroparesis but no reglan due to tardive dyskinesia. Consider GI eval, calorie count, dietician eval. albumin low on high protein renal diet; add prostat bid  7. DM - per primary 8. Fatigue- pt c/o feeling tired after taking a pill each morning. ? Tramadol. Consider holding am dose.  9. Dispo- per CIR Tino Ronan A 12/23/2011, 10:11 AM

## 2011-12-23 NOTE — Progress Notes (Addendum)
Physical Therapy Weekly Progress Note  Patient Details  Name: Faith [name] MRN: [id number] Date of Birth: [birth year]  Today's Date: 12/23/2011 Time: 1105-1205 Time Calculation (min): 60 min  Patient has met 4 of 4 short term goals.  Pt's alertness and activity tolerance have improved in the last couple of days.  Patient continues to demonstrate the following deficits: strength bil LEs, LLE and LUE muscle timing and sequence, balance, activity tolerance and therefore will continue to benefit from skilled PT intervention to enhance overall performance with activity tolerance, balance, postural control, ability to compensate for deficits and functional use of  right lower extremity, left upper extremity and left lower extremity.  Patient progressing toward long term goals..  Continue plan of care.  [facility] has agreed to purchase new w/c for pt; call placed to [employer], [employer] requesting w/c eval.  PT Short Term Goals Week 1:  PT Short Term Goal 1 (Week 1): pt will move supine > sit with mod assist in flat bed without rails PT Short Term Goal 1 - Progress (Week 1): Met PT Short Term Goal 2 (Week 1): pt will transfer bed>< w/c with mod assist PT Short Term Goal 2 - Progress (Week 1): Met PT Short Term Goal 3 (Week 1): pt will propel w/c x 64' with supervision PT Short Term Goal 3 - Progress (Week 1): Met PT Short Term Goal 4 (Week 1): pt will manipulate w/c parts for transfer with mod assist PT Short Term Goal 4 - Progress (Week 1): Met      Skilled Therapeutic Interventions/Progress Updates:  Husband Fayrene Fearing here to observe and start family ed.  Discussed home set-up; he will measure width of BR doorway, ht of car seat.  Home bed= 22" high.  W/c mobility x 74' with supervision, using bil UEs.  Pt c/o upper back, periscapular pain bil after w/c propulsion.  Discussed with PA; pt has used BioFreeze in the past and husband will bring some in.  W/c set-up for transfer with mod VCs, min assist  for legrests and L armrest lift out of the way.  W/c> mat at 22" (ht of bed at home) slightly uphill, with min assist including placement of SB with loose pillow case on top.  Sit> supine with close supervision; scooting on mat with mod assist.  Phantom pain exs performed with pt , concealing RLE with sheet.  Discussed purpose with husband.  Therapeutic exercise performed with bil LE, bil UEs to increase strength for functional mobility:   10 x 1 each supine-  bil bridging with bolsters under thighs; active assistive L unilateral bridging; L sidelying- LLE hip extension and hip abduction, active assistive; R sidelying - L resisted hip (10 x 2) extension, active assistive hip abduction.  Sitting- reaching x 5 each with L or R hand overhand to increase trunk extension, translation of pelvis over femurs; press-up blocks 5 x 2 with LLE wt bearing focusing on elevating hips.  Pt does not clear buttocks.   Mat> w/c slightly downhill performed by Fayrene Fearing, with close supervision including placement of SB with loose pillow case on top, to reduce friction.     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)  Required Braces or Orthoses: Knee Immobilizer - Right Knee Immobilizer - Right: On except when in CPM Restrictions Weight Bearing Restrictions: Yes RLE Weight Bearing: Non weight bearing     Pain: Pain Assessment Pain Assessment: No/denies pain Pain Score:  4 Pain Type: Surgical pain Pain Location: Leg Pain Orientation: Right Pain Descriptors: Sore Pain Frequency: Constant Pain Onset: Progressive Patients Stated Pain Goal: 2 Pain Intervention(s): Medication (See eMAR) Multiple Pain Sites: No      See FIM for current functional status  Therapy/Group: Individual Therapy  Renesmee Raine 12/23/2011, 12:40 PM

## 2011-12-23 NOTE — Progress Notes (Signed)
ANTICOAGULATION CONSULT NOTE - Follow Up Consult  Pharmacy Consult:  Coumadin Indication: VTE prophylaxis  Allergies  Allergen Reactions  . Metoclopramide Hcl Other (See Comments)    Tardive dyskinesia  . Codeine Nausea And Vomiting  . Lipitor (Atorvastatin Calcium) Other (See Comments)    Muscle weakness    Patient Measurements: Weight: 157 lb 10.1 oz (71.5 kg)  Vital Signs: Temp: 98.6 F (37 C) (11/01 1300) Temp src: Oral (11/01 1300) BP: 127/66 mmHg (11/01 1317) Pulse Rate: 78  (11/01 1317)  Labs:  Basename 12/23/11 1610 12/22/11 1518 12/21/11 1628 12/21/11 1627 12/21/11 0605  HGB 9.6* -- -- 9.7* --  HCT 32.0* -- -- 31.1* 35.9*  PLT 418* -- -- 419* 427*  APTT -- -- -- -- --  LABPROT 27.3* 29.4* -- 39.5* --  INR 2.69* 2.98* -- 4.44* --  HEPARINUNFRC -- -- -- -- --  CREATININE 4.57* -- 5.09* -- 4.47*  CKTOTAL -- -- -- -- --  CKMB -- -- -- -- --  TROPONINI -- -- -- -- --    The CrCl is unknown because both a height and weight (above a minimum accepted value) are required for this calculation.  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  Coumadin was held on 10/23 through 10/25 due to a rapid increase in INR after Coumadin 5mg  x 2 on 10/21 and 10/22.   Patient did have elevated baseline INR.  Coumadin resumed 10/26 at a lower dose and INR was supratherapeutic again.  This may be influenced by poor PO intake and nausea/vomiting.  It has returned to the therapeutic range after holding Warfarin 10/30 and giving 0.5mg  10/31.  Goal of Therapy:  INR 2-3   Plan:  Coumadin 0.5mg  x 1 today Daily PT/INR  Estella Husk, Pharm.D., BCPS Clinical Pharmacist  Phone [phone] Pager [phone] 12/23/2011, 2:30 PM

## 2011-12-23 NOTE — Progress Notes (Signed)
INITIAL ADULT NUTRITION ASSESSMENT Date: 12/23/2011   Time: 1:34 PM  Reason for Assessment: Nursing Consult for Poor PO Intake  INTERVENTION: 1. Nepro Shake po BID between meals, each supplement provides 425 kcal and 19 grams protein. 2. Discussed need for supplementation between meals; encouraged pt to eat solid foods at meal times (rather than substituting meals with supplements.) 3. RD to continue to follow nutrition care plan  DOCUMENTATION CODES Per approved criteria  -Severe malnutrition in the context of chronic illness   ASSESSMENT: Female 56 y.o.  Dx: Unilateral complete BKA  Hx:  Past Medical History  Diagnosis Date  . Hyperparathyroidism, secondary renal   . CAD (coronary artery disease) of artery bypass graft     CABG in 2002  . PAD (peripheral artery disease)     Not a candidate for revascularization  . Chronic constipation   . GERD (gastroesophageal reflux disease)   . Anemia of chronic renal failure   . Tardive dyskinesia     Secondary to Reglan  . Depression   . Neuropathy   . Diabetic retinopathy(362.0)   . Diastolic CHF, chronic     Grade 2  . Pulmonary embolism 06/2003  . Hyperlipidemia   . Myocardial infarction 2002  . Stroke 2002    "w/left side" residual weakness  . ESRD on dialysis 01/17/11    [name]; M, W, F; via right upper arm AVF  . Diabetes mellitus     type II  . CHF (congestive heart failure)   . CVA (cerebral infarction)     right brain with minimal left hemiparesis  . Pneumonia 10/2009; 11/2009    ?pneumonia 01/17/11  . Hypertension   . Arthritis   . Complication of anesthesia     "sleepy afterwards; due to her being dialysis pt"   Past Surgical History  Procedure Date  . Coronary artery bypass graft 2002  . Laparoscopic cholecystectomy 2002  . Av fistula placement     times 4-using rt upper arm graft-old rt lower arm,lt upper and lower old grafts  . Trigger finger release   . Tubal ligation   . Eye examination under  anesthesia w/ retinal cryotherapy and retinal laser   . Cataract extraction 07/2010    right sided   . Toe amputation 5/12    left 2nd toe secondary to osteomyelitis  . Breast biopsy 06/2010  . Carpal tunnel release 05/05/2011    Procedure: CARPAL TUNNEL RELEASE;  Surgeon: Sharma Covert, MD;  Location: [facility];  Service: Orthopedics;  Laterality: Right;  . Coronary angioplasty with stent placement   . Amputation 11/24/2011    Procedure: AMPUTATION RAY;  Surgeon: Nadara Mustard, MD;  Location: [facility] OR;  Service: Orthopedics;  Laterality: Right;  Right Foot 2nd Ray Amputation  . Amputation 12/12/2011    Procedure: AMPUTATION BELOW KNEE;  Surgeon: Nadara Mustard, MD;  Location: [facility] OR;  Service: Orthopedics;  Laterality: Right;   Related Meds:     . aspirin EC  81 mg Oral Daily  . cinacalcet  30 mg Oral Daily  . cinacalcet  90 mg Oral QPM  . darbepoetin (ARANESP) injection - DIALYSIS  25 mcg Intravenous Q Wed-HD  . ezetimibe  10 mg Oral Daily  . feeding supplement  30 mL Oral BID WC  . fenofibrate  54 mg Oral Daily  . gabapentin  300 mg Oral QHS  . insulin aspart  0-9 Units Subcutaneous TID WC  . insulin aspart  3  Units Subcutaneous TID WC  . insulin glargine  6 Units Subcutaneous QHS  . multivitamin  1 tablet Oral QHS  . pantoprazole  40 mg Oral Q1200  . paricalcitol  8 mcg Intravenous Q M,W,F-HD  . senna-docusate  1 tablet Oral BID  . sevelamer  2,400 mg Oral TID WC  . warfarin  0.5 mg Oral ONCE-1800  . Warfarin - Pharmacist Dosing Inpatient   Does not apply q1800   Ht:   5' 4.5" (163.8 cm)  Wt: 154 lb s/p HD on 10/30  Ideal Wt (Adjusted for BKA):    125 lb/56.8 kg % Ideal Wt: 123%  Wt Readings from Last 15 Encounters:  12/23/11 157 lb 10.1 oz (71.5 kg)  12/14/11 159 lb 6.3 oz (72.3 kg)  12/14/11 159 lb 6.3 oz (72.3 kg)  11/25/11 175 lb 0.7 oz (79.4 kg)  11/25/11 175 lb 0.7 oz (79.4 kg)  11/10/11 180 lb (81.647 kg)  10/20/11 180 lb (81.647 kg)  10/20/11 180  lb (81.647 kg)  10/13/11 181 lb (82.101 kg)  05/03/11 180 lb (81.647 kg)  05/03/11 180 lb (81.647 kg)  01/21/11 163 lb 12.8 oz (74.3 kg)  Usual Wt: 180 lb % Usual Wt: 86%  BMI is 28.4 (using adjusted weight for BKA)  Food/Nutrition Related Hx: pt endorses poor PO intake  Labs:  CMP     Component Value Date/Time   NA 136 12/23/2011 0608   K 3.7 12/23/2011 0608   CL 94* 12/23/2011 0608   CO2 31 12/23/2011 0608   GLUCOSE 77 12/23/2011 0608   BUN 18 12/23/2011 0608   CREATININE 4.57* 12/23/2011 0608   CALCIUM 10.6* 12/23/2011 0608   CALCIUM 9.8 01/18/2011 0540   PROT 8.0 12/12/2011 1414   ALBUMIN 2.8* 12/23/2011 0608   AST 30 12/12/2011 1414   ALT 18 12/12/2011 1414   ALKPHOS 111 12/12/2011 1414   BILITOT 1.8* 12/12/2011 1414   GFRNONAA 10* 12/23/2011 0608   GFRAA 11* 12/23/2011 0608   Phosphorus  Date/Time Value Range Status  12/23/2011  6:08 AM 3.2  2.3 - 4.6 mg/dL Final   Magnesium  Date/Time Value Range Status  01/17/2011  4:55 PM 2.0  1.5 - 2.5 mg/dL Final    Intake/Output Summary (Last 24 hours) at 12/23/11 1343 Last data filed at 12/23/11 1256  Gross per 24 hour  Intake    480 ml  Output      0 ml  Net    480 ml   Diet Order: Renal 80 - 90; 1 sodium packet per meal   Supplements/Tube Feeding: 30 ml Prostat PO BID  IVF:    Estimated Nutritional Needs:   Kcal: 2000 - 2200 kcal Protein: 86 - 100 grams protein Fluid: 1.2 liters daily  Pt is s/p R-BKA 2/2 gangrene and osteomyelitis. Past medical hx is significant for diabetic nephropathy and retinopathy.  Pt with diabetic gastropathy - complains of early satiety, n/v starting on 10/30. Pt not able to take reglan 2/2 tardive dyskinesia. Consider GI and RD evaluation.  New EDW is 71-71.5 kg, per renal. Pt with 14% wt loss x 2 months; noted that estimated wt loss for BKA is approximately 6%. This is an estimated wt loss of 8% x 2 months.  Pt meets criteria for severe malnutrition in the context of chronic illness as  evidenced by wt loss of 8% x 2 months and intake of <75% of estimated energy intake x 1 month.  Pt reports that she tries to follow  a renal diet at home. States that her intake is poor presently. Doesn't care for the prostat, willing to try Nepro. Pt agreeable to [employer]. RD provided samples to nursing unit for patient.  NUTRITION DIAGNOSIS: Inadequate oral intake r/t GI distress AEB pt report and decreasing weight.  MONITORING/EVALUATION(Goals): Goal: Pt to meet >/= 90% of their estimated nutrition needs Monitor: weight trends, lab trends, I/O's, PO intake, supplement tolerance  EDUCATION NEEDS: -No education needs identified at this time  Jarold Motto MS, RD, LDN Pager: [phone] After-hours pager: [phone]

## 2011-12-23 NOTE — Progress Notes (Signed)
Occupational Therapy Session Note  Patient Details  Name: Faith [name] MRN: [id number] Date of Birth: [birth year]  Today's Date: 12/23/2011 Time: 0830-1000 Time Calculation (min): 90 min  Short Term Goals: Week 1:  OT Short Term Goal 1 (Week 1): Pt will increase UB dressing to Supervision. OT Short Term Goal 2 (Week 1): Pt will increase bathing to Mod Assist. (LB at bed level. UB at sink). OT Short Term Goal 3 (Week 1): Pt will increase LB dressing to Mod Assist at bed level. OT Short Term Goal 4 (Week 1): Pt will increase toileting to Max Assist. OT Short Term Goal 5 (Week 1): Pt will increase toilet transfer to Max Assist with slideboard.  Skilled Therapeutic Interventions/Progress Updates:  ADL re-training completed in shower this AM. Session with focus on bed <>w/c transfer, toilet transfer, shower transfer (all with slideboard), bathing, dressing, sitting balance, and bed mobility. Stump and dialysis port covered during shower. Pt issued long handled sponge to increase functional performance during bathing. UB dressing performed in w/c and pt transferred to bed to perform LB dressing. Patient still requires vc's during slideboard transfers for safety, and increase assistance during LB dressing due to fatigue.   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)  Required Braces or Orthoses: Knee Immobilizer - Right Knee Immobilizer - Right: On except when in CPM Restrictions Weight Bearing Restrictions: Yes RLE Weight Bearing: Non weight bearing Pain: Pain Assessment Pain Assessment: 0-10 Pain Score:   5 Pain Type: Surgical pain Pain Location: Leg Pain Orientation: Right Pain Descriptors: Sore Pain Frequency: Constant Pain Onset: Progressive Patients Stated Pain Goal: 2 Pain Intervention(s): Medication (See eMAR) Multiple Pain Sites: No  See FIM for current functional  status  Therapy/Group: Individual Therapy  Limmie Patricia, OTR/L 12/23/2011, 11:30 AM

## 2011-12-23 NOTE — Progress Notes (Signed)
Speech Language Pathology Daily Session Note  Patient Details  Name: Faith [name] MRN: [id number] Date of Birth: 1955-11-19  Today's Date: 12/23/2011 Time: 1015-1100 Time Calculation (min): 45 min  Short Term Goals: Week 1: SLP Short Term Goal 1 (Week 1): Patient will utilize external aids to facilitate increased recall of new information with Min A visual and verbal cuing.   SLP Short Term Goal 1 - Progress (Week 1): Progressing toward goal SLP Short Term Goal 2 (Week 1): Patient will demonstrate improved emergent awareness by asking for help when needed with Min A verbal cuing. SLP Short Term Goal 2 - Progress (Week 1): Progressing toward goal SLP Short Term Goal 3 (Week 1): Patient will solve basic problems during functional tasks with Min A visual and verbal cuing.   SLP Short Term Goal 3 - Progress (Week 1): Progressing toward goal  Skilled Therapeutic Interventions: Pt. seen today for cognitive faciliation.  Treatment focused on use of external aids to assist with working/prospective memory/organization/planning.  She stated various questions she has regarding her care and required mild-moderate verbal assist to problem solve ways to store and retrieve information.  Pt. provided with packet of paper, pencil and folder in which she required moderate verbal/visual assit to write questions and information to recall at a later date.  SLP verbally/visually assisted pt. to organize multiple pieces of paper with phone numbers etc.. onto one piece of paper to keep in one central location (in folder).  SLP discussed the need for her to state difficulty and request assist when needed.     FIM:  Comprehension Comprehension: 5-Follows basic conversation/direction: With extra time/assistive device Expression Expression Mode: Verbal Expression: 5-Expresses basic needs/ideas: With extra time/assistive device Social Interaction Social Interaction: 6-Interacts appropriately with others with  medication or extra time (anti-anxiety, antidepressant). Problem Solving Problem Solving: 3-Solves basic 50 - 74% of the time/requires cueing 25 - 49% of the time Memory Memory: 3-Recognizes or recalls 50 - 74% of the time/requires cueing 25 - 49% of the time FIM - Eating Eating Activity: 0: Activity did not occur  Pain Pain Assessment Pain Assessment: No/denies pain Pain Score:   3 Pain Type: Surgical pain Pain Location: Leg Pain Orientation: Right Pain Descriptors: Sore Pain Frequency: Constant Pain Onset: Progressive Patients Stated Pain Goal: 2 Pain Intervention(s): Medication (See eMAR) Multiple Pain Sites: No  Therapy/Group: Individual Therapy  Breck Coons [place].Ed [employer] [phone]  12/23/2011

## 2011-12-23 NOTE — Progress Notes (Signed)
Social Work Patient ID: Faith [name], female   DOB: [birth year], 56 y.o.   MRN: [id number] Received permission from [facility] to have [employer] to come and evaluate pt for a wheelchair.  Will let Scott Know to inform [facility] of recommendations since they will have to do the ordering.  Continue to work on discharge needs. [name]-PT aware and will contact Scott to set up evaluation.

## 2011-12-23 NOTE — Progress Notes (Signed)
Patient ID: Faith [name], female   DOB: 17-Jul-1955, 56 y.o.   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 Lajoyce Corners. Post op started on coumadin for VTE prophylaxis. KI ordered to help keep knee in extension.  ESRD on HD  Subjective/Complaints:  Slept very well. No pain this am     Objective: Vital Signs: Blood pressure 96/59, pulse 86, temperature 98.7 F (37.1 C), temperature source Oral, resp. rate 18, weight 71.5 kg (157 lb 10.1 oz), SpO2 97.00%. No results found. Results for orders placed during the hospital encounter of 12/15/11 (from the past 72 hour(s))  GLUCOSE, CAPILLARY     Status: Abnormal   Collection Time   12/20/11 11:38 AM      Component Value Range Comment   Glucose-Capillary 115 (*) 70 - 99 mg/dL    Comment 1 Notify RN     GLUCOSE, CAPILLARY     Status: Abnormal   Collection Time   12/20/11  4:34 PM      Component Value Range Comment   Glucose-Capillary 122 (*) 70 - 99 mg/dL   GLUCOSE, CAPILLARY     Status: Normal   Collection Time   12/20/11  9:20 PM      Component Value Range Comment   Glucose-Capillary 86  70 - 99 mg/dL   PROTIME-INR     Status: Abnormal   Collection Time   12/21/11  6:05 AM      Component Value Range Comment   Prothrombin Time 34.7 (*) 11.6 - 15.2 seconds    INR 3.73 (*) 0.00 - 1.49   RENAL FUNCTION PANEL     Status: Abnormal   Collection Time   12/21/11  6:05 AM      Component Value Range Comment   Sodium 132 (*) 135 - 145 mEq/L    Potassium 4.5  3.5 - 5.1 mEq/L    Chloride 90 (*) 96 - 112 mEq/L    CO2 28  19 - 32 mEq/L    Glucose, Bld 114 (*) 70 - 99 mg/dL    BUN 20  6 - 23 mg/dL    Creatinine, Ser 4.09 (*) 0.50 - 1.10 mg/dL    Calcium 81.1 (*) 8.4 - 10.5 mg/dL    Phosphorus 4.2  2.3 - 4.6 mg/dL     Albumin 2.9 (*) 3.5 - 5.2 g/dL    GFR calc non Af Amer 10 (*) >90 mL/min    GFR calc Af Amer 12 (*) >90 mL/min   CBC     Status: Abnormal   Collection Time   12/21/11  6:05 AM      Component Value Range Comment   WBC 9.3  4.0 - 10.5 K/uL    RBC 4.31  3.87 - 5.11 MIL/uL    Hemoglobin 10.6 (*) 12.0 - 15.0 g/dL    HCT 91.4 (*) 78.2 - 46.0 %    MCV 83.3  78.0 - 100.0 fL    MCH 24.6 (*) 26.0 - 34.0 pg    MCHC 29.5 (*) 30.0 - 36.0 g/dL    RDW 95.6 (*) 21.3 - 15.5 %    Platelets 427 (*) 150 - 400 K/uL   GLUCOSE, CAPILLARY     Status: Abnormal   Collection Time   12/21/11  7:47 AM  Component Value Range Comment   Glucose-Capillary 130 (*) 70 - 99 mg/dL   GLUCOSE, CAPILLARY     Status: Abnormal   Collection Time   12/21/11 11:41 AM      Component Value Range Comment   Glucose-Capillary 138 (*) 70 - 99 mg/dL   CBC     Status: Abnormal   Collection Time   12/21/11  4:27 PM      Component Value Range Comment   WBC 10.2  4.0 - 10.5 K/uL    RBC 3.84 (*) 3.87 - 5.11 MIL/uL    Hemoglobin 9.7 (*) 12.0 - 15.0 g/dL    HCT 16.1 (*) 09.6 - 46.0 %    MCV 81.0  78.0 - 100.0 fL    MCH 25.3 (*) 26.0 - 34.0 pg    MCHC 31.2  30.0 - 36.0 g/dL    RDW 04.5 (*) 40.9 - 15.5 %    Platelets 419 (*) 150 - 400 K/uL   PROTIME-INR     Status: Abnormal   Collection Time   12/21/11  4:27 PM      Component Value Range Comment   Prothrombin Time 39.5 (*) 11.6 - 15.2 seconds    INR 4.44 (*) 0.00 - 1.49   RENAL FUNCTION PANEL     Status: Abnormal   Collection Time   12/21/11  4:28 PM      Component Value Range Comment   Sodium 135  135 - 145 mEq/L    Potassium 4.1  3.5 - 5.1 mEq/L    Chloride 95 (*) 96 - 112 mEq/L    CO2 27  19 - 32 mEq/L    Glucose, Bld 133 (*) 70 - 99 mg/dL    BUN 23  6 - 23 mg/dL    Creatinine, Ser 8.11 (*) 0.50 - 1.10 mg/dL    Calcium 91.4  8.4 - 10.5 mg/dL    Phosphorus 4.2  2.3 - 4.6 mg/dL    Albumin 2.8 (*) 3.5 - 5.2 g/dL    GFR calc non Af Amer 9 (*) >90 mL/min    GFR  calc Af Amer 10 (*) >90 mL/min   GLUCOSE, CAPILLARY     Status: Normal   Collection Time   12/21/11  9:27 PM      Component Value Range Comment   Glucose-Capillary 75  70 - 99 mg/dL   GLUCOSE, CAPILLARY     Status: Normal   Collection Time   12/22/11  7:18 AM      Component Value Range Comment   Glucose-Capillary 80  70 - 99 mg/dL    Comment 1 Notify RN     GLUCOSE, CAPILLARY     Status: Abnormal   Collection Time   12/22/11 11:42 AM      Component Value Range Comment   Glucose-Capillary 129 (*) 70 - 99 mg/dL    Comment 1 Notify RN     PROTIME-INR     Status: Abnormal   Collection Time   12/22/11  3:18 PM      Component Value Range Comment   Prothrombin Time 29.4 (*) 11.6 - 15.2 seconds    INR 2.98 (*) 0.00 - 1.49   GLUCOSE, CAPILLARY     Status: Normal   Collection Time   12/22/11  4:49 PM      Component Value Range Comment   Glucose-Capillary 87  70 - 99 mg/dL   GLUCOSE, CAPILLARY     Status: Abnormal   Collection Time  12/22/11  4:51 PM      Component Value Range Comment   Glucose-Capillary 123 (*) 70 - 99 mg/dL   GLUCOSE, CAPILLARY     Status: Abnormal   Collection Time   12/22/11  9:27 PM      Component Value Range Comment   Glucose-Capillary 121 (*) 70 - 99 mg/dL   CBC     Status: Abnormal   Collection Time   12/23/11  6:08 AM      Component Value Range Comment   WBC 8.5  4.0 - 10.5 K/uL    RBC 3.88  3.87 - 5.11 MIL/uL    Hemoglobin 9.6 (*) 12.0 - 15.0 g/dL    HCT 16.1 (*) 09.6 - 46.0 %    MCV 82.5  78.0 - 100.0 fL    MCH 24.7 (*) 26.0 - 34.0 pg    MCHC 30.0  30.0 - 36.0 g/dL    RDW 04.5 (*) 40.9 - 15.5 %    Platelets 418 (*) 150 - 400 K/uL   RENAL FUNCTION PANEL     Status: Abnormal   Collection Time   12/23/11  6:08 AM      Component Value Range Comment   Sodium 136  135 - 145 mEq/L    Potassium 3.7  3.5 - 5.1 mEq/L    Chloride 94 (*) 96 - 112 mEq/L    CO2 31  19 - 32 mEq/L    Glucose, Bld 77  70 - 99 mg/dL    BUN 18  6 - 23 mg/dL    Creatinine,  Ser 8.11 (*) 0.50 - 1.10 mg/dL    Calcium 91.4 (*) 8.4 - 10.5 mg/dL    Phosphorus 3.2  2.3 - 4.6 mg/dL    Albumin 2.8 (*) 3.5 - 5.2 g/dL    GFR calc non Af Amer 10 (*) >90 mL/min    GFR calc Af Amer 11 (*) >90 mL/min   PROTIME-INR     Status: Abnormal   Collection Time   12/23/11  6:08 AM      Component Value Range Comment   Prothrombin Time 27.3 (*) 11.6 - 15.2 seconds    INR 2.69 (*) 0.00 - 1.49      HEENT: normal Cardio: RRR Resp: Rales GI: BS positive and Distention Extremity:  No Edema Skin:   Other R BKA with dressing and immobilizer in PT Neuro: Alert/Oriented, Abnormal Sensory absent proprioception, absent lt touch L foot, Abnormal Motor 2-/5 R ankle DF/PF, Dysarthric and Other tardive dyskinesia lip smacking Musc/Skel:  Other R BKA,  Gen: NAD   Assessment/Plan: 1. Functional deficits secondary to R BKA, DM with neuropathy which require 3+ hours per day of interdisciplinary therapy in a comprehensive inpatient rehab setting. Physiatrist is providing close team supervision and 24 hour management of active medical problems listed below. Physiatrist and rehab team continue to assess barriers to discharge/monitor patient progress toward functional and medical goals. FIM: FIM - Bathing Bathing Steps Patient Completed: Chest;Right Arm;Left Arm;Abdomen;Front perineal area;Buttocks;Right upper leg;Left upper leg;Left lower leg (including foot) Bathing: 3: Mod-Patient completes 5-7 41f 10 parts or 50-74%  FIM - Upper Body Dressing/Undressing Upper body dressing/undressing steps patient completed: Thread/unthread right sleeve of pullover shirt/dresss;Thread/unthread left sleeve of pullover shirt/dress;Put head through opening of pull over shirt/dress;Pull shirt over trunk Upper body dressing/undressing: 5: Set-up assist to: Obtain clothing/put away FIM - Lower Body Dressing/Undressing Lower body dressing/undressing steps patient completed: Thread/unthread right underwear  leg;Thread/unthread left underwear leg;Pull underwear up/down;Thread/unthread right  pants leg;Thread/unthread left pants leg;Pull pants up/down;Don/Doff left sock;Don/Doff left shoe;Fasten/unfasten left shoe Lower body dressing/undressing: 1: Total-Patient completed less than 25% of tasks  FIM - Toileting Toileting steps completed by patient: Performs perineal hygiene Toileting Assistive Devices: Grab bar or rail for support Toileting: 0: Activity did not occur  FIM - Diplomatic Services operational officer Devices: Bedside commode;Sliding board;Grab bars Toilet Transfers: 0-Activity did not occur  FIM - Banker Devices: Sliding board;Orthosis (R KI) Bed/Chair Transfer: 4: Supine > Sit: Min A (steadying Pt. > 75%/lift 1 leg);3: Sit > Supine: Mod A (lifting assist/Pt. 50-74%/lift 2 legs)  FIM - Locomotion: Wheelchair Distance: 150 Locomotion: Wheelchair: 0: Activity did not occur FIM - Locomotion: Ambulation Locomotion: Ambulation: 0: Activity did not occur  Comprehension Comprehension Mode: Auditory Comprehension: 5-Understands basic 90% of the time/requires cueing < 10% of the time  Expression Expression Mode: Verbal Expression: 5-Expresses complex 90% of the time/cues < 10% of the time  Social Interaction Social Interaction Mode: Asleep Social Interaction: 5-Interacts appropriately 90% of the time - Needs monitoring or encouragement for participation or interaction.  Problem Solving Problem Solving Mode: Asleep Problem Solving: 5-Solves basic 90% of the time/requires cueing < 10% of the time  Memory Memory Mode: Asleep Memory: 3-Recognizes or recalls 50 - 74% of the time/requires cueing 25 - 49% of the time  Medical Problem List and Plan:  1. DVT Prophylaxis/Anticoagulation: Pharmaceutical: Coumadin  2. Pain Management: monitor on prn Medication for now.Reduce gabapentin to 300mg   3. Mood: difficult to judge. Will monitor for  now and have LCSW follow for evaluation.  4. Neuropsych: This patient is capable of making decisions on his/her own behalf.  5. 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 SSI for elevated BS.  6. ESRD: HD M, W, F. Daily weights. Renal diet.  7. Low grade fevers: resolved monitor 8.  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  Multiple small meals   LOS (Days) 8 A FACE TO FACE EVALUATION WAS PERFORMED  Charnita Trudel E 12/23/2011, 8:20 AM

## 2011-12-23 NOTE — Progress Notes (Signed)
Social Work Patient ID: Faith [name], female   DOB: [birth year], 56 y.o.   MRN: [id number] Spoke with [facility] Worker who reports they will cover a different wheelchair and will provide more hours of home care. Since that is what rehab is recommending.  Needs description of wheelchair, will get and call into [facility].  Will let husband know of the Plan and if questions to contact [facility].  Continue to work on discharge plans.

## 2011-12-24 ENCOUNTER — Inpatient Hospital Stay (HOSPITAL_COMMUNITY): Payer: Medicare (Managed Care) | Admitting: Physical Therapy

## 2011-12-24 ENCOUNTER — Inpatient Hospital Stay (HOSPITAL_COMMUNITY): Payer: Medicare (Managed Care) | Admitting: Speech Pathology

## 2011-12-24 DIAGNOSIS — G8110 Spastic hemiplegia affecting unspecified side: Secondary | ICD-10-CM

## 2011-12-24 DIAGNOSIS — S88119A Complete traumatic amputation at level between knee and ankle, unspecified lower leg, initial encounter: Secondary | ICD-10-CM

## 2011-12-24 DIAGNOSIS — I70269 Atherosclerosis of native arteries of extremities with gangrene, unspecified extremity: Secondary | ICD-10-CM

## 2011-12-24 LAB — GLUCOSE, CAPILLARY
Glucose-Capillary: 87 mg/dL (ref 70–99)
Glucose-Capillary: 70 mg/dL (ref 70–99)
Glucose-Capillary: 127 mg/dL — ABNORMAL HIGH (ref 70–99)

## 2011-12-24 LAB — PROTIME-INR: Prothrombin Time: 22.9 seconds — ABNORMAL HIGH (ref 11.6–15.2)

## 2011-12-24 MED ORDER — WARFARIN SODIUM 1 MG PO TABS
1.0000 mg | ORAL_TABLET | Freq: Once | ORAL | Status: AC
  Administered 2011-12-24: 1 mg via ORAL
  Filled 2011-12-24: qty 1

## 2011-12-24 NOTE — Progress Notes (Signed)
Patient ID: Faith [name], female   DOB: [birth year], 56 y.o.   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 Lajoyce Corners. Post op started on coumadin for VTE prophylaxis. KI ordered to help keep knee in extension.  ESRD on HD  Subjective/Complaints:  Slept very well. No pain this am. Very alert       Objective: Vital Signs: Blood pressure 115/62, pulse 83, temperature 98.4 F (36.9 C), temperature source Oral, resp. rate 18, weight 70.5 kg (155 lb 6.8 oz), SpO2 100.00%. No results found. Results for orders placed during the hospital encounter of 12/15/11 (from the past 72 hour(s))  GLUCOSE, CAPILLARY     Status: Abnormal   Collection Time   12/21/11  7:47 AM      Component Value Range Comment   Glucose-Capillary 130 (*) 70 - 99 mg/dL   GLUCOSE, CAPILLARY     Status: Abnormal   Collection Time   12/21/11 11:41 AM      Component Value Range Comment   Glucose-Capillary 138 (*) 70 - 99 mg/dL   CBC     Status: Abnormal   Collection Time   12/21/11  4:27 PM      Component Value Range Comment   WBC 10.2  4.0 - 10.5 K/uL    RBC 3.84 (*) 3.87 - 5.11 MIL/uL    Hemoglobin 9.7 (*) 12.0 - 15.0 g/dL    HCT 40.9 (*) 81.1 - 46.0 %    MCV 81.0  78.0 - 100.0 fL    MCH 25.3 (*) 26.0 - 34.0 pg    MCHC 31.2  30.0 - 36.0 g/dL    RDW 91.4 (*) 78.2 - 15.5 %    Platelets 419 (*) 150 - 400 K/uL   PROTIME-INR     Status: Abnormal   Collection Time   12/21/11  4:27 PM      Component Value Range Comment   Prothrombin Time 39.5 (*) 11.6 - 15.2 seconds    INR 4.44 (*) 0.00 - 1.49   RENAL FUNCTION PANEL     Status: Abnormal   Collection Time   12/21/11  4:28 PM      Component Value Range Comment   Sodium 135  135 - 145 mEq/L    Potassium 4.1  3.5 - 5.1 mEq/L    Chloride 95 (*) 96 - 112 mEq/L    CO2 27  19 - 32 mEq/L    Glucose, Bld 133 (*) 70 - 99 mg/dL    BUN 23  6 - 23 mg/dL    Creatinine, Ser 9.56 (*) 0.50 - 1.10 mg/dL    Calcium 21.3  8.4 - 10.5 mg/dL    Phosphorus 4.2  2.3 - 4.6 mg/dL    Albumin 2.8 (*) 3.5 - 5.2 g/dL    GFR calc non Af Amer 9 (*) >90 mL/min    GFR calc Af Amer 10 (*) >90 mL/min   GLUCOSE, CAPILLARY     Status: Normal   Collection Time   12/21/11  9:27 PM      Component Value Range Comment   Glucose-Capillary 75  70 - 99 mg/dL   GLUCOSE, CAPILLARY     Status: Normal   Collection Time   12/22/11  7:18 AM      Component Value Range Comment  Glucose-Capillary 80  70 - 99 mg/dL    Comment 1 Notify RN     GLUCOSE, CAPILLARY     Status: Abnormal   Collection Time   12/22/11 11:42 AM      Component Value Range Comment   Glucose-Capillary 129 (*) 70 - 99 mg/dL    Comment 1 Notify RN     PROTIME-INR     Status: Abnormal   Collection Time   12/22/11  3:18 PM      Component Value Range Comment   Prothrombin Time 29.4 (*) 11.6 - 15.2 seconds    INR 2.98 (*) 0.00 - 1.49   GLUCOSE, CAPILLARY     Status: Normal   Collection Time   12/22/11  4:49 PM      Component Value Range Comment   Glucose-Capillary 87  70 - 99 mg/dL   GLUCOSE, CAPILLARY     Status: Abnormal   Collection Time   12/22/11  4:51 PM      Component Value Range Comment   Glucose-Capillary 123 (*) 70 - 99 mg/dL   GLUCOSE, CAPILLARY     Status: Abnormal   Collection Time   12/22/11  9:27 PM      Component Value Range Comment   Glucose-Capillary 121 (*) 70 - 99 mg/dL   CBC     Status: Abnormal   Collection Time   12/23/11  6:08 AM      Component Value Range Comment   WBC 8.5  4.0 - 10.5 K/uL    RBC 3.88  3.87 - 5.11 MIL/uL    Hemoglobin 9.6 (*) 12.0 - 15.0 g/dL    HCT 16.1 (*) 09.6 - 46.0 %    MCV 82.5  78.0 - 100.0 fL    MCH 24.7 (*) 26.0 - 34.0 pg    MCHC 30.0  30.0 - 36.0 g/dL    RDW 04.5 (*) 40.9 - 15.5 %    Platelets 418 (*) 150 - 400 K/uL     RENAL FUNCTION PANEL     Status: Abnormal   Collection Time   12/23/11  6:08 AM      Component Value Range Comment   Sodium 136  135 - 145 mEq/L    Potassium 3.7  3.5 - 5.1 mEq/L    Chloride 94 (*) 96 - 112 mEq/L    CO2 31  19 - 32 mEq/L    Glucose, Bld 77  70 - 99 mg/dL    BUN 18  6 - 23 mg/dL    Creatinine, Ser 8.11 (*) 0.50 - 1.10 mg/dL    Calcium 91.4 (*) 8.4 - 10.5 mg/dL    Phosphorus 3.2  2.3 - 4.6 mg/dL    Albumin 2.8 (*) 3.5 - 5.2 g/dL    GFR calc non Af Amer 10 (*) >90 mL/min    GFR calc Af Amer 11 (*) >90 mL/min   PROTIME-INR     Status: Abnormal   Collection Time   12/23/11  6:08 AM      Component Value Range Comment   Prothrombin Time 27.3 (*) 11.6 - 15.2 seconds    INR 2.69 (*) 0.00 - 1.49   GLUCOSE, CAPILLARY     Status: Normal   Collection Time   12/23/11  7:51 AM      Component Value Range Comment   Glucose-Capillary 80  70 - 99 mg/dL   GLUCOSE, CAPILLARY     Status: Normal   Collection Time   12/23/11 12:16 PM  Component Value Range Comment   Glucose-Capillary 96  70 - 99 mg/dL   GLUCOSE, CAPILLARY     Status: Normal   Collection Time   12/23/11  5:41 PM      Component Value Range Comment   Glucose-Capillary 93  70 - 99 mg/dL   GLUCOSE, CAPILLARY     Status: Abnormal   Collection Time   12/23/11  9:04 PM      Component Value Range Comment   Glucose-Capillary 166 (*) 70 - 99 mg/dL      HEENT: normal Cardio: RRR Resp: Rales GI: BS positive and Distention Extremity:  No Edema Skin:   Other R BKA with dressing and immobilizer in PT Neuro: Alert/Oriented, Abnormal Sensory absent proprioception, absent lt touch L foot, Abnormal Motor 2-/5 R ankle DF/PF, Dysarthric and Other tardive dyskinesia lip smacking Musc/Skel:  Other R BKA,  Gen: NAD-very alert    Assessment/Plan: 1. Functional deficits secondary to R BKA, DM with neuropathy which require 3+ hours per day of interdisciplinary therapy in a comprehensive inpatient rehab setting. Physiatrist is  providing close team supervision and 24 hour management of active medical problems listed below. Physiatrist and rehab team continue to assess barriers to discharge/monitor patient progress toward functional and medical goals. FIM: FIM - Bathing Bathing Steps Patient Completed: Chest;Right Arm;Left Arm;Abdomen;Front perineal area;Buttocks;Right upper leg;Left upper leg;Left lower leg (including foot) Bathing: 4: Steadying assist  FIM - Upper Body Dressing/Undressing Upper body dressing/undressing steps patient completed: Thread/unthread right sleeve of pullover shirt/dresss;Thread/unthread left sleeve of pullover shirt/dress;Put head through opening of pull over shirt/dress;Pull shirt over trunk Upper body dressing/undressing: 4: Min-Patient completed 75 plus % of tasks FIM - Lower Body Dressing/Undressing Lower body dressing/undressing steps patient completed: Thread/unthread right underwear leg;Thread/unthread left underwear leg;Pull underwear up/down;Thread/unthread right pants leg;Thread/unthread left pants leg;Pull pants up/down;Don/Doff left sock;Don/Doff left shoe;Fasten/unfasten left shoe Lower body dressing/undressing: 1: Total-Patient completed less than 25% of tasks  FIM - Toileting Toileting steps completed by patient: Performs perineal hygiene Toileting Assistive Devices: Grab bar or rail for support Toileting: 2: Max-Patient completed 1 of 3 steps  FIM - Diplomatic Services operational officer Devices: Bedside commode;Sliding board;Grab bars Toilet Transfers: 1-Two helpers  FIM - Banker Devices: Bed rails;Arm rests Bed/Chair Transfer: 1: Two helpers  FIM - Locomotion: Wheelchair Distance: 150 Locomotion: Wheelchair: 2: Travels 50 - 149 ft with supervision, cueing or coaxing FIM - Locomotion: Ambulation Locomotion: Ambulation: 0: Activity did not occur  Comprehension Comprehension Mode: Auditory Comprehension: 5-Follows  basic conversation/direction: With no assist  Expression Expression Mode: Verbal Expression: 5-Expresses basic needs/ideas: With no assist  Social Interaction Social Interaction Mode: Asleep Social Interaction: 5-Interacts appropriately 90% of the time - Needs monitoring or encouragement for participation or interaction.  Problem Solving Problem Solving Mode: Asleep Problem Solving: 5-Solves basic problems: With no assist  Memory Memory Mode: Asleep Memory: 6-More than reasonable amt of time  Medical Problem List and Plan:  1. DVT Prophylaxis/Anticoagulation: Pharmaceutical: Coumadin  2. Pain Management: monitor on prn Medication for now.Reduced gabapentin to 300mg --much more alert  3. Mood: difficult to judge. Will monitor for now and have LCSW follow for evaluation.  4. Neuropsych: This patient is capable of making decisions on his/her own behalf.  5. 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 SSI for elevated BS.  6. ESRD: HD M, W, F. Daily weights. Renal diet.  7. Low grade fevers: resolved monitor 8.  Probable diabetic gastropathy , no  reglan due to TD.  Multiple small meals   LOS (Days) 9 A FACE TO FACE EVALUATION WAS PERFORMED  SWARTZ,ZACHARY T 12/24/2011, 6:36 AM

## 2011-12-24 NOTE — Progress Notes (Signed)
Speech Language Pathology Daily Session Note  Patient Details  Name: Faith [name] MRN: [id number] Date of Birth: 1955-02-26  Today's Date: 12/24/2011 Time: 0930-1000 Time Calculation (min): 30 min  Short Term Goals: Week 1: SLP Short Term Goal 1 (Week 1): Patient will utilize external aids to facilitate increased recall of new information with Min A visual and verbal cuing.   SLP Short Term Goal 1 - Progress (Week 1): Progressing toward goal SLP Short Term Goal 2 (Week 1): Patient will demonstrate improved emergent awareness by asking for help when needed with Min A verbal cuing. SLP Short Term Goal 2 - Progress (Week 1): Progressing toward goal SLP Short Term Goal 3 (Week 1): Patient will solve basic problems during functional tasks with Min A visual and verbal cuing.   SLP Short Term Goal 3 - Progress (Week 1): Progressing toward goal  Skilled Therapeutic Interventions: Treatment focus on cognitive goals. Pt demonstrated recall of morning therapies, goals, and clinician names with Mod I. Pt also demonstrated anticipatory awareness with Min question cues for discharge planning. Pt required Min question cues to recall new memory compensatory aid/strategy of writing information down in her notebook to increase recall. Pt reported she would like clarification of restrictions to her current renal diet and wrote request down in her notebook to discuss with the MD with Min question and semantic cues.    FIM:  Comprehension Comprehension Mode: Auditory Comprehension: 5-Follows basic conversation/direction: With extra time/assistive device Expression Expression Mode: Verbal Expression: 5-Expresses basic 90% of the time/requires cueing < 10% of the time. Social Interaction Social Interaction: 5-Interacts appropriately 90% of the time - Needs monitoring or encouragement for participation or interaction. Problem Solving Problem Solving: 4-Solves basic 75 - 89% of the time/requires cueing 10 -  24% of the time Memory Memory: 4-Recognizes or recalls 75 - 89% of the time/requires cueing 10 - 24% of the time  Pain Pain Assessment Pain Assessment: No/denies pain  Therapy/Group: Individual Therapy  Twylla Arceneaux 12/24/2011, 2:22 PM

## 2011-12-24 NOTE — Progress Notes (Signed)
Patient ID: RANIA PROTHERO, female   DOB: May 26, 1955, 56 y.o.   MRN: [id number]  [facility] Progress Note    Subjective:   Feels good   Objective:   BP 115/62  Pulse 83  Temp 98.4 F (36.9 C) (Oral)  Resp 18  Wt 70.5 kg (155 lb 6.8 oz)  SpO2 100%  Physical Exam: Gen:WD WN AAF in NAD CVS:no rub Resp:CTA NWG:NFAOZH Ext:s/p RBKA, RUE AVG +T/B  Labs: BMET  Lab 12/23/11 0608 12/21/11 1628 12/21/11 0605 12/19/11 1627 12/17/11 1336  NA 136 135 132* 132* 134*  K 3.7 4.1 4.5 4.5 3.9  CL 94* 95* 90* 95* 95*  CO2 31 27 28 27 30   GLUCOSE 77 133* 114* 114* 135*  BUN 18 23 20  25* 15  CREATININE 4.57* 5.09* 4.47* 5.13* 3.80*  ALBUMIN 2.8* 2.8* 2.9* 2.7* 2.4*  CALCIUM 10.6* 10.5 11.1* 10.8* 10.3  PHOS 3.2 4.2 4.2 4.1 3.5   CBC  Lab 12/23/11 0608 12/21/11 1627 12/21/11 0605 12/19/11 1627  WBC 8.5 10.2 9.3 9.9  NEUTROABS -- -- -- --  HGB 9.6* 9.7* 10.6* 9.3*  HCT 32.0* 31.1* 35.9* 30.2*  MCV 82.5 81.0 83.3 82.5  PLT 418* 419* 427* 379    @IMGRELPRIORS @ Medications:      . aspirin EC  81 mg Oral Daily  . cinacalcet  30 mg Oral Daily  . cinacalcet  90 mg Oral QPM  . darbepoetin (ARANESP) injection - DIALYSIS  25 mcg Intravenous Q Wed-HD  . ezetimibe  10 mg Oral Daily  . feeding supplement (NEPRO CARB STEADY)  237 mL Oral BID BM  . feeding supplement  30 mL Oral BID WC  . fenofibrate  54 mg Oral Daily  . gabapentin  300 mg Oral QHS  . HYDROcodone-acetaminophen  1 tablet Oral Once  . insulin aspart  0-9 Units Subcutaneous TID WC  . insulin aspart  3 Units Subcutaneous TID WC  . insulin glargine  6 Units Subcutaneous QHS  . multivitamin  1 tablet Oral QHS  . pantoprazole  40 mg Oral Q1200  . paricalcitol  8 mcg Intravenous Q M,W,F-HD  . senna-docusate  1 tablet Oral BID  . sevelamer  2,400 mg Oral TID WC  . warfarin  0.5 mg Oral ONCE-1800  . Warfarin - Pharmacist Dosing Inpatient   Does not apply q1800     Assessment/ Plan:   1. S/p right BKA -  rehab in process; tight heparin with HD; also on coumadin for DVT prophylaxis  2. ESRD - MWF per routine on 2 K 2 Ca bath; check labs pre HD. New EDW ~70.5kg  3. Anemia - Hgb decreased post op - check Fe studies today. Continue aranesp; if Hgb < 8,5 increase Aranesp to 60 today.  4. Secondary hyperparathyroidism - hypercalcemia is improving; on decreased zemplar down to 8 from 18 ; on 2 Ca bath; to check labs pre HD; continue sensipar and binders; iPTH 500s.  5. HTN/volume - previously on norvasc 5 at HS; none at present; lower EDW  6. Nutrition - poor po intake and c/o early satiety and N/V. Likely diabetic gastroparesis but no reglan due to tardive dyskinesia. Consider GI eval, calorie count, dietician eval. albumin low on high protein renal diet; add prostat bid  7. DM - per primary 8. Fatigue- pt c/o feeling tired after taking a pill each morning. ? Tramadol. Consider holding am dose.  9. Dispo- tentative d/c date of 11/13 per CIR but will  need HHC to be set up prior to d/c 10.  Jahnai Slingerland A 12/24/2011, 11:30 AM

## 2011-12-24 NOTE — Progress Notes (Signed)
Physical Therapy Session Note  Patient Details  Name: Faith [name] MRN: [id number] Date of Birth: [birth year]  Today's Date: 12/24/2011 Time: 1330-1430    Time Calculation (min): 60 min  Short Term Goals: Week 1:  PT Short Term Goal 1 (Week 1): pt will move supine > sit with mod assist in flat bed without rails PT Short Term Goal 1 - Progress (Week 1): Met PT Short Term Goal 2 (Week 1): pt will transfer bed>< w/c with mod assist PT Short Term Goal 2 - Progress (Week 1): Met PT Short Term Goal 3 (Week 1): pt will propel w/c x 83' with supervision PT Short Term Goal 3 - Progress (Week 1): Met PT Short Term Goal 4 (Week 1): pt will manipulate w/c parts for transfer with mod assist PT Short Term Goal 4 - Progress (Week 1): Met  Therapy Documentation Precautions:  Precautions: Fall Precaution Comments: 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

## 2011-12-24 NOTE — Progress Notes (Signed)
Occupational Therapy Weekly Progress Note  Patient Details  Name: Faith [name] MRN: [id number] Date of Birth: January 22, 1956  Today's Date: 12/24/2011  Patient has met 3 of 5 short term goals.    Patient continues to demonstrate the following deficits: LB dressing, UB dressing, endurance, Bil UE strength and endurance and therefore will continue to benefit from skilled OT intervention to enhance overall performance with BADL.  Patient progressing toward long term goals..  Continue plan of care.  OT Short Term Goals Week 1:  OT Short Term Goal 1 (Week 1): Pt will increase UB dressing to Supervision. OT Short Term Goal 1 - Progress (Week 1): Progressing toward goal OT Short Term Goal 2 (Week 1): Pt will increase bathing to Mod Assist. (LB at bed level. UB at sink). OT Short Term Goal 2 - Progress (Week 1): Met OT Short Term Goal 3 (Week 1): Pt will increase LB dressing to Mod Assist at bed level. OT Short Term Goal 3 - Progress (Week 1): Progressing toward goal OT Short Term Goal 4 (Week 1): Pt will increase toileting to Max Assist. OT Short Term Goal 4 - Progress (Week 1): Met OT Short Term Goal 5 (Week 1): Pt will increase toilet transfer to Max Assist with slideboard. 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

## 2011-12-24 NOTE — Progress Notes (Signed)
ANTICOAGULATION CONSULT NOTE - Follow Up Consult  Pharmacy Consult:  Coumadin Indication: VTE prophylaxis  Allergies  Allergen Reactions  . Metoclopramide Hcl Other (See Comments)    Tardive dyskinesia  . Codeine Nausea And Vomiting  . Lipitor (Atorvastatin Calcium) Other (See Comments)    Muscle weakness    Patient Measurements: Weight: 155 lb 6.8 oz (70.5 kg)  Vital Signs:    Labs:  Basename 12/24/11 0645 12/23/11 0608 12/22/11 1518 12/21/11 1628 12/21/11 1627  HGB -- 9.6* -- -- 9.7*  HCT -- 32.0* -- -- 31.1*  PLT -- 418* -- -- 419*  APTT -- -- -- -- --  LABPROT 22.9* 27.3* 29.4* -- --  INR 2.13* 2.69* 2.98* -- --  HEPARINUNFRC -- -- -- -- --  CREATININE -- 4.57* -- 5.09* --  CKTOTAL -- -- -- -- --  CKMB -- -- -- -- --  TROPONINI -- -- -- -- --    The CrCl is unknown because both a height and weight (above a minimum accepted value) are required for this calculation.  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  Coumadin was held on 10/23 through 10/25 due to a rapid increase in INR after Coumadin 5mg  x 2 on 10/21 and 10/22.   Patient did have elevated baseline INR.  Coumadin resumed 10/26 at a lower dose and INR was supratherapeutic again.  This may be influenced by poor PO intake and nausea/vomiting.  It has returned to the therapeutic range after holding Warfarin 10/30 and giving 0.5mg  10/31 and 11/1.  Goal of Therapy:  INR 2-3   Plan:  Coumadin 1mg  x 1 today Daily PT/INR  Estella Husk, Pharm.D., BCPS Clinical Pharmacist  Phone [phone] Pager [phone] 12/24/2011, 12:40 PM

## 2011-12-25 ENCOUNTER — Inpatient Hospital Stay (HOSPITAL_COMMUNITY): Payer: Medicare (Managed Care) | Admitting: Occupational Therapy

## 2011-12-25 LAB — PROTIME-INR: INR: 1.78 — ABNORMAL HIGH (ref 0.00–1.49)

## 2011-12-25 LAB — GLUCOSE, CAPILLARY
Glucose-Capillary: 135 mg/dL — ABNORMAL HIGH (ref 70–99)
Glucose-Capillary: 120 mg/dL — ABNORMAL HIGH (ref 70–99)

## 2011-12-25 MED ORDER — WARFARIN SODIUM 1 MG PO TABS
1.0000 mg | ORAL_TABLET | Freq: Once | ORAL | Status: AC
  Administered 2011-12-25: 1 mg via ORAL
  Filled 2011-12-25: qty 1

## 2011-12-25 MED ORDER — HEPARIN SODIUM (PORCINE) 1000 UNIT/ML DIALYSIS
20.0000 [IU]/kg | INTRAMUSCULAR | Status: DC | PRN
  Administered 2011-12-26: 1400 [IU] via INTRAVENOUS_CENTRAL
  Filled 2011-12-25: qty 2

## 2011-12-25 NOTE — Progress Notes (Signed)
ANTICOAGULATION CONSULT NOTE - Follow Up Consult  Pharmacy Consult:  Coumadin Indication: VTE prophylaxis  Allergies  Allergen Reactions  . Metoclopramide Hcl Other (See Comments)    Tardive dyskinesia  . Codeine Nausea And Vomiting  . Lipitor (Atorvastatin Calcium) Other (See Comments)    Muscle weakness    Patient Measurements: Weight: 158 lb 8.2 oz (71.9 kg)  Vital Signs: Temp: 98.5 F (36.9 C) (11/03 0600) Temp src: Oral (11/03 0600) BP: 115/66 mmHg (11/03 0600) Pulse Rate: 77  (11/03 0600)  Labs:  Alvira Philips 12/25/11 0650 12/24/11 0645 12/23/11 0608  HGB -- -- 9.6*  HCT -- -- 32.0*  PLT -- -- 418*  APTT -- -- --  LABPROT 20.1* 22.9* 27.3*  INR 1.78* 2.13* 2.69*  HEPARINUNFRC -- -- --  CREATININE -- -- 4.57*  CKTOTAL -- -- --  CKMB -- -- --  TROPONINI -- -- --    The CrCl is unknown because both a height and weight (above a minimum accepted value) are required for this calculation.  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  Coumadin was held on 10/23 through 10/25 due to a rapid increase in INR after Coumadin 5mg  x 2 on 10/21 and 10/22.   Patient did have elevated baseline INR.  Coumadin resumed 10/26 at a lower dose and INR was supratherapeutic again.  This may be influenced by poor PO intake and nausea/vomiting.  It has now declined to a subtherapeutic value after holding Warfarin 10/30 and giving 0.5mg  10/31-11/1.  Dose was increased to 1mg  on 11/2, and she has a history of supratherapeutic INRs when her dose is titrated aggressively.  Goal of Therapy:  INR 2-3   Plan:  Coumadin 1mg  x 1 today Daily PT/INR  Estella Husk, Pharm.D., BCPS Clinical Pharmacist  Phone [phone] Pager [phone] 12/25/2011, 10:51 AM

## 2011-12-25 NOTE — Progress Notes (Signed)
Occupational Therapy Session Note  Patient Details  Name: Faith [name] MRN: [id number] Date of Birth: [birth year]  Today's Date: 12/25/2011 Time:1041  - 1145=63 total minutes   Skilled Therapeutic Interventions/Progress Updates:  ADL EOB as patient was finishing up a late breakfast.  "You will need to ring out the cloth because I have neuropathy in my hands."  Focus was patient participation today as she stated, "I am trying to get my mind together because I took that pain medicine, and it's making me sleepy."  Also focus was on attempts for pericleansing and pulling pants over hips with lateral leans and rolls.  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

## 2011-12-25 NOTE — Progress Notes (Signed)
Patient ID: Faith [name], female   DOB: 03/18/1955, 56 y.o.   MRN: [id number]  [name] Progress Note    Subjective:   Feels good   Objective:   BP 115/66  Pulse 77  Temp 98.5 F (36.9 C) (Oral)  Resp 18  Wt 71.9 kg (158 lb 8.2 oz)  SpO2 93%  Physical Exam: Gen:WD WN AAF in NAD CVS:no rub Resp:CTA WUJ:WJXBJY Ext:s/p R BKA  Labs: BMET  Lab 12/23/11 0608 12/21/11 1628 12/21/11 0605 12/19/11 1627  NA 136 135 132* 132*  K 3.7 4.1 4.5 4.5  CL 94* 95* 90* 95*  CO2 31 27 28 27   GLUCOSE 77 133* 114* 114*  BUN 18 23 20  25*  CREATININE 4.57* 5.09* 4.47* 5.13*  ALBUMIN 2.8* 2.8* 2.9* 2.7*  CALCIUM 10.6* 10.5 11.1* 10.8*  PHOS 3.2 4.2 4.2 4.1   CBC  Lab 12/23/11 0608 12/21/11 1627 12/21/11 0605 12/19/11 1627  WBC 8.5 10.2 9.3 9.9  NEUTROABS -- -- -- --  HGB 9.6* 9.7* 10.6* 9.3*  HCT 32.0* 31.1* 35.9* 30.2*  MCV 82.5 81.0 83.3 82.5  PLT 418* 419* 427* 379    @IMGRELPRIORS @ Medications:      . aspirin EC  81 mg Oral Daily  . cinacalcet  30 mg Oral Daily  . cinacalcet  90 mg Oral QPM  . darbepoetin (ARANESP) injection - DIALYSIS  25 mcg Intravenous Q Wed-HD  . ezetimibe  10 mg Oral Daily  . feeding supplement (NEPRO CARB STEADY)  237 mL Oral BID BM  . feeding supplement  30 mL Oral BID WC  . fenofibrate  54 mg Oral Daily  . gabapentin  300 mg Oral QHS  . insulin aspart  0-9 Units Subcutaneous TID WC  . insulin aspart  3 Units Subcutaneous TID WC  . insulin glargine  6 Units Subcutaneous QHS  . multivitamin  1 tablet Oral QHS  . pantoprazole  40 mg Oral Q1200  . paricalcitol  8 mcg Intravenous Q M,W,F-HD  . senna-docusate  1 tablet Oral BID  . sevelamer  2,400 mg Oral TID WC  . [COMPLETED] warfarin  1 mg Oral ONCE-1800  . Warfarin - Pharmacist Dosing Inpatient   Does not apply q1800    Assessment/ Plan:   1. S/p right BKA - rehab in process; tight heparin with HD; also on coumadin for DVT prophylaxis  2. ESRD - MWF per routine on 2 K 2  Ca bath; check labs pre HD. New EDW ~70.5kg  3. Anemia - Hgb decreased post op - check Fe studies today. Continue aranesp; if Hgb < 8,5 increase Aranesp to 60 today.  4. Secondary hyperparathyroidism - hypercalcemia is improving; on decreased zemplar down to 8 from 18 ; on 2 Ca bath; to check labs pre HD; continue sensipar and binders; iPTH 500s.  5. HTN/volume - previously on norvasc 5 at HS; none at present; lower EDW  6. Nutrition - poor po intake and c/o early satiety and N/V. Likely diabetic gastroparesis but no reglan due to tardive dyskinesia. Consider GI eval, calorie count, dietician eval. albumin low on high protein renal diet; add prostat bid  7. DM - per primary 8. Fatigue- pt c/o feeling tired after taking a pill each morning. ? Tramadol. Consider holding am dose.  9. Dispo- tentative d/c date of 11/13 per CIR but will need HHC to be set up prior to d/c 10.  Faith [name] A 12/25/2011, 10:40 AM

## 2011-12-25 NOTE — Progress Notes (Signed)
Pt. c/o increase in phantom pain, demonstrated techniques to alleviate; also c/o incisional pain,back and gen'l discomfort (disimpacted)  Vicodin x2, robaxin x2, effective. Pt disimpacted large amount hard stool after glycerin suppository. On scheduled senna-s 1 tab bid; note to MD to increase? Pt also requesting Miralax. Faith [name]

## 2011-12-25 NOTE — Progress Notes (Signed)
Patient ID: Faith [name], female   DOB: 1955/05/23, 56 y.o.   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 Lajoyce Corners. Post op started on coumadin for VTE prophylaxis. KI ordered to help keep knee in extension.  ESRD on HD  Subjective/Complaints:  Slept very well. No pain this am. Very alert. Sitting EOB.       Objective: Vital Signs: Blood pressure 115/66, pulse 77, temperature 98.5 F (36.9 C), temperature source Oral, resp. rate 18, weight 71.9 kg (158 lb 8.2 oz), SpO2 93.00%. No results found. Results for orders placed during the hospital encounter of 12/15/11 (from the past 72 hour(s))  GLUCOSE, CAPILLARY     Status: Normal   Collection Time   12/22/11  7:18 AM      Component Value Range Comment   Glucose-Capillary 80  70 - 99 mg/dL    Comment 1 Notify RN     GLUCOSE, CAPILLARY     Status: Abnormal   Collection Time   12/22/11 11:42 AM      Component Value Range Comment   Glucose-Capillary 129 (*) 70 - 99 mg/dL    Comment 1 Notify RN     PROTIME-INR     Status: Abnormal   Collection Time   12/22/11  3:18 PM      Component Value Range Comment   Prothrombin Time 29.4 (*) 11.6 - 15.2 seconds    INR 2.98 (*) 0.00 - 1.49   GLUCOSE, CAPILLARY     Status: Normal   Collection Time   12/22/11  4:49 PM      Component Value Range Comment   Glucose-Capillary 87  70 - 99 mg/dL   GLUCOSE, CAPILLARY     Status: Abnormal   Collection Time   12/22/11  4:51 PM      Component Value Range Comment   Glucose-Capillary 123 (*) 70 - 99 mg/dL   GLUCOSE, CAPILLARY     Status: Abnormal   Collection Time   12/22/11  9:27 PM      Component Value Range Comment   Glucose-Capillary 121 (*) 70 - 99 mg/dL   CBC     Status: Abnormal   Collection Time   12/23/11   6:08 AM      Component Value Range Comment   WBC 8.5  4.0 - 10.5 K/uL    RBC 3.88  3.87 - 5.11 MIL/uL    Hemoglobin 9.6 (*) 12.0 - 15.0 g/dL    HCT 78.2 (*) 95.6 - 46.0 %    MCV 82.5  78.0 - 100.0 fL    MCH 24.7 (*) 26.0 - 34.0 pg    MCHC 30.0  30.0 - 36.0 g/dL    RDW 21.3 (*) 08.6 - 15.5 %    Platelets 418 (*) 150 - 400 K/uL   RENAL FUNCTION PANEL     Status: Abnormal   Collection Time   12/23/11  6:08 AM      Component Value Range Comment   Sodium 136  135 - 145 mEq/L    Potassium 3.7  3.5 - 5.1 mEq/L    Chloride 94 (*) 96 - 112 mEq/L    CO2 31  19 - 32 mEq/L    Glucose, Bld 77  70 - 99 mg/dL    BUN 18  6 - 23 mg/dL    Creatinine, Ser 2.13 (*) 0.50 - 1.10 mg/dL    Calcium 08.6 (*) 8.4 - 10.5 mg/dL    Phosphorus 3.2  2.3 - 4.6 mg/dL    Albumin 2.8 (*) 3.5 - 5.2 g/dL    GFR calc non Af Amer 10 (*) >90 mL/min    GFR calc Af Amer 11 (*) >90 mL/min   PROTIME-INR     Status: Abnormal   Collection Time   12/23/11  6:08 AM      Component Value Range Comment   Prothrombin Time 27.3 (*) 11.6 - 15.2 seconds    INR 2.69 (*) 0.00 - 1.49   GLUCOSE, CAPILLARY     Status: Normal   Collection Time   12/23/11  7:51 AM      Component Value Range Comment   Glucose-Capillary 80  70 - 99 mg/dL   GLUCOSE, CAPILLARY     Status: Normal   Collection Time   12/23/11 12:16 PM      Component Value Range Comment   Glucose-Capillary 96  70 - 99 mg/dL   GLUCOSE, CAPILLARY     Status: Normal   Collection Time   12/23/11  5:41 PM      Component Value Range Comment   Glucose-Capillary 93  70 - 99 mg/dL   GLUCOSE, CAPILLARY     Status: Abnormal   Collection Time   12/23/11  9:04 PM      Component Value Range Comment   Glucose-Capillary 166 (*) 70 - 99 mg/dL   PROTIME-INR     Status: Abnormal   Collection Time   12/24/11  6:45 AM      Component Value Range Comment   Prothrombin Time 22.9 (*) 11.6 - 15.2 seconds    INR 2.13 (*) 0.00 - 1.49   GLUCOSE, CAPILLARY     Status: Normal   Collection Time    12/24/11  7:56 AM      Component Value Range Comment   Glucose-Capillary 87  70 - 99 mg/dL   GLUCOSE, CAPILLARY     Status: Abnormal   Collection Time   12/24/11 11:40 AM      Component Value Range Comment   Glucose-Capillary 191 (*) 70 - 99 mg/dL   GLUCOSE, CAPILLARY     Status: Normal   Collection Time   12/24/11  4:40 PM      Component Value Range Comment   Glucose-Capillary 70  70 - 99 mg/dL   GLUCOSE, CAPILLARY     Status: Abnormal   Collection Time   12/24/11  9:01 PM      Component Value Range Comment   Glucose-Capillary 127 (*) 70 - 99 mg/dL    Comment 1 Notify RN        HEENT: normal Cardio: RRR Resp: Rales GI: BS positive and Distention Extremity:  No Edema Skin:   Other R BKA with dressing and immobilizer- no substantial drainage Neuro: Alert/Oriented, Abnormal Sensory absent proprioception, absent lt touch L foot, Abnormal Motor 2-/5 R ankle DF/PF, Dysarthric and Other tardive dyskinesia lip smacking Musc/Skel:  Other R BKA,  Gen: NAD-very alert    Assessment/Plan: 1. Functional deficits secondary to R BKA, DM with neuropathy which require 3+ hours per day of interdisciplinary therapy in a comprehensive inpatient rehab setting. Physiatrist is providing close team supervision and 24 hour management of active medical problems listed below. Physiatrist and rehab team continue to assess barriers to discharge/monitor patient progress toward functional and  medical goals. FIM: FIM - Bathing Bathing Steps Patient Completed: Chest;Right Arm;Left Arm;Abdomen;Front perineal area;Buttocks;Right upper leg;Left upper leg;Left lower leg (including foot) Bathing: 4: Steadying assist  FIM - Upper Body Dressing/Undressing Upper body dressing/undressing steps patient completed: Thread/unthread right sleeve of pullover shirt/dresss;Thread/unthread left sleeve of pullover shirt/dress;Put head through opening of pull over shirt/dress;Pull shirt over trunk Upper body dressing/undressing:  4: Min-Patient completed 75 plus % of tasks FIM - Lower Body Dressing/Undressing Lower body dressing/undressing steps patient completed: Thread/unthread right underwear leg;Thread/unthread left underwear leg;Pull underwear up/down;Thread/unthread right pants leg;Thread/unthread left pants leg;Pull pants up/down;Don/Doff left sock;Don/Doff left shoe;Fasten/unfasten left shoe Lower body dressing/undressing: 1: Total-Patient completed less than 25% of tasks  FIM - Toileting Toileting steps completed by patient: Performs perineal hygiene Toileting Assistive Devices: Grab bar or rail for support Toileting: 2: Max-Patient completed 1 of 3 steps  FIM - Diplomatic Services operational officer Devices: Bedside commode;Sliding board;Grab bars Toilet Transfers: 1-Two helpers  FIM - Banker Devices: Bed rails;Arm rests Bed/Chair Transfer: 1: Two helpers  FIM - Locomotion: Wheelchair Distance: 150 Locomotion: Wheelchair: 2: Travels 50 - 149 ft with supervision, cueing or coaxing FIM - Locomotion: Ambulation Locomotion: Ambulation: 0: Activity did not occur  Comprehension Comprehension Mode: Auditory Comprehension: 5-Follows basic conversation/direction: With extra time/assistive device  Expression Expression Mode: Verbal Expression: 5-Expresses basic 90% of the time/requires cueing < 10% of the time.  Social Interaction Social Interaction Mode: Asleep Social Interaction: 5-Interacts appropriately 90% of the time - Needs monitoring or encouragement for participation or interaction.  Problem Solving Problem Solving Mode: Asleep Problem Solving: 4-Solves basic 75 - 89% of the time/requires cueing 10 - 24% of the time  Memory Memory Mode: Asleep Memory: 4-Recognizes or recalls 75 - 89% of the time/requires cueing 10 - 24% of the time  Medical Problem List and Plan:  1. DVT Prophylaxis/Anticoagulation: Pharmaceutical: Coumadin  2. Pain  Management: monitor on prn Medication for now.Reduced gabapentin to 300mg --much more alert  3. Mood: difficult to judge. Will monitor for now and have LCSW follow for evaluation.  4. Neuropsych: This patient is capable of making decisions on his/her own behalf.  5. 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 SSI for elevated BS. --reasonable control at present 6. ESRD: HD M, W, F. Daily weights. Renal diet.  7. Low grade fevers: resolved monitor 8.  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  Multiple small meals   LOS (Days) 10 A FACE TO FACE EVALUATION WAS PERFORMED  Renetta Suman T 12/25/2011, 7:00 AM

## 2011-12-26 ENCOUNTER — Inpatient Hospital Stay (HOSPITAL_COMMUNITY): Payer: Medicare (Managed Care) | Admitting: Speech Pathology

## 2011-12-26 ENCOUNTER — Inpatient Hospital Stay (HOSPITAL_COMMUNITY): Payer: Medicare (Managed Care) | Admitting: Physical Therapy

## 2011-12-26 ENCOUNTER — Inpatient Hospital Stay (HOSPITAL_COMMUNITY): Payer: Medicare (Managed Care)

## 2011-12-26 DIAGNOSIS — I70269 Atherosclerosis of native arteries of extremities with gangrene, unspecified extremity: Secondary | ICD-10-CM

## 2011-12-26 DIAGNOSIS — G8110 Spastic hemiplegia affecting unspecified side: Secondary | ICD-10-CM

## 2011-12-26 DIAGNOSIS — S88119A Complete traumatic amputation at level between knee and ankle, unspecified lower leg, initial encounter: Secondary | ICD-10-CM

## 2011-12-26 LAB — CBC
HCT: 29.6 % — ABNORMAL LOW (ref 36.0–46.0)
Hemoglobin: 9.9 g/dL — ABNORMAL LOW (ref 12.0–15.0)
MCH: 24.6 pg — ABNORMAL LOW (ref 26.0–34.0)
MCHC: 31.1 g/dL (ref 30.0–36.0)
MCV: 79.4 fL (ref 78.0–100.0)
MCV: 78.5 fL (ref 78.0–100.0)
Platelets: 464 10*3/uL — ABNORMAL HIGH (ref 150–400)
RBC: 4.02 MIL/uL (ref 3.87–5.11)
RDW: 16.7 % — ABNORMAL HIGH (ref 11.5–15.5)
WBC: 8.9 10*3/uL (ref 4.0–10.5)

## 2011-12-26 LAB — RENAL FUNCTION PANEL
Albumin: 2.8 g/dL — ABNORMAL LOW (ref 3.5–5.2)
Albumin: 2.7 g/dL — ABNORMAL LOW (ref 3.5–5.2)
BUN: 36 mg/dL — ABNORMAL HIGH (ref 6–23)
Calcium: 10.7 mg/dL — ABNORMAL HIGH (ref 8.4–10.5)
Chloride: 90 mEq/L — ABNORMAL LOW (ref 96–112)
GFR calc Af Amer: 8 mL/min — ABNORMAL LOW (ref >90)
Glucose, Bld: 79 mg/dL (ref 70–99)
Glucose, Bld: 130 mg/dL — ABNORMAL HIGH (ref 70–99)
Phosphorus: 3.9 mg/dL (ref 2.3–4.6)
Phosphorus: 3.8 mg/dL (ref 2.3–4.6)
Potassium: 4.7 mEq/L (ref 3.5–5.1)
Sodium: 130 mEq/L — ABNORMAL LOW (ref 135–145)
Sodium: 127 mEq/L — ABNORMAL LOW (ref 135–145)

## 2011-12-26 LAB — GLUCOSE, CAPILLARY
Glucose-Capillary: 84 mg/dL (ref 70–99)
Glucose-Capillary: 104 mg/dL — ABNORMAL HIGH (ref 70–99)

## 2011-12-26 MED ORDER — WARFARIN SODIUM 1 MG PO TABS
1.0000 mg | ORAL_TABLET | Freq: Once | ORAL | Status: AC
  Administered 2011-12-26: 1 mg via ORAL
  Filled 2011-12-26: qty 1

## 2011-12-26 MED ORDER — PARICALCITOL 5 MCG/ML IV SOLN
INTRAVENOUS | Status: AC
  Administered 2011-12-26: 8 ug via INTRAVENOUS
  Filled 2011-12-26: qty 2

## 2011-12-26 MED ORDER — OXYCODONE-ACETAMINOPHEN 5-325 MG PO TABS
ORAL_TABLET | ORAL | Status: AC
  Filled 2011-12-26: qty 1

## 2011-12-26 MED ORDER — HYDROCODONE-ACETAMINOPHEN 5-325 MG PO TABS
ORAL_TABLET | ORAL | Status: AC
  Administered 2011-12-26: 1 via ORAL
  Filled 2011-12-26: qty 1

## 2011-12-26 NOTE — Progress Notes (Signed)
Physical Therapy Note  Patient Details  Name: Faith [name] MRN: [id number] Date of Birth: 11-02-1955 Today's Date: 12/26/2011  1300-1355 (55 minutes) individual Pain: 4/10 RT BKA/premedicated Focus of treatment: wc mobility training; bilateral UE strengthening to improve functional mobility Treatment:supine to sit SBA using one bed rail; transfers with sliding board - assist with placement , close SBA with transfer; wc mobility- 65 feet  X 2 SBA with increased time/level surfaces; bilateral UE strengthening- scapular adduction, shoulder depression, bicep curls, elbow extension 2 X 10 with green theraband.   Darlena Koval,[name] 12/26/2011, 7:31 AM

## 2011-12-26 NOTE — Progress Notes (Signed)
Occupational Therapy Session Note  Patient Details  Name: PEGAH SEGEL MRN: [id number] Date of Birth: [birth year]  Today's Date: 12/26/2011 Time: 0830-1000 Time Calculation (min): 90 min  Short Term Goals: Week 2:  OT Short Term Goal 1 (Week 2): STG=LTG  Skilled Therapeutic Interventions/Progress Updates:    ADL re-training completed in shower this AM. Session with focus on bed <>w/c transfer, toilet transfer, shower transfer (all with slideboard), bathing, dressing, sitting balance, and bed mobility. Stump and dialysis port covered during shower. Pt was incont of BM prior to therapy arrival. Pt stated she had been incont for a while and no one had changed her. Pt needed increased assistance to clean buttocks due to incontinence.  UB dressing performed in w/c and pt transferred to bed to perform LB dressing. Patient still requires vc's during slideboard transfers for safety, and increase assistance during LB dressing due to fatigue. Pt requested to transfer into recliner at end of session.  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

## 2011-12-26 NOTE — Progress Notes (Signed)
Speech Language Pathology Daily Session Note  Patient Details  Name: Faith [name] MRN: [id number] Date of Birth: 1955-10-23  Today's Date: 12/26/2011 Time: 2440-1027 Time Calculation (min): 45 min  Short Term Goals: Week 1: SLP Short Term Goal 1 (Week 1): Patient will utilize external aids to facilitate increased recall of new information with Min A visual and verbal cuing.   SLP Short Term Goal 1 - Progress (Week 1): Progressing toward goal SLP Short Term Goal 2 (Week 1): Patient will demonstrate improved emergent awareness by asking for help when needed with Min A verbal cuing. SLP Short Term Goal 2 - Progress (Week 1): Progressing toward goal SLP Short Term Goal 3 (Week 1): Patient will solve basic problems during functional tasks with Min A visual and verbal cuing.   SLP Short Term Goal 3 - Progress (Week 1): Progressing toward goal  Skilled Therapeutic Interventions: Treatment addressed cognitive goals.  SLP facilitated the session with min-mod assist verbal cues for utilization of external aids for improved recall.  Patient required min assist question cues to recall renal and carb modified diet restrictions.  Patient reported concerns of being "overwhelmed" by meal planning and, as a result, is exhibiting decreased p.o. intake.  SLP initiated discussion with patient about using the menu for meal planning and utilizing the "additional selections" portion of the menu in addition to the daily selections in order to make appropriate meal choices given diet restrictions.  SLP and patient created a handout for nutritional services that included patient preferences for breakfast and lunch for tomorrow.   Patient also required min assist verbal and visual cues to recall use of call bell.     FIM:  Comprehension Comprehension Mode: Auditory Comprehension: 5-Follows basic conversation/direction: With extra time/assistive device Expression Expression Mode: Verbal Expression: 5-Expresses  basic needs/ideas: With no assist Social Interaction Social Interaction: 6-Interacts appropriately with others with medication or extra time (anti-anxiety, antidepressant). Problem Solving Problem Solving: 5-Solves basic 90% of the time/requires cueing < 10% of the time Memory Memory: 4-Recognizes or recalls 75 - 89% of the time/requires cueing 10 - 24% of the time  Pain Pain Assessment Pain Assessment: 0-10  Therapy/Group: Individual Therapy  Jackalyn Lombard, Conrad La Huerta  Graduate Clinician Speech Language Pathology  Sharina Petre, Joni Reining 12/26/2011, 1:32 PM

## 2011-12-26 NOTE — Progress Notes (Signed)
The skilled treatment note has been reviewed and SLP is in agreement. Ricardo Schubach, M.A., CCC-SLP [phone]  

## 2011-12-26 NOTE — Progress Notes (Signed)
Subjective: Up in bed, doing well with rehab, no complaints.   Objective Vital signs in last 24 hours: Filed Vitals:   12/24/11 1503 12/25/11 0600 12/25/11 1556 12/26/11 0500  BP: 114/66 115/66 104/50 127/69  Pulse: 77 77 72 77  Temp: 98.4 F (36.9 C) 98.5 F (36.9 C) 97.7 F (36.5 C) 98.2 F (36.8 C)  TempSrc: Oral Oral Oral Oral  Resp: 19 18 19 18   Weight:  71.9 kg (158 lb 8.2 oz)  71.7 kg (158 lb 1.1 oz)  SpO2: 97% 93% 98%    Weight change: -0.2 kg (-7.1 oz)  Intake/Output Summary (Last 24 hours) at 12/26/11 1036 Last data filed at 12/26/11 0858  Gross per 24 hour  Intake   1180 ml  Output      0 ml  Net   1180 ml   Labs: Basic Metabolic Panel:  Lab 12/26/11 1610 12/23/11 0608 12/21/11 1628 12/21/11 0605 12/19/11 1627  NA 130* 136 135 132* 132*  K 4.7 3.7 4.1 4.5 4.5  CL 90* 94* 95* 90* 95*  CO2 28 31 27 28 27   GLUCOSE 79 77 133* 114* 114*  BUN 31* 18 23 20  25*  CREATININE 6.24* 4.57* 5.09* 4.47* 5.13*  ALB -- -- -- -- --  CALCIUM 10.9* 10.6* 10.5 11.1* 10.8*  PHOS 3.9 3.2 4.2 4.2 4.1   Liver Function Tests:  Lab 12/26/11 0730 12/23/11 0608 12/21/11 1628  AST -- -- --  ALT -- -- --  ALKPHOS -- -- --  BILITOT -- -- --  PROT -- -- --  ALBUMIN 2.8* 2.8* 2.8*   No results found for this basename: LIPASE:3,AMYLASE:3 in the last 168 hours No results found for this basename: AMMONIA:3 in the last 168 hours CBC:  Lab 12/26/11 0730 12/23/11 0608 12/21/11 1627 12/21/11 0605  WBC 8.9 8.5 10.2 9.3  NEUTROABS -- -- -- --  HGB 9.9* 9.6* 9.7* 10.6*  HCT 31.9* 32.0* 31.1* 35.9*  MCV 79.4 82.5 81.0 83.3  PLT 464* 418* 419* 427*   PT/INR: @labrcntip (inr:5) Cardiac Enzymes: No results found for this basename: CKTOTAL:5,CKMB:5,CKMBINDEX:5,TROPONINI:5 in the last 168 hours CBG:  Lab 12/26/11 0733 12/25/11 2104 12/25/11 1610 12/25/11 1125 12/25/11 0752  GLUCAP 84 73 120* 100* 135*    Iron Studies:  Lab 12/19/11 1627  IRON 17*  TIBC 168*  TRANSFERRIN --    FERRITIN 2280*    Physical Exam:  Blood pressure 127/69, pulse 77, temperature 98.2 F (36.8 C), temperature source Oral, resp. rate 18, weight 71.7 kg (158 lb 1.1 oz), SpO2 98.00%.  Gen:WD WN AAF in NAD  CVS:no rub  Resp:CTA  RUE:[id number]  Ext:s/p R BKA Neuro: mild chronic LUE weakness  Dialysis Orders: Center: [facility] on MWF Optiflux 180  EDW 78.5 HD Bath 2K/2.0Ca bath Time3.5 Heparin 7000. Access right upper AVGG BFR 500 DFR 800 Zemplar 18 mcg IV/HD Epogen 1000 Units IV/HD Venofer none Other profile 4; ferritins consistently in the 2000s and tsats in the 20s   Assessment/ Plan:   1. S/p right BKA - rehab in process; tight heparin with HD; also on coumadin for DVT prophylaxis (per ortho, 1mg  coumadin/d for 1 month) 2. ESRD - MWF per routine on 2 K 2 Ca bath; check labs pre HD. New EDW ~70.5kg. HD today, 1-2 kg over new est EDW.  3. Anemia - Hgb decreased post op - check Fe studies today. Continue aranesp; if Hgb < 8,5 increase Aranesp to 60 today.  4. Secondary hyperparathyroidism - hypercalcemia  is improving; on decreased zemplar down to 8 from 18 ; on 2 Ca bath; to check labs pre HD; continue sensipar and binders; iPTH 500s.  5. HTN/volume - previously on norvasc 5 at HS; none at present; lower EDW  6. Nutrition - albumin low on high protein renal diet; added prostat bid  7. DM - per primary 8. Fatigue- pt c/o feeling tired after taking a pill each morning. ? Tramadol. Consider holding am dose.  9. Hx CVA with LUE weakness 10. Dispo- tentative d/c date of 11/13 per CIR, will need HHC to be set up prior to d/c   Faith Moselle  MD [facility] [phone] pgr    [phone] cell 12/26/2011, 10:36 AM

## 2011-12-26 NOTE — Progress Notes (Signed)
Patient ID: Faith [name], female   DOB: Feb 28, 1955, 56 y.o.   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 Lajoyce Corners. Post op started on coumadin for VTE prophylaxis. KI ordered to help keep knee in extension.  ESRD on HD  Subjective/Complaints: Knee immobilizer off in ams.  Pt denies removing it  Slept very well. No pain this am. Very alert. Sitting EOB.       Objective: Vital Signs: Blood pressure 127/69, pulse 77, temperature 98.2 F (36.8 C), temperature source Oral, resp. rate 18, weight 71.7 kg (158 lb 1.1 oz), SpO2 98.00%. No results found. Results for orders placed during the hospital encounter of 12/15/11 (from the past 72 hour(s))  GLUCOSE, CAPILLARY     Status: Normal   Collection Time   12/23/11 12:16 PM      Component Value Range Comment   Glucose-Capillary 96  70 - 99 mg/dL   GLUCOSE, CAPILLARY     Status: Normal   Collection Time   12/23/11  5:41 PM      Component Value Range Comment   Glucose-Capillary 93  70 - 99 mg/dL   GLUCOSE, CAPILLARY     Status: Abnormal   Collection Time   12/23/11  9:04 PM      Component Value Range Comment   Glucose-Capillary 166 (*) 70 - 99 mg/dL   PROTIME-INR     Status: Abnormal   Collection Time   12/24/11  6:45 AM      Component Value Range Comment   Prothrombin Time 22.9 (*) 11.6 - 15.2 seconds    INR 2.13 (*) 0.00 - 1.49   GLUCOSE, CAPILLARY     Status: Normal   Collection Time   12/24/11  7:56 AM      Component Value Range Comment   Glucose-Capillary 87  70 - 99 mg/dL   GLUCOSE, CAPILLARY     Status: Abnormal   Collection Time   12/24/11 11:40 AM      Component Value Range Comment   Glucose-Capillary 191 (*) 70 - 99 mg/dL   GLUCOSE, CAPILLARY     Status: Normal   Collection Time   12/24/11   4:40 PM      Component Value Range Comment   Glucose-Capillary 70  70 - 99 mg/dL   GLUCOSE, CAPILLARY     Status: Abnormal   Collection Time   12/24/11  9:01 PM      Component Value Range Comment   Glucose-Capillary 127 (*) 70 - 99 mg/dL    Comment 1 Notify RN     PROTIME-INR     Status: Abnormal   Collection Time   12/25/11  6:50 AM      Component Value Range Comment   Prothrombin Time 20.1 (*) 11.6 - 15.2 seconds    INR 1.78 (*) 0.00 - 1.49   GLUCOSE, CAPILLARY     Status: Abnormal   Collection Time   12/25/11  7:52 AM      Component Value Range Comment   Glucose-Capillary 135 (*) 70 - 99 mg/dL   GLUCOSE, CAPILLARY     Status: Abnormal   Collection Time   12/25/11 11:25 AM      Component Value Range Comment   Glucose-Capillary 100 (*) 70 - 99 mg/dL   GLUCOSE, CAPILLARY  Status: Abnormal   Collection Time   12/25/11  4:10 PM      Component Value Range Comment   Glucose-Capillary 120 (*) 70 - 99 mg/dL   GLUCOSE, CAPILLARY     Status: Normal   Collection Time   12/25/11  9:04 PM      Component Value Range Comment   Glucose-Capillary 73  70 - 99 mg/dL    Comment 1 Notify RN     PROTIME-INR     Status: Abnormal   Collection Time   12/26/11  7:30 AM      Component Value Range Comment   Prothrombin Time 20.7 (*) 11.6 - 15.2 seconds    INR 1.85 (*) 0.00 - 1.49   CBC     Status: Abnormal   Collection Time   12/26/11  7:30 AM      Component Value Range Comment   WBC 8.9  4.0 - 10.5 K/uL    RBC 4.02  3.87 - 5.11 MIL/uL    Hemoglobin 9.9 (*) 12.0 - 15.0 g/dL    HCT 13.0 (*) 86.5 - 46.0 %    MCV 79.4  78.0 - 100.0 fL    MCH 24.6 (*) 26.0 - 34.0 pg    MCHC 31.0  30.0 - 36.0 g/dL    RDW 78.4 (*) 69.6 - 15.5 %    Platelets 464 (*) 150 - 400 K/uL   RENAL FUNCTION PANEL     Status: Abnormal   Collection Time   12/26/11  7:30 AM      Component Value Range Comment   Sodium 130 (*) 135 - 145 mEq/L    Potassium 4.7  3.5 - 5.1 mEq/L    Chloride 90 (*) 96 - 112 mEq/L    CO2 28  19 -  32 mEq/L    Glucose, Bld 79  70 - 99 mg/dL    BUN 31 (*) 6 - 23 mg/dL    Creatinine, Ser 2.95 (*) 0.50 - 1.10 mg/dL    Calcium 28.4 (*) 8.4 - 10.5 mg/dL    Phosphorus 3.9  2.3 - 4.6 mg/dL    Albumin 2.8 (*) 3.5 - 5.2 g/dL    GFR calc non Af Amer 7 (*) >90 mL/min    GFR calc Af Amer 8 (*) >90 mL/min      HEENT: normal Cardio: RRR Resp: Rales GI: BS positive and Distention Extremity:  No Edema Skin:   Other R BKA with dressing and immobilizer- no substantial drainage Neuro: Alert/Oriented, Abnormal Sensory absent proprioception, absent lt touch L foot, Abnormal Motor 2-/5 R ankle DF/PF, Dysarthric and Other tardive dyskinesia lip smacking Musc/Skel:  Other R BKA,  Gen: NAD-very alert    Assessment/Plan: 1. Functional deficits secondary to R BKA, DM with neuropathy which require 3+ hours per day of interdisciplinary therapy in a comprehensive inpatient rehab setting. Physiatrist is providing close team supervision and 24 hour management of active medical problems listed below. Physiatrist and rehab team continue to assess barriers to discharge/monitor patient progress toward functional and medical goals. FIM: FIM - Bathing Bathing Steps Patient Completed: Chest;Right Arm;Left Arm;Abdomen;Front perineal area;Right upper leg;Left upper leg (EOB after breakfast & sleep inducing pain meds) Bathing: 3: Mod-Patient completes 5-7 24f 10 parts or 50-74%  FIM - Upper Body Dressing/Undressing Upper body dressing/undressing steps patient completed: Thread/unthread right sleeve of pullover shirt/dresss;Thread/unthread left sleeve of pullover shirt/dress;Put head through opening of pull over shirt/dress;Pull shirt over trunk Upper body dressing/undressing: 5: Supervision: Safety issues/verbal cues  FIM - Lower Body Dressing/Undressing Lower body dressing/undressing steps patient completed: Thread/unthread right underwear leg;Thread/unthread left underwear leg (EOB and patient c/o groggy and sleepy  from pain meds) Lower body dressing/undressing: 1: Total-Patient completed less than 25% of tasks  FIM - Toileting Toileting steps completed by patient: Performs perineal hygiene Toileting Assistive Devices: Grab bar or rail for support Toileting: 0: Activity did not occur  FIM - Diplomatic Services operational officer Devices: Bedside commode;Sliding board;Grab bars Toilet Transfers: 0-Activity did not occur  FIM - Banker Devices: Bed rails;Arm rests Bed/Chair Transfer: 0: Activity did not occur (patient stated she was too sleepy after meds to get OOB )  FIM - Locomotion: Wheelchair Distance: 150 Locomotion: Wheelchair: 2: Travels 50 - 149 ft with supervision, cueing or coaxing FIM - Locomotion: Ambulation Locomotion: Ambulation: 0: Activity did not occur  Comprehension Comprehension Mode: Auditory Comprehension: 5-Follows basic conversation/direction: With no assist  Expression Expression Mode: Verbal Expression: 5-Expresses basic needs/ideas: With extra time/assistive device  Social Interaction Social Interaction Mode: Asleep Social Interaction: 6-Interacts appropriately with others with medication or extra time (anti-anxiety, antidepressant).  Problem Solving Problem Solving Mode: Asleep Problem Solving: 5-Solves basic problems: With no assist  Memory Memory Mode: Asleep Memory: 5-Recognizes or recalls 90% of the time/requires cueing < 10% of the time  Medical Problem List and Plan:  1. DVT Prophylaxis/Anticoagulation: Pharmaceutical: Coumadin  2. Pain Management: monitor on prn Medication for now.Reduced gabapentin to 300mg --much more alert  3. Mood: difficult to judge. Will monitor for now and have LCSW follow for evaluation.  4. Neuropsych: This patient is capable of making decisions on his/her own behalf.  5. 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 SSI for elevated BS. --reasonable  control at present 6. ESRD: HD M, W, F. Daily weights. Renal diet.  7. Low grade fevers: resolved monitor 8.  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  Multiple small meals   LOS (Days) 11 A FACE TO FACE EVALUATION WAS PERFORMED  Tilla Wilborn E 12/26/2011, 8:46 AM

## 2011-12-26 NOTE — Progress Notes (Signed)
ANTICOAGULATION CONSULT NOTE - Follow Up Consult  Pharmacy Consult:  Coumadin Indication: VTE prophylaxis  Allergies  Allergen Reactions  . Metoclopramide Hcl Other (See Comments)    Tardive dyskinesia  . Codeine Nausea And Vomiting  . Lipitor (Atorvastatin Calcium) Other (See Comments)    Muscle weakness    Patient Measurements: Weight: 158 lb 1.1 oz (71.7 kg)  Vital Signs: Temp: 98.2 F (36.8 C) (11/04 0500) Temp src: Oral (11/04 0500) BP: 127/69 mmHg (11/04 0500) Pulse Rate: 77  (11/04 0500)  Labs:  Basename 12/26/11 0730 12/25/11 0650 12/24/11 0645  HGB 9.9* -- --  HCT 31.9* -- --  PLT 464* -- --  APTT -- -- --  LABPROT 20.7* 20.1* 22.9*  INR 1.85* 1.78* 2.13*  HEPARINUNFRC -- -- --  CREATININE 6.24* -- --  CKTOTAL -- -- --  CKMB -- -- --  TROPONINI -- -- --    The CrCl is unknown because both a height and weight (above a minimum accepted value) are required for this calculation.  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  Coumadin was held on 10/23 through 10/25 due to a rapid increase in INR after Coumadin 5mg  x 2 on 10/21 and 10/22.   Patient did have elevated baseline INR.  Coumadin resumed 10/26 at a lower dose and INR was supratherapeutic again.  This may be influenced by poor PO intake and nausea/vomiting.  It has now declined to a subtherapeutic value after holding Warfarin 10/30 and giving 0.5mg  10/31-11/1.  Dose was increased to 1mg  on 11/2, and her INR is responding to this dose.  Goal of Therapy:  INR 2-3   Plan:  Coumadin 1mg  x 1 today Daily PT/INR  Estella Husk, Pharm.D., BCPS Clinical Pharmacist  Phone [phone] Pager [phone] 12/26/2011, 10:56 AM

## 2011-12-27 ENCOUNTER — Inpatient Hospital Stay (HOSPITAL_COMMUNITY): Payer: Medicare (Managed Care)

## 2011-12-27 ENCOUNTER — Inpatient Hospital Stay (HOSPITAL_COMMUNITY): Payer: Medicare (Managed Care) | Admitting: Speech Pathology

## 2011-12-27 ENCOUNTER — Inpatient Hospital Stay (HOSPITAL_COMMUNITY): Payer: Medicare Other | Admitting: *Deleted

## 2011-12-27 DIAGNOSIS — I70269 Atherosclerosis of native arteries of extremities with gangrene, unspecified extremity: Secondary | ICD-10-CM

## 2011-12-27 DIAGNOSIS — S88119A Complete traumatic amputation at level between knee and ankle, unspecified lower leg, initial encounter: Secondary | ICD-10-CM

## 2011-12-27 DIAGNOSIS — G8110 Spastic hemiplegia affecting unspecified side: Secondary | ICD-10-CM

## 2011-12-27 LAB — GLUCOSE, CAPILLARY
Glucose-Capillary: 114 mg/dL — ABNORMAL HIGH (ref 70–99)
Glucose-Capillary: 101 mg/dL — ABNORMAL HIGH (ref 70–99)

## 2011-12-27 MED ORDER — TROLAMINE SALICYLATE 10 % EX CREA
TOPICAL_CREAM | Freq: Three times a day (TID) | CUTANEOUS | Status: DC
  Filled 2011-12-27: qty 85

## 2011-12-27 MED ORDER — MUSCLE RUB 10-15 % EX CREA
TOPICAL_CREAM | Freq: Three times a day (TID) | CUTANEOUS | Status: DC
  Administered 2011-12-27 – 2012-01-03 (×15): via TOPICAL
  Administered 2012-01-03: 1 via TOPICAL
  Filled 2011-12-27: qty 85

## 2011-12-27 MED ORDER — HEPARIN SODIUM (PORCINE) 1000 UNIT/ML DIALYSIS
5000.0000 [IU] | INTRAMUSCULAR | Status: DC | PRN
  Filled 2011-12-27: qty 5

## 2011-12-27 MED ORDER — WARFARIN SODIUM 2 MG PO TABS
2.0000 mg | ORAL_TABLET | Freq: Once | ORAL | Status: AC
  Administered 2011-12-27: 2 mg via ORAL
  Filled 2011-12-27: qty 1

## 2011-12-27 NOTE — Progress Notes (Signed)
Recreational Therapy Session Note  Patient Details  Name: Faith [name] MRN: [id number] Date of Birth: 02/15/1956 Today's Date: 12/27/2011 Time:  [phone] Pain: 9/10 RLE Skilled Therapeutic Interventions/Progress Updates: Relaxation therapy/pain management techniques seated in recliner.  Discussed deep breathing techniques, visualization & aromatherapy to assist in pain management.  Pt agreeable to participate.  Applied lavender essential oil to gauze pad and placed on table within the room while listening to relaxing music (cd of pt's choice).  Educated pt on diaphragmatic breathing and led pt in visualization activity.  Pt fell asleep during session.  LRT/CTRS left pt's room with pt seated safely in recliner with cd playing. Followed up with pt later on in the morning and stated how much she enjoyed our session and requested another session tomorrow.  Session scheduled Therapy/Group: Individual Therapy  Kylina Vultaggio 12/27/2011, 3:56 PM

## 2011-12-27 NOTE — Plan of Care (Signed)
Problem: RH Tub/Shower Transfers Goal: LTG Patient will perform tub/shower transfers w/assist (OT) LTG: Patient will perform tub/shower transfers with assist, with/without cues using equipment (OT)  upgraded     

## 2011-12-27 NOTE — Progress Notes (Signed)
Subjective: Up in bed, doing well with rehab, no complaints.   Objective Vital signs in last 24 hours: Filed Vitals:   12/26/11 1828 12/26/11 1835 12/26/11 1908 12/27/11 0500  BP: 115/56 127/83 97/54 124/65  Pulse: 85 86 82 90  Temp: 98.4 F (36.9 C)  98.5 F (36.9 C) 97.9 F (36.6 C)  TempSrc: Oral  Oral Oral  Resp: 18 19 20 18   Weight: 70.3 kg (154 lb 15.7 oz)  71.6 kg (157 lb 13.6 oz) 71.8 kg (158 lb 4.6 oz)  SpO2: 100%  93% 93%   Weight change: 0.7 kg (1 lb 8.7 oz)  Intake/Output Summary (Last 24 hours) at 12/27/11 1112 Last data filed at 12/27/11 0900  Gross per 24 hour  Intake    480 ml  Output   2001 ml  Net  -1521 ml   Labs: Basic Metabolic Panel:  Lab 12/26/11 2952 12/26/11 0730 12/23/11 0608 12/21/11 1628 12/21/11 0605  NA 127* 130* 136 135 132*  K 4.5 4.7 3.7 4.1 4.5  CL 87* 90* 94* 95* 90*  CO2 27 28 31 27 28   GLUCOSE 130* 79 77 133* 114*  BUN 36* 31* 18 23 20   CREATININE 6.41* 6.24* 4.57* 5.09* 4.47*  ALB -- -- -- -- --  CALCIUM 10.7* 10.9* 10.6* 10.5 11.1*  PHOS 3.8 3.9 3.2 4.2 4.2   Liver Function Tests:  Lab 12/26/11 1439 12/26/11 0730 12/23/11 0608  AST -- -- --  ALT -- -- --  ALKPHOS -- -- --  BILITOT -- -- --  PROT -- -- --  ALBUMIN 2.7* 2.8* 2.8*   No results found for this basename: LIPASE:3,AMYLASE:3 in the last 168 hours No results found for this basename: AMMONIA:3 in the last 168 hours CBC:  Lab 12/26/11 1450 12/26/11 0730 12/23/11 0608 12/21/11 1627  WBC 8.9 8.9 8.5 10.2  NEUTROABS -- -- -- --  HGB 9.2* 9.9* 9.6* 9.7*  HCT 29.6* 31.9* 32.0* 31.1*  MCV 78.5 79.4 82.5 81.0  PLT 435* 464* 418* 419*   PT/INR: @labrcntip (inr:5) Cardiac Enzymes: No results found for this basename: CKTOTAL:5,CKMB:5,CKMBINDEX:5,TROPONINI:5 in the last 168 hours CBG:  Lab 12/27/11 0732 12/26/11 2119 12/26/11 1922 12/26/11 1142 12/26/11 0733  GLUCAP 70 96 81 104* 84    Iron Studies: No results found for this basename:  IRON:30,TIBC:30,TRANSFERRIN:30,FERRITIN:30 in the last 168 hours  Physical Exam:  Blood pressure 124/65, pulse 90, temperature 97.9 F (36.6 C), temperature source Oral, resp. rate 18, weight 71.8 kg (158 lb 4.6 oz), SpO2 93.00%.  Gen:WD WN AAF in NAD  CVS:no rub  Resp:CTA  WUX:LKGMWN  Ext:s/p R BKA Neuro: mild chronic LUE weakness  Dialysis Orders: Center: [facility] on MWF Optiflux 180  EDW 78.5 HD Bath 2K/2.0Ca bath Time3.5 Heparin 7000. Access right upper AVGG BFR 500 DFR 800 Zemplar 18 mcg IV/HD Epogen 1000 Units IV/HD Venofer none Other profile 4; ferritins consistently in the 2000s and tsats in the 20s   Assessment/ Plan:   1. S/p right BKA - rehab in process. On coumadin for DVT prophylaxis (per ortho, 1mg  coumadin/day for 1 month) 2. ESRD - MWF hd 3. Anemia - Hb better, 9-10 range, cont aranesp 25/wk 4. Secondary hyperparathyroidism - hypercalcemia is improving; on decreased zemplar down to 8 from 18 ; on 2 Ca bath; to check labs pre HD; continue sensipar and binders; iPTH 500s.  5. HTN/volume - previously on norvasc 5 at HS; none at present; lower EDW after amputation, new dry weight around 70.5kg.  BP lowish, minimal UF with HD tomorrow. 6. Nutrition - albumin low on high protein renal diet; added prostat bid  7. DM - per primary  8. Hx CVA with LUE weakness 9. Dispo- tentative d/c date of 11/13 per CIR, will need HHC to be set up prior to d/c   Faith Moselle  MD [facility] [phone] pgr    [phone] cell 12/27/2011, 11:12 AM

## 2011-12-27 NOTE — Progress Notes (Signed)
ANTICOAGULATION CONSULT NOTE - Follow Up Consult  Pharmacy Consult:  Coumadin Indication: VTE prophylaxis  Allergies  Allergen Reactions  . Metoclopramide Hcl Other (See Comments)    Tardive dyskinesia  . Codeine Nausea And Vomiting  . Lipitor (Atorvastatin Calcium) Other (See Comments)    Muscle weakness    Patient Measurements: Weight: 158 lb 4.6 oz (71.8 kg)  Vital Signs: Temp: 97.9 F (36.6 C) (11/05 0500) Temp src: Oral (11/05 0500) BP: 124/65 mmHg (11/05 0500) Pulse Rate: 90  (11/05 0500)  Labs:  Basename 12/27/11 0722 12/26/11 1450 12/26/11 1439 12/26/11 0730 12/25/11 0650  HGB -- 9.2* -- 9.9* --  HCT -- 29.6* -- 31.9* --  PLT -- 435* -- 464* --  APTT -- -- -- -- --  LABPROT 18.6* -- -- 20.7* 20.1*  INR 1.61* -- -- 1.85* 1.78*  HEPARINUNFRC -- -- -- -- --  CREATININE -- -- 6.41* 6.24* --  CKTOTAL -- -- -- -- --  CKMB -- -- -- -- --  TROPONINI -- -- -- -- --    The CrCl is unknown because both a height and weight (above a minimum accepted value) are required for this calculation.  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  Coumadin was held on 10/23 through 10/25 due to a rapid increase in INR after Coumadin 5mg  x 2 on 10/21 and 10/22.   Patient did have elevated baseline INR.  Coumadin resumed 10/26 at a lower dose and INR was supratherapeutic again.  This may be influenced by poor PO intake and nausea/vomiting.  It has now declined to a subtherapeutic value after holding Warfarin 10/30 and giving 0.5mg  10/31-11/1.  Dose was increased to 1mg  on 11/2, and her INR is now declining after an initial response.  Goal of Therapy:  INR 2-3   Plan:  Try Coumadin 2mg  x 1 today Daily PT/INR  Estella Husk, Pharm.D., BCPS Clinical Pharmacist  Phone [phone] Pager [phone] 12/27/2011, 10:07 AM

## 2011-12-27 NOTE — Progress Notes (Signed)
Patient ID: Faith [name], female   DOB: [birth year], 56 y.o.   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 Lajoyce Corners. Post op started on coumadin for VTE prophylaxis. KI ordered to help keep knee in extension.  ESRD on HD  Subjective/Complaints: Knee immobilizer off in ams.  Pt denies removing it  Slept very well. No pain this am. Very alert. Sitting EOB.       Objective: Vital Signs: Blood pressure 124/65, pulse 90, temperature 97.9 F (36.6 C), temperature source Oral, resp. rate 18, weight 71.8 kg (158 lb 4.6 oz), SpO2 93.00%. No results found. Results for orders placed during the hospital encounter of 12/15/11 (from the past 72 hour(s))  GLUCOSE, CAPILLARY     Status: Abnormal   Collection Time   12/24/11 11:40 AM      Component Value Range Comment   Glucose-Capillary 191 (*) 70 - 99 mg/dL   GLUCOSE, CAPILLARY     Status: Normal   Collection Time   12/24/11  4:40 PM      Component Value Range Comment   Glucose-Capillary 70  70 - 99 mg/dL   GLUCOSE, CAPILLARY     Status: Abnormal   Collection Time   12/24/11  9:01 PM      Component Value Range Comment   Glucose-Capillary 127 (*) 70 - 99 mg/dL    Comment 1 Notify RN     PROTIME-INR     Status: Abnormal   Collection Time   12/25/11  6:50 AM      Component Value Range Comment   Prothrombin Time 20.1 (*) 11.6 - 15.2 seconds    INR 1.78 (*) 0.00 - 1.49   GLUCOSE, CAPILLARY     Status: Abnormal   Collection Time   12/25/11  7:52 AM      Component Value Range Comment   Glucose-Capillary 135 (*) 70 - 99 mg/dL   GLUCOSE, CAPILLARY     Status: Abnormal   Collection Time   12/25/11 11:25 AM      Component Value Range Comment   Glucose-Capillary 100 (*) 70 - 99 mg/dL   GLUCOSE, CAPILLARY     Status:  Abnormal   Collection Time   12/25/11  4:10 PM      Component Value Range Comment   Glucose-Capillary 120 (*) 70 - 99 mg/dL   GLUCOSE, CAPILLARY     Status: Normal   Collection Time   12/25/11  9:04 PM      Component Value Range Comment   Glucose-Capillary 73  70 - 99 mg/dL    Comment 1 Notify RN     PROTIME-INR     Status: Abnormal   Collection Time   12/26/11  7:30 AM      Component Value Range Comment   Prothrombin Time 20.7 (*) 11.6 - 15.2 seconds    INR 1.85 (*) 0.00 - 1.49   CBC     Status: Abnormal   Collection Time   12/26/11  7:30 AM      Component Value Range Comment   WBC 8.9  4.0 - 10.5 K/uL    RBC 4.02  3.87 - 5.11 MIL/uL    Hemoglobin 9.9 (*) 12.0 - 15.0 g/dL    HCT 40.9 (*) 81.1 - 46.0 %    MCV  79.4  78.0 - 100.0 fL    MCH 24.6 (*) 26.0 - 34.0 pg    MCHC 31.0  30.0 - 36.0 g/dL    RDW 16.1 (*) 09.6 - 15.5 %    Platelets 464 (*) 150 - 400 K/uL   RENAL FUNCTION PANEL     Status: Abnormal   Collection Time   12/26/11  7:30 AM      Component Value Range Comment   Sodium 130 (*) 135 - 145 mEq/L    Potassium 4.7  3.5 - 5.1 mEq/L    Chloride 90 (*) 96 - 112 mEq/L    CO2 28  19 - 32 mEq/L    Glucose, Bld 79  70 - 99 mg/dL    BUN 31 (*) 6 - 23 mg/dL    Creatinine, Ser 0.45 (*) 0.50 - 1.10 mg/dL    Calcium 40.9 (*) 8.4 - 10.5 mg/dL    Phosphorus 3.9  2.3 - 4.6 mg/dL    Albumin 2.8 (*) 3.5 - 5.2 g/dL    GFR calc non Af Amer 7 (*) >90 mL/min    GFR calc Af Amer 8 (*) >90 mL/min   GLUCOSE, CAPILLARY     Status: Normal   Collection Time   12/26/11  7:33 AM      Component Value Range Comment   Glucose-Capillary 84  70 - 99 mg/dL   GLUCOSE, CAPILLARY     Status: Abnormal   Collection Time   12/26/11 11:42 AM      Component Value Range Comment   Glucose-Capillary 104 (*) 70 - 99 mg/dL    Comment 1 Notify RN     RENAL FUNCTION PANEL     Status: Abnormal   Collection Time   12/26/11  2:39 PM      Component Value Range Comment   Sodium 127 (*) 135 - 145 mEq/L     Potassium 4.5  3.5 - 5.1 mEq/L    Chloride 87 (*) 96 - 112 mEq/L    CO2 27  19 - 32 mEq/L    Glucose, Bld 130 (*) 70 - 99 mg/dL    BUN 36 (*) 6 - 23 mg/dL    Creatinine, Ser 8.11 (*) 0.50 - 1.10 mg/dL    Calcium 91.4 (*) 8.4 - 10.5 mg/dL    Phosphorus 3.8  2.3 - 4.6 mg/dL    Albumin 2.7 (*) 3.5 - 5.2 g/dL    GFR calc non Af Amer 6 (*) >90 mL/min    GFR calc Af Amer 8 (*) >90 mL/min   CBC     Status: Abnormal   Collection Time   12/26/11  2:50 PM      Component Value Range Comment   WBC 8.9  4.0 - 10.5 K/uL    RBC 3.77 (*) 3.87 - 5.11 MIL/uL    Hemoglobin 9.2 (*) 12.0 - 15.0 g/dL    HCT 78.2 (*) 95.6 - 46.0 %    MCV 78.5  78.0 - 100.0 fL    MCH 24.4 (*) 26.0 - 34.0 pg    MCHC 31.1  30.0 - 36.0 g/dL    RDW 21.3 (*) 08.6 - 15.5 %    Platelets 435 (*) 150 - 400 K/uL   GLUCOSE, CAPILLARY     Status: Normal   Collection Time   12/26/11  7:22 PM      Component Value Range Comment   Glucose-Capillary 81  70 - 99 mg/dL   GLUCOSE, CAPILLARY  Status: Normal   Collection Time   12/26/11  9:19 PM      Component Value Range Comment   Glucose-Capillary 96  70 - 99 mg/dL   GLUCOSE, CAPILLARY     Status: Normal   Collection Time   12/27/11  7:32 AM      Component Value Range Comment   Glucose-Capillary 70  70 - 99 mg/dL    Comment 1 Notify RN        HEENT: normal Cardio: RRR Resp: Rales GI: BS positive and Distention Extremity:  No Edema Skin:   Other R BKA with dressing and immobilizer- no substantial drainage Neuro: Alert/Oriented, Abnormal Sensory absent proprioception, absent lt touch L foot, Abnormal Motor 2-/5 R ankle DF/PF, Dysarthric and Other tardive dyskinesia lip smacking Musc/Skel:  Other R BKA,  Gen: NAD-very alert    Assessment/Plan: 1. Functional deficits secondary to R BKA, DM with neuropathy which require 3+ hours per day of interdisciplinary therapy in a comprehensive inpatient rehab setting. Physiatrist is providing close team supervision and 24 hour  management of active medical problems listed below. Physiatrist and rehab team continue to assess barriers to discharge/monitor patient progress toward functional and medical goals. FIM: FIM - Bathing Bathing Steps Patient Completed: Chest;Right Arm;Left Arm;Abdomen;Front perineal area;Right upper leg;Left upper leg;Buttocks;Left lower leg (including foot) Bathing: 4: Min-Patient completes 8-9 55f 10 parts or 75+ percent  FIM - Upper Body Dressing/Undressing Upper body dressing/undressing steps patient completed: Thread/unthread right sleeve of pullover shirt/dresss;Thread/unthread left sleeve of pullover shirt/dress;Put head through opening of pull over shirt/dress;Pull shirt over trunk Upper body dressing/undressing: 5: Supervision: Safety issues/verbal cues FIM - Lower Body Dressing/Undressing Lower body dressing/undressing steps patient completed: Thread/unthread right pants leg;Thread/unthread left pants leg;Pull pants up/down;Don/Doff left sock;Don/Doff left shoe Lower body dressing/undressing: 1: Total-Patient completed less than 25% of tasks  FIM - Toileting Toileting steps completed by patient: Performs perineal hygiene Toileting Assistive Devices: Grab bar or rail for support Toileting: 0: No continent bowel/bladder events this shift  FIM - Diplomatic Services operational officer Devices: Grab bars Toilet Transfers: 3-To toilet/BSC: Mod A (lift or lower assist);3-From toilet/BSC: Mod A (lift or lower assist)  FIM - Bed/Chair Transfer Bed/Chair Transfer Assistive Devices: Bed rails;Arm rests Bed/Chair Transfer: 3: Supine > Sit: Mod A (lifting assist/Pt. 50-74%/lift 2 legs;3: Sit > Supine: Mod A (lifting assist/Pt. 50-74%/lift 2 legs);3: Bed > Chair or W/C: Mod A (lift or lower assist);3: Chair or W/C > Bed: Mod A (lift or lower assist)  FIM - Locomotion: Wheelchair Distance: 150 Locomotion: Wheelchair: 2: Travels 50 - 149 ft with supervision, cueing or coaxing FIM -  Locomotion: Ambulation Locomotion: Ambulation: 0: Activity did not occur  Comprehension Comprehension Mode: Auditory Comprehension: 5-Understands complex 90% of the time/Cues < 10% of the time  Expression Expression Mode: Verbal Expression: 5-Expresses basic needs/ideas: With no assist  Social Interaction Social Interaction Mode: Asleep Social Interaction: 5-Interacts appropriately 90% of the time - Needs monitoring or encouragement for participation or interaction.  Problem Solving Problem Solving Mode: Asleep Problem Solving: 5-Solves basic problems: With no assist  Memory Memory Mode: Asleep Memory: 6-More than reasonable amt of time  Medical Problem List and Plan:  1. DVT Prophylaxis/Anticoagulation: Pharmaceutical: Coumadin  2. Pain Management: monitor on prn Medication for now.Reduced gabapentin to 300mg --much more alert  3. Mood: difficult to judge. Will monitor for now and have LCSW follow for evaluation.  4. Neuropsych: This patient is capable of making decisions on his/her own behalf.  5. 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 SSI for elevated BS. --reasonable control at present 6. ESRD: HD M, W, F. Daily weights. Renal diet.  7. Low grade fevers: resolved monitor 8.  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  Multiple small meals 9.  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

## 2011-12-27 NOTE — Progress Notes (Signed)
Hypoglycemic Event  CBG: 65 at 2059 Treatment: 15 GM carbohydrate snack  Symptoms: None  Follow-up CBG: Time:  2154 CBG Result:76   Possible Reasons for Event: Vomiting and Inadequate meal intake  Comments/MD notified: Harvel Ricks notified at 2309 pm Patient vomited  after taking 15 mg of crackers. Another crackers have been given with sprite ( ESRD patients)   Faith [name] sedo  Remember to initiate Hypoglycemia Order Set & complete

## 2011-12-27 NOTE — Progress Notes (Signed)
Physical Therapy Session Note  Patient Details  Name: Faith [name] MRN: [id number] Date of Birth: 1955/08/17  Today's Date: 12/27/2011 Time: 1035-1120 Time Calculation (min): 45 min  Short Term Goals: Week 2:  PT Short Term Goal 1 (Week 2): Pt will perform all bed mobility with supervision. PT Short Term Goal 2 (Week 2): Pt will perform simulated car transfer with mod assist. PT Short Term Goal 3 (Week 2): Pt will perform SB transfer w/c> 22" surface with close superviison.   PT Short Term Goal 4 (Week 2): Pt will sit> stand with RW with assist of 1 person. PT Short Term Goal 5 (Week 2): Pt will stand with RW x 2 minutes during functional activity.  Skilled Therapeutic Interventions/Progress Updates:   Pt reclined in recliner, c/o pain L buttock, 4/10.  Squat pivot recliner> w.c to L with total assist, without use of SB.  W/c propulsion using bil UEs x 100' with supervision.  W/c> mat squat pivot transfer with max assist, without SB.  Pt with minimal lift of hips.  Examined pt's skin over buttocks due to c/o pain.  Pt with intact skin over L buttock, but tender over IT.  Therapeutic exercise performed with LE to increase strength for functional mobility: in R sidelying, L hip flexion/extension, ankle PF/DF and knee flexion extension on powder board,  L hip abduction active assistive   Issued pt hospital w/c 18 x 16 with pressure relieving cushion.  Discussed with RN need for pt to have pressure relief when sitting up in w/c or in recliner.  Phone call with husband re: w/c eval set for tomorrow.  He would like to attend, but cannot on Wed.  Left message for Newman Nickels, ATP to see if appt. Can be changed to Thursday.  Message also left for [place]ully, Harlan Stains.     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)  Required Braces or Orthoses: Knee Immobilizer - Right Knee Immobilizer - Right:  On except when in CPM Restrictions Weight Bearing Restrictions: Yes RLE Weight Bearing: Non weight bearing   Pain: Pain Assessment Pain Assessment: 0-10 Pain Score:   9 Pain Type: Surgical pain Pain Location: Leg Pain Orientation: Right (phantom limb pain) Pain Intervention(s): Medication (See eMAR)       See FIM for current functional status  Therapy/Group: Individual Therapy  Davi Rotan 12/27/2011, 1:01 PM

## 2011-12-27 NOTE — Progress Notes (Signed)
Occupational Therapy Session Note  Patient Details  Name: Faith [name] MRN: [id number] Date of Birth: 1955/11/22  Today's Date: 12/27/2011 Time: 0830-0930 Time Calculation (min): 60 min  Short Term Goals: Week 2:  OT Short Term Goal 1 (Week 2): STG=LTG  Skilled Therapeutic Interventions/Progress Updates:    ADL re-training completed at bed level this AM. Session with focus on bed <>w/c transfer (with slideboard), bathing, dressing, sitting balance, and bed mobility.  UB dressing performed EOB and performed LB dressing supine in bed. Patient still requires vc's during slideboard transfers for safety, and increase assistance during LB dressing due to fatigue. Pt did pull up underwear with vc's for the first time today. Will cont. To work on patient's increased participation in LB dressing at next session. Pt requested to transfer into recliner at end of session.  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)  Required Braces or Orthoses: Knee Immobilizer - Right Knee Immobilizer - Right: On except when in CPM Restrictions Weight Bearing Restrictions: Yes RLE Weight Bearing: Non weight bearing Pain: Pain Assessment Pain Assessment: No/denies pain  See FIM for current functional status  Therapy/Group: Individual Therapy  12/27/2011, 9:25 AM

## 2011-12-27 NOTE — Progress Notes (Signed)
Speech Language Pathology Daily Session Note  Patient Details  Name: Faith [name] MRN: [id number] Date of Birth: 03/07/1955  Today's Date: 12/27/2011 Time: 4098-1191 Time Calculation (min): 55 min  Short Term Goals: Week 1: SLP Short Term Goal 1 (Week 1): Patient will utilize external aids to facilitate increased recall of new information with Min A visual and verbal cuing.   SLP Short Term Goal 1 - Progress (Week 1): Progressing toward goal SLP Short Term Goal 2 (Week 1): Patient will demonstrate improved emergent awareness by asking for help when needed with Min A verbal cuing. SLP Short Term Goal 2 - Progress (Week 1): Progressing toward goal SLP Short Term Goal 3 (Week 1): Patient will solve basic problems during functional tasks with Min A visual and verbal cuing.   SLP Short Term Goal 3 - Progress (Week 1): Progressing toward goal  Skilled Therapeutic Interventions: Treatment session focused on addressing cognitive goals.  SLP facilitated session with choice of 3 to assist with meal choices for lunch.  Overall paitent required mod-max assist to make food choices that align with carb modified and renal restrictions.  SLP also facilitated session with set up of luch tray.     FIM:  Comprehension Comprehension: 5-Follows basic conversation/direction: With extra time/assistive device Expression Expression: 5-Expresses basic needs/ideas: With extra time/assistive device Social Interaction Social Interaction: 5-Interacts appropriately 90% of the time - Needs monitoring or encouragement for participation or interaction. Problem Solving Problem Solving: 5-Solves basic 90% of the time/requires cueing < 10% of the time Memory Memory: 3-Recognizes or recalls 50 - 74% of the time/requires cueing 25 - 49% of the time  Pain Pain Assessment Pain Assessment: 0-10 Pain Score:   7 Pain Type: Phantom pain Pain Location: Leg Pain Orientation: Right Pain Intervention(s): Medication (See  eMAR)  Therapy/Group: Individual Therapy  Charrisse Masley 12/27/2011, 2:37 PM

## 2011-12-27 NOTE — Progress Notes (Signed)
Speech Language Pathology Daily Session Note  Patient Details  Name: Faith [name] MRN: [id number] Date of Birth: 1956-02-06  Today's Date: 12/27/2011 Time: 1130-1200 Time Calculation (min): 30 min  Short Term Goals: Week 1: SLP Short Term Goal 1 (Week 1): Patient will utilize external aids to facilitate increased recall of new information with Min A visual and verbal cuing.   SLP Short Term Goal 1 - Progress (Week 1): Progressing toward goal SLP Short Term Goal 2 (Week 1): Patient will demonstrate improved emergent awareness by asking for help when needed with Min A verbal cuing. SLP Short Term Goal 2 - Progress (Week 1): Progressing toward goal SLP Short Term Goal 3 (Week 1): Patient will solve basic problems during functional tasks with Min A visual and verbal cuing.   SLP Short Term Goal 3 - Progress (Week 1): Progressing toward goal  Skilled Therapeutic Interventions: Treatment session focused on addressing cognitive goals.  SLP facilitated session with choice of 3 to assist with meal choices for lunch.  Overall paitent required mod-max assist to make food choices that align with carb modified and renal restrictions.  SLP also facilitated session with set up of luch tray.     FIM:     Pain Pain Assessment Pain Assessment: No/denies pain  Therapy/Group: Individual Therapy  Charlane Ferretti., CCC-SLP [phone]  Bassel Gaskill 12/27/2011, 1:12 PM

## 2011-12-28 ENCOUNTER — Inpatient Hospital Stay (HOSPITAL_COMMUNITY): Payer: Medicare Other | Admitting: *Deleted

## 2011-12-28 ENCOUNTER — Inpatient Hospital Stay (HOSPITAL_COMMUNITY): Payer: Medicare (Managed Care) | Admitting: Speech Pathology

## 2011-12-28 ENCOUNTER — Inpatient Hospital Stay (HOSPITAL_COMMUNITY): Payer: Medicare (Managed Care) | Admitting: Physical Therapy

## 2011-12-28 ENCOUNTER — Inpatient Hospital Stay (HOSPITAL_COMMUNITY): Payer: Medicare (Managed Care)

## 2011-12-28 LAB — PROTIME-INR
INR: 1.58 — ABNORMAL HIGH (ref 0.00–1.49)
Prothrombin Time: 18.4 seconds — ABNORMAL HIGH (ref 11.6–15.2)

## 2011-12-28 LAB — RENAL FUNCTION PANEL
Calcium: 11.2 mg/dL — ABNORMAL HIGH (ref 8.4–10.5)
GFR calc Af Amer: 9 mL/min — ABNORMAL LOW (ref >90)
Glucose, Bld: 90 mg/dL (ref 70–99)
Phosphorus: 3.4 mg/dL (ref 2.3–4.6)
Sodium: 130 mEq/L — ABNORMAL LOW (ref 135–145)

## 2011-12-28 LAB — GLUCOSE, CAPILLARY: Glucose-Capillary: 144 mg/dL — ABNORMAL HIGH (ref 70–99)

## 2011-12-28 LAB — CBC
HCT: 29.7 % — ABNORMAL LOW (ref 36.0–46.0)
MCH: 24.9 pg — ABNORMAL LOW (ref 26.0–34.0)
MCHC: 31 g/dL (ref 30.0–36.0)
RDW: 17 % — ABNORMAL HIGH (ref 11.5–15.5)

## 2011-12-28 MED ORDER — PARICALCITOL 5 MCG/ML IV SOLN
INTRAVENOUS | Status: AC
  Filled 2011-12-28: qty 1

## 2011-12-28 MED ORDER — HYDROMORPHONE HCL PF 1 MG/ML IJ SOLN
1.0000 mg | Freq: Once | INTRAMUSCULAR | Status: AC
  Administered 2011-12-28: 1 mg via INTRAVENOUS

## 2011-12-28 MED ORDER — HYDROMORPHONE HCL PF 1 MG/ML IJ SOLN
INTRAMUSCULAR | Status: AC
  Administered 2011-12-28: 1 mg via INTRAVENOUS
  Filled 2011-12-28: qty 1

## 2011-12-28 MED ORDER — WARFARIN SODIUM 2 MG PO TABS
2.0000 mg | ORAL_TABLET | Freq: Once | ORAL | Status: AC
  Administered 2011-12-28: 2 mg via ORAL
  Filled 2011-12-28: qty 1

## 2011-12-28 MED ORDER — DARBEPOETIN ALFA-POLYSORBATE 25 MCG/0.42ML IJ SOLN
INTRAMUSCULAR | Status: AC
  Administered 2011-12-28: 25 ug via INTRAVENOUS
  Filled 2011-12-28: qty 0.42

## 2011-12-28 MED ORDER — ACETAMINOPHEN 325 MG PO TABS
ORAL_TABLET | ORAL | Status: AC
  Administered 2011-12-28: 650 mg via ORAL
  Filled 2011-12-28: qty 2

## 2011-12-28 MED ORDER — BOOST / RESOURCE BREEZE PO LIQD
1.0000 | ORAL | Status: DC
  Administered 2011-12-28 – 2012-01-03 (×5): 1 via ORAL

## 2011-12-28 NOTE — Progress Notes (Signed)
The skilled treatment note has been reviewed and SLP is in agreement. Nida Manfredi, M.A., CCC-SLP [phone]  

## 2011-12-28 NOTE — Patient Care Conference (Signed)
Inpatient RehabilitationTeam Conference Note Date: 12/28/2011   Time: 10:45 AM    Patient Name: Faith [name]      Medical Record Number: [id number]  Date of Birth: 1955/10/30 Sex: Female         Room/Bed: 4036/4036-01 Payor Info: Payor: MEDICARE  Plan: MEDICARE PART A AND B  Product Type: *No Product type*     Admitting Diagnosis: RT BKA  Admit Date/Time:  12/15/2011  3:36 PM Admission Comments: No comment available   Primary Diagnosis:  Unilateral complete BKA Principal Problem: Unilateral complete BKA  Patient Active Problem List   Diagnosis Date Noted  . Unilateral complete BKA-right 12/18/2011  . Osteomyelitis of toe of right foot 11/25/2011    Class: Diagnosis of  . End stage renal disease 11/10/2011  . ESRD (end stage renal disease) on dialysis 01/17/2011  . Diabetes mellitus, type 2 01/17/2011  . Secondary hyperparathyroidism 01/17/2011  . CAD (coronary artery disease) of artery bypass graft 01/17/2011  . PAD (peripheral artery disease) 01/17/2011  . Chronic constipation 01/17/2011  . GERD (gastroesophageal reflux disease) 01/17/2011  . Anemia of chronic renal failure 01/17/2011  . Tardive dyskinesia 01/17/2011  . CVA (cerebral infarction) 01/17/2011  . Depression 01/17/2011  . Neuropathy 01/17/2011  . Diabetic retinopathy associated with type 2 diabetes mellitus 01/17/2011  . Diastolic CHF, chronic 01/17/2011  . History of Osteomyelitis of toe of left foot 01/17/2011    Expected Discharge Date: Expected Discharge Date: 01/04/12  Team Members Present: Physician: Dr. Claudette Laws Social Worker Present: Dossie Der, LCSW Nurse Present: Laural Roes, RN PT Present: Edman Circle, PT;[name] Adriana Simas, PT OT Present: Leonette Monarch, OT SLP Present: Fae Pippin, SLP Other (Discipline and Name): [facility]-PPS Coordinator & Carolynn Serve of RN     Current Status/Progress Goal Weekly Team Focus  Medical   Pain complaints, somnolence improving  Improved  pain control all while avoiding somnolence  Adjust medications   Bowel/Bladder   incontinent of bowel, use diaper LBM 12/27/11 dialysis pt  continent of bowel  continent of bowel   Swallow/Nutrition/ Hydration             ADL's   Patient is currently Supervison for grooming/UB dressing; Min Assist for bathing, Total Assist for LB dressing. Functional transfer: Supervision-Min A for slideboard transfer. Patient fatigues quickly during ADL and usually requires increased assistance at the end.   Mod I: grooming; Supervision: UB dressing; Min Assist: bathing/LB dressing/toileting/tub shower transfer  ADL performance, functional transfers, energy conservation, sitting balance    Mobility   activity tolerance improving; supervision SB transfers after set-up, total>max assist without SB; w/c mobiltiy x 60-100' with supervison  supervision level transfers; supervision w/c mobility x 150'  home accessiblity, custom w/c evaluation; strengthening, transfer training, endurance training   Communication             Safety/Cognition/ Behavioral Observations  mod assist  min assist  increased carryover of use of compensatory strategies to facilitate increased recall of new information and awareness   Pain   denied pain/ tramadol 50 mg, Robaxin 500 mg, vicodin 5-325 PRN  pain less or equal to 3  pain less or equal to 3   Skin   Skin tear mid sacrum allevyn dressing applied  free of new skin breakdown  free of skin breakdown      *See Interdisciplinary Assessment and Plan and progress notes for long and short-term goals  Barriers to Discharge: Husband needs to work    Possible Resolutions to Barriers:  FI alternate caregivers and maximize patient  independence    Discharge Planning/Teaching Needs:  Home with husband, PAce working on more hours of caregviers.  Awar eof 24 hr recommendations.  W/C eval tomorrow.      Team Discussion:  W/c eval 11/12, making progress cognitive deficits makes  carryover difficult from day to day.  Revisions to Treatment Plan:  None   Continued Need for Acute Rehabilitation Level of Care: The patient requires daily medical management by a physician with specialized training in physical medicine and rehabilitation for the following conditions: Daily direction of a multidisciplinary physical rehabilitation program to ensure safe treatment while eliciting the highest outcome that is of practical value to the patient.: Yes Daily medical management of patient stability for increased activity during participation in an intensive rehabilitation regime.: Yes Daily analysis of laboratory values and/or radiology reports with any subsequent need for medication adjustment of medical intervention for : Neurological problems;Other  Rhema Boyett, Lemar Livings 12/30/2011, 9:00 AM

## 2011-12-28 NOTE — Progress Notes (Signed)
Inpatient Diabetes Program Recommendations  AACE/ADA: New Consensus Statement on Inpatient Glycemic Control (2013)  Target Ranges:  Prepandial:   less than 140 mg/dL      Peak postprandial:   less than 180 mg/dL (1-2 hours)      Critically ill patients:  140 - 180 mg/dL   Reason for Visit: Mild Hypoglycemia  Inpatient Diabetes Program Recommendations Insulin - Basal: At this point during hospitalization, recommend not using any Lantus for this time.  Fasting glucose this am at 85 mg/dl and dropped to 65mg /dL yesterday mid-day.  Note: Thank you, Lenor Coffin, RN, CNS, Diabetes Coordinator [phone])

## 2011-12-28 NOTE — Progress Notes (Signed)
Recreational Therapy Session Note  Patient Details  Name: Faith [name] MRN: [id number] Date of Birth: [birth year] Today's Date: 12/28/2011 Time:  4098-1191 Pain: c/o 9/10 RLE, premedicated Skilled Therapeutic Interventions/Progress Updates: Pt thrashing around in bed upon entry to the room and requesting focus on relaxation therapy/pain management techniques while lying in bed. Reviewed deep breathing techniques, visualization & aromatherapy to assist in pain management. Pt agreeable to participate. Applied 1 drop lavender essential oil to gauze pad and placed on table within the room while listening to relaxing music (cd of pt's choice) and led pt in visualization activity.  Pt reported pain 7/10 at end of session.  Therapy/Group: Individual Therapy  Ariadne Rissmiller 12/28/2011, 5:23 PM

## 2011-12-28 NOTE — Progress Notes (Signed)
Speech Language Pathology Daily Session Note  Patient Details  Name: Faith [name] MRN: [id number] Date of Birth: 11/07/1955  Today's Date: 12/28/2011 Time: 4098-1191 Time Calculation (min): 55 min  Short Term Goals: Week 1: SLP Short Term Goal 1 (Week 1): Patient will utilize external aids to facilitate increased recall of new information with Min A visual and verbal cuing.   SLP Short Term Goal 1 - Progress (Week 1): Progressing toward goal SLP Short Term Goal 2 (Week 1): Patient will demonstrate improved emergent awareness by asking for help when needed with Min A verbal cuing. SLP Short Term Goal 2 - Progress (Week 1): Progressing toward goal SLP Short Term Goal 3 (Week 1): Patient will solve basic problems during functional tasks with Min A visual and verbal cuing.   SLP Short Term Goal 3 - Progress (Week 1): Progressing toward goal  Skilled Therapeutic Interventions: Treatment addressed recall and problem solving.  SLP facilitated the session with a moderately complex card game to incorporate compensatory strategies for improved recall.  Patient utilized word associations as a recall strategy with min assist question cues from the clinician.   Patient also solved basic problems with min assist verbal and visual cues during a functional task.      FIM:  Comprehension Comprehension Mode: Auditory Comprehension: 5-Follows basic conversation/direction: With extra time/assistive device Expression Expression Mode: Verbal Expression: 5-Expresses basic needs/ideas: With extra time/assistive device Social Interaction Social Interaction Mode: Asleep Social Interaction: 6-Interacts appropriately with others with medication or extra time (anti-anxiety, antidepressant). Problem Solving Problem Solving: 4-Solves basic 75 - 89% of the time/requires cueing 10 - 24% of the time Memory Memory: 3-Recognizes or recalls 50 - 74% of the time/requires cueing 25 - 49% of the time  Pain Pain  Assessment Pain Assessment: No/denies pain   Therapy/Group: Individual Therapy  Jackalyn Lombard, Conrad [place]  Graduate Clinician Speech Language Pathology   Enzio Buchler, Joni Reining 12/28/2011, 2:53 PM

## 2011-12-28 NOTE — Progress Notes (Signed)
I have read and agree with the following treatment session.  Elchonon Maxson Hall, PT, DPT 

## 2011-12-28 NOTE — Progress Notes (Addendum)
Occupational Therapy Session Note  Patient Details  Name: Faith [name] MRN: [id number] Date of Birth: 23-May-1955  Today's Date: 12/28/2011 Time: 0830-0930 Time Calculation (min): 60 min  Short Term Goals: Week 2:  OT Short Term Goal 1 (Week 2): STG=LTG  Skilled Therapeutic Interventions/Progress Updates:    ADL re-training completed at bed level this AM. Session with focus on bed <>w/c transfer (with slideboard), bathing, dressing, sitting balance, and bed mobility.  UB dressing performed EOB and performed LB dressing supine in bed. Pt requires vc's for task initiation at times. Continuing to work on [employer]. Pt still becomes fatigued towards end of session which requires increased assist from therapist. Pt requested to transfer into recliner at end of session.  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)  Required Braces or Orthoses: Knee Immobilizer - Right Knee Immobilizer - Right: On except when in CPM Restrictions Weight Bearing Restrictions: Yes RLE Weight Bearing: Non weight bearing Pain: Pain Assessment Pain Assessment: 0-10 Pain Score:   6 Pain Type: Surgical pain Pain Location: Leg Pain Orientation: Right Pain Descriptors: Sore Pain Frequency: Intermittent Pain Onset: Gradual Patients Stated Pain Goal: 2 Pain Intervention(s): Medication (See eMAR) Multiple Pain Sites: No  See FIM for current functional status  Therapy/Group: Individual Therapy   Limmie Patricia, OTR/L  12/28/2011, 11:16 AM Addendum: Added signature. LE

## 2011-12-28 NOTE — Progress Notes (Signed)
Physical Therapy Session Note  Patient Details  Name: Faith [name] MRN: [id number] Date of Birth: 13-Apr-1955  Today's Date: 12/28/2011 Time: 8469-6295 Time Calculation (min): 30 min  Short Term Goals: Week 2:  PT Short Term Goal 1 (Week 2): Pt will perform all bed mobility with supervision. PT Short Term Goal 2 (Week 2): Pt will perform simulated car transfer with mod assist. PT Short Term Goal 3 (Week 2): Pt will perform SB transfer w/c> 22" surface with close superviison.   PT Short Term Goal 4 (Week 2): Pt will sit> stand with RW with assist of 1 person. PT Short Term Goal 5 (Week 2): Pt will stand with RW x 2 minutes during functional activity.     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)  Required Braces or Orthoses: Knee Immobilizer - Right Knee Immobilizer - Right: On except when in CPM Restrictions Weight Bearing Restrictions: Yes RLE Weight Bearing: Non weight bearing   Vital Signs: Therapy Vitals Temp: 98.6 F (37 C) Temp src: Oral Pulse Rate: 71  Resp: 20  BP: 127/70 mmHg Patient Position, if appropriate: Lying Oxygen Therapy SpO2: 100 % O2 Device: None (Room air) Pain: Pain Assessment Pain Assessment: Faces Pain Score:   8 Faces Pain Scale: Hurts little more Pain Type: Surgical pain Pain Location: Leg Pain Orientation: Right;Distal Pain Descriptors: Throbbing Pain Frequency: Intermittent Pain Onset: Gradual Patients Stated Pain Goal: 2 Pain Intervention(s): RN made aware;Medication (See eMAR) Multiple Pain Sites: No   Other Treatments:   Patient lying in bed upon arrival of PT. Patient c/o of pain (faces scale: 4/10) in RLE secondary to amputation. RN made aware and patient received pain medication during therapy session. Patient performed bed mobility in form of rolling to the L with supervision; to the R with min A. Patient performed supine > sit with min A  at trunk. Patient performed L LAQ x 10 reps in sitting (limited ROM secondary to decreased hamstring flexibility), requiring min vcs for form. Patient performed L AP x 10 reps. Ankle ROM significantly limited secondary to gastroc tightness.  Patient performed sit > supine with mod A for lifting both legs. Patient performed R hip ext x 10 reps in sidelying with mod A to keep R hip in proper positioning.  Patient performed the following exercises in supine: B hip abduction x 10 reps; L hip abd/ER x 10 reps, L heel slides with resisted hip and knee extension x 9 reps - unable to perform 10th rep secondary to fatigue and "feeling SOB." Vitals measured - see above.  Patient was left lying in bed, with call bell in reach. Patient had no c/o pain at end of session.   See FIM for current functional status  Therapy/Group: Individual Therapy  Abijah Roussel Hamilton DPT Student  12/28/2011, 4:01 PM

## 2011-12-28 NOTE — Progress Notes (Signed)
Physical Therapy Session Note  Patient Details  Name: Faith [name] MRN: [id number] Date of Birth: 12/08/1955  Today's Date: 12/28/2011 Time: 1335-1440 Time Calculation (min): 65 min  Short Term Goals: Week 2:  PT Short Term Goal 1 (Week 2): Pt will perform all bed mobility with supervision. PT Short Term Goal 2 (Week 2): Pt will perform simulated car transfer with mod assist. PT Short Term Goal 3 (Week 2): Pt will perform SB transfer w/c> 22" surface with close superviison.   PT Short Term Goal 4 (Week 2): Pt will sit> stand with RW with assist of 1 person. PT Short Term Goal 5 (Week 2): Pt will stand with RW x 2 minutes during functional activity.  Skilled Therapeutic Interventions/Progress Updates:   Explained to pt and sister process and plan for w/c and cushion eval next Tuesday, 10:30 with Newman Nickels.  Bed mobility training without rail, for supine> sit with supervision.  From high bed> stand with heavy chair in front, max assist of 1 person, x 20 seconds x 2.  Sister participated in bed transfer using SB, close supervision after set-up.  Pt able to verbally direct 2/5 steps for transfer.   She is confused about how to put SB under her, and how to flip back armrest, and is delayed in remembering LLE and Bil legrests.  W/c mobility x 50' x 2, veering R and requiring many self-corrections to stay on the R side of the hall.  bil LE strengthening in sitting: 10 x 1 bil hip adduction squeezing football, and abduction against resistance of light green Theraband.  With LLE on Kinetron foot box, and RLe supported on stool, pt extended LLE with minimal excursion x 1 minute, x 2.    Pt exhausted; w/c> bed uphill SB transfer with set-up and min assist to initiate movement of hips.  Discussed L IT pain with pt.  She stated it felt better today vs yesterday.  Advised pt to rest in bed rather than sit up in recliner to relieve pressure on L IT.  Source of pain unknown; no changes in skin  over IT, but area remains tender.    Husband here for family ed tomorrow.    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)  Required Braces or Orthoses: Knee Immobilizer - Right Knee Immobilizer - Right: On except when in CPM Restrictions Weight Bearing Restrictions: Yes RLE Weight Bearing: Non weight bearing   Pain: Pain Assessment Pain Assessment: No/denies pain Pain Score:   4 Pain Type: Surgical pain Pain Location: Leg Pain Orientation: Right Pain Descriptors: Sore Pain Frequency: Intermittent Pain Onset: Gradual Patients Stated Pain Goal: 2 Pain Intervention(s): Medication (See eMAR) Multiple Pain Sites: No    Other Treatments: family ed with daughter Dois Davenport, for bed> w/c (downhill) transfer    See FIM for current functional status  Therapy/Group: Individual Therapy  Lizann Edelman 12/28/2011, 3:13 PM

## 2011-12-28 NOTE — Progress Notes (Signed)
Hemodialysis- See Flowsheet Pt c/o severe pain in right leg. Did not have anything available. Last given Vicodin at 4pm and tylenol at 1720. Pt stating she cant take this and wants to sign off, Dr. Allena Katz paged about issue. Order to give 1mg  Dilaudid IV, done.

## 2011-12-28 NOTE — Progress Notes (Signed)
ANTICOAGULATION CONSULT NOTE - Follow Up Consult  Pharmacy Consult:  Coumadin Indication: VTE prophylaxis  Allergies  Allergen Reactions  . Metoclopramide Hcl Other (See Comments)    Tardive dyskinesia  . Codeine Nausea And Vomiting  . Lipitor (Atorvastatin Calcium) Other (See Comments)    Muscle weakness    Patient Measurements: Weight: 163 lb 9.3 oz (74.2 kg)  Vital Signs: Temp: 98.4 F (36.9 C) (11/06 0500) Temp src: Oral (11/06 0500) BP: 124/67 mmHg (11/06 0500) Pulse Rate: 127  (11/06 0500)  Labs:  Basename 12/28/11 0500 12/27/11 0722 12/26/11 1450 12/26/11 1439 12/26/11 0730  HGB -- -- 9.2* -- 9.9*  HCT -- -- 29.6* -- 31.9*  PLT -- -- 435* -- 464*  APTT -- -- -- -- --  LABPROT 18.4* 18.6* -- -- 20.7*  INR 1.58* 1.61* -- -- 1.85*  HEPARINUNFRC -- -- -- -- --  CREATININE -- -- -- 6.41* 6.24*  CKTOTAL -- -- -- -- --  CKMB -- -- -- -- --  TROPONINI -- -- -- -- --    The CrCl is unknown because both a height and weight (above a minimum accepted value) are required for this calculation.  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  Coumadin was held on 10/23 through 10/25 due to a rapid increase in INR after Coumadin 5mg  x 2 on 10/21 and 10/22.   Patient did have elevated baseline INR.  Coumadin resumed 10/26 at a lower dose and INR was supratherapeutic again.  This may be influenced by poor PO intake and nausea/vomiting.  It has now declined to a subtherapeutic value after holding Warfarin 10/30 and giving 0.5mg  10/31-11/1.  Dose was increased to 1mg  on 11/2, and her INR is still declining after an initial response.  Goal of Therapy:  INR 2-3   Plan:  Try Coumadin 2mg  x 1 today Daily PT/INR  Braelin Brosch, Pharm.D. Clinical Pharmacist  Phone [phone] Pager [phone] 12/28/2011, 12:42 PM

## 2011-12-28 NOTE — Progress Notes (Signed)
Nutrition Follow-up  Intervention:   1. Add Resource Breeze po daily, each supplement provides 250 kcal and 9 grams of protein.  2. Discontinue 30 ml Prostat - RN states pt does not like it.  3. Consider diet liberalization - per renal 4. RD to continue to follow nutrition care plan.  Assessment:   Noted pt required disimpaction on 11/3. Hypoglycemic event on 11/5. Intake of meals remains mostly <50%. RN states that pt is consuming Nepro Shake PO BID. Pt reports that she sometimes has GI distress with Nepro. Agreeable to adding Breeze daily.  She states that her breakfast is her best meal of the day. Current lunch tray is untouched.  Diet Order:  Renal 80 - 90; 1200 ml fluid restriction Supplement: Nepro Shake PO BID  Meds: Scheduled Meds:   . aspirin EC  81 mg Oral Daily  . cinacalcet  30 mg Oral Daily  . cinacalcet  90 mg Oral QPM  . darbepoetin (ARANESP) injection - DIALYSIS  25 mcg Intravenous Q Wed-HD  . ezetimibe  10 mg Oral Daily  . feeding supplement (NEPRO CARB STEADY)  237 mL Oral BID BM  . feeding supplement  30 mL Oral BID WC  . fenofibrate  54 mg Oral Daily  . gabapentin  300 mg Oral QHS  . insulin aspart  0-9 Units Subcutaneous TID WC  . insulin aspart  3 Units Subcutaneous TID WC  . insulin glargine  6 Units Subcutaneous QHS  . multivitamin  1 tablet Oral QHS  . Muscle Rub   Topical TID  . pantoprazole  40 mg Oral Q1200  . paricalcitol  8 mcg Intravenous Q M,W,F-HD  . senna-docusate  1 tablet Oral BID  . sevelamer  2,400 mg Oral TID WC  . [COMPLETED] warfarin  2 mg Oral ONCE-1800  . Warfarin - Pharmacist Dosing Inpatient   Does not apply q1800  . [DISCONTINUED] trolamine salicylate   Topical TID   Continuous Infusions:  PRN Meds:.acetaminophen, albuterol, calcium carbonate (dosed in mg elemental calcium), camphor-menthol, docusate sodium, feeding supplement (NEPRO CARB STEADY), Glycerin (Adult), guaiFENesin-dextromethorphan, heparin, heparin, heparin, heparin,  heparin, HYDROcodone-acetaminophen, hydrOXYzine, lidocaine, lidocaine-prilocaine, methocarbamol, naphazoline, nitroGLYCERIN, ondansetron (ZOFRAN) IV, ondansetron, pentafluoroprop-tetrafluoroeth, polyvinyl alcohol traZODone   CMP     Component Value Date/Time   NA 127* 12/26/2011 1439   K 4.5 12/26/2011 1439   CL 87* 12/26/2011 1439   CO2 27 12/26/2011 1439   GLUCOSE 130* 12/26/2011 1439   BUN 36* 12/26/2011 1439   CREATININE 6.41* 12/26/2011 1439   CALCIUM 10.7* 12/26/2011 1439   CALCIUM 9.8 01/18/2011 0540   PROT 8.0 12/12/2011 1414   ALBUMIN 2.7* 12/26/2011 1439   AST 30 12/12/2011 1414   ALT 18 12/12/2011 1414   ALKPHOS 111 12/12/2011 1414   BILITOT 1.8* 12/12/2011 1414   GFRNONAA 6* 12/26/2011 1439   GFRAA 8* 12/26/2011 1439   Phosphorus  Date/Time Value Range Status  12/26/2011  2:39 PM 3.8  2.3 - 4.6 mg/dL Final    CBG (last 3)   Basename 12/28/11 1131 12/28/11 0759 12/27/11 2245  GLUCAP 144* 85 101*     Intake/Output Summary (Last 24 hours) at 12/28/11 1202 Last data filed at 12/28/11 0900  Gross per 24 hour  Intake    360 ml  Output      0 ml  Net    360 ml  BM 11/5  Weight Status:  154 lb s/p HD on 11/4; stable 154 lb s/p HD on 10/30  Estimated  needs:  2000 - 2200 kcal, 86 - 100 grams protein  Nutrition Dx:  Inadequate oral intake r/t GI distress AEB pt report and decreasing weight.  Goal:  Pt to meet >/= 90% of their estimated nutrition needs  Monitor:  weight trends, lab trends, I/O's, PO intake, supplement tolerance  Jarold Motto MS, RD, LDN Pager: [phone] After-hours pager: [phone]

## 2011-12-28 NOTE — Progress Notes (Signed)
Patient ID: Faith [name], female   DOB: 1955-08-28, 56 y.o.   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 Lajoyce Corners. Post op started on coumadin for VTE prophylaxis. KI ordered to help keep knee in extension.  ESRD on HD  Subjective/Complaints: Knee immobilizer off in ams.  Pt denies removing it  Slept very well. No pain this am. Very alert. Sitting EOB.       Objective: Vital Signs: Blood pressure 124/67, pulse 127, temperature 98.4 F (36.9 C), temperature source Oral, resp. rate 20, weight 74.2 kg (163 lb 9.3 oz), SpO2 97.00%. No results found. Results for orders placed during the hospital encounter of 12/15/11 (from the past 72 hour(s))  GLUCOSE, CAPILLARY     Status: Abnormal   Collection Time   12/25/11 11:25 AM      Component Value Range Comment   Glucose-Capillary 100 (*) 70 - 99 mg/dL   GLUCOSE, CAPILLARY     Status: Abnormal   Collection Time   12/25/11  4:10 PM      Component Value Range Comment   Glucose-Capillary 120 (*) 70 - 99 mg/dL   GLUCOSE, CAPILLARY     Status: Normal   Collection Time   12/25/11  9:04 PM      Component Value Range Comment   Glucose-Capillary 73  70 - 99 mg/dL    Comment 1 Notify RN     PROTIME-INR     Status: Abnormal   Collection Time   12/26/11  7:30 AM      Component Value Range Comment   Prothrombin Time 20.7 (*) 11.6 - 15.2 seconds    INR 1.85 (*) 0.00 - 1.49   CBC     Status: Abnormal   Collection Time   12/26/11  7:30 AM      Component Value Range Comment   WBC 8.9  4.0 - 10.5 K/uL    RBC 4.02  3.87 - 5.11 MIL/uL    Hemoglobin 9.9 (*) 12.0 - 15.0 g/dL    HCT 11.9 (*) 14.7 - 46.0 %    MCV 79.4  78.0 - 100.0 fL    MCH 24.6 (*) 26.0 - 34.0 pg    MCHC 31.0  30.0 - 36.0 g/dL    RDW 82.9 (*) 56.2 - 15.5  %    Platelets 464 (*) 150 - 400 K/uL   RENAL FUNCTION PANEL     Status: Abnormal   Collection Time   12/26/11  7:30 AM      Component Value Range Comment   Sodium 130 (*) 135 - 145 mEq/L    Potassium 4.7  3.5 - 5.1 mEq/L    Chloride 90 (*) 96 - 112 mEq/L    CO2 28  19 - 32 mEq/L    Glucose, Bld 79  70 - 99 mg/dL    BUN 31 (*) 6 - 23 mg/dL    Creatinine, Ser 1.30 (*) 0.50 - 1.10 mg/dL    Calcium 86.5 (*) 8.4 - 10.5 mg/dL    Phosphorus 3.9  2.3 - 4.6 mg/dL    Albumin 2.8 (*) 3.5 - 5.2 g/dL    GFR calc non Af Amer 7 (*) >90 mL/min    GFR calc Af Amer 8 (*) >90 mL/min   GLUCOSE, CAPILLARY  Status: Normal   Collection Time   12/26/11  7:33 AM      Component Value Range Comment   Glucose-Capillary 84  70 - 99 mg/dL   GLUCOSE, CAPILLARY     Status: Abnormal   Collection Time   12/26/11 11:42 AM      Component Value Range Comment   Glucose-Capillary 104 (*) 70 - 99 mg/dL    Comment 1 Notify RN     RENAL FUNCTION PANEL     Status: Abnormal   Collection Time   12/26/11  2:39 PM      Component Value Range Comment   Sodium 127 (*) 135 - 145 mEq/L    Potassium 4.5  3.5 - 5.1 mEq/L    Chloride 87 (*) 96 - 112 mEq/L    CO2 27  19 - 32 mEq/L    Glucose, Bld 130 (*) 70 - 99 mg/dL    BUN 36 (*) 6 - 23 mg/dL    Creatinine, Ser 1.61 (*) 0.50 - 1.10 mg/dL    Calcium 09.6 (*) 8.4 - 10.5 mg/dL    Phosphorus 3.8  2.3 - 4.6 mg/dL    Albumin 2.7 (*) 3.5 - 5.2 g/dL    GFR calc non Af Amer 6 (*) >90 mL/min    GFR calc Af Amer 8 (*) >90 mL/min   CBC     Status: Abnormal   Collection Time   12/26/11  2:50 PM      Component Value Range Comment   WBC 8.9  4.0 - 10.5 K/uL    RBC 3.77 (*) 3.87 - 5.11 MIL/uL    Hemoglobin 9.2 (*) 12.0 - 15.0 g/dL    HCT 04.5 (*) 40.9 - 46.0 %    MCV 78.5  78.0 - 100.0 fL    MCH 24.4 (*) 26.0 - 34.0 pg    MCHC 31.1  30.0 - 36.0 g/dL    RDW 81.1 (*) 91.4 - 15.5 %    Platelets 435 (*) 150 - 400 K/uL   GLUCOSE, CAPILLARY     Status: Normal   Collection Time    12/26/11  7:22 PM      Component Value Range Comment   Glucose-Capillary 81  70 - 99 mg/dL   GLUCOSE, CAPILLARY     Status: Normal   Collection Time   12/26/11  9:19 PM      Component Value Range Comment   Glucose-Capillary 96  70 - 99 mg/dL   PROTIME-INR     Status: Abnormal   Collection Time   12/27/11  7:22 AM      Component Value Range Comment   Prothrombin Time 18.6 (*) 11.6 - 15.2 seconds    INR 1.61 (*) 0.00 - 1.49   GLUCOSE, CAPILLARY     Status: Normal   Collection Time   12/27/11  7:32 AM      Component Value Range Comment   Glucose-Capillary 70  70 - 99 mg/dL    Comment 1 Notify RN     GLUCOSE, CAPILLARY     Status: Abnormal   Collection Time   12/27/11 11:32 AM      Component Value Range Comment   Glucose-Capillary 114 (*) 70 - 99 mg/dL    Comment 1 Notify RN     GLUCOSE, CAPILLARY     Status: Abnormal   Collection Time   12/27/11  4:59 PM      Component Value Range Comment   Glucose-Capillary 132 (*) 70 -  99 mg/dL   GLUCOSE, CAPILLARY     Status: Abnormal   Collection Time   12/27/11  8:59 PM      Component Value Range Comment   Glucose-Capillary 65 (*) 70 - 99 mg/dL   GLUCOSE, CAPILLARY     Status: Normal   Collection Time   12/27/11  9:54 PM      Component Value Range Comment   Glucose-Capillary 76  70 - 99 mg/dL   GLUCOSE, CAPILLARY     Status: Abnormal   Collection Time   12/27/11 10:45 PM      Component Value Range Comment   Glucose-Capillary 101 (*) 70 - 99 mg/dL   PROTIME-INR     Status: Abnormal   Collection Time   12/28/11  5:00 AM      Component Value Range Comment   Prothrombin Time 18.4 (*) 11.6 - 15.2 seconds    INR 1.58 (*) 0.00 - 1.49   GLUCOSE, CAPILLARY     Status: Normal   Collection Time   12/28/11  7:59 AM      Component Value Range Comment   Glucose-Capillary 85  70 - 99 mg/dL    Comment 1 Notify RN        HEENT: normal Cardio: RRR Resp: Rales GI: BS positive and Distention Extremity:  No Edema Skin:   Other R BKA with dressing  and immobilizer- no substantial drainage Neuro: Alert/Oriented, Abnormal Sensory absent proprioception, absent lt touch L foot, Abnormal Motor 2-/5 R ankle DF/PF, Dysarthric and Other tardive dyskinesia lip smacking Musc/Skel:  Other R BKA,  Gen: NAD-very alert    Assessment/Plan: 1. Functional deficits secondary to R BKA, DM with neuropathy which require 3+ hours per day of interdisciplinary therapy in a comprehensive inpatient rehab setting. Physiatrist is providing close team supervision and 24 hour management of active medical problems listed below. Physiatrist and rehab team continue to assess barriers to discharge/monitor patient progress toward functional and medical goals. FIM: FIM - Bathing Bathing Steps Patient Completed: Chest;Right Arm;Left Arm;Abdomen;Front perineal area;Right upper leg;Left upper leg;Buttocks;Left lower leg (including foot) Bathing: 4: Min-Patient completes 8-9 35f 10 parts or 75+ percent  FIM - Upper Body Dressing/Undressing Upper body dressing/undressing steps patient completed: Thread/unthread right sleeve of pullover shirt/dresss;Thread/unthread left sleeve of pullover shirt/dress;Put head through opening of pull over shirt/dress;Pull shirt over trunk Upper body dressing/undressing: 5: Supervision: Safety issues/verbal cues FIM - Lower Body Dressing/Undressing Lower body dressing/undressing steps patient completed: Thread/unthread right underwear leg;Thread/unthread left underwear leg;Pull underwear up/down;Thread/unthread right pants leg;Thread/unthread left pants leg;Pull pants up/down;Don/Doff left sock;Don/Doff left shoe Lower body dressing/undressing: 1: Total-Patient completed less than 25% of tasks  FIM - Toileting Toileting steps completed by patient: Performs perineal hygiene Toileting Assistive Devices: Grab bar or rail for support Toileting: 0: Activity did not occur  FIM - Diplomatic Services operational officer Devices: Therapist, music  Transfers: 0-Activity did not occur  FIM - Banker Devices: Sliding board;Bed rails Bed/Chair Transfer: 2: Chair or W/C > Bed: Max A (lift and lower assist)  FIM - Locomotion: Wheelchair Distance: 150 Locomotion: Wheelchair: 2: Travels 50 - 149 ft with supervision, cueing or coaxing FIM - Locomotion: Ambulation Locomotion: Ambulation: 0: Activity did not occur  Comprehension Comprehension Mode: Auditory Comprehension: 5-Follows basic conversation/direction: With extra time/assistive device  Expression Expression Mode: Verbal Expression: 5-Expresses basic needs/ideas: With extra time/assistive device  Social Interaction Social Interaction Mode: Asleep Social Interaction: 5-Interacts appropriately 90% of the time - Needs monitoring  or encouragement for participation or interaction.  Problem Solving Problem Solving Mode: Asleep Problem Solving: 5-Solves basic 90% of the time/requires cueing < 10% of the time  Memory Memory Mode: Asleep Memory: 3-Recognizes or recalls 50 - 74% of the time/requires cueing 25 - 49% of the time  Medical Problem List and Plan:  1. DVT Prophylaxis/Anticoagulation: Pharmaceutical: Coumadin  2. Pain Management: monitor on prn Medication for now.Reduced gabapentin to 300mg --much more alert, if phantom pain worsens consider nortriptyline 3. Mood: difficult to judge. Will monitor for now and have LCSW follow for evaluation.  4. Neuropsych: This patient is capable of making decisions on his/her own behalf.  5. 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 SSI for elevated BS. --reasonable control at present, one low CBG yesterday 6. ESRD: HD M, W, F. Daily weights. Renal diet.  7. Low grade fevers: resolved monitor 8.  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  Multiple small meals 9.  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

## 2011-12-29 ENCOUNTER — Inpatient Hospital Stay (HOSPITAL_COMMUNITY): Payer: Medicare (Managed Care) | Admitting: Speech Pathology

## 2011-12-29 ENCOUNTER — Inpatient Hospital Stay (HOSPITAL_COMMUNITY): Payer: Medicare Other | Admitting: *Deleted

## 2011-12-29 ENCOUNTER — Inpatient Hospital Stay (HOSPITAL_COMMUNITY): Payer: Medicare (Managed Care)

## 2011-12-29 DIAGNOSIS — I70269 Atherosclerosis of native arteries of extremities with gangrene, unspecified extremity: Secondary | ICD-10-CM

## 2011-12-29 DIAGNOSIS — G8110 Spastic hemiplegia affecting unspecified side: Secondary | ICD-10-CM

## 2011-12-29 DIAGNOSIS — S88119A Complete traumatic amputation at level between knee and ankle, unspecified lower leg, initial encounter: Secondary | ICD-10-CM

## 2011-12-29 LAB — GLUCOSE, CAPILLARY
Glucose-Capillary: 36 mg/dL — CL (ref 70–99)
Glucose-Capillary: 85 mg/dL (ref 70–99)

## 2011-12-29 LAB — PROTIME-INR: INR: 1.5 — ABNORMAL HIGH (ref 0.00–1.49)

## 2011-12-29 MED ORDER — WARFARIN SODIUM 2 MG PO TABS
2.0000 mg | ORAL_TABLET | Freq: Once | ORAL | Status: AC
  Administered 2011-12-29: 2 mg via ORAL
  Filled 2011-12-29: qty 1

## 2011-12-29 MED ORDER — GLUCOSE 40 % PO GEL
ORAL | Status: AC
  Administered 2011-12-29: 37.5 g
  Filled 2011-12-29: qty 1

## 2011-12-29 MED ORDER — TRAMADOL HCL 50 MG PO TABS
50.0000 mg | ORAL_TABLET | Freq: Three times a day (TID) | ORAL | Status: DC
  Administered 2011-12-29 – 2012-01-04 (×18): 50 mg via ORAL
  Filled 2011-12-29 (×18): qty 1

## 2011-12-29 MED ORDER — HYDROCODONE-ACETAMINOPHEN 5-325 MG PO TABS
1.0000 | ORAL_TABLET | ORAL | Status: DC | PRN
  Administered 2011-12-29 – 2012-01-04 (×15): 1 via ORAL
  Filled 2011-12-29 (×15): qty 1

## 2011-12-29 MED ORDER — HEPARIN SODIUM (PORCINE) 1000 UNIT/ML DIALYSIS
20.0000 [IU]/kg | INTRAMUSCULAR | Status: DC | PRN
  Administered 2011-12-30: 1400 [IU] via INTRAVENOUS_CENTRAL
  Filled 2011-12-29: qty 2

## 2011-12-29 MED ORDER — GLUCOSE 40 % PO GEL: 1.0000 | ORAL | Status: DC | PRN

## 2011-12-29 NOTE — Progress Notes (Signed)
Subjective: No current complaints, comfortable, but occasionally short of breath and anxious.  Objective: Vital signs in last 24 hours: Temp:  [98.6 F (37 C)-99 F (37.2 C)] 98.7 F (37.1 C) (11/07 0508) Pulse Rate:  [71-83] 80  (11/07 0508) Resp:  [16-20] 18  (11/07 0508) BP: (86-127)/(33-84) 115/63 mmHg (11/07 0508) SpO2:  [91 %-100 %] 98 % (11/07 0508) Weight:  [68.1 kg (150 lb 2.1 oz)-72.9 kg (160 lb 11.5 oz)] 68.1 kg (150 lb 2.1 oz) (11/07 0508) Weight change: -1.3 kg (-2 lb 13.9 oz)  Intake/Output from previous day: 11/06 0701 - 11/07 0700 In: 600 [P.O.:600] Out: 2302  Intake/Output this shift: Total I/O In: 720 [P.O.:720] Out: -  EXAM: General appearance:  Alert, in no apparent distress Resp:   CTA without rales, rhonchi, or wheezes Cardio:  RRR without murmur or rub GI: + BS, soft and nontender Extremities:  No edema Access:  AVG @ RUA with + bruit  Lab Results:  Basename 12/28/11 1708 12/26/11 1450  WBC 8.0 8.9  HGB 9.2* 9.2*  HCT 29.7* 29.6*  PLT 431* 435*   BMET:  Basename 12/28/11 1708 12/26/11 1439  NA 130* 127*  K 4.4 4.5  CL 90* 87*  CO2 28 27  GLUCOSE 90 130*  BUN 35* 36*  CREATININE 5.37* 6.41*  CALCIUM 11.2* 10.7*  ALBUMIN 2.8* 2.7*   No results found for this basename: PTH:2 in the last 72 hours Iron Studies: No results found for this basename: IRON,TIBC,TRANSFERRIN,FERRITIN in the last 72 hours  Dialysis Orders: Center: [facility] on MWF Optiflux 180  EDW 78.5 HD Bath 2K/2.0Ca bath Time3.5 Heparin 7000. Access right upper AVGG BFR 500 DFR 800 Zemplar 18 mcg IV/HD Epogen 1000 Units IV/HD Venofer none Other profile 4; ferritins consistently in the 2000s and tsats in the 20s  Assessment/Plan: 1. 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 Lajoyce Corners on 10/21; rehab in progress; on Coumadin per Ortho for DVT prophylaxis (1 mg qd for 1 month). 2. ESRD - HD on MWF @ [facility]; K 4.4.  HD tomorrow. 3. Anemia - Hgb 9.2 on Aranesp 25 mcg on  Wed. 4. HTN/Volume - BP 115/63, no meds; wt 68.1 kg s/p net UF 2.3 L yesterday (old EDW 78.5). 5. Secondary hyperparathyroidism - Ca 11.2 (12.2 corrected) on 2Ca bath, P 3.4, Zemplar decreased to 8 mcg, Sensipar 90 mg qd, Renvela 3 with meals. 6. Nutrition - Alb 2.8, on high protein renal diet; added Prostat bid. 7. DM - per primary. 8. Hx CVA - with LUE weakness. 9. Dispo - tentative dc date 11/13 per CIR, will need HHC to be set up prior to dc.   LOS: 14 days   LYLES,CHARLES 12/29/2011,1:31 PM  Patient seen and examined and agree with assessment and plan as above.  Vinson Moselle  MD [facility] [phone] pgr    [phone] cell 12/29/2011, 3:27 PM

## 2011-12-29 NOTE — Progress Notes (Signed)
Occupational Therapy Session Note  Patient Details  Name: Faith [name] MRN: [id number] Date of Birth: 01-04-1956  Today's Date: 12/29/2011 Time: 4098-1191 Time Calculation (min): 75 min  Short Term Goals: Week 2:  OT Short Term Goal 1 (Week 2): STG=LTG  Skilled Therapeutic Interventions/Progress Updates:    Arrived to patient laying sideways in bed this AM. Patient stated that she's had a rough morning and was not up for a shower. ADL re-training completed at bed level this AM. Session with focus on bed <>w/c transfer (with slideboard), bathing, dressing, sitting balance, and bed mobility.  UB dressing performed EOB and performed LB dressing supine in bed. Patient showed improvement during LB dressing. Pt able to pull up underwear and pants by rolling back and forth supine and lean right and left while seated on EOB. Patient stated that she was more awake now that she was moving around. Pt requested to transfer into recliner at end of session.  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)  Required Braces or Orthoses: Knee Immobilizer - Right Knee Immobilizer - Right: On except when in CPM Restrictions Weight Bearing Restrictions: Yes RLE Weight Bearing: Non weight bearing Pain: Pain Assessment Pain Assessment: 0-10 Pain Score:   4 Pain Type: Phantom pain Pain Location: Leg Pain Orientation: Right Pain Descriptors: Shooting Pain Frequency: Constant Pain Onset: Progressive Patients Stated Pain Goal: 2 Pain Intervention(s): Medication (See eMAR) Multiple Pain Sites: No  See FIM for current functional status  Therapy/Group: Individual Therapy  Limmie Patricia, OTR/L  12/29/2011, 9:43 AM

## 2011-12-29 NOTE — Progress Notes (Signed)
Patient ID: Faith [name], female   DOB: 06-Aug-1955, 56 y.o.   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 Lajoyce Corners. Post op started on coumadin for VTE prophylaxis. KI ordered to help keep knee in extension.  ESRD on HD  Subjective/Complaints: Knee immobilizer off in ams.  Pt denies removing it  Slept very well. No pain this am. Very alert. Sitting EOB.       Objective: Vital Signs: Blood pressure 115/63, pulse 80, temperature 98.7 F (37.1 C), temperature source Oral, resp. rate 18, weight 68.1 kg (150 lb 2.1 oz), SpO2 98.00%. No results found. Results for orders placed during the hospital encounter of 12/15/11 (from the past 72 hour(s))  GLUCOSE, CAPILLARY     Status: Abnormal   Collection Time   12/26/11 11:42 AM      Component Value Range Comment   Glucose-Capillary 104 (*) 70 - 99 mg/dL    Comment 1 Notify RN     RENAL FUNCTION PANEL     Status: Abnormal   Collection Time   12/26/11  2:39 PM      Component Value Range Comment   Sodium 127 (*) 135 - 145 mEq/L    Potassium 4.5  3.5 - 5.1 mEq/L    Chloride 87 (*) 96 - 112 mEq/L    CO2 27  19 - 32 mEq/L    Glucose, Bld 130 (*) 70 - 99 mg/dL    BUN 36 (*) 6 - 23 mg/dL    Creatinine, Ser 7.82 (*) 0.50 - 1.10 mg/dL    Calcium 95.6 (*) 8.4 - 10.5 mg/dL    Phosphorus 3.8  2.3 - 4.6 mg/dL    Albumin 2.7 (*) 3.5 - 5.2 g/dL    GFR calc non Af Amer 6 (*) >90 mL/min    GFR calc Af Amer 8 (*) >90 mL/min   CBC     Status: Abnormal   Collection Time   12/26/11  2:50 PM      Component Value Range Comment   WBC 8.9  4.0 - 10.5 K/uL    RBC 3.77 (*) 3.87 - 5.11 MIL/uL    Hemoglobin 9.2 (*) 12.0 - 15.0 g/dL    HCT 21.3 (*) 08.6 - 46.0 %    MCV 78.5  78.0 - 100.0 fL    MCH 24.4 (*) 26.0 - 34.0 pg    MCHC  31.1  30.0 - 36.0 g/dL    RDW 57.8 (*) 46.9 - 15.5 %    Platelets 435 (*) 150 - 400 K/uL   GLUCOSE, CAPILLARY     Status: Normal   Collection Time   12/26/11  7:22 PM      Component Value Range Comment   Glucose-Capillary 81  70 - 99 mg/dL   GLUCOSE, CAPILLARY     Status: Normal   Collection Time   12/26/11  9:19 PM      Component Value Range Comment   Glucose-Capillary 96  70 - 99 mg/dL   PROTIME-INR     Status: Abnormal   Collection Time   12/27/11  7:22 AM      Component Value Range Comment   Prothrombin Time 18.6 (*) 11.6 - 15.2 seconds    INR 1.61 (*) 0.00 - 1.49   GLUCOSE, CAPILLARY  Status: Normal   Collection Time   12/27/11  7:32 AM      Component Value Range Comment   Glucose-Capillary 70  70 - 99 mg/dL    Comment 1 Notify RN     GLUCOSE, CAPILLARY     Status: Abnormal   Collection Time   12/27/11 11:32 AM      Component Value Range Comment   Glucose-Capillary 114 (*) 70 - 99 mg/dL    Comment 1 Notify RN     GLUCOSE, CAPILLARY     Status: Abnormal   Collection Time   12/27/11  4:59 PM      Component Value Range Comment   Glucose-Capillary 132 (*) 70 - 99 mg/dL   GLUCOSE, CAPILLARY     Status: Abnormal   Collection Time   12/27/11  8:59 PM      Component Value Range Comment   Glucose-Capillary 65 (*) 70 - 99 mg/dL   GLUCOSE, CAPILLARY     Status: Normal   Collection Time   12/27/11  9:54 PM      Component Value Range Comment   Glucose-Capillary 76  70 - 99 mg/dL   GLUCOSE, CAPILLARY     Status: Abnormal   Collection Time   12/27/11 10:45 PM      Component Value Range Comment   Glucose-Capillary 101 (*) 70 - 99 mg/dL   PROTIME-INR     Status: Abnormal   Collection Time   12/28/11  5:00 AM      Component Value Range Comment   Prothrombin Time 18.4 (*) 11.6 - 15.2 seconds    INR 1.58 (*) 0.00 - 1.49   GLUCOSE, CAPILLARY     Status: Normal   Collection Time   12/28/11  7:59 AM      Component Value Range Comment   Glucose-Capillary 85  70 - 99 mg/dL     Comment 1 Notify RN     GLUCOSE, CAPILLARY     Status: Abnormal   Collection Time   12/28/11 11:31 AM      Component Value Range Comment   Glucose-Capillary 144 (*) 70 - 99 mg/dL    Comment 1 Notify RN     RENAL FUNCTION PANEL     Status: Abnormal   Collection Time   12/28/11  5:08 PM      Component Value Range Comment   Sodium 130 (*) 135 - 145 mEq/L    Potassium 4.4  3.5 - 5.1 mEq/L    Chloride 90 (*) 96 - 112 mEq/L    CO2 28  19 - 32 mEq/L    Glucose, Bld 90  70 - 99 mg/dL    BUN 35 (*) 6 - 23 mg/dL    Creatinine, Ser 1.61 (*) 0.50 - 1.10 mg/dL    Calcium 09.6 (*) 8.4 - 10.5 mg/dL    Phosphorus 3.4  2.3 - 4.6 mg/dL    Albumin 2.8 (*) 3.5 - 5.2 g/dL    GFR calc non Af Amer 8 (*) >90 mL/min    GFR calc Af Amer 9 (*) >90 mL/min   CBC     Status: Abnormal   Collection Time   12/28/11  5:08 PM      Component Value Range Comment   WBC 8.0  4.0 - 10.5 K/uL    RBC 3.70 (*) 3.87 - 5.11 MIL/uL    Hemoglobin 9.2 (*) 12.0 - 15.0 g/dL    HCT 04.5 (*) 40.9 - 46.0 %  MCV 80.3  78.0 - 100.0 fL    MCH 24.9 (*) 26.0 - 34.0 pg    MCHC 31.0  30.0 - 36.0 g/dL    RDW 40.9 (*) 81.1 - 15.5 %    Platelets 431 (*) 150 - 400 K/uL   GLUCOSE, CAPILLARY     Status: Normal   Collection Time   12/28/11  9:28 PM      Component Value Range Comment   Glucose-Capillary 86  70 - 99 mg/dL    Comment 1 Notify RN     PROTIME-INR     Status: Abnormal   Collection Time   12/29/11  7:05 AM      Component Value Range Comment   Prothrombin Time 17.7 (*) 11.6 - 15.2 seconds    INR 1.50 (*) 0.00 - 1.49   GLUCOSE, CAPILLARY     Status: Abnormal   Collection Time   12/29/11  7:30 AM      Component Value Range Comment   Glucose-Capillary 36 (*) 70 - 99 mg/dL    Comment 1 Notify RN      Comment 2 Repeat Test     GLUCOSE, CAPILLARY     Status: Abnormal   Collection Time   12/29/11  7:31 AM      Component Value Range Comment   Glucose-Capillary 36 (*) 70 - 99 mg/dL    Comment 1 Notify RN      Comment 2 Repeat  Test     GLUCOSE, CAPILLARY     Status: Abnormal   Collection Time   12/29/11  7:33 AM      Component Value Range Comment   Glucose-Capillary 37 (*) 70 - 99 mg/dL    Comment 1 Notify RN     GLUCOSE, CAPILLARY     Status: Normal   Collection Time   12/29/11  8:04 AM      Component Value Range Comment   Glucose-Capillary 75  70 - 99 mg/dL    Comment 1 Notify RN        HEENT: normal Cardio: RRR Resp: Rales GI: BS positive and Distention Extremity:  No Edema Skin:   Other R BKA with dressing and immobilizer- no substantial drainage Neuro: Alert/Oriented, Abnormal Sensory absent proprioception, absent lt touch L foot, Abnormal Motor 2-/5 R ankle DF/PF, Dysarthric and Other tardive dyskinesia lip smacking Musc/Skel:  Other R BKA,  Gen: NAD-very alert    Assessment/Plan: 1. Functional deficits secondary to R BKA, DM with neuropathy which require 3+ hours per day of interdisciplinary therapy in a comprehensive inpatient rehab setting. Physiatrist is providing close team supervision and 24 hour management of active medical problems listed below. Physiatrist and rehab team continue to assess barriers to discharge/monitor patient progress toward functional and medical goals. FIM: FIM - Bathing Bathing Steps Patient Completed: Chest;Right Arm;Left Arm;Abdomen;Front perineal area;Right upper leg;Left upper leg;Buttocks;Left lower leg (including foot) Bathing: 4: Min-Patient completes 8-9 75f 10 parts or 75+ percent  FIM - Upper Body Dressing/Undressing Upper body dressing/undressing steps patient completed: Thread/unthread right sleeve of pullover shirt/dresss;Thread/unthread left sleeve of pullover shirt/dress;Put head through opening of pull over shirt/dress;Pull shirt over trunk Upper body dressing/undressing: 5: Supervision: Safety issues/verbal cues FIM - Lower Body Dressing/Undressing Lower body dressing/undressing steps patient completed: Thread/unthread right underwear  leg;Thread/unthread left underwear leg;Pull underwear up/down;Thread/unthread right pants leg;Thread/unthread left pants leg;Pull pants up/down;Don/Doff left sock;Don/Doff left shoe Lower body dressing/undressing: 1: Total-Patient completed less than 25% of tasks  FIM - Toileting Toileting steps completed  by patient: Performs perineal hygiene Toileting Assistive Devices: Grab bar or rail for support Toileting: 0: Activity did not occur  FIM - Diplomatic Services operational officer Devices: Therapist, music Transfers: 0-Activity did not occur  FIM - Banker Devices: Orthosis;Sliding board Bed/Chair Transfer: 1: Two helpers  FIM - Locomotion: Wheelchair Distance: 150 Locomotion: Wheelchair: 2: Travels 50 - 149 ft with supervision, cueing or coaxing FIM - Locomotion: Ambulation Locomotion: Ambulation: 0: Activity did not occur  Comprehension Comprehension Mode: Auditory Comprehension: 5-Follows basic conversation/direction: With no assist  Expression Expression Mode: Verbal Expression: 5-Expresses basic needs/ideas: With extra time/assistive device  Social Interaction Social Interaction Mode: Asleep Social Interaction: 5-Interacts appropriately 90% of the time - Needs monitoring or encouragement for participation or interaction.  Problem Solving Problem Solving Mode: Asleep Problem Solving: 5-Solves basic 90% of the time/requires cueing < 10% of the time  Memory Memory Mode: Asleep Memory: 3-Recognizes or recalls 50 - 74% of the time/requires cueing 25 - 49% of the time  Medical Problem List and Plan:  1. DVT Prophylaxis/Anticoagulation: Pharmaceutical: Coumadin  2. Pain Management: monitor on prn Medication for now.Reduced gabapentin to 300mg --much more alert,  phantom pain worse start nortriptyline 3. Mood: difficult to judge. Will monitor for now and have LCSW follow for evaluation.  4. Neuropsych: This patient is capable of  making decisions on his/her own behalf.  5. 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 SSI for elevated BS. --reasonable control at present, one low CBG yesterday 6. ESRD: HD M, W, F. Daily weights. Renal diet.  7. Low grade fevers: resolved monitor 8.  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  Multiple small meals 9.  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

## 2011-12-29 NOTE — Progress Notes (Signed)
Recreational Therapy Session Note  Patient Details  Name: Faith [name] MRN: [id number] Date of Birth: [birth year] Today's Date: 12/29/2011 Time:  12-1138 Pain: no c/o Skilled Therapeutic Interventions/Progress Updates: Met with pt and pt's husband and provided education on relaxation/pain management techniques for use post discharge.  Pts husband stated understanding and was able to state how to implement techniques at home.  Left relaxing music (cd of choice) in pt's room for use PRN.  Therapy/Group: Individual Therapy Trapper Meech 12/29/2011, 4:09 PM

## 2011-12-29 NOTE — Progress Notes (Signed)
Social Work Patient ID: Faith [name], female   DOB: Dec 07, 1955, 56 y.o.   MRN: [id number] Met with pt and husband who was here attending therapies.  He has also spoken to [facility] and they are working on a care plan for pt at home. Aware of team conference progression toward goals and discharge 11/13.  Will need 24 hr care plan in place prior to discharge.  Wheelchair eval 11/12. He plans to be here for that and Arita Miss is aware.  Will continue to work with family and [facility] on 24 hr care plan at discharge.

## 2011-12-29 NOTE — Progress Notes (Signed)
ANTICOAGULATION CONSULT NOTE - Follow Up Consult  Pharmacy Consult:  Coumadin Indication: VTE prophylaxis  Allergies  Allergen Reactions  . Metoclopramide Hcl Other (See Comments)    Tardive dyskinesia  . Codeine Nausea And Vomiting  . Lipitor (Atorvastatin Calcium) Other (See Comments)    Muscle weakness    Patient Measurements: Weight: 150 lb 2.1 oz (68.1 kg)  Vital Signs: Temp: 98.7 F (37.1 C) (11/07 0508) Temp src: Oral (11/07 0508) BP: 115/63 mmHg (11/07 0508) Pulse Rate: 80  (11/07 0508)  Labs:  Basename 12/29/11 0705 12/28/11 1708 12/28/11 0500 12/27/11 0722 12/26/11 1450 12/26/11 1439  HGB -- 9.2* -- -- 9.2* --  HCT -- 29.7* -- -- 29.6* --  PLT -- 431* -- -- 435* --  APTT -- -- -- -- -- --  LABPROT 17.7* -- 18.4* 18.6* -- --  INR 1.50* -- 1.58* 1.61* -- --  HEPARINUNFRC -- -- -- -- -- --  CREATININE -- 5.37* -- -- -- 6.41*  CKTOTAL -- -- -- -- -- --  CKMB -- -- -- -- -- --  TROPONINI -- -- -- -- -- --    The CrCl is unknown because both a height and weight (above a minimum accepted value) are required for this calculation.  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  Coumadin was held on 10/23 through 10/25 due to a rapid increase in INR after Coumadin 5mg  x 2 on 10/21 and 10/22.   Patient did have elevated baseline INR.  Coumadin resumed 10/26 at a lower dose and INR was supratherapeutic again.  This may be influenced by poor PO intake and nausea/vomiting.  It has now declined to a subtherapeutic value after holding Warfarin 10/30 and giving 0.5mg  10/31-11/1.  Dose was increased to 2mg  on 11/5 and INR remains subtherapeutic.  I will wait one more day before increasing her regimen given her history of supratherapeutic INR responses this admit.  Goal of Therapy:  INR 2-3   Plan:  Try Coumadin 2mg  x 1 today Daily PT/INR  Estella Husk, Pharm.D., BCPS Clinical Pharmacist  Phone [phone] Pager [phone] 12/29/2011,  10:21 AM

## 2011-12-29 NOTE — Plan of Care (Signed)
Problem: RH Dressing Goal: LTG Patient will perform lower body dressing w/assist (OT) LTG: Patient will perform lower body dressing with assist, with/without cues in positioning using equipment (OT)  Downgraded        

## 2011-12-29 NOTE — Progress Notes (Signed)
Speech Language Pathology Daily Session Note & Weekly Progress Note  Patient Details  Name: Faith [name] MRN: [id number] Date of Birth: [birth year]  Today's Date: 12/29/2011 Time: 1310-1405 Time Calculation (min): 55 min  Short Term Goals: Week 1: SLP Short Term Goal 1 (Week 1): Patient will utilize external aids to facilitate increased recall of new information with Min A visual and verbal cuing.   SLP Short Term Goal 1 - Progress (Week 1): Progressing toward goal SLP Short Term Goal 2 (Week 1): Patient will demonstrate improved emergent awareness by asking for help when needed with Min A verbal cuing. SLP Short Term Goal 2 - Progress (Week 1): Met SLP Short Term Goal 3 (Week 1): Patient will solve basic problems during functional tasks with Min A visual and verbal cuing.   SLP Short Term Goal 3 - Progress (Week 1): Met  Skilled Therapeutic Interventions: Treatment session focused on addressing cognitive goals during a self care task.  Husband present for session and SLP facilitated session with meal planning task using menu and handout from dietician regarding renal diet recommendations.  Patient required mod assist verbal and visual cues utilize handout duirng task and husband required supervision level semantic cues that were faded to no assist.  Hasband completed task with paitent at end of therapy session.     FIM:  Comprehension Comprehension Mode: Auditory Comprehension: 5-Follows basic conversation/direction: With extra time/assistive device Expression Expression Mode: Verbal Expression: 5-Expresses basic needs/ideas: With extra time/assistive device Social Interaction Social Interaction: 6-Interacts appropriately with others with medication or extra time (anti-anxiety, antidepressant). Problem Solving Problem Solving: 5-Solves basic 90% of the time/requires cueing < 10% of the time Memory Memory: 3-Recognizes or recalls 50 - 74% of the time/requires cueing 25 - 49% of the  time FIM - Eating Eating Activity: 6: Assistive device: dentures  Pain Pain Assessment Pain Assessment: 0-10 Pain Score:  0  Therapy/Group: Individual Therapy   Speech Language Pathology Weekly Progress Note  Patient Details  Name: Faith [name] MRN: [id number] Date of Birth: 03/13/1955  Today's Date: 12/29/2011  Short Term Goals: Week 1: SLP Short Term Goal 1 (Week 1): Patient will utilize external aids to facilitate increased recall of new information with Min A visual and verbal cuing.   SLP Short Term Goal 1 - Progress (Week 1): Progressing toward goal SLP Short Term Goal 2 (Week 1): Patient will demonstrate improved emergent awareness by asking for help when needed with Min A verbal cuing. SLP Short Term Goal 2 - Progress (Week 1): Met SLP Short Term Goal 3 (Week 1): Patient will solve basic problems during functional tasks with Min A visual and verbal cuing.   SLP Short Term Goal 3 - Progress (Week 1): Met Week 2: SLP Short Term Goal 1 (Week 2): Patient will utilize external aids to facilitate increased recall of new information with Min A visual and verbal cuing. SLP Short Term Goal 2 (Week 2): Patient will demonstrate improved anticipatory awareness during discharge discussions with Min A verbal cuing.  SLP Short Term Goal 3 (Week 2): Patient will solve basic problems during functional tasks with Supervision visual and verbal cuing.  Weekly Progress Updates: Patient met 2 out of 3 short term objectives this reporting period with functional gains in basic problem solving and emergent awareness of deficits.  Patient has also made gains in the use of external memory aids but has not carryover initiation of generating aids.  Therefore it is recommended that this patient continue to recieve  skilled SLP services to address these deficits and complete family education prior to discharge.    SLP Frequency: 1-2 X/day, 30-60 minutes;5 out of 7 days Estimated Length of Stay:  anticipated discharge date  SLP Treatment/Interventions: Cognitive remediation/compensation;Cueing hierarchy;Functional tasks;Environmental controls;Internal/external aids;Medication managment;Patient/family education;Therapeutic Activities  Charlane Ferretti., CCC-SLP [phone]  Clement Deneault 12/29/2011, 4:32 PM

## 2011-12-29 NOTE — Significant Event (Signed)
Hypoglycemic Event  CBG: 37  Treatment: 15 GM carbohydrate snack and breakfast and glucose gel  Symptoms: Groggy  Follow-up CBG: Time:0805 CBG Result:75  Possible Reasons for Event: Inadequate meal intake  Comments/MD notified:yes Marissa Nestle, PA 1610    Hedy Camara  Remember to initiate Hypoglycemia Order Set & complete

## 2011-12-29 NOTE — Progress Notes (Signed)
Patient ID: Faith [name], female   DOB: 1955/03/01, 56 y.o.   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 Lajoyce Corners. Post op started on coumadin for VTE prophylaxis. KI ordered to help keep knee in extension.  ESRD on HD  Subjective/Complaints: Knee immobilizer off in ams.  Pt denies removing it  Slept very well. No pain this am. Very alert. Sitting EOB. Low CBG this am       Objective: Vital Signs: Blood pressure 115/63, pulse 80, temperature 98.7 F (37.1 C), temperature source Oral, resp. rate 18, weight 68.1 kg (150 lb 2.1 oz), SpO2 98.00%. No results found. Results for orders placed during the hospital encounter of 12/15/11 (from the past 72 hour(s))  GLUCOSE, CAPILLARY     Status: Abnormal   Collection Time   12/26/11 11:42 AM      Component Value Range Comment   Glucose-Capillary 104 (*) 70 - 99 mg/dL    Comment 1 Notify RN     RENAL FUNCTION PANEL     Status: Abnormal   Collection Time   12/26/11  2:39 PM      Component Value Range Comment   Sodium 127 (*) 135 - 145 mEq/L    Potassium 4.5  3.5 - 5.1 mEq/L    Chloride 87 (*) 96 - 112 mEq/L    CO2 27  19 - 32 mEq/L    Glucose, Bld 130 (*) 70 - 99 mg/dL    BUN 36 (*) 6 - 23 mg/dL    Creatinine, Ser 4.69 (*) 0.50 - 1.10 mg/dL    Calcium 62.9 (*) 8.4 - 10.5 mg/dL    Phosphorus 3.8  2.3 - 4.6 mg/dL    Albumin 2.7 (*) 3.5 - 5.2 g/dL    GFR calc non Af Amer 6 (*) >90 mL/min    GFR calc Af Amer 8 (*) >90 mL/min   CBC     Status: Abnormal   Collection Time   12/26/11  2:50 PM      Component Value Range Comment   WBC 8.9  4.0 - 10.5 K/uL    RBC 3.77 (*) 3.87 - 5.11 MIL/uL    Hemoglobin 9.2 (*) 12.0 - 15.0 g/dL    HCT 52.8 (*) 41.3 - 46.0 %    MCV 78.5  78.0 - 100.0 fL    MCH 24.4 (*) 26.0 -  34.0 pg    MCHC 31.1  30.0 - 36.0 g/dL    RDW 24.4 (*) 01.0 - 15.5 %    Platelets 435 (*) 150 - 400 K/uL   GLUCOSE, CAPILLARY     Status: Normal   Collection Time   12/26/11  7:22 PM      Component Value Range Comment   Glucose-Capillary 81  70 - 99 mg/dL   GLUCOSE, CAPILLARY     Status: Normal   Collection Time   12/26/11  9:19 PM      Component Value Range Comment   Glucose-Capillary 96  70 - 99 mg/dL   PROTIME-INR     Status: Abnormal   Collection Time   12/27/11  7:22 AM      Component Value Range Comment   Prothrombin Time 18.6 (*) 11.6 - 15.2 seconds    INR 1.61 (*) 0.00 - 1.49   GLUCOSE,  CAPILLARY     Status: Normal   Collection Time   12/27/11  7:32 AM      Component Value Range Comment   Glucose-Capillary 70  70 - 99 mg/dL    Comment 1 Notify RN     GLUCOSE, CAPILLARY     Status: Abnormal   Collection Time   12/27/11 11:32 AM      Component Value Range Comment   Glucose-Capillary 114 (*) 70 - 99 mg/dL    Comment 1 Notify RN     GLUCOSE, CAPILLARY     Status: Abnormal   Collection Time   12/27/11  4:59 PM      Component Value Range Comment   Glucose-Capillary 132 (*) 70 - 99 mg/dL   GLUCOSE, CAPILLARY     Status: Abnormal   Collection Time   12/27/11  8:59 PM      Component Value Range Comment   Glucose-Capillary 65 (*) 70 - 99 mg/dL   GLUCOSE, CAPILLARY     Status: Normal   Collection Time   12/27/11  9:54 PM      Component Value Range Comment   Glucose-Capillary 76  70 - 99 mg/dL   GLUCOSE, CAPILLARY     Status: Abnormal   Collection Time   12/27/11 10:45 PM      Component Value Range Comment   Glucose-Capillary 101 (*) 70 - 99 mg/dL   PROTIME-INR     Status: Abnormal   Collection Time   12/28/11  5:00 AM      Component Value Range Comment   Prothrombin Time 18.4 (*) 11.6 - 15.2 seconds    INR 1.58 (*) 0.00 - 1.49   GLUCOSE, CAPILLARY     Status: Normal   Collection Time   12/28/11  7:59 AM      Component Value Range Comment   Glucose-Capillary 85  70 - 99  mg/dL    Comment 1 Notify RN     GLUCOSE, CAPILLARY     Status: Abnormal   Collection Time   12/28/11 11:31 AM      Component Value Range Comment   Glucose-Capillary 144 (*) 70 - 99 mg/dL    Comment 1 Notify RN     RENAL FUNCTION PANEL     Status: Abnormal   Collection Time   12/28/11  5:08 PM      Component Value Range Comment   Sodium 130 (*) 135 - 145 mEq/L    Potassium 4.4  3.5 - 5.1 mEq/L    Chloride 90 (*) 96 - 112 mEq/L    CO2 28  19 - 32 mEq/L    Glucose, Bld 90  70 - 99 mg/dL    BUN 35 (*) 6 - 23 mg/dL    Creatinine, Ser 8.11 (*) 0.50 - 1.10 mg/dL    Calcium 91.4 (*) 8.4 - 10.5 mg/dL    Phosphorus 3.4  2.3 - 4.6 mg/dL    Albumin 2.8 (*) 3.5 - 5.2 g/dL    GFR calc non Af Amer 8 (*) >90 mL/min    GFR calc Af Amer 9 (*) >90 mL/min   CBC     Status: Abnormal   Collection Time   12/28/11  5:08 PM      Component Value Range Comment   WBC 8.0  4.0 - 10.5 K/uL    RBC 3.70 (*) 3.87 - 5.11 MIL/uL    Hemoglobin 9.2 (*) 12.0 - 15.0 g/dL    HCT 78.2 (*) 95.6 -  46.0 %    MCV 80.3  78.0 - 100.0 fL    MCH 24.9 (*) 26.0 - 34.0 pg    MCHC 31.0  30.0 - 36.0 g/dL    RDW 16.1 (*) 09.6 - 15.5 %    Platelets 431 (*) 150 - 400 K/uL   GLUCOSE, CAPILLARY     Status: Normal   Collection Time   12/28/11  9:28 PM      Component Value Range Comment   Glucose-Capillary 86  70 - 99 mg/dL    Comment 1 Notify RN     PROTIME-INR     Status: Abnormal   Collection Time   12/29/11  7:05 AM      Component Value Range Comment   Prothrombin Time 17.7 (*) 11.6 - 15.2 seconds    INR 1.50 (*) 0.00 - 1.49   GLUCOSE, CAPILLARY     Status: Abnormal   Collection Time   12/29/11  7:30 AM      Component Value Range Comment   Glucose-Capillary 36 (*) 70 - 99 mg/dL    Comment 1 Notify RN      Comment 2 Repeat Test     GLUCOSE, CAPILLARY     Status: Abnormal   Collection Time   12/29/11  7:31 AM      Component Value Range Comment   Glucose-Capillary 36 (*) 70 - 99 mg/dL    Comment 1 Notify RN      Comment  2 Repeat Test     GLUCOSE, CAPILLARY     Status: Abnormal   Collection Time   12/29/11  7:33 AM      Component Value Range Comment   Glucose-Capillary 37 (*) 70 - 99 mg/dL    Comment 1 Notify RN        HEENT: normal Cardio: RRR Resp: occasional rhonchi otherwise clear.  GI: BS positive and non-tender.  Extremity:  No Edema Skin:   Other R BKA with staples intact. Minimal dry blood on old dressing.  Ischemic looking edges. Edema resolving nicely. Neuro: Alert/Oriented, Abnormal Sensory absent proprioception, absent lt touch L foot, Abnormal Motor 2-/5 R ankle DF/PF, Dysarthric and Other tardive dyskinesia lip smacking Musc/Skel:  Other R BKA,  Gen: NAD-very alert    Assessment/Plan: 1. Functional deficits secondary to R BKA, DM with neuropathy which require 3+ hours per day of interdisciplinary therapy in a comprehensive inpatient rehab setting. Physiatrist is providing close team supervision and 24 hour management of active medical problems listed below. Physiatrist and rehab team continue to assess barriers to discharge/monitor patient progress toward functional and medical goals. FIM: FIM - Bathing Bathing Steps Patient Completed: Chest;Right Arm;Left Arm;Abdomen;Front perineal area;Right upper leg;Left upper leg;Buttocks;Left lower leg (including foot) Bathing: 4: Min-Patient completes 8-9 24f 10 parts or 75+ percent  FIM - Upper Body Dressing/Undressing Upper body dressing/undressing steps patient completed: Thread/unthread right sleeve of pullover shirt/dresss;Thread/unthread left sleeve of pullover shirt/dress;Put head through opening of pull over shirt/dress;Pull shirt over trunk Upper body dressing/undressing: 5: Supervision: Safety issues/verbal cues FIM - Lower Body Dressing/Undressing Lower body dressing/undressing steps patient completed: Thread/unthread right underwear leg;Thread/unthread left underwear leg;Pull underwear up/down;Thread/unthread right pants  leg;Thread/unthread left pants leg;Pull pants up/down;Don/Doff left sock;Don/Doff left shoe Lower body dressing/undressing: 1: Total-Patient completed less than 25% of tasks  FIM - Toileting Toileting steps completed by patient: Performs perineal hygiene Toileting Assistive Devices: Grab bar or rail for support Toileting: 0: Activity did not occur  FIM - Diplomatic Services operational officer  Devices: Therapist, music Transfers: 0-Activity did not occur  FIM - Banker Devices: Orthosis;Sliding board Bed/Chair Transfer: 1: Two helpers  FIM - Locomotion: Wheelchair Distance: 150 Locomotion: Wheelchair: 2: Travels 50 - 149 ft with supervision, cueing or coaxing FIM - Locomotion: Ambulation Locomotion: Ambulation: 0: Activity did not occur  Comprehension Comprehension Mode: Auditory Comprehension: 5-Follows basic conversation/direction: With no assist  Expression Expression Mode: Verbal Expression: 5-Expresses basic needs/ideas: With extra time/assistive device  Social Interaction Social Interaction Mode: Asleep Social Interaction: 5-Interacts appropriately 90% of the time - Needs monitoring or encouragement for participation or interaction.  Problem Solving Problem Solving Mode: Asleep Problem Solving: 5-Solves basic 90% of the time/requires cueing < 10% of the time  Memory Memory Mode: Asleep Memory: 3-Recognizes or recalls 50 - 74% of the time/requires cueing 25 - 49% of the time  Medical Problem List and Plan:  1. DVT Prophylaxis/Anticoagulation: Pharmaceutical: Coumadin  2. Pain Management: monitor on prn Medication for now.Reduced gabapentin to 300mg --much more alert, if phantom pain worsens consider nortriptyline. Pain a limiting factor.  Needed dilaudid in dialysis last pm.  Will schedule ultram for more consistent pain relief during the day.  3. Mood: no signs of depressed mood.  Will monitor for now and have LCSW follow  for evaluation.  4. Neuropsych: This patient is capable of making decisions on his/her own behalf.  5. 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  Will cover BS with SSI for now.  PO intake has been very poor per nursing.  Will liberalize diet to offer more food choices.  6. ESRD: HD M, W, F. Daily weights.  7. Low grade fevers: resolved monitor 8.  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  Multiple small meals 9.  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

## 2011-12-29 NOTE — Progress Notes (Addendum)
Nutrition Follow-up  Intervention:   1. Continue supplements 2. RD to continue to follow nutrition care plan.  Assessment:   RD consulted for snacks. Intake beginning to improve. Liberalized to a Regular diet this morning by PA Pam Love. Discussed education needs with PA and SLP. Pt unable to verbalize understanding of renal diet. Provided materials to SLP to aid with teaching.  Pt states that she is eating well when her husband provides foods for her. Declines snacks.  Diet Order:  Regular; 1200 ml fluid restriction Supplement: Nepro Shake PO BID; Resource Breeze daily  Meds: Scheduled Meds:    . aspirin EC  81 mg Oral Daily  . cinacalcet  30 mg Oral Daily  . cinacalcet  90 mg Oral QPM  . darbepoetin (ARANESP) injection - DIALYSIS  25 mcg Intravenous Q Wed-HD  . [COMPLETED] dextrose      . ezetimibe  10 mg Oral Daily  . feeding supplement (NEPRO CARB STEADY)  237 mL Oral BID BM  . feeding supplement  1 Container Oral Q24H  . fenofibrate  54 mg Oral Daily  . gabapentin  300 mg Oral QHS  . [COMPLETED]  HYDROmorphone (DILAUDID) injection  1 mg Intravenous Once  . insulin aspart  0-9 Units Subcutaneous TID WC  . insulin aspart  3 Units Subcutaneous TID WC  . multivitamin  1 tablet Oral QHS  . Muscle Rub   Topical TID  . pantoprazole  40 mg Oral Q1200  . paricalcitol  8 mcg Intravenous Q M,W,F-HD  . senna-docusate  1 tablet Oral BID  . sevelamer  2,400 mg Oral TID WC  . traMADol  50 mg Oral TID AC  . [COMPLETED] warfarin  2 mg Oral ONCE-1800  . warfarin  2 mg Oral ONCE-1800  . Warfarin - Pharmacist Dosing Inpatient   Does not apply q1800  . [DISCONTINUED] insulin glargine  6 Units Subcutaneous QHS   Continuous Infusions:  PRN Meds:.acetaminophen, albuterol, calcium carbonate (dosed in mg elemental calcium), camphor-menthol, dextrose, docusate sodium, feeding supplement (NEPRO CARB STEADY), Glycerin (Adult), guaiFENesin-dextromethorphan, heparin, heparin, heparin, heparin,  heparin, heparin, HYDROcodone-acetaminophen, hydrOXYzine, lidocaine, lidocaine-prilocaine, methocarbamol, naphazoline, nitroGLYCERIN, ondansetron (ZOFRAN) IV, ondansetron, pentafluoroprop-tetrafluoroeth polyvinyl alcohol, traZODone, [DISCONTINUED] HYDROcodone-acetaminophen   CMP     Component Value Date/Time   NA 130* 12/28/2011 1708   K 4.4 12/28/2011 1708   CL 90* 12/28/2011 1708   CO2 28 12/28/2011 1708   GLUCOSE 90 12/28/2011 1708   BUN 35* 12/28/2011 1708   CREATININE 5.37* 12/28/2011 1708   CALCIUM 11.2* 12/28/2011 1708   CALCIUM 9.8 01/18/2011 0540   PROT 8.0 12/12/2011 1414   ALBUMIN 2.8* 12/28/2011 1708   AST 30 12/12/2011 1414   ALT 18 12/12/2011 1414   ALKPHOS 111 12/12/2011 1414   BILITOT 1.8* 12/12/2011 1414   GFRNONAA 8* 12/28/2011 1708   GFRAA 9* 12/28/2011 1708   Phosphorus  Date/Time Value Range Status  12/28/2011  5:08 PM 3.4  2.3 - 4.6 mg/dL Final    CBG (last 3)   Basename 12/29/11 1130 12/29/11 0804 12/29/11 0733  GLUCAP 143* 75 37*     Intake/Output Summary (Last 24 hours) at 12/29/11 1419 Last data filed at 12/29/11 1330  Gross per 24 hour  Intake    720 ml  Output   2302 ml  Net  -1582 ml  BM 11/5  Weight Status:  154 lb s/p HD on 11/4; stable 154 lb s/p HD on 10/30  Estimated needs:  2000 - 2200 kcal, 86 -  100 grams protein  Nutrition Dx:  Inadequate oral intake r/t GI distress AEB pt report and decreasing weight.  Goal:  Pt to meet >/= 90% of their estimated nutrition needs  Monitor:  weight trends, lab trends, I/O's, PO intake, supplement tolerance  Jarold Motto MS, RD, LDN Pager: [phone] After-hours pager: [phone]

## 2011-12-29 NOTE — Progress Notes (Signed)
Physical Therapy Session Note  Patient Details  Name: Faith [name] MRN: [id number] Date of Birth: 1955-09-13  Today's Date: 12/29/2011 Time: 1005-1100 Time Calculation (min): 55 min  Short Term Goals: Week 2:  PT Short Term Goal 1 (Week 2): Pt will perform all bed mobility with supervision. PT Short Term Goal 2 (Week 2): Pt will perform simulated car transfer with mod assist. PT Short Term Goal 3 (Week 2): Pt will perform SB transfer w/c> 22" surface with close superviison.   PT Short Term Goal 4 (Week 2): Pt will sit> stand with RW with assist of 1 person. PT Short Term Goal 5 (Week 2): Pt will stand with RW x 2 minutes during functional activity.      Skilled Therapeutic Interventions/Progress Updates: husband Fayrene Fearing here for family ed.  Recliner> w/c SB transfer, with armrest of recliner lowered, with min assist due to slightly uphill transfer..  W/c > simulated car; seat set at 21" ht per husband's measurements, with min assist for LLE, otherwise stand by assist after SB set-up. Both above transfers performed safely by husband.  Discussed and demonstrated folding up of w/c, removal of legrests and armrest flip back.  LLE strengthening on Kinetron in sitting in w/c, counter wt on R foot box, hip and knee extension x 3 sets of 10; bil hip abduction against resistance of light green Theraband, and bil hip adduction squeezing ball between thighs, 2 sets of 10 each.  W/c mobility using bil UEs x 50' with cues for steering as currently issued w/c veers R.       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)  Required Braces or Orthoses: Knee Immobilizer - Right Knee Immobilizer - Right: On except when in CPM Restrictions Weight Bearing Restrictions: Yes RLE Weight Bearing: Non weight bearing   Pain: Pain Assessment Pain Assessment: 0-10 Pain Score:   5 Pain Type: surgical Pain Location:  Leg Pain Orientation: Right atients Stated Pain Goal: 2 Pain Intervention(s): Medication (See eMAR) Multiple Pain Sites: No         See FIM for current functional status  Therapy/Group: Individual Therapy  Alycia Cooperwood 12/29/2011, 11:04 AM

## 2011-12-30 ENCOUNTER — Inpatient Hospital Stay (HOSPITAL_COMMUNITY): Payer: Medicare (Managed Care)

## 2011-12-30 ENCOUNTER — Inpatient Hospital Stay (HOSPITAL_COMMUNITY): Payer: Medicare (Managed Care) | Admitting: Speech Pathology

## 2011-12-30 ENCOUNTER — Inpatient Hospital Stay (HOSPITAL_COMMUNITY): Payer: Medicare (Managed Care) | Admitting: *Deleted

## 2011-12-30 ENCOUNTER — Inpatient Hospital Stay (HOSPITAL_COMMUNITY): Payer: Medicare (Managed Care) | Admitting: Occupational Therapy

## 2011-12-30 LAB — CBC
MCV: 79.2 fL (ref 78.0–100.0)
Platelets: 369 10*3/uL (ref 150–400)
RBC: 4.04 MIL/uL (ref 3.87–5.11)
WBC: 6.8 10*3/uL (ref 4.0–10.5)

## 2011-12-30 LAB — RENAL FUNCTION PANEL
CO2: 27 mEq/L (ref 19–32)
Chloride: 87 mEq/L — ABNORMAL LOW (ref 96–112)
Creatinine, Ser: 4.88 mg/dL — ABNORMAL HIGH (ref 0.50–1.10)
GFR calc Af Amer: 10 mL/min — ABNORMAL LOW (ref >90)
GFR calc non Af Amer: 9 mL/min — ABNORMAL LOW (ref >90)

## 2011-12-30 LAB — GLUCOSE, CAPILLARY
Glucose-Capillary: 94 mg/dL (ref 70–99)
Glucose-Capillary: 124 mg/dL — ABNORMAL HIGH (ref 70–99)
Glucose-Capillary: 98 mg/dL (ref 70–99)

## 2011-12-30 MED ORDER — WARFARIN SODIUM 3 MG PO TABS
3.0000 mg | ORAL_TABLET | Freq: Once | ORAL | Status: AC
  Administered 2011-12-31: 3 mg via ORAL
  Filled 2011-12-30: qty 1

## 2011-12-30 MED ORDER — BISACODYL 10 MG RE SUPP
10.0000 mg | Freq: Every day | RECTAL | Status: DC | PRN
  Administered 2011-12-30 – 2012-01-02 (×2): 10 mg via RECTAL
  Filled 2011-12-30 (×3): qty 1

## 2011-12-30 MED ORDER — PARICALCITOL 5 MCG/ML IV SOLN
INTRAVENOUS | Status: AC
  Administered 2011-12-30: 8 ug via INTRAVENOUS
  Filled 2011-12-30: qty 2

## 2011-12-30 NOTE — Progress Notes (Signed)
Subjective:  About to go to PT ,no cos, no sob for hd today Objective Vital signs in last 24 hours: Filed Vitals:   12/28/11 2121 12/29/11 0508 12/29/11 1644 12/30/11 0500  BP: 105/55 115/63 126/73 128/69  Pulse: 78 80 85 80  Temp: 98.7 F (37.1 C) 98.7 F (37.1 C) 99.5 F (37.5 C) 98.7 F (37.1 C)  TempSrc: Oral Oral Oral Oral  Resp: 17 18 20 19   Weight:  68.1 kg (150 lb 2.1 oz)  70.4 kg (155 lb 3.3 oz)  SpO2: 96% 98% 94% 95%   Weight change: -2.5 kg (-5 lb 8.2 oz)  Intake/Output Summary (Last 24 hours) at 12/30/11 1116 Last data filed at 12/30/11 0900  Gross per 24 hour  Intake    480 ml  Output      0 ml  Net    480 ml   Labs: Basic Metabolic Panel:  Lab 12/28/11 1610 12/26/11 1439 12/26/11 0730  NA 130* 127* 130*  K 4.4 4.5 4.7  CL 90* 87* 90*  CO2 28 27 28   GLUCOSE 90 130* 79  BUN 35* 36* 31*  CREATININE 5.37* 6.41* 6.24*  CALCIUM 11.2* 10.7* 10.9*  ALB -- -- --  PHOS 3.4 3.8 3.9   Liver Function Tests:  Lab 12/28/11 1708 12/26/11 1439 12/26/11 0730  AST -- -- --  ALT -- -- --  ALKPHOS -- -- --  BILITOT -- -- --  PROT -- -- --  ALBUMIN 2.8* 2.7* 2.8*   No results found for this basename: LIPASE:3,AMYLASE:3 in the last 168 hours No results found for this basename: AMMONIA:3 in the last 168 hours CBC:  Lab 12/28/11 1708 12/26/11 1450 12/26/11 0730  WBC 8.0 8.9 8.9  NEUTROABS -- -- --  HGB 9.2* 9.2* 9.9*  HCT 29.7* 29.6* 31.9*  MCV 80.3 78.5 79.4  PLT 431* 435* 464*   Cardiac Enzymes: No results found for this basename: CKTOTAL:5,CKMB:5,CKMBINDEX:5,TROPONINI:5 in the last 168 hours CBG:  Lab 12/30/11 0729 12/29/11 2106 12/29/11 1643 12/29/11 1130 12/29/11 0804  GLUCAP 94 85 109* 143* 75    Iron Studies: No results found for this basename: IRON,TIBC,TRANSFERRIN,FERRITIN in the last 72 hours Studies/Results: No results found. Medications:      . aspirin EC  81 mg Oral Daily  . cinacalcet  30 mg Oral Daily  . cinacalcet  90 mg Oral QPM    . darbepoetin (ARANESP) injection - DIALYSIS  25 mcg Intravenous Q Wed-HD  . ezetimibe  10 mg Oral Daily  . feeding supplement (NEPRO CARB STEADY)  237 mL Oral BID BM  . feeding supplement  1 Container Oral Q24H  . fenofibrate  54 mg Oral Daily  . gabapentin  300 mg Oral QHS  . insulin aspart  0-9 Units Subcutaneous TID WC  . insulin aspart  3 Units Subcutaneous TID WC  . multivitamin  1 tablet Oral QHS  . Muscle Rub   Topical TID  . pantoprazole  40 mg Oral Q1200  . paricalcitol  8 mcg Intravenous Q M,W,F-HD  . senna-docusate  1 tablet Oral BID  . sevelamer  2,400 mg Oral TID WC  . traMADol  50 mg Oral TID AC  . [COMPLETED] warfarin  2 mg Oral ONCE-1800  . Warfarin - Pharmacist Dosing Inpatient   Does not apply q1800   I  have reviewed scheduled and prn medications.  Physical Exam: General: Alert, NAD Heart: RRR,Soft 1/6 sem lsb , no rub Lungs: CTA bilaterally no rales  or wheezes Abdomen:  Soft , nointrender Extremities: Dialysis Access: no pedal edema left, R BKA, pos. Bruit RUA AVG   Dialysis Orders: Center: [facility] on MWF Optiflux 180  EDW 78.5 HD Bath 2K/2.0Ca bath Time3.5 Heparin 7000. Access right upper AVGG BFR 500 DFR 800 Zemplar 18 mcg IV/HD Epogen 1000 Units IV/HD Venofer none Other profile 4; ferritins consistently in the 2000s and tsats in the 20s   Assessment/Plan:  1. 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 Lajoyce Corners on 10/21; rehab in progress; on Coumadin per Ortho for DVT prophylaxis (1 mg qd for 1 month). 2. ESRD - HD on MWF @ [facility];. HD today 3. Anemia - Hgb 9.2 on Aranesp 25 mcg on Wed. 4. HTN/Volume - BP 128/69, no meds; am wt 70.4 kg/ wt 68.1 kg yesterday(old EDW 78.5). 5. Secondary hyperparathyroidism -  yesterday labs Ca 11.2 (12.2 corrected) on 2Ca bath, P 3.4, Zemplar decreased to 8 mcg, Sensipar 90 mg qd, Renvela 3 with meals. Labs pre hd pending today 6. Nutrition - Alb 2.8, on high protein renal diet; added Prostat bid. 7. DM - per primary. 8. Hx  CVA - with LUE weakness. 9. Dispo - tentative dc date 11/13 per CIR, will need HHC to be set up prior to dc.  Lenny Pastel, PA-C [facility] Beeper [phone] 12/30/2011,11:16 AM  LOS: 15 days   Patient seen and examined and agree with assessment and plan as above.  Vinson Moselle  MD [facility] [phone] pgr    [phone] cell 12/30/2011, 2:37 PM

## 2011-12-30 NOTE — Progress Notes (Signed)
ANTICOAGULATION CONSULT NOTE - Follow Up Consult  Pharmacy Consult:  Coumadin Indication: VTE prophylaxis  Allergies  Allergen Reactions  . Metoclopramide Hcl Other (See Comments)    Tardive dyskinesia  . Codeine Nausea And Vomiting  . Lipitor (Atorvastatin Calcium) Other (See Comments)    Muscle weakness    Patient Measurements: Weight: 155 lb 3.3 oz (70.4 kg)  Vital Signs:    Labs:  Basename 12/30/11 2051 12/29/11 0705 12/28/11 1708 12/28/11 0500  HGB 9.8* -- 9.2* --  HCT 32.0* -- 29.7* --  PLT 369 -- 431* --  APTT -- -- -- --  LABPROT 17.7* 17.7* -- 18.4*  INR 1.50* 1.50* -- 1.58*  HEPARINUNFRC -- -- -- --  CREATININE -- -- 5.37* --  CKTOTAL -- -- -- --  CKMB -- -- -- --  TROPONINI -- -- -- --    The CrCl is unknown because both a height and weight (above a minimum accepted value) are required for this calculation.  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  Coumadin was held on 10/23 through 10/25 due to a rapid increase in INR after Coumadin 5mg  x 2 on 10/21 and 10/22.   Patient did have elevated baseline INR.  Coumadin resumed 10/26 at a lower dose and INR was supratherapeutic again.  This may be influenced by poor PO intake and nausea/vomiting.  It has now declined to a subtherapeutic value after holding Warfarin 10/30 and giving 0.5mg  10/31-11/1.  Dose was increased to 2mg  on 11/5 -11/7 and INR remains subtherapeutic (1.5). Will plan to increase slowly. .   Goal of Therapy:  INR 2-3   Plan:  -Coumadin 3mg  x 1 today -Daily PT/INR  Harland German, Pharm D 12/30/2011 9:44 PM

## 2011-12-30 NOTE — Progress Notes (Addendum)
Physical Therapy Weekly Progress Note  Patient Details  Name: Faith [name] MRN: [id number] Date of Birth: [birth year]  Today's Date: 12/30/2011 Time: 1100-1200 Time Calculation (min): 60 min  Patient has met 3 of 5 short term goals.  Standing continues to be very challenging for pt due to long-term weakness/motor control issues LLE due to previous CVA.  Patient continues to demonstrate the following deficits: strength, muscle timing and sequencing, acute surgical and phantom pain, standing endurance, activity tolerance, cognition, and awareness and therefore will continue to benefit from skilled PT intervention to enhance overall performance with activity tolerance, balance, postural control, ability to compensate for deficits, functional use of  left lower extremity and awareness. Pain, limited activity tolerance, and LUE weakness limit her w/c propulsion distance.  Patient not progressing toward all long term goals.  See goal revision..  Plan of care revisions: Standing balance goal discontinued; w/c evaluation goal added.  W/c and cushion eval set for Tues, 01/03/12 with Newman Nickels, ATC.  Husband and a [facility] staff person to be present, also.  PT Short Term Goals Week 2:  PT Short Term Goal 1 (Week 2): Pt will perform all bed mobility with supervision. PT Short Term Goal 1 - Progress (Week 2): Met PT Short Term Goal 2 (Week 2): Pt will perform simulated car transfer with mod assist. PT Short Term Goal 2 - Progress (Week 2): Met PT Short Term Goal 3 (Week 2): Pt will perform SB transfer w/c> 22" surface with close superviison.   PT Short Term Goal 3 - Progress (Week 2): Met PT Short Term Goal 4 (Week 2): Pt will sit> stand with RW with assist of 1 person. PT Short Term Goal 4 - Progress (Week 2): Not met PT Short Term Goal 5 (Week 2): Pt will stand with RW x 2 minutes during functional activity. PT Short Term Goal 5 - Progress (Week 2): Not met      Skilled Therapeutic  Interventions/Progress Updates:  Pt demonstrated poor awareness of deficits by questioning why her LLE is weaker than her RLe.  Pt apparently overused her RLE and never regained significant functional strength in LLE, after CVA 10 years ago.  Bedside,therapeutic exercise performed with LEs and UEs to increase strength for functional mobility; knee immobilizer doffed:   - in supine- contract/relax method for stretching R hamstrings; 2 x 10 each  bil bridging/pelvic tilt  with L thigh over bolster, bil terminal  knee extension over bolster (extensor lag noted;  LLE), bil shoulder protraction, 10 x 1 L ankle DF/PF followed by PROM, resisted bil hip adduction  - In R sidelying,  active assistive L hip extension , L hip abduction - In L sidelying, resisted R hip extension; active assistive abduction  Rolling L>< R without use of rails, with VCs, tactile cues to activate abdominal muscles.  Donned KI RLE  Supine> sit with extra time, without use of rail, with supervision, VCs.  Attempted sit> stand > w/c transfer, from high bed; pt with some hip elevation and transfer of wt onto LLE, but unable to then pivot L foot .    BEd> w/c SB transfer to L with set-up assist for w/c; pt placed board correctly after 1 prompt.     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

## 2011-12-30 NOTE — Progress Notes (Signed)
Occupational Therapy Note  Patient Details  Name: Faith [name] MRN: [id number] Date of Birth: 1955-11-23 Today's Date: 12/30/2011  Time:  1530-1600  (30 mins) Pain:  None Engaged in transfers from wc to bed and scooting on bed.  Pt used sliding board to go to bed with minimal assist.  Encouraged pt to transfer to the dropped arm commode chair but she was fatigued.  Pt. Scooted to head of bed with supervision.  Pt. Was mod assist to get legs up on bed.  She stated she would try the dropped arm commode chair on Saturday.     Humberto Seals 12/30/2011, 3:51 PM

## 2011-12-30 NOTE — Progress Notes (Signed)
Occupational Therapy Session Note  Patient Details  Name: Faith [name] MRN: [id number] Date of Birth: Nov 23, 1955  Today's Date: 12/30/2011 Time: 0900-1000 Time Calculation (min): 60 min  Short Term Goals: Week 1:  OT Short Term Goal 1 (Week 1): Pt will increase UB dressing to Supervision. OT Short Term Goal 1 - Progress (Week 1): Progressing toward goal OT Short Term Goal 2 (Week 1): Pt will increase bathing to Mod Assist. (LB at bed level. UB at sink). OT Short Term Goal 2 - Progress (Week 1): Met OT Short Term Goal 3 (Week 1): Pt will increase LB dressing to Mod Assist at bed level. OT Short Term Goal 3 - Progress (Week 1): Progressing toward goal OT Short Term Goal 4 (Week 1): Pt will increase toileting to Max Assist. OT Short Term Goal 4 - Progress (Week 1): Met OT Short Term Goal 5 (Week 1): Pt will increase toilet transfer to Max Assist with slideboard. OT Short Term Goal 5 - Progress (Week 1): Met  Skilled Therapeutic Interventions/Progress Updates:    Pt seen for ADL retraining of B/D from EOB with a focus on lateral leans and dynamic sitting balance.  Pt attempted to reach forward from sitting on EOB to don pants over left foot and also attempted to cross left leg over right, but it was not safe as her balance was unstable.  She required assist, but could probably don over foot if she lifted foot on the bed.  Spoke with pt's husband via phone regarding need for drop arm BSC.  He stated that he is having the doors widened to the bathroom for w/c access.  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

## 2011-12-30 NOTE — Progress Notes (Signed)
Speech Language Pathology Daily Session Note  Patient Details  Name: Faith [name] MRN: [id number] Date of Birth: 07-Jun-1955  Today's Date: 12/30/2011 Time: 4098-1191 Time Calculation (min): 55 min  Short Term Goals: Week 2: SLP Short Term Goal 1 (Week 2): Patient will utilize external aids to facilitate increased recall of new information with Min A visual and verbal cuing. SLP Short Term Goal 2 (Week 2): Patient will demonstrate improved anticipatory awareness during discharge discussions with Min A verbal cuing.  SLP Short Term Goal 3 (Week 2): Patient will solve basic problems during functional tasks with Supervision visual and verbal cuing.  Skilled Therapeutic Interventions: Treatment session focused on addressing cognitive goals during a self-care task. Friend present for session and SLP facilitated session with meal planning task using menu and handout from dietician regarding renal diet recommendations. Patient required mod faded to min assist verbal and visual cues utilize handout during task. SLP also facilitated session with mod assist verbal cues to organize and initiate use of memory log.   FIM:  Comprehension Comprehension Mode: Auditory Comprehension: 5-Follows basic conversation/direction: With extra time/assistive device Expression Expression Mode: Verbal Expression: 5-Expresses basic needs/ideas: With extra time/assistive device Social Interaction Social Interaction: 6-Interacts appropriately with others with medication or extra time (anti-anxiety, antidepressant). Problem Solving Problem Solving: 5-Solves basic 90% of the time/requires cueing < 10% of the time Memory Memory: 4-Recognizes or recalls 75 - 89% of the time/requires cueing 10 - 24% of the time FIM - Eating Eating Activity: 6: Assistive device: dentures  Pain Pain Assessment Pain Assessment: No/denies pain  Therapy/Group: Individual Therapy  Charlane Ferretti.,  CCC-SLP [phone]  Faith [name] 12/30/2011, 4:58 PM

## 2011-12-30 NOTE — Progress Notes (Signed)
Patient ID: Faith [name], female   DOB: 01-17-1956, 56 y.o.   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 Lajoyce Corners. Post op started on coumadin for VTE prophylaxis. KI ordered to help keep knee in extension.  ESRD on HD  Subjective/Complaints: Knee immobilizer off in ams.  Pt denies removing it  Slept very well. No pain this am. Very alert. Sitting EOB.       Objective: Vital Signs: Blood pressure 128/69, pulse 80, temperature 98.7 F (37.1 C), temperature source Oral, resp. rate 19, weight 70.4 kg (155 lb 3.3 oz), SpO2 95.00%. No results found. Results for orders placed during the hospital encounter of 12/15/11 (from the past 72 hour(s))  GLUCOSE, CAPILLARY     Status: Abnormal   Collection Time   12/27/11 11:32 AM      Component Value Range Comment   Glucose-Capillary 114 (*) 70 - 99 mg/dL    Comment 1 Notify RN     GLUCOSE, CAPILLARY     Status: Abnormal   Collection Time   12/27/11  4:59 PM      Component Value Range Comment   Glucose-Capillary 132 (*) 70 - 99 mg/dL   GLUCOSE, CAPILLARY     Status: Abnormal   Collection Time   12/27/11  8:59 PM      Component Value Range Comment   Glucose-Capillary 65 (*) 70 - 99 mg/dL   GLUCOSE, CAPILLARY     Status: Normal   Collection Time   12/27/11  9:54 PM      Component Value Range Comment   Glucose-Capillary 76  70 - 99 mg/dL   GLUCOSE, CAPILLARY     Status: Abnormal   Collection Time   12/27/11 10:45 PM      Component Value Range Comment   Glucose-Capillary 101 (*) 70 - 99 mg/dL   PROTIME-INR     Status: Abnormal   Collection Time   12/28/11  5:00 AM      Component Value Range Comment   Prothrombin Time 18.4 (*) 11.6 - 15.2 seconds    INR 1.58 (*) 0.00 - 1.49   GLUCOSE, CAPILLARY     Status:  Normal   Collection Time   12/28/11  7:59 AM      Component Value Range Comment   Glucose-Capillary 85  70 - 99 mg/dL    Comment 1 Notify RN     GLUCOSE, CAPILLARY     Status: Abnormal   Collection Time   12/28/11 11:31 AM      Component Value Range Comment   Glucose-Capillary 144 (*) 70 - 99 mg/dL    Comment 1 Notify RN     RENAL FUNCTION PANEL     Status: Abnormal   Collection Time   12/28/11  5:08 PM      Component Value Range Comment   Sodium 130 (*) 135 - 145 mEq/L    Potassium 4.4  3.5 - 5.1 mEq/L    Chloride 90 (*) 96 - 112 mEq/L    CO2 28  19 - 32 mEq/L    Glucose, Bld 90  70 - 99 mg/dL    BUN 35 (*) 6 - 23 mg/dL    Creatinine, Ser 1.32 (*) 0.50 - 1.10 mg/dL    Calcium 44.0 (*) 8.4 - 10.5  mg/dL    Phosphorus 3.4  2.3 - 4.6 mg/dL    Albumin 2.8 (*) 3.5 - 5.2 g/dL    GFR calc non Af Amer 8 (*) >90 mL/min    GFR calc Af Amer 9 (*) >90 mL/min   CBC     Status: Abnormal   Collection Time   12/28/11  5:08 PM      Component Value Range Comment   WBC 8.0  4.0 - 10.5 K/uL    RBC 3.70 (*) 3.87 - 5.11 MIL/uL    Hemoglobin 9.2 (*) 12.0 - 15.0 g/dL    HCT 16.1 (*) 09.6 - 46.0 %    MCV 80.3  78.0 - 100.0 fL    MCH 24.9 (*) 26.0 - 34.0 pg    MCHC 31.0  30.0 - 36.0 g/dL    RDW 04.5 (*) 40.9 - 15.5 %    Platelets 431 (*) 150 - 400 K/uL   GLUCOSE, CAPILLARY     Status: Normal   Collection Time   12/28/11  9:28 PM      Component Value Range Comment   Glucose-Capillary 86  70 - 99 mg/dL    Comment 1 Notify RN     PROTIME-INR     Status: Abnormal   Collection Time   12/29/11  7:05 AM      Component Value Range Comment   Prothrombin Time 17.7 (*) 11.6 - 15.2 seconds    INR 1.50 (*) 0.00 - 1.49   GLUCOSE, CAPILLARY     Status: Abnormal   Collection Time   12/29/11  7:30 AM      Component Value Range Comment   Glucose-Capillary 36 (*) 70 - 99 mg/dL    Comment 1 Notify RN      Comment 2 Repeat Test     GLUCOSE, CAPILLARY     Status: Abnormal   Collection Time   12/29/11  7:31 AM        Component Value Range Comment   Glucose-Capillary 36 (*) 70 - 99 mg/dL    Comment 1 Notify RN      Comment 2 Repeat Test     GLUCOSE, CAPILLARY     Status: Abnormal   Collection Time   12/29/11  7:33 AM      Component Value Range Comment   Glucose-Capillary 37 (*) 70 - 99 mg/dL    Comment 1 Notify RN     GLUCOSE, CAPILLARY     Status: Normal   Collection Time   12/29/11  8:04 AM      Component Value Range Comment   Glucose-Capillary 75  70 - 99 mg/dL    Comment 1 Notify RN     GLUCOSE, CAPILLARY     Status: Abnormal   Collection Time   12/29/11 11:30 AM      Component Value Range Comment   Glucose-Capillary 143 (*) 70 - 99 mg/dL    Comment 1 Notify RN     GLUCOSE, CAPILLARY     Status: Abnormal   Collection Time   12/29/11  4:43 PM      Component Value Range Comment   Glucose-Capillary 109 (*) 70 - 99 mg/dL   GLUCOSE, CAPILLARY     Status: Normal   Collection Time   12/29/11  9:06 PM      Component Value Range Comment   Glucose-Capillary 85  70 - 99 mg/dL   GLUCOSE, CAPILLARY     Status: Normal   Collection Time  12/30/11  7:29 AM      Component Value Range Comment   Glucose-Capillary 94  70 - 99 mg/dL    Comment 1 Notify RN        HEENT: normal Cardio: RRR Resp: Rales GI: BS positive and Distention Extremity:  No Edema Skin:   Other R BKA with dressing and immobilizer- no substantial drainage Neuro: Alert/Oriented, Abnormal Sensory absent proprioception, absent lt touch L foot, Abnormal Motor 2-/5 R ankle DF/PF, Dysarthric and Other tardive dyskinesia lip smacking Musc/Skel:  Other R BKA,  Gen: NAD-very alert Skin: small ulcer L 4th toe 3mm no erythema or drainage, dry ulcer 2mm tip of L great toe   Assessment/Plan: 1. Functional deficits secondary to R BKA, DM with neuropathy which require 3+ hours per day of interdisciplinary therapy in a comprehensive inpatient rehab setting. Physiatrist is providing close team supervision and 24 hour management of active  medical problems listed below. Physiatrist and rehab team continue to assess barriers to discharge/monitor patient progress toward functional and medical goals. FIM: FIM - Bathing Bathing Steps Patient Completed: Chest;Right Arm;Left Arm;Abdomen;Front perineal area;Right upper leg;Left upper leg;Buttocks;Left lower leg (including foot) Bathing: 4: Min-Patient completes 8-9 51f 10 parts or 75+ percent  FIM - Upper Body Dressing/Undressing Upper body dressing/undressing steps patient completed: Thread/unthread right sleeve of pullover shirt/dresss;Thread/unthread left sleeve of pullover shirt/dress;Put head through opening of pull over shirt/dress;Pull shirt over trunk Upper body dressing/undressing: 5: Supervision: Safety issues/verbal cues FIM - Lower Body Dressing/Undressing Lower body dressing/undressing steps patient completed: Thread/unthread right underwear leg;Thread/unthread left underwear leg;Pull underwear up/down;Thread/unthread right pants leg;Thread/unthread left pants leg;Pull pants up/down;Don/Doff left sock;Don/Doff left shoe Lower body dressing/undressing: 2: Max-Patient completed 25-49% of tasks  FIM - Toileting Toileting steps completed by patient: Performs perineal hygiene Toileting Assistive Devices: Grab bar or rail for support Toileting: 0: Activity did not occur  FIM - Diplomatic Services operational officer Devices: Therapist, music Transfers: 0-Activity did not occur  FIM - Architectural technologist Transfer: 1: Two helpers  FIM - Locomotion: Wheelchair Distance: 150 Locomotion: Wheelchair: 2: Travels 50 - 149 ft with supervision, cueing or coaxing FIM - Locomotion: Ambulation Locomotion: Ambulation: 0: Activity did not occur  Comprehension Comprehension Mode: Auditory Comprehension: 5-Follows basic conversation/direction: With extra time/assistive device  Expression Expression Mode:  Verbal Expression: 5-Expresses basic needs/ideas: With extra time/assistive device  Social Interaction Social Interaction Mode: Asleep Social Interaction: 6-Interacts appropriately with others with medication or extra time (anti-anxiety, antidepressant).  Problem Solving Problem Solving Mode: Asleep Problem Solving: 5-Solves complex 90% of the time/cues < 10% of the time  Memory Memory Mode: Asleep Memory: 3-Recognizes or recalls 50 - 74% of the time/requires cueing 25 - 49% of the time  Medical Problem List and Plan:  1. DVT Prophylaxis/Anticoagulation: Pharmaceutical: Coumadin  2. Pain Management: monitor on prn Medication for now.Reduced gabapentin to 300mg --much more alert,  phantom pain worse start nortriptyline 3. Mood: difficult to judge. Will monitor for now and have LCSW follow for evaluation.  4. Neuropsych: This patient is capable of making decisions on his/her own behalf.  5. 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 SSI for elevated BS. --reasonable control at present, no hypoglycemic events since lantus d/c ed 6. ESRD: HD M, W, F. Daily weights. Renal diet.  7. Low grade fevers: resolved monitor 8.  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  Multiple small meals 9.  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  PVD small ischemic ulcers chronic appearing will monitor LOS (Days) 15 A FACE TO FACE EVALUATION WAS PERFORMED  KIRSTEINS,ANDREW E 12/30/2011, 9:29 AM

## 2011-12-31 ENCOUNTER — Inpatient Hospital Stay (HOSPITAL_COMMUNITY): Payer: Medicare (Managed Care) | Admitting: Physical Therapy

## 2011-12-31 LAB — PROTIME-INR: INR: 1.55 — ABNORMAL HIGH (ref 0.00–1.49)

## 2011-12-31 LAB — GLUCOSE, CAPILLARY
Glucose-Capillary: 71 mg/dL (ref 70–99)
Glucose-Capillary: 52 mg/dL — ABNORMAL LOW (ref 70–99)

## 2011-12-31 MED ORDER — WARFARIN SODIUM 3 MG PO TABS
3.0000 mg | ORAL_TABLET | Freq: Once | ORAL | Status: AC
  Administered 2011-12-31: 3 mg via ORAL
  Filled 2011-12-31: qty 1

## 2011-12-31 NOTE — Progress Notes (Signed)
Subjective:  Constipated, getting an enema Objective Vital signs in last 24 hours: Filed Vitals:   12/31/11 0100 12/31/11 0130 12/31/11 0203 12/31/11 0700  BP: 119/61 124/59 119/58 128/76  Pulse: 81 81 76 72  Temp:  98.9 F (37.2 C) 98.2 F (36.8 C) 97.5 F (36.4 C)  TempSrc:  Oral Oral Oral  Resp:  18 18 19   Weight:  71.8 kg (158 lb 4.6 oz)  71.2 kg (156 lb 15.5 oz)  SpO2:  95% 100% 98%   Weight change: 2.9 kg (6 lb 6.3 oz)  Intake/Output Summary (Last 24 hours) at 12/31/11 1230 Last data filed at 12/31/11 0130  Gross per 24 hour  Intake    240 ml  Output   1500 ml  Net  -1260 ml   Labs: Basic Metabolic Panel:  Lab 12/30/11 0981 12/28/11 1708 12/26/11 1439  NA 127* 130* 127*  K 4.1 4.4 4.5  CL 87* 90* 87*  CO2 27 28 27   GLUCOSE 118* 90 130*  BUN 27* 35* 36*  CREATININE 4.88* 5.37* 6.41*  CALCIUM 10.8* 11.2* 10.7*  ALB -- -- --  PHOS 3.2 3.4 3.8   Liver Function Tests:  Lab 12/30/11 2051 12/28/11 1708 12/26/11 1439  AST -- -- --  ALT -- -- --  ALKPHOS -- -- --  BILITOT -- -- --  PROT -- -- --  ALBUMIN 2.8* 2.8* 2.7*   No results found for this basename: LIPASE:3,AMYLASE:3 in the last 168 hours No results found for this basename: AMMONIA:3 in the last 168 hours CBC:  Lab 12/30/11 2051 12/28/11 1708 12/26/11 1450 12/26/11 0730  WBC 6.8 8.0 8.9 --  NEUTROABS -- -- -- --  HGB 9.8* 9.2* 9.2* --  HCT 32.0* 29.7* 29.6* --  MCV 79.2 80.3 78.5 79.4  PLT 369 431* 435* --   Cardiac Enzymes: No results found for this basename: CKTOTAL:5,CKMB:5,CKMBINDEX:5,TROPONINI:5 in the last 168 hours CBG:  Lab 12/31/11 1140 12/31/11 0801 12/31/11 0744 12/31/11 0200 12/30/11 2155  GLUCAP 124* 106* 68* 71 98    Iron Studies: No results found for this basename: IRON,TIBC,TRANSFERRIN,FERRITIN in the last 72 hours Studies/Results: No results found. Medications:      . aspirin EC  81 mg Oral Daily  . cinacalcet  30 mg Oral Daily  . cinacalcet  90 mg Oral QPM  .  darbepoetin (ARANESP) injection - DIALYSIS  25 mcg Intravenous Q Wed-HD  . ezetimibe  10 mg Oral Daily  . feeding supplement (NEPRO CARB STEADY)  237 mL Oral BID BM  . feeding supplement  1 Container Oral Q24H  . fenofibrate  54 mg Oral Daily  . gabapentin  300 mg Oral QHS  . insulin aspart  0-9 Units Subcutaneous TID WC  . insulin aspart  3 Units Subcutaneous TID WC  . multivitamin  1 tablet Oral QHS  . Muscle Rub   Topical TID  . pantoprazole  40 mg Oral Q1200  . paricalcitol  8 mcg Intravenous Q M,W,F-HD  . senna-docusate  1 tablet Oral BID  . sevelamer  2,400 mg Oral TID WC  . traMADol  50 mg Oral TID AC  . [COMPLETED] warfarin  3 mg Oral Once  . Warfarin - Pharmacist Dosing Inpatient   Does not apply q1800   I  have reviewed scheduled and prn medications.  Physical Exam: General: Alert, NAD Heart: RRR,Soft 1/6 sem lsb , no rub Lungs: CTA bilaterally no rales or wheezes Abdomen:  Soft , nointrender Extremities: Dialysis  Access: no pedal edema left, R BKA, pos. Bruit RUA AVG   Dialysis Orders: Center: [facility] on MWF Optiflux 180  EDW 78.5 HD Bath 2K/2.0Ca bath Time3.5 Heparin 7000. Access right upper AVGG BFR 500 DFR 800 Zemplar 18 mcg IV/HD Epogen 1000 Units IV/HD Venofer none Other profile 4; ferritins consistently in the 2000s and tsats in the 20s   Assessment/Plan:  1. 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 Lajoyce Corners on 10/21; rehab in progress; on Coumadin per Ortho for DVT prophylaxis (1 mg qd for 1 month). 2. ESRD, cont hd mwf 3. Anemia - Hgb 9.2 on Aranesp 25 mcg on Wed. 4. HTN/Volume - BP 128/69, no BP meds. New dry weight 70-71 kg (was 78.5kg). 5. HPTH- Zemplar d/c'd due to high Ca, cont Sensipar 90 mg qd, Renvela 3 with meals. 6. Nutrition - Alb 2.8, on high protein renal diet; added Prostat bid. 7. DM - per primary. 8. Hx CVA - with LUE weakness. 9. Dispo - tentative dc date 11/13 per CIR, will need HHC to be set up prior to dc.  Vinson Moselle  MD [facility] [phone] pgr    [phone] cell 12/31/2011, 12:33 PM

## 2011-12-31 NOTE — Progress Notes (Signed)
Orthopedic Tech Progress Note Patient Details:  Faith [name] [birth year] [id number]  Patient ID: Faith [name], female   DOB: December 12, 1955, 56 y.o.   MRN: [id number] Adjusted ki to fit properly ;PA Love notified  Nikki Dom 12/31/2011, 1:11 PM

## 2011-12-31 NOTE — Progress Notes (Signed)
Patient ID: Faith [name], female   DOB: 1955/02/22, 56 y.o.   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 Lajoyce Corners. Post op started on coumadin for VTE prophylaxis. KI ordered to help keep knee in extension.  ESRD on HD  Subjective/Complaints: Knee immobilizer off in ams.  Pt denies removing it  Slept very well. No pain this am. Very alert. Sitting EOB.       Objective: Vital Signs: Blood pressure 128/76, pulse 72, temperature 97.5 F (36.4 C), temperature source Oral, resp. rate 19, weight 156 lb 15.5 oz (71.2 kg), SpO2 98.00%.  No acute distress. Chest clear to auscultation cardiac exam S1-S2 are regular. Abdominal exam active bowel sounds, soft. Extremities status post right BKA   Assessment/Plan:  Medical Problem List and Plan:  1. DVT Prophylaxis/Anticoagulation: Pharmaceutical: Coumadin  2. Pain Management: monitor on prn Medication for now.Reduced gabapentin to 300mg --much more alert,  3. Mood: difficult to judge. Will monitor for now and have LCSW follow for evaluation.  4. Neuropsych: This patient is capable of making decisions on his/her own behalf.  5. 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 SSI for elevated BS. --reasonable control at present, no hypoglycemic events since lantus d/c ed 6. ESRD: HD M, W, F. Daily weights. Renal diet. --Note hyponatremia, to be followed by nephrology. 7. Low grade fevers: resolved monitor 8.  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  Multiple small meals 9.  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  PVD small ischemic ulcers chronic appearing will monitor LOS (Days) 16 A FACE TO FACE EVALUATION WAS  PERFORMED  SWORDS,BRUCE HENRY 12/31/2011, 9:54 AM

## 2011-12-31 NOTE — Progress Notes (Signed)
Received report from Kristin,rn in HD and they will be bringing pt back shortly.Pt tolerated treatment well.They will return with pt shortly.wbb

## 2011-12-31 NOTE — Progress Notes (Signed)
ANTICOAGULATION CONSULT NOTE - Follow Up Consult  Pharmacy Consult:  Coumadin Indication: VTE prophylaxis  Allergies  Allergen Reactions  . Metoclopramide Hcl Other (See Comments)    Tardive dyskinesia  . Codeine Nausea And Vomiting  . Lipitor (Atorvastatin Calcium) Other (See Comments)    Muscle weakness    Patient Measurements: Weight: 156 lb 15.5 oz (71.2 kg)  Vital Signs: Temp: 97.5 F (36.4 C) (11/09 0700) Temp src: Oral (11/09 0700) BP: 128/76 mmHg (11/09 0700) Pulse Rate: 72  (11/09 0700)  Labs:  Alvira Philips 12/31/11 0640 12/30/11 2051 12/29/11 0705 12/28/11 1708  HGB -- 9.8* -- 9.2*  HCT -- 32.0* -- 29.7*  PLT -- 369 -- 431*  APTT -- -- -- --  LABPROT 18.1* 17.7* 17.7* --  INR 1.55* 1.50* 1.50* --  HEPARINUNFRC -- -- -- --  CREATININE -- 4.88* -- 5.37*  CKTOTAL -- -- -- --  CKMB -- -- -- --  TROPONINI -- -- -- --    The CrCl is unknown because both a height and weight (above a minimum accepted value) are required for this calculation.  Assessment: 56 y/o female patient on Coumadin for VTE prophylaxis s/p BKA for one month. Patient did have elevated baseline INR.   Anticoagulation: Coumadin for DVT px s/p BKA. INR up to 1.55.  Cardiovascular: VSS - ASA 81mg , Zetia, fenofibrate  Endocrinology:  inconsistent PO intake. CBGs 68-124 on SSI  Gastrointestinal / Nutrition: Regular diet. Protonix, MV  Neurology: pain score 4-6 on gabapentin, reduced dose due to decreased alertness, may need nortriptyline for phantom pain  Nephrology: ESRD, HD MWF. On Sensipar, Zemplar, sevelamer and Aranesp on Wed.. Hgb 9.8  Hematology / Oncology: hgb 9.8, iron level low at 17, on Aranesp q-Wed. Ferritin elevated so hold off on recommending to supplement iron. Plts ok  Best Practices: Coumadin, home meds addressed .   Goal of Therapy:  INR 2-3   Plan:  Repeat Coumadin 3mg  po x 1 again tonight.    Jesseca Marsch S. Merilynn Finland, PharmD, [facility] Clinical Staff  Pharmacist Pager [phone]   12/31/2011 1:24 PM

## 2011-12-31 NOTE — Progress Notes (Signed)
Physical Therapy Session Note  Patient Details  Name: Faith [name] MRN: [id number] Date of Birth: March 04, 1955  Today's Date: 12/31/2011 Time: 1400-1500 Total Time: 60 minutes  Short Term Goals: Week 1:  PT Short Term Goal 1 (Week 1): pt will move supine > sit with mod assist in flat bed without rails PT Short Term Goal 1 - Progress (Week 1): Met PT Short Term Goal 2 (Week 1): pt will transfer bed>< w/c with mod assist PT Short Term Goal 2 - Progress (Week 1): Met PT Short Term Goal 3 (Week 1): pt will propel w/c x 70' with supervision PT Short Term Goal 3 - Progress (Week 1): Met PT Short Term Goal 4 (Week 1): pt will manipulate w/c parts for transfer with mod assist PT Short Term Goal 4 - Progress (Week 1): Met  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      Left LE with 2# ankle weight on thigh for hip flexion. AAROM Left LAQ with eccentric quad for lowering leg Reviewed importance of R BKA knee extension ROM. PROM R knee lacking 10 degrees into terminal extension  See FIM for current functional status  Therapy/Group: Group Therapy  Javeion Cannedy J 12/31/2011, 6:58 PM

## 2011-12-31 NOTE — Progress Notes (Signed)
Nursing Note: Pt to Hemodialysis.wbb

## 2012-01-01 ENCOUNTER — Inpatient Hospital Stay (HOSPITAL_COMMUNITY): Payer: Medicare (Managed Care)

## 2012-01-01 LAB — GLUCOSE, CAPILLARY
Glucose-Capillary: 68 mg/dL — ABNORMAL LOW (ref 70–99)
Glucose-Capillary: 100 mg/dL — ABNORMAL HIGH (ref 70–99)
Glucose-Capillary: 71 mg/dL (ref 70–99)

## 2012-01-01 LAB — PROTIME-INR: Prothrombin Time: 18.9 seconds — ABNORMAL HIGH (ref 11.6–15.2)

## 2012-01-01 MED ORDER — HEPARIN SODIUM (PORCINE) 1000 UNIT/ML DIALYSIS
20.0000 [IU]/kg | INTRAMUSCULAR | Status: DC | PRN
  Filled 2012-01-01: qty 2

## 2012-01-01 MED ORDER — WARFARIN SODIUM 3 MG PO TABS
3.0000 mg | ORAL_TABLET | Freq: Once | ORAL | Status: AC
  Administered 2012-01-01: 3 mg via ORAL
  Filled 2012-01-01: qty 1

## 2012-01-01 NOTE — Progress Notes (Signed)
ANTICOAGULATION CONSULT NOTE - Follow Up Consult  Pharmacy Consult:  Coumadin Indication: VTE prophylaxis  Allergies  Allergen Reactions  . Metoclopramide Hcl Other (See Comments)    Tardive dyskinesia  . Codeine Nausea And Vomiting  . Lipitor (Atorvastatin Calcium) Other (See Comments)    Muscle weakness    Patient Measurements: Weight: 162 lb 4.1 oz (73.6 kg)  Vital Signs: Temp: 98.4 F (36.9 C) (11/10 0600) Temp src: Oral (11/10 0600) BP: 141/82 mmHg (11/10 0600) Pulse Rate: 85  (11/10 0600)  Labs:  Basename 01/01/12 0647 12/31/11 0640 12/30/11 2051  HGB -- -- 9.8*  HCT -- -- 32.0*  PLT -- -- 369  APTT -- -- --  LABPROT 18.9* 18.1* 17.7*  INR 1.64* 1.55* 1.50*  HEPARINUNFRC -- -- --  CREATININE -- -- 4.88*  CKTOTAL -- -- --  CKMB -- -- --  TROPONINI -- -- --    The CrCl is unknown because both a height and weight (above a minimum accepted value) are required for this calculation.  Assessment: 56 y/o female patient on Coumadin for VTE prophylaxis s/p BKA for one month. Patient did have elevated baseline INR.   Anticoagulation: Coumadin for DVT px s/p BKA. INR up to 1.64.  Cardiovascular: VSS( 141/82, 85) - ASA 81mg , Zetia, fenofibrate  Endocrinology:  inconsistent PO intake. CBGs 52-124 (mostly <100) on SSI  Gastrointestinal / Nutrition: Regular diet. Protonix, MV. May have gastroparesis.  Neurology: pain score 4-6 on gabapentin, reduced dose due to decreased alertness, may need nortriptyline for phantom pain  Nephrology: ESRD, HD MWF. On Sensipar, Zemplar, sevelamer and Aranesp on Wed.. Hgb 9.8  Hematology / Oncology: hgb 9.8, iron level low at 17, on Aranesp q-Wed. Ferritin elevated so hold off on recommending to supplement iron. Plts ok  Best Practices: Coumadin, home meds addressed .   Goal of Therapy:  INR 2-3   Plan:  Repeat Coumadin 3mg  po x 1 again tonight.    Danuel Felicetti S. Merilynn Finland, PharmD, [facility] Clinical Staff Pharmacist Pager  [phone]   01/01/2012 9:35 AM

## 2012-01-01 NOTE — Progress Notes (Signed)
Patient ID: Faith [name], female   DOB: 25-Oct-1955, 56 y.o.   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 Lajoyce Corners. Post op started on coumadin for VTE prophylaxis. KI ordered to help keep knee in extension.  ESRD on HD  Subjective/Complaints:  Patient feels well. No complaints. She denies leg discomfort.       Objective: Vital Signs: Blood pressure 141/82, pulse 85, temperature 98.4 F (36.9 C), temperature source Oral, resp. rate 18, weight 162 lb 4.1 oz (73.6 kg), SpO2 100.00%.  Patient without distress. Chest clear to auscultation cardiac exam S1-S2 are regular. Abdominal exam active bowel sounds, soft.   Assessment/Plan:  Medical Problem List and Plan:  1. DVT Prophylaxis/Anticoagulation: Pharmaceutical: Coumadin  2. Pain Management: Seems to be adequately controlled. 3. Mood: Seems appropriate. 4. Neuropsych: This patient is capable of making decisions on 56his/her own behalf.  5. 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 SSI for elevated BS. --reasonable control  CBG (last 3)   Basename 01/01/12 0812 01/01/12 0757 12/31/11 2101  GLUCAP 72 68* 90     6. ESRD: HD M, W, F. Daily weights. Renal diet. --Note hyponatremia, to be followed by nephrology. 7. Low grade fevers: resolved monitor 8.  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  Multiple small meals 9.  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  PVD small ischemic ulcers chronic appearing will monitor LOS (Days) 17 A FACE TO FACE EVALUATION WAS PERFORMED  Viriginia Amendola HENRY 01/01/2012, 8:50 AM

## 2012-01-01 NOTE — Progress Notes (Signed)
Occupational Therapy Session Note  Patient Details  Name: Faith [name] MRN: [id number] Date of Birth: [birth year]  Today's Date: 01/01/2012 Time: 1016-1100 Time Calculation (min): 44 min  Short Term Goals: Week 2:  OT Short Term Goal 1 (Week 2): STG=LTG  Skilled Therapeutic Interventions:  ADL-retraining sitting at edge of bed for upper body and supine, rolling side to side as needed, for lower body bathing and dressing.   Patient with very good endurance this date, able to sit unsupported while performing upper body ADL at edge of bed.   Patient was minimally distracted by phantom leg pain but would not attempt weight-bearing or sliding board transfer as recommended.       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

## 2012-01-02 ENCOUNTER — Inpatient Hospital Stay (HOSPITAL_COMMUNITY): Payer: Medicare (Managed Care)

## 2012-01-02 ENCOUNTER — Encounter (HOSPITAL_COMMUNITY): Payer: PRIVATE HEALTH INSURANCE | Admitting: Occupational Therapy

## 2012-01-02 ENCOUNTER — Inpatient Hospital Stay (HOSPITAL_COMMUNITY): Payer: Medicare (Managed Care) | Admitting: Speech Pathology

## 2012-01-02 LAB — RENAL FUNCTION PANEL
Albumin: 2.8 g/dL — ABNORMAL LOW (ref 3.5–5.2)
BUN: 27 mg/dL — ABNORMAL HIGH (ref 6–23)
CO2: 27 mEq/L (ref 19–32)
Calcium: 11 mg/dL — ABNORMAL HIGH (ref 8.4–10.5)
Chloride: 86 mEq/L — ABNORMAL LOW (ref 96–112)
Creatinine, Ser: 5.25 mg/dL — ABNORMAL HIGH (ref 0.50–1.10)
GFR calc Af Amer: 10 mL/min — ABNORMAL LOW (ref >90)
GFR calc non Af Amer: 8 mL/min — ABNORMAL LOW (ref >90)
Glucose, Bld: 149 mg/dL — ABNORMAL HIGH (ref 70–99)
Phosphorus: 2.9 mg/dL (ref 2.3–4.6)
Potassium: 4.3 mEq/L (ref 3.5–5.1)
Sodium: 126 mEq/L — ABNORMAL LOW (ref 135–145)

## 2012-01-02 LAB — CBC
HCT: 28.6 % — ABNORMAL LOW (ref 36.0–46.0)
Hemoglobin: 9.1 g/dL — ABNORMAL LOW (ref 12.0–15.0)
MCH: 24.9 pg — ABNORMAL LOW (ref 26.0–34.0)
MCHC: 31.8 g/dL (ref 30.0–36.0)
MCV: 78.4 fL (ref 78.0–100.0)
Platelets: 362 10*3/uL (ref 150–400)
RBC: 3.65 MIL/uL — ABNORMAL LOW (ref 3.87–5.11)
RDW: 17 % — ABNORMAL HIGH (ref 11.5–15.5)
WBC: 7.8 10*3/uL (ref 4.0–10.5)

## 2012-01-02 LAB — GLUCOSE, CAPILLARY
Glucose-Capillary: 81 mg/dL (ref 70–99)
Glucose-Capillary: 71 mg/dL (ref 70–99)

## 2012-01-02 MED ORDER — DARBEPOETIN ALFA-POLYSORBATE 60 MCG/0.3ML IJ SOLN
60.0000 ug | INTRAMUSCULAR | Status: DC
  Administered 2012-01-04: 60 ug via INTRAVENOUS
  Filled 2012-01-02: qty 0.3

## 2012-01-02 MED ORDER — CINACALCET HCL 30 MG PO TABS
120.0000 mg | ORAL_TABLET | ORAL | Status: DC
  Administered 2012-01-02 – 2012-01-04 (×3): 120 mg via ORAL
  Filled 2012-01-02 (×3): qty 4

## 2012-01-02 MED ORDER — CINACALCET HCL 30 MG PO TABS: 30.0000 mg | ORAL_TABLET | Freq: Every day | ORAL | Status: DC

## 2012-01-02 MED ORDER — WARFARIN SODIUM 5 MG PO TABS
5.0000 mg | ORAL_TABLET | Freq: Once | ORAL | Status: AC
  Administered 2012-01-02: 5 mg via ORAL
  Filled 2012-01-02 (×2): qty 1

## 2012-01-02 NOTE — Progress Notes (Signed)
Recreational Therapy Session Note  Patient Details  Name: Faith [name] MRN: [id number] Date of Birth: [birth year] Today's Date: 01/02/2012 Time:  930 Pain: no c/o Skilled Therapeutic Interventions/Progress Updates: Went to see pt for pain management/relaxation training.  Pt asleep in bed upon entry to the room listening to relaxation therapy cd left in room for previous session last week.  Spoke with pt later in the morning during PT session and pt stated how much the music helped to relax her, expressed appreciation for service.  [name] 01/02/2012, 12:13 PM

## 2012-01-02 NOTE — Progress Notes (Signed)
Patient c/o "need to have BM", "feeling full", and "ready to have BM, but can't".  Supp pr given per patient request.  Rectum noted to be full of stool.

## 2012-01-02 NOTE — Progress Notes (Signed)
Speech Language Pathology Daily Session Note  Patient Details  Name: Faith [name] MRN: [id number] Date of Birth: [birth year]  Today's Date: 01/02/2012 Time: 1345-1445 Time Calculation (min): 60 min  Short Term Goals: Week 2: SLP Short Term Goal 1 (Week 2): Patient will utilize external aids to facilitate increased recall of new information with Min A visual and verbal cuing. SLP Short Term Goal 2 (Week 2): Patient will demonstrate improved anticipatory awareness during discharge discussions with Min A verbal cuing.  SLP Short Term Goal 3 (Week 2): Patient will solve basic problems during functional tasks with Supervision visual and verbal cuing.  Skilled Therapeutic Interventions: Treatment session focused on addressing cognition and self care.  SLP facilitated session with min assist verbal cues to utilize menu to order meals for tomorrow.  SLP also facilitated session with supervision vebal cue sto alternate attention between tasks for 15 minutes with supervision level verbal cues.  Patient verbally recalled and then performed slide board transfer with initial supervision verbal cues to recall steps.     FIM:  Comprehension Comprehension Mode: Auditory Comprehension: 5-Follows basic conversation/direction: With no assist Expression Expression Mode: Verbal Expression: 5-Expresses basic needs/ideas: With no assist Social Interaction Social Interaction: 6-Interacts appropriately with others with medication or extra time (anti-anxiety, antidepressant). Problem Solving Problem Solving: 5-Solves complex 90% of the time/cues < 10% of the time Memory Memory: 4-Recognizes or recalls 75 - 89% of the time/requires cueing 10 - 24% of the time FIM - Eating Eating Activity: 6: Assistive device: dentures  Pain Pain Assessment Pain Assessment: No/denies pain  Therapy/Group: Individual Therapy  Charlane Ferretti., CCC-SLP [phone]  [name] 01/02/2012, 4:22 PM

## 2012-01-02 NOTE — Progress Notes (Signed)
[facility]  Subjective:  Awake, alert, hopeful she'll go home on Wed.  Says wheelchair may not fit through doors in her house.   Objective: Vital signs in last 24 hours: Blood pressure 138/74, pulse 81, temperature 98.4 F (36.9 C), temperature source Oral, resp. rate 18, weight 74.7 kg (164 lb 10.9 oz), SpO2 100.00%.    PHYSICAL EXAM General--as above Chest--clear Heart--no rub Abd--nontender Extr--R BKA, clotted AVG L and R forearm, L upper arm,          patent AVG R upper arm  Lab Results:   Lab 12/30/11 2051 12/28/11 1708 12/26/11 1439  NA 127* 130* 127*  K 4.1 4.4 4.5  CL 87* 90* 87*  CO2 27 28 27   BUN 27* 35* 36*  CREATININE 4.88* 5.37* 6.41*  ALB -- -- --  GLUCOSE 118* -- --  CALCIUM 10.8* 11.2* 10.7*  PHOS 3.2 3.4 3.8     Basename 12/30/11 2051  WBC 6.8  HGB 9.8*  HCT 32.0*  PLT 369   I have reviewed scheduled and prn medications.   Assessment/Plan: 1. 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 Lajoyce Corners on 10/21; rehab in progress; on Coumadin per Ortho for DVT prophylaxis (1 mg qd for 1 month). 2. ESRD, cont hd mwf 3. Anemia - Hgb 9.2 on Aranesp 25 mcg on Wed--will increase to 60 mcg/wk. Fe/TIBC 17%, ferritin 2280 on 28 Oct--no IV Fe. 4. HTN/Volume - BP 138/74, no BP meds. New dry weight 70-71 kg (was 78.5kg). 5. HPTH- Zemplar d/c'd due to high Ca, cont Sensipar 90 mg qd, Renvela 3 with meals. Check Ca/phos in HD today 6. Nutrition - Alb 2.8, on high protein renal diet; added Prostat bid. 7. DM - per primary. 8. Hx CVA - with LUE weakness. 9. Dispo - tentative dc date 11/13 per CIR, will need HHC to be set up prior to dc.     LOS: 18 days   Shalia Bartko F 01/02/2012,10:00 AM   .labalb

## 2012-01-02 NOTE — Progress Notes (Signed)
Occupational Therapy Session Note  Patient Details  Name: Faith [name] MRN: [id number] Date of Birth: [birth year]  Today's Date: 01/02/2012 Time: 0730-0830 Time Calculation (min): 60 min  Short Term Goals: STG = LTG's  Skilled Therapeutic Interventions/Progress Updates:    Pt seen today for individual OT treatment session with focus on ADL's, selfcare and transfers for increased independence in these areas in preparation for d/c home with family/caregiver and CNA assist. ADL-retraining sitting at edge of bed for upper body & LE's (upper). Pt supine, rolling side to side as needed, for LE bathing and dressing. Patient with good endurance, required VC's to stay on tasks. Pt overall Min A from bed level to drop arm commode (Mod A transfer back to EOB) with sliding board. Pt unsupported while performing upper body ADL's and eating at edge of bed.  OT Treatment Session #2) 10:30-11:00 am Pt seen for individual OT treatment session with focus on transfer training with sliding board to/from bed-w/c & drop arm commode x3. Overall, pt is Min A from bed level to w/c or drop arm commode with assist placing sliding board. Mod assist from drop arm commode or w/c to bed secondary to "uphill" transfer. Pt performed UE strengthening for propelling w/c in hallway with Mod I from room to Day room given increased time and 1 rest break. Assess STG's next visit & begin discharge planning.  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

## 2012-01-02 NOTE — Progress Notes (Signed)
Patient ID: Faith [name], female   DOB: Jan 16, 1956, 56 y.o.   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 Lajoyce Corners. Post op started on coumadin for VTE prophylaxis. KI ordered to help keep knee in extension.  ESRD on HD  Subjective/Complaints: Threw up her snack last night.   Slept very well. No pain this am.  Very alert Stump and phantom limb pain Objective: Vital Signs: Blood pressure 138/74, pulse 81, temperature 98.4 F (36.9 C), temperature source Oral, resp. rate 18, weight 74.7 kg (164 lb 10.9 oz), SpO2 100.00%. No results found. Results for orders placed during the hospital encounter of 12/15/11 (from the past 72 hour(s))  GLUCOSE, CAPILLARY     Status: Abnormal   Collection Time   12/30/11 11:18 AM      Component Value Range Comment   Glucose-Capillary 111 (*) 70 - 99 mg/dL   GLUCOSE, CAPILLARY     Status: Abnormal   Collection Time   12/30/11  4:33 PM      Component Value Range Comment   Glucose-Capillary 124 (*) 70 - 99 mg/dL   CBC     Status: Abnormal   Collection Time   12/30/11  8:51 PM      Component Value Range Comment   WBC 6.8  4.0 - 10.5 K/uL    RBC 4.04  3.87 - 5.11 MIL/uL    Hemoglobin 9.8 (*) 12.0 - 15.0 g/dL    HCT 40.9 (*) 81.1 - 46.0 %    MCV 79.2  78.0 - 100.0 fL    MCH 24.3 (*) 26.0 - 34.0 pg    MCHC 30.6  30.0 - 36.0 g/dL    RDW 91.4 (*) 78.2 - 15.5 %    Platelets 369  150 - 400 K/uL   RENAL FUNCTION PANEL     Status: Abnormal   Collection Time   12/30/11  8:51 PM      Component Value Range Comment   Sodium 127 (*) 135 - 145 mEq/L    Potassium 4.1  3.5 - 5.1 mEq/L    Chloride 87 (*) 96 - 112 mEq/L    CO2 27  19 - 32 mEq/L    Glucose, Bld 118 (*) 70 - 99 mg/dL    BUN 27 (*) 6 - 23 mg/dL    Creatinine, Ser 9.56  (*) 0.50 - 1.10 mg/dL    Calcium 21.3 (*) 8.4 - 10.5 mg/dL    Phosphorus 3.2  2.3 - 4.6 mg/dL    Albumin 2.8 (*) 3.5 - 5.2 g/dL    GFR calc non Af Amer 9 (*) >90 mL/min    GFR calc Af Amer 10 (*) >90 mL/min   PROTIME-INR     Status: Abnormal   Collection Time   12/30/11  8:51 PM      Component Value Range Comment   Prothrombin Time 17.7 (*) 11.6 - 15.2 seconds    INR 1.50 (*) 0.00 - 1.49   GLUCOSE, CAPILLARY     Status: Abnormal   Collection Time   12/30/11  9:09 PM      Component Value Range Comment   Glucose-Capillary 122 (*) 70 - 99 mg/dL   GLUCOSE, CAPILLARY     Status: Normal   Collection Time   12/30/11  9:55 PM  Component Value Range Comment   Glucose-Capillary 98  70 - 99 mg/dL   GLUCOSE, CAPILLARY     Status: Normal   Collection Time   12/31/11  2:00 AM      Component Value Range Comment   Glucose-Capillary 71  70 - 99 mg/dL    Comment 1 Notify RN     PROTIME-INR     Status: Abnormal   Collection Time   12/31/11  6:40 AM      Component Value Range Comment   Prothrombin Time 18.1 (*) 11.6 - 15.2 seconds    INR 1.55 (*) 0.00 - 1.49   GLUCOSE, CAPILLARY     Status: Abnormal   Collection Time   12/31/11  7:44 AM      Component Value Range Comment   Glucose-Capillary 68 (*) 70 - 99 mg/dL    Comment 1 Notify RN     GLUCOSE, CAPILLARY     Status: Abnormal   Collection Time   12/31/11  8:01 AM      Component Value Range Comment   Glucose-Capillary 106 (*) 70 - 99 mg/dL    Comment 1 Notify RN     GLUCOSE, CAPILLARY     Status: Abnormal   Collection Time   12/31/11 11:40 AM      Component Value Range Comment   Glucose-Capillary 124 (*) 70 - 99 mg/dL    Comment 1 Notify RN     GLUCOSE, CAPILLARY     Status: Abnormal   Collection Time   12/31/11  4:32 PM      Component Value Range Comment   Glucose-Capillary 52 (*) 70 - 99 mg/dL    Comment 1 Notify RN     GLUCOSE, CAPILLARY     Status: Abnormal   Collection Time   12/31/11  5:10 PM      Component Value Range  Comment   Glucose-Capillary 67 (*) 70 - 99 mg/dL    Comment 1 Notify RN     GLUCOSE, CAPILLARY     Status: Normal   Collection Time   12/31/11  5:31 PM      Component Value Range Comment   Glucose-Capillary 71  70 - 99 mg/dL    Comment 1 Notify RN     GLUCOSE, CAPILLARY     Status: Normal   Collection Time   12/31/11  9:01 PM      Component Value Range Comment   Glucose-Capillary 90  70 - 99 mg/dL    Comment 1 Notify RN     PROTIME-INR     Status: Abnormal   Collection Time   01/01/12  6:47 AM      Component Value Range Comment   Prothrombin Time 18.9 (*) 11.6 - 15.2 seconds    INR 1.64 (*) 0.00 - 1.49   GLUCOSE, CAPILLARY     Status: Abnormal   Collection Time   01/01/12  7:57 AM      Component Value Range Comment   Glucose-Capillary 68 (*) 70 - 99 mg/dL    Comment 1 Notify RN     GLUCOSE, CAPILLARY     Status: Normal   Collection Time   01/01/12  8:12 AM      Component Value Range Comment   Glucose-Capillary 72  70 - 99 mg/dL    Comment 1 Notify RN     GLUCOSE, CAPILLARY     Status: Abnormal   Collection Time   01/01/12 11:50 AM  Component Value Range Comment   Glucose-Capillary 100 (*) 70 - 99 mg/dL    Comment 1 Notify RN     GLUCOSE, CAPILLARY     Status: Abnormal   Collection Time   01/01/12  4:26 PM      Component Value Range Comment   Glucose-Capillary 115 (*) 70 - 99 mg/dL    Comment 1 Notify RN     GLUCOSE, CAPILLARY     Status: Normal   Collection Time   01/01/12  9:08 PM      Component Value Range Comment   Glucose-Capillary 81  70 - 99 mg/dL    Comment 1 Notify RN     GLUCOSE, CAPILLARY     Status: Normal   Collection Time   01/01/12 11:13 PM      Component Value Range Comment   Glucose-Capillary 71  70 - 99 mg/dL    Comment 1 Notify RN     GLUCOSE, CAPILLARY     Status: Normal   Collection Time   01/02/12  1:59 AM      Component Value Range Comment   Glucose-Capillary 81  70 - 99 mg/dL    Comment 1 Notify RN     GLUCOSE, CAPILLARY      Status: Normal   Collection Time   01/02/12  6:22 AM      Component Value Range Comment   Glucose-Capillary 70  70 - 99 mg/dL   PROTIME-INR     Status: Abnormal   Collection Time   01/02/12  6:30 AM      Component Value Range Comment   Prothrombin Time 18.1 (*) 11.6 - 15.2 seconds    INR 1.55 (*) 0.00 - 1.49   GLUCOSE, CAPILLARY     Status: Abnormal   Collection Time   01/02/12  8:00 AM      Component Value Range Comment   Glucose-Capillary 106 (*) 70 - 99 mg/dL    Comment 1 Notify RN        HEENT: normal Cardio: RRR Resp: Rales GI: BS positive and Distention Extremity:  No Edema Neuro: Alert/Oriented, Abnormal Sensory absent proprioception, absent lt touch L foot, Abnormal Motor 2-/5 R ankle DF/PF, Dysarthric and Other tardive dyskinesia lip smacking Musc/Skel:  Other R BKA,  Gen: NAD-very alert Skin: small ulcer L 4th toe 3mm no erythema or drainage, dry ulcer 2mm tip of L great toe. R-BKA with staples intact. Dry scab midline--no drainage.  No tenderness.   Assessment/Plan: 1. Functional deficits secondary to R BKA, DM with neuropathy which require 3+ hours per day of interdisciplinary therapy in a comprehensive inpatient rehab setting. Physiatrist is providing close team supervision and 24 hour management of active medical problems listed below. Physiatrist and rehab team continue to assess barriers to discharge/monitor patient progress toward functional and medical goals. FIM: FIM - Bathing Bathing Steps Patient Completed: Chest;Right Arm;Left Arm;Abdomen;Front perineal area;Right upper leg;Left upper leg Bathing: 3: Mod-Patient completes 5-7 80f 10 parts or 50-74%  FIM - Upper Body Dressing/Undressing Upper body dressing/undressing steps patient completed: Thread/unthread right sleeve of pullover shirt/dresss;Thread/unthread left sleeve of pullover shirt/dress;Put head through opening of pull over shirt/dress;Pull shirt over trunk Upper body dressing/undressing: 5:  Set-up assist to: Obtain clothing/put away FIM - Lower Body Dressing/Undressing Lower body dressing/undressing steps patient completed: Pull underwear up/down;Pull pants up/down Lower body dressing/undressing: 2: Max-Patient completed 25-49% of tasks  FIM - Toileting Toileting steps completed by patient: Performs perineal hygiene Toileting Assistive Devices: Grab bar or  rail for support Toileting: 0: Activity did not occur  FIM - Diplomatic Services operational officer Devices: Therapist, music Transfers: 0-Activity did not occur  FIM - Architectural technologist Transfer: 4: Chair or W/C > Bed: Min A (steadying Pt. > 75%)  FIM - Locomotion: Wheelchair Distance: 150 Locomotion: Wheelchair: 0: Activity did not occur FIM - Locomotion: Ambulation Locomotion: Ambulation: 0: Activity did not occur  Comprehension Comprehension Mode: Auditory Comprehension: 5-Follows basic conversation/direction: With extra time/assistive device  Expression Expression Mode: Verbal Expression: 5-Expresses basic needs/ideas: With no assist  Social Interaction Social Interaction Mode: Asleep Social Interaction: 5-Interacts appropriately 90% of the time - Needs monitoring or encouragement for participation or interaction.  Problem Solving Problem Solving Mode: Asleep Problem Solving: 5-Solves basic 90% of the time/requires cueing < 10% of the time  Memory Memory Mode: Asleep Memory: 4-Recognizes or recalls 75 - 89% of the time/requires cueing 10 - 24% of the time  Medical Problem List and Plan:  1. DVT Prophylaxis/Anticoagulation: Pharmaceutical: Coumadin  2. Pain Management: monitor on prn Medication for now.Reduced gabapentin to 300mg --much more alert,  phantom pain worse start nortriptyline 3. Mood: difficult to judge. Will monitor for now and have LCSW follow for evaluation.  4. Neuropsych: This patient is capable of making decisions  on his/her own behalf.  5. 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 Lantus discontinued last week. Using  SSI for elevated BS. BS 70-80 rang in the past 24 hours. Will monitor. 6. ESRD: HD M, W, F. Daily weights. Renal diet.  7. Low grade fevers: resolved. 8.  Probable diabetic gastropathy: No reglan due to TD.  Multiple small meals 9.  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  PVD small ischemic ulcers chronic appearing will monitor  LOS (Days) 18 A FACE TO FACE EVALUATION WAS PERFORMED  Jacquelynn Cree 01/02/2012, 9:32 AM

## 2012-01-02 NOTE — Procedures (Addendum)
[facility]  On HD via R upper arm AVG BP--135/68 Goal--3L Hgb--9.1   K+--4.3

## 2012-01-02 NOTE — Progress Notes (Signed)
ANTICOAGULATION CONSULT NOTE - Follow Up Consult  Pharmacy Consult:  Coumadin Indication: VTE prophylaxis  Allergies  Allergen Reactions  . Metoclopramide Hcl Other (See Comments)    Tardive dyskinesia  . Codeine Nausea And Vomiting  . Lipitor (Atorvastatin Calcium) Other (See Comments)    Muscle weakness    Patient Measurements: Weight: 164 lb 10.9 oz (74.7 kg)  Vital Signs: Temp: 98.4 F (36.9 C) (11/11 0553) Temp src: Oral (11/11 0553) BP: 138/74 mmHg (11/11 0553) Pulse Rate: 81  (11/11 0553)  Labs:  Basename 01/02/12 0630 01/01/12 0647 12/31/11 0640 12/30/11 2051  HGB -- -- -- 9.8*  HCT -- -- -- 32.0*  PLT -- -- -- 369  APTT -- -- -- --  LABPROT 18.1* 18.9* 18.1* --  INR 1.55* 1.64* 1.55* --  HEPARINUNFRC -- -- -- --  CREATININE -- -- -- 4.88*  CKTOTAL -- -- -- --  CKMB -- -- -- --  TROPONINI -- -- -- --    The CrCl is unknown because both a height and weight (above a minimum accepted value) are required for this calculation.  Assessment: 56 y/o female patient on Coumadin for VTE prophylaxis s/p BKA for one month. Patient did have elevated baseline INR.   Anticoagulation: Coumadin for DVT px s/p BKA. INR remains subtherapeutic, will increase dose.. .   Goal of Therapy:  INR 2-3   Plan:  Coumadin 5mg  today and f/u daily protime.  Verlene Mayer, PharmD, BCPS Pager [phone]  01/02/2012 10:50 AM

## 2012-01-02 NOTE — Progress Notes (Signed)
Physical Therapy Session Note  Patient Details  Name: Faith [name] MRN: [id number] Date of Birth: [birth year]  Today's Date: 01/02/2012 Time: 1100-1200 Time Calculation (min): 60 min  Short Term Goals: Week 2:  PT Short Term Goal 1 (Week 2): Pt will perform all bed mobility with supervision. PT Short Term Goal 1 - Progress (Week 2): Met PT Short Term Goal 2 (Week 2): Pt will perform simulated car transfer with mod assist. PT Short Term Goal 2 - Progress (Week 2): Met PT Short Term Goal 3 (Week 2): Pt will perform SB transfer w/c> 22" surface with close superviison.   PT Short Term Goal 3 - Progress (Week 2): Met PT Short Term Goal 4 (Week 2): Pt will sit> stand with RW with assist of 1 person. PT Short Term Goal 4 - Progress (Week 2): Not met PT Short Term Goal 5 (Week 2): Pt will stand with RW x 2 minutes during functional activity. PT Short Term Goal 5 - Progress (Week 2): Not met  Skilled Therapeutic Interventions/Progress Updates:  Patient propelled wheelchair on level tile surfaces in controlled environment 100 feet and 125 feet with supervision. Patient required minimal cueing to set up wheelchair for transfer. Patient was able to place sliding board and transfer wheelchair to mat with minimal contact assist. Patient sit to supine with min assist for left LE. Patient educated on phantom pain and desensitization of residual limb. Patient also educated on importance of keeping limb wrapped to shape properly for prosthesis. Patients leg currently is square at the bottom with "dog ears" on each side - staples remain intact. Patient performed right LE exercises to maintain ROM and strengthening in supine and side lying. Patient supine to sit with supervision. Patient was able to position sliding board and transfer back to wheelchair with supervision. Patient required cueing and assist for wheelchair set up following transfer.   Therapy Documentation Precautions:   Precautions Precautions: Fall Precaution Comments: s/p CVA with L hemiparesis (10 years ago)  Required Braces or Orthoses: Knee Immobilizer - Right Knee Immobilizer - Right: On except when in CPM Restrictions Weight Bearing Restrictions: Yes RLE Weight Bearing: Non weight bearing  Pain: Pain Assessment Pain Assessment: 0-10 Pain Score:   6 Pain Type: Surgical pain Pain Location: Leg (phantom pain) Pain Orientation: Right (residual limb) Pain Descriptors: Aching Pain Frequency: Intermittent Pain Onset: Gradual Pain Intervention(s): Medication (See eMAR);Music  See FIM for current functional status  Therapy/Group: Individual Therapy  [name] 01/02/2012, 12:37 PM

## 2012-01-03 ENCOUNTER — Inpatient Hospital Stay (HOSPITAL_COMMUNITY): Payer: Medicare (Managed Care)

## 2012-01-03 ENCOUNTER — Inpatient Hospital Stay (HOSPITAL_COMMUNITY): Payer: Medicare (Managed Care) | Admitting: Speech Pathology

## 2012-01-03 ENCOUNTER — Inpatient Hospital Stay (HOSPITAL_COMMUNITY): Payer: Medicare (Managed Care) | Admitting: *Deleted

## 2012-01-03 DIAGNOSIS — G8110 Spastic hemiplegia affecting unspecified side: Secondary | ICD-10-CM

## 2012-01-03 DIAGNOSIS — S88119A Complete traumatic amputation at level between knee and ankle, unspecified lower leg, initial encounter: Secondary | ICD-10-CM

## 2012-01-03 DIAGNOSIS — I70269 Atherosclerosis of native arteries of extremities with gangrene, unspecified extremity: Secondary | ICD-10-CM

## 2012-01-03 LAB — GLUCOSE, CAPILLARY: Glucose-Capillary: 103 mg/dL — ABNORMAL HIGH (ref 70–99)

## 2012-01-03 MED ORDER — WARFARIN SODIUM 5 MG PO TABS
5.0000 mg | ORAL_TABLET | Freq: Once | ORAL | Status: AC
  Administered 2012-01-03: 5 mg via ORAL
  Filled 2012-01-03: qty 1

## 2012-01-03 NOTE — Progress Notes (Signed)
Recreational Therapy Session Note  Patient Details  Name: [name] [name] MRN: [id number] Date of Birth: 05/03/1955 Today's Date: 01/03/2012 Time:  93-10 Pain: no c/o Skilled Therapeutic Interventions/Progress Updates: Pt seated in recliner upon entry to the room.  Pt talkative and in good spirits today.  Reviewed relaxation training techniques to assist with pain management and provided written resources for pt's use once discharged home.  Pt appreciative of resources. A copy of resources are  provided in shadow chart.  Therapy/Group: Individual Therapy   Quintasia Theroux 01/03/2012, 4:19 PM

## 2012-01-03 NOTE — Progress Notes (Signed)
Patient ID: [name] [name], female   DOB: 06/07/1955, 56 y.o.   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 Lajoyce Corners. Post op started on coumadin for VTE prophylaxis. KI ordered to help keep knee in extension.  ESRD on HD  Subjective/Complaints: I am doing better now, my mind is more clear  Objective: Vital Signs: Blood pressure 134/74, pulse 82, temperature 98.5 F (36.9 C), temperature source Oral, resp. rate 19, weight 74.9 kg (165 lb 2 oz), SpO2 96.00%. No results found. Results for orders placed during the hospital encounter of 12/15/11 (from the past 72 hour(s))  GLUCOSE, CAPILLARY     Status: Abnormal   Collection Time   12/31/11 11:40 AM      Component Value Range Comment   Glucose-Capillary 124 (*) 70 - 99 mg/dL    Comment 1 Notify RN     GLUCOSE, CAPILLARY     Status: Abnormal   Collection Time   12/31/11  4:32 PM      Component Value Range Comment   Glucose-Capillary 52 (*) 70 - 99 mg/dL    Comment 1 Notify RN     GLUCOSE, CAPILLARY     Status: Abnormal   Collection Time   12/31/11  5:10 PM      Component Value Range Comment   Glucose-Capillary 67 (*) 70 - 99 mg/dL    Comment 1 Notify RN     GLUCOSE, CAPILLARY     Status: Normal   Collection Time   12/31/11  5:31 PM      Component Value Range Comment   Glucose-Capillary 71  70 - 99 mg/dL    Comment 1 Notify RN     GLUCOSE, CAPILLARY     Status: Normal   Collection Time   12/31/11  9:01 PM      Component Value Range Comment   Glucose-Capillary 90  70 - 99 mg/dL    Comment 1 Notify RN     PROTIME-INR     Status: Abnormal   Collection Time   01/01/12  6:47 AM      Component Value Range Comment   Prothrombin Time 18.9 (*) 11.6 - 15.2 seconds    INR 1.64 (*) 0.00 - 1.49   GLUCOSE,  CAPILLARY     Status: Abnormal   Collection Time   01/01/12  7:57 AM      Component Value Range Comment   Glucose-Capillary 68 (*) 70 - 99 mg/dL    Comment 1 Notify RN     GLUCOSE, CAPILLARY     Status: Normal   Collection Time   01/01/12  8:12 AM      Component Value Range Comment   Glucose-Capillary 72  70 - 99 mg/dL    Comment 1 Notify RN     GLUCOSE, CAPILLARY     Status: Abnormal   Collection Time   01/01/12 11:50 AM      Component Value Range Comment   Glucose-Capillary 100 (*) 70 - 99 mg/dL    Comment 1 Notify RN     GLUCOSE, CAPILLARY     Status: Abnormal   Collection Time   01/01/12  4:26 PM      Component Value Range Comment   Glucose-Capillary 115 (*) 70 - 99 mg/dL    Comment 1  Notify RN     GLUCOSE, CAPILLARY     Status: Normal   Collection Time   01/01/12  9:08 PM      Component Value Range Comment   Glucose-Capillary 81  70 - 99 mg/dL    Comment 1 Notify RN     GLUCOSE, CAPILLARY     Status: Normal   Collection Time   01/01/12 11:13 PM      Component Value Range Comment   Glucose-Capillary 71  70 - 99 mg/dL    Comment 1 Notify RN     GLUCOSE, CAPILLARY     Status: Normal   Collection Time   01/02/12  1:59 AM      Component Value Range Comment   Glucose-Capillary 81  70 - 99 mg/dL    Comment 1 Notify RN     GLUCOSE, CAPILLARY     Status: Normal   Collection Time   01/02/12  6:22 AM      Component Value Range Comment   Glucose-Capillary 70  70 - 99 mg/dL   PROTIME-INR     Status: Abnormal   Collection Time   01/02/12  6:30 AM      Component Value Range Comment   Prothrombin Time 18.1 (*) 11.6 - 15.2 seconds    INR 1.55 (*) 0.00 - 1.49   GLUCOSE, CAPILLARY     Status: Abnormal   Collection Time   01/02/12  8:00 AM      Component Value Range Comment   Glucose-Capillary 106 (*) 70 - 99 mg/dL    Comment 1 Notify RN     GLUCOSE, CAPILLARY     Status: Abnormal   Collection Time   01/02/12 11:41 AM      Component Value Range Comment    Glucose-Capillary 136 (*) 70 - 99 mg/dL    Comment 1 Notify RN     CBC     Status: Abnormal   Collection Time   01/02/12  3:50 PM      Component Value Range Comment   WBC 7.8  4.0 - 10.5 K/uL    RBC 3.65 (*) 3.87 - 5.11 MIL/uL    Hemoglobin 9.1 (*) 12.0 - 15.0 g/dL    HCT 11.9 (*) 14.7 - 46.0 %    MCV 78.4  78.0 - 100.0 fL    MCH 24.9 (*) 26.0 - 34.0 pg    MCHC 31.8  30.0 - 36.0 g/dL    RDW 82.9 (*) 56.2 - 15.5 %    Platelets 362  150 - 400 K/uL   RENAL FUNCTION PANEL     Status: Abnormal   Collection Time   01/02/12  3:50 PM      Component Value Range Comment   Sodium 126 (*) 135 - 145 mEq/L    Potassium 4.3  3.5 - 5.1 mEq/L    Chloride 86 (*) 96 - 112 mEq/L    CO2 27  19 - 32 mEq/L    Glucose, Bld 149 (*) 70 - 99 mg/dL    BUN 27 (*) 6 - 23 mg/dL    Creatinine, Ser 1.30 (*) 0.50 - 1.10 mg/dL    Calcium 86.5 (*) 8.4 - 10.5 mg/dL    Phosphorus 2.9  2.3 - 4.6 mg/dL    Albumin 2.8 (*) 3.5 - 5.2 g/dL    GFR calc non Af Amer 8 (*) >90 mL/min    GFR calc Af Amer 10 (*) >90 mL/min   GLUCOSE, CAPILLARY  Status: Normal   Collection Time   01/02/12  8:16 PM      Component Value Range Comment   Glucose-Capillary 71  70 - 99 mg/dL    Comment 1 Notify RN     GLUCOSE, CAPILLARY     Status: Abnormal   Collection Time   01/02/12 11:54 PM      Component Value Range Comment   Glucose-Capillary 127 (*) 70 - 99 mg/dL    Comment 1 Notify RN     PROTIME-INR     Status: Abnormal   Collection Time   01/03/12  6:55 AM      Component Value Range Comment   Prothrombin Time 18.8 (*) 11.6 - 15.2 seconds    INR 1.63 (*) 0.00 - 1.49      HEENT: normal Cardio: RRR Resp: Rales GI: BS positive and Distention Extremity:  No Edema Neuro: Alert/Oriented, Abnormal Sensory absent proprioception, absent lt touch L foot, Abnormal Motor 2-/5 R ankle DF/PF, Dysarthric and Other tardive dyskinesia lip smacking Musc/Skel:  Other R BKA,  Gen: NAD-very alert Skin: small ulcer L 4th toe 3mm no erythema  or drainage, dry ulcer 2mm tip of L great toe. R-BKA with staples intact. Dry scab midline--no drainage.  No tenderness.   Assessment/Plan: 1. Functional deficits secondary to R BKA, DM with neuropathy which require 3+ hours per day of interdisciplinary therapy in a comprehensive inpatient rehab setting. Physiatrist is providing close team supervision and 24 hour management of active medical problems listed below. Physiatrist and rehab team continue to assess barriers to discharge/monitor patient progress toward functional and medical goals. FIM: FIM - Bathing Bathing Steps Patient Completed: Chest;Right Arm;Left Arm;Abdomen;Front perineal area;Right upper leg;Left upper leg Bathing: 3: Mod-Patient completes 5-7 37f 10 parts or 50-74%  FIM - Upper Body Dressing/Undressing Upper body dressing/undressing steps patient completed: Thread/unthread left sleeve of pullover shirt/dress;Thread/unthread right sleeve of pullover shirt/dresss;Put head through opening of pull over shirt/dress;Pull shirt over trunk Upper body dressing/undressing: 5: Set-up assist to: Obtain clothing/put away FIM - Lower Body Dressing/Undressing Lower body dressing/undressing steps patient completed: Pull underwear up/down;Pull pants up/down Lower body dressing/undressing: 2: Max-Patient completed 25-49% of tasks  FIM - Toileting Toileting steps completed by patient: Performs perineal hygiene Toileting Assistive Devices: Grab bar or rail for support Toileting: 3: Mod-Patient completed 2 of 3 steps  FIM - Diplomatic Services operational officer Devices: Human resources officer Transfers: 4-To toilet/BSC: Min A (steadying Pt. > 75%);3-From toilet/BSC: Mod A (lift or lower assist)  FIM - Banker Devices: Sliding board Bed/Chair Transfer: 4: Bed > Chair or W/C: Min A (steadying Pt. > 75%);4: Chair or W/C > Bed: Min A (steadying Pt. > 75%)  FIM - Locomotion:  Wheelchair Distance: 150 Locomotion: Wheelchair: 2: Travels 50 - 149 ft with supervision, cueing or coaxing FIM - Locomotion: Ambulation Locomotion: Ambulation: 0: Activity did not occur  Comprehension Comprehension Mode: Auditory Comprehension: 5-Follows basic conversation/direction: With extra time/assistive device  Expression Expression Mode: Verbal Expression: 5-Expresses basic needs/ideas: With no assist  Social Interaction Social Interaction Mode: Asleep Social Interaction: 5-Interacts appropriately 90% of the time - Needs monitoring or encouragement for participation or interaction.  Problem Solving Problem Solving Mode: Asleep Problem Solving: 5-Solves basic 90% of the time/requires cueing < 10% of the time  Memory Memory Mode: Asleep Memory: 4-Recognizes or recalls 75 - 89% of the time/requires cueing 10 - 24% of the time  Medical Problem List and Plan:  1. DVT Prophylaxis/Anticoagulation:  Pharmaceutical: Coumadin  2. Pain Management: monitor on prn Medication for now.Reduced gabapentin to 300mg --much more alert,  phantom pain worse start nortriptyline 3. Mood: difficult to judge. Will monitor for now and have LCSW follow for evaluation.  4. Neuropsych: This patient is capable of making decisions on his/her own behalf.  5. 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 Lantus discontinued last week. Using  SSI for elevated BS. BS 70-80 rang in the past 24 hours. Will monitor. 6. ESRD: HD M, W, F. Daily weights. May liberalize protein as discussed with Nephro 7. Low grade fevers: resolved. 8.  Probable diabetic gastropathy: No reglan due to TD.  Multiple small meals 9.  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  PVD small ischemic ulcers chronic appearing will monitor  LOS (Days) 19 A FACE TO FACE EVALUATION WAS PERFORMED  Kristapher Dubuque E 01/03/2012, 9:43 AM

## 2012-01-03 NOTE — Progress Notes (Signed)
The skilled treatment note and discharge sumary has been reviewed and SLP is in agreement. Fae Pippin, M.A., CCC-SLP [phone]

## 2012-01-03 NOTE — Progress Notes (Signed)
ANTICOAGULATION CONSULT NOTE - Follow Up Consult  Pharmacy Consult:  Coumadin Indication: VTE prophylaxis  Allergies  Allergen Reactions  . Metoclopramide Hcl Other (See Comments)    Tardive dyskinesia  . Codeine Nausea And Vomiting  . Lipitor (Atorvastatin Calcium) Other (See Comments)    Muscle weakness    Patient Measurements: Weight: 165 lb 2 oz (74.9 kg)  Vital Signs: Temp: 98.5 F (36.9 C) (11/12 0500) Temp src: Oral (11/12 0500) BP: 134/74 mmHg (11/12 0500) Pulse Rate: 82  (11/12 0500)  Labs:  Basename 01/03/12 0655 01/02/12 1550 01/02/12 0630 01/01/12 0647  HGB -- 9.1* -- --  HCT -- 28.6* -- --  PLT -- 362 -- --  APTT -- -- -- --  LABPROT 18.8* -- 18.1* 18.9*  INR 1.63* -- 1.55* 1.64*  HEPARINUNFRC -- -- -- --  CREATININE -- 5.25* -- --  CKTOTAL -- -- -- --  CKMB -- -- -- --  TROPONINI -- -- -- --    The CrCl is unknown because both a height and weight (above a minimum accepted value) are required for this calculation.  Assessment: 56 y/o female patient on Coumadin for VTE prophylaxis s/p BKA for one month. Patient did have elevated baseline INR.   Anticoagulation: Coumadin for DVT px s/p BKA. INR remains subtherapeutic but trending up. .   Goal of Therapy:  INR 2-3   Plan:  Coumadin 5mg  today and f/u daily protime.  Verlene Mayer, PharmD, BCPS Pager [phone]  01/03/2012 9:32 AM

## 2012-01-03 NOTE — Progress Notes (Signed)
Subjective:  Sitting in chair, no complaints, hoping for discharge tomorrow.  Objective: Vital signs in last 24 hours: Temp:  [97.2 F (36.2 C)-99.1 F (37.3 C)] 98.5 F (36.9 C) (11/12 0500) Pulse Rate:  [73-85] 82  (11/12 0500) Resp:  [15-19] 19  (11/12 0500) BP: (113-141)/(39-79) 134/74 mmHg (11/12 0500) SpO2:  [96 %-98 %] 96 % (11/12 0500) Weight:  [74.2 kg (163 lb 9.3 oz)-76.4 kg (168 lb 6.9 oz)] 74.9 kg (165 lb 2 oz) (11/12 0500) Weight change: 1.7 kg (3 lb 12 oz)  Intake/Output from previous day: 11/11 0701 - 11/12 0700 In: 720 [P.O.:720] Out: 1631    EXAM: General appearance:  Alert, in no apparent distress Resp:  CTA without rales, rhonchi, or wheezes Cardio:  RRR without murmur or rub GI: + BS, soft and nontender Extremities:  No edema Access:  AVG @ RUA with + bruit  Lab Results:  Basename 01/02/12 1550  WBC 7.8  HGB 9.1*  HCT 28.6*  PLT 362   BMET:  Basename 01/02/12 1550  NA 126*  K 4.3  CL 86*  CO2 27  GLUCOSE 149*  BUN 27*  CREATININE 5.25*  CALCIUM 11.0*  ALBUMIN 2.8*   No results found for this basename: PTH:2 in the last 72 hours Iron Studies: No results found for this basename: IRON,TIBC,TRANSFERRIN,FERRITIN in the last 72 hours  Dialysis Orders: Center: [facility] on MWF Optiflux 180  EDW 78.5 HD Bath 2K/2.0Ca bath Time3.5 Heparin 7000. Access right upper AVGG BFR 500 DFR 800 Zemplar 18 mcg IV/HD Epogen 1000 Units IV/HD Venofer none Other profile 4  Assessment/Plan: 1. 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 Lajoyce Corners on 10/21; rehab in progress; on Coumadin per Ortho for DVT prophylaxis (1 mg qd for 1 month). 2. ESRD - HD on MWF @ [facility]; K 4.3. HD tomorrow. 3. Anemia - Hgb down to 9.1, Aranesp increased to 60 mcg on Wed. 4. HTN/Volume - BP 134/74, no meds; wt 74.9 kg s/p net UF 1.6 L yesterday, as low as 70.4 on 11/8 (old EDW 78.5).  Goal of 2-3 L tomorrow. 5. Secondary hyperparathyroidism - Ca 11 (12 corrected) on 2Ca bath, P 2.9, Zemplar  dc;d, Sensipar 120 mg qd, Renvela 3 with meals. Check Ca in dialysis tomorrow 6. Nutrition - Alb 2.8, on high protein renal diet; added Prostat bid. 7. DM - per primary. 8. Hx CVA - with LUE weakness. 9. Dispo - tentative dc date 11/13 per CIR, will need HHC to be set up prior to dc.    LOS: 19 days   [name],[name] 01/03/2012,9:13 AM [name] [name] is looking forward to going home tomorrow.  She lives with husband, but he works during week.. Rehab and Case Mgr.trying to arrange home health aides.  She'll return to [facility].  Try for early dialysis tomorrow.

## 2012-01-03 NOTE — Progress Notes (Signed)
Occupational Therapy Discharge Summary  Patient Details  Name: [name] [name] MRN: [id number] Date of Birth: 01/21/1956  Today's Date: 01/03/2012 Time: 0730-0830 Time Calculation (min): 60 min  Patient has met 6 of 8 long term goals due to improved activity tolerance, improved balance, postural control, ability to compensate for deficits, improved attention, improved awareness and improved coordination.  Patient to discharge at [facility] I for grooming, Supervision for bathing/toileting/toilet transfer, Min Assist for UB dressing/tub shower transfer, and Max Assist for LB dressing.  Patient's care partner requires assistance to provide the necessary physical assistance at discharge.  Pt will continue to receive assistance from personal care attendant when husband is working.  Reasons goals not met: Pt required increased assistance for LB dressing and UB dressing (pulling shirt down).  Recommendation:  Patient will benefit from ongoing skilled OT services in home health setting to continue to advance functional skills in the area of BADL.  Equipment: recommend drop arm commode  Reasons for discharge: Patient reach maximum potential for rehab,  Patient/family agrees with progress made and goals achieved: Yes  OT Discharge Precautions/Restrictions  Precautions Precautions: Fall Precaution Comments: s/p CVA with L hemiparesis (10 years ago) Required Braces or Orthoses: Knee Immobilizer - Right Knee Immobilizer - Right: On except when in CPM Restrictions Weight Bearing Restrictions: Yes RLE Weight Bearing: Non weight bearing (RLE) General  Pain Pain Assessment Pain Assessment: 0-10 Pain Score:   5 Pain Type: Surgical pain Pain Location: Leg Pain Orientation: Right Pain Descriptors: Discomfort Pain Intervention(s): Medication (See eMAR) Vision/Perception  Vision - History Baseline Vision: Wears glasses only for reading Patient Visual Report: No change from baseline Vision -  Assessment Eye Alignment: Within Functional Limits  Cognition Overall Cognitive Status: Impaired Arousal/Alertness: Awake/alert Orientation Level: Oriented X4 Memory: Impaired Awareness: Impaired Problem Solving: Impaired Sensation Sensation Light Touch: Appears Intact (Bil UE) Stereognosis: Appears Intact Hot/Cold: Appears Intact Proprioception: Appears Intact (Bil UE) Coordination Gross Motor Movements are Fluid and Coordinated: Yes Fine Motor Movements are Fluid and Coordinated: Yes Motor  Motor Motor - Skilled Clinical Observations: Left hemiparesis Mobility  Bed Mobility Bed Mobility: Supine to Sit Rolling Right: 6: Modified independent (Device/Increase time);With rail Rolling Left: 6: Modified independent (Device/Increase time);With rail Supine to Sit: 6: Modified independent (Device/Increase time);With rails Sitting - Scoot to Edge of Bed: 6: Modified independent (Device/Increase time);With rail  Extremity/Trunk Assessment RUE Assessment RUE Assessment: Within Functional Limits (MMT; 4/5) LUE Assessment LUE Assessment: Within Functional Limits (MMT; 4/5)  See FIM for current functional status  D/C ADL completed this date. Patient has progressed in therapy and has met all LTGs except one. Patient is ready for D/C tomorrow. Recommend drop arm commode.  Limmie Patricia, OTR/L 01/03/2012, 11:13 AM

## 2012-01-03 NOTE — Progress Notes (Addendum)
Physical Therapy Discharge Summary  Patient Details  Name: [name] [name] MRN: [id number] Date of Birth: [birth year]  Today's Date: 01/03/2012 Time: 1035-1130 and 1350-1435 Time Calculation (min):  55 and 45 min  Patient has met 7 of 7 long term goals due to improved activity tolerance, improved balance, improved postural control, increased strength, decreased pain, ability to compensate for deficits, functional use of  left upper extremity and left lower extremity and improved awareness.  Patient to discharge at a wheelchair level Supervision.   Patient's care partner is independent to provide the necessary physical and cognitive assistance at discharge.  Reasons goals not met: n/a  Recommendation:  Patient will benefit from ongoing skilled PT services in home health setting to continue to advance safe functional mobility, address ongoing impairments in strength, balance, LLE motor control, flexiblity, and minimize fall risk.  Equipment: slideboard; custom w/c pending from [employer] (loaner at d/c)  Reasons for discharge: treatment goals met and discharge from hospital  Patient/family agrees with progress made and goals achieved: Yes  PT Discharge Precautions/Restrictions- falls; fragile skin due to incontinence     Pain Pain Assessment Pain Assessment: 0-10 Pain Score:   6/10 in  AM Pain Location: Leg; toes (phantom and surgical) Pain Orientation: Right Pain Intervention(s): Medication (See eMAR) Multiple Pain Sites: No    Cognition Overall Cognitive Status: Impaired Arousal/Alertness: Awake/alert Orientation Level: Oriented X4 Attention: Alternating Alternating Attention: Appears intact Memory: Impaired Memory Impairment: Decreased recall of new information;Decreased short term memory Decreased Short Term Memory: Verbal complex;Functional complex Awareness: Impaired Awareness Impairment: Anticipatory impairment Problem Solving: Impaired Problem  Solving Impairment: Functional complex;Functional basic Safety/Judgment: Impaired Sensation Sensation Light Touch: Impaired Detail (absent L toes) Proprioception Impaired Details: Impaired LLE Additional Comments: some kinesthesia, but not proprioception Coordination Gross Motor Movements are Fluid and Coordinated: No Coordination and Movement Description: LLE decreased speed, accuracy and excursion Motor  Motor Motor - Skilled Clinical Observations: Left hemiparesis  Mobility Bed Mobility Bed Mobility: Rolling Right;Rolling Left;Right Sidelying to Sit Rolling Right: 6: Modified independent (Device/Increase time) Rolling Left: 6: Modified independent (Device/Increase time) Right Sidelying to Sit: 5: Supervision Supine to Sit: 5: Supervision Sitting - Scoot to Edge of Bed: 6: Modified independent (Device/Increase time) Locomotion  Ambulation Ambulation: No Gait Gait: No Corporate treasurer: Yes Wheelchair Assistance: 5: Investment banker, operational: Both upper extremities Wheelchair Parts Management: Needs assistance Distance: 150  Trunk/Postural Assessment  Cervical Assessment Cervical Assessment: Within Functional Limits Thoracic Assessment Thoracic Assessment: Within Functional Limits Lumbar Assessment Lumbar Assessment: Within Functional Limits (trunk shortened R; sits in posterior pelvic tilt at rest) Postural Control Postural Control: Deficits on evaluation (limited postural reactions)  Balance Static Sitting Balance Static Sitting - Level of Assistance: 6: Modified independent (Device/Increase time) Dynamic Sitting Balance Dynamic Sitting - Balance Support: Feet supported Dynamic Sitting - Level of Assistance: 5: Stand by assistance Dynamic Sitting Balance - Compensations: decreased BOS and vision deficits altering sense of midline Extremity Assessment  RLE- residual limb developing a dog ear on medial side; frequent phantom pain of  lower leg and toes  RLE Strength RLE Overall Strength Comments: grossly 4-/5 hip, inhibited by knee pain; knee ext 2-/5  LLE Assessment: Exceptions to WFL LLE PROM (degrees) Left Ankle Dorsiflexion:  (NT); continuing with PF contracture LLE Strength LLE Overall Strength Comments: hip flexion 2+/5; knee extension 3-/5; ankle DF 1/5; toe ext 1/5   Treatment today: AM-   husband [name] [name], and Newman Nickels, [employer] and Mobility  here for w/c evaluation.   Husband transferred pt from recliner> w/c> mat> w/c, using SB, close supervision after w/c and SB prep.  Sitting with feet supported, pt performed lateral leans to L and R and lateral reaching L and R slightly out of BOS 10 x 1 each.  Scooting L><R on edge of mat, with close supervision.  Pt performed phantom pain exercises in sitting after c/o R toe pain: with RLe covered by pillow case, cued for bil toe tapping.  Phantom pain resolved in less than 3 minutes.  Instructed husband and pt in this technique.  Scooting back in w/c with VCs to use bil hands on armrests, rather than R hand and L elbow. Discussed BR accessibility; Br doors = 24"wide.  Husband is not sure if [facility] will widen BR doorways in his home.   Therapist discussed possiiblity of HHPT trying  a kitchen chair in BR doorway, which pt could transfer to and helper could scoot further into room; husband understood the concept.  Lorin Picket will bring loaner w/c to pt today or tomorrow before d/c.   PM- Pain rated 7/10 RLE surgical, premedicated.    Therapist examined R residual limb: dog ear developing medial side.  Dressing changed and re-wrapped, KI applied.  W/c> bed 22" high, as at home, with supervision, min cues for SB placement.  Bed mobility on regular bed- modified independent.  Therapeutic exercise performed with bil LEs to increase strength for functional mobility in supine on bed: bil bridging with assist; R terminal knee extension, L straight leg raise.  W/c mobility x 150'  on level tile with supervision, extra time.    See FIM for current functional status  Trudee Chirino 01/03/2012, 3:07 PM

## 2012-01-03 NOTE — Progress Notes (Signed)
Speech Language Pathology Daily Session Note and Discharge Summary  Patient Details  Name: SUTTON PLAKE MRN: [id number] Date of Birth: 01-05-1956  Today's Date: 01/03/2012 Time: 1130-1200 Time Calculation (min): 30 min  Short Term Goals: Week 2: SLP Short Term Goal 1 (Week 2): Patient will utilize external aids to facilitate increased recall of new information with Min A visual and verbal cuing. SLP Short Term Goal 2 (Week 2): Patient will demonstrate improved anticipatory awareness during discharge discussions with Min A verbal cuing.  SLP Short Term Goal 3 (Week 2): Patient will solve basic problems during functional tasks with Supervision visual and verbal cuing.  Skilled Therapeutic Interventions: Session targeted compensatory strategies for recall of new and daily information and basic problem solving for family and patient education.  Patient utilized Public relations account executive to facilitate increased recall of daily information with supervision verbal cues.  Patient continues to exhibit difficulty making meal selections that are appropriate for a renal diet as evidenced by her frequent requests to therapists and nutritional services for a hot dog and french fries for lunch.  SLP facilitated session with re-education about appropriate meal choices utilizing the renal diet booklet provided by the dietitian.  Husband present at the beginning of the session; SLP educated husband about strategies to facilitate increased recall and provided him with educational materials regarding compensatory strategies for memory.  Education complete at this time.    FIM:  Comprehension Comprehension Mode: Auditory Comprehension: 5-Follows basic conversation/direction: With extra time/assistive device Expression Expression Mode: Verbal Expression: 5-Expresses basic needs/ideas: With no assist Social Interaction Social Interaction: 5-Interacts appropriately 90% of the time - Needs monitoring or encouragement for  participation or interaction. Problem Solving Problem Solving: 4-Solves basic 75 - 89% of the time/requires cueing 10 - 24% of the time Memory Memory: 4-Recognizes or recalls 75 - 89% of the time/requires cueing 10 - 24% of the time  Pain Pain Assessment Pain Assessment: 0-10 Pain Score:   3 Pain Type: Phantom pain;Surgical pain Pain Location: Leg Pain Orientation: Right Pain Descriptors: Aching Pain Intervention(s): RN made aware Multiple Pain Sites: No  Therapy/Group: Individual Therapy  Jackalyn Lombard, Conrad Vazquez  Graduate Clinician Speech Language Pathology   Nakeia Calvi, Joni Reining 01/03/2012, 12:32 PM   Speech Language Pathology Discharge Summary  Patient Details  Name: ERLINE SIDDOWAY MRN: [id number] Date of Birth: 1956-02-09  Today's Date: 01/03/2012  Patient has met 4 of 4 long term goals.  Patient to discharge at overall Min;Supervision level.  Reasons goals not met: N/A   Clinical Impression/Discharge Summary: Patient met 4 out of 4 long term goals during CIR stay with functional gains in problem solving and utilization of compensatory strategies for recall of daily and new information.  Patient is at an overall min assist-supervision level for problem solving during basic and moderately complex functional tasks.  Patient utilizes a Public relations account executive as a compensatory strategy for day to day recall with supervision verbal cues.  Patient continues to require min-mod assist verbal cues to manage her renal diet and make appropriate meal choices with the use of a booklet provided by nutritional services.  Patient and family education completed 11/12 to address utilization of compensatory strategies for recall and problem solving.  SLP also provided patient's husband with a handout of recall strategies as well as abovementioned renal diet meal planning booklet.  SLP suspects cognition to be at baseline and patient's husband verbalized and demonstrated understanding of cognitive compensatory  strategies; as a result, no f/u speech services are recommended at  this time.  SLP will defer f/u to PT and OT if cognition negatively impacts future progress in therapy.      Care Partner:  Caregiver Able to Provide Assistance: Yes  Type of Caregiver Assistance: Physical;Cognitive  Recommendation:  24 hour supervision/assistance     Equipment: None   Reasons for discharge: Discharged from hospital   Patient/Family Agrees with Progress Made and Goals Achieved: Yes   See FIM for current functional status  Jackalyn Lombard, Conrad Latimer  Graduate Clinician Speech Language Pathology   Yiannis Tulloch, Joni Reining 01/03/2012, 1:27 PM

## 2012-01-03 NOTE — Progress Notes (Signed)
Social Work Patient ID: [name] [name], female   DOB: [birth year], 56 y.o.   MRN: [id number] Met with pt and husband during wheelchair evaluation-[place] Seating he will have  Rental wheelchair for her tomorrow prior to discharge. Have ordered drop-arm bsc and 24 transfer board for home use.  [facility] working on follow up therapies and coverage for 24 hr care.  They have spoken With husband.  Discharge set for tomorrow.  Both pt and husband feel comfortable and ready for discharge.

## 2012-01-03 NOTE — Progress Notes (Signed)
Nutrition Follow-up  Intervention:   Continue current regimen. Reinforced need for diet restrictions when d/c.  Assessment:   Intake of meals is overall improved, consuming at least 50% at each meal.  Diet Order:  Regular; 1200 ml fluid restriction Supplement: Nepro Shake PO BID; Resource Breeze daily  Meds: Scheduled Meds:    . aspirin EC  81 mg Oral Daily  . cinacalcet  120 mg Oral Q24H  . darbepoetin (ARANESP) injection - DIALYSIS  60 mcg Intravenous Q Wed-HD  . ezetimibe  10 mg Oral Daily  . feeding supplement (NEPRO CARB STEADY)  237 mL Oral BID BM  . feeding supplement  1 Container Oral Q24H  . fenofibrate  54 mg Oral Daily  . gabapentin  300 mg Oral QHS  . insulin aspart  0-9 Units Subcutaneous TID WC  . insulin aspart  3 Units Subcutaneous TID WC  . multivitamin  1 tablet Oral QHS  . Muscle Rub   Topical TID  . pantoprazole  40 mg Oral Q1200  . senna-docusate  1 tablet Oral BID  . sevelamer  2,400 mg Oral TID WC  . traMADol  50 mg Oral TID AC  . [COMPLETED] warfarin  5 mg Oral ONCE-1800  . warfarin  5 mg Oral ONCE-1800  . Warfarin - Pharmacist Dosing Inpatient   Does not apply q1800   Continuous Infusions:  PRN Meds:.acetaminophen, bisacodyl, dextrose, docusate sodium, Glycerin (Adult), heparin, HYDROcodone-acetaminophen, lidocaine, lidocaine-prilocaine, methocarbamol, pentafluoroprop-tetrafluoroeth   CMP     Component Value Date/Time   NA 126* 01/02/2012 1550   K 4.3 01/02/2012 1550   CL 86* 01/02/2012 1550   CO2 27 01/02/2012 1550   GLUCOSE 149* 01/02/2012 1550   BUN 27* 01/02/2012 1550   CREATININE 5.25* 01/02/2012 1550   CALCIUM 11.0* 01/02/2012 1550   CALCIUM 9.8 01/18/2011 0540   PROT 8.0 12/12/2011 1414   ALBUMIN 2.8* 01/02/2012 1550   AST 30 12/12/2011 1414   ALT 18 12/12/2011 1414   ALKPHOS 111 12/12/2011 1414   BILITOT 1.8* 12/12/2011 1414   GFRNONAA 8* 01/02/2012 1550   GFRAA 10* 01/02/2012 1550   Phosphorus  Date/Time Value Range Status    01/02/2012  3:50 PM 2.9  2.3 - 4.6 mg/dL Final    CBG (last 3)   Basename 01/03/12 1237 01/03/12 0711 01/02/12 2354  GLUCAP 117* 61* 127*     Intake/Output Summary (Last 24 hours) at 01/03/12 1417 Last data filed at 01/03/12 1300  Gross per 24 hour  Intake    180 ml  Output   1631 ml  Net  -1451 ml  BM 11/11  Weight Status:  165 lb s/p HD on 11/12 - trending up 154 lb s/p HD on 11/4 154 lb s/p HD on 10/30  Estimated needs:  2000 - 2200 kcal, 86 - 100 grams protein  Nutrition Dx:  Inadequate oral intake r/t GI distress AEB pt report and decreasing weight. Resolved.  Goal:  Pt to meet >/= 90% of their estimated nutrition needs - met.  Monitor:  weight trends, lab trends, I/O's, PO intake, supplement tolerance  Jarold Motto MS, RD, LDN Pager: [phone] After-hours pager: [phone]

## 2012-01-04 LAB — CBC
HCT: 29.8 % — ABNORMAL LOW (ref 36.0–46.0)
Hemoglobin: 9.1 g/dL — ABNORMAL LOW (ref 12.0–15.0)
MCHC: 30.5 g/dL (ref 30.0–36.0)
MCV: 78.2 fL (ref 78.0–100.0)
RDW: 16.9 % — ABNORMAL HIGH (ref 11.5–15.5)
WBC: 8.5 10*3/uL (ref 4.0–10.5)

## 2012-01-04 LAB — PROTIME-INR: Prothrombin Time: 20.9 seconds — ABNORMAL HIGH (ref 11.6–15.2)

## 2012-01-04 LAB — RENAL FUNCTION PANEL
Albumin: 2.7 g/dL — ABNORMAL LOW (ref 3.5–5.2)
BUN: 18 mg/dL (ref 6–23)
Chloride: 94 mEq/L — ABNORMAL LOW (ref 96–112)
Creatinine, Ser: 4.25 mg/dL — ABNORMAL HIGH (ref 0.50–1.10)
Glucose, Bld: 48 mg/dL — ABNORMAL LOW (ref 70–99)

## 2012-01-04 LAB — GLUCOSE, CAPILLARY

## 2012-01-04 MED ORDER — SODIUM CHLORIDE 0.9 % IV SOLN: 100.0000 mL | INTRAVENOUS | Status: DC | PRN

## 2012-01-04 MED ORDER — SEVELAMER HCL 800 MG PO TABS: 1600.0000 mg | ORAL_TABLET | ORAL | Status: DC

## 2012-01-04 MED ORDER — WARFARIN SODIUM 5 MG PO TABS
5.0000 mg | ORAL_TABLET | Freq: Once | ORAL | Status: DC
  Filled 2012-01-04: qty 1

## 2012-01-04 MED ORDER — DARBEPOETIN ALFA-POLYSORBATE 60 MCG/0.3ML IJ SOLN
INTRAMUSCULAR | Status: AC
  Administered 2012-01-04: 60 ug via INTRAVENOUS
  Filled 2012-01-04: qty 0.3

## 2012-01-04 MED ORDER — HYDROCODONE-ACETAMINOPHEN 5-325 MG PO TABS: 1.0000 | ORAL_TABLET | Freq: Four times a day (QID) | ORAL | Status: DC | PRN

## 2012-01-04 MED ORDER — ALTEPLASE 2 MG IJ SOLR: 2.0000 mg | Freq: Once | INTRAMUSCULAR | Status: DC | PRN

## 2012-01-04 MED ORDER — HEPARIN SODIUM (PORCINE) 1000 UNIT/ML DIALYSIS: 20.0000 [IU]/kg | INTRAMUSCULAR | Status: DC | PRN

## 2012-01-04 MED ORDER — NEPRO/CARBSTEADY PO LIQD: 237.0000 mL | ORAL | Status: DC | PRN

## 2012-01-04 MED ORDER — LIDOCAINE-PRILOCAINE 2.5-2.5 % EX CREA: 1.0000 "application " | TOPICAL_CREAM | CUTANEOUS | Status: DC | PRN

## 2012-01-04 MED ORDER — HEPARIN SODIUM (PORCINE) 1000 UNIT/ML DIALYSIS: 1000.0000 [IU] | INTRAMUSCULAR | Status: DC | PRN

## 2012-01-04 MED ORDER — GABAPENTIN 300 MG PO CAPS: 300.0000 mg | ORAL_CAPSULE | Freq: Every day | ORAL | Status: DC

## 2012-01-04 MED ORDER — SENNOSIDES-DOCUSATE SODIUM 8.6-50 MG PO TABS: 1.0000 | ORAL_TABLET | Freq: Two times a day (BID) | ORAL | Status: DC

## 2012-01-04 MED ORDER — HYDROCODONE-ACETAMINOPHEN 5-325 MG PO TABS
ORAL_TABLET | ORAL | Status: AC
  Filled 2012-01-04: qty 1

## 2012-01-04 MED ORDER — METHOCARBAMOL 500 MG PO TABS: 500.0000 mg | ORAL_TABLET | Freq: Four times a day (QID) | ORAL | Status: DC | PRN

## 2012-01-04 MED ORDER — TRAMADOL HCL 50 MG PO TABS: 50.0000 mg | ORAL_TABLET | Freq: Three times a day (TID) | ORAL | Status: DC

## 2012-01-04 MED ORDER — PENTAFLUOROPROP-TETRAFLUOROETH EX AERO: 1.0000 "application " | INHALATION_SPRAY | CUTANEOUS | Status: DC | PRN

## 2012-01-04 MED ORDER — ONDANSETRON HCL 4 MG PO TABS
4.0000 mg | ORAL_TABLET | Freq: Once | ORAL | Status: AC
  Administered 2012-01-04: 4 mg via ORAL

## 2012-01-04 MED ORDER — LIDOCAINE HCL (PF) 1 % IJ SOLN: 5.0000 mL | INTRAMUSCULAR | Status: DC | PRN

## 2012-01-04 MED ORDER — WARFARIN SODIUM 5 MG PO TABS: ORAL_TABLET | ORAL | Status: DC

## 2012-01-04 NOTE — Progress Notes (Signed)
Subjective:   Seen on HD, comfortable, no complaints, anticipating discharge.  Objective: Vital signs in last 24 hours: Temp:  [97.6 F (36.4 C)-99.6 F (37.6 C)] 97.6 F (36.4 C) (11/13 0646) Pulse Rate:  [75-88] 82  (11/13 0646) Resp:  [18-20] 18  (11/13 0646) BP: (119-143)/(72-74) 143/72 mmHg (11/13 0646) SpO2:  [93 %-100 %] 100 % (11/13 0646) Weight:  [74.2 kg (163 lb 9.3 oz)-75 kg (165 lb 5.5 oz)] 75 kg (165 lb 5.5 oz) (11/13 0646) Weight change: -2.2 kg (-4 lb 13.6 oz)  Intake/Output from previous day: 11/12 0701 - 11/13 0700 In: 420 [P.O.:420] Out: -  Intake/Output this shift: Total I/O In: 240 [P.O.:240] Out: -  EXAM: General appearance:  Alert, in no apparent distress Resp:  CTA without rales, rhonchi, or wheezes Cardio:  RRR without murmur or rub GI: + BS, soft and wheezes Extremities:  No edema, right BKA Access: AVG @ RUA with BFR 500 cc/min  Lab Results:  Basename 01/02/12 1550  WBC 7.8  HGB 9.1*  HCT 28.6*  PLT 362   BMET:  Basename 01/02/12 1550  NA 126*  K 4.3  CL 86*  CO2 27  GLUCOSE 149*  BUN 27*  CREATININE 5.25*  CALCIUM 11.0*  ALBUMIN 2.8*   No results found for this basename: PTH:2 in the last 72 hours Iron Studies: No results found for this basename: IRON,TIBC,TRANSFERRIN,FERRITIN in the last 72 hours  Dialysis Orders: Center: [facility] on MWF Optiflux 180  EDW 78.5 HD Bath 2K/2.0Ca bath Time3.5 Heparin 7000. Access right upper AVGG BFR 500 DFR 800 Zemplar 18 mcg IV/HD Epogen 1000 Units IV/HD Venofer none Other profile 4  Assessment/Plan: 1. 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 Lajoyce Corners on 10/21; completed rehab with discharge today; on Coumadin per Ortho for DVT prophylaxis (1 mg qd for 1 month). 2. ESRD - HD on MWF @ [facility]; K 4.3. HD now. 3. Anemia - Hgb down to 9.1, Aranesp increased to 60 mcg on Wed.  Fe/TIBC 10% sat, but ferritin 2280 on 28 Oct (therefore no IV Fe 4. HTN/Volume - BP 143/72, no meds; wt 75 kg, as low as 70.4 on  11/8 (old EDW 78.5). Goal of 4 L today. 5. Secondary hyperparathyroidism - Ca 11 (12 corrected) on 2Ca bath, P 2.9, Zemplar dc;d, Sensipar 120 mg qd, Renvela 3 with meals.  Check renal panel. 6. Nutrition - Alb 2.8, on high protein renal diet; added Prostat bid. 7. DM - per primary. 8. Hx CVA - with LUE weakness. 9. Dispo - tentative dc date 11/13 per CIR, will need HHC to be set up prior to dc.     LOS: 20 days   Faith [name],Faith [name] 01/04/2012,6:53 AM Currently on HD via R UA AVG.  Looking forward to going home.  BP 146/77, goal 3.5 L.  Hgb 9.1, PT 21 sec/INR 1.9.  Ca pending (2 Ca bath and off zemplar)

## 2012-01-04 NOTE — Progress Notes (Signed)
ANTICOAGULATION CONSULT NOTE - Follow Up Consult  Pharmacy Consult:  Coumadin Indication: VTE prophylaxis  Allergies  Allergen Reactions  . Metoclopramide Hcl Other (See Comments)    Tardive dyskinesia  . Codeine Nausea And Vomiting  . Lipitor (Atorvastatin Calcium) Other (See Comments)    Muscle weakness    Patient Measurements: Weight: 165 lb 5.5 oz (75 kg)  Vital Signs: Temp: 97.6 F (36.4 C) (11/13 0646) Temp src: Oral (11/13 0646) BP: 134/70 mmHg (11/13 0930) Pulse Rate: 79  (11/13 0930)  Labs:  Basename 01/04/12 0727 01/04/12 0726 01/03/12 0655 01/02/12 1550 01/02/12 0630  HGB 9.1* -- -- 9.1* --  HCT 29.8* -- -- 28.6* --  PLT 339 -- -- 362 --  APTT -- -- -- -- --  LABPROT -- 20.9* 18.8* -- 18.1*  INR -- 1.88* 1.63* -- 1.55*  HEPARINUNFRC -- -- -- -- --  CREATININE 4.25* -- -- 5.25* --  CKTOTAL -- -- -- -- --  CKMB -- -- -- -- --  TROPONINI -- -- -- -- --    The CrCl is unknown because both a height and weight (above a minimum accepted value) are required for this calculation.  Assessment: 56 y/o female patient on Coumadin for VTE prophylaxis s/p BKA for one month. Patient did have elevated baseline INR.   Anticoagulation: INR is trending up but planning for coumadin 1mg  daily anyway per Dr. Lajoyce Corners .   Goal of Therapy:  INR 2-3   Plan:   Coumadin 5mg  PO x1 today then 1mg  qday per Dr. Lajoyce Corners

## 2012-01-04 NOTE — Progress Notes (Signed)
Social Work Discharge Note Discharge Note  The overall goal for the admission was met for:   Discharge location: Yes-HOME WITH HUSBAND AND [facility]-24 HR CARE  Length of Stay: Yes-20 DAYS  Discharge activity level: Yes-SUPERVISION/MIN LEVEL  Home/community participation: Yes  Services provided included: MD, RD, PT, OT, SLP, RN, TR, Pharmacy, Neuropsych and SW  Financial Services: Medicare and Private Insurance: BCBS  Follow-up services arranged: Home Health: [facility]-PT,OT,RN, DME: Teaching laboratory technician, [facility]-DROP-ARM BSC, 24 TRANSFER BOARD and Other: [facility]  Comments (or additional information):[facility] IS AWARE OF TEAM'S RECOMMENDATIONS OF 24 HR CARE AND ARE WORKING ON ARRANGING.  Patient/Family verbalized understanding of follow-up arrangements: Yes  Individual responsible for coordination of the follow-up plan: JAMES-HUSBAND  Confirmed correct DME delivered: Lucy Chris 01/04/2012    Lucy Chris

## 2012-01-04 NOTE — Progress Notes (Signed)
Patient ID: Faith [name], female   DOB: [birth year], 56 y.o.   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 Lajoyce Corners. Post op started on coumadin for VTE prophylaxis. KI ordered to help keep knee in extension.  ESRD on HD  Subjective/Complaints: I am doing better now, my mind is more clear  Objective: Vital Signs: Blood pressure 160/97, pulse 95, temperature 97.6 F (36.4 C), temperature source Oral, resp. rate 20, weight 75 kg (165 lb 5.5 oz), SpO2 100.00%. No results found. Results for orders placed during the hospital encounter of 12/15/11 (from the past 72 hour(s))  GLUCOSE, CAPILLARY     Status: Abnormal   Collection Time   01/01/12 11:50 AM      Component Value Range Comment   Glucose-Capillary 100 (*) 70 - 99 mg/dL    Comment 1 Notify RN     GLUCOSE, CAPILLARY     Status: Abnormal   Collection Time   01/01/12  4:26 PM      Component Value Range Comment   Glucose-Capillary 115 (*) 70 - 99 mg/dL    Comment 1 Notify RN     GLUCOSE, CAPILLARY     Status: Normal   Collection Time   01/01/12  9:08 PM      Component Value Range Comment   Glucose-Capillary 81  70 - 99 mg/dL    Comment 1 Notify RN     GLUCOSE, CAPILLARY     Status: Normal   Collection Time   01/01/12 11:13 PM      Component Value Range Comment   Glucose-Capillary 71  70 - 99 mg/dL    Comment 1 Notify RN     GLUCOSE, CAPILLARY     Status: Normal   Collection Time   01/02/12  1:59 AM      Component Value Range Comment   Glucose-Capillary 81  70 - 99 mg/dL    Comment 1 Notify RN     GLUCOSE, CAPILLARY     Status: Normal   Collection Time   01/02/12  6:22 AM      Component Value Range Comment   Glucose-Capillary 70  70 - 99 mg/dL   PROTIME-INR     Status: Abnormal   Collection Time   01/02/12  6:30 AM      Component Value Range Comment   Prothrombin Time 18.1 (*) 11.6 - 15.2 seconds    INR 1.55 (*) 0.00 - 1.49   GLUCOSE, CAPILLARY     Status: Abnormal   Collection Time   01/02/12  8:00 AM      Component Value Range Comment   Glucose-Capillary 106 (*) 70 - 99 mg/dL    Comment 1 Notify RN     GLUCOSE, CAPILLARY     Status: Abnormal   Collection Time   01/02/12 11:41 AM      Component Value Range Comment   Glucose-Capillary 136 (*) 70 - 99 mg/dL    Comment 1 Notify RN     CBC     Status: Abnormal   Collection Time   01/02/12  3:50 PM      Component Value Range Comment   WBC 7.8  4.0 - 10.5 K/uL    RBC 3.65 (*) 3.87 - 5.11 MIL/uL    Hemoglobin 9.1 (*) 12.0 -  15.0 g/dL    HCT 78.4 (*) 69.6 - 46.0 %    MCV 78.4  78.0 - 100.0 fL    MCH 24.9 (*) 26.0 - 34.0 pg    MCHC 31.8  30.0 - 36.0 g/dL    RDW 29.5 (*) 28.4 - 15.5 %    Platelets 362  150 - 400 K/uL   RENAL FUNCTION PANEL     Status: Abnormal   Collection Time   01/02/12  3:50 PM      Component Value Range Comment   Sodium 126 (*) 135 - 145 mEq/L    Potassium 4.3  3.5 - 5.1 mEq/L    Chloride 86 (*) 96 - 112 mEq/L    CO2 27  19 - 32 mEq/L    Glucose, Bld 149 (*) 70 - 99 mg/dL    BUN 27 (*) 6 - 23 mg/dL    Creatinine, Ser 1.32 (*) 0.50 - 1.10 mg/dL    Calcium 44.0 (*) 8.4 - 10.5 mg/dL    Phosphorus 2.9  2.3 - 4.6 mg/dL    Albumin 2.8 (*) 3.5 - 5.2 g/dL    GFR calc non Af Amer 8 (*) >90 mL/min    GFR calc Af Amer 10 (*) >90 mL/min   GLUCOSE, CAPILLARY     Status: Normal   Collection Time   01/02/12  8:16 PM      Component Value Range Comment   Glucose-Capillary 71  70 - 99 mg/dL    Comment 1 Notify RN     GLUCOSE, CAPILLARY     Status: Abnormal   Collection Time   01/02/12 11:54 PM      Component Value Range Comment   Glucose-Capillary 127 (*) 70 - 99 mg/dL    Comment 1 Notify RN     PROTIME-INR     Status: Abnormal   Collection Time   01/03/12  6:55 AM      Component Value  Range Comment   Prothrombin Time 18.8 (*) 11.6 - 15.2 seconds    INR 1.63 (*) 0.00 - 1.49   GLUCOSE, CAPILLARY     Status: Abnormal   Collection Time   01/03/12  7:11 AM      Component Value Range Comment   Glucose-Capillary 61 (*) 70 - 99 mg/dL    Comment 1 Notify RN     GLUCOSE, CAPILLARY     Status: Abnormal   Collection Time   01/03/12 12:37 PM      Component Value Range Comment   Glucose-Capillary 117 (*) 70 - 99 mg/dL    Comment 1 Notify RN     GLUCOSE, CAPILLARY     Status: Abnormal   Collection Time   01/03/12  4:39 PM      Component Value Range Comment   Glucose-Capillary 103 (*) 70 - 99 mg/dL   GLUCOSE, CAPILLARY     Status: Normal   Collection Time   01/03/12  9:18 PM      Component Value Range Comment   Glucose-Capillary 97  70 - 99 mg/dL   PROTIME-INR     Status: Abnormal   Collection Time   01/04/12  7:26 AM      Component Value Range Comment   Prothrombin Time 20.9 (*) 11.6 - 15.2 seconds    INR 1.88 (*) 0.00 - 1.49   CBC     Status: Abnormal   Collection Time   01/04/12  7:27 AM      Component Value Range  Comment   WBC 8.5  4.0 - 10.5 K/uL    RBC 3.81 (*) 3.87 - 5.11 MIL/uL    Hemoglobin 9.1 (*) 12.0 - 15.0 g/dL    HCT 16.1 (*) 09.6 - 46.0 %    MCV 78.2  78.0 - 100.0 fL    MCH 23.9 (*) 26.0 - 34.0 pg    MCHC 30.5  30.0 - 36.0 g/dL    RDW 04.5 (*) 40.9 - 15.5 %    Platelets 339  150 - 400 K/uL   RENAL FUNCTION PANEL     Status: Abnormal   Collection Time   01/04/12  7:27 AM      Component Value Range Comment   Sodium 133 (*) 135 - 145 mEq/L DELTA CHECK NOTED   Potassium 4.1  3.5 - 5.1 mEq/L    Chloride 94 (*) 96 - 112 mEq/L DELTA CHECK NOTED   CO2 26  19 - 32 mEq/L    Glucose, Bld 48 (*) 70 - 99 mg/dL    BUN 18  6 - 23 mg/dL    Creatinine, Ser 8.11 (*) 0.50 - 1.10 mg/dL    Calcium 91.4  8.4 - 10.5 mg/dL    Phosphorus 2.7  2.3 - 4.6 mg/dL    Albumin 2.7 (*) 3.5 - 5.2 g/dL    GFR calc non Af Amer 11 (*) >90 mL/min    GFR calc Af Amer 12 (*) >90  mL/min      HEENT: normal Cardio: RRR Resp: clear bilaterally. GI: BS positive without distension. Extremity:  No Edema Neuro: Alert/Oriented, Abnormal Sensory absent proprioception, absent lt touch L foot, Abnormal Motor 2-/5 R ankle DF/PF, Dysarthric and Other tardive dyskinesia lip smacking Musc/Skel:  R BKA with minimal edema.  No edema BUE/LLE.  Gen: NAD-very alert Skin: small ulcer L 4th toe 3mm no erythema or drainage, dry ulcer 2mm tip of L great toe. R-BKA with staples intact. Dry scab midline--no drainage.  No tenderness.    Assessment/Plan: 1. Functional deficits secondary to R BKA, DM with neuropathy stable for D/C, f/u VVS,f/u Nephro, f/u PM&R             FIM: FIM - Bathing Bathing Steps Patient Completed: Chest;Right Arm;Left Arm;Abdomen;Front perineal area;Buttocks;Right upper leg;Left upper leg;Left lower leg (including foot) Bathing: 5: Set-up assist to: Obtain items  FIM - Upper Body Dressing/Undressing Upper body dressing/undressing steps patient completed: Thread/unthread right sleeve of pullover shirt/dresss;Thread/unthread left sleeve of pullover shirt/dress;Put head through opening of pull over shirt/dress;Pull shirt over trunk Upper body dressing/undressing: 4: Min-Patient completed 75 plus % of tasks FIM - Lower Body Dressing/Undressing Lower body dressing/undressing steps patient completed: Thread/unthread right underwear leg;Thread/unthread left underwear leg;Pull underwear up/down;Thread/unthread right pants leg;Thread/unthread left pants leg;Pull pants up/down;Don/Doff left sock;Don/Doff left shoe;Fasten/unfasten left shoe Lower body dressing/undressing: 2: Max-Patient completed 25-49% of tasks  FIM - Toileting Toileting steps completed by patient: Adjust clothing prior to toileting;Performs perineal hygiene;Adjust clothing after toileting Toileting Assistive Devices: Grab bar or rail for support Toileting: 5: Set-up assist to: Obtain supplies  FIM -  Diplomatic Services operational officer Devices: Human resources officer Transfers: 5-To toilet/BSC: Supervision (verbal cues/safety issues);5-From toilet/BSC: Supervision (verbal cues/safety issues)  FIM - Press photographer Assistive Devices: Arm rests;Sliding board Bed/Chair Transfer: 6: Supine > Sit: No assist;6: Sit > Supine: No assist;5: Bed > Chair or W/C: Supervision (verbal cues/safety issues);5: Chair or W/C > Bed: Supervision (verbal cues/safety issues)  FIM - Locomotion: Wheelchair Distance: 150 Locomotion:  Wheelchair: 5: Travels 150 ft or more: maneuvers on rugs and over door sills with supervision, cueing or coaxing FIM - Locomotion: Ambulation Locomotion: Ambulation: 0: Activity did not occur  Comprehension Comprehension Mode: Auditory Comprehension: 5-Follows basic conversation/direction: With extra time/assistive device  Expression Expression Mode: Verbal Expression: 5-Expresses basic needs/ideas: With no assist  Social Interaction Social Interaction Mode: Asleep Social Interaction: 5-Interacts appropriately 90% of the time - Needs monitoring or encouragement for participation or interaction.  Problem Solving Problem Solving Mode: Asleep Problem Solving: 4-Solves basic 75 - 89% of the time/requires cueing 10 - 24% of the time  Memory Memory Mode: Asleep Memory: 4-Recognizes or recalls 75 - 89% of the time/requires cueing 10 - 24% of the time  Medical Problem List and Plan:  1. DVT Prophylaxis/Anticoagulation: Pharmaceutical: Coumadin  2. Pain Management: monitor on prn Medication for now.Reduced gabapentin to 300mg --much more alert,. Scheduled ultram effective.  3. Mood: Appears to have good outlook overall.   4. Neuropsych: This patient is capable of making decisions on his/her own behalf.  5. 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 Lantus discontinued last week. Using  3 units meal coverage with some hypoglycemia--BS.  BS 61-117 range past 24 hours on regular diet. 6. ESRD: HD M, W, F. Daily weights. May liberalize protein as discussed with Nephro 7. Low grade fevers: resolved. 8.  Probable diabetic gastropathy: No reglan due to TD.  Multiple small meals 9.  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  PVD small ischemic ulcers chronic appearing will monitor 11. Anemia of chronic disease: stable.  12.  HTN, renal to adjust given HD LOS (Days) 20 A FACE TO FACE EVALUATION WAS PERFORMED  Love, Evlyn Kanner 01/04/2012, 9:09 AM

## 2012-01-04 NOTE — Progress Notes (Signed)
Discharged to home accompanied by spouse.  D/C instructions given by  Marissa Nestle PAC and pt and spouse state an understanding of information, no questions noted. Reported nausea without med ordered; PAm notified and one time order received and given for nausea. Pamelia Hoit

## 2012-01-04 NOTE — Progress Notes (Signed)
Recreational Therapy Discharge Summary Patient Details  Name: Faith [name] MRN: [id number] Date of Birth: 1955/07/08 Today's Date: 01/04/2012  Comments on progress toward goals: TR sessions focused on pain management techniques/relaxation training.  Techniques included aromatherapy, deep breathing exercise (diaphragamatic breathing), visualization, and guided imagery.  Pt discharged home with husband who has been educated on the above and was provided with written resources for use post discharge.    Reasons for discharge: discharge from hospital Patient/family agrees with progress made and goals achieved: Yes  [name] 01/04/2012, 4:07 PM

## 2012-01-05 LAB — GLUCOSE, CAPILLARY: Glucose-Capillary: 108 mg/dL — ABNORMAL HIGH (ref 70–99)

## 2012-01-10 ENCOUNTER — Inpatient Hospital Stay: Payer: Medicare Other | Admitting: Physical Medicine & Rehabilitation

## 2012-01-10 NOTE — Progress Notes (Signed)
Discharge summary [phone]

## 2012-01-11 NOTE — Discharge Summary (Signed)
Faith [name], Faith [name] NO.:  [id number]  MEDICAL RECORD NO.:  [id number]  LOCATION:  4036                         FACILITY:  [facility]  PHYSICIAN:  Erick Colace, M.D.DATE OF BIRTH:  06/18/1955  DATE OF ADMISSION:  12/15/2011 DATE OF DISCHARGE:  01/04/2012                              DISCHARGE SUMMARY   DISCHARGE DIAGNOSES: 1. Right below-knee amputation. 2. Diabetes mellitus with nephropathy. 3. Anemia of chronic disease. 4. Hypertension. 5. 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 6. Diabetic retinopathy. 7. End-stage renal disease.  HISTORY OF PRESENT ILLNESS:  Ms. 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  She was admitted on December 12, 2011, for right BKA by Dr. Lajoyce Corners.  Postop was started on Coumadin for VTE prophylaxis.  Knee immobilizer was ordered to keep the knee in extension.  She has had some problems with lethargy and confusion question due to narcotics.  Therapies were initiated and therapy team recommended CIR for progression.  PAST MEDICAL HISTORY: 1. Coronary artery disease status post CABG in 2002. 2. PAD. 3. Hypoparathyroidism. 4. GERD. 5. Anemia of chronic disease. 6. Tardive dyskinesia due to Reglan. 7. Depression. 8. Neuropathy. 9. History of PE in 2005. 10.CVA with left hemiparesis. 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 12.Hypertension. 13.End-stage renal disease.  FUNCTIONAL HISTORY:  The patient was sedentary but independent for ambulating short distances.  She used wheelchair for community access and attended [facility] program Tuesdays through Thursdays. Had an aide for couple of hours in the morning and couple of hours in the evening.  FUNCTIONAL STATUS:  The patient was min to mod  assist for bed mobility, +2 total assist 30% for sit to stand.  Required +2 total assist 50% for squat pivot transfers.  She required mod assist for upper body bathing, min assist for dressing, max to total assist for lower body care.  RECENT LABORATORY DATA:  Last check of labs from January 04, 2012, revealed a sodium 133, potassium 4.1, chloride 94, CO2 26, BUN 18, creatinine 4.25, glucose 48, phosphorus 2.7, albumin 2.7.  CBC revealed hemoglobin 9.1, hematocrit 29.8, white count 8.5, platelets 339.  DIAGNOSTIC STUDIES:  Chest x-rays from December 17, 2011, revealed no significant changes in cardiomegaly and mild pulmonary vascular congestion.  HOSPITAL COURSE:  Ms. Faith [name] was admitted to Rehab on December 15, 2011, for inpatient therapies to consist of PT, OT at least 3 hours 5 days a week.  Past admission, physiatrist, rehab, RN, and therapy team have worked together to provide customized, collaborative and interdisciplinary care.  Rehab RN has worked with the patient on bowel program as well as wound care monitoring.  The patient's blood pressures were checked on b.i.d. basis and these were reasonably controlled.  Her blood sugars were monitored on before meal and at bedtime basis.  P.o. intake was poor, and therefore her Lantus insulin was discontinued. Additionally, her diet was liberalized to regular diet to increase her food choices and improve overall intake.   Initially, the patient was limited by  pain but narcotics were held due to sedation.  Her Neurontin was decreased to 300 mg p.o. at bedtime with resolution of lethargy.Tramadol 50 mg p.o. t.i.d. was added to help with neuropathic pain but was ineffective.  Therefore prn Robaxin and hydrocodone were added additionally for pain management. The patient's wound was onitored along.  This was instead healing well without any signs or symptoms of infection.  Staples remained intact with dried scabs noted along the incision  line.  Blood sugars at the time of discharge are ranging from 70s to 106 range.  Husband is advised to continue checking blood sugars on before meals and at bedtime basis and provide frequent small meals to help maintain adequate intake. The patient was maintained on Coumadin for DVT prophylaxis during this stay. Coumadin was discontinued at the time of discharge per input from Dr. Lajoyce Corners.  During the patient's stay in rehab, weekly team conferences were held to monitor the patient's progress, set goals, as well as discuss barriers to discharge.  By the time of discharge, the patient required supervision at wheelchair level.  She is showing overall improvement in activity tolerance, strength, as well as postural control.  Further follow up home health therapies to continue to address her ongoing impairments in strength, balance, as well as left lower extremity motor control and flexibility.  Family education was done with husband who is supportive and will provide supervision and assistance past discharge.  OT has worked with the patient on self-care task.  The patient was modified independent for grooming, supervision for bathing, toileting and toilet transfers.  She requires min assist for upper body bathing and tub shower transfers, max assist for lower body dressing.  The patient will continue receiving assistance from personal care attendant to help complete her ADL tasks.    Speech Therapy has also been ongoing to help the patient with anticipating awareness, problem solving, as well as utilization of external aids to increase recall of new information.  The patient continues to require min to mod verbal cues to manage her renal diet as well as min assist/supervision for problem solving during basic and moderately complex functional tasks.  Supervision is recommended past discharge.  We suspect that the patient's cognition is currently at baseline and therefore no follow up Speech  Therapy Services are indicated past discharge.  On January 04, 2012, the patient is discharged to home in improved condition.  DISCHARGE MEDICATIONS: 1. Hydrocodone 5-325 one p.o. q.6 hours p.r.n. pain. 2. Robaxin 500 mg p.o. q.6 hours p.r.n. spasms. 3. Senna-S one p.o. b.i.d. 4. Tramadol 50 mg p.o. t.i.d. 5. Gabapentin 300 mg p.o. at bedtime. 6. Albuterol inhaler 2 puffs q.4 hours p.r.n. shortness of breath. 7. Coated aspirin 81 mg p.o. per day. 8. Sensipar 30 mg in a.m., 90 mg in p.m. 9. Zetia 10 mg p.o. per day. 10.Lofibra 134 mg per day. 11.Robitussin 15 mL q.6 hours p.r.n. coughing. 12 Use home SSI regimen. 13.Multivitamin 1 p.o. per day. 14.Nitroglycerin p.r.n. chest pain. 15.Protonix 40 mg per day. 16.Renagel 800 mg 4 tabs with meals and 2 tabs with snacks.  DIET:  Renal diet with carb restrictions, 1200 cc fluid daily.  ACTIVITY LEVEL:  24 hour supervision.  SPECIAL INSTRUCTIONS:  Wash BKA site with soap and water, pat dry, and apply dry compressive dressing.  Apply cream or lotion to left foot b.i.d.  Wear a knee immobilizer to keep amputated thigh straight as possible.  Check blood sugars before meals and at bedtime basis.  Follow up therapies and RN to be scheduled per [facility].  FOLLOWUP:  The patient to follow up with Dr. Dorothe Pea for instructions regarding resumption of Lantus in future.  Follow up with Dr. Aldean Baker for postop check and staple removal in 7-10 days.  Follow up with Dr. Claudette Laws, January 31, 2012, at 11 a.m.     Delle Reining, P.A.   ______________________________ Erick Colace, M.D.    PL/MEDQ  D:  01/10/2012  T:  01/11/2012  Job:  [id number]  cc:   Nadara Mustard, MD Wilber Bihari. Caryn Section, M.D.

## 2012-01-13 ENCOUNTER — Encounter (HOSPITAL_COMMUNITY): Payer: Self-pay | Admitting: Pharmacy Technician

## 2012-01-16 ENCOUNTER — Encounter (HOSPITAL_COMMUNITY): Payer: Self-pay | Admitting: *Deleted

## 2012-01-17 ENCOUNTER — Encounter (HOSPITAL_COMMUNITY): Payer: Self-pay | Admitting: Anesthesiology

## 2012-01-17 ENCOUNTER — Inpatient Hospital Stay (HOSPITAL_COMMUNITY): Payer: Medicare (Managed Care) | Admitting: Anesthesiology

## 2012-01-17 ENCOUNTER — Encounter (HOSPITAL_COMMUNITY)
Admission: RE | Disposition: A | Discharge status: Home / Self Care | Payer: Self-pay | Source: Ambulatory Visit | Attending: Orthopedic Surgery

## 2012-01-17 ENCOUNTER — Inpatient Hospital Stay (HOSPITAL_COMMUNITY)
Admission: RE | Admit: 2012-01-17 | Discharge: 2012-01-23 | DRG: 474 | Disposition: A | Discharge status: Home / Self Care | Payer: Medicare (Managed Care) | Source: Ambulatory Visit | Attending: Orthopedic Surgery | Admitting: Orthopedic Surgery

## 2012-01-17 DIAGNOSIS — E785 Hyperlipidemia, unspecified: Secondary | ICD-10-CM | POA: Diagnosis present

## 2012-01-17 DIAGNOSIS — Y835 Amputation of limb(s) as the cause of abnormal reaction of the patient, or of later complication, without mention of misadventure at the time of the procedure: Secondary | ICD-10-CM | POA: Diagnosis present

## 2012-01-17 DIAGNOSIS — K5909 Other constipation: Secondary | ICD-10-CM | POA: Diagnosis present

## 2012-01-17 DIAGNOSIS — I5032 Chronic diastolic (congestive) heart failure: Secondary | ICD-10-CM | POA: Diagnosis present

## 2012-01-17 DIAGNOSIS — T8789 Other complications of amputation stump: Principal | ICD-10-CM | POA: Diagnosis present

## 2012-01-17 DIAGNOSIS — E11319 Type 2 diabetes mellitus with unspecified diabetic retinopathy without macular edema: Secondary | ICD-10-CM | POA: Diagnosis present

## 2012-01-17 DIAGNOSIS — I2510 Atherosclerotic heart disease of native coronary artery without angina pectoris: Secondary | ICD-10-CM | POA: Diagnosis present

## 2012-01-17 DIAGNOSIS — Y9210 Unspecified residential institution as the place of occurrence of the external cause: Secondary | ICD-10-CM | POA: Diagnosis present

## 2012-01-17 DIAGNOSIS — G589 Mononeuropathy, unspecified: Secondary | ICD-10-CM | POA: Diagnosis present

## 2012-01-17 DIAGNOSIS — Z833 Family history of diabetes mellitus: Secondary | ICD-10-CM

## 2012-01-17 DIAGNOSIS — Z7901 Long term (current) use of anticoagulants: Secondary | ICD-10-CM

## 2012-01-17 DIAGNOSIS — I120 Hypertensive chronic kidney disease with stage 5 chronic kidney disease or end stage renal disease: Secondary | ICD-10-CM | POA: Diagnosis present

## 2012-01-17 DIAGNOSIS — S88119A Complete traumatic amputation at level between knee and ankle, unspecified lower leg, initial encounter: Secondary | ICD-10-CM

## 2012-01-17 DIAGNOSIS — R29898 Other symptoms and signs involving the musculoskeletal system: Secondary | ICD-10-CM | POA: Diagnosis present

## 2012-01-17 DIAGNOSIS — M869 Osteomyelitis, unspecified: Secondary | ICD-10-CM | POA: Diagnosis present

## 2012-01-17 DIAGNOSIS — D631 Anemia in chronic kidney disease: Secondary | ICD-10-CM | POA: Diagnosis present

## 2012-01-17 DIAGNOSIS — Z7982 Long term (current) use of aspirin: Secondary | ICD-10-CM

## 2012-01-17 DIAGNOSIS — K219 Gastro-esophageal reflux disease without esophagitis: Secondary | ICD-10-CM | POA: Diagnosis present

## 2012-01-17 DIAGNOSIS — Z8249 Family history of ischemic heart disease and other diseases of the circulatory system: Secondary | ICD-10-CM

## 2012-01-17 DIAGNOSIS — Z992 Dependence on renal dialysis: Secondary | ICD-10-CM

## 2012-01-17 DIAGNOSIS — Z794 Long term (current) use of insulin: Secondary | ICD-10-CM

## 2012-01-17 DIAGNOSIS — S98139A Complete traumatic amputation of one unspecified lesser toe, initial encounter: Secondary | ICD-10-CM

## 2012-01-17 DIAGNOSIS — Z9861 Coronary angioplasty status: Secondary | ICD-10-CM

## 2012-01-17 DIAGNOSIS — I69998 Other sequelae following unspecified cerebrovascular disease: Secondary | ICD-10-CM

## 2012-01-17 DIAGNOSIS — I739 Peripheral vascular disease, unspecified: Secondary | ICD-10-CM | POA: Diagnosis present

## 2012-01-17 DIAGNOSIS — Z79899 Other long term (current) drug therapy: Secondary | ICD-10-CM

## 2012-01-17 DIAGNOSIS — N2581 Secondary hyperparathyroidism of renal origin: Secondary | ICD-10-CM | POA: Diagnosis present

## 2012-01-17 DIAGNOSIS — E1139 Type 2 diabetes mellitus with other diabetic ophthalmic complication: Secondary | ICD-10-CM | POA: Diagnosis present

## 2012-01-17 DIAGNOSIS — I96 Gangrene, not elsewhere classified: Secondary | ICD-10-CM | POA: Diagnosis present

## 2012-01-17 DIAGNOSIS — Z951 Presence of aortocoronary bypass graft: Secondary | ICD-10-CM

## 2012-01-17 DIAGNOSIS — N186 End stage renal disease: Secondary | ICD-10-CM | POA: Diagnosis present

## 2012-01-17 DIAGNOSIS — M129 Arthropathy, unspecified: Secondary | ICD-10-CM | POA: Diagnosis present

## 2012-01-17 DIAGNOSIS — Z86711 Personal history of pulmonary embolism: Secondary | ICD-10-CM

## 2012-01-17 DIAGNOSIS — N039 Chronic nephritic syndrome with unspecified morphologic changes: Secondary | ICD-10-CM | POA: Diagnosis present

## 2012-01-17 DIAGNOSIS — I252 Old myocardial infarction: Secondary | ICD-10-CM

## 2012-01-17 DIAGNOSIS — I509 Heart failure, unspecified: Secondary | ICD-10-CM | POA: Diagnosis present

## 2012-01-17 DIAGNOSIS — E1159 Type 2 diabetes mellitus with other circulatory complications: Secondary | ICD-10-CM | POA: Diagnosis present

## 2012-01-17 HISTORY — PX: ABOVE KNEE LEG AMPUTATION: SUR20

## 2012-01-17 HISTORY — PX: AMPUTATION: SHX166

## 2012-01-17 LAB — GLUCOSE, CAPILLARY
Glucose-Capillary: 76 mg/dL (ref 70–99)
Glucose-Capillary: 89 mg/dL (ref 70–99)
Glucose-Capillary: 79 mg/dL (ref 70–99)
Glucose-Capillary: 88 mg/dL (ref 70–99)

## 2012-01-17 LAB — PROTIME-INR
INR: 1.33 (ref 0.00–1.49)
Prothrombin Time: 16.2 seconds — ABNORMAL HIGH (ref 11.6–15.2)

## 2012-01-17 LAB — BASIC METABOLIC PANEL
BUN: 8 mg/dL (ref 6–23)
Calcium: 9.5 mg/dL (ref 8.4–10.5)
GFR calc Af Amer: 15 mL/min — ABNORMAL LOW (ref >90)
GFR calc non Af Amer: 13 mL/min — ABNORMAL LOW (ref >90)
Potassium: 3.1 mEq/L — ABNORMAL LOW (ref 3.5–5.1)
Sodium: 137 mEq/L (ref 135–145)

## 2012-01-17 LAB — CBC
Hemoglobin: 10.1 g/dL — ABNORMAL LOW (ref 12.0–15.0)
MCHC: 30.3 g/dL (ref 30.0–36.0)
Platelets: 399 10*3/uL (ref 150–400)

## 2012-01-17 LAB — SURGICAL PCR SCREEN: Staphylococcus aureus: NEGATIVE

## 2012-01-17 SURGERY — AMPUTATION, ABOVE KNEE
Anesthesia: General | Site: Leg Lower | Laterality: Right | Wound class: Dirty or Infected

## 2012-01-17 MED ORDER — ZOLPIDEM TARTRATE 5 MG PO TABS: 5.0000 mg | ORAL_TABLET | Freq: Every evening | ORAL | Status: DC | PRN

## 2012-01-17 MED ORDER — RENA-VITE PO TABS
1.0000 | ORAL_TABLET | Freq: Every day | ORAL | Status: DC
  Administered 2012-01-17 – 2012-01-22 (×7): 1 via ORAL
  Filled 2012-01-17 (×8): qty 1

## 2012-01-17 MED ORDER — ONDANSETRON HCL 4 MG/2ML IJ SOLN
INTRAMUSCULAR | Status: DC | PRN
  Administered 2012-01-17: 4 mg via INTRAVENOUS

## 2012-01-17 MED ORDER — HEPARIN SODIUM (PORCINE) 1000 UNIT/ML DIALYSIS: 1000.0000 [IU] | INTRAMUSCULAR | Status: DC | PRN

## 2012-01-17 MED ORDER — CINACALCET HCL 30 MG PO TABS
30.0000 mg | ORAL_TABLET | Freq: Two times a day (BID) | ORAL | Status: DC
  Filled 2012-01-17 (×2): qty 3

## 2012-01-17 MED ORDER — ASPIRIN EC 81 MG PO TBEC
81.0000 mg | DELAYED_RELEASE_TABLET | Freq: Every day | ORAL | Status: DC
  Administered 2012-01-17 – 2012-01-23 (×7): 81 mg via ORAL
  Filled 2012-01-17 (×7): qty 1

## 2012-01-17 MED ORDER — HYDROMORPHONE HCL PF 1 MG/ML IJ SOLN
INTRAMUSCULAR | Status: AC
  Filled 2012-01-17: qty 1

## 2012-01-17 MED ORDER — METHOCARBAMOL 500 MG PO TABS
500.0000 mg | ORAL_TABLET | Freq: Four times a day (QID) | ORAL | Status: DC | PRN
  Administered 2012-01-18 – 2012-01-23 (×6): 500 mg via ORAL
  Filled 2012-01-17 (×6): qty 1

## 2012-01-17 MED ORDER — DOCUSATE SODIUM 283 MG RE ENEM: 1.0000 | ENEMA | RECTAL | Status: DC | PRN

## 2012-01-17 MED ORDER — MUPIROCIN 2 % EX OINT
TOPICAL_OINTMENT | CUTANEOUS | Status: AC
  Filled 2012-01-17: qty 22

## 2012-01-17 MED ORDER — CINACALCET HCL 30 MG PO TABS
30.0000 mg | ORAL_TABLET | Freq: Every day | ORAL | Status: DC
  Administered 2012-01-18 – 2012-01-23 (×5): 30 mg via ORAL
  Filled 2012-01-17 (×7): qty 1

## 2012-01-17 MED ORDER — ONDANSETRON HCL 4 MG PO TABS
4.0000 mg | ORAL_TABLET | Freq: Four times a day (QID) | ORAL | Status: DC | PRN
  Administered 2012-01-22 – 2012-01-23 (×2): 4 mg via ORAL
  Filled 2012-01-17 (×2): qty 1

## 2012-01-17 MED ORDER — WARFARIN SODIUM 5 MG PO TABS
5.0000 mg | ORAL_TABLET | Freq: Once | ORAL | Status: AC
  Administered 2012-01-17: 5 mg via ORAL
  Filled 2012-01-17: qty 1

## 2012-01-17 MED ORDER — COUMADIN BOOK
Freq: Once | Status: DC
  Filled 2012-01-17: qty 1

## 2012-01-17 MED ORDER — ONDANSETRON HCL 4 MG/2ML IJ SOLN: 4.0000 mg | Freq: Four times a day (QID) | INTRAMUSCULAR | Status: DC | PRN

## 2012-01-17 MED ORDER — CALCIUM CARBONATE 1250 MG/5ML PO SUSP: 500.0000 mg | Freq: Four times a day (QID) | ORAL | Status: DC | PRN

## 2012-01-17 MED ORDER — INSULIN ASPART 100 UNIT/ML ~~LOC~~ SOLN
0.0000 [IU] | Freq: Three times a day (TID) | SUBCUTANEOUS | Status: DC
Start: 2012-01-17 — End: 2012-01-24
  Administered 2012-01-18 – 2012-01-22 (×7): 1 [IU] via SUBCUTANEOUS

## 2012-01-17 MED ORDER — WARFARIN VIDEO
Freq: Once | Status: AC
  Administered 2012-01-17: 17:00:00

## 2012-01-17 MED ORDER — SODIUM CHLORIDE 0.9 % IV SOLN: INTRAVENOUS | Status: DC

## 2012-01-17 MED ORDER — PANTOPRAZOLE SODIUM 40 MG PO TBEC
40.0000 mg | DELAYED_RELEASE_TABLET | Freq: Every day | ORAL | Status: DC
  Administered 2012-01-18 – 2012-01-23 (×6): 40 mg via ORAL
  Filled 2012-01-17 (×6): qty 1

## 2012-01-17 MED ORDER — ALTEPLASE 2 MG IJ SOLR: 2.0000 mg | Freq: Once | INTRAMUSCULAR | Status: AC | PRN

## 2012-01-17 MED ORDER — NITROGLYCERIN 0.4 MG SL SUBL: 0.4000 mg | SUBLINGUAL_TABLET | SUBLINGUAL | Status: DC | PRN

## 2012-01-17 MED ORDER — HYDROCODONE-ACETAMINOPHEN 5-325 MG PO TABS
1.0000 | ORAL_TABLET | ORAL | Status: DC | PRN
  Administered 2012-01-17 – 2012-01-19 (×5): 2 via ORAL
  Administered 2012-01-19: 1 via ORAL
  Administered 2012-01-19 – 2012-01-23 (×13): 2 via ORAL
  Filled 2012-01-17 (×4): qty 2
  Filled 2012-01-17: qty 1
  Filled 2012-01-17 (×12): qty 2

## 2012-01-17 MED ORDER — OXYCODONE-ACETAMINOPHEN 5-325 MG PO TABS
1.0000 | ORAL_TABLET | ORAL | Status: DC | PRN
  Administered 2012-01-22: 2 via ORAL
  Filled 2012-01-17: qty 2

## 2012-01-17 MED ORDER — NAPHAZOLINE HCL 0.1 % OP SOLN
1.0000 [drp] | Freq: Four times a day (QID) | OPHTHALMIC | Status: DC | PRN
  Filled 2012-01-17: qty 15

## 2012-01-17 MED ORDER — PROMETHAZINE HCL 25 MG/ML IJ SOLN: 6.2500 mg | INTRAMUSCULAR | Status: DC | PRN

## 2012-01-17 MED ORDER — CEFAZOLIN SODIUM-DEXTROSE 2-3 GM-% IV SOLR
INTRAVENOUS | Status: DC | PRN
  Administered 2012-01-17: 2 g via INTRAVENOUS

## 2012-01-17 MED ORDER — CEFAZOLIN SODIUM 1-5 GM-% IV SOLN: 1.0000 g | Freq: Four times a day (QID) | INTRAVENOUS | Status: DC

## 2012-01-17 MED ORDER — 0.9 % SODIUM CHLORIDE (POUR BTL) OPTIME
TOPICAL | Status: DC | PRN
  Administered 2012-01-17: 1000 mL

## 2012-01-17 MED ORDER — METHOCARBAMOL 100 MG/ML IJ SOLN
500.0000 mg | Freq: Four times a day (QID) | INTRAVENOUS | Status: DC | PRN
  Filled 2012-01-17: qty 5

## 2012-01-17 MED ORDER — INSULIN ASPART 100 UNIT/ML ~~LOC~~ SOLN
3.0000 [IU] | Freq: Three times a day (TID) | SUBCUTANEOUS | Status: DC
  Administered 2012-01-20 – 2012-01-22 (×3): 3 [IU] via SUBCUTANEOUS

## 2012-01-17 MED ORDER — HYDROMORPHONE HCL PF 1 MG/ML IJ SOLN
0.5000 mg | INTRAMUSCULAR | Status: DC | PRN
  Administered 2012-01-17 – 2012-01-19 (×5): 1 mg via INTRAVENOUS
  Filled 2012-01-17 (×4): qty 1

## 2012-01-17 MED ORDER — CAMPHOR-MENTHOL 0.5-0.5 % EX LOTN: 1.0000 "application " | TOPICAL_LOTION | Freq: Three times a day (TID) | CUTANEOUS | Status: DC | PRN

## 2012-01-17 MED ORDER — ACETAMINOPHEN 650 MG RE SUPP: 650.0000 mg | Freq: Four times a day (QID) | RECTAL | Status: DC | PRN

## 2012-01-17 MED ORDER — OXYCODONE HCL 5 MG/5ML PO SOLN: 5.0000 mg | Freq: Once | ORAL | Status: DC | PRN

## 2012-01-17 MED ORDER — GUAIFENESIN 100 MG/5ML PO SOLN
15.0000 mL | Freq: Four times a day (QID) | ORAL | Status: DC | PRN
  Filled 2012-01-17: qty 15

## 2012-01-17 MED ORDER — OXYCODONE HCL 5 MG PO TABS: 5.0000 mg | ORAL_TABLET | Freq: Once | ORAL | Status: DC | PRN

## 2012-01-17 MED ORDER — METOCLOPRAMIDE HCL 10 MG PO TABS: 5.0000 mg | ORAL_TABLET | Freq: Three times a day (TID) | ORAL | Status: DC | PRN

## 2012-01-17 MED ORDER — SENNOSIDES-DOCUSATE SODIUM 8.6-50 MG PO TABS
1.0000 | ORAL_TABLET | Freq: Two times a day (BID) | ORAL | Status: DC
  Administered 2012-01-17 – 2012-01-23 (×11): 1 via ORAL
  Filled 2012-01-17 (×11): qty 1

## 2012-01-17 MED ORDER — HYDROMORPHONE HCL PF 1 MG/ML IJ SOLN
0.2500 mg | INTRAMUSCULAR | Status: DC | PRN
  Administered 2012-01-17: 0.5 mg via INTRAVENOUS
  Administered 2012-01-17: 0.25 mg via INTRAVENOUS
  Administered 2012-01-17 (×2): 0.5 mg via INTRAVENOUS

## 2012-01-17 MED ORDER — SORBITOL 70 % SOLN: 30.0000 mL | Status: DC | PRN

## 2012-01-17 MED ORDER — FENTANYL CITRATE 0.05 MG/ML IJ SOLN
INTRAMUSCULAR | Status: AC
  Filled 2012-01-17: qty 2

## 2012-01-17 MED ORDER — ONDANSETRON HCL 4 MG PO TABS
4.0000 mg | ORAL_TABLET | Freq: Four times a day (QID) | ORAL | Status: DC | PRN
Start: 2012-01-17 — End: 2012-01-17

## 2012-01-17 MED ORDER — PROPOFOL 10 MG/ML IV BOLUS
INTRAVENOUS | Status: DC | PRN
  Administered 2012-01-17: 150 mg via INTRAVENOUS

## 2012-01-17 MED ORDER — GABAPENTIN 300 MG PO CAPS
300.0000 mg | ORAL_CAPSULE | Freq: Every day | ORAL | Status: DC
  Administered 2012-01-17 – 2012-01-22 (×6): 300 mg via ORAL
  Filled 2012-01-17 (×7): qty 1

## 2012-01-17 MED ORDER — METOCLOPRAMIDE HCL 5 MG/ML IJ SOLN: 5.0000 mg | Freq: Three times a day (TID) | INTRAMUSCULAR | Status: DC | PRN

## 2012-01-17 MED ORDER — ALBUTEROL SULFATE HFA 108 (90 BASE) MCG/ACT IN AERS
2.0000 | INHALATION_SPRAY | RESPIRATORY_TRACT | Status: DC | PRN
  Filled 2012-01-17: qty 6.7

## 2012-01-17 MED ORDER — LIDOCAINE HCL (CARDIAC) 20 MG/ML IV SOLN
INTRAVENOUS | Status: DC | PRN
  Administered 2012-01-17: 100 mg via INTRAVENOUS

## 2012-01-17 MED ORDER — SODIUM CHLORIDE 0.9 % IV SOLN
INTRAVENOUS | Status: DC
  Administered 2012-01-17: 10:00:00 via INTRAVENOUS

## 2012-01-17 MED ORDER — FENTANYL CITRATE 0.05 MG/ML IJ SOLN
INTRAMUSCULAR | Status: DC | PRN
  Administered 2012-01-17: 50 ug via INTRAVENOUS
  Administered 2012-01-17 (×2): 100 ug via INTRAVENOUS

## 2012-01-17 MED ORDER — POLYVINYL ALCOHOL 1.4 % OP SOLN
1.0000 [drp] | Freq: Every day | OPHTHALMIC | Status: DC | PRN
  Administered 2012-01-21: 1 [drp] via OPHTHALMIC
  Filled 2012-01-17: qty 15

## 2012-01-17 MED ORDER — DARBEPOETIN ALFA-POLYSORBATE 25 MCG/0.42ML IJ SOLN
12.5000 ug | INTRAMUSCULAR | Status: DC
  Administered 2012-01-18: 12.5 ug via INTRAVENOUS
  Filled 2012-01-17: qty 0.42

## 2012-01-17 MED ORDER — WARFARIN - PHARMACIST DOSING INPATIENT: Freq: Every day | Status: DC

## 2012-01-17 MED ORDER — FENTANYL CITRATE 0.05 MG/ML IJ SOLN
50.0000 ug | INTRAMUSCULAR | Status: AC | PRN
  Administered 2012-01-17 (×2): 50 ug via INTRAVENOUS

## 2012-01-17 MED ORDER — HYDROXYZINE HCL 25 MG PO TABS: 25.0000 mg | ORAL_TABLET | Freq: Three times a day (TID) | ORAL | Status: DC | PRN

## 2012-01-17 MED ORDER — SEVELAMER CARBONATE 800 MG PO TABS
800.0000 mg | ORAL_TABLET | Freq: Three times a day (TID) | ORAL | Status: DC
  Administered 2012-01-17 – 2012-01-23 (×17): 800 mg via ORAL
  Filled 2012-01-17 (×20): qty 1

## 2012-01-17 MED ORDER — EZETIMIBE 10 MG PO TABS
10.0000 mg | ORAL_TABLET | Freq: Every day | ORAL | Status: DC
  Administered 2012-01-17 – 2012-01-23 (×7): 10 mg via ORAL
  Filled 2012-01-17 (×7): qty 1

## 2012-01-17 MED ORDER — CINACALCET HCL 30 MG PO TABS
60.0000 mg | ORAL_TABLET | Freq: Every day | ORAL | Status: DC
  Administered 2012-01-17: 60 mg via ORAL
  Filled 2012-01-17 (×2): qty 2

## 2012-01-17 MED ORDER — ACETAMINOPHEN 325 MG PO TABS: 650.0000 mg | ORAL_TABLET | Freq: Four times a day (QID) | ORAL | Status: DC | PRN

## 2012-01-17 MED ORDER — FENOFIBRATE 54 MG PO TABS
54.0000 mg | ORAL_TABLET | Freq: Every day | ORAL | Status: DC
  Administered 2012-01-18 – 2012-01-23 (×6): 54 mg via ORAL
  Filled 2012-01-17 (×6): qty 1

## 2012-01-17 MED ORDER — CEFAZOLIN SODIUM-DEXTROSE 2-3 GM-% IV SOLR
INTRAVENOUS | Status: AC
  Filled 2012-01-17: qty 50

## 2012-01-17 MED ORDER — WARFARIN SODIUM 5 MG PO TABS: 5.0000 mg | ORAL_TABLET | Freq: Every day | ORAL | Status: DC

## 2012-01-17 SURGICAL SUPPLY — 52 items
BANDAGE ESMARK 6X9 LF (GAUZE/BANDAGES/DRESSINGS) ×1 IMPLANT
BANDAGE GAUZE ELAST BULKY 4 IN (GAUZE/BANDAGES/DRESSINGS) ×2 IMPLANT
BLADE SAW RECIP 87.9 MT (BLADE) ×2 IMPLANT
BNDG COHESIVE 6X5 TAN STRL LF (GAUZE/BANDAGES/DRESSINGS) ×2 IMPLANT
BNDG ESMARK 6X9 LF (GAUZE/BANDAGES/DRESSINGS) ×2
BNDG GAUZE STRTCH 6 (GAUZE/BANDAGES/DRESSINGS) ×4 IMPLANT
CLOTH BEACON ORANGE TIMEOUT ST (SAFETY) ×2 IMPLANT
COVER SURGICAL LIGHT HANDLE (MISCELLANEOUS) ×2 IMPLANT
CUFF TOURNIQUET SINGLE 34IN LL (TOURNIQUET CUFF) IMPLANT
CUFF TOURNIQUET SINGLE 44IN (TOURNIQUET CUFF) IMPLANT
DRAIN PENROSE 1/2X12 LTX STRL (WOUND CARE) IMPLANT
DRAPE EXTREMITY T 121X128X90 (DRAPE) ×2 IMPLANT
DRAPE PROXIMA HALF (DRAPES) ×4 IMPLANT
DRAPE U-SHAPE 47X51 STRL (DRAPES) ×4 IMPLANT
DRSG ADAPTIC 3X8 NADH LF (GAUZE/BANDAGES/DRESSINGS) ×2 IMPLANT
DRSG PAD ABDOMINAL 8X10 ST (GAUZE/BANDAGES/DRESSINGS) ×2 IMPLANT
DURAPREP 26ML APPLICATOR (WOUND CARE) ×2 IMPLANT
ELECT CAUTERY BLADE 6.4 (BLADE) IMPLANT
ELECT REM PT RETURN 9FT ADLT (ELECTROSURGICAL) ×2
ELECTRODE REM PT RTRN 9FT ADLT (ELECTROSURGICAL) ×1 IMPLANT
EVACUATOR 1/8 PVC DRAIN (DRAIN) IMPLANT
GLOVE BIO SURGEON STRL SZ 6.5 (GLOVE) ×2 IMPLANT
GLOVE BIOGEL PI IND STRL 7.0 (GLOVE) ×1 IMPLANT
GLOVE BIOGEL PI IND STRL 9 (GLOVE) ×1 IMPLANT
GLOVE BIOGEL PI INDICATOR 7.0 (GLOVE) ×1
GLOVE BIOGEL PI INDICATOR 9 (GLOVE) ×1
GLOVE SURG ORTHO 9.0 STRL STRW (GLOVE) ×2 IMPLANT
GOWN PREVENTION PLUS XLARGE (GOWN DISPOSABLE) ×2 IMPLANT
GOWN STRL NON-REIN LRG LVL3 (GOWN DISPOSABLE) ×2 IMPLANT
GOWN STRL REIN XL XLG (GOWN DISPOSABLE) ×2 IMPLANT
KIT BASIN OR (CUSTOM PROCEDURE TRAY) ×2 IMPLANT
KIT ROOM TURNOVER OR (KITS) ×2 IMPLANT
MANIFOLD NEPTUNE II (INSTRUMENTS) ×2 IMPLANT
NS IRRIG 1000ML POUR BTL (IV SOLUTION) ×2 IMPLANT
PACK GENERAL/GYN (CUSTOM PROCEDURE TRAY) ×2 IMPLANT
PAD ARMBOARD 7.5X6 YLW CONV (MISCELLANEOUS) ×4 IMPLANT
PAD CAST 4YDX4 CTTN HI CHSV (CAST SUPPLIES) ×1 IMPLANT
PADDING CAST COTTON 4X4 STRL (CAST SUPPLIES) ×1
PADDING CAST COTTON 6X4 STRL (CAST SUPPLIES) ×2 IMPLANT
SPONGE GAUZE 4X4 12PLY (GAUZE/BANDAGES/DRESSINGS) ×2 IMPLANT
SPONGE LAP 18X18 X RAY DECT (DISPOSABLE) IMPLANT
STAPLER VISISTAT 35W (STAPLE) ×2 IMPLANT
STOCKINETTE IMPERVIOUS LG (DRAPES) IMPLANT
SUT PDS AB 1 CT  36 (SUTURE) ×1
SUT PDS AB 1 CT 36 (SUTURE) ×1 IMPLANT
SUT SILK 2 0 (SUTURE) ×1
SUT SILK 2-0 18XBRD TIE 12 (SUTURE) ×1 IMPLANT
SWAB COLLECTION DEVICE MRSA (MISCELLANEOUS) IMPLANT
TOWEL OR 17X24 6PK STRL BLUE (TOWEL DISPOSABLE) ×2 IMPLANT
TOWEL OR 17X26 10 PK STRL BLUE (TOWEL DISPOSABLE) ×2 IMPLANT
TUBE ANAEROBIC SPECIMEN COL (MISCELLANEOUS) IMPLANT
WATER STERILE IRR 1000ML POUR (IV SOLUTION) ×2 IMPLANT

## 2012-01-17 NOTE — Anesthesia Preprocedure Evaluation (Addendum)
Anesthesia Evaluation  Patient identified by MRN, date of birth, ID band Patient awake    Reviewed: Allergy & Precautions, H&P , NPO status , Patient's Chart, lab work & pertinent test results  History of Anesthesia Complications (+) PROLONGED EMERGENCE  Airway Mallampati: II TM Distance: >3 FB Neck ROM: Full    Dental  (+) Edentulous Upper, Edentulous Lower and Dental Advisory Given   Pulmonary pneumonia -, resolved,    Pulmonary exam normal       Cardiovascular hypertension, + CAD, + Cardiac Stents, + CABG and +CHF     Neuro/Psych PSYCHIATRIC DISORDERS Depression CVA, Residual Symptoms    GI/Hepatic Neg liver ROS, GERD-  Medicated,  Endo/Other  diabetes, Insulin Dependent  Renal/GU DialysisRenal disease     Musculoskeletal   Abdominal   Peds  Hematology   Anesthesia Other Findings   Reproductive/Obstetrics                         Anesthesia Physical Anesthesia Plan  ASA: III  Anesthesia Plan: General   Post-op Pain Management:    Induction: Intravenous  Airway Management Planned: LMA  Additional Equipment:   Intra-op Plan:   Post-operative Plan: Extubation in OR  Informed Consent: I have reviewed the patients History and Physical, chart, labs and discussed the procedure including the risks, benefits and alternatives for the proposed anesthesia with the patient or authorized representative who has indicated his/her understanding and acceptance.   Dental advisory given  Plan Discussed with: CRNA, Anesthesiologist and Surgeon  Anesthesia Plan Comments:        Anesthesia Quick Evaluation

## 2012-01-17 NOTE — Op Note (Signed)
OPERATIVE REPORT  DATE OF SURGERY: 01/17/2012  PATIENT:  Faith [name],  56 y.o. female  PRE-OPERATIVE DIAGNOSIS:  gangrene right BKA  POST-OPERATIVE DIAGNOSIS:  gangrene right BKA  PROCEDURE:  Procedure(s): AMPUTATION ABOVE KNEE right leg  SURGEON:  Surgeon(s): Nadara Mustard, MD  ANESTHESIA:   general  EBL:  min ML  SPECIMEN:right leg   TOURNIQUET:  * No tourniquets in log *  PROCEDURE DETAILS: Patient is a 56 year old woman diabetic end-stage renal disease on dialysis with peripheral vascular disease who presents with gangrene osteomyelitis and dehiscence of the right leg transtibial amputation. Due to failure of limb salvage patient presents at this time for a right above-the-knee amputation. Risks and benefits were discussed including infection neurovascular injury nonhealing of the wound the for higher level amputation. Patient states she understands and wished to proceed at this time. Description of procedure patient was brought to the operating room and underwent a general anesthetic. After adequate levels of anesthesia were obtained patient's right lower extremity was prepped using DuraPrep and draped into a sterile field. A fishmouth incision was made just proximal to the patella. The leg was placed in an impervious stockinette so as to not contaminate the surgical field. Amputation was made through the distal femur the vascular tunnels were suture ligated with 2-0 silk. Hemostasis was obtained the leg was delivered off the field and sent to pathology. The deep and superficial fascial layers were closed using #1 PDS. The skin was closed using staples. The wound was covered with Adaptic orthopedic sponges AB dressing Kerlix and Coban. Patient was extubated taken to the PACU in stable condition.  PLAN OF CARE: Admit to inpatient   PATIENT DISPOSITION:  PACU - hemodynamically stable.   Nadara Mustard, MD 01/17/2012 11:53 AM

## 2012-01-17 NOTE — Anesthesia Postprocedure Evaluation (Signed)
Anesthesia Post Note  Patient: Faith [name]  Procedure(s) Performed: Procedure(s) (LRB): AMPUTATION ABOVE KNEE (Right)  Anesthesia type: general  Patient location: PACU  Post pain: Pain level controlled  Post assessment: Patient's Cardiovascular Status Stable  Last Vitals:  Filed Vitals:   01/17/12 1215  BP: 110/67  Pulse: 82  Temp:   Resp:     Post vital signs: Reviewed and stable  Level of consciousness: sedated  Complications: No apparent anesthesia complications

## 2012-01-17 NOTE — Consult Note (Signed)
[facility] Renal Consultation Note    Indication for Consultation:  Management of ESRD/hemodialysis; anemia, hypertension/volume and secondary hyperparathyroidism  HPI: Faith [name] is a 56 y.o. female with ESRD on MWF HD at [facility] who had BKA last month now presents with gangrene osteomyelitis and dehiscence of right BKA. She is admitted following right AKA amputation and due for HD tomorrow. She states the leg was not oozing and not healing. Was not on any antibiotics PTA.  At present she is having some leg pain, but not bad. No CP or SOB. Has constipation at times.  Past Medical History  Diagnosis Date  . Hyperparathyroidism, secondary renal   . CAD (coronary artery disease) of artery bypass graft     CABG in 2002  . PAD (peripheral artery disease)     Not a candidate for revascularization  . Chronic constipation   . GERD (gastroesophageal reflux disease)   . Anemia of chronic renal failure   . Tardive dyskinesia     Secondary to Reglan  . Depression   . Neuropathy   . Diabetic retinopathy(362.0)   . Diastolic CHF, chronic     Grade 2  . Pulmonary embolism 06/2003  . Hyperlipidemia   . Myocardial infarction 2002  . Stroke 2002    "w/left side" residual weakness  . ESRD on dialysis 01/17/11    [name]; M, W, F; via right upper arm AVF  . Diabetes mellitus     type II  . CHF (congestive heart failure)   . CVA (cerebral infarction)     right brain with minimal left hemiparesis  . Pneumonia 10/2009; 11/2009    ?pneumonia 01/17/11  . Hypertension   . Arthritis   . Complication of anesthesia     "sleepy afterwards; due to her being dialysis pt"   Past Surgical History  Procedure Date  . Coronary artery bypass graft 2002  . Laparoscopic cholecystectomy 2002  . Av fistula placement     times 4-using rt upper arm graft-old rt lower arm,lt upper and lower old grafts  . Trigger finger release   . Tubal ligation   . Eye examination under anesthesia w/  retinal cryotherapy and retinal laser   . Cataract extraction 07/2010    right sided   . Toe amputation 5/12    left 2nd toe secondary to osteomyelitis  . Breast biopsy 06/2010  . Carpal tunnel release 05/05/2011    Procedure: CARPAL TUNNEL RELEASE;  Surgeon: Sharma Covert, MD;  Location: [facility];  Service: Orthopedics;  Laterality: Right;  . Coronary angioplasty with stent placement   . Amputation 11/24/2011    Procedure: AMPUTATION RAY;  Surgeon: Nadara Mustard, MD;  Location[facility] OR;  Service: Orthopedics;  Laterality: Right;  Right Foot 2nd Ray Amputation  . Amputation 12/12/2011    Procedure: AMPUTATION BELOW KNEE;  Surgeon: Nadara Mustard, MD;  Location: [facility] OR;  Service: Orthopedics;  Laterality: Right;   Family History  Problem Relation Age of Onset  . Diabetes Mother   . Cirrhosis Mother   . Diabetes Father   . Heart attack Father   . Hyperlipidemia Father   . Heart disease Father   . Diabetes Sister   . Hyperlipidemia Sister   . Hypertension Sister   . Diabetes Sister   . Diabetes Sister   . Heart disease Brother   . Hyperlipidemia Brother    Social History: [facility]ends [facility] during the day because her husband  works.  reports that she has never smoked. She has never used smokeless tobacco. She reports that she does not drink alcohol or use illicit drugs. Allergies  Allergen Reactions  . Metoclopramide Hcl Other (See Comments)    Tardive dyskinesia  . Codeine Nausea And Vomiting  . Lipitor (Atorvastatin Calcium) Other (See Comments)    Muscle weakness   Prior to Admission medications   Medication Sig Start Date End Date Taking? Authorizing Provider  acetaminophen (TYLENOL ARTHRITIS PAIN) 650 MG CR tablet Take 650 mg by mouth every 8 (eight) hours as needed. For pain   Yes Historical Provider, MD  albuterol (PROVENTIL HFA;VENTOLIN HFA) 108 (90 BASE) MCG/ACT inhaler Inhale 2 puffs into the lungs every 4 (four) hours as needed. For shortness of  breath   Yes Historical Provider, MD  aspirin EC 81 MG tablet Take 81 mg by mouth daily.   Yes Historical Provider, MD  cinacalcet (SENSIPAR) 30 MG tablet Take 30-90 mg by mouth 2 (two) times daily. 1 tab in the am, 2 tabs at bedtime   Yes Historical Provider, MD  ezetimibe (ZETIA) 10 MG tablet Take 10 mg by mouth daily.   Yes Historical Provider, MD  fenofibrate micronized (LOFIBRA) 134 MG capsule Take 134 mg by mouth daily.    Yes Historical Provider, MD  gabapentin (NEURONTIN) 300 MG capsule Take 1 capsule (300 mg total) by mouth at bedtime. Note decreased dose. 01/04/12  Yes Evlyn Kanner Love, PA  guaiFENesin (ROBITUSSIN) 100 MG/5ML SOLN Take 15 mLs by mouth every 6 (six) hours as needed. For coughing/wheezing   Yes Historical Provider, MD  HYDROcodone-acetaminophen (NORCO/VICODIN) 5-325 MG per tablet Take 1 tablet by mouth every 6 (six) hours as needed. For pain 01/04/12  Yes Evlyn Kanner Love, PA  insulin aspart (NOVOLOG) 100 UNIT/ML injection Inject 4-12 Units into the skin 3 (three) times daily before meals. Per sliding scale   Yes Historical Provider, MD  methocarbamol (ROBAXIN) 500 MG tablet Take 500 mg by mouth every 6 (six) hours as needed. For muscle spasms 01/04/12  Yes Evlyn Kanner Love, PA  multivitamin (RENA-VIT) TABS tablet Take 1 tablet by mouth daily.    Yes Historical Provider, MD  nitroGLYCERIN (NITROSTAT) 0.4 MG SL tablet Place 0.4 mg under the tongue every 5 (five) minutes as needed. For chest pain   Yes Historical Provider, MD  pantoprazole (PROTONIX) 40 MG tablet Take 40 mg by mouth at bedtime.    Yes Historical Provider, MD  polyvinyl alcohol (LIQUIFILM TEARS) 1.4 % ophthalmic solution Place 1 drop into both eyes daily as needed. For dryness   Yes Historical Provider, MD  senna-docusate (SENOKOT-S) 8.6-50 MG per tablet Take 1 tablet by mouth 2 (two) times daily. 01/04/12  Yes Evlyn Kanner Love, PA  sevelamer (RENAGEL) 800 MG tablet Take 2-4 tablets (1,600-3,200 mg total) by mouth See admin  instructions. Take 4 tablets with meals and 2 tablets with snacks 01/04/12  Yes Evlyn Kanner Love, PA  tetrahydrozoline 0.05 % ophthalmic solution Place 1 drop into both eyes daily.   Yes Historical Provider, MD  traMADol (ULTRAM) 50 MG tablet Take 50 mg by mouth every 8 (eight) hours as needed. For pain/neuropathy 01/04/12  Yes Evlyn Kanner Love, PA  warfarin (COUMADIN) 5 MG tablet Take 5 mg by mouth daily. 01/04/12  Yes Jacquelynn Cree, PA   Current Facility-Administered Medications  Medication Dose Route Frequency Provider Last Rate Last Dose  . 0.9 %  sodium chloride infusion   Intravenous Continuous Berna Spare  Kandis Mannan, MD      . albuterol (PROVENTIL HFA;VENTOLIN HFA) 108 (90 BASE) MCG/ACT inhaler 2 puff  2 puff Inhalation Q4H PRN Nadara Mustard, MD      . aspirin EC tablet 81 mg  81 mg Oral Daily Nadara Mustard, MD      . cinacalcet [facility]) tablet 30 mg  30 mg Oral Q breakfast Nadara Mustard, MD      . cinacalcet [facility]) tablet 60 mg  60 mg Oral QHS Nadara Mustard, MD      . ezetimibe (ZETIA) tablet 10 mg  10 mg Oral Daily Nadara Mustard, MD      . fenofibrate tablet 54 mg  54 mg Oral Daily Nadara Mustard, MD      . [address] fentaNYL (SUBLIMAZE) injection 50 mcg  50 mcg Intravenous PRN Remonia Richter, MD   50 mcg at 01/17/12 1004  . gabapentin (NEURONTIN) capsule 300 mg  300 mg Oral QHS Nadara Mustard, MD      . guaiFENesin (ROBITUSSIN) 100 MG/5ML solution 300 mg  15 mL Oral Q6H PRN Nadara Mustard, MD      . HYDROcodone-acetaminophen (NORCO/VICODIN) 5-325 MG per tablet 1-2 tablet  1-2 tablet Oral Q4H PRN Nadara Mustard, MD      . HYDROmorphone (DILAUDID) 1 MG/ML injection           . HYDROmorphone (DILAUDID) 1 MG/ML injection           . HYDROmorphone (DILAUDID) injection 0.5-1 mg  0.5-1 mg Intravenous Q2H PRN Nadara Mustard, MD      . insulin aspart (novoLOG) injection 0-9 Units  0-9 Units Subcutaneous TID WC Nadara Mustard, MD      . insulin aspart (novoLOG) injection 3 Units  3 Units Subcutaneous  TID WC Nadara Mustard, MD      . methocarbamol (ROBAXIN) tablet 500 mg  500 mg Oral Q6H PRN Nadara Mustard, MD       Or  . methocarbamol (ROBAXIN) 500 mg in dextrose 5 % 50 mL IVPB  500 mg Intravenous Q6H PRN Nadara Mustard, MD      . multivitamin (RENA-VIT) tablet 1 tablet  1 tablet Oral QHS Nadara Mustard, MD      . mupirocin ointment (BACTROBAN) 2 %           . naphazoline (NAPHCON) 0.1 % ophthalmic solution 1 drop  1 drop Both Eyes QID PRN Nadara Mustard, MD      . nitroGLYCERIN (NITROSTAT) SL tablet 0.4 mg  0.4 mg Sublingual Q5 min PRN Nadara Mustard, MD      . ondansetron [facility]) tablet 4 mg  4 mg Oral Q6H PRN Nadara Mustard, MD       Or  . ondansetron [facility]) injection 4 mg  4 mg Intravenous Q6H PRN Nadara Mustard, MD      . oxyCODONE-acetaminophen (PERCOCET/ROXICET) 5-325 MG per tablet 1-2 tablet  1-2 tablet Oral Q4H PRN Nadara Mustard, MD      . pantoprazole (PROTONIX) EC tablet 40 mg  40 mg Oral QAC lunch Nadara Mustard, MD      . polyvinyl alcohol (LIQUIFILM TEARS) 1.4 % ophthalmic solution 1 drop  1 drop Both Eyes Daily PRN Nadara Mustard, MD      . senna-docusate (Senokot-S) tablet 1 tablet  1 tablet Oral BID Nadara Mustard, MD      . sevelamer (RENVELA)  tablet 800 mg  800 mg Oral TID WC Nadara Mustard, MD      . [DISCONTINUED] 0.9 %  sodium chloride infusion   Intravenous Continuous Remonia Richter, MD 30 mL/hr at 01/17/12 [phone]    . [DISCONTINUED] 0.9 % irrigation (POUR BTL)    PRN Nadara Mustard, MD   1,000 mL at 01/17/12 1139    Labs: Basic Metabolic Panel:  Lab 01/17/12 9604  NA 137  K 3.1*  CL 96  CO2 29  GLUCOSE 87  BUN 8  CREATININE 3.64*  CALCIUM 9.5  ALB --  PHOS --   LCBC:  Lab 01/17/12 0859  WBC 8.9  NEUTROABS --  HGB 10.1*  HCT 33.3*  MCV 79.7  PLT 399   CBG:  Lab 01/17/12 1525 01/17/12 1154 01/17/12 1002 01/17/12 0852  GLUCAP 89 76 77 89   Review of Systems: Limited by drowsiness  Physical Exam: Filed Vitals:   01/17/12 1400 01/17/12 1415  01/17/12 1431 01/17/12 1500  BP:   136/67   Pulse: 81 81 85   Temp:  98.9 F (37.2 C) 98.6 F (37 C)   TempSrc:      Resp: 14 10 12    Height:    5' 4.57" (1.64 m)  Weight:    71.6 kg (157 lb 13.6 oz)  SpO2: 100% 100% 98%      General: Well developed, well nourished, falls asleep during conversation Head: Normocephalic, atraumatic, sclera non-icteric, mucus membranes are moist, mild tardive dyskinesea Neck: Supple. JVD not elevated. Lungs: Clear bilaterally to auscultation without wheezes, rales, or rhonchi. Breathing is unlabored. Heart: RRR with S1 S2. No murmurs, rubs, or gallops appreciated. Abdomen: Soft, non-tender, non-distended with normoactive bowel sounds. No rebound/guarding. No obvious abdominal masses. Lower extremities: left LE without edema or ischemic changes, no open wounds - missing 2nd toe; right AKA wrapped Neuro:  Moves all extremities spontaneously. Psych:  Responds to questions appropriately with a normal affect when awake Dialysis Access: right upper AVGG patent  Dialysis Orders:  [facility] MWF   Prior EDW 72 kg 3.5  hr 2K 2 Ca profile 4 Optiflux 180 right upper AVGG Epo 1400 Hgb was 9.5 11/15 Assessment/Plan: 1.  s/p revision of right BKA to AKA due to gangrene and osteo - no heparin HD 2. ESRD -  MWF per routine - usual orders; K 3.1 today; may need to use 4 K 2.25 bath tomorrow if K still low 3. Hypertension/volume  - volume control; no meds 4. Anemia  - Hgb pre op 10.1; re-eval Hgb in am; dose ARanesp 12.5 5. Metabolic bone disease -  Continue renagel, and sensipar 2 Ca bath; zemplar has been on hold due to hypercalcemia since last hospitalization; monitor Ca/P while here 6. Nutrition - high protein renal diet 7. Type 2 DM - per primary 8. Hx CAD/hyperlipidemia s/p CABG 2002 - on zetia and baby asa 9. Hx PVD - s/p CVA with residual right sided hemiparesis  Sheffield Slider, PA-C [facility] Beeper [phone] 01/17/2012, 3:51 PM    Renal  Attending Note: I have personally evaluated the patient's history, laboratory studies and performed a physical exam and agree with plans as detailed above. Arneta Mahmood C

## 2012-01-17 NOTE — H&P (Signed)
Faith [name] is an 56 y.o. female.   Chief Complaint: Dehiscence gangrene right transtibial amputation HPI: Patient is a 56 year old woman with peripheral vascular disease diabetes end-stage renal disease on dialysis who is status post a transtibial amputation. She has had progressive gangrenous changes has failed conservative therapy and presents at this time for above-the-knee amputation.  Past Medical History  Diagnosis Date  . Hyperparathyroidism, secondary renal   . CAD (coronary artery disease) of artery bypass graft     CABG in 2002  . PAD (peripheral artery disease)     Not a candidate for revascularization  . Chronic constipation   . GERD (gastroesophageal reflux disease)   . Anemia of chronic renal failure   . Tardive dyskinesia     Secondary to Reglan  . Depression   . Neuropathy   . Diabetic retinopathy(362.0)   . Diastolic CHF, chronic     Grade 2  . Pulmonary embolism 06/2003  . Hyperlipidemia   . Myocardial infarction 2002  . Stroke 2002    "w/left side" residual weakness  . ESRD on dialysis 01/17/11    [name]; M, W, F; via right upper arm AVF  . Diabetes mellitus     type II  . CHF (congestive heart failure)   . CVA (cerebral infarction)     right brain with minimal left hemiparesis  . Pneumonia 10/2009; 11/2009    ?pneumonia 01/17/11  . Hypertension   . Arthritis   . Complication of anesthesia     "sleepy afterwards; due to her being dialysis pt"    Past Surgical History  Procedure Date  . Coronary artery bypass graft 2002  . Laparoscopic cholecystectomy 2002  . Av fistula placement     times 4-using rt upper arm graft-old rt lower arm,lt upper and lower old grafts  . Trigger finger release   . Tubal ligation   . Eye examination under anesthesia w/ retinal cryotherapy and retinal laser   . Cataract extraction 07/2010    right sided   . Toe amputation 5/12    left 2nd toe secondary to osteomyelitis  . Breast biopsy 06/2010  . Carpal tunnel  release 05/05/2011    Procedure: CARPAL TUNNEL RELEASE;  Surgeon: Sharma Covert, MD;  Location: [facility];  Service: Orthopedics;  Laterality: Right;  . Coronary angioplasty with stent placement   . Amputation 11/24/2011    Procedure: AMPUTATION RAY;  Surgeon: Nadara Mustard, MD;  Location: [facility] OR;  Service: Orthopedics;  Laterality: Right;  Right Foot 2nd Ray Amputation  . Amputation 12/12/2011    Procedure: AMPUTATION BELOW KNEE;  Surgeon: Nadara Mustard, MD;  Location: [facility] OR;  Service: Orthopedics;  Laterality: Right;    Family History  Problem Relation Age of Onset  . Diabetes Mother   . Cirrhosis Mother   . Diabetes Father   . Heart attack Father   . Hyperlipidemia Father   . Heart disease Father   . Diabetes Sister   . Hyperlipidemia Sister   . Hypertension Sister   . Diabetes Sister   . Diabetes Sister   . Heart disease Brother   . Hyperlipidemia Brother    Social History:  reports that she has never smoked. She has never used smokeless tobacco. She reports that she does not drink alcohol or use illicit drugs.  Allergies:  Allergies  Allergen Reactions  . Metoclopramide Hcl Other (See Comments)    Tardive dyskinesia  . Codeine Nausea And Vomiting  .  Lipitor (Atorvastatin Calcium) Other (See Comments)    Muscle weakness    Medications Prior to Admission  Medication Sig Dispense Refill  . acetaminophen (TYLENOL ARTHRITIS PAIN) 650 MG CR tablet Take 650 mg by mouth every 8 (eight) hours as needed. For pain      . albuterol (PROVENTIL HFA;VENTOLIN HFA) 108 (90 BASE) MCG/ACT inhaler Inhale 2 puffs into the lungs every 4 (four) hours as needed. For shortness of breath      . aspirin EC 81 MG tablet Take 81 mg by mouth daily.      . cinacalcet (SENSIPAR) 30 MG tablet Take 30-90 mg by mouth 2 (two) times daily. 1 tab in the am, 2 tabs at bedtime      . ezetimibe (ZETIA) 10 MG tablet Take 10 mg by mouth daily.      . fenofibrate micronized (LOFIBRA) 134 MG  capsule Take 134 mg by mouth daily.       Marland Kitchen gabapentin (NEURONTIN) 300 MG capsule Take 1 capsule (300 mg total) by mouth at bedtime. Note decreased dose.  300 capsule  1  . guaiFENesin (ROBITUSSIN) 100 MG/5ML SOLN Take 15 mLs by mouth every 6 (six) hours as needed. For coughing/wheezing      . HYDROcodone-acetaminophen (NORCO/VICODIN) 5-325 MG per tablet Take 1 tablet by mouth every 6 (six) hours as needed. For pain      . insulin aspart (NOVOLOG) 100 UNIT/ML injection Inject 4-12 Units into the skin 3 (three) times daily before meals. Per sliding scale      . methocarbamol (ROBAXIN) 500 MG tablet Take 500 mg by mouth every 6 (six) hours as needed. For muscle spasms      . multivitamin (RENA-VIT) TABS tablet Take 1 tablet by mouth daily.       . nitroGLYCERIN (NITROSTAT) 0.4 MG SL tablet Place 0.4 mg under the tongue every 5 (five) minutes as needed. For chest pain      . pantoprazole (PROTONIX) 40 MG tablet Take 40 mg by mouth at bedtime.       . polyvinyl alcohol (LIQUIFILM TEARS) 1.4 % ophthalmic solution Place 1 drop into both eyes daily as needed. For dryness      . senna-docusate (SENOKOT-S) 8.6-50 MG per tablet Take 1 tablet by mouth 2 (two) times daily.      . sevelamer (RENAGEL) 800 MG tablet Take 2-4 tablets (1,600-3,200 mg total) by mouth See admin instructions. Take 4 tablets with meals and 2 tablets with snacks      . tetrahydrozoline 0.05 % ophthalmic solution Place 1 drop into both eyes daily.      . traMADol (ULTRAM) 50 MG tablet Take 50 mg by mouth every 8 (eight) hours as needed. For pain/neuropathy      . warfarin (COUMADIN) 5 MG tablet Take 5 mg by mouth daily.        Results for orders placed during the hospital encounter of 01/17/12 (from the past 48 hour(s))  GLUCOSE, CAPILLARY     Status: Normal   Collection Time   01/17/12  8:52 AM      Component Value Range Comment   Glucose-Capillary 89  70 - 99 mg/dL   BASIC METABOLIC PANEL     Status: Abnormal   Collection Time    01/17/12  8:59 AM      Component Value Range Comment   Sodium 137  135 - 145 mEq/L    Potassium 3.1 (*) 3.5 - 5.1 mEq/L    Chloride 96  96 - 112 mEq/L    CO2 29  19 - 32 mEq/L    Glucose, Bld 87  70 - 99 mg/dL    BUN 8  6 - 23 mg/dL    Creatinine, Ser 1.61 (*) 0.50 - 1.10 mg/dL    Calcium 9.5  8.4 - 09.6 mg/dL    GFR calc non Af Amer 13 (*) >90 mL/min    GFR calc Af Amer 15 (*) >90 mL/min   CBC     Status: Abnormal   Collection Time   01/17/12  8:59 AM      Component Value Range Comment   WBC 8.9  4.0 - 10.5 K/uL    RBC 4.18  3.87 - 5.11 MIL/uL    Hemoglobin 10.1 (*) 12.0 - 15.0 g/dL    HCT 04.5 (*) 40.9 - 46.0 %    MCV 79.7  78.0 - 100.0 fL    MCH 24.2 (*) 26.0 - 34.0 pg    MCHC 30.3  30.0 - 36.0 g/dL    RDW 81.1 (*) 91.4 - 15.5 %    Platelets 399  150 - 400 K/uL   PROTIME-INR     Status: Abnormal   Collection Time   01/17/12  8:59 AM      Component Value Range Comment   Prothrombin Time 16.2 (*) 11.6 - 15.2 seconds    INR 1.33  0.00 - 1.49   APTT     Status: Abnormal   Collection Time   01/17/12  8:59 AM      Component Value Range Comment   aPTT 51 (*) 24 - 37 seconds   GLUCOSE, CAPILLARY     Status: Normal   Collection Time   01/17/12 10:02 AM      Component Value Range Comment   Glucose-Capillary 77  70 - 99 mg/dL    Comment 1 Call MD NNP PA CNM      Comment 2 Documented in Chart      No results found.  Review of Systems  All other systems reviewed and are negative.    Blood pressure 167/75, pulse 86, temperature 98.5 F (36.9 C), temperature source Oral, resp. rate 20, SpO2 100.00%. Physical Exam  On examination patient has exposed bone dehiscence and gangrene of a right transtibial amputation. 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  Plan: Will plan for revision to above-the-knee amputation. Risks and benefits were discussed including infection nonhealing of the wound need for higher level amputation.  Patient states she understands and wished to proceed at this time.  Mikhail Hallenbeck V 01/17/2012, 10:45 AM

## 2012-01-17 NOTE — Anesthesia Procedure Notes (Signed)
Procedure Name: LMA Insertion Date/Time: 01/17/2012 11:00 AM Performed by: Arlice Colt B Pre-anesthesia Checklist: Patient identified, Emergency Drugs available, Suction available, Patient being monitored and Timeout performed Patient Re-evaluated:Patient Re-evaluated prior to inductionOxygen Delivery Method: Circle system utilized Preoxygenation: Pre-oxygenation with 100% oxygen Intubation Type: IV induction LMA: LMA with gastric port inserted LMA Size: 4.0 Number of attempts: 1 Placement Confirmation: positive ETCO2 and breath sounds checked- equal and bilateral Tube secured with: Tape Dental Injury: Teeth and Oropharynx as per pre-operative assessment

## 2012-01-17 NOTE — Transfer of Care (Signed)
Immediate Anesthesia Transfer of Care Note  Patient: Faith [name]  Procedure(s) Performed: Procedure(s) (LRB) with comments: AMPUTATION ABOVE KNEE (Right) - Right Above Knee Amputation  Patient Location: PACU  Anesthesia Type:General  Level of Consciousness: responds to stimulation  Airway & Oxygen Therapy: Patient Spontanous Breathing and Patient connected to nasal cannula oxygen  Post-op Assessment: Report given to PACU RN and Post -op Vital signs reviewed and stable  Post vital signs: Reviewed and stable  Complications: No apparent anesthesia complications

## 2012-01-18 ENCOUNTER — Encounter (HOSPITAL_COMMUNITY): Payer: Self-pay | Admitting: General Practice

## 2012-01-18 LAB — PROTIME-INR: INR: 1.48 (ref 0.00–1.49)

## 2012-01-18 LAB — RENAL FUNCTION PANEL
Albumin: 2.6 g/dL — ABNORMAL LOW (ref 3.5–5.2)
BUN: 13 mg/dL (ref 6–23)
CO2: 28 mEq/L (ref 19–32)
Calcium: 9.5 mg/dL (ref 8.4–10.5)
Chloride: 97 mEq/L (ref 96–112)
Creatinine, Ser: 4.74 mg/dL — ABNORMAL HIGH (ref 0.50–1.10)
GFR calc Af Amer: 11 mL/min — ABNORMAL LOW (ref >90)
GFR calc non Af Amer: 9 mL/min — ABNORMAL LOW (ref >90)
Glucose, Bld: 126 mg/dL — ABNORMAL HIGH (ref 70–99)
Phosphorus: 3.5 mg/dL (ref 2.3–4.6)
Potassium: 3.2 mEq/L — ABNORMAL LOW (ref 3.5–5.1)
Sodium: 136 mEq/L (ref 135–145)

## 2012-01-18 LAB — CBC
HCT: 29.3 % — ABNORMAL LOW (ref 36.0–46.0)
Hemoglobin: 9 g/dL — ABNORMAL LOW (ref 12.0–15.0)
MCH: 24.1 pg — ABNORMAL LOW (ref 26.0–34.0)
MCHC: 30.7 g/dL (ref 30.0–36.0)
MCV: 78.3 fL (ref 78.0–100.0)
Platelets: 393 10*3/uL (ref 150–400)
RBC: 3.74 MIL/uL — ABNORMAL LOW (ref 3.87–5.11)
RDW: 18.4 % — ABNORMAL HIGH (ref 11.5–15.5)
WBC: 9.5 10*3/uL (ref 4.0–10.5)

## 2012-01-18 LAB — GLUCOSE, CAPILLARY
Glucose-Capillary: 126 mg/dL — ABNORMAL HIGH (ref 70–99)
Glucose-Capillary: 112 mg/dL — ABNORMAL HIGH (ref 70–99)

## 2012-01-18 MED ORDER — SODIUM CHLORIDE 0.9 % IV SOLN: 100.0000 mL | INTRAVENOUS | Status: DC | PRN

## 2012-01-18 MED ORDER — WARFARIN SODIUM 7.5 MG PO TABS
7.5000 mg | ORAL_TABLET | Freq: Once | ORAL | Status: AC
  Administered 2012-01-18: 7.5 mg via ORAL
  Filled 2012-01-18: qty 1

## 2012-01-18 MED ORDER — CINACALCET HCL 30 MG PO TABS
90.0000 mg | ORAL_TABLET | Freq: Every day | ORAL | Status: DC
  Administered 2012-01-18 – 2012-01-22 (×5): 90 mg via ORAL
  Filled 2012-01-18 (×6): qty 3

## 2012-01-18 MED ORDER — LIDOCAINE HCL (PF) 1 % IJ SOLN: 5.0000 mL | INTRAMUSCULAR | Status: DC | PRN

## 2012-01-18 MED ORDER — LIDOCAINE-PRILOCAINE 2.5-2.5 % EX CREA: 1.0000 "application " | TOPICAL_CREAM | CUTANEOUS | Status: DC | PRN

## 2012-01-18 MED ORDER — DARBEPOETIN ALFA-POLYSORBATE 25 MCG/0.42ML IJ SOLN
INTRAMUSCULAR | Status: AC
  Filled 2012-01-18: qty 0.42

## 2012-01-18 MED ORDER — HYDROMORPHONE HCL PF 1 MG/ML IJ SOLN
INTRAMUSCULAR | Status: AC
  Filled 2012-01-18: qty 1

## 2012-01-18 MED ORDER — PENTAFLUOROPROP-TETRAFLUOROETH EX AERO: 1.0000 "application " | INHALATION_SPRAY | CUTANEOUS | Status: DC | PRN

## 2012-01-18 MED FILL — Mupirocin Oint 2%: CUTANEOUS | Qty: 22 | Status: AC

## 2012-01-18 NOTE — Evaluation (Signed)
Physical Therapy Evaluation Patient Details Name: Faith [name] MRN: [id number] DOB: 29-Jun-1955 Today's Date: 01/18/2012 Time: 4098-1191 PT Time Calculation (min): 32 min  PT Assessment / Plan / Recommendation Clinical Impression  Pt is a 56 y/o female s/p AKA of R LE. Pt had prior BKA of same LE and has required assistance with mobiliy since.  Pt reports spending the days at [facility] program and evenings with her spouse.  Pt appears to be functioning near her baseline level of function and should be able to return to her home with assistance as prior to AKA.  Acute PT will follow pt to progress activity tolerance.     PT Assessment  Patient needs continued PT services    Follow Up Recommendations  Home health PT;Supervision/Assistance - 24 hour    Does the patient have the potential to tolerate intense rehabilitation      Barriers to Discharge None      Equipment Recommendations  None recommended by PT    Recommendations for Other Services     Frequency Min 3X/week    Precautions / Restrictions Precautions Precautions: Fall Precaution Comments: s/p CVA with L hemiparesis (10 years ago) Restrictions Weight Bearing Restrictions: Yes RLE Weight Bearing: Non weight bearing   Pertinent Vitals/Pain 8/10 pain in R residual limb RN medicated pt during session.       Mobility  Bed Mobility Bed Mobility: Supine to Sit;Sit to Supine Rolling Right: Not tested (comment) Rolling Left: Not tested (comment) Right Sidelying to Sit: Not tested (comment) Supine to Sit: 3: Mod assist Sitting - Scoot to Edge of Bed: 6: Modified independent (Device/Increase time) Sit to Supine: 3: Mod assist Details for Bed Mobility Assistance: Assist to manage LEs secondary to pain in R residual limb.  Transfers Transfers: Sit to Stand;Stand to Sit;Stand Pivot Transfers Sit to Stand: 1: +2 Total assist Sit to Stand: Patient Percentage: 30% Stand to Sit: 1: +2 Total assist Stand to Sit: Patient  Percentage: 40% Stand Pivot Transfers: 1: +2 Total assist Stand Pivot Transfers: Patient Percentage: 20% Details for Transfer Assistance: Pt unable to complete stand pivot transfer secondary to weakness in L LE.  Pt later informed me that she was unable to stand pivot transfer prior to AKA and relied on slide board transfers.  Ambulation/Gait Ambulation/Gait Assistance: Not tested (comment) Wheelchair Mobility Wheelchair Mobility: No    Shoulder Instructions     Exercises     PT Diagnosis: Generalized weakness;Acute pain  PT Problem List: Decreased strength;Decreased activity tolerance;Decreased mobility;Pain PT Treatment Interventions: Functional mobility training;Therapeutic activities;Therapeutic exercise;Patient/family education;Wheelchair mobility training   PT Goals Acute Rehab PT Goals PT Goal Formulation: With patient Time For Goal Achievement: 02/01/12 Potential to Achieve Goals: Fair  Visit Information  Last PT Received On: 01/18/12 Assistance Needed: +2    Subjective Data  Subjective: agree to PT eval Patient Stated Goal: Return to home with spouse.    Prior Functioning  Home Living Lives With: Spouse Available Help at Discharge: Personal care attendant;Available PRN/intermittently;Family;Other (Comment) Type of Home: House Home Access: Level entry Home Layout: One level Bathroom Shower/Tub: Tub/shower unit;C[employer]ain [employer]fighter: Standard Bathroom Accessibility: Yes How Accessible: Accessible via walker Home Adaptive Equipment: Wheelchair - powered;Wheelchair - manual;Walker - rolling Additional Comments: has RW and 4WW; used RW and manual w/c indoors; rarely power w/c in community with family Prior Function Level of Independence: Needs assistance Needs Assistance: Transfers Transfer Assistance: assist for slide board transfers.  Able to Take Stairs?: No Driving: No Vocation: On disability  Comments: Uses wheelchair for mobility, slide board transfer  with assistance, [facility] program  (unsure of days) HD MWF.   Communication Communication: No difficulties Dominant Hand: Right    Cognition  Overall Cognitive Status: Appears within functional limits for tasks assessed/performed Arousal/Alertness: Awake/alert Orientation Level: Appears intact for tasks assessed Behavior During Session: [facility] for tasks performed    Extremity/Trunk Assessment Right Upper Extremity Assessment RUE ROM/Strength/Tone: [facility] for tasks assessed Left Upper Extremity Assessment LUE ROM/Strength/Tone: WFL for tasks assessed Right Lower Extremity Assessment RLE ROM/Strength/Tone: Unable to fully assess RLE Sensation: WFL - Light Touch Left Lower Extremity Assessment LLE ROM/Strength/Tone: Deficits LLE ROM/Strength/Tone Deficits: Generalized weakness    Balance Balance Balance Assessed: Yes Static Sitting Balance Static Sitting - Balance Support: Feet unsupported Static Sitting - Level of Assistance: 4: Min assist Static Sitting - Comment/# of Minutes: 2 minutes on EOB with intermittent assist secondary to posterior lean.   End of Session PT - End of Session Equipment Utilized During Treatment: Gait belt Activity Tolerance: Patient limited by fatigue;Patient limited by pain Patient left: in bed;with call bell/phone within reach Nurse Communication: Mobility status  GP     Josalin Carneiro 01/18/2012, 3:43 PM  Haskel Dewalt L. Ayeisha Lindenberger DPT [phone]

## 2012-01-18 NOTE — Progress Notes (Signed)
Assessment/Plan:  1. ESRD - MWF per routine - currently in progress on 4K        2.  s/p revision of right BKA to AKA due to gangrene and osteo - no heparin HD  Subjective: Interval History: Pain is tolerable  Objective: Vital signs in last 24 hours: Temp:  [98 F (36.7 C)-99.7 F (37.6 C)] 99.4 F (37.4 C) (11/27 0625) Pulse Rate:  [70-88] 80  (11/27 0830) Resp:  [10-20] 18  (11/27 0625) BP: (100-136)/(53-67) 121/58 mmHg (11/27 0830) SpO2:  [86 %-100 %] 98 % (11/27 0625) Weight:  [71 kg (156 lb 8.4 oz)-71.6 kg (157 lb 13.6 oz)] 71 kg (156 lb 8.4 oz) (11/27 0625) Weight change:   Intake/Output from previous day: 11/26 0701 - 11/27 0700 In: 410 [P.O.:60; I.V.:300] Out: 250 [Blood:250] Intake/Output this shift:   General appearance: alert and cooperative Resp: clear to auscultation bilaterally Chest wall: no tenderness Cardio: regular rate and rhythm, S1, S2 normal, no murmur, click, rub or gallop GI: soft, non-tender; bowel sounds normal; no masses,  no organomegaly Extremities: right AKA stump wrapped  Lab Results:  Basename 01/18/12 0700 01/17/12 0859  WBC 9.5 8.9  HGB 9.0* 10.1*  HCT 29.3* 33.3*  PLT 393 399   BMET:  Basename 01/18/12 0651 01/17/12 0859  NA 136 137  K 3.2* 3.1*  CL 97 96  CO2 28 29  GLUCOSE 126* 87  BUN 13 8  CREATININE 4.74* 3.64*  CALCIUM 9.5 9.5   No results found for this basename: PTH:2 in the last 72 hours Iron Studies: No results found for this basename: IRON,TIBC,TRANSFERRIN,FERRITIN in the last 72 hours Studies/Results: No results found.  Scheduled:   . aspirin EC  81 mg Oral Daily  . cinacalcet  30 mg Oral Q breakfast  . cinacalcet  60 mg Oral QHS  . coumadin book   Does not apply Once  . darbepoetin (ARANESP) injection - DIALYSIS  12.5 mcg Intravenous Q Wed-HD  . ezetimibe  10 mg Oral Daily  . fenofibrate  54 mg Oral Daily  . gabapentin  300 mg Oral QHS  . [EXPIRED] HYDROmorphone      . [EXPIRED] HYDROmorphone      .  HYDROmorphone      . insulin aspart  0-9 Units Subcutaneous TID WC  . insulin aspart  3 Units Subcutaneous TID WC  . multivitamin  1 tablet Oral QHS  . [EXPIRED] mupirocin ointment      . pantoprazole  40 mg Oral QAC lunch  . senna-docusate  1 tablet Oral BID  . sevelamer  800 mg Oral TID WC  . [COMPLETED] warfarin  5 mg Oral ONCE-1800  . [COMPLETED] warfarin   Does not apply Once  . Warfarin - Pharmacist Dosing Inpatient   Does not apply q1800  . [DISCONTINUED]  ceFAZolin (ANCEF) IV  1 g Intravenous Q6H  . [DISCONTINUED] cinacalcet  30-90 mg Oral BID  . [DISCONTINUED] warfarin  5 mg Oral Daily     LOS: 1 day   Faith [name] C 01/18/2012,9:03 AM

## 2012-01-18 NOTE — Progress Notes (Signed)
Patient ID: Faith [name], female   DOB: [birth year], 56 y.o.   MRN: [id number] Postoperative day 1 right above-the-knee amputation. Patient and hemodialysis today. Plan for discharge to home or skilled nursing depending on her ability for transfers.

## 2012-01-18 NOTE — Progress Notes (Signed)
ANTICOAGULATION CONSULT NOTE - Initial Consult  Pharmacy Consult for coumadin Indication: h/o PE  Allergies  Allergen Reactions  . Metoclopramide Hcl Other (See Comments)    Tardive dyskinesia  . Codeine Nausea And Vomiting  . Lipitor (Atorvastatin Calcium) Other (See Comments)    Muscle weakness    Patient Measurements: Height: 5' 4.57" (164 cm) (12/12/11) Weight: 153 lb 7 oz (69.6 kg) IBW/kg (Calculated) : 56    Vital Signs: Temp: 99.4 F (37.4 C) (11/27 0625) Temp src: Oral (11/27 0625) BP: 137/77 mmHg (11/27 1020) Pulse Rate: 89  (11/27 1020)  Labs:  Basename 01/18/12 0700 01/18/12 0651 01/17/12 0859  HGB 9.0* -- 10.1*  HCT 29.3* -- 33.3*  PLT 393 -- 399  APTT -- -- 51*  LABPROT 17.5* -- 16.2*  INR 1.48 -- 1.33  HEPARINUNFRC -- -- --  CREATININE -- 4.74* 3.64*  CKTOTAL -- -- --  CKMB -- -- --  TROPONINI -- -- --    Estimated Creatinine Clearance: 12.7 ml/min (by C-G formula based on Cr of 4.74).   Medical History: Past Medical History  Diagnosis Date  . Hyperparathyroidism, secondary renal   . CAD (coronary artery disease) of artery bypass graft     CABG in 2002  . PAD (peripheral artery disease)     Not a candidate for revascularization  . Chronic constipation   . GERD (gastroesophageal reflux disease)   . Anemia of chronic renal failure   . Tardive dyskinesia     Secondary to Reglan  . Depression   . Neuropathy   . Diabetic retinopathy(362.0)   . Diastolic CHF, chronic     Grade 2  . Pulmonary embolism 06/2003  . Hyperlipidemia   . Myocardial infarction 2002  . Stroke 2002    "w/left side" residual weakness  . ESRD on dialysis 01/17/11    [name]; M, W, F; via right upper arm AVF  . Diabetes mellitus     type II  . CHF (congestive heart failure)   . CVA (cerebral infarction)     right brain with minimal left hemiparesis  . Pneumonia 10/2009; 11/2009    ?pneumonia 01/17/11  . Hypertension   . Arthritis   . Complication of  anesthesia     "sleepy afterwards; due to her being dialysis pt"    Medications:  Prescriptions prior to admission  Medication Sig Dispense Refill  . acetaminophen (TYLENOL ARTHRITIS PAIN) 650 MG CR tablet Take 650 mg by mouth every 8 (eight) hours as needed. For pain      . albuterol (PROVENTIL HFA;VENTOLIN HFA) 108 (90 BASE) MCG/ACT inhaler Inhale 2 puffs into the lungs every 4 (four) hours as needed. For shortness of breath      . aspirin EC 81 MG tablet Take 81 mg by mouth daily.      . cinacalcet (SENSIPAR) 30 MG tablet Take 30 mg by mouth daily. Takes 1 tablet (30 mg) every AM (and 90mg  (1 tablet)  every evening.      . ezetimibe (ZETIA) 10 MG tablet Take 10 mg by mouth daily.      . fenofibrate micronized (LOFIBRA) 134 MG capsule Take 134 mg by mouth daily.       Marland Kitchen gabapentin (NEURONTIN) 300 MG capsule Take 1 capsule (300 mg total) by mouth at bedtime. Note decreased dose.  300 capsule  1  . guaiFENesin (ROBITUSSIN) 100 MG/5ML SOLN Take 15 mLs by mouth every 6 (six) hours as needed. For coughing/wheezing      .  HYDROcodone-acetaminophen (NORCO/VICODIN) 5-325 MG per tablet Take 1 tablet by mouth every 6 (six) hours as needed. For pain      . insulin aspart (NOVOLOG) 100 UNIT/ML injection Inject 4-12 Units into the skin 3 (three) times daily before meals. Per sliding scale      . methocarbamol (ROBAXIN) 500 MG tablet Take 500 mg by mouth every 6 (six) hours as needed. For muscle spasms      . multivitamin (RENA-VIT) TABS tablet Take 1 tablet by mouth daily.       . nitroGLYCERIN (NITROSTAT) 0.4 MG SL tablet Place 0.4 mg under the tongue every 5 (five) minutes as needed. For chest pain      . pantoprazole (PROTONIX) 40 MG tablet Take 40 mg by mouth at bedtime.       . polyvinyl alcohol (LIQUIFILM TEARS) 1.4 % ophthalmic solution Place 1 drop into both eyes daily as needed. For dryness      . senna-docusate (SENOKOT-S) 8.6-50 MG per tablet Take 1 tablet by mouth 2 (two) times daily.      .  sevelamer (RENAGEL) 800 MG tablet Take 2-4 tablets (1,600-3,200 mg total) by mouth See admin instructions. Take 4 tablets with meals and 2 tablets with snacks      . tetrahydrozoline 0.05 % ophthalmic solution Place 1 drop into both eyes daily.      . traMADol (ULTRAM) 50 MG tablet Take 50 mg by mouth every 8 (eight) hours as needed. For pain/neuropathy      . warfarin (COUMADIN) 5 MG tablet Take 5 mg by mouth daily.        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  Home dose of coumadin was 5mg  daily. 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 Now POD #1 s/p right AKA.   PTA dose of coumadin was 5mg  daily with last dose some time in the past week.  Admit INR =1.33 yesterday. We gave her 5mg  dose last night. Today INR is 1.48.   No bleeding noted.  H/H 9.0/29.3,  pltc 393K.  Hemodialyzed today. No heparin in HD   Goal of Therapy:  INR 2-3 Monitor platelets by anticoagulation protocol: Yes   Plan:  Coumadin 7.5mg  today x 1 for goal INR 2-3 INR qAM  Noah Delaine, RPh Clinical Pharmacist Pager: [phone]  01/18/2012,1:29 PM

## 2012-01-19 LAB — PROTIME-INR: Prothrombin Time: 23.3 seconds — ABNORMAL HIGH (ref 11.6–15.2)

## 2012-01-19 LAB — GLUCOSE, CAPILLARY: Glucose-Capillary: 134 mg/dL — ABNORMAL HIGH (ref 70–99)

## 2012-01-19 MED ORDER — WARFARIN SODIUM 5 MG PO TABS
5.0000 mg | ORAL_TABLET | Freq: Once | ORAL | Status: AC
  Administered 2012-01-19: 5 mg via ORAL
  Filled 2012-01-19: qty 1

## 2012-01-19 MED ORDER — SORBITOL 70 % SOLN: 30.0000 mL | Freq: Every day | Status: DC | PRN

## 2012-01-19 NOTE — Progress Notes (Signed)
ANTICOAGULATION CONSULT NOTE - Follow Up Consult  Pharmacy Consult for Coumadin  Indication: H/o PE  Allergies  Allergen Reactions  . Lipitor (Atorvastatin Calcium) Other (See Comments)    Muscle weakness  . Codeine Nausea And Vomiting  . Metoclopramide Hcl Other (See Comments)    Tardive dyskinesia    Patient Measurements: Height: 5' 4.57" (164 cm) (12/12/11) Weight: 153 lb 7 oz (69.6 kg) IBW/kg (Calculated) : 56    Vital Signs: Temp: 98.6 F (37 C) (11/28 0447) Temp src: Oral (11/28 0447) BP: 138/69 mmHg (11/28 0447) Pulse Rate: 88  (11/28 0447)  Labs:  Basename 01/19/12 0527 01/18/12 0700 01/18/12 0651 01/17/12 0859  HGB -- 9.0* -- 10.1*  HCT -- 29.3* -- 33.3*  PLT -- 393 -- 399  APTT -- -- -- 51*  LABPROT 23.3* 17.5* -- 16.2*  INR 2.18* 1.48 -- 1.33  HEPARINUNFRC -- -- -- --  CREATININE -- -- 4.74* 3.64*  CKTOTAL -- -- -- --  CKMB -- -- -- --  TROPONINI -- -- -- --    Estimated Creatinine Clearance: 12.7 ml/min (by C-G formula based on Cr of 4.74).   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 Home dose of coumadin was 5mg  daily. 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 Now POD #2 s/p right AKA.   PTA dose of coumadin was 5mg  daily with last dose some time in the past week.   Admit INR 1.33, now in therapeutic range at 2.18 (after 5mg  and 7.5mg ) No bleeding.  Labs 11/27: post-op H/H 9.0/29.3, pltc 393K. No HD today d/t holiday, next HD scheduled for Sat 11/29. No heparin in HD    Goal of Therapy:  INR 2-3 Monitor platelets by anticoagulation protocol: Yes   Plan:  Coumadin 5mg  x1 INR qAM CBC at least q72 hrs Monitor for s/x bleeding   Thank you for the consult.  Tomi Bamberger, PharmD Clinical Pharmacist Pager: [phone] Pharmacy: [phone] 01/19/2012 2:36 PM

## 2012-01-19 NOTE — Progress Notes (Signed)
S:Some pain in Rt stump.  Eating fair O:BP 138/69  Pulse 88  Temp 98.6 F (37 C) (Oral)  Resp 16  Ht 5' 4.57" (1.64 m)  Wt 69.6 kg (153 lb 7 oz)  BMI 25.88 kg/m2  SpO2 100%  Intake/Output Summary (Last 24 hours) at 01/19/12 0916 Last data filed at 01/19/12 0447  Gross per 24 hour  Intake     85 ml  Output   1499 ml  Net  -1414 ml   Weight change: -2 kg (-4 lb 6.6 oz) ZOX:WRUEA and alert CVS:RRR Resp:Clear Abd:+BS NTND Ext:No edema on lt  Rt stump bandaged.  + bruit RUA avg NEURO:CNI Ox3      . aspirin EC  81 mg Oral Daily  . cinacalcet  30 mg Oral Q breakfast  . cinacalcet  90 mg Oral QHS  . coumadin book   Does not apply Once  . [COMPLETED] darbepoetin      . darbepoetin (ARANESP) injection - DIALYSIS  12.5 mcg Intravenous Q Wed-HD  . ezetimibe  10 mg Oral Daily  . fenofibrate  54 mg Oral Daily  . gabapentin  300 mg Oral QHS  . [COMPLETED] HYDROmorphone      . insulin aspart  0-9 Units Subcutaneous TID WC  . insulin aspart  3 Units Subcutaneous TID WC  . multivitamin  1 tablet Oral QHS  . pantoprazole  40 mg Oral QAC lunch  . senna-docusate  1 tablet Oral BID  . sevelamer  800 mg Oral TID WC  . [COMPLETED] warfarin  7.5 mg Oral ONCE-1800  . Warfarin - Pharmacist Dosing Inpatient   Does not apply q1800  . [DISCONTINUED] cinacalcet  60 mg Oral QHS   No results found. BMET    Component Value Date/Time   NA 136 01/18/2012 0651   K 3.2* 01/18/2012 0651   CL 97 01/18/2012 0651   CO2 28 01/18/2012 0651   GLUCOSE 126* 01/18/2012 0651   BUN 13 01/18/2012 0651   CREATININE 4.74* 01/18/2012 0651   CALCIUM 9.5 01/18/2012 0651   CALCIUM 9.8 01/18/2011 0540   GFRNONAA 9* 01/18/2012 0651   GFRAA 11* 01/18/2012 0651   CBC    Component Value Date/Time   WBC 9.5 01/18/2012 0700   RBC 3.74* 01/18/2012 0700   HGB 9.0* 01/18/2012 0700   HCT 29.3* 01/18/2012 0700   PLT 393 01/18/2012 0700   MCV 78.3 01/18/2012 0700   MCH 24.1* 01/18/2012 0700   MCHC 30.7  01/18/2012 0700   RDW 18.4* 01/18/2012 0700   LYMPHSABS 1.7 01/17/2011 1655   MONOABS 0.7 01/17/2011 1655   EOSABS 0.3 01/17/2011 1655   BASOSABS 0.0 01/17/2011 1655     Assessment: 1. 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 Anemia on Aranesp 3. Sec HPTH on sensipar 4. DM 5. HTN 6. ESRD  Plan: 1.  HD on sat due to holiday 2.  Will recheck labs pre HD 3.  Sorbitol prn  Sonnet Rizor T

## 2012-01-19 NOTE — Progress Notes (Signed)
CARE MANAGEMENT NOTE 01/19/2012  Patient:  Faith [name], Faith [name]   Account Number:  [id number]  Date Initiated:  01/19/2012  Documentation initiated by:  Vance Peper  Subjective/Objective Assessment:   56 yr old female s/p revison of Right BKA to AKA.     Action/Plan:   CIR to assess patient. If not candidate will need shortterm SNF.

## 2012-01-19 NOTE — Progress Notes (Signed)
Subjective: 2 Days Post-Op Procedure(s) (LRB): AMPUTATION ABOVE KNEE (Right)Awake alert, O x2 Patient reports pain as moderate.    Objective:   VITALS:  Temp:  [98.4 F (36.9 C)-98.9 F (37.2 C)] 98.6 F (37 C) (11/28 0447) Pulse Rate:  [67-88] 88  (11/28 0447) Resp:  [16-18] 16  (11/28 0447) BP: (109-138)/(54-69) 138/69 mmHg (11/28 0447) SpO2:  [94 %-100 %] 100 % (11/28 0447)  Neurologically intact ABD soft Sensation intact distally Incision: dressing C/D/I and no drainage Compartment soft   LABS  Basename 01/18/12 0700 01/17/12 0859  HGB 9.0* 10.1*  WBC 9.5 8.9  PLT 393 399    Basename 01/18/12 0651 01/17/12 0859  NA 136 137  K 3.2* 3.1*  CL 97 96  CO2 28 29  BUN 13 8  CREATININE 4.74* 3.64*  GLUCOSE 126* 87    Basename 01/19/12 0527 01/18/12 0700  LABPT -- --  INR 2.18* 1.48     Assessment/Plan: 2 Days Post-Op Procedure(s) (LRB): AMPUTATION ABOVE KNEE (Right)  Advance diet Up with therapy Discharge planning likely Inpt rehab or SNF placement.   Dabney Dever E 01/19/2012, 10:33 AM

## 2012-01-20 DIAGNOSIS — S78119A Complete traumatic amputation at level between unspecified hip and knee, initial encounter: Secondary | ICD-10-CM

## 2012-01-20 DIAGNOSIS — L98499 Non-pressure chronic ulcer of skin of other sites with unspecified severity: Secondary | ICD-10-CM

## 2012-01-20 DIAGNOSIS — I739 Peripheral vascular disease, unspecified: Secondary | ICD-10-CM

## 2012-01-20 LAB — GLUCOSE, CAPILLARY
Glucose-Capillary: 106 mg/dL — ABNORMAL HIGH (ref 70–99)
Glucose-Capillary: 118 mg/dL — ABNORMAL HIGH (ref 70–99)
Glucose-Capillary: 122 mg/dL — ABNORMAL HIGH (ref 70–99)

## 2012-01-20 MED ORDER — NEPRO/CARBSTEADY PO LIQD
237.0000 mL | Freq: Two times a day (BID) | ORAL | Status: DC
  Administered 2012-01-20 – 2012-01-23 (×4): 237 mL via ORAL
  Filled 2012-01-20 (×9): qty 237

## 2012-01-20 NOTE — Progress Notes (Signed)
Physical Therapy Treatment Patient Details Name: Faith [name] MRN: [id number] DOB: November 26, 1955 Today's Date: 01/20/2012 Time: 4098-1191 PT Time Calculation (min): 36 min  PT Assessment / Plan / Recommendation Comments on Treatment Session  Pt was not approved for CIR placement.  Pt to d/c to home vs SNF.  Pt continues to require skilled assistance and would benefit from continued PT in SNF.      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  Pt medicated prior to session.     Mobility  Bed Mobility Bed Mobility: Sitting - Scoot to Edge of Bed;Rolling Left;Left Sidelying to Sit Rolling Right: Not tested (comment) Rolling Left: 3: Mod assist Right Sidelying to Sit: Not tested (comment) Left Sidelying to Sit: 3: Mod assist;HOB flat Supine to Sit: Not tested (comment) Sitting - Scoot to Edge of Bed: 3: Mod assist Sit to Supine: Not Tested (comment) Details for Bed Mobility Assistance: step by step verbal cueing for technique.  Assist to rotate hips to sidelying, assist to raise shoulders from bed secondary to generalized weakness.  Transfers Transfers: Lateral/Scoot Transfers Sit to Stand: Not tested (comment) Stand to Sit: Not tested (comment) Stand Pivot Transfers: Not tested (comment) Lateral/Scoot Transfers: 3: Mod assist;With slide board;From elevated surface Details for Transfer  Assistance: Pt required extensive amount of time to complete task.  Pt having difficulty moving her body weight due to generalized weakness.   Ambulation/Gait Ambulation/Gait Assistance: Not tested (comment) Wheelchair Mobility Wheelchair Mobility: No    Exercises     PT Diagnosis:    PT Problem List:   PT Treatment Interventions:     PT Goals Acute Rehab PT Goals PT Goal Formulation: With patient Time For Goal Achievement: 02/01/12 Potential to Achieve Goals: Fair Pt will Roll Supine to Right Side: with supervision PT Goal: Rolling Supine to Right Side - Progress: Progressing toward goal Pt will go Supine/Side to Sit: with supervision PT Goal: Supine/Side to Sit - Progress: Progressing toward goal Pt will Sit at Edge of Bed: with supervision PT Goal: Sit at Edge Of Bed - Progress: Progressing toward goal Pt will go Sit to Supine/Side: with supervision;with HOB 0 degrees PT Goal: Sit to Supine/Side - Progress: Progressing toward goal Pt will Transfer Bed to Chair/Chair to Bed: with min assist;Other (comment) PT Transfer Goal: Bed to Chair/Chair to Bed - Progress: Progressing toward goal  Visit Information  Last PT Received On: 01/20/12    Subjective Data  Subjective: I just want to get out of this bed.  Patient Stated Goal: Return to home with spouse.    Cognition  Overall Cognitive Status: Appears within functional limits for tasks assessed/performed Arousal/Alertness: Awake/alert Orientation Level: Appears intact for tasks assessed Behavior During Session: [facility] for tasks performed    Balance  Balance Balance Assessed: No  End of Session PT - End of Session Equipment Utilized During Treatment: Gait belt Activity Tolerance: Patient limited by  fatigue;Patient limited by pain Patient left: in chair;with call bell/phone within reach Nurse Communication: Mobility status   GP     Zaidyn Claire 01/20/2012, 4:30 PM Kishan Wachsmuth L. Markea Ruzich DPT [phone]

## 2012-01-20 NOTE — Progress Notes (Signed)
Rehab admissions - evaluated for possible admission.  Please see rehab MD consult recommending HH versus SNF.  Likely patient will progress and be able to go home with [facility].  Call me for questions.  #[phone]

## 2012-01-20 NOTE — Progress Notes (Signed)
ANTICOAGULATION CONSULT NOTE - Follow Up Consult  Pharmacy Consult for Coumadin  Indication: H/o PE  Allergies  Allergen Reactions  . Lipitor (Atorvastatin Calcium) Other (See Comments)    Muscle weakness  . Codeine Nausea And Vomiting  . Metoclopramide Hcl Other (See Comments)    Tardive dyskinesia    Patient Measurements: Height: 5' 4.57" (164 cm) (12/12/11) Weight: 153 lb 7 oz (69.6 kg) IBW/kg (Calculated) : 56    Vital Signs: Temp: 99.1 F (37.3 C) (11/29 0732) BP: 125/65 mmHg (11/29 0732) Pulse Rate: 82  (11/29 0732)  Labs:  Basename 01/20/12 0935 01/20/12 0700 01/19/12 0527 01/18/12 0700 01/18/12 0651  HGB -- -- -- 9.0* --  HCT -- -- -- 29.3* --  PLT -- -- -- 393 --  APTT -- -- -- -- --  LABPROT 42.6* 40.1* 23.3* -- --  INR 4.91* 4.53* 2.18* -- --  HEPARINUNFRC -- -- -- -- --  CREATININE -- -- -- -- 4.74*  CKTOTAL -- -- -- -- --  CKMB -- -- -- -- --  TROPONINI -- -- -- -- --    Estimated Creatinine Clearance: 12.7 ml/min (by C-G formula based on Cr of 4.74).   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 Now POD #3 s/p right AKA.   PTA dose of coumadin was 5mg  daily with last dose some time in the past week.   Admit INR 1.33 and became therapeutic after 2 doses of coumadin. INR today however up to 4.91 (from 2.18). Repeat level has confirmed elevated INR. No bleeding has been noted.   Goal of Therapy:  INR 2-3 Monitor platelets by anticoagulation protocol: Yes   Plan:  No coumadin today F/u INR in the AM Will order CBC for tomorrow AM as well   Thank you,  Brett Fairy, PharmD, BCPS 01/20/2012 10:22 AM

## 2012-01-20 NOTE — Progress Notes (Signed)
S:still with Some pain in Rt stump O:BP 125/65  Pulse 82  Temp 99.1 F (37.3 C) (Axillary)  Resp 18  Ht 5' 4.57" (1.64 m)  Wt 69.6 kg (153 lb 7 oz)  BMI 25.88 kg/m2  SpO2 99%  Intake/Output Summary (Last 24 hours) at 01/20/12 0827 Last data filed at 01/19/12 1230  Gross per 24 hour  Intake    300 ml  Output      0 ml  Net    300 ml   Weight change:  ZOX:WRUEA and alert CVS:RRR Resp:Clear Abd:+BS NTND Ext:No edema on lt  Rt stump bandaged.  + bruit RUA avg NEURO:CNI Ox3      . aspirin EC  81 mg Oral Daily  . cinacalcet  30 mg Oral Q breakfast  . cinacalcet  90 mg Oral QHS  . coumadin book   Does not apply Once  . darbepoetin (ARANESP) injection - DIALYSIS  12.5 mcg Intravenous Q Wed-HD  . ezetimibe  10 mg Oral Daily  . fenofibrate  54 mg Oral Daily  . gabapentin  300 mg Oral QHS  . insulin aspart  0-9 Units Subcutaneous TID WC  . insulin aspart  3 Units Subcutaneous TID WC  . multivitamin  1 tablet Oral QHS  . pantoprazole  40 mg Oral QAC lunch  . senna-docusate  1 tablet Oral BID  . sevelamer  800 mg Oral TID WC  . [COMPLETED] warfarin  5 mg Oral ONCE-1800  . Warfarin - Pharmacist Dosing Inpatient   Does not apply q1800   No results found. BMET    Component Value Date/Time   NA 136 01/18/2012 0651   K 3.2* 01/18/2012 0651   CL 97 01/18/2012 0651   CO2 28 01/18/2012 0651   GLUCOSE 126* 01/18/2012 0651   BUN 13 01/18/2012 0651   CREATININE 4.74* 01/18/2012 0651   CALCIUM 9.5 01/18/2012 0651   CALCIUM 9.8 01/18/2011 0540   GFRNONAA 9* 01/18/2012 0651   GFRAA 11* 01/18/2012 0651   CBC    Component Value Date/Time   WBC 9.5 01/18/2012 0700   RBC 3.74* 01/18/2012 0700   HGB 9.0* 01/18/2012 0700   HCT 29.3* 01/18/2012 0700   PLT 393 01/18/2012 0700   MCV 78.3 01/18/2012 0700   MCH 24.1* 01/18/2012 0700   MCHC 30.7 01/18/2012 0700   RDW 18.4* 01/18/2012 0700   LYMPHSABS 1.7 01/17/2011 1655   MONOABS 0.7 01/17/2011 1655   EOSABS 0.3 01/17/2011 1655     BASOSABS 0.0 01/17/2011 1655     Assessment: 1. 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 Anemia on Aranesp 3. Sec HPTH on sensipar 4. DM 5. HTN 6. ESRD  Plan: 1.  HD in Am and will recheck labs 2.  ? Back to rehab when ok with ortho   Ami Mally T

## 2012-01-20 NOTE — Progress Notes (Signed)
CARE MANAGEMENT NOTE 01/20/2012  Patient:  Faith [name], Faith [name]   Account Number:  [id number]  Date Initiated:  01/19/2012  Documentation initiated by:  Vance Peper  Subjective/Objective Assessment:   56 yr old female s/p revison of Right BKA to AKA.     Action/Plan:   CIR to assess patient. If not candidate will need shortterm SNF.   Anticipated DC Date:  01/23/2012   Anticipated DC Plan:  SKILLED NURSING FACILITY         Choice offered to / List presented to:             Status of service:  Completed, signed off Medicare Important Message given?   (If response is "NO", the following Medicare IM given date fields will be blank) Date Medicare IM given:   Date Additional Medicare IM given:    Discharge Disposition:  SKILLED NURSING FACILITY  Per UR Regulation:    If discussed at Long Length of Stay Meetings, dates discussed:    Comments:  01/20/12 10:32 Vance Peper, RN BSN Case Manager Cm received call from Rosezena Sensor Social Worker with [phone]. Patient is active with [facility], husband has stated that she is to go to [facility] for shortterm rehab. CM informed Dede Query, Child psychotherapist.

## 2012-01-20 NOTE — Clinical Social Work Psychosocial (Signed)
     Clinical Social Work Department BRIEF PSYCHOSOCIAL ASSESSMENT 01/20/2012  Patient:  Faith [name], Faith [name]     Account Number:  [id number]     Admit date:  01/17/2012  Clinical Social Worker:  Lourdes Sledge  Date/Time:  01/20/2012 03:40 PM  Referred by:  Physician  Date Referred:  01/20/2012 Referred for  SNF Placement   Other Referral:   Interview type:  Patient Other interview type:   CSW also completed assessment with pt spouse Faith [name] [phone]    PSYCHOSOCIAL DATA Living Status:  HUSBAND Admitted from facility:   Level of care:   Primary support name:  Faith [name] [phone] Primary support relationship to patient:  SPOUSE Degree of support available:   Pt husband actively involved in pt care.    CURRENT CONCERNS Current Concerns  Post-Acute Placement   Other Concerns:   SNF/CIR or HH    SOCIAL WORK ASSESSMENT / PLAN CSW received referral for possible SNF placement.    CSW informed pt is a [facility] participant and will need SNF placement after declining CIR. CSW visited pt room and discussed discharge options with pt. 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 Pt did state she would like CIR, and would like to spend sometime today thinking about whether she would like CIR or SNF.    CSW has faxed pt out and confirmed with [facility] that they are able to offer a bed. CSW also spoke to pt spouse that is agreeable to either discharge plan for pt.   Assessment/plan status:  Psychosocial Support/Ongoing Assessment of Needs Other assessment/ plan:   Information/referral to community resources:   Pt is a [facility] participant and was not in need of a SNF list or any other resources.    PATIENTS/FAMILYS RESPONSE TO PLAN OF CARE: Pt alert and oriented and states she is agreeable to either CIR or placement at [facility]. Pt would like to consider CIR if placement is offered.    CSW to follow up with pt and CIR to determine  most appropriate level of care needed for pt at discharge.

## 2012-01-20 NOTE — Clinical Social Work Placement (Signed)
    Clinical Social Work Department CLINICAL SOCIAL WORK PLACEMENT NOTE 01/20/2012  Patient:  Faith [name], Faith [name]  Account Number:  [id number] Admit date:  01/17/2012  Clinical Social Worker:  Theresia Bough, Theresia Majors  Date/time:  01/20/2012 03:46 PM  Clinical Social Work is seeking post-discharge placement for this patient at the following level of care:   SKILLED NURSING   (*CSW will update this form in Epic as items are completed)   01/20/2012  Patient/family provided with [facility] Department of Clinical Social Work's list of facilities offering this level of care within the geographic area requested by the patient (or if unable, by the patient's family).  01/20/2012  Patient/family informed of their freedom to choose among providers that offer the needed level of care, that participate in Medicare, Medicaid or managed care program needed by the patient, have an available bed and are willing to accept the patient.  01/20/2012  Patient/family informed of [facility]' ownership interest in [facility], as well as of the fact that they are under no obligation to receive care at this facility.  PASARR submitted to EDS on 01/20/2012 PASARR number received from EDS on 01/20/2012  FL2 transmitted to all facilities in geographic area requested by pt/family on  01/20/2012 FL2 transmitted to all facilities within larger geographic area on   Patient informed that his/her managed care company has contracts with or will negotiate with  certain facilities, including the following:     Patient/family informed of bed offers received:  01/20/2012 Patient chooses bed at [facility] Physician recommends and patient chooses bed at    Patient to be transferred to  on   Patient to be transferred to facility by   The following physician request were entered in Epic:   Additional Comments:

## 2012-01-20 NOTE — Consult Note (Signed)
Physical Medicine and Rehabilitation Consult Reason for Consult: Right AKA Referring Physician: Dr. Otelia Sergeant   HPI: Faith [name] is a 56 y.o. right-handed female with history of coronary artery disease and coronary artery bypass grafting, end-stage renal disease with hemodialysis, diastolic congestive heart failure. Patient with recent transtibial amputation right lower extremity received inpatient rehabilitation services and was discharged to home  01/11/2012 doing well. 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 She was  admitted on 01/17/2012 with gangrenous changes and poor healing of recent transtibial amputation with suspect osteomyelitis and deep dehiscence of wound. 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 Lajoyce Corners. Maintained on Coumadin for DVT prophylaxis. Postoperative pain management. Hemodialysis ongoing as per [facility]. Physical therapy evaluation completed 01/18/2012 and patient felt to be close to her baseline. M.D. is requested physical medicine rehabilitation consult to consider inpatient rehabilitation services  The patient has 24-hour care at home. She attends the pace program, she has a CNA to assist, and her husband also assist her. She has transportation to and from dialysis.She is discharged from rehabilitation at a minimal assistance using a sliding board for mobility. She has had one therapy visit during this hospital stay and the patient require total assist without a sliding board Review of Systems  HENT: Positive for ear pain.   Gastrointestinal: Positive for constipation.  Musculoskeletal: Positive for myalgias and joint pain.  Skin:       Drainage from right lower extremity transtibial amputation site   Past Medical History  Diagnosis Date  . Hyperparathyroidism, secondary renal   . CAD (coronary artery disease) of artery bypass graft     CABG in 2002  . PAD  (peripheral artery disease)     Not a candidate for revascularization  . Chronic constipation   . GERD (gastroesophageal reflux disease)   . Anemia of chronic renal failure   . Tardive dyskinesia     Secondary to Reglan  . Depression   . Neuropathy   . Diabetic retinopathy(362.0)   . Diastolic CHF, chronic     Grade 2  . Pulmonary embolism 06/2003  . Hyperlipidemia   . Stroke 2002    "w/left side" residual weakness  . Diabetes mellitus     type II  . CHF (congestive heart failure)   . CVA (cerebral infarction)     right brain with minimal left hemiparesis  . Pneumonia 10/2009; 11/2009    ?pneumonia 01/17/11  . Hypertension   . Arthritis   . Complication of anesthesia     "sleepy afterwards; due to her being dialysis pt"  . Myocardial infarction 2002  . ESRD on dialysis     [name]; M, W, F; via right upper arm AVF (01/18/2012)   Past Surgical History  Procedure Date  . Coronary artery bypass graft 2002  . Laparoscopic cholecystectomy 2002  . Av fistula placement     times 4-using rt upper arm graft-old rt lower arm,lt upper and lower old grafts  . Trigger finger release   . Tubal ligation   . Eye examination under anesthesia w/ retinal cryotherapy and retinal laser   . Cataract extraction 07/2010    right sided   . Toe amputation 5/12    left 2nd toe secondary to osteomyelitis  . Breast biopsy 06/2010  . Carpal tunnel release 05/05/2011    Procedure: CARPAL TUNNEL RELEASE;  Surgeon: Sharma Covert, MD;  Location: [facility];  Service: Orthopedics;  Laterality: Right;  . Coronary angioplasty with stent placement   . Amputation 11/24/2011    Procedure: AMPUTATION RAY;  Surgeon: Nadara Mustard, MD;  Location: [facility] OR;  Service: Orthopedics;  Laterality: Right;  Right Foot 2nd Ray Amputation  . Amputation 12/12/2011    Procedure: AMPUTATION BELOW KNEE;  Surgeon: Nadara Mustard, MD;  Location: [facility] OR;  Service: Orthopedics;  Laterality: Right;  . Above knee leg  amputation 01/17/2012    right   Family History  Problem Relation Age of Onset  . Diabetes Mother   . Cirrhosis Mother   . Diabetes Father   . Heart attack Father   . Hyperlipidemia Father   . Heart disease Father   . Diabetes Sister   . Hyperlipidemia Sister   . Hypertension Sister   . Diabetes Sister   . Diabetes Sister   . Heart disease Brother   . Hyperlipidemia Brother    Social History:  reports that she has never smoked. She has never used smokeless tobacco. She reports that she drinks alcohol. She reports that she uses illicit drugs (Marijuana). Allergies:  Allergies  Allergen Reactions  . Lipitor (Atorvastatin Calcium) Other (See Comments)    Muscle weakness  . Codeine Nausea And Vomiting  . Metoclopramide Hcl Other (See Comments)    Tardive dyskinesia   Medications Prior to Admission  Medication Sig Dispense Refill  . acetaminophen (TYLENOL ARTHRITIS PAIN) 650 MG CR tablet Take 650 mg by mouth every 8 (eight) hours as needed. For pain      . albuterol (PROVENTIL HFA;VENTOLIN HFA) 108 (90 BASE) MCG/ACT inhaler Inhale 2 puffs into the lungs every 4 (four) hours as needed. For shortness of breath      . aspirin EC 81 MG tablet Take 81 mg by mouth daily.      . cinacalcet (SENSIPAR) 30 MG tablet Take 30 mg by mouth daily. Takes 1 tablet (30 mg) every AM (and 90mg  (1 tablet)  every evening.      . ezetimibe (ZETIA) 10 MG tablet Take 10 mg by mouth daily.      . fenofibrate micronized (LOFIBRA) 134 MG capsule Take 134 mg by mouth daily.       Marland Kitchen gabapentin (NEURONTIN) 300 MG capsule Take 1 capsule (300 mg total) by mouth at bedtime. Note decreased dose.  300 capsule  1  . guaiFENesin (ROBITUSSIN) 100 MG/5ML SOLN Take 15 mLs by mouth every 6 (six) hours as needed. For coughing/wheezing      . HYDROcodone-acetaminophen (NORCO/VICODIN) 5-325 MG per tablet Take 1 tablet by mouth every 6 (six) hours as needed. For pain      . insulin aspart (NOVOLOG) 100 UNIT/ML injection Inject  4-12 Units into the skin 3 (three) times daily before meals. Per sliding scale      . methocarbamol (ROBAXIN) 500 MG tablet Take 500 mg by mouth every 6 (six) hours as needed. For muscle spasms      . multivitamin (RENA-VIT) TABS tablet Take 1 tablet by mouth daily.       . nitroGLYCERIN (NITROSTAT) 0.4 MG SL tablet Place 0.4 mg under the tongue every 5 (five) minutes as needed. For chest pain      . pantoprazole (PROTONIX) 40 MG tablet Take 40 mg by mouth at bedtime.       . polyvinyl alcohol (LIQUIFILM TEARS) 1.4 % ophthalmic solution Place 1 drop into both eyes daily as needed. For dryness      .  senna-docusate (SENOKOT-S) 8.6-50 MG per tablet Take 1 tablet by mouth 2 (two) times daily.      . sevelamer (RENAGEL) 800 MG tablet Take 2-4 tablets (1,600-3,200 mg total) by mouth See admin instructions. Take 4 tablets with meals and 2 tablets with snacks      . tetrahydrozoline 0.05 % ophthalmic solution Place 1 drop into both eyes daily.      . traMADol (ULTRAM) 50 MG tablet Take 50 mg by mouth every 8 (eight) hours as needed. For pain/neuropathy      . warfarin (COUMADIN) 5 MG tablet Take 5 mg by mouth daily.        Home: Home Living Lives With: Spouse Available Help at Discharge: Personal care attendant;Available PRN/intermittently;Family;Other (Comment) Type of Home: House Home Access: Level entry Home Layout: One level Bathroom Shower/Tub: Tub/shower unit;Curtain Firefighter: Standard Bathroom Accessibility: Yes How Accessible: Accessible via walker Home Adaptive Equipment: Wheelchair - powered;Wheelchair - manual;Walker - rolling Additional Comments: has RW and 4WW; used RW and manual w/c indoors; rarely power w/c in community with family  Functional History: Prior Function Able to Take Stairs?: No Driving: No Vocation: On disability Comments: Uses wheelchair for mobility, slide board transfer with assistance, [facility] program  (unsure of days) HD MWF.   Functional Status:    Mobility: Bed Mobility Bed Mobility: Supine to Sit;Sit to Supine Rolling Right: Not tested (comment) Rolling Left: Not tested (comment) Right Sidelying to Sit: Not tested (comment) Supine to Sit: 3: Mod assist Sitting - Scoot to Edge of Bed: 6: Modified independent (Device/Increase time) Sit to Supine: 3: Mod assist Transfers Transfers: Sit to Stand;Stand to Sit;Stand Pivot Transfers Sit to Stand: 1: +2 Total assist Sit to Stand: Patient Percentage: 30% Stand to Sit: 1: +2 Total assist Stand to Sit: Patient Percentage: 40% Stand Pivot Transfers: 1: +2 Total assist Stand Pivot Transfers: Patient Percentage: 20% Ambulation/Gait Ambulation/Gait Assistance: Not tested (comment) Wheelchair Mobility Wheelchair Mobility: No  ADL:    Cognition: Cognition Arousal/Alertness: Awake/alert Orientation Level: Oriented to person;Oriented to place;Oriented to situation;Disoriented to time Cognition Overall Cognitive Status: Appears within functional limits for tasks assessed/performed Arousal/Alertness: Awake/alert Orientation Level: Appears intact for tasks assessed Behavior During Session: [facility] for tasks performed  Blood pressure 122/62, pulse 81, temperature 98.1 F (36.7 C), temperature source Axillary, resp. rate 16, height 5' 4.57" (1.64 m), weight 69.6 kg (153 lb 7 oz), SpO2 98.00%. Physical Exam  Constitutional: She is oriented to person, place, and time.  HENT:  Head: Normocephalic.  Eyes:       Pupils round and reactive to light  Neck: Neck supple. No thyromegaly present.  Cardiovascular: Normal rate and regular rhythm.   Pulmonary/Chest: Breath sounds normal. She has no wheezes.  Abdominal: Soft. Bowel sounds are normal. She exhibits no distension.  Neurological: She is alert and oriented to person, place, and time.       Follows commands  Skin:       Right AKA site is dressed  Psychiatric: She has a normal mood and affect.  Motor: 5/5 in the right deltoid, biceps,  triceps, grip, 3 minus/5 in the left deltoid, biceps, triceps and grip 4/5 right hip flexor 3 minus/5 in the left hip flexor knee extensor ankle dorsiflexor and plantar flexor Sensation intact in the right upper extremity. Mildly diminished left upper extremity and unable to feel light touch in the left foot Left ankle contracture of chronic  Results for orders placed during the hospital encounter of 01/17/12 (from the past  24 hour(s))  GLUCOSE, CAPILLARY     Status: Abnormal   Collection Time   01/19/12  6:52 AM      Component Value Range   Glucose-Capillary 145 (*) 70 - 99 mg/dL  GLUCOSE, CAPILLARY     Status: Abnormal   Collection Time   01/19/12 11:42 AM      Component Value Range   Glucose-Capillary 134 (*) 70 - 99 mg/dL   Comment 1 Notify RN    GLUCOSE, CAPILLARY     Status: Abnormal   Collection Time   01/19/12  4:17 PM      Component Value Range   Glucose-Capillary 139 (*) 70 - 99 mg/dL   Comment 1 Notify RN     Comment 2 Documented in Chart    GLUCOSE, CAPILLARY     Status: Abnormal   Collection Time   01/20/12 12:02 AM      Component Value Range   Glucose-Capillary 122 (*) 70 - 99 mg/dL   No results found.  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 1. Does the need for close, 24 hr/day medical supervision in concert with the patient's rehab needs make it unreasonable for this patient to be served in a less intensive setting? Potentially 2. Co-Morbidities requiring supervision/potential complications: Diabetes, end-stage renal, coronary artery disease, diabetic neuropathy 3. Due to bowel management, skin/wound care and pain management, does the patient require 24 hr/day rehab nursing? Potentially 4. Does the patient require coordinated care of a physician, rehab nurse, PT (1-2 hrs/day, 5 days/week) to address physical and functional deficits in the context of the above medical diagnosis(es)? No and Potentially Addressing deficits in the  following areas: balance, transferring and toileting 5. Can the patient actively participate in an intensive therapy program of at least 3 hrs of therapy per day at least 5 days per week? Yes 6. The potential for patient to make measurable gains while on inpatient rehab is fair 7. Anticipated functional outcomes upon discharge from inpatient rehab are Min to mod assist mobility with PT, Not applicable with OT, Not applicable with SLP. 8. Estimated rehab length of stay to reach the above functional goals is: One week 9. Does the patient have adequate social supports to accommodate these discharge functional goals? Yes 10. Anticipated D/C setting: Home 11. Anticipated post D/C treatments: HH therapy 12. Overall Rehab/Functional Prognosis: fair  RECOMMENDATIONS: This patient's condition is appropriate for continued rehabilitative care in the following setting: Home health versus SNF. She does not requires CIR level OT services or speech therapy. Her only physical therapy eval was postop day #1. Patient has agreed to participate in recommended program. Potentially Note that insurance prior authorization may be required for reimbursement for recommended care.  Comment:Acute care PT should try patient with sliding board since this is what she uses at home    01/20/2012

## 2012-01-20 NOTE — Progress Notes (Signed)
INITIAL ADULT NUTRITION ASSESSMENT Date: 01/20/2012   Time: 12:56 PM Reason for Assessment: Nutrition Risk  ASSESSMENT: Female 56 y.o.  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 Hyperparathyroidism, secondary renal   . CAD (coronary artery disease) of artery bypass graft     CABG in 2002  . PAD (peripheral artery disease)     Not a candidate for revascularization  . Chronic constipation   . GERD (gastroesophageal reflux disease)   . Anemia of chronic renal failure   . Tardive dyskinesia     Secondary to Reglan  . Depression   . Neuropathy   . Diabetic retinopathy(362.0)   . Diastolic CHF, chronic     Grade 2  . Pulmonary embolism 06/2003  . Hyperlipidemia   . Stroke 2002    "w/left side" residual weakness  . Diabetes mellitus     type II  . CHF (congestive heart failure)   . CVA (cerebral infarction)     right brain with minimal left hemiparesis  . Pneumonia 10/2009; 11/2009    ?pneumonia 01/17/11  . Hypertension   . Arthritis   . Complication of anesthesia     "sleepy afterwards; due to her being dialysis pt"  . Myocardial infarction 2002  . ESRD on dialysis     [name]; M, W, F; via right upper arm AVF (01/18/2012)    Related Meds:  Past Surgical History  Procedure Date  . Coronary artery bypass graft 2002  . Laparoscopic cholecystectomy 2002  . Av fistula placement     times 4-using rt upper arm graft-old rt lower arm,lt upper and lower old grafts  . Trigger finger release   . Tubal ligation   . Eye examination under anesthesia w/ retinal cryotherapy and retinal laser   . Cataract extraction 07/2010    right sided   . Toe amputation 5/12    left 2nd toe secondary to osteomyelitis  . Breast biopsy 06/2010  . Carpal tunnel release 05/05/2011    Procedure: CARPAL TUNNEL RELEASE;  Surgeon: Sharma Covert, MD;  Location: [facility];  Service: Orthopedics;  Laterality: Right;  . Coronary angioplasty with  stent placement   . Amputation 11/24/2011    Procedure: AMPUTATION RAY;  Surgeon: Nadara Mustard, MD;  Location: [facility] OR;  Service: Orthopedics;  Laterality: Right;  Right Foot 2nd Ray Amputation  . Amputation 12/12/2011    Procedure: AMPUTATION BELOW KNEE;  Surgeon: Nadara Mustard, MD;  Location: [facility] OR;  Service: Orthopedics;  Laterality: Right;  . Above knee leg amputation 01/17/2012    right    Ht: 5' 4.57" (164 cm) (12/12/11)  Wt: 153 lb 7 oz (69.6 kg)  Ideal Wt: 50 kg % Ideal Wt: 139  Usual Wt:  Wt Readings from Last 10 Encounters:  01/18/12 153 lb 7 oz (69.6 kg)  01/18/12 153 lb 7 oz (69.6 kg)  01/04/12 157 lb 13.6 oz (71.6 kg)  12/14/11 159 lb 6.3 oz (72.3 kg)  12/14/11 159 lb 6.3 oz (72.3 kg)  11/25/11 175 lb 0.7 oz (79.4 kg)  11/25/11 175 lb 0.7 oz (79.4 kg)  11/10/11 180 lb (81.647 kg)  10/20/11 180 lb (81.647 kg)  10/20/11 180 lb (81.647 kg)    % Usual Wt: Weight loss due to loss of limb  BMI=27.88  Labs:  CMP     Component Value Date/Time   NA 136 01/18/2012 0651   K 3.2*  01/18/2012 0651   CL 97 01/18/2012 0651   CO2 28 01/18/2012 0651   GLUCOSE 126* 01/18/2012 0651   BUN 13 01/18/2012 0651   CREATININE 4.74* 01/18/2012 0651   CALCIUM 9.5 01/18/2012 0651   CALCIUM 9.8 01/18/2011 0540   PROT 8.0 12/12/2011 1414   ALBUMIN 2.6* 01/18/2012 0651   AST 30 12/12/2011 1414   ALT 18 12/12/2011 1414   ALKPHOS 111 12/12/2011 1414   BILITOT 1.8* 12/12/2011 1414   GFRNONAA 9* 01/18/2012 0651   GFRAA 11* 01/18/2012 0651    I/O last 3 completed shifts: In: 505 [P.O.:480; Other:25] Out: -     Diet Order: Renal  Supplements/Tube Feeding:  none  IVF:    sodium chloride    Estimated Nutritional Needs:   Kcal: 2000-2100 Protein: 80-90 gm Fluid: 1.2L FR  Food/Nutrition Related Hx: Pt reports following a renal diet with poor appetite for the past 2-3 months secondary to gangrene and now amputation.  Has had Nepro before and agreeable to this.  NUTRITION  DIAGNOSIS: -Inadequate oral intake (NI-2.1).  Status: Ongoing  RELATED TO: poor appetite  AS EVIDENCE BY: observation  MONITORING/EVALUATION(Goals): Intake of >75% meals and supplements  EDUCATION NEEDS: -No education needs identified at this time  INTERVENTION: Nepro bid Encouraged po    DOCUMENTATION CODES Per approved criteria  -Not Applicable   Oran Rein, RD, LDN Clinical Inpatient Dietitian Pager:  [phone] Weekend and after hours pager:  [phone]  01/20/2012, 12:56 PM

## 2012-01-20 NOTE — Progress Notes (Signed)
Subjective: 3 Days Post-Op Procedure(s) (LRB): AMPUTATION ABOVE KNEE (Right) this patient is awake alert oriented x3. Dialysis is scheduled for Saturday morning. Seen today Dr. Doroteo Bradford of rehabilitation. We'll wait for further physical therapy report as to whether or not she will need CIR.  Patient reports pain as mild.    Objective:   VITALS:  Temp:  [98.1 F (36.7 C)-99.1 F (37.3 C)] 99.1 F (37.3 C) (11/29 0732) Pulse Rate:  [81-84] 82  (11/29 0732) Resp:  [16-18] 18  (11/29 0732) BP: (97-125)/(43-65) 125/65 mmHg (11/29 0732) SpO2:  [98 %-99 %] 99 % (11/29 0732)  ABD soft Incision: dressing C/D/I   LABS  Basename 01/18/12 0700  HGB 9.0*  WBC 9.5  PLT 393    Basename 01/18/12 0651  NA 136  K 3.2*  CL 97  CO2 28  BUN 13  CREATININE 4.74*  GLUCOSE 126*    Basename 01/20/12 0935 01/20/12 0700  LABPT -- --  INR 4.91* 4.53*     Assessment/Plan: 3 Days Post-Op Procedure(s) (LRB): AMPUTATION ABOVE KNEE (Right)  Advance diet Up with therapy D/C IV fluids  If she does well with physical therapy she may be able to return home with home health nursing and assistance in transferring from home to the hemodialysis as she had prior to hospitalization. However is she has regressed following her right revision to the AKA then further inpatient rehabilitation may be necessary.  Jaia Alonge E 01/20/2012, 10:26 AM

## 2012-01-21 LAB — CBC
MCH: 23.6 pg — ABNORMAL LOW (ref 26.0–34.0)
MCH: 23.4 pg — ABNORMAL LOW (ref 26.0–34.0)
MCV: 76.9 fL — ABNORMAL LOW (ref 78.0–100.0)
MCV: 76.3 fL — ABNORMAL LOW (ref 78.0–100.0)
Platelets: 403 10*3/uL — ABNORMAL HIGH (ref 150–400)
Platelets: 408 10*3/uL — ABNORMAL HIGH (ref 150–400)
RBC: 3.94 MIL/uL (ref 3.87–5.11)
RBC: 3.97 MIL/uL (ref 3.87–5.11)
RDW: 18.7 % — ABNORMAL HIGH (ref 11.5–15.5)
RDW: 18.4 % — ABNORMAL HIGH (ref 11.5–15.5)
WBC: 9.5 10*3/uL (ref 4.0–10.5)
WBC: 9.3 10*3/uL (ref 4.0–10.5)

## 2012-01-21 LAB — RENAL FUNCTION PANEL
Albumin: 2.1 g/dL — ABNORMAL LOW (ref 3.5–5.2)
CO2: 27 mEq/L (ref 19–32)
Calcium: 9 mg/dL (ref 8.4–10.5)
Creatinine, Ser: 5.22 mg/dL — ABNORMAL HIGH (ref 0.50–1.10)
GFR calc Af Amer: 10 mL/min — ABNORMAL LOW (ref >90)
GFR calc non Af Amer: 8 mL/min — ABNORMAL LOW (ref >90)
Phosphorus: 2.7 mg/dL (ref 2.3–4.6)
Sodium: 130 mEq/L — ABNORMAL LOW (ref 135–145)

## 2012-01-21 LAB — PROTIME-INR: Prothrombin Time: 45.5 seconds — ABNORMAL HIGH (ref 11.6–15.2)

## 2012-01-21 MED ORDER — HYDROCODONE-ACETAMINOPHEN 5-325 MG PO TABS
ORAL_TABLET | ORAL | Status: AC
  Administered 2012-01-21: 2 via ORAL
  Filled 2012-01-21: qty 2

## 2012-01-21 MED ORDER — HYDROMORPHONE HCL PF 1 MG/ML IJ SOLN
INTRAMUSCULAR | Status: AC
  Filled 2012-01-21: qty 1

## 2012-01-21 NOTE — Progress Notes (Signed)
Subjective: 4 Days Post-Op Procedure(s) (LRB): AMPUTATION ABOVE KNEE (Right) Patient seen by Dr. Dorothe Pea yesterday he indicates that [facility] is available to assist her in her post hospitalization needs with a skilled nursing facility [facility] at [facility].   Patient reports pain as mild.    Objective:   VITALS:  Temp:  [97.9 F (36.6 C)-99.2 F (37.3 C)] 97.9 F (36.6 C) (11/30 0840) Pulse Rate:  [77-82] 82  (11/30 0840) Resp:  [18-20] 20  (11/30 0840) BP: (111-135)/(60-64) 111/60 mmHg (11/30 0840) SpO2:  [95 %-99 %] 95 % (11/30 0840)  Neurologically intact ABD soft Incision: dressing C/D/I   LABS  Basename 01/21/12 0538  HGB 9.3*  WBC 9.5  PLT 403*   No results found for this basename: NA:2,K:2,CL:2,CO2:2,BUN:2,CREATININE:2,GLUCOSE:2, in the last 72 hours  Basename 01/21/12 0538 01/20/12 0935  LABPT -- --  INR 5.28* 4.91*     Assessment/Plan: 4 Days Post-Op Procedure(s) (LRB): AMPUTATION ABOVE KNEE (Right)  Advance diet Up with therapy Discharge to SNF on Monday. Hemodialysis today.  Daniell Mancinas E 01/21/2012, 9:16 AM

## 2012-01-21 NOTE — Procedures (Signed)
I was present at this dialysis session. I have reviewed the session itself and made no changes.  Access AVG right upper cannulated with 15 gauge needles.  BFR 500.  2 K bath.  No issues at present.  Camille Bal, MD [facility] 01/21/2012, 9:19 AM

## 2012-01-21 NOTE — Progress Notes (Signed)
Patient returned from dialysis without s/s of distress. V/S BP112/77, HR 84, RR 16 T97.9. O2sat 100 at rm air and at res. Meds given as ordered. Called for lunch tray for her. Resting in bed.

## 2012-01-21 NOTE — Progress Notes (Signed)
Subjective: very emotional "why does this have to happen to me?" Thinks her dressing on her stump is too tight Objective:  On dialysis at present  Vital signs in last 24 hours: Filed Vitals:   01/21/12 0515 01/21/12 0840 01/21/12 0852 01/21/12 0857  BP: 135/61 111/60 113/62 116/59  Pulse: 80 82 81 75  Temp: 99 F (37.2 C) 97.9 F (36.6 C)    TempSrc: Oral Oral    Resp: 18 20    Height:      Weight:      SpO2: 98% 95%     Weight change:   Intake/Output Summary (Last 24 hours) at 01/21/12 0924 Last data filed at 01/21/12 0516  Gross per 24 hour  Intake     85 ml  Output      0 ml  Net     85 ml    Physical Exam:  Blood pressure 116/59, pulse 75, temperature 97.9 F (36.6 C), temperature source Oral, resp. rate 20, height 5' 4.57" (1.64 m), weight 69.6 kg (153 lb 7 oz), SpO2 95.00%. Lungs clear to A. S1S2 no S3 Abdomen soft without tenderness Access right upper arm cannulated Right stump with tight dressing in place Left leg no significant edema  LABS FROM TODAY (11/30) STILL PENDING  Lab 01/18/12 0651 01/17/12 0859  NA 136 137  K 3.2* 3.1*  CL 97 96  CO2 28 29  GLUCOSE 126* 87  BUN 13 8  CREATININE 4.74* 3.64*  ALB -- --  CALCIUM 9.5 9.5  PHOS 3.5 --    Lab 01/21/12 0538 01/18/12 0700 01/17/12 0859  WBC 9.5 9.5 8.9  NEUTROABS -- -- --  HGB 9.3* 9.0* 10.1*  HCT 30.3* 29.3* 33.3*  MCV 76.9* 78.3 79.7  PLT 403* 393 399    Lab 01/21/12 0611 01/20/12 2245 01/20/12 1542 01/20/12 1110 01/20/12 0750  GLUCAP 90 118* 106* 127* 97       . sodium chloride       Medications . aspirin EC  81 mg Oral Daily  . cinacalcet  30 mg Oral Q breakfast  . cinacalcet  90 mg Oral QHS  . coumadin book   Does not apply Once  . darbepoetin (ARANESP) injection - DIALYSIS  12.5 mcg Intravenous Q Wed-HD  . ezetimibe  10 mg Oral Daily  . feeding supplement (NEPRO CARB STEADY)  237 mL Oral BID BM  . fenofibrate  54 mg Oral Daily  . gabapentin  300 mg Oral QHS  . insulin  aspart  0-9 Units Subcutaneous TID WC  . insulin aspart  3 Units Subcutaneous TID WC  . multivitamin  1 tablet Oral QHS  . pantoprazole  40 mg Oral QAC lunch  . senna-docusate  1 tablet Oral BID  . sevelamer  800 mg Oral TID WC  . Warfarin - Pharmacist Dosing Inpatient   Does not apply q1800     I  have reviewed scheduled and prn medications.  Dialysis Orders: [facility] MWF Prior EDW 72 kg 3.5 hr 2K 2 Ca profile 4 Optiflux 180 right upper AVGG Epo 1400 Hgb was 9.5 11/15  ASSESSMENT/RECOMMENDATIONS  1. 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 Otelia Sergeant saw today no new recs Appears plan is for D/C at some point to [facility] 2. Anemia on Aranesp 12.5 mcg Hb stable around 9  3. 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 DM per primary 5. HTN per primary 6. ESRD  Off schedule today,  will get back on MWF schedule on Monday Old dry weight was 72; pre HD today was 70.9.  Will use post weight today as new outpt EDW     Camille Bal, MD [facility] [phone] Pager 01/21/2012, 9:24 AM

## 2012-01-21 NOTE — Progress Notes (Signed)
MD on call notified that Pt had a critical value INR 5.28 was instructed to place orange critical value alert sticker on pt chart. No sign of bleeding.Will Continue to monitor. Ilean Skill LPN

## 2012-01-21 NOTE — Progress Notes (Signed)
ANTICOAGULATION CONSULT NOTE - Follow Up Consult  Pharmacy Consult for coumadin Indication: Hx of PE  Allergies  Allergen Reactions  . Lipitor (Atorvastatin Calcium) Other (See Comments)    Muscle weakness  . Codeine Nausea And Vomiting  . Metoclopramide Hcl Other (See Comments)    Tardive dyskinesia    Patient Measurements: Height: 5' 4.57" (164 cm) (12/12/11) Weight: 153 lb 7 oz (69.6 kg) IBW/kg (Calculated) : 56   Vital Signs: Temp: 99 F (37.2 C) (11/30 0515) Temp src: Oral (11/30 0515) BP: 135/61 mmHg (11/30 0515) Pulse Rate: 80  (11/30 0515)  Labs:  Basename 01/21/12 0538 01/20/12 0935 01/20/12 0700  HGB 9.3* -- --  HCT 30.3* -- --  PLT 403* -- --  APTT -- -- --  LABPROT 45.5* 42.6* 40.1*  INR 5.28* 4.91* 4.53*  HEPARINUNFRC -- -- --  CREATININE -- -- --  CKTOTAL -- -- --  CKMB -- -- --  TROPONINI -- -- --    Estimated Creatinine Clearance: 12.7 ml/min (by C-G formula based on Cr of 4.74).   Medications:  Scheduled:    . aspirin EC  81 mg Oral Daily  . cinacalcet  30 mg Oral Q breakfast  . cinacalcet  90 mg Oral QHS  . coumadin book   Does not apply Once  . darbepoetin (ARANESP) injection - DIALYSIS  12.5 mcg Intravenous Q Wed-HD  . ezetimibe  10 mg Oral Daily  . feeding supplement (NEPRO CARB STEADY)  237 mL Oral BID BM  . fenofibrate  54 mg Oral Daily  . gabapentin  300 mg Oral QHS  . insulin aspart  0-9 Units Subcutaneous TID WC  . insulin aspart  3 Units Subcutaneous TID WC  . multivitamin  1 tablet Oral QHS  . pantoprazole  40 mg Oral QAC lunch  . senna-docusate  1 tablet Oral BID  . sevelamer  800 mg Oral TID WC  . 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 Home dose of coumadin was 5mg  daily. 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 Admit  INR =1.33. INR has trended from 2.18>4.91 yesterday (dose was held). Today's INR 5.28. H/H 9.3/30.3 (slight decr), pltc 403K. Per RN no bleeding has been noted.    Goal of Therapy:  INR 2-3 Monitor platelets by anticoagulation protocol: Yes   Plan:  1. No coumadin today x1 2. F/U INR in AM 3. Monitor for s/sx of bleeding  Bola A. Wandra Feinstein D Clinical Pharmacist Pager:[phone] Phone [phone] 01/21/2012 8:37 AM

## 2012-01-22 LAB — GLUCOSE, CAPILLARY: Glucose-Capillary: 128 mg/dL — ABNORMAL HIGH (ref 70–99)

## 2012-01-22 MED ORDER — DARBEPOETIN ALFA-POLYSORBATE 100 MCG/0.5ML IJ SOLN: 100.0000 ug | INTRAMUSCULAR | Status: DC

## 2012-01-22 MED ORDER — WARFARIN SODIUM 3 MG PO TABS
3.0000 mg | ORAL_TABLET | Freq: Once | ORAL | Status: AC
  Administered 2012-01-22: 3 mg via ORAL
  Filled 2012-01-22: qty 1

## 2012-01-22 NOTE — Progress Notes (Signed)
ANTICOAGULATION CONSULT NOTE - Follow Up Consult  Pharmacy Consult for coumadin Indication: H/o PE  Allergies  Allergen Reactions  . Lipitor (Atorvastatin Calcium) Other (See Comments)    Muscle weakness  . Codeine Nausea And Vomiting  . Metoclopramide Hcl Other (See Comments)    Tardive dyskinesia    Patient Measurements: Height: 5' 4.57" (164 cm) (12/12/11) Weight: 149 lb 7.6 oz (67.8 kg) IBW/kg (Calculated) : 56    Vital Signs: Temp: 98.6 F (37 C) (12/01 0649) BP: 106/51 mmHg (12/01 0649) Pulse Rate: 86  (12/01 0649)  Labs:  Basename 01/22/12 0500 01/21/12 0905 01/21/12 0538 01/20/12 0935  HGB -- 9.3* 9.3* --  HCT -- 30.3* 30.3* --  PLT -- 408* 403* --  APTT -- -- -- --  LABPROT 25.1* -- 45.5* 42.6*  INR 2.41* -- 5.28* 4.91*  HEPARINUNFRC -- -- -- --  CREATININE -- 5.22* -- --  CKTOTAL -- -- -- --  CKMB -- -- -- --  TROPONINI -- -- -- --    Estimated Creatinine Clearance: 11.4 ml/min (by C-G formula based on Cr of 5.22).   Medications:  Scheduled:    . aspirin EC  81 mg Oral Daily  . cinacalcet  30 mg Oral Q breakfast  . cinacalcet  90 mg Oral QHS  . coumadin book   Does not apply Once  . darbepoetin (ARANESP) injection - DIALYSIS  12.5 mcg Intravenous Q Wed-HD  . ezetimibe  10 mg Oral Daily  . feeding supplement (NEPRO CARB STEADY)  237 mL Oral BID BM  . fenofibrate  54 mg Oral Daily  . gabapentin  300 mg Oral QHS  . insulin aspart  0-9 Units Subcutaneous TID WC  . insulin aspart  3 Units Subcutaneous TID WC  . multivitamin  1 tablet Oral QHS  . pantoprazole  40 mg Oral QAC lunch  . senna-docusate  1 tablet Oral BID  . sevelamer  800 mg Oral TID WC  . 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 Home dose of coumadin was 5mg  daily. 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   Admit INR =1.33 and became therapeutic after only 2 doses of coumadin. INR trended to 5.28 yesterday, dose was held and today's INR therapeutic at 2.41. Will restart coumadin at lower dose. H/H 9.3/30.3 stable. No bleeding has been noted.   Goal of Therapy:  INR 2-3 Monitor platelets by anticoagulation protocol: Yes   Plan:  1.Will restart coumadin at  3mg  x1 2.F/U INR in AM 3. Monitor for s/sx of bleeding  Bola A. Wandra Feinstein D Clinical Pharmacist Pager:[phone] Phone [phone] 01/22/2012 8:15 AM

## 2012-01-22 NOTE — Progress Notes (Signed)
Subjective:   Continues to have right stump pain, phantom pains, but hoping for rehab at home.  Objective: Vital signs in last 24 hours: Temp:  [97.8 F (36.6 C)-98.6 F (37 C)] 98.6 F (37 C) (12/01 0649) Pulse Rate:  [73-87] 86  (12/01 0649) Resp:  [18-20] 18  (12/01 0649) BP: (106-126)/(45-64) 106/51 mmHg (12/01 0649) SpO2:  [95 %-100 %] 100 % (12/01 0649) Weight:  [67.8 kg (149 lb 7.6 oz)] 67.8 kg (149 lb 7.6 oz) (11/30 1226) Weight change:   Intake/Output from previous day: 11/30 0701 - 12/01 0700 In: 320 [P.O.:320] Out: 3000    EXAM: General appearance:  Alert, cooperative, in no apparent distress Resp:  CTA without rales, rhonchi, or wheezes Cardio:  RRR without murmur or rub GI:  + BS, soft and nontender Extremities:  No edema on left, right AKA wrapped Access:  AVG @ RUA with + bruit  Lab Results:  Basename 01/21/12 0905 01/21/12 0538  WBC 9.3 9.5  HGB 9.3* 9.3*  HCT 30.3* 30.3*  PLT 408* 403*   BMET:  Basename 01/21/12 0905  NA 130*  K 4.0  CL 93*  CO2 27  GLUCOSE 115*  BUN 18  CREATININE 5.22*  CALCIUM 9.0  ALBUMIN 2.1*   No results found for this basename: PTH:2 in the last 72 hours Iron Studies: No results found for this basename: IRON,TIBC,TRANSFERRIN,FERRITIN in the last 72 hours  Dialysis Orders: [facility] MWF Prior EDW 72 kg 3.5 hr 2K 2 Ca profile 4 Optiflux 180 right upper AVGG Epo 1400 Hgb was 9.5 11/15   Medications: . aspirin EC  81 mg Oral Daily  . cinacalcet  30 mg Oral Q breakfast  . cinacalcet  90 mg Oral QHS  . coumadin book   Does not apply Once  . darbepoetin (ARANESP) injection - DIALYSIS  100 mcg Intravenous Q Wed-HD  . ezetimibe  10 mg Oral Daily  . feeding supplement (NEPRO CARB STEADY)  237 mL Oral BID BM  . fenofibrate  54 mg Oral Daily  . gabapentin  300 mg Oral QHS  . insulin aspart  0-9 Units Subcutaneous TID WC  . insulin aspart  3 Units Subcutaneous TID WC  . multivitamin  1 tablet Oral QHS  . pantoprazole  40 mg Oral  QAC lunch  . senna-docusate  1 tablet Oral BID  . sevelamer  800 mg Oral TID WC  . warfarin  3 mg Oral ONCE-1800  . Warfarin - Pharmacist Dosing Inpatient   Does not apply q1800  . [DISCONTINUED] darbepoetin (ARANESP) injection - DIALYSIS  12.5 mcg Intravenous Q Wed-HD   Assessment/Plan: 1. 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 Lajoyce Corners; still with pain. 2. ESRD - HD on MWF @ [facility]; K 4 yesterday.  Next HD tomorrow. Post HD weight after last HD 67.8 kg so new dry use 68 kg 3. Anemia - Hgb down to 9.3 on Aranesp 12.5 on Weds.  Check CBC and increase Aranesp (100). 4. HTN/Volume - BP 106/51, no meds; wt down to 67.8 kg.  Establish new EDW. 5. Secondary hyperparathyroidism - Ca 9 (10.5 corrected), P 2.7; on Sensipar 120 mg qd, Renvela 1 with meals. 6. Nutrition - Alb 2.1, high protein renal diet. 7. DM Type 2 - on Insulin per primary. 8. Hx CVA/PVD - with right-side paresis.    LOS: 5 days   LYLES,CHARLES 01/22/2012,8:08 AM I have seen and examined this patient and agree with plan as outlined  above  Nex HD Monday to get back on schedule with new EDW.  Patient HOPEFUL that she can return home rather than SNF.   Was at home PTA (completed a course of inpt rehab after her initial BKA surgery).  Camille Bal B,MD 01/22/2012 9:59 AM

## 2012-01-22 NOTE — Progress Notes (Signed)
Subjective: 5 Days Post-Op Procedure(s) (LRB): AMPUTATION ABOVE KNEE (Right) Hemodialysis yesterday and she is in good spirits today she is up in a recliner talking conversing well. He is wide-awake and alert oriented x4. Does not really wish to go to skilled nursing facility. And she is more mobile today than she has been over the last several days I think it is possible she may be able to go to home with a day assistant area she notes that when she was previously at home she had a date assistant when her husband was at work. I think this is a good idea I told her that she is extremely careful in any of her transfers and if there is a risk of falling she needs to not try to move or transfer to wait for assistance. Does not wish to go to [place]. Likely will need to be discharged home with [facility] contacted see previous notes in order to provide an assistant during the daytime. Patient reports pain as mild.    Objective:   VITALS:  Temp:  [98.2 F (36.8 C)-98.6 F (37 C)] 98.2 F (36.8 C) (12/01 1629) Pulse Rate:  [84-87] 84  (12/01 1629) Resp:  [18] 18  (12/01 1629) BP: (106-129)/(51-66) 129/66 mmHg (12/01 1629) SpO2:  [97 %-100 %] 99 % (12/01 1629)  Neurologically intact ABD soft Incision: dressing C/D/I   LABS  Basename 01/21/12 0905 01/21/12 0538  HGB 9.3* 9.3*  WBC 9.3 9.5  PLT 408* 403*    Basename 01/21/12 0905  NA 130*  K 4.0  CL 93*  CO2 27  BUN 18  CREATININE 5.22*  GLUCOSE 115*    Basename 01/22/12 0500 01/21/12 0538  LABPT -- --  INR 2.41* 5.28*     Assessment/Plan: 5 Days Post-Op Procedure(s) (LRB): AMPUTATION ABOVE KNEE (Right)  Advance diet Plan for discharge tomorrow Discharge home with home health Arrangements will need to be made with [facility] at phone number provided so that patient can be transported to from hemodialysis as well as have a day nursing assistant work with her while her husband is working.Marland Kitchen NITKA,JAMES E 01/22/2012, 7:02 PM

## 2012-01-23 ENCOUNTER — Encounter (HOSPITAL_COMMUNITY): Payer: Self-pay | Admitting: Orthopedic Surgery

## 2012-01-23 LAB — RENAL FUNCTION PANEL
BUN: 17 mg/dL (ref 6–23)
CO2: 31 mEq/L (ref 19–32)
Calcium: 9.5 mg/dL (ref 8.4–10.5)
Creatinine, Ser: 4.33 mg/dL — ABNORMAL HIGH (ref 0.50–1.10)
GFR calc non Af Amer: 10 mL/min — ABNORMAL LOW (ref >90)

## 2012-01-23 LAB — CBC
MCH: 23.5 pg — ABNORMAL LOW (ref 26.0–34.0)
MCV: 77.2 fL — ABNORMAL LOW (ref 78.0–100.0)
Platelets: 407 10*3/uL — ABNORMAL HIGH (ref 150–400)
RBC: 4.04 MIL/uL (ref 3.87–5.11)
RDW: 18.6 % — ABNORMAL HIGH (ref 11.5–15.5)

## 2012-01-23 LAB — GLUCOSE, CAPILLARY
Glucose-Capillary: 67 mg/dL — ABNORMAL LOW (ref 70–99)
Glucose-Capillary: 112 mg/dL — ABNORMAL HIGH (ref 70–99)

## 2012-01-23 LAB — PROTIME-INR: INR: 2.73 — ABNORMAL HIGH (ref 0.00–1.49)

## 2012-01-23 MED ORDER — NEPRO/CARBSTEADY PO LIQD: 237.0000 mL | ORAL | Status: DC | PRN

## 2012-01-23 MED ORDER — HEPARIN SODIUM (PORCINE) 1000 UNIT/ML DIALYSIS: 20.0000 [IU]/kg | INTRAMUSCULAR | Status: DC | PRN

## 2012-01-23 MED ORDER — ALTEPLASE 2 MG IJ SOLR: 2.0000 mg | Freq: Once | INTRAMUSCULAR | Status: DC | PRN

## 2012-01-23 MED ORDER — WARFARIN SODIUM 1 MG PO TABS
1.0000 mg | ORAL_TABLET | ORAL | Status: AC
  Administered 2012-01-23: 1 mg via ORAL
  Filled 2012-01-23: qty 1

## 2012-01-23 MED ORDER — HYDROCODONE-ACETAMINOPHEN 5-325 MG PO TABS
ORAL_TABLET | ORAL | Status: AC
  Filled 2012-01-23: qty 2

## 2012-01-23 MED ORDER — SODIUM CHLORIDE 0.9 % IV SOLN: 100.0000 mL | INTRAVENOUS | Status: DC | PRN

## 2012-01-23 MED ORDER — PENTAFLUOROPROP-TETRAFLUOROETH EX AERO: 1.0000 "application " | INHALATION_SPRAY | CUTANEOUS | Status: DC | PRN

## 2012-01-23 MED ORDER — LIDOCAINE-PRILOCAINE 2.5-2.5 % EX CREA: 1.0000 "application " | TOPICAL_CREAM | CUTANEOUS | Status: DC | PRN

## 2012-01-23 MED ORDER — HEPARIN SODIUM (PORCINE) 1000 UNIT/ML DIALYSIS: 1000.0000 [IU] | INTRAMUSCULAR | Status: DC | PRN

## 2012-01-23 MED ORDER — HYDROCODONE-ACETAMINOPHEN 5-500 MG PO TABS: 1.0000 | ORAL_TABLET | Freq: Four times a day (QID) | ORAL | Status: DC | PRN

## 2012-01-23 MED ORDER — LIDOCAINE HCL (PF) 1 % IJ SOLN: 5.0000 mL | INTRAMUSCULAR | Status: DC | PRN

## 2012-01-23 NOTE — Progress Notes (Signed)
[facility] Progress Note  Subjective:   Sitting up in the chair.  Endorses some stump pain but feels good otherwise.  Anxious to sleep in her own bed.  Denies HA, dizziness, SOB, N/V/D.  Objective Filed Vitals:   01/22/12 0649 01/22/12 1629 01/22/12 2023 01/23/12 0552  BP: 106/51 129/66 95/45 128/65  Pulse: 86 84 78 85  Temp: 98.6 F (37 C) 98.2 F (36.8 C) 98.4 F (36.9 C) 97.9 F (36.6 C)  TempSrc:  Oral Oral Axillary  Resp: 18 18 16 16   Height:      Weight:      SpO2: 100% 99% 97% 97%   Physical Exam General: Alert, cooperative, NAD Heart: Tachy regular.  No murmur, rub or gallop appreciated Lungs: CTA bilaterally.  No wheezes or rhonchi appreciated. Abdomen: Soft, non-tender. Normal bowel sounds. Extremities:  Rt AKA wrapped.  No edema on left. Dialysis Access: RUA AVG patent  Dialysis Orders: [facility] MWF. NEW EDW 68 kg (prior was 72kg). Time: 3.5 hr. Bath: 2K 2 Ca. Profile 4 .Optiflux 180 . Right upper AVG Epo 1400 Hgb  9.5 on 12/2.   Assessment/Plan: 1. 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 Lajoyce Corners; still with pain. Plan is to be d/c'd this evening after HD with home rehab and assistance from [facility]. 2. ESRD - HD on MWF @ [facility]; K 4.2 today. Dialysis planned for this afternoon then pt to be discharged. New EDW is 68 kg based on last post HD wgt of 67.8. 3. Anemia - Hgb  9.5 on Aranesp 12.5 on Weds. Aranesp 100 mg ordered on HD for today. Will increase OP Epogen per protocol. 4. HTN/Volume - BP 128/65, no meds. New EDW to be reflected in OP dialysis orders.  5. Secondary hyperparathyroidism - Ca 9.5 (10.8 corrected), Phos 2.5; on Sensipar 120 mg qd, Renvela 1 with meals.  6. Nutrition - Alb 2.4, improving. High protein renal diet.  7. DM Type 2 - on Insulin per primary.  8. Hx CVA/PVD - with right-side paresis.  9.   Hypercoagulable state - INR therapeutic @ 2.73.    Scot Jun. Warren, PA-C 01/23/2012,11:10 AM  LOS: 6 days   Patient seen  and examined and agree with assessment and plan as above.  Vinson Moselle  MD [facility] [phone] pgr    [phone] cell 01/23/2012, 12:51 PM  Additional Objective Labs: Basic Metabolic Panel:  Lab 01/23/12 2951 01/21/12 0905 01/18/12 0651  NA 132* 130* 136  K 4.2 4.0 3.2*  CL 91* 93* 97  CO2 31 27 28   GLUCOSE 105* 115* 126*  BUN 17 18 13   CREATININE 4.33* 5.22* 4.74*  CALCIUM 9.5 9.0 9.5  ALB -- -- --  PHOS 2.5 2.7 3.5   Liver Function Tests:  Lab 01/23/12 0847 01/21/12 0905 01/18/12 0651  AST -- -- --  ALT -- -- --  ALKPHOS -- -- --  BILITOT -- -- --  PROT -- -- --  ALBUMIN 2.4* 2.1* 2.6*   CBC:  Lab 01/23/12 0846 01/21/12 0905 01/21/12 0538 01/18/12 0700 01/17/12 0859  WBC 7.7 9.3 9.5 -- --  NEUTROABS -- -- -- -- --  HGB 9.5* 9.3* 9.3* -- --  HCT 31.2* 30.3* 30.3* -- --  MCV 77.2* 76.3* 76.9* 78.3 79.7  PLT 407* 408* 403* -- --    CBG:  Lab 01/23/12 0645 01/22/12 2315 01/22/12 1627 01/22/12 1203 01/22/12 0659  GLUCAP 67* 128* 132* 143* 50*  Medications:    . sodium chloride        . aspirin EC  81 mg Oral Daily  . cinacalcet  30 mg Oral Q breakfast  . cinacalcet  90 mg Oral QHS  . coumadin book   Does not apply Once  . darbepoetin (ARANESP) injection - DIALYSIS  100 mcg Intravenous Q Wed-HD  . ezetimibe  10 mg Oral Daily  . feeding supplement (NEPRO CARB STEADY)  237 mL Oral BID BM  . fenofibrate  54 mg Oral Daily  . gabapentin  300 mg Oral QHS  . insulin aspart  0-9 Units Subcutaneous TID WC  . insulin aspart  3 Units Subcutaneous TID WC  . multivitamin  1 tablet Oral QHS  . pantoprazole  40 mg Oral QAC lunch  . senna-docusate  1 tablet Oral BID  . sevelamer  800 mg Oral TID WC  . [COMPLETED] warfarin  3 mg Oral ONCE-1800  . Warfarin - Pharmacist Dosing Inpatient   Does not apply [phone]

## 2012-01-23 NOTE — Progress Notes (Addendum)
CARE MANAGEMENT NOTE 01/23/2012  Patient:  Faith [name], Faith [name]   Account Number:  [id number]  Date Initiated:  01/19/2012  Documentation initiated by:  Vance Peper  Subjective/Objective Assessment:   56 yr old female s/p revison of Right BKA to AKA.     Action/Plan:   CIR to assess patient. If not candidate will need shortterm SNF.   Anticipated DC Date:  01/23/2012   Anticipated DC Plan:  HOME W HOME HEALTH SERVICES      DC Planning Services  [facility]  CM consult      Choice offered to / List presented to:  C-1 Patient           Status of service:  Completed, signed off Medicare Important Message given?   (If response is "NO", the following Medicare IM given date fields will be blank) Date Medicare IM given:   Date Additional Medicare IM given:    Discharge Disposition:  HOME W HOME HEALTH SERVICES  Per UR Regulation:    If discussed at Long Length of Stay Meetings, dates discussed:    Comments:  01/23/12 12:14 Vance Peper, RN BSN Case Mnagaer CM spoke with patient. She will be Discharging to home, not SNF. She is active with [facility]. Called and left message for [place].  1

## 2012-01-23 NOTE — Progress Notes (Signed)
Physical Therapy Treatment Patient Details Name: Faith [name] MRN: [id number] DOB: [birth year] Today's Date: 01/23/2012 Time: 0865-7846 PT Time Calculation (min): 23 min  PT Assessment / Plan / Recommendation Comments on Treatment Session  Patient progressing really well with mobility and declines going to snf at rehab. I believe that patient is probably back to her baseline of mobility. Will see patient again tomorrow, if she does not DC today, to ensure consisitency with mobility    Follow Up Recommendations  Home health PT;Supervision/Assistance - 24 hour     Does the patient have the potential to tolerate intense rehabilitation     Barriers to Discharge        Equipment Recommendations  None recommended by PT    Recommendations for Other Services    Frequency Min 3X/week   Plan Discharge plan remains appropriate;Frequency remains appropriate    Precautions / Restrictions Precautions Precautions: Fall   Pertinent Vitals/Pain     Mobility  Bed Mobility Supine to Sit: 5: Supervision;With rails Sitting - Scoot to Edge of Bed: 5: Supervision Details for Bed Mobility Assistance: Required increased time and use of rails. Patient able to talk with [facility] and request getting rails put on her bed at home Transfers Transfers: Lateral/Scoot Transfers Lateral/Scoot Transfers: 4: Min guard;With slide board Details for Transfer Assistance: Cues for positoining prior to starting transfer. Patient able to complete safely without physical assistance, MinGuard for safety    Exercises     PT Diagnosis:    PT Problem List:   PT Treatment Interventions:     PT Goals Acute Rehab PT Goals PT Goal: Rolling Supine to Right Side - Progress: Met PT Goal: Supine/Side to Sit - Progress: Met PT Goal: Sit at Edge Of Bed - Progress: Met PT Transfer Goal: Bed to Chair/Chair to Bed - Progress: Progressing toward goal  Visit Information  Last PT Received On: 01/23/12 Assistance Needed: +1   Subjective Data      Cognition  Overall Cognitive Status: Appears within functional limits for tasks assessed/performed Arousal/Alertness: Awake/alert Orientation Level: Appears intact for tasks assessed Behavior During Session: [facility] for tasks performed    Balance     End of Session PT - End of Session Equipment Utilized During Treatment: Gait belt Activity Tolerance: Patient tolerated treatment well Patient left: in chair;with call bell/phone within reach Nurse Communication: Mobility status   GP     Fredrich Birks 01/23/2012, 10:53 AM  01/23/2012 Fredrich Birks PTA [phone] pager [phone] office

## 2012-01-23 NOTE — Discharge Summary (Signed)
Physician Discharge Summary  Patient ID: Faith [name] MRN: [id number] DOB/AGE: 08-13-1955 56 y.o.  Admit date: 01/17/2012 Discharge date: 01/23/2012  Admission Diagnoses: Gangrene right lower extremity.  Discharge Diagnoses: Same Active Problems:  * No active hospital problems. *    Discharged Condition: stable  Hospital Course: Patient's hospital course was essentially unremarkable she underwent a right above-the-knee amputation. Patient felt that she could be discharged to home with home aide and does have transportation to dialysis.  Consults: nephrology  Significant Diagnostic Studies: labs: Routine labs  Treatments: dialysis: Hemodialysis and surgery: See operative note  Discharge Exam: Blood pressure 128/65, pulse 85, temperature 97.9 F (36.6 C), temperature source Axillary, resp. rate 16, height 5' 4.57" (1.64 m), weight 67.8 kg (149 lb 7.6 oz), SpO2 97.00%. Incision/Wound: dressing clean dry and intact at time of discharge  Disposition: 06-Home-Health Care Svc  Discharge Orders    Future Appointments: Provider: Department: Dept Phone: Center:   01/31/2012 11:30 AM Erick Colace, [phone] None       Medication List     As of 01/23/2012  6:47 AM    ASK your doctor about these medications         albuterol 108 (90 BASE) MCG/ACT inhaler   Commonly known as: PROVENTIL HFA;VENTOLIN HFA   Inhale 2 puffs into the lungs every 4 (four) hours as needed. For shortness of breath      aspirin EC 81 MG tablet   Take 81 mg by mouth daily.      cinacalcet 30 MG tablet   Commonly known as: SENSIPAR   Take 30 mg by mouth daily. Takes 1 tablet (30 mg) every AM (and 90mg  (1 tablet)  every evening.      ezetimibe 10 MG tablet   Commonly known as: ZETIA   Take 10 mg by mouth daily.      fenofibrate micronized 134 MG capsule   Commonly known as: LOFIBRA   Take 134 mg by mouth daily.      gabapentin 300 MG capsule   Commonly known as: NEURONTIN   Take 1 capsule (300 mg total) by mouth at bedtime. Note decreased dose.      guaiFENesin 100 MG/5ML Soln   Commonly known as: ROBITUSSIN   Take 15 mLs by mouth every 6 (six) hours as needed. For coughing/wheezing      HYDROcodone-acetaminophen 5-325 MG per tablet   Commonly known as: NORCO/VICODIN   Take 1 tablet by mouth every 6 (six) hours as needed. For pain      insulin aspart 100 UNIT/ML injection   Commonly known as: novoLOG   Inject 4-12 Units into the skin 3 (three) times daily before meals. Per sliding scale      methocarbamol 500 MG tablet   Commonly known as: ROBAXIN   Take 500 mg by mouth every 6 (six) hours as needed. For muscle spasms      multivitamin Tabs tablet   Take 1 tablet by mouth daily.      nitroGLYCERIN 0.4 MG SL tablet   Commonly known as: NITROSTAT   Place 0.4 mg under the tongue every 5 (five) minutes as needed. For chest pain      pantoprazole 40 MG tablet   Commonly known as: PROTONIX   Take 40 mg by mouth at bedtime.      polyvinyl alcohol 1.4 % ophthalmic solution   Commonly known as: LIQUIFILM TEARS   Place 1 drop into both eyes daily  as needed. For dryness      senna-docusate 8.6-50 MG per tablet   Commonly known as: Senokot-S   Take 1 tablet by mouth 2 (two) times daily.      sevelamer 800 MG tablet   Commonly known as: RENAGEL   Take 2-4 tablets (1,600-3,200 mg total) by mouth See admin instructions. Take 4 tablets with meals and 2 tablets with snacks      tetrahydrozoline 0.05 % ophthalmic solution   Place 1 drop into both eyes daily.      traMADol 50 MG tablet   Commonly known as: ULTRAM   Take 50 mg by mouth every 8 (eight) hours as needed. For pain/neuropathy      TYLENOL ARTHRITIS PAIN 650 MG CR tablet   Generic drug: acetaminophen   Take 650 mg by mouth every 8 (eight) hours as needed. For pain      warfarin 5 MG tablet   Commonly known as: COUMADIN   Take 5 mg by mouth daily.            Follow-up Information    Follow up with DUDA,MARCUS V, MD. In 2 weeks.   Contact information:   [address] [place] Number [place] [address] [phone]          Signed: Nadara Mustard 01/23/2012, 6:47 AM

## 2012-01-23 NOTE — Progress Notes (Signed)
ANTICOAGULATION CONSULT NOTE - Follow Up Consult  Pharmacy Consult for coumadin Indication: H/o PE  Allergies  Allergen Reactions  . Lipitor (Atorvastatin Calcium) Other (See Comments)    Muscle weakness  . Codeine Nausea And Vomiting  . Metoclopramide Hcl Other (See Comments)    Tardive dyskinesia    Patient Measurements: Height: 5' 4.57" (164 cm) (12/12/11) Weight: 155 lb 10.3 oz (70.6 kg) IBW/kg (Calculated) : 56    Vital Signs: Temp: 98.7 F (37.1 C) (12/02 1449) Temp src: Oral (12/02 1449) BP: 130/64 mmHg (12/02 1749) Pulse Rate: 81  (12/02 1749)  Labs:  Basename 01/23/12 0847 01/23/12 0846 01/22/12 0500 01/21/12 0905 01/21/12 0538  HGB -- 9.5* -- 9.3* --  HCT -- 31.2* -- 30.3* 30.3*  PLT -- 407* -- 408* 403*  APTT -- -- -- -- --  LABPROT 27.6* -- 25.1* -- 45.5*  INR 2.73* -- 2.41* -- 5.28*  HEPARINUNFRC -- -- -- -- --  CREATININE 4.33* -- -- 5.22* --  CKTOTAL -- -- -- -- --  CKMB -- -- -- -- --  TROPONINI -- -- -- -- --    Estimated Creatinine Clearance: 14 ml/min (by C-G formula based on Cr of 4.33).   Medications:  Scheduled:     . aspirin EC  81 mg Oral Daily  . cinacalcet  30 mg Oral Q breakfast  . cinacalcet  90 mg Oral QHS  . coumadin book   Does not apply Once  . darbepoetin (ARANESP) injection - DIALYSIS  100 mcg Intravenous Q Wed-HD  . ezetimibe  10 mg Oral Daily  . feeding supplement (NEPRO CARB STEADY)  237 mL Oral BID BM  . fenofibrate  54 mg Oral Daily  . gabapentin  300 mg Oral QHS  . HYDROcodone-acetaminophen      . insulin aspart  0-9 Units Subcutaneous TID WC  . insulin aspart  3 Units Subcutaneous TID WC  . multivitamin  1 tablet Oral QHS  . pantoprazole  40 mg Oral QAC lunch  . senna-docusate  1 tablet Oral BID  . sevelamer  800 mg Oral TID WC  . [COMPLETED] warfarin  3 mg Oral ONCE-1800  . Warfarin - Pharmacist Dosing Inpatient   Does not apply q1800    Assessment: Dehiscence gangrene osteomyelitis right transtibial  amputation  56 yo female with ESRD on HD qMWF who is on chronic coumadin PTA for h/o PE (06/2003). Home dose of coumadin was 5mg  daily. 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   AC: h/o PE (06/2003) on warf PTA 5mg  qday, last taken "past wk", Admit INR =1.33 warf pt. INR 2.73 today after large jump. Patient has been very sensitive to her doses during this visit.  Plan 1 Coumadin 1mg  po x 1 tonight. 2.F/U INR in AM 3. Monitor for s/sx of bleeding  Aizlyn Schifano S. Merilynn Finland, PharmD, [facility] Clinical Staff Pharmacist Pager [phone]  01/23/2012 5:58 PM

## 2012-01-31 ENCOUNTER — Encounter: Payer: Medicare (Managed Care) | Attending: Physical Medicine & Rehabilitation

## 2012-01-31 ENCOUNTER — Inpatient Hospital Stay: Payer: PRIVATE HEALTH INSURANCE | Admitting: Physical Medicine & Rehabilitation

## 2012-02-14 ENCOUNTER — Emergency Department (HOSPITAL_COMMUNITY): Payer: Medicare (Managed Care)

## 2012-02-14 ENCOUNTER — Encounter (HOSPITAL_COMMUNITY): Payer: Self-pay | Admitting: *Deleted

## 2012-02-14 ENCOUNTER — Inpatient Hospital Stay (HOSPITAL_COMMUNITY)
Admission: EM | Admit: 2012-02-14 | Discharge: 2012-02-17 | DRG: 474 | Disposition: A | Discharge status: Skilled Nursing Facility | Payer: Medicare (Managed Care) | Attending: Internal Medicine | Admitting: Internal Medicine

## 2012-02-14 DIAGNOSIS — I120 Hypertensive chronic kidney disease with stage 5 chronic kidney disease or end stage renal disease: Secondary | ICD-10-CM | POA: Diagnosis present

## 2012-02-14 DIAGNOSIS — Z8249 Family history of ischemic heart disease and other diseases of the circulatory system: Secondary | ICD-10-CM

## 2012-02-14 DIAGNOSIS — I69998 Other sequelae following unspecified cerebrovascular disease: Secondary | ICD-10-CM

## 2012-02-14 DIAGNOSIS — Z951 Presence of aortocoronary bypass graft: Secondary | ICD-10-CM

## 2012-02-14 DIAGNOSIS — I252 Old myocardial infarction: Secondary | ICD-10-CM

## 2012-02-14 DIAGNOSIS — N186 End stage renal disease: Secondary | ICD-10-CM | POA: Diagnosis present

## 2012-02-14 DIAGNOSIS — Z833 Family history of diabetes mellitus: Secondary | ICD-10-CM

## 2012-02-14 DIAGNOSIS — I739 Peripheral vascular disease, unspecified: Secondary | ICD-10-CM | POA: Diagnosis present

## 2012-02-14 DIAGNOSIS — I5032 Chronic diastolic (congestive) heart failure: Secondary | ICD-10-CM | POA: Diagnosis present

## 2012-02-14 DIAGNOSIS — I2510 Atherosclerotic heart disease of native coronary artery without angina pectoris: Secondary | ICD-10-CM | POA: Diagnosis present

## 2012-02-14 DIAGNOSIS — S98139A Complete traumatic amputation of one unspecified lesser toe, initial encounter: Secondary | ICD-10-CM

## 2012-02-14 DIAGNOSIS — Z794 Long term (current) use of insulin: Secondary | ICD-10-CM

## 2012-02-14 DIAGNOSIS — N2581 Secondary hyperparathyroidism of renal origin: Secondary | ICD-10-CM | POA: Diagnosis present

## 2012-02-14 DIAGNOSIS — N039 Chronic nephritic syndrome with unspecified morphologic changes: Secondary | ICD-10-CM | POA: Diagnosis present

## 2012-02-14 DIAGNOSIS — D631 Anemia in chronic kidney disease: Secondary | ICD-10-CM | POA: Diagnosis present

## 2012-02-14 DIAGNOSIS — E119 Type 2 diabetes mellitus without complications: Secondary | ICD-10-CM | POA: Diagnosis present

## 2012-02-14 DIAGNOSIS — I509 Heart failure, unspecified: Secondary | ICD-10-CM | POA: Diagnosis present

## 2012-02-14 DIAGNOSIS — T8740 Infection of amputation stump, unspecified extremity: Secondary | ICD-10-CM | POA: Diagnosis present

## 2012-02-14 DIAGNOSIS — Y835 Amputation of limb(s) as the cause of abnormal reaction of the patient, or of later complication, without mention of misadventure at the time of the procedure: Secondary | ICD-10-CM | POA: Diagnosis present

## 2012-02-14 DIAGNOSIS — K219 Gastro-esophageal reflux disease without esophagitis: Secondary | ICD-10-CM | POA: Diagnosis present

## 2012-02-14 DIAGNOSIS — Z992 Dependence on renal dialysis: Secondary | ICD-10-CM

## 2012-02-14 DIAGNOSIS — E11319 Type 2 diabetes mellitus with unspecified diabetic retinopathy without macular edema: Secondary | ICD-10-CM | POA: Diagnosis present

## 2012-02-14 DIAGNOSIS — Z7982 Long term (current) use of aspirin: Secondary | ICD-10-CM

## 2012-02-14 DIAGNOSIS — E1139 Type 2 diabetes mellitus with other diabetic ophthalmic complication: Secondary | ICD-10-CM | POA: Diagnosis present

## 2012-02-14 DIAGNOSIS — Z86711 Personal history of pulmonary embolism: Secondary | ICD-10-CM

## 2012-02-14 DIAGNOSIS — E876 Hypokalemia: Secondary | ICD-10-CM | POA: Diagnosis not present

## 2012-02-14 DIAGNOSIS — R29898 Other symptoms and signs involving the musculoskeletal system: Secondary | ICD-10-CM | POA: Diagnosis present

## 2012-02-14 LAB — CBC WITH DIFFERENTIAL/PLATELET
Eosinophils Absolute: 0.1 10*3/uL (ref 0.0–0.7)
HCT: 32 % — ABNORMAL LOW (ref 36.0–46.0)
Hemoglobin: 9.8 g/dL — ABNORMAL LOW (ref 12.0–15.0)
Lymphs Abs: 1.6 10*3/uL (ref 0.7–4.0)
MCH: 23.6 pg — ABNORMAL LOW (ref 26.0–34.0)
Monocytes Absolute: 0.8 10*3/uL (ref 0.1–1.0)
Monocytes Relative: 9 % (ref 3–12)
Neutrophils Relative %: 68 % (ref 43–77)
RBC: 4.15 MIL/uL (ref 3.87–5.11)

## 2012-02-14 LAB — BASIC METABOLIC PANEL
BUN: 4 mg/dL — ABNORMAL LOW (ref 6–23)
CO2: 30 mEq/L (ref 19–32)
Chloride: 97 mEq/L (ref 96–112)
GFR calc non Af Amer: 30 mL/min — ABNORMAL LOW (ref >90)
Glucose, Bld: 140 mg/dL — ABNORMAL HIGH (ref 70–99)
Potassium: 2.8 mEq/L — ABNORMAL LOW (ref 3.5–5.1)

## 2012-02-14 MED ORDER — POTASSIUM CHLORIDE CRYS ER 20 MEQ PO TBCR
40.0000 meq | EXTENDED_RELEASE_TABLET | Freq: Once | ORAL | Status: AC
  Administered 2012-02-14: 40 meq via ORAL
  Filled 2012-02-14: qty 2

## 2012-02-14 MED ORDER — PIPERACILLIN-TAZOBACTAM 3.375 G IVPB 30 MIN
3.3750 g | Freq: Once | INTRAVENOUS | Status: AC
  Administered 2012-02-14: 3.375 g via INTRAVENOUS
  Filled 2012-02-14: qty 50

## 2012-02-14 MED ORDER — VANCOMYCIN HCL IN DEXTROSE 1-5 GM/200ML-% IV SOLN
1000.0000 mg | Freq: Once | INTRAVENOUS | Status: AC
  Administered 2012-02-15: 1000 mg via INTRAVENOUS
  Filled 2012-02-14: qty 200

## 2012-02-14 MED ORDER — HYDROCODONE-ACETAMINOPHEN 5-325 MG PO TABS
2.0000 | ORAL_TABLET | Freq: Once | ORAL | Status: AC
  Administered 2012-02-14: 2 via ORAL
  Filled 2012-02-14: qty 2

## 2012-02-14 NOTE — ED Notes (Signed)
Pt had right aka done 2 months ago and had a revision done 3 weeks ago.  Pt reports it started bleeding and was referred to the ER.  Pt has a bandage to the site applied one hour ago and no drainage seen

## 2012-02-14 NOTE — ED Provider Notes (Signed)
History     CSN: [id number]  Arrival date & time 02/14/12  1810   First MD Initiated Contact with Patient 02/14/12 2019      Chief Complaint  Patient presents with  . Bleeding Stump     (Consider location/radiation/quality/duration/timing/severity/associated sxs/prior treatment) Patient is a 56 y.o. female presenting with leg pain. The history is provided by the patient. No language interpreter was used.  Leg Pain  There was no injury mechanism. The pain is present in the right thigh. The quality of the pain is described as aching. The pain is at a severity of 4/10. The pain is moderate. The pain has been constant since onset. Associated symptoms include inability to bear weight. She reports no foreign bodies present. She has tried nothing for the symptoms. The treatment provided no relief.    Past Medical History  Diagnosis Date  . Hyperparathyroidism, secondary renal   . CAD (coronary artery disease) of artery bypass graft     CABG in 2002  . PAD (peripheral artery disease)     Not a candidate for revascularization  . Chronic constipation   . GERD (gastroesophageal reflux disease)   . Anemia of chronic renal failure   . Tardive dyskinesia     Secondary to Reglan  . Depression   . Neuropathy   . Diabetic retinopathy(362.0)   . Diastolic CHF, chronic     Grade 2  . Pulmonary embolism 06/2003  . Hyperlipidemia   . Stroke 2002    "w/left side" residual weakness  . Diabetes mellitus     type II  . CHF (congestive heart failure)   . CVA (cerebral infarction)     right brain with minimal left hemiparesis  . Pneumonia 10/2009; 11/2009    ?pneumonia 01/17/11  . Hypertension   . Arthritis   . Complication of anesthesia     "sleepy afterwards; due to her being dialysis pt"  . Myocardial infarction 2002  . ESRD on dialysis     [name]; M, W, F; via right upper arm AVF (01/18/2012)    Past Surgical History  Procedure Date  . Coronary artery bypass graft 2002  .  Laparoscopic cholecystectomy 2002  . Av fistula placement     times 4-using rt upper arm graft-old rt lower arm,lt upper and lower old grafts  . Trigger finger release   . Tubal ligation   . Eye examination under anesthesia w/ retinal cryotherapy and retinal laser   . Cataract extraction 07/2010    right sided   . Toe amputation 5/12    left 2nd toe secondary to osteomyelitis  . Breast biopsy 06/2010  . Carpal tunnel release 05/05/2011    Procedure: CARPAL TUNNEL RELEASE;  Surgeon: Sharma Covert, MD;  Location: [facility];  Service: Orthopedics;  Laterality: Right;  . Coronary angioplasty with stent placement   . Amputation 11/24/2011    Procedure: AMPUTATION RAY;  Surgeon: Nadara Mustard, MD;  Location: [facility] OR;  Service: Orthopedics;  Laterality: Right;  Right Foot 2nd Ray Amputation  . Amputation 12/12/2011    Procedure: AMPUTATION BELOW KNEE;  Surgeon: Nadara Mustard, MD;  Location: [facility] OR;  Service: Orthopedics;  Laterality: Right;  . 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 Amputation 01/17/2012    Procedure: AMPUTATION ABOVE KNEE;  Surgeon: Nadara Mustard, MD;  Location: [facility] OR;  Service: Orthopedics;  Laterality: Right;  Right Above Knee Amputation    Family History  Problem Relation Age of Onset  . Diabetes Mother   . Cirrhosis Mother   . Diabetes Father   . Heart attack Father   . Hyperlipidemia Father   . Heart disease Father   . Diabetes Sister   . Hyperlipidemia Sister   . Hypertension Sister   . Diabetes Sister   . Diabetes Sister   . Heart disease Brother   . Hyperlipidemia Brother     History  Substance Use Topics  . Smoking status: Never Smoker   . Smokeless tobacco: Never Used     Comment: 01/18/2012 "quit smoking  marijuana~ 15 yr ago"  . Alcohol Use: Yes     Comment: 01/18/2012 "occasionally I'll have a mixed drink"    OB History    Grav Para Term Preterm Abortions TAB SAB Ect Mult Living                  Review of Systems    Constitutional: Negative for fever and chills.  HENT: Negative for congestion and sore throat.   Respiratory: Negative for cough and shortness of breath.   Cardiovascular: Negative for chest pain and leg swelling.  Gastrointestinal: Negative for nausea, vomiting, abdominal pain, diarrhea and constipation.  Genitourinary: Negative for dysuria and frequency.  Musculoskeletal: Positive for myalgias and gait problem.  Skin: Negative for color change and rash.  Neurological: Negative for dizziness and headaches.  Psychiatric/Behavioral: Negative for confusion and agitation.  All other systems reviewed and are negative.    Allergies  Lipitor; Codeine; and Metoclopramide hcl  Home Medications   Current Outpatient Rx  Name  Route  Sig  Dispense  Refill  . ACETAMINOPHEN ER 650 MG PO TBCR   Oral   Take 650 mg by mouth every 8 (eight) hours as needed. For pain         . ALBUTEROL SULFATE HFA 108 (90 BASE) MCG/ACT IN AERS   Inhalation   Inhale 2 puffs into the lungs every 4 (four) hours as needed. For shortness of breath         . ASPIRIN EC 81 MG PO TBEC   Oral   Take 81 mg by mouth daily.         Marland Kitchen CINACALCET HCL 30 MG PO TABS   Oral   Take 30 mg by mouth daily with breakfast.         . CINACALCET HCL 90 MG PO TABS   Oral   Take 90 mg by mouth every evening.         Marland Kitchen EZETIMIBE 10 MG PO TABS   Oral   Take 10 mg by mouth daily.         . FENOFIBRATE MICRONIZED 134 MG PO CAPS   Oral   Take 134 mg by mouth daily.          Marland Kitchen GABAPENTIN 300 MG PO CAPS   Oral   Take 1 capsule (300 mg total) by mouth at bedtime. Note decreased dose.   300 capsule   1   . GUAIFENESIN 100 MG/5ML PO SOLN   Oral   Take 15 mLs by mouth every 6 (six) hours as needed. For coughing/wheezing         . HYDROCODONE-ACETAMINOPHEN 5-500 MG PO TABS   Oral   Take 1 tablet by mouth every 6 (six) hours as needed. For pain         . INSULIN ASPART 100 UNIT/ML [place] SOLN   Subcutaneous    Inject  4-12 Units into the skin 3 (three) times daily before meals. Per sliding scale         . METHOCARBAMOL 500 MG PO TABS   Oral   Take 500 mg by mouth every 6 (six) hours as needed. For muscle spasms         . RENA-VITE PO TABS   Oral   Take 1 tablet by mouth daily.          Marland Kitchen NITROGLYCERIN 0.4 MG SL SUBL   Sublingual   Place 0.4 mg under the tongue every 5 (five) minutes as needed. For chest pain         . PANTOPRAZOLE SODIUM 40 MG PO TBEC   Oral   Take 40 mg by mouth at bedtime.          Marland Kitchen POLYVINYL ALCOHOL 1.4 % OP SOLN   Both Eyes   Place 1 drop into both eyes daily as needed. For dryness         . PRESCRIPTION MEDICATION   Oral   Take 1 tablet by mouth daily. Oral antibiotic         . SENNOSIDES-DOCUSATE SODIUM 8.6-50 MG PO TABS   Oral   Take 1 tablet by mouth 2 (two) times daily.         Marland Kitchen SEVELAMER HCL 800 MG PO TABS   Oral   Take 2-4 tablets (1,600-3,200 mg total) by mouth See admin instructions. Take 4 tablets with meals and 2 tablets with snacks         . SILVER SULFADIAZINE 1 % EX CREA   Topical   Apply 1 application topically 2 (two) times daily. To incision site         . TETRAHYDROZOLINE HCL 0.05 % OP SOLN   Both Eyes   Place 1 drop into both eyes daily.         . TRAMADOL HCL 50 MG PO TABS   Oral   Take 50 mg by mouth every 8 (eight) hours as needed. For pain/neuropathy           BP 138/65  Pulse 82  Temp 99.3 F (37.4 C) (Oral)  Resp 20  SpO2 99%  Physical Exam  Vitals reviewed. Constitutional: She is oriented to person, place, and time. She appears well-developed and well-nourished. No distress.  HENT:  Head: Normocephalic and atraumatic.  Eyes: EOM are normal. Pupils are equal, round, and reactive to light.  Neck: Normal range of motion. Neck supple.  Cardiovascular: Normal rate and regular rhythm.   Pulmonary/Chest: Effort normal. No respiratory distress.  Abdominal: Soft. She exhibits no distension.    Musculoskeletal: Normal range of motion. She exhibits no edema.       Legs: Neurological: She is alert and oriented to person, place, and time.  Skin: Skin is warm and dry.  Psychiatric: She has a normal mood and affect. Her behavior is normal.    ED Course  Procedures (including critical care time)  Labs Reviewed  BASIC METABOLIC PANEL - Abnormal; Notable for the following:    Potassium 2.8 (*)     Glucose, Bld 140 (*)     BUN 4 (*)     Creatinine, Ser 1.81 (*)     GFR calc non Af Amer 30 (*)     GFR calc Af Amer 35 (*)     All other components within normal limits  CBC WITH DIFFERENTIAL - Abnormal; Notable for the following:    Hemoglobin 9.8 (*)  HCT 32.0 (*)     MCV 77.1 (*)     MCH 23.6 (*)     RDW 19.1 (*)     All other components within normal limits   Dg Femur Right  02/14/2012  *RADIOLOGY REPORT*  Clinical Data: Right leg amputation 2.5 months ago with revision 3 weeks ago, stone pain, diabetes, question continued infection  RIGHT FEMUR - 2 VIEW  Comparison: None  Findings: Severe osseous demineralization. Extensive atherosclerotic calcification. Visualized portion of the right pelvis intact. Distal right femoral above-knee amputation identified, with osseous margins fairly sharply defined. Ulcer at the lateral aspect of the stump is in close proximity to the femur without gross evidence of fracture or bone destruction. Soft tissue swelling of the stump.  IMPRESSION: Distal right femoral above-knee amputation. No gross evidence of osteomyelitis. Osseous demineralization.   Original Report Authenticated By: Ulyses Southward, M.D.      No diagnosis found.    MDM  Pt w/ recent aka revision now w/ purulent and bloody drainage from stump. Was seen by ortho yesterday and given silvadine cream and unknown abx. Plan for revision of stump in near future. Today started oozing bloody drainage. Denies fever/chills/sweats. Admits no vomiting x 3 months - no change.   Exam:  afebrile, well appearing, non toxic, stump reveals mucopurulent and bloody drainage, no fluctuant mass or erythema noted, not ttp.  Plan: concern for developing osteomyelitis vs abscess, doubt cellulitis. Will check bmp, cbc, xray right femur. Will start on vanc/zosyn  Course: reassessed, potassium 2.8 - given 40 of KCL, Hgb 9.8 - at baseline. Xray neg for gross evidence of osteomyelitis. D/c internal medicine and pt admitted for continued IV abx. Admit in stable condition  1. Amputation stump infection           Audelia Hives, MD 02/15/12 [phone]

## 2012-02-14 NOTE — ED Notes (Signed)
Attempted IV access x2; Second RN to attempt IV access;

## 2012-02-15 ENCOUNTER — Inpatient Hospital Stay (HOSPITAL_COMMUNITY): Payer: Medicare (Managed Care) | Admitting: Anesthesiology

## 2012-02-15 ENCOUNTER — Encounter (HOSPITAL_COMMUNITY): Payer: Self-pay | Admitting: Anesthesiology

## 2012-02-15 ENCOUNTER — Encounter (HOSPITAL_COMMUNITY)
Admission: EM | Disposition: A | Discharge status: Skilled Nursing Facility | Payer: Self-pay | Source: Home / Self Care | Attending: Internal Medicine

## 2012-02-15 DIAGNOSIS — T8740 Infection of amputation stump, unspecified extremity: Secondary | ICD-10-CM | POA: Diagnosis present

## 2012-02-15 DIAGNOSIS — E119 Type 2 diabetes mellitus without complications: Secondary | ICD-10-CM

## 2012-02-15 DIAGNOSIS — N186 End stage renal disease: Secondary | ICD-10-CM

## 2012-02-15 HISTORY — PX: I&D EXTREMITY: SHX5045

## 2012-02-15 LAB — CBC
Hemoglobin: 9.5 g/dL — ABNORMAL LOW (ref 12.0–15.0)
MCH: 23.3 pg — ABNORMAL LOW (ref 26.0–34.0)
Platelets: 350 10*3/uL (ref 150–400)
Platelets: 372 10*3/uL (ref 150–400)
RBC: 4.07 MIL/uL (ref 3.87–5.11)
RBC: 4.16 MIL/uL (ref 3.87–5.11)
RDW: 19.2 % — ABNORMAL HIGH (ref 11.5–15.5)
WBC: 7.8 10*3/uL (ref 4.0–10.5)
WBC: 7.7 10*3/uL (ref 4.0–10.5)

## 2012-02-15 LAB — GLUCOSE, CAPILLARY
Glucose-Capillary: 103 mg/dL — ABNORMAL HIGH (ref 70–99)
Glucose-Capillary: 61 mg/dL — ABNORMAL LOW (ref 70–99)
Glucose-Capillary: 70 mg/dL (ref 70–99)
Glucose-Capillary: 105 mg/dL — ABNORMAL HIGH (ref 70–99)

## 2012-02-15 LAB — BASIC METABOLIC PANEL
BUN: 8 mg/dL (ref 6–23)
CO2: 30 mEq/L (ref 19–32)
CO2: 29 mEq/L (ref 19–32)
Calcium: 9.6 mg/dL (ref 8.4–10.5)
Chloride: 95 mEq/L — ABNORMAL LOW (ref 96–112)
Chloride: 93 mEq/L — ABNORMAL LOW (ref 96–112)
Creatinine, Ser: 2.74 mg/dL — ABNORMAL HIGH (ref 0.50–1.10)
GFR calc Af Amer: 25 mL/min — ABNORMAL LOW (ref >90)
Potassium: 2.8 mEq/L — ABNORMAL LOW (ref 3.5–5.1)
Sodium: 136 mEq/L (ref 135–145)
Sodium: 134 mEq/L — ABNORMAL LOW (ref 135–145)

## 2012-02-15 LAB — CREATININE, SERUM
Creatinine, Ser: 2.19 mg/dL — ABNORMAL HIGH (ref 0.50–1.10)
GFR calc Af Amer: 28 mL/min — ABNORMAL LOW (ref >90)

## 2012-02-15 LAB — MRSA PCR SCREENING: MRSA by PCR: NEGATIVE

## 2012-02-15 SURGERY — IRRIGATION AND DEBRIDEMENT EXTREMITY
Anesthesia: General | Site: Leg Upper | Laterality: Right | Wound class: Clean

## 2012-02-15 MED ORDER — PIPERACILLIN-TAZOBACTAM IN DEX 2-0.25 GM/50ML IV SOLN
2.2500 g | Freq: Three times a day (TID) | INTRAVENOUS | Status: DC
  Administered 2012-02-15: 2.25 g via INTRAVENOUS
  Filled 2012-02-15 (×4): qty 50

## 2012-02-15 MED ORDER — INSULIN ASPART 100 UNIT/ML ~~LOC~~ SOLN: 4.0000 [IU] | Freq: Three times a day (TID) | SUBCUTANEOUS | Status: DC

## 2012-02-15 MED ORDER — PANTOPRAZOLE SODIUM 40 MG PO TBEC
40.0000 mg | DELAYED_RELEASE_TABLET | Freq: Every day | ORAL | Status: DC
  Administered 2012-02-15 – 2012-02-16 (×2): 40 mg via ORAL
  Filled 2012-02-15 (×3): qty 1

## 2012-02-15 MED ORDER — ACETAMINOPHEN 325 MG PO TABS: 650.0000 mg | ORAL_TABLET | Freq: Three times a day (TID) | ORAL | Status: DC | PRN

## 2012-02-15 MED ORDER — MIDAZOLAM HCL 5 MG/5ML IJ SOLN
INTRAMUSCULAR | Status: DC | PRN
  Administered 2012-02-15: 1 mg via INTRAVENOUS

## 2012-02-15 MED ORDER — SODIUM CHLORIDE 0.9 % IV SOLN
INTRAVENOUS | Status: DC | PRN
  Administered 2012-02-15: 14:00:00 via INTRAVENOUS

## 2012-02-15 MED ORDER — ACETAMINOPHEN ER 650 MG PO TBCR: 650.0000 mg | EXTENDED_RELEASE_TABLET | Freq: Three times a day (TID) | ORAL | Status: DC | PRN

## 2012-02-15 MED ORDER — FENTANYL CITRATE 0.05 MG/ML IJ SOLN
INTRAMUSCULAR | Status: DC | PRN
  Administered 2012-02-15 (×3): 50 ug via INTRAVENOUS

## 2012-02-15 MED ORDER — INSULIN ASPART 100 UNIT/ML ~~LOC~~ SOLN
0.0000 [IU] | Freq: Three times a day (TID) | SUBCUTANEOUS | Status: DC
  Administered 2012-02-16: 2 [IU] via SUBCUTANEOUS
  Administered 2012-02-16: 3 [IU] via SUBCUTANEOUS
  Administered 2012-02-16: 2 [IU] via SUBCUTANEOUS

## 2012-02-15 MED ORDER — WHITE PETROLATUM GEL
Status: AC
  Administered 2012-02-15: 0.2
  Filled 2012-02-15: qty 5

## 2012-02-15 MED ORDER — HYDROCODONE-ACETAMINOPHEN 5-325 MG PO TABS
1.0000 | ORAL_TABLET | Freq: Four times a day (QID) | ORAL | Status: DC | PRN
  Administered 2012-02-15 (×2): 1 via ORAL
  Filled 2012-02-15 (×2): qty 1

## 2012-02-15 MED ORDER — CINACALCET HCL 30 MG PO TABS
30.0000 mg | ORAL_TABLET | Freq: Every day | ORAL | Status: DC
  Administered 2012-02-15 – 2012-02-16 (×2): 30 mg via ORAL
  Filled 2012-02-15 (×4): qty 1

## 2012-02-15 MED ORDER — ONDANSETRON 4 MG PO TBDP
4.0000 mg | ORAL_TABLET | Freq: Three times a day (TID) | ORAL | Status: DC | PRN
  Administered 2012-02-16: 4 mg via ORAL
  Filled 2012-02-15: qty 1

## 2012-02-15 MED ORDER — SODIUM CHLORIDE 0.9 % IR SOLN
Status: DC | PRN
  Administered 2012-02-15: 3000 mL

## 2012-02-15 MED ORDER — SENNOSIDES-DOCUSATE SODIUM 8.6-50 MG PO TABS
1.0000 | ORAL_TABLET | Freq: Two times a day (BID) | ORAL | Status: DC
  Administered 2012-02-15 – 2012-02-16 (×3): 1 via ORAL
  Filled 2012-02-15 (×6): qty 1

## 2012-02-15 MED ORDER — ALBUTEROL SULFATE HFA 108 (90 BASE) MCG/ACT IN AERS: 2.0000 | INHALATION_SPRAY | RESPIRATORY_TRACT | Status: DC | PRN

## 2012-02-15 MED ORDER — SEVELAMER CARBONATE 800 MG PO TABS
3200.0000 mg | ORAL_TABLET | Freq: Three times a day (TID) | ORAL | Status: DC
  Administered 2012-02-15 – 2012-02-16 (×3): 3200 mg via ORAL
  Filled 2012-02-15 (×10): qty 4

## 2012-02-15 MED ORDER — METHOCARBAMOL 500 MG PO TABS: 500.0000 mg | ORAL_TABLET | Freq: Four times a day (QID) | ORAL | Status: DC | PRN

## 2012-02-15 MED ORDER — CINACALCET HCL 30 MG PO TABS: 90.0000 mg | ORAL_TABLET | Freq: Every evening | ORAL | Status: DC

## 2012-02-15 MED ORDER — HYDROMORPHONE HCL PF 1 MG/ML IJ SOLN
1.0000 mg | INTRAMUSCULAR | Status: DC | PRN
  Administered 2012-02-15: 1 mg via INTRAVENOUS
  Filled 2012-02-15: qty 1

## 2012-02-15 MED ORDER — HEPARIN SODIUM (PORCINE) 5000 UNIT/ML IJ SOLN
5000.0000 [IU] | Freq: Three times a day (TID) | INTRAMUSCULAR | Status: DC
  Filled 2012-02-15: qty 1

## 2012-02-15 MED ORDER — SILVER SULFADIAZINE 1 % EX CREA
1.0000 "application " | TOPICAL_CREAM | Freq: Two times a day (BID) | CUTANEOUS | Status: DC
  Filled 2012-02-15: qty 85

## 2012-02-15 MED ORDER — SILVER SULFADIAZINE 1 % EX CREA: 1.0000 "application " | TOPICAL_CREAM | Freq: Two times a day (BID) | CUTANEOUS | Status: DC

## 2012-02-15 MED ORDER — TRAMADOL HCL 50 MG PO TABS
50.0000 mg | ORAL_TABLET | Freq: Three times a day (TID) | ORAL | Status: DC | PRN
  Filled 2012-02-15: qty 1

## 2012-02-15 MED ORDER — POLYVINYL ALCOHOL 1.4 % OP SOLN: 1.0000 [drp] | Freq: Every day | OPHTHALMIC | Status: DC | PRN

## 2012-02-15 MED ORDER — HYDROMORPHONE HCL PF 1 MG/ML IJ SOLN
INTRAMUSCULAR | Status: AC
  Filled 2012-02-15: qty 1

## 2012-02-15 MED ORDER — EZETIMIBE 10 MG PO TABS
10.0000 mg | ORAL_TABLET | Freq: Every day | ORAL | Status: DC
  Administered 2012-02-15 – 2012-02-17 (×3): 10 mg via ORAL
  Filled 2012-02-15 (×3): qty 1

## 2012-02-15 MED ORDER — GABAPENTIN 300 MG PO CAPS
300.0000 mg | ORAL_CAPSULE | Freq: Every day | ORAL | Status: DC
  Administered 2012-02-16: 300 mg via ORAL
  Filled 2012-02-15 (×3): qty 1

## 2012-02-15 MED ORDER — CINACALCET HCL 30 MG PO TABS
90.0000 mg | ORAL_TABLET | Freq: Every evening | ORAL | Status: DC
  Administered 2012-02-15 – 2012-02-16 (×2): 90 mg via ORAL
  Filled 2012-02-15 (×3): qty 3

## 2012-02-15 MED ORDER — FENOFIBRATE 160 MG PO TABS
160.0000 mg | ORAL_TABLET | Freq: Every day | ORAL | Status: DC
  Administered 2012-02-15 – 2012-02-17 (×3): 160 mg via ORAL
  Filled 2012-02-15 (×3): qty 1

## 2012-02-15 MED ORDER — HEPARIN SODIUM (PORCINE) 5000 UNIT/ML IJ SOLN
5000.0000 [IU] | Freq: Three times a day (TID) | INTRAMUSCULAR | Status: DC
  Administered 2012-02-15 – 2012-02-17 (×6): 5000 [IU] via SUBCUTANEOUS
  Filled 2012-02-15 (×9): qty 1

## 2012-02-15 MED ORDER — POTASSIUM CHLORIDE CRYS ER 20 MEQ PO TBCR
40.0000 meq | EXTENDED_RELEASE_TABLET | ORAL | Status: DC
  Administered 2012-02-15: 40 meq via ORAL
  Filled 2012-02-15: qty 2

## 2012-02-15 MED ORDER — HYDROMORPHONE HCL PF 2 MG/ML IJ SOLN: 1.0000 mg | INTRAMUSCULAR | Status: DC | PRN

## 2012-02-15 MED ORDER — HYDROMORPHONE HCL PF 1 MG/ML IJ SOLN
1.0000 mg | INTRAMUSCULAR | Status: AC | PRN
  Administered 2012-02-15: 1 mg via INTRAVENOUS
  Filled 2012-02-15: qty 1

## 2012-02-15 MED ORDER — HYDROMORPHONE HCL PF 1 MG/ML IJ SOLN
0.2500 mg | INTRAMUSCULAR | Status: DC | PRN
  Administered 2012-02-15 (×3): 0.5 mg via INTRAVENOUS

## 2012-02-15 MED ORDER — PROMETHAZINE HCL 25 MG/ML IJ SOLN: 6.2500 mg | INTRAMUSCULAR | Status: DC | PRN

## 2012-02-15 MED ORDER — GABAPENTIN 300 MG PO CAPS: 300.0000 mg | ORAL_CAPSULE | Freq: Every day | ORAL | Status: DC

## 2012-02-15 MED ORDER — PROPOFOL 10 MG/ML IV BOLUS
INTRAVENOUS | Status: DC | PRN
  Administered 2012-02-15: 100 mg via INTRAVENOUS

## 2012-02-15 MED ORDER — LIDOCAINE HCL (CARDIAC) 20 MG/ML IV SOLN
INTRAVENOUS | Status: DC | PRN
  Administered 2012-02-15: 60 mg via INTRAVENOUS

## 2012-02-15 MED ORDER — ASPIRIN EC 81 MG PO TBEC
81.0000 mg | DELAYED_RELEASE_TABLET | Freq: Every day | ORAL | Status: DC
  Administered 2012-02-15 – 2012-02-17 (×3): 81 mg via ORAL
  Filled 2012-02-15 (×3): qty 1

## 2012-02-15 MED ORDER — SODIUM CHLORIDE 0.9 % IV SOLN: INTRAVENOUS | Status: AC

## 2012-02-15 MED ORDER — PANTOPRAZOLE SODIUM 40 MG PO TBEC: 40.0000 mg | DELAYED_RELEASE_TABLET | Freq: Every day | ORAL | Status: DC

## 2012-02-15 MED ORDER — PIPERACILLIN-TAZOBACTAM IN DEX 2-0.25 GM/50ML IV SOLN
2.2500 g | Freq: Three times a day (TID) | INTRAVENOUS | Status: DC
  Administered 2012-02-17: 2.25 g via INTRAVENOUS
  Filled 2012-02-15 (×8): qty 50

## 2012-02-15 MED ORDER — ONDANSETRON HCL 4 MG/2ML IJ SOLN: 4.0000 mg | Freq: Three times a day (TID) | INTRAMUSCULAR | Status: AC | PRN

## 2012-02-15 MED ORDER — HYDROMORPHONE HCL PF 1 MG/ML IJ SOLN
1.0000 mg | INTRAMUSCULAR | Status: DC | PRN
  Administered 2012-02-16 (×2): 1 mg via INTRAMUSCULAR
  Filled 2012-02-15 (×2): qty 1

## 2012-02-15 MED ORDER — OXYCODONE-ACETAMINOPHEN 5-325 MG PO TABS
2.0000 | ORAL_TABLET | ORAL | Status: DC | PRN
  Administered 2012-02-16 – 2012-02-17 (×7): 2 via ORAL
  Filled 2012-02-15 (×7): qty 2

## 2012-02-15 MED ORDER — METHOCARBAMOL 500 MG PO TABS
500.0000 mg | ORAL_TABLET | Freq: Four times a day (QID) | ORAL | Status: DC | PRN
  Filled 2012-02-15: qty 1

## 2012-02-15 MED ORDER — GUAIFENESIN 100 MG/5ML PO SOLN
15.0000 mL | Freq: Four times a day (QID) | ORAL | Status: DC | PRN
  Filled 2012-02-15: qty 15

## 2012-02-15 MED ORDER — TRAMADOL HCL 50 MG PO TABS: 50.0000 mg | ORAL_TABLET | Freq: Three times a day (TID) | ORAL | Status: DC | PRN

## 2012-02-15 MED ORDER — VANCOMYCIN HCL 500 MG IV SOLR
500.0000 mg | Freq: Once | INTRAVENOUS | Status: DC
  Filled 2012-02-15: qty 500

## 2012-02-15 MED ORDER — DIPHENHYDRAMINE HCL 50 MG/ML IJ SOLN
25.0000 mg | Freq: Once | INTRAMUSCULAR | Status: AC
  Administered 2012-02-15: 25 mg via INTRAVENOUS
  Filled 2012-02-15: qty 1

## 2012-02-15 MED ORDER — RENA-VITE PO TABS
1.0000 | ORAL_TABLET | Freq: Every day | ORAL | Status: DC
  Administered 2012-02-16: 1 via ORAL
  Filled 2012-02-15 (×3): qty 1

## 2012-02-15 MED ORDER — ONDANSETRON HCL 4 MG/2ML IJ SOLN
INTRAMUSCULAR | Status: DC | PRN
  Administered 2012-02-15: 4 mg via INTRAVENOUS

## 2012-02-15 MED ORDER — POTASSIUM CHLORIDE 10 MEQ/100ML IV SOLN
10.0000 meq | INTRAVENOUS | Status: AC
  Administered 2012-02-15 (×2): 10 meq via INTRAVENOUS
  Filled 2012-02-15 (×2): qty 100

## 2012-02-15 SURGICAL SUPPLY — 67 items
BANDAGE CONFORM 3  STR LF (GAUZE/BANDAGES/DRESSINGS) IMPLANT
BANDAGE ELASTIC 3 VELCRO ST LF (GAUZE/BANDAGES/DRESSINGS) IMPLANT
BANDAGE ELASTIC 6 VELCRO ST LF (GAUZE/BANDAGES/DRESSINGS) ×2 IMPLANT
BANDAGE GAUZE ELAST BULKY 4 IN (GAUZE/BANDAGES/DRESSINGS) ×2 IMPLANT
BLADE SAW SGTL 18.5X63.X.64 HD (BLADE) ×2 IMPLANT
BLADE SURG 10 STRL SS (BLADE) ×2 IMPLANT
BNDG COHESIVE 1X5 TAN STRL LF (GAUZE/BANDAGES/DRESSINGS) IMPLANT
BNDG COHESIVE 4X5 TAN STRL (GAUZE/BANDAGES/DRESSINGS) ×2 IMPLANT
BNDG COHESIVE 6X5 TAN STRL LF (GAUZE/BANDAGES/DRESSINGS) ×4 IMPLANT
BNDG GAUZE STRTCH 6 (GAUZE/BANDAGES/DRESSINGS) ×6 IMPLANT
CLOTH BEACON ORANGE TIMEOUT ST (SAFETY) ×2 IMPLANT
CORDS BIPOLAR (ELECTRODE) IMPLANT
COVER SURGICAL LIGHT HANDLE (MISCELLANEOUS) ×2 IMPLANT
CUFF TOURNIQUET SINGLE 18IN (TOURNIQUET CUFF) ×2 IMPLANT
CUFF TOURNIQUET SINGLE 24IN (TOURNIQUET CUFF) IMPLANT
CUFF TOURNIQUET SINGLE 34IN LL (TOURNIQUET CUFF) IMPLANT
CUFF TOURNIQUET SINGLE 44IN (TOURNIQUET CUFF) IMPLANT
DRAPE ORTHO SPLIT 77X108 STRL (DRAPES) ×2
DRAPE SURG 17X23 STRL (DRAPES) IMPLANT
DRAPE SURG ORHT 6 SPLT 77X108 (DRAPES) ×2 IMPLANT
DRAPE U-SHAPE 47X51 STRL (DRAPES) ×2 IMPLANT
DRSG ADAPTIC 3X8 NADH LF (GAUZE/BANDAGES/DRESSINGS) ×2 IMPLANT
DRSG PAD ABDOMINAL 8X10 ST (GAUZE/BANDAGES/DRESSINGS) ×2 IMPLANT
DURAPREP 26ML APPLICATOR (WOUND CARE) ×2 IMPLANT
ELECT CAUTERY BLADE 6.4 (BLADE) IMPLANT
ELECT REM PT RETURN 9FT ADLT (ELECTROSURGICAL)
ELECTRODE REM PT RTRN 9FT ADLT (ELECTROSURGICAL) IMPLANT
GAUZE XEROFORM 1X8 LF (GAUZE/BANDAGES/DRESSINGS) ×2 IMPLANT
GAUZE XEROFORM 5X9 LF (GAUZE/BANDAGES/DRESSINGS) ×2 IMPLANT
GLOVE BIO SURGEON STRL SZ 6.5 (GLOVE) ×2 IMPLANT
GLOVE BIO SURGEON STRL SZ8 (GLOVE) ×2 IMPLANT
GLOVE BIOGEL PI IND STRL 8 (GLOVE) ×2 IMPLANT
GLOVE BIOGEL PI INDICATOR 8 (GLOVE) ×2
GLOVE ORTHO TXT STRL SZ7.5 (GLOVE) ×4 IMPLANT
GOWN PREVENTION PLUS LG XLONG (DISPOSABLE) IMPLANT
GOWN PREVENTION PLUS XLARGE (GOWN DISPOSABLE) ×2 IMPLANT
GOWN STRL NON-REIN LRG LVL3 (GOWN DISPOSABLE) ×2 IMPLANT
HANDPIECE INTERPULSE COAX TIP (DISPOSABLE)
KIT BASIN OR (CUSTOM PROCEDURE TRAY) ×2 IMPLANT
KIT ROOM TURNOVER OR (KITS) ×2 IMPLANT
MANIFOLD NEPTUNE II (INSTRUMENTS) ×2 IMPLANT
NS IRRIG 1000ML POUR BTL (IV SOLUTION) ×2 IMPLANT
PACK ORTHO EXTREMITY (CUSTOM PROCEDURE TRAY) ×2 IMPLANT
PAD ARMBOARD 7.5X6 YLW CONV (MISCELLANEOUS) ×4 IMPLANT
PADDING CAST ABS 4INX4YD NS (CAST SUPPLIES) ×2
PADDING CAST ABS COTTON 4X4 ST (CAST SUPPLIES) ×2 IMPLANT
PADDING CAST COTTON 6X4 STRL (CAST SUPPLIES) ×2 IMPLANT
SET HNDPC FAN SPRY TIP SCT (DISPOSABLE) IMPLANT
SPONGE GAUZE 4X4 12PLY (GAUZE/BANDAGES/DRESSINGS) ×2 IMPLANT
SPONGE LAP 18X18 X RAY DECT (DISPOSABLE) ×2 IMPLANT
STOCKINETTE IMPERVIOUS 9X36 MD (GAUZE/BANDAGES/DRESSINGS) ×2 IMPLANT
SUT ETHILON 2 0 FS 18 (SUTURE) ×6 IMPLANT
SUT ETHILON 2 0 PSLX (SUTURE) ×4 IMPLANT
SUT ETHILON 3 0 PS 1 (SUTURE) ×4 IMPLANT
SUT VIC AB 0 CT1 27 (SUTURE) ×2
SUT VIC AB 0 CT1 27XBRD ANBCTR (SUTURE) ×2 IMPLANT
SUT VIC AB 2-0 CT1 27 (SUTURE) ×2
SUT VIC AB 2-0 CT1 TAPERPNT 27 (SUTURE) ×2 IMPLANT
SYR CONTROL 10ML LL (SYRINGE) IMPLANT
TOWEL OR 17X24 6PK STRL BLUE (TOWEL DISPOSABLE) ×2 IMPLANT
TOWEL OR 17X26 10 PK STRL BLUE (TOWEL DISPOSABLE) ×2 IMPLANT
TUBE ANAEROBIC SPECIMEN COL (MISCELLANEOUS) IMPLANT
TUBE CONNECTING 12X1/4 (SUCTIONS) ×2 IMPLANT
TUBE FEEDING 5FR 15 INCH (TUBING) IMPLANT
UNDERPAD 30X30 INCONTINENT (UNDERPADS AND DIAPERS) ×2 IMPLANT
WATER STERILE IRR 1000ML POUR (IV SOLUTION) ×2 IMPLANT
YANKAUER SUCT BULB TIP NO VENT (SUCTIONS) ×2 IMPLANT

## 2012-02-15 NOTE — Progress Notes (Signed)
Repeat cbg 70

## 2012-02-15 NOTE — ED Provider Notes (Signed)
I saw and evaluated the patient, reviewed the resident's note and I agree with the findings and plan.  The patient has a history of DM, status post aka of the right leg.  She was seen by Dr. Lajoyce Corners yesterday and he felt as though all was well.  She was given keflex for possible infection.  This afternoon she noticed bleeding and purulent drainage from the stump.  No fevers or chills.  No other complaints.    On exam, the patient is afebrile and the vitals are stable. The heart and lung exam is normal.  The abd is benign.  The right le is noted to have breakdown of the incision with purulent and serous drainage present.  There is also warmth and erythema present.  This appears to be a wound infection/dehissence.  Medicine has been consulted for admission for iv antibiotics.  We have given vanc and zosyn awaiting admission.  Geoffery Lyons, MD 02/15/12 [phone]

## 2012-02-15 NOTE — Progress Notes (Signed)
Hypoglycemic Event  CBG: 61  Treatment: 15 GM carbohydrate snack  Symptoms: None  Follow-up CBG: Time:0830 CBG Result:73  Possible Reasons for Event: Unknown  Comments/MD notified:Dr Burtis Junes    Maple Mirza  Remember to initiate Hypoglycemia Order Set & complete

## 2012-02-15 NOTE — Anesthesia Postprocedure Evaluation (Signed)
  Anesthesia Post-op Note  Patient: Faith [name]  Procedure(s) Performed: Procedure(s) (LRB) with comments: IRRIGATION AND DEBRIDEMENT EXTREMITY (Right)  Patient Location: PACU  Anesthesia Type:General  Level of Consciousness: awake  Airway and Oxygen Therapy: Patient Spontanous Breathing  Post-op Pain: mild  Post-op Assessment: Post-op Vital signs reviewed, Patient's Cardiovascular Status Stable, Respiratory Function Stable, Patent Airway, No signs of Nausea or vomiting and Pain level controlled  Post-op Vital Signs: stable  Complications: No apparent anesthesia complications

## 2012-02-15 NOTE — Progress Notes (Signed)
Pt has functioning rt arm fistula with +thrill, bruit.

## 2012-02-15 NOTE — ED Notes (Signed)
Pharmacy notified about pts reaction to vancomycin; pharmacy states 25mg  of benadryl and slowing down rate by half would resolve pt's discomfort.

## 2012-02-15 NOTE — Brief Op Note (Signed)
02/14/2012 - 02/15/2012  3:33 PM  PATIENT:  Faith [name]  56 y.o. female  PRE-OPERATIVE DIAGNOSIS:  dehiscence of aka   POST-OPERATIVE DIAGNOSIS:  dehiscence of aka   PROCEDURE:  Procedure(s) (LRB) with comments: IRRIGATION AND DEBRIDEMENT EXTREMITY (Right)  SURGEON:  Surgeon(s) and Role:    * Kathryne Hitch, MD - Primary  PHYSICIAN ASSISTANT:   ASSISTANTS: none   ANESTHESIA:   general  EBL:  Total I/O In: 375 [I.V.:375] Out: 125 [Blood:125]  BLOOD ADMINISTERED:none  DRAINS: none   LOCAL MEDICATIONS USED:  NONE  SPECIMEN:  No Specimen  DISPOSITION OF SPECIMEN:  N/A  COUNTS:  YES  TOURNIQUET:  * Missing tourniquet times found for documented tourniquets in log:  [id number] *  DICTATION: .Other Dictation: Dictation Number [id number]  PLAN OF CARE: Admit to inpatient   PATIENT DISPOSITION:  PACU - hemodynamically stable.   Delay start of Pharmacological VTE agent (>24hrs) due to surgical blood loss or risk of bleeding: no

## 2012-02-15 NOTE — Op Note (Signed)
Faith [name], Faith [name] NO.:  [id number]  MEDICAL RECORD NO.:  [id number]  LOCATION:  6713                         FACILITY:  [facility]  PHYSICIAN:  Vanita Panda. Magnus Ivan, M.D.DATE OF BIRTH:  10-15-1955  DATE OF PROCEDURE:  02/15/2012 DATE OF DISCHARGE:                              OPERATIVE REPORT   PREOPERATIVE DIAGNOSIS:  Right above-knee amputation wound dehiscence/breakdown and infection.  POSTOPERATIVE DIAGNOSIS:  Right above-knee amputation wound dehiscence/breakdown and infection.  PROCEDURE: 1. Irrigation and debridement of right above-knee amputation stump     including debridement of skin, soft tissue, muscle, and bone. 2. Revision above-knee amputation, right leg.  SURGEON:  Vanita Panda. Magnus Ivan, M.D.  ANESTHESIA:  General.  BLOOD LOSS:  Less than 500 mL.  COMPLICATIONS:  None.  INDICATION:  [name] is a 56 year old female with end-stage renal disease and peripheral vascular disease who underwent an above-knee amputation around a month ago.  She presented to our office on Monday and started to develop some wound dehiscence and was treated by another physician with local wound care and Silvadene cream.  Two days later now, the wound has completely and had some greenish drainage.  The patient has not been septic appearing, but was admitted to the Medicine Service appropriately for antibiotics and then for surgical dressing of her wound.  I talked to her and her husband at length and they understood the need to debride the necrotic tissue from her leg and did likely backup the amputation.  The risks and benefits of the surgery were explained to them in detail, and they understood why we were proceeding with surgery.  PROCEDURE DESCRIPTION:  After informed consent was obtained, the appropriate right above-knee amputation stump was marked.  She was brought to the operating room, placed supine on the operating table. General anesthesia was  then obtained.  Her right above-knee amputation stump was prepped and draped with Betadine scrub and paint and sterile drapes as well.  Time-out was called and she was identified as correct patient, correct right leg.  I then used a #10 blade and sharply removed the end of the skin and soft tissue.  I used a rongeur and a blade to remove necrotic tissue all the way down to the bone.  I then used oscillating saw to backup the bone at least 2.5 cm.  Hemostasis was obtained with electrocautery and after sharp debridement of skin, soft tissue, muscle, and bone with a knife, I used pulsatile lavage and 3 L of normal saline solution to lavage through the wound.  I did not find any gross purulence at all.  I then closed the deep tissue with 0-Vicryl suture followed by 2-0 Vicryl in the subcutaneous tissue, interrupted 2-0 nylon on the skin, and I was able to get a closure of the wound albeit it is still a little tight and tenuous. Xeroform and well-padded sterile dressing was applied.  The patient was awakened, extubated, and taken to the recovery room in stable condition. All final counts were correct.  There were no complications noted.     Vanita Panda. Magnus Ivan, M.D.     CYB/MEDQ  D:  02/15/2012  T:  02/15/2012  Job:  [id number] 

## 2012-02-15 NOTE — Anesthesia Preprocedure Evaluation (Signed)
Anesthesia Evaluation  Patient identified by MRN, date of birth, ID band Patient awake    Reviewed: Allergy & Precautions, H&P , NPO status , Patient's Chart, lab work & pertinent test results  History of Anesthesia Complications (+) PROLONGED EMERGENCE  Airway Mallampati: II TM Distance: >3 FB Neck ROM: Full    Dental  (+) Edentulous Upper, Edentulous Lower and Dental Advisory Given   Pulmonary pneumonia -, resolved,  breath sounds clear to auscultation  Pulmonary exam normal       Cardiovascular hypertension, + CAD, + Cardiac Stents, + CABG and + Peripheral Vascular Disease Rate:Normal     Neuro/Psych PSYCHIATRIC DISORDERS Depression CVA, Residual Symptoms    GI/Hepatic Neg liver ROS, GERD-  Medicated,  Endo/Other  diabetes, Insulin Dependent  Renal/GU DialysisRenal disease     Musculoskeletal   Abdominal   Peds  Hematology   Anesthesia Other Findings   Reproductive/Obstetrics                           Anesthesia Physical Anesthesia Plan  ASA: IV and emergent  Anesthesia Plan: General   Post-op Pain Management:    Induction: Intravenous  Airway Management Planned: LMA  Additional Equipment:   Intra-op Plan:   Post-operative Plan: Extubation in OR  Informed Consent: I have reviewed the patients History and Physical, chart, labs and discussed the procedure including the risks, benefits and alternatives for the proposed anesthesia with the patient or authorized representative who has indicated his/her understanding and acceptance.     Plan Discussed with: CRNA and Surgeon  Anesthesia Plan Comments:         Anesthesia Quick Evaluation

## 2012-02-15 NOTE — Progress Notes (Addendum)
ANTIBIOTIC CONSULT NOTE - INITIAL  Pharmacy Consult for vancomycin and Zosyn Indication: Possible AKA stump infection  Allergies  Allergen Reactions  . Lipitor (Atorvastatin Calcium) Other (See Comments)    Muscle weakness  . Codeine Nausea And Vomiting  . Metoclopramide Hcl Other (See Comments)    Tardive dyskinesia    Patient Measurements: Height: 5' 4.57" (164 cm) Weight: 149 lb 14.6 oz (68 kg) (Per 01/23/12 documentation) IBW/kg (Calculated) : 56   Vital Signs: Temp: 99.3 F (37.4 C) (12/24 2030) Temp src: Oral (12/24 2030) BP: 95/65 mmHg (12/25 0059) Pulse Rate: 87  (12/25 0059) Intake/Output from previous day:   Intake/Output from this shift:    Labs:  [facility] 02/14/12 1829  WBC 8.0  HGB 9.8*  PLT 340  LABCREA --  CREATININE 1.81*   Estimated Creatinine Clearance: 32.9 ml/min (by C-G formula based on Cr of 1.81). No results found for this basename: VANCOTROUGH:2,VANCOPEAK:2,VANCORANDOM:2,GENTTROUGH:2,GENTPEAK:2,GENTRANDOM:2,TOBRATROUGH:2,TOBRAPEAK:2,TOBRARND:2,AMIKACINPEAK:2,AMIKACINTROU:2,AMIKACIN:2, in the last 72 hours   Microbiology: Recent Results (from the past 720 hour(s))  SURGICAL PCR SCREEN     Status: Normal   Collection Time   01/17/12  8:59 AM      Component Value Range Status Comment   MRSA, PCR NEGATIVE  NEGATIVE Final    Staphylococcus aureus NEGATIVE  NEGATIVE Final     Medical History: Past Medical History  Diagnosis Date  . Hyperparathyroidism, secondary renal   . CAD (coronary artery disease) of artery bypass graft     CABG in 2002  . PAD (peripheral artery disease)     Not a candidate for revascularization  . Chronic constipation   . GERD (gastroesophageal reflux disease)   . Anemia of chronic renal failure   . Tardive dyskinesia     Secondary to Reglan  . Depression   . Neuropathy   . Diabetic retinopathy(362.0)   . Diastolic CHF, chronic     Grade 2  . Pulmonary embolism 06/2003  . Hyperlipidemia   . Stroke 2002   "w/left side" residual weakness  . Diabetes mellitus     type II  . CHF (congestive heart failure)   . CVA (cerebral infarction)     right brain with minimal left hemiparesis  . Pneumonia 10/2009; 11/2009    ?pneumonia 01/17/11  . Hypertension   . Arthritis   . Complication of anesthesia     "sleepy afterwards; due to her being dialysis pt"  . Myocardial infarction 2002  . ESRD on dialysis     [name]; M, W, F; via right upper arm AVF (01/18/2012)    Medications:  Scheduled:    . aspirin EC  81 mg Oral Daily  . cinacalcet  30 mg Oral Q breakfast  . cinacalcet  90 mg Oral QPM  . ezetimibe  10 mg Oral Daily  . fenofibrate  160 mg Oral Daily  . gabapentin  300 mg Oral QHS  . heparin  5,000 Units Subcutaneous Q8H  . [COMPLETED] HYDROcodone-acetaminophen  2 tablet Oral Once  . insulin aspart  4-12 Units Subcutaneous TID AC  . multivitamin  1 tablet Oral Daily  . pantoprazole  40 mg Oral QHS  . [COMPLETED] potassium chloride SA  40 mEq Oral Once  . senna-docusate  1 tablet Oral BID  . sevelamer  3,200 mg Oral TID WC  . silver sulfADIAZINE  1 application Topical BID  . vancomycin  1,000 mg Intravenous Once   Assessment: 56 yo female admitted with purulent and bloody drainage from AKA stump. Patient with  recent AKA right leg (01/17/12). Pharmacy to manage vancomycin and Zosyn. Patient has already received vancomycin 1gm IV  and Zosyn 3.375gm IV x 1.   Goal of Therapy:  Vancomycin pre-HD level 15-25 mcg/mL   Plan:  1. Zosyn 2.25gm IV Q8H. 2. Vancomycin 500mg  IV x 1 (total of 1.5gm loading dose). 3. Follow-up HD schedule for further vancomycin dosing.   Emeline Gins 02/15/2012,1:25 AM

## 2012-02-15 NOTE — H&P (Signed)
[facility] History and Physical  BROOKS STOTZ XBM:[id number] DOB: 1955/11/25 DOA: 02/14/2012  Referring physician: ED PCP: Thane Edu, MD  Specialists: Nephrology, Dr. Lajoyce Corners orthopaedics  Chief Complaint: Stump infection  HPI: Faith [name] is a 56 y.o. female who recently had an amputation of her RLE stump here in the hospital performed by Dr. Lajoyce Corners.  Unfortunately over the past few days they have noticed increased purulent drainage from the incision on the stump site.  Patient went to Dr. Audrie Lia office and they started a PO antibiotic yesterday.  Unfortunately things "took a turn for the worse" rapidly, and the wound began to open up further with increasing purulent drainage and bleeding.  As a result they presented to the ED today.  In the ED X Ray didn't show any obvious evidence of osteomyelitis, and no signs or symptoms of SIRS on eval, but the wound is clearly infected and draining.  Hospitalist has been asked to admit for IV abx, zosyn and vanc started in ED.  Review of Systems: 12 systems reviewed and otherwise negative.  Past Medical History  Diagnosis Date  . Hyperparathyroidism, secondary renal   . CAD (coronary artery disease) of artery bypass graft     CABG in 2002  . PAD (peripheral artery disease)     Not a candidate for revascularization  . Chronic constipation   . GERD (gastroesophageal reflux disease)   . Anemia of chronic renal failure   . Tardive dyskinesia     Secondary to Reglan  . Depression   . Neuropathy   . Diabetic retinopathy(362.0)   . Diastolic CHF, chronic     Grade 2  . Pulmonary embolism 06/2003  . Hyperlipidemia   . Stroke 2002    "w/left side" residual weakness  . Diabetes mellitus     type II  . CHF (congestive heart failure)   . CVA (cerebral infarction)     right brain with minimal left hemiparesis  . Pneumonia 10/2009; 11/2009    ?pneumonia 01/17/11  . Hypertension   . Arthritis   . Complication of anesthesia      "sleepy afterwards; due to her being dialysis pt"  . Myocardial infarction 2002  . ESRD on dialysis     [name]; M, W, F; via right upper arm AVF (01/18/2012)   Past Surgical History  Procedure Date  . Coronary artery bypass graft 2002  . Laparoscopic cholecystectomy 2002  . Av fistula placement     times 4-using rt upper arm graft-old rt lower arm,lt upper and lower old grafts  . Trigger finger release   . Tubal ligation   . Eye examination under anesthesia w/ retinal cryotherapy and retinal laser   . Cataract extraction 07/2010    right sided   . Toe amputation 5/12    left 2nd toe secondary to osteomyelitis  . Breast biopsy 06/2010  . Carpal tunnel release 05/05/2011    Procedure: CARPAL TUNNEL RELEASE;  Surgeon: Sharma Covert, MD;  Location: [facility];  Service: Orthopedics;  Laterality: Right;  . Coronary angioplasty with stent placement   . Amputation 11/24/2011    Procedure: AMPUTATION RAY;  Surgeon: Nadara Mustard, MD;  Location: [facility] OR;  Service: Orthopedics;  Laterality: Right;  Right Foot 2nd Ray Amputation  . Amputation 12/12/2011    Procedure: AMPUTATION BELOW KNEE;  Surgeon: Nadara Mustard, MD;  Location: [facility] OR;  Service: Orthopedics;  Laterality: Right;  . Above knee leg amputation 01/17/2012  right  . Amputation 01/17/2012    Procedure: AMPUTATION ABOVE KNEE;  Surgeon: Nadara Mustard, MD;  Location: [facility] OR;  Service: Orthopedics;  Laterality: Right;  Right Above Knee Amputation   Social History:  reports that she has never smoked. She has never used smokeless tobacco. She reports that she drinks alcohol. She reports that she uses illicit drugs (Marijuana).   Allergies  Allergen Reactions  . Lipitor (Atorvastatin Calcium) Other (See Comments)    Muscle weakness  . Codeine Nausea And Vomiting  . Metoclopramide Hcl Other (See Comments)    Tardive dyskinesia    Family History  Problem Relation Age of Onset  . Diabetes Mother   . Cirrhosis  Mother   . Diabetes Father   . Heart attack Father   . Hyperlipidemia Father   . Heart disease Father   . Diabetes Sister   . Hyperlipidemia Sister   . Hypertension Sister   . Diabetes Sister   . Diabetes Sister   . Heart disease Brother   . Hyperlipidemia Brother     Prior to Admission medications   Medication Sig Start Date End Date Taking? Authorizing Provider  acetaminophen (TYLENOL ARTHRITIS PAIN) 650 MG CR tablet Take 650 mg by mouth every 8 (eight) hours as needed. For pain   Yes Historical Provider, MD  albuterol (PROVENTIL HFA;VENTOLIN HFA) 108 (90 BASE) MCG/ACT inhaler Inhale 2 puffs into the lungs every 4 (four) hours as needed. For shortness of breath   Yes Historical Provider, MD  aspirin EC 81 MG tablet Take 81 mg by mouth daily.   Yes Historical Provider, MD  cinacalcet (SENSIPAR) 30 MG tablet Take 30 mg by mouth daily with breakfast.   Yes Historical Provider, MD  cinacalcet (SENSIPAR) 90 MG tablet Take 90 mg by mouth every evening.   Yes Historical Provider, MD  ezetimibe (ZETIA) 10 MG tablet Take 10 mg by mouth daily.   Yes Historical Provider, MD  fenofibrate micronized (LOFIBRA) 134 MG capsule Take 134 mg by mouth daily.    Yes Historical Provider, MD  gabapentin (NEURONTIN) 300 MG capsule Take 1 capsule (300 mg total) by mouth at bedtime. Note decreased dose. 01/04/12  Yes Evlyn Kanner Love, PA  guaiFENesin (ROBITUSSIN) 100 MG/5ML SOLN Take 15 mLs by mouth every 6 (six) hours as needed. For coughing/wheezing   Yes Historical Provider, MD  HYDROcodone-acetaminophen (VICODIN) 5-500 MG per tablet Take 1 tablet by mouth every 6 (six) hours as needed. For pain 01/23/12  Yes Nadara Mustard, MD  insulin aspart (NOVOLOG) 100 UNIT/ML injection Inject 4-12 Units into the skin 3 (three) times daily before meals. Per sliding scale   Yes Historical Provider, MD  methocarbamol (ROBAXIN) 500 MG tablet Take 500 mg by mouth every 6 (six) hours as needed. For muscle spasms 01/04/12  Yes  Evlyn Kanner Love, PA  multivitamin (RENA-VIT) TABS tablet Take 1 tablet by mouth daily.    Yes Historical Provider, MD  nitroGLYCERIN (NITROSTAT) 0.4 MG SL tablet Place 0.4 mg under the tongue every 5 (five) minutes as needed. For chest pain   Yes Historical Provider, MD  pantoprazole (PROTONIX) 40 MG tablet Take 40 mg by mouth at bedtime.    Yes Historical Provider, MD  polyvinyl alcohol (LIQUIFILM TEARS) 1.4 % ophthalmic solution Place 1 drop into both eyes daily as needed. For dryness   Yes Historical Provider, MD  PRESCRIPTION MEDICATION Take 1 tablet by mouth daily. Oral antibiotic   Yes Historical Provider, MD  senna-docusate (SENOKOT-S)  8.6-50 MG per tablet Take 1 tablet by mouth 2 (two) times daily. 01/04/12  Yes Evlyn Kanner Love, PA  sevelamer (RENAGEL) 800 MG tablet Take 2-4 tablets (1,600-3,200 mg total) by mouth See admin instructions. Take 4 tablets with meals and 2 tablets with snacks 01/04/12  Yes Evlyn Kanner Love, PA  silver sulfADIAZINE (SILVADENE) 1 % cream Apply 1 application topically 2 (two) times daily. To incision site   Yes Historical Provider, MD  tetrahydrozoline 0.05 % ophthalmic solution Place 1 drop into both eyes daily.   Yes Historical Provider, MD  traMADol (ULTRAM) 50 MG tablet Take 50 mg by mouth every 8 (eight) hours as needed. For pain/neuropathy 01/04/12  Yes Jacquelynn Cree, PA   Physical Exam: Filed Vitals:   02/14/12 1825 02/14/12 2030 02/15/12 0059 02/15/12 0100  BP: 132/62 138/65 95/65   Pulse:  82 87   Temp:  99.3 F (37.4 C)    TempSrc:  Oral    Resp:  20 16   Height:    5' 4.57" (1.64 m)  Weight:    68 kg (149 lb 14.6 oz)  SpO2:  99% 99%     General:  NAD, resting comfortably in bed Eyes: PEERLA EOMI ENT: mucous membranes moist Neck: supple w/o JVD Cardiovascular: RRR w/o MRG Respiratory: CTA B Abdomen: soft, nt, nd, bs+ Skin: see musculoskeletal exam below Musculoskeletal: MAE, there is obvious mucopurulent yellow greenish colored drainage from the  site of the stump incision on her RLE, there is some sanguinous discharge as well. Psychiatric: normal tone and affect Neurologic: AAOx3, grossly non-focal  Labs on Admission:  Basic Metabolic Panel:  Lab 02/14/12 1610  NA 138  K 2.8*  CL 97  CO2 30  GLUCOSE 140*  BUN 4*  CREATININE 1.81*  CALCIUM 8.9  MG --  PHOS --   Liver Function Tests: No results found for this basename: AST:5,ALT:5,ALKPHOS:5,BILITOT:5,PROT:5,ALBUMIN:5 in the last 168 hours No results found for this basename: LIPASE:5,AMYLASE:5 in the last 168 hours No results found for this basename: AMMONIA:5 in the last 168 hours CBC:  Lab 02/14/12 1829  WBC 8.0  NEUTROABS 5.4  HGB 9.8*  HCT 32.0*  MCV 77.1*  PLT 340   Cardiac Enzymes: No results found for this basename: CKTOTAL:5,CKMB:5,CKMBINDEX:5,TROPONINI:5 in the last 168 hours  BNP (last 3 results) No results found for this basename: PROBNP:3 in the last 8760 hours CBG: No results found for this basename: GLUCAP:5 in the last 168 hours  Radiological Exams on Admission: Dg Femur Right  02/14/2012  *RADIOLOGY REPORT*  Clinical Data: Right leg amputation 2.5 months ago with revision 3 weeks ago, stone pain, diabetes, question continued infection  RIGHT FEMUR - 2 VIEW  Comparison: None  Findings: Severe osseous demineralization. Extensive atherosclerotic calcification. Visualized portion of the right pelvis intact. Distal right femoral above-knee amputation identified, with osseous margins fairly sharply defined. Ulcer at the lateral aspect of the stump is in close proximity to the femur without gross evidence of fracture or bone destruction. Soft tissue swelling of the stump.  IMPRESSION: Distal right femoral above-knee amputation. No gross evidence of osteomyelitis. Osseous demineralization.   Original Report Authenticated By: Ulyses Southward, M.D.     EKG: Independently reviewed.  Assessment/Plan Principal Problem:  *Amputation stump infection Active  Problems:  ESRD (end stage renal disease) on dialysis  Diabetes mellitus, type 2   1. Amputation stump infection - apparently this has significantly worsened in the past 24 hours since seeing Dr. Lajoyce Corners in his  office, will start zosyn and vanc pharm to dose as patient is ESRD.  Likely Dr. Lajoyce Corners or his partner to be consulted tomorrow, concerned that patient may require further surgical amputation / debridement, patient and husband understand this well as it was explained to them by Dr. Lajoyce Corners. 2. ESRD - just had dialysis but if stays for more than 1 day in hospital may need to consult nephrology regarding need for dialysis during stay. 3. DM2 - will keep on home insulin dosing for now with CBG checks.  No consults called  Code Status: Full Code (must indicate code status--if unknown or must be presumed, indicate so) Family Communication: Spoke with husband at bedside (indicate person spoken with, if applicable, with phone number if by telephone) Disposition Plan: admit to inpatient (indicate anticipated LOS)  Time spent: 70 min  Farooq Petrovich M. [facility] Pager [phone]  If 7PM-7AM, please contact night-coverage [email or web address] Password [email or web address] 02/15/2012, 1:26 AM

## 2012-02-15 NOTE — Progress Notes (Signed)
Pt running Vent bigeminy on telemetty. On assessment, pt asymptomatic, but did state that about 30 mins prior she did experience a short episode of chest pain lasting about 3 mins. The chest pain went away on its own and she did not experience any other symptoms along with the chest pain. Pt stable at this time. Dr Burtis Junes notified. New orders received. Will continue to monitor. [place], Rosanna Randy

## 2012-02-15 NOTE — Transfer of Care (Signed)
Immediate Anesthesia Transfer of Care Note  Patient: Faith [name]  Procedure(s) Performed: Procedure(s) (LRB) with comments: IRRIGATION AND DEBRIDEMENT EXTREMITY (Right)  Patient Location: PACU  Anesthesia Type:General  Level of Consciousness: awake and alert   Airway & Oxygen Therapy: Patient Spontanous Breathing and Patient connected to nasal cannula oxygen  Post-op Assessment: Report given to PACU RN and Post -op Vital signs reviewed and stable  Post vital signs: Reviewed and stable  Complications: No apparent anesthesia complications

## 2012-02-15 NOTE — Progress Notes (Signed)
Dr Durene Romans aware of pt's CBG of 62.  States this low glucose level has been normal for her, and she is asymptomatic.  No orders received

## 2012-02-15 NOTE — Progress Notes (Signed)
Patient ID: Faith [name], female   DOB: [birth year], 56 y.o.   MRN: [id number] Right AKA stump has opened up and has drainage concerning for infection.  I have spoken to her in length about this and she understands fully the need for an urgent irrigation/debridement and revision AKA with the plan of getting her AKA stump clean, shortened and closed.  This needs to be done today.  She understands the risks/benefits involved.

## 2012-02-15 NOTE — ED Notes (Signed)
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

## 2012-02-15 NOTE — Progress Notes (Signed)
Pt admitted to reg bed, comes from home with husband. Pt is AOX4, non ambulatory using wheelchair at home. Pt has R AKA with dehisced incision having bloody, yellowish drainage with odor. ABD pad and tape applied. Pt oriented to unit staff and safety. Bed alarm on, call bell light within reach. Will continue to monitor.

## 2012-02-15 NOTE — Progress Notes (Signed)
Subjective: Pt was admitted yesterday from the ED to hospitalist sevice and then transferred to IMTS today for right AKA stump infection. 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 Pt presented earlier to Dr. Audrie Lia office on 02/13/12 and sent home with silvadene cream and oral antibiotics until a revision could be performed. The pt has had no constitutional symptoms throughout this time and initially presented because her husband who changes her gauze noticed that the wound had opened and was producing green malodorous drainage. She has been afebrile and VSS since her admission and doesn't appear septic on exam. Bcx were not drawn before vanc/zosyn were started. Xray didn't show frank osteomyelitis, although non-specific.   She reports feeling well this AM but that there is pain in her right stump. Pt remained afebrile overnight and still doesn't meet SIRS criteria. Pt CBG this AM was 61 and pt given crackers and soda repeat was 81. Pt totally asymptomatic at this time. Pt had HD on S, T, and due for this Friday. Pt K 2.8 this AM and asymptomatic. Pt repleted.  Objective: Vital signs in last 24 hours: Filed Vitals:   02/15/12 0100 02/15/12 0234 02/15/12 0556 02/15/12 0824  BP:  119/68 139/69 155/85  Pulse:  85 87 92  Temp:  98.8 F (37.1 C) 98.8 F (37.1 C) 98.4 F (36.9 C)  TempSrc:  Oral Oral Oral  Resp:  18 18 18   Height: 5' 4.57" (1.64 m)     Weight: 149 lb 14.6 oz (68 kg) 153 lb 10.6 oz (69.7 kg)    SpO2:  96% 98% 98%   Weight change:  No intake or output data in the 24 hours ending 02/15/12 0941 General: resting in bed HEENT: PERRL, EOMI, no scleral icterus Cardiac: RRR, no rubs, murmurs or gallops Pulm: clear to auscultation bilaterally, moving normal volumes of air Abd: soft, nontender, nondistended, BS present Ext: warm and well perfused,right AV fistula good bruit and thrill not ttp/erythematous, R AKA swollen, warm, incision open and draining frank green  purulent fluid area of necrotized black skin on lateral area of incision, left leg in unna boot but no ulcerations and good pulses.  Neuro: alert and oriented X3, cranial nerves II-XII grossly intact  Lab Results: Basic Metabolic Panel:  Lab 02/15/12 1610 02/15/12 0126 2012-02-22 1829  NA 136 -- 138  K 2.8* -- 2.8*  CL 95* -- 97  CO2 30 -- 30  GLUCOSE 58* -- 140*  BUN 7 -- 4*  CREATININE 2.34* 2.19* --  CALCIUM 9.4 -- 8.9  MG -- -- --  PHOS -- -- --   CBC:  Lab 02/15/12 0535 02/15/12 0126 Feb 22, 2012 1829  WBC 7.7 7.8 --  NEUTROABS -- -- 5.4  HGB 10.0* 9.5* --  HCT 32.0* 31.7* --  MCV 76.9* 77.9* --  PLT 372 350 --   CBG:  Lab 02/15/12 0830 02/15/12 0802 02/15/12 0231  GLUCAP 73 61* 103*   Micro Results: Recent Results (from the past 240 hour(s))  MRSA PCR SCREENING     Status: Normal   Collection Time   02/15/12  2:48 AM      Component Value Range Status Comment   MRSA by PCR NEGATIVE  NEGATIVE Final    Studies/Results: Dg Femur Right  02/22/2012  *RADIOLOGY REPORT*  Clinical Data: Right leg amputation 2.5 months ago with revision 3 weeks ago, stone pain, diabetes, question continued infection  RIGHT FEMUR - 2 VIEW  Comparison: None  Findings:  Severe osseous demineralization. Extensive atherosclerotic calcification. Visualized portion of the right pelvis intact. Distal right femoral above-knee amputation identified, with osseous margins fairly sharply defined. Ulcer at the lateral aspect of the stump is in close proximity to the femur without gross evidence of fracture or bone destruction. Soft tissue swelling of the stump.  IMPRESSION: Distal right femoral above-knee amputation. No gross evidence of osteomyelitis. Osseous demineralization.   Original Report Authenticated By: Ulyses Southward, M.D.    Medications: I have reviewed the patient's current medications. Scheduled Meds:   . sodium chloride   Intravenous STAT  . aspirin EC  81 mg Oral Daily  . cinacalcet  30 mg Oral  Q breakfast  . cinacalcet  90 mg Oral QPM  . ezetimibe  10 mg Oral Daily  . fenofibrate  160 mg Oral Daily  . gabapentin  300 mg Oral QHS  . heparin  5,000 Units Subcutaneous Q8H  . insulin aspart  0-15 Units Subcutaneous TID WC  . multivitamin  1 tablet Oral QHS  . pantoprazole  40 mg Oral QHS  . piperacillin-tazobactam (ZOSYN)  IV  2.25 g Intravenous Q8H  . potassium chloride SA  40 mEq Oral Q4H  . senna-docusate  1 tablet Oral BID  . sevelamer  3,200 mg Oral TID WC  . vancomycin  500 mg Intravenous Once   Continuous Infusions:  PRN Meds:.acetaminophen, albuterol, guaiFENesin, HYDROmorphone (DILAUDID) injection, ondansetron (ZOFRAN) IV, polyvinyl alcohol Assessment/Plan: Ms. Begeman 56 yo AA woman complicated pmh most significant for ESRD HD dependent, DM, PAD, CABG 2002 p/w worsening AKA stump pain and drainage s/p revision surgery on 01/17/12 2/2 gangrene and osteomyelitis.   1. 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 Pt f/u in Dr. Audrie Lia office on 02/13/12 for concern of purulent drainage and given silvadene cream and oral Abx (unknown by pt and per chart review) until a revision could be done. Ortho was consulted and made aware that pt was in house and recommendations.  -consult ortho recs greatly appreciated -pt NPO at this time for possible surgery/revision -no further imaging recommended at this time -cont vanc/zosyn  2. Hypokalemia: Pt K 2.8 this AM. Asymptomatic.  -tele -replete K -recheck Bmet -EKG  3. ESRD: pt usual dialysis M, W, F at [address] but 2/2 holidays pt had dialysis Sun, T, scheduled Friday.  -renal panel in AM -consult renal   4. DM type 2: last known HgbA1c 10.6 11/12. Pt hypoglycemic 61 this AM and then after snack and recheck 73. Pt asymptomatic during this time.  -diet NPO until ortho evaluation -SSI -HgbA1c ordered  Dispo: Disposition is deferred at this time,  awaiting improvement of current medical problems.  Anticipated discharge in approximately 2-3 day(s).   The patient does have a current PCP (Thane Edu, MD), therefore will be requiring OPC follow-up after discharge.   The patient does not have transportation limitations that hinder transportation to clinic appointments.  .Services Needed at time of discharge: Y = Yes, Blank = No PT:   OT:   RN:   Equipment:   Other:     LOS: 1 day   Christen Bame 02/15/2012, 9:41 AM. [phone]

## 2012-02-15 NOTE — Preoperative (Signed)
Beta Blockers   Reason not to administer Beta Blockers:Not Applicable 

## 2012-02-16 DIAGNOSIS — Y849 Medical procedure, unspecified as the cause of abnormal reaction of the patient, or of later complication, without mention of misadventure at the time of the procedure: Secondary | ICD-10-CM

## 2012-02-16 LAB — GLUCOSE, CAPILLARY: Glucose-Capillary: 122 mg/dL — ABNORMAL HIGH (ref 70–99)

## 2012-02-16 LAB — RENAL FUNCTION PANEL
Albumin: 2.6 g/dL — ABNORMAL LOW (ref 3.5–5.2)
BUN: 12 mg/dL (ref 6–23)
CO2: 23 mEq/L (ref 19–32)
Chloride: 94 mEq/L — ABNORMAL LOW (ref 96–112)
Creatinine, Ser: 3.64 mg/dL — ABNORMAL HIGH (ref 0.50–1.10)
Glucose, Bld: 147 mg/dL — ABNORMAL HIGH (ref 70–99)
Potassium: 5 mEq/L (ref 3.5–5.1)

## 2012-02-16 LAB — HEMOGLOBIN A1C: Hgb A1c MFr Bld: 5.3 % (ref <5.7)

## 2012-02-16 MED ORDER — VANCOMYCIN HCL 500 MG IV SOLR
500.0000 mg | INTRAVENOUS | Status: DC
  Filled 2012-02-16 (×2): qty 500

## 2012-02-16 MED ORDER — ONDANSETRON 4 MG PO TBDP
4.0000 mg | ORAL_TABLET | Freq: Four times a day (QID) | ORAL | Status: DC
  Administered 2012-02-16 – 2012-02-17 (×3): 4 mg via ORAL
  Filled 2012-02-16 (×9): qty 1

## 2012-02-16 MED ORDER — PIPERACILLIN-TAZOBACTAM IN DEX 2-0.25 GM/50ML IV SOLN: 2.2500 g | Freq: Three times a day (TID) | INTRAVENOUS | Status: DC

## 2012-02-16 MED ORDER — POLYETHYLENE GLYCOL 3350 17 G PO PACK
17.0000 g | PACK | Freq: Every day | ORAL | Status: DC | PRN
  Administered 2012-02-16: 17 g via ORAL
  Filled 2012-02-16: qty 1

## 2012-02-16 MED ORDER — DIPHENHYDRAMINE HCL 25 MG PO CAPS
25.0000 mg | ORAL_CAPSULE | Freq: Once | ORAL | Status: AC
  Administered 2012-02-16: 25 mg via ORAL
  Filled 2012-02-16: qty 1

## 2012-02-16 MED ORDER — VANCOMYCIN HCL 500 MG IV SOLR: 500.0000 mg | Freq: Two times a day (BID) | INTRAVENOUS | Status: DC

## 2012-02-16 NOTE — Progress Notes (Signed)
ANTIBIOTIC CONSULT NOTE - FOLLOW UP  Pharmacy Consult for vancomycin, zosyn Indication: stump infection  Allergies  Allergen Reactions  . Lipitor (Atorvastatin Calcium) Other (See Comments)    Muscle weakness  . Codeine Nausea And Vomiting  . Metoclopramide Hcl Other (See Comments)    Tardive dyskinesia    Patient Measurements: Height: 5' 4.57" (164 cm) Weight: 158 lb 11.7 oz (72 kg) IBW/kg (Calculated) : 56    Vital Signs: Temp: 98.4 F (36.9 C) (12/26 0436) Temp src: Oral (12/26 0436) BP: 137/73 mmHg (12/26 0436) Pulse Rate: 96  (12/26 0436) Intake/Output from previous day: 12/25 0701 - 12/26 0700 In: 695 [P.O.:300; I.V.:395] Out: 125 [Blood:125] Intake/Output from this shift: Total I/O In: 240 [P.O.:240] Out: -   Labs:  Basename 02/16/12 0735 02/15/12 1320 02/15/12 0535 02/15/12 0126 02/14/12 1829  WBC -- -- 7.7 7.8 8.0  HGB -- -- 10.0* 9.5* 9.8*  PLT -- -- 372 350 340  LABCREA -- -- -- -- --  CREATININE 3.64* 2.74* 2.34* -- --   Estimated Creatinine Clearance: 16.8 ml/min (by C-G formula based on Cr of 3.64). No results found for this basename: VANCOTROUGH:2,VANCOPEAK:2,VANCORANDOM:2,GENTTROUGH:2,GENTPEAK:2,GENTRANDOM:2,TOBRATROUGH:2,TOBRAPEAK:2,TOBRARND:2,AMIKACINPEAK:2,AMIKACINTROU:2,AMIKACIN:2, in the last 72 hours   Microbiology: Recent Results (from the past 720 hour(s))  MRSA PCR SCREENING     Status: Normal   Collection Time   02/15/12  2:48 AM      Component Value Range Status Comment   MRSA by PCR NEGATIVE  NEGATIVE Final     Anti-infectives     Start     Dose/Rate Route Frequency Ordered Stop   02/16/12 1200   vancomycin (VANCOCIN) 500 mg in sodium chloride 0.9 % 100 mL IVPB        500 mg 50 mL/hr over 120 Minutes Intravenous NOW 02/16/12 1101 02/17/12 1200   02/16/12 0200  piperacillin-tazobactam (ZOSYN) IVPB 2.25 g       2.25 g 100 mL/hr over 30 Minutes Intravenous 3 times per day 02/15/12 1823     02/15/12 0600    piperacillin-tazobactam (ZOSYN) IVPB 2.25 g  Status:  Discontinued        2.25 g 100 mL/hr over 30 Minutes Intravenous 3 times per day 02/15/12 0238 02/15/12 1823   02/15/12 0330   vancomycin (VANCOCIN) 500 mg in sodium chloride 0.9 % 100 mL IVPB        500 mg 100 mL/hr over 60 Minutes Intravenous  Once 02/15/12 0238     02/14/12 2315   vancomycin (VANCOCIN) IVPB 1000 mg/200 mL premix        1,000 mg 200 mL/hr over 60 Minutes Intravenous  Once 02/14/12 2307 02/15/12 0130   02/14/12 2315  piperacillin-tazobactam (ZOSYN) IVPB 3.375 g       3.375 g 100 mL/hr over 30 Minutes Intravenous  Once 02/14/12 2307 02/15/12 0030          Assessment: Patient is a 56 y.o F with ESRD on vancomycin and zosyn for stump infection.  S/p revision of AKA and I&D on 12/25.  Patient received 1gm of vancomycin in the ED on 12/25.  Floor RN did not give the additional 500mg  vancomycin dose (to get 1500mg  totl) because thought dose was duplicate order since the other 1gm dose was given in the ED. 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 He received benadryl and infusion was slowed to alleviate side effects. Renal has not been consulted yet.  Goal of Therapy:  Pre-HD vancomycin level 15-25  Plan:  1) cont current zosyn regimen 2) vancomycin 500mg  IV x1 now (to get a total of 1500mg  loading dose) 3) benadryl 25mg  PO x1 prior to vancomycin dose and slow vancomycin infusion down to 2 hrs to avoid Redman's syndrome   Lasean Rahming P 02/16/2012,11:02 AM

## 2012-02-16 NOTE — Discharge Summary (Signed)
Internal Medicine Teaching [facility] Discharge Note  Name: Faith [name] MRN: [id number] DOB: 02-23-1955 56 y.o.  Date of Admission: 02/14/2012  7:40 PM Date of Discharge: 02/16/2012 Attending Physician: Rocco Serene, MD  Discharge Diagnosis: Principal Problem:  *Amputation stump infection Active Problems:  ESRD (end stage renal disease) on dialysis  Diabetes mellitus, type 2   Discharge Medications:   Medication List     As of 02/16/2012 11:24 AM    ASK your doctor about these medications         albuterol 108 (90 BASE) MCG/ACT inhaler   Commonly known as: PROVENTIL HFA;VENTOLIN HFA   Inhale 2 puffs into the lungs every 4 (four) hours as needed. For shortness of breath      aspirin EC 81 MG tablet   Take 81 mg by mouth daily.      cinacalcet 30 MG tablet   Commonly known as: SENSIPAR   Take 30 mg by mouth daily with breakfast.      cinacalcet 90 MG tablet   Commonly known as: SENSIPAR   Take 90 mg by mouth every evening.      doxycycline 100 MG tablet   Commonly known as: VIBRA-TABS   Take 100 mg by mouth 2 (two) times daily.      ezetimibe 10 MG tablet   Commonly known as: ZETIA   Take 10 mg by mouth daily.      fenofibrate micronized 134 MG capsule   Commonly known as: LOFIBRA   Take 134 mg by mouth daily.      gabapentin 300 MG capsule   Commonly known as: NEURONTIN   Take 1 capsule (300 mg total) by mouth at bedtime. Note decreased dose.      guaiFENesin 100 MG/5ML Soln   Commonly known as: ROBITUSSIN   Take 15 mLs by mouth every 6 (six) hours as needed. For coughing/wheezing      HYDROcodone-acetaminophen 5-500 MG per tablet   Commonly known as: VICODIN   Take 1 tablet by mouth every 6 (six) hours as needed. For pain      insulin aspart 100 UNIT/ML injection   Commonly known as: novoLOG   Inject 4-12 Units into the skin 3 (three) times daily before meals. Per sliding scale      methocarbamol 500 MG tablet   Commonly known as:  ROBAXIN   Take 500 mg by mouth every 6 (six) hours as needed. For muscle spasms      multivitamin Tabs tablet   Take 1 tablet by mouth daily.      nitroGLYCERIN 0.4 MG SL tablet   Commonly known as: NITROSTAT   Place 0.4 mg under the tongue every 5 (five) minutes as needed. For chest pain      pantoprazole 40 MG tablet   Commonly known as: PROTONIX   Take 40 mg by mouth at bedtime.      polyvinyl alcohol 1.4 % ophthalmic solution   Commonly known as: LIQUIFILM TEARS   Place 1 drop into both eyes daily as needed. For dryness      senna-docusate 8.6-50 MG per tablet   Commonly known as: Senokot-S   Take 1 tablet by mouth 2 (two) times daily.      sevelamer 800 MG tablet   Commonly known as: RENAGEL   Take 2-4 tablets (1,600-3,200 mg total) by mouth See admin instructions. Take 4 tablets with meals and 2 tablets with snacks      silver sulfADIAZINE 1 %  cream   Commonly known as: SILVADENE   Apply 1 application topically 2 (two) times daily. To incision site      tetrahydrozoline 0.05 % ophthalmic solution   Place 1 drop into both eyes daily.      traMADol 50 MG tablet   Commonly known as: ULTRAM   Take 50 mg by mouth every 8 (eight) hours as needed. For pain/neuropathy      TYLENOL ARTHRITIS PAIN 650 MG CR tablet   Generic drug: acetaminophen   Take 650 mg by mouth every 8 (eight) hours as needed. For pain         Disposition and follow-up:   [name] was discharged from [facility] in stable condition.  At the hospital follow up visit please address pain control and IV antibiotics.   Follow-up Appointments: Follow-up Information    Follow up with Kathryne Hitch, MD. In 2 weeks.   Contact information:   [address] [place] [address] [phone]           Consultations:  Ortho, nephrology  Procedures Performed:  Dg Femur Right  02/14/2012  *RADIOLOGY REPORT*  Clinical Data: Right leg amputation 2.5 months  ago with revision 3 weeks ago, stone pain, diabetes, question continued infection  RIGHT FEMUR - 2 VIEW  Comparison: None  Findings: Severe osseous demineralization. Extensive atherosclerotic calcification. Visualized portion of the right pelvis intact. Distal right femoral above-knee amputation identified, with osseous margins fairly sharply defined. Ulcer at the lateral aspect of the stump is in close proximity to the femur without gross evidence of fracture or bone destruction. Soft tissue swelling of the stump.  IMPRESSION: Distal right femoral above-knee amputation. No gross evidence of osteomyelitis. Osseous demineralization.   Original Report Authenticated By: Ulyses Southward, M.D.    Admission HPI: Faith [name] is a 56 y.o. female who recently had an amputation of her RLE stump here in the hospital performed by Dr. Lajoyce Corners. Unfortunately over the past few days they have noticed increased purulent drainage from the incision on the stump site. Patient went to Dr. Audrie Lia office and they started a PO antibiotic yesterday. Unfortunately things "took a turn for the worse" rapidly, and the wound began to open up further with increasing purulent drainage and bleeding. As a result they presented to the ED today.  In the ED X Ray didn't show any obvious evidence of osteomyelitis, and no signs or symptoms of SIRS on eval, but the wound is clearly infected and draining. Hospitalist has been asked to admit for IV abx, zosyn and vanc started in ED.  Hospital Course by problem list: 1. 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 Pt f/u in Dr. Audrie Lia office on 02/13/12 for concern of purulent drainage and given silvadene cream and oral Abx (unknown by pt and per chart review) until a revision could be done. Ortho was consulted and made aware that pt was in house and recommendations were to perform another revision of AKA.  Irrigation and  debridement of right AKA including debridement of skin, soft tissue, muscle and bone were performed along with stump revision. Pt was continued on IV vanc/zosyn which if true osteomyelitis would need to continue for 6 wks. Pt would also benefit from Percocet.  2. Hypokalemia: Pt originally K 2.8 and was asymptomatic. Pt then had some chest discomfort and bigeminy with mulitple PVCs on tele. Pt was repleted with K and evaluated  found to have self resolution of CP, PVCs and U waves on 12 lead EKG and low K of 2.8, and neg troponins. Pt was repleted and K normalized upon d/c 3.6. Pt had no repeat runs of bigeminy or CP.  3. ESRD: pt usual dialysis M, W, F at [address] but 2/2 holidays pt had dialysis Sun, T, scheduled Friday. Pt didn't have PIV access and didn't receive dialysis while her admission.   4. DM type 2: last known HgbA1c 10.6 11/12 and repeat during admission was 5.3 this is probably falsely low in light of her CKD.   Discharge Vitals:  BP 155/81  Pulse 98  Temp 98.4 F (36.9 C) (Oral)  Resp 14  Ht 5' 4.57" (1.64 m)  Wt 158 lb 11.7 oz (72 kg)  BMI 26.77 kg/m2  SpO2 100% General: resting in bed  HEENT: PERRL, EOMI, no scleral icterus  Cardiac: RRR, no rubs, murmurs or gallops  Pulm: clear to auscultation bilaterally, moving normal volumes of air  Abd: soft, nontender, nondistended, BS present  Ext: warm and well perfused,right AV fistula good bruit and thrill not ttp/erythematous, R AKA in stocking c/d/i, left leg in unna boot but no ulcerations and good pulses.  Neuro: alert and oriented X3, cranial nerves II-XII grossly intact  Discharge Labs:  Results for orders placed during the hospital encounter of 02/14/12 (from the past 24 hour(s))  TROPONIN I     Status: Normal   Collection Time   02/15/12 11:41 AM      Component Value Range   Troponin I <0.30  <0.30 ng/mL  GLUCOSE, CAPILLARY     Status: Normal   Collection Time   02/15/12 12:02 PM      Component Value Range    Glucose-Capillary 82  70 - 99 mg/dL  BASIC METABOLIC PANEL     Status: Abnormal   Collection Time   02/15/12  1:20 PM      Component Value Range   Sodium 134 (*) 135 - 145 mEq/L   Potassium 3.6  3.5 - 5.1 mEq/L   Chloride 93 (*) 96 - 112 mEq/L   CO2 29  19 - 32 mEq/L   Glucose, Bld 69 (*) 70 - 99 mg/dL   BUN 8  6 - 23 mg/dL   Creatinine, Ser 1.61 (*) 0.50 - 1.10 mg/dL   Calcium 9.6  8.4 - 09.6 mg/dL   GFR calc non Af Amer 18 (*) >90 mL/min   GFR calc Af Amer 21 (*) >90 mL/min  HEMOGLOBIN A1C     Status: Normal   Collection Time   02/15/12  1:20 PM      Component Value Range   Hemoglobin A1C 5.3  <5.7 %   Mean Plasma Glucose 105  <117 mg/dL  GLUCOSE, CAPILLARY     Status: Abnormal   Collection Time   02/15/12  3:40 PM      Component Value Range   Glucose-Capillary 62 (*) 70 - 99 mg/dL  GLUCOSE, CAPILLARY     Status: Normal   Collection Time   02/15/12  4:17 PM      Component Value Range   Glucose-Capillary 70  70 - 99 mg/dL  GLUCOSE, CAPILLARY     Status: Abnormal   Collection Time   02/15/12  4:33 PM      Component Value Range   Glucose-Capillary 69 (*) 70 - 99 mg/dL  GLUCOSE, CAPILLARY     Status: Abnormal   Collection Time  02/15/12  5:39 PM      Component Value Range   Glucose-Capillary 105 (*) 70 - 99 mg/dL   Comment 1 Notify RN     Comment 2 Documented in Chart    GLUCOSE, CAPILLARY     Status: Normal   Collection Time   02/15/12  8:35 PM      Component Value Range   Glucose-Capillary 94  70 - 99 mg/dL  RENAL FUNCTION PANEL     Status: Abnormal   Collection Time   02/16/12  7:35 AM      Component Value Range   Sodium 136  135 - 145 mEq/L   Potassium 5.0  3.5 - 5.1 mEq/L   Chloride 94 (*) 96 - 112 mEq/L   CO2 23  19 - 32 mEq/L   Glucose, Bld 147 (*) 70 - 99 mg/dL   BUN 12  6 - 23 mg/dL   Creatinine, Ser 6.21 (*) 0.50 - 1.10 mg/dL   Calcium 9.7  8.4 - 30.8 mg/dL   Phosphorus 3.2  2.3 - 4.6 mg/dL   Albumin 2.6 (*) 3.5 - 5.2 g/dL   GFR calc non Af Amer  13 (*) >90 mL/min   GFR calc Af Amer 15 (*) >90 mL/min  GLUCOSE, CAPILLARY     Status: Abnormal   Collection Time   02/16/12  8:13 AM      Component Value Range   Glucose-Capillary 145 (*) 70 - 99 mg/dL   Comment 1 Notify RN     Comment 2 Documented in Chart      Signed: Christen Bame 02/16/2012, 11:24 AM   Time Spent on Discharge: 31 min Services Ordered on Discharge: none Equipment Ordered on Discharge: none

## 2012-02-16 NOTE — Progress Notes (Signed)
Late Entry: Patient does not have peripheral IV access. Dr. Burtis Junes made aware. IV antibiotic treatment is to be coordinated and initiated with patient's Hemodialysis treatment starting tomorrow (02/17/12), per Dr. Burtis Junes.

## 2012-02-16 NOTE — Progress Notes (Signed)
Internal Medicine Teaching Service Attending Note Date: 02/16/2012  Patient name: Faith [name]  Medical record number: [id number]  Date of birth: Jun 26, 1955    This patient has been seen and discussed with the house staff. Please see their note for complete details. I concur with their findings with the following additions/corrections:  1. S/p R BKA revision - surgery has cleared pt to be D/C'd. She still c/o pain and is on oxycodone. She lost IV access so has not been getting Dilaudid. She has not hade much of an appetite and didn't eat breakfast.   2. Mobility limitations - after the BKA, she was going to [facility] (from her nl of two times a week) since her husband works. Her husband stated that [facility] had said they were looking into a couple days of SNF before returning home. Our team will investigate this as we were not aware of this as a dispo option. She has a CNA that comes Mon - Friday. She has not been OOB yet and had just started to learn who to transfer when was admitted. Will need PT / OT but that can be done at SNF / [facility] / outpt.   3. ESRD - to cont as an outpt.   Dispo - possible today but need to finalize the plans.   BUTCHER,ELIZABETH 02/16/2012, 12:57 PM

## 2012-02-16 NOTE — Progress Notes (Addendum)
Subjective: Pt is POD #1 AKA revision doing well this AM. VSS. Pt having some nausea.   Objective: Vital signs in last 24 hours: Filed Vitals:   02/15/12 1630 02/15/12 1645 02/15/12 2036 02/16/12 0436  BP:  124/80 144/76 137/73  Pulse:  85 82 96  Temp: 97.4 F (36.3 C) 98.2 F (36.8 C) 98.1 F (36.7 C) 98.4 F (36.9 C)  TempSrc:  Oral Oral Oral  Resp: 14  14 16   Height:      Weight:   158 lb 11.7 oz (72 kg)   SpO2: 100% 100% 95% 100%   Weight change: 8 lb 13.1 oz (4 kg)  Intake/Output Summary (Last 24 hours) at 02/16/12 1016 Last data filed at 02/16/12 1006  Gross per 24 hour  Intake    935 ml  Output    125 ml  Net    810 ml   General: resting in bed HEENT: PERRL, EOMI, no scleral icterus Cardiac: RRR, no rubs, murmurs or gallops Pulm: clear to auscultation bilaterally, moving normal volumes of air Abd: soft, nontender, nondistended, BS present Ext: warm and well perfused,right AV fistula good bruit and thrill not ttp/erythematous, R AKA in stocking c/d/i, left leg in unna boot but no ulcerations and good pulses.  Neuro: alert and oriented X3, cranial nerves II-XII grossly intact  Lab Results: Basic Metabolic Panel:  Lab 02/16/12 1610 02/15/12 1320  NA 136 134*  K 5.0 3.6  CL 94* 93*  CO2 23 29  GLUCOSE 147* 69*  BUN 12 8  CREATININE 3.64* 2.74*  CALCIUM 9.7 9.6  MG -- --  PHOS 3.2 --   CBC:  Lab 02/15/12 0535 02/15/12 0126 03-07-12 1829  WBC 7.7 7.8 --  NEUTROABS -- -- 5.4  HGB 10.0* 9.5* --  HCT 32.0* 31.7* --  MCV 76.9* 77.9* --  PLT 372 350 --   CBG:  Lab 02/16/12 0813 02/15/12 2035 02/15/12 1739 02/15/12 1633 02/15/12 1617 02/15/12 1540  GLUCAP 145* 94 105* 69* 70 62*   Micro Results: Recent Results (from the past 240 hour(s))  MRSA PCR SCREENING     Status: Normal   Collection Time   02/15/12  2:48 AM      Component Value Range Status Comment   MRSA by PCR NEGATIVE  NEGATIVE Final    Studies/Results: Dg Femur Right  03-07-2012   *RADIOLOGY REPORT*  Clinical Data: Right leg amputation 2.5 months ago with revision 3 weeks ago, stone pain, diabetes, question continued infection  RIGHT FEMUR - 2 VIEW  Comparison: None  Findings: Severe osseous demineralization. Extensive atherosclerotic calcification. Visualized portion of the right pelvis intact. Distal right femoral above-knee amputation identified, with osseous margins fairly sharply defined. Ulcer at the lateral aspect of the stump is in close proximity to the femur without gross evidence of fracture or bone destruction. Soft tissue swelling of the stump.  IMPRESSION: Distal right femoral above-knee amputation. No gross evidence of osteomyelitis. Osseous demineralization.   Original Report Authenticated By: Ulyses Southward, M.D.    Medications: I have reviewed the patient's current medications. Scheduled Meds:    . aspirin EC  81 mg Oral Daily  . cinacalcet  30 mg Oral Q breakfast  . cinacalcet  90 mg Oral QPM  . ezetimibe  10 mg Oral Daily  . fenofibrate  160 mg Oral Daily  . gabapentin  300 mg Oral QHS  . heparin  5,000 Units Subcutaneous Q8H  . insulin aspart  0-15 Units Subcutaneous TID WC  .  multivitamin  1 tablet Oral QHS  . ondansetron  4 mg Oral Q6H  . pantoprazole  40 mg Oral QHS  . piperacillin-tazobactam (ZOSYN)  IV  2.25 g Intravenous Q8H  . senna-docusate  1 tablet Oral BID  . sevelamer  3,200 mg Oral TID WC  . vancomycin  500 mg Intravenous Once   Continuous Infusions:  PRN Meds:.acetaminophen, albuterol, guaiFENesin, HYDROmorphone (DILAUDID) injection, oxyCODONE-acetaminophen, polyethylene glycol, polyvinyl alcohol, promethazine Assessment/Plan: Faith [name] 56 yo AA woman complicated pmh most significant for ESRD HD dependent, DM, PAD, CABG 2002 p/w worsening AKA stump pain and drainage s/p revision surgery on 01/17/12 2/2 gangrene and osteomyelitis.   1. 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 Pt f/u in Dr. Audrie Lia office on 02/13/12 for concern of purulent drainage and given silvadene cream and oral Abx (unknown by pt and per chart review) until a revision could be done. Ortho was consulted and made aware that pt was in house and recommendations.  -consult ortho recs greatly appreciated -pt NPO at this time for possible surgery/revision -no further imaging recommended at this time -cont vanc/zosyn  >>>did speak to Dr. Dorothe Pea directly and updated on pt's status and he felt comfortable resuming care of patient today and that pt could be d/c home. >>>  2. ESRD: pt usual dialysis M, W, F at [address] but 2/2 holidays pt had dialysis Sun, T, scheduled Friday.  -renal panel in AM -consult renal   3. DM type 2: last known HgbA1c 10.6 11/12. Pt hypoglycemic 61 this AM and then after snack and recheck 73. Pt asymptomatic during this time.  -diet NPO until ortho evaluation -SSI -HgbA1c ordered  Dispo: Disposition is deferred at this time, awaiting improvement of current medical problems.  Anticipated discharge in approximately 2-3 day(s).   The patient does have a current PCP (Thane Edu, MD), therefore will be requiring OPC follow-up after discharge.   The patient does not have transportation limitations that hinder transportation to clinic appointments.  .Services Needed at time of discharge: Y = Yes, Blank = No PT:   OT:   RN:   Equipment:   Other:     LOS: 2 days   Faith [name] 02/16/2012, 10:16 AM. [phone]

## 2012-02-16 NOTE — Progress Notes (Signed)
Patient ID: Faith [name], female   DOB: 1955-03-16, 56 y.o.   MRN: [id number] Looks good this am.  No acute changes overnight.  The right AKA stump dressing this am is clean/dry/intact.  From ortho standpoint she can go home today.  She will follow-up in our office in 1-2 weeks and will leave her dressing on and clean until her follow-up.

## 2012-02-16 NOTE — Progress Notes (Signed)
   CARE MANAGEMENT NOTE 02/16/2012  Patient:  Faith [name], Faith [name]   Account Number:  [id number]  Date Initiated:  02/16/2012  Documentation initiated by:  Darlyne Russian  Subjective/Objective Assessment:   Patient admitted with purulent drainage for R AKA     Action/Plan:   Progression of care and discharge planning   Anticipated DC Date:  02/16/2012   Anticipated DC Plan:  HOME/SELF CARE         Choice offered to / List presented to:             Status of service:  Completed, signed off Medicare Important Message given?   (If response is "NO", the following Medicare IM given date fields will be blank) Date Medicare IM given:   Date Additional Medicare IM given:    Discharge Disposition:    Per UR Regulation:  Reviewed for med. necessity/level of care/duration of stay  If discussed at Long Length of Stay Meetings, dates discussed:    Comments:  04/19/2011  [address] RN, [place]  [phone]  Met with patient regarding discharge planning. [facility] is [facility] with [facility] and receives services with them.  [facility]: called  Neita Carp SW  [phone]   left vm to advise of discharge today spoke with Jerene Bears CM to advise of discharge today. She is aware of the admission and will follow up with patient when she is home. FAX: [phone]  faxed DC summary

## 2012-02-16 NOTE — Progress Notes (Signed)
I called and spoke to Dr. Dorothe Pea with [facility] and discussed discharge planning for Mrs. Kirchman.  He states that after her AKA on 11/26, she was discharged home and has been coming to [facility] program for PT and she also goes to dialysis 3 times per week and he does not understand why she would need SNF at this time?  I tried to call her husband but could not get in touch with him.  I also left a message for Misty Stanley who is the home care coordinator of [facility] who knows the patient and her family well.

## 2012-02-17 LAB — CBC
HCT: 28.6 % — ABNORMAL LOW (ref 36.0–46.0)
MCH: 23.7 pg — ABNORMAL LOW (ref 26.0–34.0)
MCHC: 31.1 g/dL (ref 30.0–36.0)
MCV: 76.3 fL — ABNORMAL LOW (ref 78.0–100.0)
RDW: 19.2 % — ABNORMAL HIGH (ref 11.5–15.5)
WBC: 13.4 10*3/uL — ABNORMAL HIGH (ref 4.0–10.5)

## 2012-02-17 LAB — RENAL FUNCTION PANEL
Albumin: 2.3 g/dL — ABNORMAL LOW (ref 3.5–5.2)
BUN: 21 mg/dL (ref 6–23)
Calcium: 9.5 mg/dL (ref 8.4–10.5)
Creatinine, Ser: 4.76 mg/dL — ABNORMAL HIGH (ref 0.50–1.10)
Glucose, Bld: 121 mg/dL — ABNORMAL HIGH (ref 70–99)
Phosphorus: 2.9 mg/dL (ref 2.3–4.6)

## 2012-02-17 LAB — GLUCOSE, CAPILLARY: Glucose-Capillary: 131 mg/dL — ABNORMAL HIGH (ref 70–99)

## 2012-02-17 MED ORDER — HYDROMORPHONE HCL PF 1 MG/ML IJ SOLN
0.5000 mg | Freq: Once | INTRAMUSCULAR | Status: AC
  Administered 2012-02-17: 0.5 mg via INTRAVENOUS

## 2012-02-17 MED ORDER — DARBEPOETIN ALFA-POLYSORBATE 100 MCG/0.5ML IJ SOLN
100.0000 ug | INTRAMUSCULAR | Status: DC
  Administered 2012-02-17: 100 ug via INTRAVENOUS
  Filled 2012-02-17: qty 0.5

## 2012-02-17 MED ORDER — HYDROMORPHONE HCL PF 1 MG/ML IJ SOLN
INTRAMUSCULAR | Status: AC
  Administered 2012-02-17: 0.5 mg via INTRAVENOUS
  Filled 2012-02-17: qty 1

## 2012-02-17 MED ORDER — OXYCODONE-ACETAMINOPHEN 5-325 MG PO TABS
ORAL_TABLET | ORAL | Status: AC
  Administered 2012-02-17: 11:00:00
  Filled 2012-02-17: qty 2

## 2012-02-17 MED ORDER — LIDOCAINE-PRILOCAINE 2.5-2.5 % EX CREA: 1.0000 "application " | TOPICAL_CREAM | CUTANEOUS | Status: DC | PRN

## 2012-02-17 MED ORDER — HEPARIN SODIUM (PORCINE) 1000 UNIT/ML DIALYSIS: 1000.0000 [IU] | INTRAMUSCULAR | Status: DC | PRN

## 2012-02-17 MED ORDER — SODIUM CHLORIDE 0.9 % IV SOLN: 100.0000 mL | INTRAVENOUS | Status: DC | PRN

## 2012-02-17 MED ORDER — PENTAFLUOROPROP-TETRAFLUOROETH EX AERO: 1.0000 "application " | INHALATION_SPRAY | CUTANEOUS | Status: DC | PRN

## 2012-02-17 MED ORDER — ALTEPLASE 2 MG IJ SOLR
2.0000 mg | Freq: Once | INTRAMUSCULAR | Status: DC | PRN
  Filled 2012-02-17: qty 2

## 2012-02-17 MED ORDER — NEPRO/CARBSTEADY PO LIQD: 237.0000 mL | ORAL | Status: DC | PRN

## 2012-02-17 MED ORDER — LIDOCAINE HCL (PF) 1 % IJ SOLN: 5.0000 mL | INTRAMUSCULAR | Status: DC | PRN

## 2012-02-17 MED ORDER — DARBEPOETIN ALFA-POLYSORBATE 100 MCG/0.5ML IJ SOLN: 100.0000 ug | INTRAMUSCULAR | Status: DC

## 2012-02-17 MED ORDER — VANCOMYCIN HCL IN DEXTROSE 1-5 GM/200ML-% IV SOLN
1000.0000 mg | INTRAVENOUS | Status: AC
  Administered 2012-02-17: 1000 mg via INTRAVENOUS
  Filled 2012-02-17: qty 200

## 2012-02-17 MED ORDER — DARBEPOETIN ALFA-POLYSORBATE 100 MCG/0.5ML IJ SOLN
INTRAMUSCULAR | Status: AC
  Administered 2012-02-17: 100 ug via INTRAVENOUS
  Filled 2012-02-17: qty 0.5

## 2012-02-17 MED ORDER — DIPHENHYDRAMINE HCL 25 MG PO CAPS
ORAL_CAPSULE | ORAL | Status: AC
  Filled 2012-02-17: qty 1

## 2012-02-17 MED ORDER — DIPHENHYDRAMINE HCL 25 MG PO CAPS
25.0000 mg | ORAL_CAPSULE | ORAL | Status: AC
  Administered 2012-02-17: 25 mg via ORAL

## 2012-02-17 NOTE — Consult Note (Signed)
[name] Renal Consultation Note  Indication for Consultation:  Management of ESRD/hemodialysis; anemia, hypertension/volume and secondary hyperparathyroidism  HPI: Faith [name] is a 56 y.o. 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 Aldean Baker and returned to the ER on 12/24 with purulent and bloody drainage from the stump, which required additional revision with irrigation and debridement, including debridement of skin, soft tissue, muscle, and bone by Dr. Doneen Poisson on 12/25.  She currently has pain in her right stump and is awaiting placement in a skilled nursing facility for rehabilitation, but will require dialysis today.  Dialysis Orders: Center: [facility] on MWF. EDW 68 kg  HD Bath 2K/2Ca  Time 3.5 hrs  Heparin 2000 U. Access AVG @ RUA BFR 500 DFR 800   Zemplar 0 mcg IV/HD Epogen 1600 Units IV/HD  Venofer 0.  Past Medical History  Diagnosis Date  . Hyperparathyroidism, secondary renal   . CAD (coronary artery disease) of artery bypass graft     CABG in 2002  . PAD (peripheral artery disease)     Not a candidate for revascularization  . Chronic constipation   . GERD (gastroesophageal reflux disease)   . Anemia of chronic renal failure   . Tardive dyskinesia     Secondary to Reglan  . Depression   . Neuropathy   . Diabetic retinopathy(362.0)   . Diastolic CHF, chronic     Grade 2  . Pulmonary embolism 06/2003  . Hyperlipidemia   . Stroke 2002    "w/left side" residual weakness  . Diabetes mellitus     type II  . CHF (congestive heart failure)   . CVA (cerebral infarction)     right brain with minimal left hemiparesis  . Pneumonia 10/2009; 11/2009    ?pneumonia 01/17/11  . Hypertension   . Arthritis   . Complication of anesthesia     "sleepy afterwards; due to her being dialysis pt"  . Myocardial infarction 2002  . ESRD on dialysis     [name]; M, W, F; via  right upper arm AVF (01/18/2012)   Past Surgical History  Procedure Date  . Coronary artery bypass graft 2002  . Laparoscopic cholecystectomy 2002  . Av fistula placement     times 4-using rt upper arm graft-old rt lower arm,lt upper and lower old grafts  . Trigger finger release   . Tubal ligation   . Eye examination under anesthesia w/ retinal cryotherapy and retinal laser   . Cataract extraction 07/2010    right sided   . Toe amputation 5/12    left 2nd toe secondary to osteomyelitis  . Breast biopsy 06/2010  . Carpal tunnel release 05/05/2011    Procedure: CARPAL TUNNEL RELEASE;  Surgeon: Sharma Covert, MD;  Location: [facility];  Service: Orthopedics;  Laterality: Right;  . Coronary angioplasty with stent placement   . Amputation 11/24/2011    Procedure: AMPUTATION RAY;  Surgeon: Nadara Mustard, MD;  Location: [facility] OR;  Service: Orthopedics;  Laterality: Right;  Right Foot 2nd Ray Amputation  . Amputation 12/12/2011    Procedure: AMPUTATION BELOW KNEE;  Surgeon: Nadara Mustard, MD;  Location: [facility] OR;  Service: Orthopedics;  Laterality: Right;  . 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 Amputation 01/17/2012    Procedure: AMPUTATION ABOVE KNEE;  Surgeon: Nadara Mustard, MD;  Location: [facility]  OR;  Service: Orthopedics;  Laterality: Right;  Right Above Knee Amputation   Family History  Problem Relation Age of Onset  . Diabetes Mother   . Cirrhosis Mother   . Diabetes Father   . Heart attack Father   . Hyperlipidemia Father   . Heart disease Father   . Diabetes Sister   . Hyperlipidemia Sister   . Hypertension Sister   . Diabetes Sister   . Diabetes Sister   . Heart disease Brother   . Hyperlipidemia Brother    Social History  She reports that she has never smoked. She has never used smokeless tobacco. She reports that she drinks alcohol. She reports that she uses illicit drugs (Marijuana).  Allergies  Allergen Reactions  . Lipitor (Atorvastatin Calcium)  Other (See Comments)    Muscle weakness  . Codeine Nausea And Vomiting  . Metoclopramide Hcl Other (See Comments)    Tardive dyskinesia   Prior to Admission medications   Medication Sig Start Date End Date Taking? Authorizing Provider  acetaminophen (TYLENOL ARTHRITIS PAIN) 650 MG CR tablet Take 650 mg by mouth every 8 (eight) hours as needed. For pain   Yes Historical Provider, MD  albuterol (PROVENTIL HFA;VENTOLIN HFA) 108 (90 BASE) MCG/ACT inhaler Inhale 2 puffs into the lungs every 4 (four) hours as needed. For shortness of breath   Yes Historical Provider, MD  aspirin EC 81 MG tablet Take 81 mg by mouth daily.   Yes Historical Provider, MD  cinacalcet (SENSIPAR) 30 MG tablet Take 30 mg by mouth daily with breakfast.   Yes Historical Provider, MD  cinacalcet (SENSIPAR) 90 MG tablet Take 90 mg by mouth every evening.   Yes Historical Provider, MD  ezetimibe (ZETIA) 10 MG tablet Take 10 mg by mouth daily.   Yes Historical Provider, MD  fenofibrate micronized (LOFIBRA) 134 MG capsule Take 134 mg by mouth daily.    Yes Historical Provider, MD  gabapentin (NEURONTIN) 300 MG capsule Take 1 capsule (300 mg total) by mouth at bedtime. Note decreased dose. 01/04/12  Yes Evlyn Kanner Love, PA  guaiFENesin (ROBITUSSIN) 100 MG/5ML SOLN Take 15 mLs by mouth every 6 (six) hours as needed. For coughing/wheezing   Yes Historical Provider, MD  HYDROcodone-acetaminophen (VICODIN) 5-500 MG per tablet Take 1 tablet by mouth every 6 (six) hours as needed. For pain 01/23/12  Yes Nadara Mustard, MD  insulin aspart (NOVOLOG) 100 UNIT/ML injection Inject 4-12 Units into the skin 3 (three) times daily before meals. Per sliding scale   Yes Historical Provider, MD  methocarbamol (ROBAXIN) 500 MG tablet Take 500 mg by mouth every 6 (six) hours as needed. For muscle spasms 01/04/12  Yes Evlyn Kanner Love, PA  multivitamin (RENA-VIT) TABS tablet Take 1 tablet by mouth daily.    Yes Historical Provider, MD  nitroGLYCERIN (NITROSTAT)  0.4 MG SL tablet Place 0.4 mg under the tongue every 5 (five) minutes as needed. For chest pain   Yes Historical Provider, MD  pantoprazole (PROTONIX) 40 MG tablet Take 40 mg by mouth at bedtime.    Yes Historical Provider, MD  polyvinyl alcohol (LIQUIFILM TEARS) 1.4 % ophthalmic solution Place 1 drop into both eyes daily as needed. For dryness   Yes Historical Provider, MD  senna-docusate (SENOKOT-S) 8.6-50 MG per tablet Take 1 tablet by mouth 2 (two) times daily. 01/04/12  Yes Evlyn Kanner Love, PA  sevelamer (RENAGEL) 800 MG tablet Take 2-4 tablets (1,600-3,200 mg total) by mouth See admin instructions. Take 4 tablets  with meals and 2 tablets with snacks 01/04/12  Yes Evlyn Kanner Love, PA  silver sulfADIAZINE (SILVADENE) 1 % cream Apply 1 application topically 2 (two) times daily. To incision site   Yes Historical Provider, MD  tetrahydrozoline 0.05 % ophthalmic solution Place 1 drop into both eyes daily.   Yes Historical Provider, MD  traMADol (ULTRAM) 50 MG tablet Take 50 mg by mouth every 8 (eight) hours as needed. For pain/neuropathy 01/04/12  Yes Evlyn Kanner Love, PA  piperacillin-tazobactam (ZOSYN) 2-0.25 GM/50ML IVPB Inject 50 mLs (2.25 g total) into the vein every 8 (eight) hours. 02/16/12   Christen Bame, MD  sodium chloride 0.9 % SOLN 100 mL with vancomycin 500 MG SOLR 500 mg Inject 500 mg into the vein every 12 (twelve) hours. 02/16/12   Christen Bame, MD   Labs:  Results for orders placed during the hospital encounter of 02/14/12 (from the past 48 hour(s))  GLUCOSE, CAPILLARY     Status: Abnormal   Collection Time   02/15/12  8:02 AM      Component Value Range Comment   Glucose-Capillary 61 (*) 70 - 99 mg/dL   GLUCOSE, CAPILLARY     Status: Normal   Collection Time   02/15/12  8:30 AM      Component Value Range Comment   Glucose-Capillary 73  70 - 99 mg/dL    Comment 1 Notify RN      Comment 2 Documented in Chart     TROPONIN I     Status: Normal   Collection Time   02/15/12 11:41 AM       Component Value Range Comment   Troponin I <0.30  <0.30 ng/mL   GLUCOSE, CAPILLARY     Status: Normal   Collection Time   02/15/12 12:02 PM      Component Value Range Comment   Glucose-Capillary 82  70 - 99 mg/dL   BASIC METABOLIC PANEL     Status: Abnormal   Collection Time   02/15/12  1:20 PM      Component Value Range Comment   Sodium 134 (*) 135 - 145 mEq/L    Potassium 3.6  3.5 - 5.1 mEq/L    Chloride 93 (*) 96 - 112 mEq/L    CO2 29  19 - 32 mEq/L    Glucose, Bld 69 (*) 70 - 99 mg/dL    BUN 8  6 - 23 mg/dL    Creatinine, Ser 4.09 (*) 0.50 - 1.10 mg/dL    Calcium 9.6  8.4 - 81.1 mg/dL    GFR calc non Af Amer 18 (*) >90 mL/min    GFR calc Af Amer 21 (*) >90 mL/min   HEMOGLOBIN A1C     Status: Normal   Collection Time   02/15/12  1:20 PM      Component Value Range Comment   Hemoglobin A1C 5.3  <5.7 %    Mean Plasma Glucose 105  <117 mg/dL   GLUCOSE, CAPILLARY     Status: Abnormal   Collection Time   02/15/12  3:40 PM      Component Value Range Comment   Glucose-Capillary 62 (*) 70 - 99 mg/dL   GLUCOSE, CAPILLARY     Status: Normal   Collection Time   02/15/12  4:17 PM      Component Value Range Comment   Glucose-Capillary 70  70 - 99 mg/dL   GLUCOSE, CAPILLARY     Status: Abnormal   Collection Time   02/15/12  4:33 PM      Component Value Range Comment   Glucose-Capillary 69 (*) 70 - 99 mg/dL   GLUCOSE, CAPILLARY     Status: Abnormal   Collection Time   02/15/12  5:39 PM      Component Value Range Comment   Glucose-Capillary 105 (*) 70 - 99 mg/dL    Comment 1 Notify RN      Comment 2 Documented in Chart     GLUCOSE, CAPILLARY     Status: Normal   Collection Time   02/15/12  8:35 PM      Component Value Range Comment   Glucose-Capillary 94  70 - 99 mg/dL   RENAL FUNCTION PANEL     Status: Abnormal   Collection Time   02/16/12  7:35 AM      Component Value Range Comment   Sodium 136  135 - 145 mEq/L    Potassium 5.0  3.5 - 5.1 mEq/L    Chloride 94 (*) 96 -  112 mEq/L    CO2 23  19 - 32 mEq/L    Glucose, Bld 147 (*) 70 - 99 mg/dL    BUN 12  6 - 23 mg/dL    Creatinine, Ser 9.56 (*) 0.50 - 1.10 mg/dL    Calcium 9.7  8.4 - 38.7 mg/dL    Phosphorus 3.2  2.3 - 4.6 mg/dL    Albumin 2.6 (*) 3.5 - 5.2 g/dL    GFR calc non Af Amer 13 (*) >90 mL/min    GFR calc Af Amer 15 (*) >90 mL/min   GLUCOSE, CAPILLARY     Status: Abnormal   Collection Time   02/16/12  8:13 AM      Component Value Range Comment   Glucose-Capillary 145 (*) 70 - 99 mg/dL    Comment 1 Notify RN      Comment 2 Documented in Chart     GLUCOSE, CAPILLARY     Status: Abnormal   Collection Time   02/16/12 12:00 PM      Component Value Range Comment   Glucose-Capillary 147 (*) 70 - 99 mg/dL    Comment 1 Notify RN      Comment 2 Documented in Chart     GLUCOSE, CAPILLARY     Status: Abnormal   Collection Time   02/16/12  4:36 PM      Component Value Range Comment   Glucose-Capillary 122 (*) 70 - 99 mg/dL   GLUCOSE, CAPILLARY     Status: Abnormal   Collection Time   02/16/12  9:52 PM      Component Value Range Comment   Glucose-Capillary 131 (*) 70 - 99 mg/dL    Constitutional: negative for chills, fatigue, fevers and sweats Respiratory: negative for cough, dyspnea on exertion, hemoptysis and sputum Cardiovascular: negative for chest pain, chest pressure/discomfort, orthopnea and palpitations Gastrointestinal: negative for abdominal pain, change in bowel habits, nausea and vomiting Genitourinary:negative, anuric Musculoskeletal:negative for arthralgias, back pain, myalgias and neck pain Neurological: negative for dizziness, headaches and paresthesia  Physical Exam: Filed Vitals:   02/17/12 0605  BP: 136/78  Pulse: 98  Temp: 97.2 F (36.2 C)  Resp: 18     General appearance: alert, cooperative and no distress Head: Normocephalic, without obvious abnormality, atraumatic Neck: no adenopathy, no carotid bruit, no JVD and supple, symmetrical, trachea midline Resp: clear  to auscultation bilaterally Cardio: regular rate and rhythm, S1, S2 normal, no murmur, click, rub or gallop GI: soft, non-tender; bowel sounds normal;  no masses,  no organomegaly Extremities: Right AKA with dressing, protective boot on left, no edema Neurologic: Grossly normal Dialysis Access: AVG @ RUA with + bruit   Assessment/Plan: 1. Revision of right AKA - with irrigation and debridement by Dr. Magnus Ivan on 12/25; awaiting placement in SNF. 2. ESRD -  HD on MWF @ [facility]; K 5 yesterday.  HD today. 3. Hypertension/volume  - BP 136/78, no meds; current wt 73.3 kg with EDW 68 kg. 4. Anemia  - Hgb 10 pre-surgery; on outpatient Epogen 1600 U.  Aranesp 100 mcg today, check CBC. 5. Metabolic bone disease -  Ca 9.7 (10.8 corrected), P 3.2, no Zemplar, Sensipar 120 mg qd, Renvela 4 with meals. 6. Nutrition - Alb 2.6. 7. DM Type 2 - Lantus and SSI. LYLES,CHARLES 02/17/2012, 6:45 AM   Attending Nephrologist: Elvis Coil, MD   I have seen and examined this patient and agree with the plan of care , patient seen on dialysis. AVG Qb 400. Remove 3 L  Audrina Marten W 02/17/2012, 7:52 AM

## 2012-02-17 NOTE — Progress Notes (Signed)
Report was call to [place] at [facility]. Getting ready to go home.

## 2012-02-17 NOTE — Progress Notes (Signed)
Internal Medicine Attending  Date: 02/17/2012  Patient name: Faith [name] Medical record number: [id number] Date of birth: 04-11-1955 Age: 56 y.o. Gender: female  I saw and evaluated the patient. I reviewed the resident's note by Dr. Anselm Jungling and I agree with the resident's findings and plans as documented in her progress note.  [name] was seen in dialysis.  Her only complaint today was pain in her stump.  The pain medication has been effective initially but is wearing off before the next dose.  We will adjust her pain regimen as appropriate.  Everyone is on board about transfer to [facility].  She is scheduled for transfer today after dialysis.

## 2012-02-17 NOTE — Progress Notes (Signed)
Patient to discharge to [facility].   Spoke with Mervin Hack RN on 02/16/2012 regarding need for antibiotics for with HD.

## 2012-02-17 NOTE — Clinical Social Work Placement (Signed)
Clinical Social Work Department CLINICAL SOCIAL WORK PLACEMENT NOTE 02/17/2012  Patient:  Faith [name], Faith [name]  Account Number:  [id number] Admit date:  02/14/2012  Clinical Social Worker:  Genelle Bal, LCSW  Date/time:  02/17/2012 03:27 AM  Clinical Social Work is seeking post-discharge placement for this patient at the following level of care:   SKILLED NURSING   (*CSW will update this form in Epic as items are completed)     Patient/family provided with [facility] Department of Clinical Social Work's list of facilities offering this level of care within the geographic area requested by the patient (or if unable, by the patient's family).  02/17/2012  Patient/family informed of their freedom to choose among providers that offer the needed level of care, that participate in Medicare, Medicaid or managed care program needed by the patient, have an available bed and are willing to accept the patient.    Patient/family informed of [facility]' ownership interest in [facility], as well as of the fact that they are under no obligation to receive care at this facility.  PASARR submitted to EDS on  PASARR number received from EDS on   FL2 transmitted to all facilities in geographic area requested by pt/family on  02/17/2012 FL2 transmitted to all facilities within larger geographic area on   Patient informed that his/her managed care company has contracts with or will negotiate with  certain facilities, including the following:     Patient/family informed of bed offers received:   Patient chooses bed at [facility] Physician recommends and patient chooses bed at    Patient to be transferred to [facility] on  02/17/2012 Patient to be transferred to facility by ambulance  The following physician request were entered in Epic:   Additional Comments: Patient has a PASARR number.

## 2012-02-17 NOTE — Progress Notes (Signed)
Subjective: Patient still has pain in her right stump otherwise no other complaints. Objective: Vital signs in last 24 hours: Filed Vitals:   02/16/12 1800 02/16/12 2200 02/17/12 0605 02/17/12 0710  BP: 123/65 121/68 136/78 122/69  Pulse: 85 79 98   Temp: 98.5 F (36.9 C) 97.2 F (36.2 C) 97.2 F (36.2 C) 98.2 F (36.8 C)  TempSrc: Oral Oral Oral Oral  Resp: 18 18 18 19   Height:      Weight:  161 lb 9.6 oz (73.3 kg)  157 lb 10.1 oz (71.5 kg)  SpO2: 95% 100% 96% 94%   Weight change: 2 lb 13.9 oz (1.3 kg)  Intake/Output Summary (Last 24 hours) at 02/17/12 0721 Last data filed at 02/16/12 2100  Gross per 24 hour  Intake    480 ml  Output      0 ml  Net    480 ml   General: alert, and cooperative to examination.  Patient was seen in dialysis. Lungs: normal respiratory effort, no accessory muscle use, mild decreased breath sounds on left lung base, no crackles, and no wheezes. Heart: normal rate, regular rhythm, no murmur, no gallop, and no rub.  Abdomen: soft, non-tender, normal bowel sounds, no distention, no guarding, no rebound tenderness Extremities: s/p right AKA revision wrapped with bandage. Right upper extremity:AV fistula with thrills and bruit.  Neurologic:nonfocal  Lab Results: Basic Metabolic Panel:  Lab 02/16/12 1610 02/15/12 1320  NA 136 134*  K 5.0 3.6  CL 94* 93*  CO2 23 29  GLUCOSE 147* 69*  BUN 12 8  CREATININE 3.64* 2.74*  CALCIUM 9.7 9.6  MG -- --  PHOS 3.2 --   Liver Function Tests:  Lab 02/16/12 0735  AST --  ALT --  ALKPHOS --  BILITOT --  PROT --  ALBUMIN 2.6*   CBC:  Lab 02/15/12 0535 02/15/12 0126 02/14/12 1829  WBC 7.7 7.8 --  NEUTROABS -- -- 5.4  HGB 10.0* 9.5* --  HCT 32.0* 31.7* --  MCV 76.9* 77.9* --  PLT 372 350 --   Cardiac Enzymes:  Lab 02/15/12 1141  CKTOTAL --  CKMB --  CKMBINDEX --  TROPONINI <0.30   CBG:  Lab 02/16/12 2152 02/16/12 1636 02/16/12 1200 02/16/12 0813 02/15/12 2035 02/15/12 1739  GLUCAP  131* 122* 147* 145* 94 105*   Medications: medication reviewed Scheduled Meds:   . aspirin EC  81 mg Oral Daily  . cinacalcet  30 mg Oral Q breakfast  . cinacalcet  90 mg Oral QPM  . darbepoetin (ARANESP) injection - DIALYSIS  100 mcg Intravenous Q Fri-HD  . ezetimibe  10 mg Oral Daily  . fenofibrate  160 mg Oral Daily  . gabapentin  300 mg Oral QHS  . heparin  5,000 Units Subcutaneous Q8H  . insulin aspart  0-15 Units Subcutaneous TID WC  . multivitamin  1 tablet Oral QHS  . ondansetron  4 mg Oral Q6H  . pantoprazole  40 mg Oral QHS  . piperacillin-tazobactam (ZOSYN)  IV  2.25 g Intravenous Q8H  . senna-docusate  1 tablet Oral BID  . sevelamer  3,200 mg Oral TID WC  . vancomycin  500 mg Intravenous Once  . vancomycin  500 mg Intravenous NOW   Continuous Infusions:  PRN Meds:.acetaminophen, albuterol, guaiFENesin, HYDROmorphone (DILAUDID) injection, oxyCODONE-acetaminophen, polyethylene glycol, polyvinyl alcohol, promethazine Assessment/Plan: Faith [name] 57 yo AA woman complicated pmh most significant for ESRD HD dependent, DM, PAD, CABG 2002 p/w worsening AKA stump pain and  drainage s/p revision surgery on 01/17/12 2/2 gangrene and osteomyelitis.   1. AKA stump infection post op day #3. Stable. From ortho's standpoint, she was ready for discharge yesterday, however, her husband would like for her to go to nursing home for rehab.  I spoke to home care coordinator at [facility] program and Dr. Dorothe Pea and they approved for [facility].  She will be able to go today.  -cont vanc/zosyn for presumed osteomyelitis pending culture results. -Pain control with Oxycodone and dilaudid PRN  2. ESRD: pt usual dialysis M, W, F at [address] but 2/2 holidays pt had dialysis Sun, T, scheduled Friday.  -HD today  3. DM type 2:  HgbA1c on 02/15/12 was 5.3. Her CBGs have been 120-140's, well controlled with SSI   Dispo: Disposition is deferred at this time, awaiting improvement of current medical  problems.  Anticipated discharge today to [facility]   The patient does have a current PCP Thane Edu, MD), therefore will not require OPC follow-up after discharge.   The patient does not have transportation limitations that hinder transportation to clinic appointments.  .Services Needed at time of discharge: Y = Yes, Blank = No PT: Y  OT: Y  RN: Y  Equipment:   Other:     LOS: 3 days   Faith [name] 02/17/2012, 7:21 AM

## 2012-02-17 NOTE — Progress Notes (Signed)
ANTIBIOTIC CONSULT NOTE - FOLLOW UP  Pharmacy Consult for vancomycin Indication: stump infection , suspected osteo  Allergies  Allergen Reactions  . Lipitor (Atorvastatin Calcium) Other (See Comments)    Muscle weakness  . Codeine Nausea And Vomiting  . Metoclopramide Hcl Other (See Comments)    Tardive dyskinesia    Patient Measurements: Height: 5' 4.57" (164 cm) Weight: 157 lb 10.1 oz (71.5 kg) IBW/kg (Calculated) : 56    Vital Signs: Temp: 98.2 F (36.8 C) (12/27 0710) Temp src: Oral (12/27 0710) BP: 114/65 mmHg (12/27 0900) Pulse Rate: 81  (12/27 0900) Intake/Output from previous day: 12/26 0701 - 12/27 0700 In: 480 [P.O.:480] Out: -  Intake/Output from this shift:    Labs:  Basename 02/17/12 0734 02/16/12 0735 02/15/12 1320 02/15/12 0535 02/15/12 0126  WBC 13.4* -- -- 7.7 7.8  HGB 8.9* -- -- 10.0* 9.5*  PLT 366 -- -- 372 350  LABCREA -- -- -- -- --  CREATININE 4.76* 3.64* 2.74* -- --   Estimated Creatinine Clearance: 12.8 ml/min (by C-G formula based on Cr of 4.76). No results found for this basename: VANCOTROUGH:2,VANCOPEAK:2,VANCORANDOM:2,GENTTROUGH:2,GENTPEAK:2,GENTRANDOM:2,TOBRATROUGH:2,TOBRAPEAK:2,TOBRARND:2,AMIKACINPEAK:2,AMIKACINTROU:2,AMIKACIN:2, in the last 72 hours   Microbiology: Recent Results (from the past 720 hour(s))  MRSA PCR SCREENING     Status: Normal   Collection Time   02/15/12  2:48 AM      Component Value Range Status Comment   MRSA by PCR NEGATIVE  NEGATIVE Final     Anti-infectives     Start     Dose/Rate Route Frequency Ordered Stop   02/16/12 1200   vancomycin (VANCOCIN) 500 mg in sodium chloride 0.9 % 100 mL IVPB  Status:  Discontinued        500 mg 50 mL/hr over 120 Minutes Intravenous NOW 02/16/12 1101 02/17/12 0920   02/16/12 0200  piperacillin-tazobactam (ZOSYN) IVPB 2.25 g       2.25 g 100 mL/hr over 30 Minutes Intravenous 3 times per day 02/15/12 1823     02/16/12 0000  piperacillin-tazobactam (ZOSYN) 2-0.25  GM/50ML IVPB       2.25 g 100 mL/hr over 30 Minutes Intravenous Every 8 hours 02/16/12 1128     02/16/12 0000   sodium chloride 0.9 % SOLN 100 mL with vancomycin 500 MG SOLR 500 mg        500 mg 50 mL/hr over 120 Minutes Intravenous Every 12 hours 02/16/12 1128     02/15/12 0600   piperacillin-tazobactam (ZOSYN) IVPB 2.25 g  Status:  Discontinued        2.25 g 100 mL/hr over 30 Minutes Intravenous 3 times per day 02/15/12 0238 02/15/12 1823   02/15/12 0330   vancomycin (VANCOCIN) 500 mg in sodium chloride 0.9 % 100 mL IVPB  Status:  Discontinued        500 mg 100 mL/hr over 60 Minutes Intravenous  Once 02/15/12 0238 02/17/12 0920   02/14/12 2315   vancomycin (VANCOCIN) IVPB 1000 mg/200 mL premix        1,000 mg 200 mL/hr over 60 Minutes Intravenous  Once 02/14/12 2307 02/15/12 0130   02/14/12 2315  piperacillin-tazobactam (ZOSYN) IVPB 3.375 g       3.375 g 100 mL/hr over 30 Minutes Intravenous  Once 02/14/12 2307 02/15/12 0030          Assessment: Patient is a 56 y.o F on vancomycin for suspected osteo from AKA stump.  Plan to cont vancomycin and zosyn for now with plan for possible d/c to [facility]  today.  Vancomycin 500mg  dose ordered yesterday was not given d/t no IV access.  Patient is currently in dialysis.  Goal of Therapy:  Pre-HD vancomycin level 15-25  Plan:  1) vancomycin 1gm IV x1 after HD session today 2) recom vancomycin 750mg  qHD for discharge and consider changing zosyn to fortaz (2gm qHD) for ease of administration if to d/c home on abx  Asa Baudoin P 02/17/2012,9:22 AM

## 2012-02-17 NOTE — Discharge Summary (Signed)
Please note that the patient did receive dialysis on the day of discharge.

## 2012-02-17 NOTE — Clinical Social Work Note (Signed)
CSW advised during morning progression that patient and husband had decided on going to SNF and as patient is a [facility] participant, she will be going to [facility] today.  CSW talked with patient in dialysis after she spoke with her PCP and she is going to [facility] for ST rehab.  CSW advised that once d/c paperwork completed, CSW will facilitate discharge to SNF. Patient appreciative of CSW assistance.  Discharge paperwork forwarded to facility and CSW assisted with transport via ambulance to [facility].  Genelle Bal, MSW, LCSW [phone]

## 2012-02-17 NOTE — Clinical Social Work Psychosocial (Signed)
Clinical Social Work Department BRIEF PSYCHOSOCIAL ASSESSMENT 02/17/2012  Patient:  Faith [name], Faith [name]     Account Number:  [id number]     Admit date:  02/14/2012  Clinical Social Worker:  Delmer Islam  Date/Time:  02/17/2012 03:17 AM  Referred by:  Physician  Date Referred:  02/16/2012 Referred for  SNF Placement   Other Referral:   Interview type:  Patient Other interview type:    PSYCHOSOCIAL DATA Living Status:  HUSBAND Admitted from facility:   Level of care:   Primary support name:  Elio Forget Primary support relationship to patient:  SPOUSE Degree of support available:   Good support    CURRENT CONCERNS Current Concerns  Post-Acute Placement   Other Concerns:    SOCIAL WORK ASSESSMENT / PLAN On 02/16/12 CSW and nurse case manager Darlyne Russian talked with patieint regarding discharge plans. At that time patient's plan was to dishcarge home. When asked, patient stated that she would consider SNF if the MD really feels she needs ST rehab at a facility.    CSW advised patient that we would continue to follow   Assessment/plan status:  Psychosocial Support/Ongoing Assessment of Needs Other assessment/ plan:   Information/referral to community resources:   No resources requested or needed at this time.    PATIENT'S/FAMILY'S RESPONSE TO PLAN OF CARE: Patient very pleasant and receptive to talking with CSW and nurse case manager. Patient want to go home but is willing to reconsider if needed.

## 2012-02-17 NOTE — Progress Notes (Signed)
Nutrition Consult   Chart reviewed and talked with pt in HD unit . She is being d/c to [facility] when dialysis is completed today. She describes appetite as poor currently. Her intake is inadequate to meet nutritional needs recent po intake 0% off recent meals. Recommend she be d/c with Nepro BID between meals due to decreased appetite.  #[phone]

## 2012-02-20 ENCOUNTER — Encounter (HOSPITAL_COMMUNITY): Payer: Self-pay | Admitting: Orthopaedic Surgery

## 2012-02-20 LAB — GLUCOSE, CAPILLARY

## 2012-03-13 ENCOUNTER — Other Ambulatory Visit (HOSPITAL_COMMUNITY): Payer: Self-pay | Admitting: Nephrology

## 2012-03-13 DIAGNOSIS — N186 End stage renal disease: Secondary | ICD-10-CM

## 2012-03-20 ENCOUNTER — Other Ambulatory Visit (HOSPITAL_COMMUNITY): Payer: Self-pay | Admitting: Nephrology

## 2012-03-20 ENCOUNTER — Ambulatory Visit (HOSPITAL_COMMUNITY)
Admission: RE | Admit: 2012-03-20 | Discharge: 2012-03-20 | Disposition: A | Discharge status: Home / Self Care | Payer: Medicare (Managed Care) | Source: Ambulatory Visit | Attending: Nephrology | Admitting: Nephrology

## 2012-03-20 DIAGNOSIS — T82898A Other specified complication of vascular prosthetic devices, implants and grafts, initial encounter: Secondary | ICD-10-CM | POA: Insufficient documentation

## 2012-03-20 DIAGNOSIS — E119 Type 2 diabetes mellitus without complications: Secondary | ICD-10-CM | POA: Insufficient documentation

## 2012-03-20 DIAGNOSIS — N2581 Secondary hyperparathyroidism of renal origin: Secondary | ICD-10-CM | POA: Insufficient documentation

## 2012-03-20 DIAGNOSIS — N186 End stage renal disease: Secondary | ICD-10-CM

## 2012-03-20 DIAGNOSIS — I871 Compression of vein: Secondary | ICD-10-CM | POA: Insufficient documentation

## 2012-03-20 DIAGNOSIS — Z9861 Coronary angioplasty status: Secondary | ICD-10-CM | POA: Insufficient documentation

## 2012-03-20 DIAGNOSIS — I120 Hypertensive chronic kidney disease with stage 5 chronic kidney disease or end stage renal disease: Secondary | ICD-10-CM | POA: Insufficient documentation

## 2012-03-20 DIAGNOSIS — I25810 Atherosclerosis of coronary artery bypass graft(s) without angina pectoris: Secondary | ICD-10-CM | POA: Insufficient documentation

## 2012-03-20 DIAGNOSIS — Y832 Surgical operation with anastomosis, bypass or graft as the cause of abnormal reaction of the patient, or of later complication, without mention of misadventure at the time of the procedure: Secondary | ICD-10-CM | POA: Insufficient documentation

## 2012-03-20 MED ORDER — IOHEXOL 300 MG/ML  SOLN
100.0000 mL | Freq: Once | INTRAMUSCULAR | Status: AC | PRN
  Administered 2012-03-20: 1 mL via INTRAVENOUS

## 2012-03-20 NOTE — H&P (Signed)
Faith [name] is an 57 y.o. female.   Chief Complaint: decreased dialysis graft flow rate HPI: Patient with ESRD, decreased right arm AVG access flow rates and recurrent venous stenosis presents today for angioplasty/possible stenting of stenosis or placement of new catheter if needed.   Past Medical History  Diagnosis Date  . Hyperparathyroidism, secondary renal   . CAD (coronary artery disease) of artery bypass graft     CABG in 2002  . PAD (peripheral artery disease)     Not a candidate for revascularization  . Chronic constipation   . GERD (gastroesophageal reflux disease)   . Anemia of chronic renal failure   . Tardive dyskinesia     Secondary to Reglan  . Depression   . Neuropathy   . Diabetic retinopathy(362.0)   . Diastolic CHF, chronic     Grade 2  . Pulmonary embolism 06/2003  . Hyperlipidemia   . Stroke 2002    "w/left side" residual weakness  . Diabetes mellitus     type II  . CHF (congestive heart failure)   . CVA (cerebral infarction)     right brain with minimal left hemiparesis  . Pneumonia 10/2009; 11/2009    ?pneumonia 01/17/11  . Hypertension   . Arthritis   . Complication of anesthesia     "sleepy afterwards; due to her being dialysis pt"  . Myocardial infarction 2002  . ESRD on dialysis     [name]; M, W, F; via right upper arm AVF (01/18/2012)    Past Surgical History  Procedure Date  . Coronary artery bypass graft 2002  . Laparoscopic cholecystectomy 2002  . Av fistula placement     times 4-using rt upper arm graft-old rt lower arm,lt upper and lower old grafts  . Trigger finger release   . Tubal ligation   . Eye examination under anesthesia w/ retinal cryotherapy and retinal laser   . Cataract extraction 07/2010    right sided   . Toe amputation 5/12    left 2nd toe secondary to osteomyelitis  . Breast biopsy 06/2010  . Carpal tunnel release 05/05/2011    Procedure: CARPAL TUNNEL RELEASE;  Surgeon: Sharma Covert, MD;  Location: [facility]  ;  Service: Orthopedics;  Laterality: Right;  . Coronary angioplasty with stent placement   . Amputation 11/24/2011    Procedure: AMPUTATION RAY;  Surgeon: Nadara Mustard, MD;  Location: [facility] OR;  Service: Orthopedics;  Laterality: Right;  Right Foot 2nd Ray Amputation  . Amputation 12/12/2011    Procedure: AMPUTATION BELOW KNEE;  Surgeon: Nadara Mustard, MD;  Location: [facility] OR;  Service: Orthopedics;  Laterality: Right;  . 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 Amputation 01/17/2012    Procedure: AMPUTATION ABOVE KNEE;  Surgeon: Nadara Mustard, MD;  Location: [facility] OR;  Service: Orthopedics;  Laterality: Right;  Right Above Knee Amputation  . I&d extremity 02/15/2012    Procedure: IRRIGATION AND DEBRIDEMENT EXTREMITY;  Surgeon: Kathryne Hitch, MD;  Location: [facility] OR;  Service: Orthopedics;  Laterality: Right;    Family History  Problem Relation Age of Onset  . Diabetes Mother   . Cirrhosis Mother   . Diabetes Father   . Heart attack Father   . Hyperlipidemia Father   . Heart disease Father   . Diabetes Sister   . Hyperlipidemia Sister   . Hypertension Sister   . Diabetes Sister   . Diabetes Sister   . Heart disease  Brother   . Hyperlipidemia Brother    Social History:  reports that she has never smoked. She has never used smokeless tobacco. She reports that she drinks alcohol. She reports that she uses illicit drugs (Marijuana).  Allergies:  Allergies  Allergen Reactions  . Lipitor (Atorvastatin Calcium) Other (See Comments)    Muscle weakness  . Codeine Nausea And Vomiting  . Metoclopramide Hcl Other (See Comments)    Tardive dyskinesia    Current outpatient prescriptions:benzonatate (TESSALON) 100 MG capsule, Take 100 mg by mouth 3 (three) times daily as needed. For cough, Disp: , Rfl: ;  cinacalcet (SENSIPAR) 30 MG tablet, Take 30 mg by mouth daily. , Disp: , Rfl: ;  cinacalcet (SENSIPAR) 90 MG tablet, Take 90 mg by mouth every evening., Disp: ,  Rfl:  darbepoetin (ARANESP) 100 MCG/0.5ML SOLN, Inject 0.5 mLs (100 mcg total) into the vein every Friday with hemodialysis., Disp: 4.2 mL, Rfl: 0;  doxycycline (ADOXA) 100 MG tablet, Take 100 mg by mouth daily. For 30 days. Stop date 04/07/12, Disp: , Rfl: ;  fluticasone (FLONASE) 50 MCG/ACT nasal spray, Place 2 sprays into the nose 2 (two) times daily., Disp: , Rfl: ;  gabapentin (NEURONTIN) 400 MG capsule, Take 400 mg by mouth at bedtime., Disp: , Rfl:  HYDROcodone-acetaminophen (NORCO/VICODIN) 5-325 MG per tablet, Take 1-2 tablets by mouth every 4 (four) hours as needed. For pain, Disp: , Rfl: ;  menthol-cetylpyridinium (CEPACOL) 3 MG lozenge, Take 1 lozenge by mouth every 2 (two) hours as needed. For cough, Disp: , Rfl: ;  multivitamin (RENA-VIT) TABS tablet, Take 1 tablet by mouth daily. , Disp: , Rfl:  nitroGLYCERIN (NITROSTAT) 0.4 MG SL tablet, Place 0.4 mg under the tongue every 5 (five) minutes as needed. For chest pain, Disp: , Rfl: ;  nystatin (MYCOSTATIN) 100000 UNIT/ML suspension, Take 500,000 Units by mouth 4 (four) times daily., Disp: , Rfl: ;  oxyCODONE (OXY IR/ROXICODONE) 5 MG immediate release tablet, Take 5-10 mg by mouth every 4 (four) hours as needed. For pain, Disp: , Rfl:  pantoprazole (PROTONIX) 40 MG tablet, Take 40 mg by mouth at bedtime. , Disp: , Rfl: ;  polyethylene glycol (MIRALAX / GLYCOLAX) packet, Take 17 g by mouth daily., Disp: , Rfl: ;  polyvinyl alcohol (LIQUIFILM TEARS) 1.4 % ophthalmic solution, Place 1 drop into both eyes daily as needed. For dryness, Disp: , Rfl:  sevelamer (RENAGEL) 800 MG tablet, Take 2-4 tablets (1,600-3,200 mg total) by mouth See admin instructions. Take 4 tablets with meals and 2 tablets with snacks, Disp: , Rfl: ;  sodium chloride (OCEAN) 0.65 % nasal spray, Place 2 sprays into the nose 4 (four) times daily as needed. For nasal congestion, Disp: , Rfl:   No results found for this or any previous visit (from the past 48 hour(s)). No results  found.  Review of Systems  Constitutional: Negative for fever and chills.  Respiratory: Negative for shortness of breath.        Occ cough  Cardiovascular: Negative for chest pain.  Gastrointestinal: Negative for nausea, vomiting and abdominal pain.  Musculoskeletal: Positive for back pain.  Neurological: Negative for headaches.  Endo/Heme/Allergies: Does not bruise/bleed easily.    There were no vitals taken for this visit. Physical Exam  Constitutional: She is oriented to person, place, and time. She appears well-developed and well-nourished.  Cardiovascular: Normal rate and regular rhythm.        Rt arm AVG with + thrill/bruit  Respiratory: Effort normal and breath  sounds normal.  GI: Soft. Bowel sounds are normal.  Neurological: She is alert and oriented to person, place, and time.     Assessment/Plan Pt with ESRD and recurrent venous stenosis of right arm AVG . Plan is for angioplasty/possible stenting of stenosis or placement of new catheter if needed. Details of above d/w pt with her understanding and consent.  Ronnette Rump,D KEVIN 03/20/2012, 2:59 PM

## 2012-03-21 ENCOUNTER — Telehealth (HOSPITAL_COMMUNITY): Payer: Self-pay | Admitting: *Deleted

## 2012-04-26 ENCOUNTER — Ambulatory Visit: Payer: Medicare (Managed Care) | Admitting: Vascular Surgery

## 2012-05-10 ENCOUNTER — Ambulatory Visit: Payer: Medicare (Managed Care) | Admitting: Vascular Surgery

## 2012-06-05 ENCOUNTER — Other Ambulatory Visit (HOSPITAL_COMMUNITY): Payer: Self-pay | Admitting: Nephrology

## 2012-06-05 DIAGNOSIS — N186 End stage renal disease: Secondary | ICD-10-CM

## 2012-06-07 ENCOUNTER — Ambulatory Visit (HOSPITAL_COMMUNITY): Admission: RE | Admit: 2012-06-07 | Payer: Medicare (Managed Care) | Source: Ambulatory Visit

## 2012-06-07 ENCOUNTER — Other Ambulatory Visit (HOSPITAL_COMMUNITY): Payer: Self-pay | Admitting: Radiology

## 2012-06-07 ENCOUNTER — Other Ambulatory Visit (HOSPITAL_COMMUNITY): Payer: Self-pay | Admitting: Nephrology

## 2012-06-07 ENCOUNTER — Encounter (HOSPITAL_COMMUNITY): Payer: Self-pay

## 2012-06-07 ENCOUNTER — Ambulatory Visit (HOSPITAL_COMMUNITY)
Admission: RE | Admit: 2012-06-07 | Discharge: 2012-06-07 | Disposition: A | Discharge status: Home / Self Care | Payer: Medicare (Managed Care) | Source: Ambulatory Visit | Attending: Nephrology | Admitting: Nephrology

## 2012-06-07 DIAGNOSIS — S78119A Complete traumatic amputation at level between unspecified hip and knee, initial encounter: Secondary | ICD-10-CM | POA: Insufficient documentation

## 2012-06-07 DIAGNOSIS — E119 Type 2 diabetes mellitus without complications: Secondary | ICD-10-CM | POA: Insufficient documentation

## 2012-06-07 DIAGNOSIS — I2510 Atherosclerotic heart disease of native coronary artery without angina pectoris: Secondary | ICD-10-CM | POA: Insufficient documentation

## 2012-06-07 DIAGNOSIS — I120 Hypertensive chronic kidney disease with stage 5 chronic kidney disease or end stage renal disease: Secondary | ICD-10-CM | POA: Insufficient documentation

## 2012-06-07 DIAGNOSIS — Y832 Surgical operation with anastomosis, bypass or graft as the cause of abnormal reaction of the patient, or of later complication, without mention of misadventure at the time of the procedure: Secondary | ICD-10-CM | POA: Insufficient documentation

## 2012-06-07 DIAGNOSIS — T82898A Other specified complication of vascular prosthetic devices, implants and grafts, initial encounter: Secondary | ICD-10-CM | POA: Insufficient documentation

## 2012-06-07 DIAGNOSIS — I871 Compression of vein: Secondary | ICD-10-CM | POA: Insufficient documentation

## 2012-06-07 DIAGNOSIS — N186 End stage renal disease: Secondary | ICD-10-CM

## 2012-06-07 DIAGNOSIS — Z79899 Other long term (current) drug therapy: Secondary | ICD-10-CM | POA: Insufficient documentation

## 2012-06-07 DIAGNOSIS — I69959 Hemiplegia and hemiparesis following unspecified cerebrovascular disease affecting unspecified side: Secondary | ICD-10-CM | POA: Insufficient documentation

## 2012-06-07 DIAGNOSIS — I5032 Chronic diastolic (congestive) heart failure: Secondary | ICD-10-CM | POA: Insufficient documentation

## 2012-06-07 DIAGNOSIS — E7800 Pure hypercholesterolemia, unspecified: Secondary | ICD-10-CM | POA: Insufficient documentation

## 2012-06-07 DIAGNOSIS — D631 Anemia in chronic kidney disease: Secondary | ICD-10-CM | POA: Insufficient documentation

## 2012-06-07 DIAGNOSIS — I509 Heart failure, unspecified: Secondary | ICD-10-CM | POA: Insufficient documentation

## 2012-06-07 DIAGNOSIS — I252 Old myocardial infarction: Secondary | ICD-10-CM | POA: Insufficient documentation

## 2012-06-07 MED ORDER — IOHEXOL 300 MG/ML  SOLN
100.0000 mL | Freq: Once | INTRAMUSCULAR | Status: AC | PRN
  Administered 2012-06-07: 50 mL via INTRAVENOUS

## 2012-06-07 MED ORDER — DIPHENHYDRAMINE HCL 50 MG/ML IJ SOLN
INTRAMUSCULAR | Status: AC
  Administered 2012-06-07: 25 mg
  Filled 2012-06-07: qty 1

## 2012-06-07 NOTE — H&P (Signed)
Agree 

## 2012-06-07 NOTE — H&P (Signed)
Faith [name] is an 57 y.o. female.   Chief Complaint: Right arm dialysis graft slow shuntogram today reveals stenosis Scheduled now for angioplasty/stent placement Last intervention: pta 03/22/12 She complains of facial and rt arm itching now No SOB; NO swelling "happens almost every time I come here" HPI: ESRD; CAD/MI; PAD; anemia; tardive dyskinesia; DM; CHF; HLD; CVA; HTN Probable contrast allergy; flag chart-may need pre meds from now on  Past Medical History  Diagnosis Date  . Hyperparathyroidism, secondary renal   . CAD (coronary artery disease) of artery bypass graft     CABG in 2002  . PAD (peripheral artery disease)     Not a candidate for revascularization  . Chronic constipation   . GERD (gastroesophageal reflux disease)   . Anemia of chronic renal failure   . Tardive dyskinesia     Secondary to Reglan  . Depression   . Neuropathy   . Diabetic retinopathy(362.0)   . Diastolic CHF, chronic     Grade 2  . Pulmonary embolism 06/2003  . Hyperlipidemia   . Stroke 2002    "w/left side" residual weakness  . Diabetes mellitus     type II  . CHF (congestive heart failure)   . CVA (cerebral infarction)     right brain with minimal left hemiparesis  . Pneumonia 10/2009; 11/2009    ?pneumonia 01/17/11  . Hypertension   . Arthritis   . Complication of anesthesia     "sleepy afterwards; due to her being dialysis pt"  . Myocardial infarction 2002  . ESRD on dialysis     Faith [name]; M, W, F; via right upper arm AVF (01/18/2012)    Past Surgical History  Procedure Laterality Date  . Coronary artery bypass graft  2002  . Laparoscopic cholecystectomy  2002  . Av fistula placement      times 4-using rt upper arm graft-old rt lower arm,lt upper and lower old grafts  . Trigger finger release    . Tubal ligation    . Eye examination under anesthesia w/ retinal cryotherapy and retinal laser    . Cataract extraction  07/2010    right sided   . Toe amputation  5/12   left 2nd toe secondary to osteomyelitis  . Breast biopsy  06/2010  . Carpal tunnel release  05/05/2011    Procedure: CARPAL TUNNEL RELEASE;  Surgeon: Faith Covert, MD;  Location: [facility];  Service: Orthopedics;  Laterality: Right;  . Coronary angioplasty with stent placement    . Amputation  11/24/2011    Procedure: AMPUTATION RAY;  Surgeon: Faith Mustard, MD;  Location: [facility] OR;  Service: Orthopedics;  Laterality: Right;  Right Foot 2nd Ray Amputation  . Amputation  12/12/2011    Procedure: AMPUTATION BELOW KNEE;  Surgeon: Faith Mustard, MD;  Location: [facility] OR;  Service: Orthopedics;  Laterality: Right;  . 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 Amputation  01/17/2012    Procedure: AMPUTATION ABOVE KNEE;  Surgeon: Faith Mustard, MD;  Location: [facility] OR;  Service: Orthopedics;  Laterality: Right;  Right Above Knee Amputation  . I&d extremity  02/15/2012    Procedure: IRRIGATION AND DEBRIDEMENT EXTREMITY;  Surgeon: Faith Hitch, MD;  Location: [facility] OR;  Service: Orthopedics;  Laterality: Right;    Family History  Problem Relation Age of Onset  . Diabetes Mother   . Cirrhosis Mother   . Diabetes Father   . Heart attack Father   .  Hyperlipidemia Father   . Heart disease Father   . Diabetes Sister   . Hyperlipidemia Sister   . Hypertension Sister   . Diabetes Sister   . Diabetes Sister   . Heart disease Brother   . Hyperlipidemia Brother    Social History:  reports that she has never smoked. She has never used smokeless tobacco. She reports that  drinks alcohol. She reports that she uses illicit drugs (Marijuana).  Allergies:  Allergies  Allergen Reactions  . Lipitor (Atorvastatin Calcium) Other (See Comments)    Muscle weakness  . Codeine Nausea And Vomiting  . Metoclopramide Hcl Other (See Comments)    Tardive dyskinesia     (Not in a hospital admission)  No results found for this or any previous visit (from the past 48 hour(s)). No results  found.  Review of Systems  Constitutional: Negative for fever and weight loss.  HENT: Negative for neck pain.   Eyes: Negative for blurred vision and double vision.  Respiratory: Negative for cough.   Cardiovascular: Negative for chest pain.  Gastrointestinal: Negative for nausea and vomiting.  Musculoskeletal: Negative for back pain.  Neurological: Negative for dizziness, weakness and headaches.    There were no vitals taken for this visit. Physical Exam  Constitutional: She is oriented to person, place, and time. She appears well-developed and well-nourished.  Cardiovascular: Normal rate, regular rhythm and normal heart sounds.   No murmur heard. Respiratory: Effort normal and breath sounds normal. She has no wheezes.  GI: Soft. Bowel sounds are normal. There is no tenderness.  Musculoskeletal:  R BKA  Neurological: She is alert and oriented to person, place, and time.  Psychiatric: She has a normal mood and affect. Her behavior is normal. Judgment and thought content normal.     Assessment/Plan Rt arm dialysis graft slow shuntogram shows stenosis Scheduled now for pta/stent Pt aware of procedure benefits and risks and agreeable to proceed Consent signed and in chart Pt has "itching at face and rt arm"- no SOB; no swelling Will flag chart for prob dye allergy  Faith [name] A 06/07/2012, 8:07 AM

## 2012-06-07 NOTE — Procedures (Signed)
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  Good response to 7 mm and 10 mm angioplasty respectively.

## 2012-08-03 ENCOUNTER — Other Ambulatory Visit (HOSPITAL_COMMUNITY): Payer: Self-pay | Admitting: Nephrology

## 2012-08-03 DIAGNOSIS — N186 End stage renal disease: Secondary | ICD-10-CM

## 2012-08-07 ENCOUNTER — Other Ambulatory Visit (HOSPITAL_COMMUNITY): Payer: Self-pay | Admitting: Nephrology

## 2012-08-07 ENCOUNTER — Ambulatory Visit (HOSPITAL_COMMUNITY)
Admission: RE | Admit: 2012-08-07 | Discharge: 2012-08-07 | Disposition: A | Discharge status: Home / Self Care | Payer: Medicare (Managed Care) | Source: Ambulatory Visit | Attending: Nephrology | Admitting: Nephrology

## 2012-08-07 DIAGNOSIS — Z86711 Personal history of pulmonary embolism: Secondary | ICD-10-CM | POA: Insufficient documentation

## 2012-08-07 DIAGNOSIS — T82898A Other specified complication of vascular prosthetic devices, implants and grafts, initial encounter: Secondary | ICD-10-CM | POA: Insufficient documentation

## 2012-08-07 DIAGNOSIS — I5032 Chronic diastolic (congestive) heart failure: Secondary | ICD-10-CM | POA: Insufficient documentation

## 2012-08-07 DIAGNOSIS — I252 Old myocardial infarction: Secondary | ICD-10-CM | POA: Insufficient documentation

## 2012-08-07 DIAGNOSIS — F1210 Cannabis abuse, uncomplicated: Secondary | ICD-10-CM | POA: Insufficient documentation

## 2012-08-07 DIAGNOSIS — N186 End stage renal disease: Secondary | ICD-10-CM

## 2012-08-07 DIAGNOSIS — I69959 Hemiplegia and hemiparesis following unspecified cerebrovascular disease affecting unspecified side: Secondary | ICD-10-CM | POA: Insufficient documentation

## 2012-08-07 DIAGNOSIS — Z91041 Radiographic dye allergy status: Secondary | ICD-10-CM | POA: Insufficient documentation

## 2012-08-07 DIAGNOSIS — Z888 Allergy status to other drugs, medicaments and biological substances status: Secondary | ICD-10-CM | POA: Insufficient documentation

## 2012-08-07 DIAGNOSIS — I2510 Atherosclerotic heart disease of native coronary artery without angina pectoris: Secondary | ICD-10-CM | POA: Insufficient documentation

## 2012-08-07 DIAGNOSIS — E785 Hyperlipidemia, unspecified: Secondary | ICD-10-CM | POA: Insufficient documentation

## 2012-08-07 DIAGNOSIS — Y832 Surgical operation with anastomosis, bypass or graft as the cause of abnormal reaction of the patient, or of later complication, without mention of misadventure at the time of the procedure: Secondary | ICD-10-CM | POA: Insufficient documentation

## 2012-08-07 DIAGNOSIS — M129 Arthropathy, unspecified: Secondary | ICD-10-CM | POA: Insufficient documentation

## 2012-08-07 DIAGNOSIS — I871 Compression of vein: Secondary | ICD-10-CM | POA: Insufficient documentation

## 2012-08-07 DIAGNOSIS — I509 Heart failure, unspecified: Secondary | ICD-10-CM | POA: Insufficient documentation

## 2012-08-07 DIAGNOSIS — N2581 Secondary hyperparathyroidism of renal origin: Secondary | ICD-10-CM | POA: Insufficient documentation

## 2012-08-07 DIAGNOSIS — G589 Mononeuropathy, unspecified: Secondary | ICD-10-CM | POA: Insufficient documentation

## 2012-08-07 DIAGNOSIS — Z992 Dependence on renal dialysis: Secondary | ICD-10-CM | POA: Insufficient documentation

## 2012-08-07 DIAGNOSIS — E119 Type 2 diabetes mellitus without complications: Secondary | ICD-10-CM | POA: Insufficient documentation

## 2012-08-07 DIAGNOSIS — I739 Peripheral vascular disease, unspecified: Secondary | ICD-10-CM | POA: Insufficient documentation

## 2012-08-07 DIAGNOSIS — I120 Hypertensive chronic kidney disease with stage 5 chronic kidney disease or end stage renal disease: Secondary | ICD-10-CM | POA: Insufficient documentation

## 2012-08-07 DIAGNOSIS — Z885 Allergy status to narcotic agent status: Secondary | ICD-10-CM | POA: Insufficient documentation

## 2012-08-07 MED ORDER — IOHEXOL 300 MG/ML  SOLN
100.0000 mL | Freq: Once | INTRAMUSCULAR | Status: AC | PRN
  Administered 2012-08-07: 50 mL via INTRAVENOUS

## 2012-08-07 NOTE — H&P (Signed)
Agree 

## 2012-08-07 NOTE — Procedures (Signed)
Procedure:  Right arm shuntogram with venous angioplasty x 2 Findings:  Recurrent axillary vein stenosis treated with 7 mm and 8 mm angioplasty.  Recurrent innominate vein stenosis treated with 10 mm angioplasty.  Good results.

## 2012-08-07 NOTE — H&P (Signed)
Faith [name] is an 57 y.o. female.   Chief Complaint: Rt arm dialysis graft stenosis per shuntogram Scheduled now for angioplasty/stent placement HPI: ESRD; CAD/MI; PAD; tardive dyskinesia; CHF; CVA; HLD; DM; HTN  Past Medical History  Diagnosis Date  . Hyperparathyroidism, secondary renal   . CAD (coronary artery disease) of artery bypass graft     CABG in 2002  . PAD (peripheral artery disease)     Not a candidate for revascularization  . Chronic constipation   . GERD (gastroesophageal reflux disease)   . Anemia of chronic renal failure   . Tardive dyskinesia     Secondary to Reglan  . Depression   . Neuropathy   . Diabetic retinopathy   . Diastolic CHF, chronic     Grade 2  . Pulmonary embolism 06/2003  . Hyperlipidemia   . Stroke 2002    "w/left side" residual weakness  . Diabetes mellitus     type II  . CHF (congestive heart failure)   . CVA (cerebral infarction)     right brain with minimal left hemiparesis  . Pneumonia 10/2009; 11/2009    ?pneumonia 01/17/11  . Hypertension   . Arthritis   . Complication of anesthesia     "sleepy afterwards; due to her being dialysis pt"  . Myocardial infarction 2002  . ESRD on dialysis     Faith [name]; M, W, F; via right upper arm AVF (01/18/2012)    Past Surgical History  Procedure Laterality Date  . Coronary artery bypass graft  2002  . Laparoscopic cholecystectomy  2002  . Av fistula placement      times 4-using rt upper arm graft-old rt lower arm,lt upper and lower old grafts  . Trigger finger release    . Tubal ligation    . Eye examination under anesthesia w/ retinal cryotherapy and retinal laser    . Cataract extraction  07/2010    right sided   . Toe amputation  5/12    left 2nd toe secondary to osteomyelitis  . Breast biopsy  06/2010  . Carpal tunnel release  05/05/2011    Procedure: CARPAL TUNNEL RELEASE;  Surgeon: Sharma Covert, MD;  Location: [facility];  Service: Orthopedics;  Laterality:  Right;  . Coronary angioplasty with stent placement    . Amputation  11/24/2011    Procedure: AMPUTATION RAY;  Surgeon: Nadara Mustard, MD;  Location: [facility] OR;  Service: Orthopedics;  Laterality: Right;  Right Foot 2nd Ray Amputation  . Amputation  12/12/2011    Procedure: AMPUTATION BELOW KNEE;  Surgeon: Nadara Mustard, MD;  Location: [facility] OR;  Service: Orthopedics;  Laterality: Right;  . 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 Amputation  01/17/2012    Procedure: AMPUTATION ABOVE KNEE;  Surgeon: Nadara Mustard, MD;  Location: [facility] OR;  Service: Orthopedics;  Laterality: Right;  Right Above Knee Amputation  . I&d extremity  02/15/2012    Procedure: IRRIGATION AND DEBRIDEMENT EXTREMITY;  Surgeon: Kathryne Hitch, MD;  Location: [facility] OR;  Service: Orthopedics;  Laterality: Right;    Family History  Problem Relation Age of Onset  . Diabetes Mother   . Cirrhosis Mother   . Diabetes Father   . Heart attack Father   . Hyperlipidemia Father   . Heart disease Father   . Diabetes Sister   . Hyperlipidemia Sister   . Hypertension Sister   . Diabetes Sister   . Diabetes Sister   .  Heart disease Brother   . Hyperlipidemia Brother    Social History:  reports that she has never smoked. She has never used smokeless tobacco. She reports that  drinks alcohol. She reports that she uses illicit drugs (Marijuana).  Allergies:  Allergies  Allergen Reactions  . Lipitor (Atorvastatin Calcium) Other (See Comments)    Muscle weakness  . Codeine Nausea And Vomiting  . Metoclopramide Hcl Other (See Comments)    Tardive dyskinesia  . Iohexol Itching     (Not in a hospital admission)  No results found for this or any previous visit (from the past 48 hour(s)). No results found.  Review of Systems  Constitutional: Negative for fever.  Respiratory: Negative for cough.   Cardiovascular: Negative for chest pain.  Gastrointestinal: Negative for nausea, vomiting and abdominal pain.   Neurological: Positive for weakness.    There were no vitals taken for this visit. Physical Exam  Constitutional: She is oriented to person, place, and time. She appears well-nourished.  Cardiovascular: Normal rate, regular rhythm and normal heart sounds.   Respiratory: Effort normal and breath sounds normal.  GI: Soft. There is no tenderness.  Musculoskeletal: Normal range of motion.  R AKA  Neurological: She is alert and oriented to person, place, and time.  Psychiatric: She has a normal mood and affect. Her behavior is normal. Judgment and thought content normal.     Assessment/Plan Rt arm dialysis graft stenosis Scheduled for pta/stent Pt aware of procedure benefits and risks and agreeable to proceed Consent signed.  Rosiland Sen A 08/07/2012, 1:10 PM

## 2012-08-08 ENCOUNTER — Telehealth (HOSPITAL_COMMUNITY): Payer: Self-pay | Admitting: *Deleted

## 2012-08-08 NOTE — Telephone Encounter (Signed)
Post procedure follow up call.  Spoke with pt.  Says doing well.  Denies, questions, concerns or comments.  Says care received with great.

## 2013-01-01 ENCOUNTER — Encounter: Payer: Self-pay | Admitting: Gynecology

## 2013-01-01 ENCOUNTER — Telehealth: Payer: Self-pay | Admitting: *Deleted

## 2013-01-01 ENCOUNTER — Other Ambulatory Visit (HOSPITAL_COMMUNITY)
Admission: RE | Admit: 2013-01-01 | Discharge: 2013-01-01 | Disposition: A | Discharge status: Home / Self Care | Payer: Medicare (Managed Care) | Source: Ambulatory Visit | Attending: Gynecology | Admitting: Gynecology

## 2013-01-01 ENCOUNTER — Ambulatory Visit (INDEPENDENT_AMBULATORY_CARE_PROVIDER_SITE_OTHER): Payer: PRIVATE HEALTH INSURANCE | Admitting: Gynecology

## 2013-01-01 VITALS — BP 136/84

## 2013-01-01 DIAGNOSIS — Z01419 Encounter for gynecological examination (general) (routine) without abnormal findings: Secondary | ICD-10-CM

## 2013-01-01 DIAGNOSIS — Z780 Asymptomatic menopausal state: Secondary | ICD-10-CM

## 2013-01-01 DIAGNOSIS — N63 Unspecified lump in unspecified breast: Secondary | ICD-10-CM

## 2013-01-01 DIAGNOSIS — Z124 Encounter for screening for malignant neoplasm of cervix: Secondary | ICD-10-CM | POA: Insufficient documentation

## 2013-01-01 DIAGNOSIS — Z1151 Encounter for screening for human papillomavirus (HPV): Secondary | ICD-10-CM | POA: Insufficient documentation

## 2013-01-01 DIAGNOSIS — N951 Menopausal and female climacteric states: Secondary | ICD-10-CM

## 2013-01-01 DIAGNOSIS — N6310 Unspecified lump in the right breast, unspecified quadrant: Secondary | ICD-10-CM

## 2013-01-01 NOTE — Addendum Note (Signed)
Addended by: Bertram Savin A on: 01/01/2013 12:43 PM   Modules accepted: Orders

## 2013-01-01 NOTE — Patient Instructions (Addendum)
Shingles Vaccine  What You Need to Know  WHAT IS SHINGLES?  · Shingles is a painful skin rash, often with blisters. It is also called Herpes Zoster or just Zoster.  · A shingles rash usually appears on one side of the face or body and lasts from 2 to 4 weeks. Its main symptom is pain, which can be quite severe. Other symptoms of shingles can include fever, headache, chills, and upset stomach. Very rarely, a shingles infection can lead to pneumonia, hearing problems, blindness, brain inflammation (encephalitis), or death.  · For about 1 person in 5, severe pain can continue even after the rash clears up. This is called post-herpetic neuralgia.  · Shingles is caused by the Varicella Zoster virus. This is the same virus that causes chickenpox. Only someone who has had a case of chickenpox or rarely, has gotten chickenpox vaccine, can get shingles. The virus stays in your body. It can reappear many years later to cause a case of shingles.  · You cannot catch shingles from another person with shingles. However, a person who has never had chickenpox (or chickenpox vaccine) could get chickenpox from someone with shingles. This is not very common.  · Shingles is far more common in people 50 and older than in younger people. It is also more common in people whose immune systems are weakened because of a disease such as cancer or drugs such as steroids or chemotherapy.  · At least 1 million people get shingles per year in the [place].  SHINGLES VACCINE  · A vaccine for shingles was licensed in 2006. In clinical trials, the vaccine reduced the risk of shingles by 50%. It can also reduce the pain in people who still get shingles after being vaccinated.  · A single dose of shingles vaccine is recommended for adults 60 years of age and older.  SOME PEOPLE SHOULD NOT GET SHINGLES VACCINE OR SHOULD WAIT  A person should not get shingles vaccine if he or she:  · Has ever had a life-threatening allergic reaction to gelatin, the  antibiotic neomycin, or any other component of shingles vaccine. Tell your caregiver if you have any severe allergies.  · Has a weakened immune system because of current:  · AIDS or another disease that affects the immune system.  · Treatment with drugs that affect the immune system, such as prolonged use of high-dose steroids.  · Cancer treatment, such as radiation or chemotherapy.  · Cancer affecting the bone marrow or lymphatic system, such as leukemia or lymphoma.  · Is pregnant, or might be pregnant. Women should not become pregnant until at least 4 weeks after getting shingles vaccine.  Someone with a minor illness, such as a cold, may be vaccinated. Anyone with a moderate or severe acute illness should usually wait until he or she recovers before getting the vaccine. This includes anyone with a temperature of 101.3° F (38° C) or higher.  WHAT ARE THE RISKS FROM SHINGLES VACCINE?  · A vaccine, like any medicine, could possibly cause serious problems, such as severe allergic reactions. However, the risk of a vaccine causing serious harm, or death, is extremely small.  · No serious problems have been identified with shingles vaccine.  Mild Problems  · Redness, soreness, swelling, or itching at the site of the injection (about 1 person in 3).  · Headache (about 1 person in 70).  Like all vaccines, shingles vaccine is being closely monitored for unusual or severe problems.  WHAT IF   THERE IS A MODERATE OR SEVERE REACTION?  What should I look for?  Any unusual condition, such as a severe allergic reaction or a high fever. If a severe allergic reaction occurred, it would be within a few minutes to an hour after the shot. Signs of a serious allergic reaction can include difficulty breathing, weakness, hoarseness or wheezing, a fast heartbeat, hives, dizziness, paleness, or swelling of the throat.  What should I do?  · Call your caregiver, or get the person to a caregiver right away.  · Tell the caregiver what  happened, the date and time it happened, and when the vaccination was given.  · Ask the caregiver to report the reaction by filing a Vaccine Adverse Event Reporting System (VAERS) form. Or, you can file this report through the VAERS web site at www.vaers.hhs.gov or by calling 1-800-822-7967.  VAERS does not provide medical advice.  HOW CAN I LEARN MORE?  · Ask your caregiver. He or she can give you the vaccine package insert or suggest other sources of information.  · Contact the Centers for Disease Control and Prevention (CDC):  · Call 1-800-232-4636 (1-800-CDC-INFO).  · Visit the CDC website at www.cdc.gov/vaccines  CDC Shingles Vaccine VIS (11/27/07)  Document Released: 12/05/2005 Document Revised: 05/02/2011 Document Reviewed: 05/30/2012  Exit[employer]® Patient Information ©2014 [employer].

## 2013-01-01 NOTE — Telephone Encounter (Signed)
Orders placed for breast center, they will contact pt with time and date.

## 2013-01-01 NOTE — Progress Notes (Signed)
Faith [name] 01-19-1956 [id number]   History:    57 y.o.  for annual gyn exam Who is new to the practice and has not had a Pap smear or gynecological exam in over 3 years. Patient with multiple comorbidities please see problem list in Epic as well as medication list. Her PCP is Dr. Laurene Footman who has been following her and doing her blood work as well. Patient has not had a mammogram since 2012. Patient does not recall ever having had a bone density study. She has received the flu vaccine and not certain about the shingles vaccines. Her colonoscopy was approximately 10 years ago according to patient's history. The patient not certain if she ever had any abnormal Pap smears in the past. Patient with end-stage renal disease on dialysis who also has anemia and secondary hyperparathyroidism as well as type 2 diabetes and history of coronary artery disease, hypertension hyperlipidemia. She has a history of right AKA and has history of venous insufficiency  Past medical history,surgical history, family history and social history were all reviewed and documented in the EPIC chart.  Gynecologic History No LMP recorded. Patient is postmenopausal. Contraception: post menopausal status Last Pap: over 3 years ago. Results were: normal Last mammogram: 2012. Results were: normal  Obstetric History OB History  Gravida Para Term Preterm AB SAB TAB Ectopic Multiple Living  5 1   4 4    1     # Outcome Date GA Lbr Len/2nd Weight Sex Delivery Anes PTL Lv  5 SAB           4 SAB           3 SAB           2 SAB           1 PAR                ROS: A ROS was performed and pertinent positives and negatives are included in the history.  GENERAL: No fevers or chills. HEENT: No change in vision, no earache, sore throat or sinus congestion. NECK: No pain or stiffness. CARDIOVASCULAR: No chest pain or pressure. No palpitations. PULMONARY: No shortness of breath, cough or wheeze. GASTROINTESTINAL: No abdominal pain,  nausea, vomiting or diarrhea, melena or bright red blood per rectum. GENITOURINARY: No urinary frequency, urgency, hesitancy or dysuria. MUSCULOSKELETAL: No joint or muscle pain, no back pain, no recent trauma. DERMATOLOGIC: No rash, no itching, no lesions. ENDOCRINE: No polyuria, polydipsia, no heat or cold intolerance. No recent change in weight. HEMATOLOGICAL: No anemia or easy bruising or bleeding. NEUROLOGIC: No headache, seizures, numbness, tingling or weakness. PSYCHIATRIC: No depression, no loss of interest in normal activity or change in sleep pattern.     Exam: chaperone present  BP 136/84  There is no weight on file to calculate BMI.  General appearance : Well developed well nourished female. No acute distress HEENT: Neck supple, trachea midline, no carotid bruits, no thyroidmegaly Lungs: Clear to auscultation, no rhonchi or wheezes, or rib retractions  Heart: Regular rate and rhythm, no murmurs or gallops Breast:Examined in sitting and supine position were symmetrical in appearance, right breast mass 12'oclock position periareolar region 2x2 cm Abdomen: no palpable masses or tenderness, no rebound or guarding Extremities: no edema or skin discoloration or tenderness, above-knee amputation right  Pelvic:  Bartholin, Urethra, Skene Glands: Within normal limits             Vagina: No gross lesions or discharge  Cervix: No  gross lesions or discharge  Uterus  axial, normal size, shape and consistency, non-tender and mobile  Adnexa  Without masses or tenderness  Anus and perineum  normal   Rectovaginal  normal sphincter tone without palpated masses or tenderness             Hemoccult PCP provides     Assessment/Plan:  57 y.o. female for annual exam with no gynecological examination over 3 years. Pap smear was done today. Patient was found to have a 2 x 2 centimeter firm mass on the right breast 12:00 position periareolar region. Will discuss with her PCP about scheduling a bone  density study? She will check with her PCP in reference to the shingles vaccine. We will schedule a diagnostic mammogram and ultrasound of the right breast a screening mammogram on the left.  Note: This dictation was prepared with  Dragon/digital dictation along withSmart phrase technology. Any transcriptional errors that result from this process are unintentional.   Ok Edwards MD, 12:33 PM 01/01/2013

## 2013-01-01 NOTE — Telephone Encounter (Signed)
Message copied by Aura Camps on Tue Jan 01, 2013  4:04 PM ------      Message from: Ok Edwards      Created: Tue Jan 01, 2013 12:31 PM       Victorino Dike please schedule diagnostic mammogram/ ultrasound on this patient with right breast mass 12'oclock position periareolar region 2x2 cm. Thanks ------

## 2013-01-03 NOTE — Telephone Encounter (Signed)
appt 01/22/13

## 2013-01-22 ENCOUNTER — Ambulatory Visit
Admission: RE | Admit: 2013-01-22 | Discharge: 2013-01-22 | Disposition: A | Discharge status: Home / Self Care | Payer: Medicare (Managed Care) | Source: Ambulatory Visit | Attending: Gynecology | Admitting: Gynecology

## 2013-01-22 DIAGNOSIS — N6310 Unspecified lump in the right breast, unspecified quadrant: Secondary | ICD-10-CM

## 2013-03-22 ENCOUNTER — Other Ambulatory Visit: Payer: Self-pay | Admitting: *Deleted

## 2013-03-22 DIAGNOSIS — T82598A Other mechanical complication of other cardiac and vascular devices and implants, initial encounter: Secondary | ICD-10-CM

## 2013-03-25 ENCOUNTER — Other Ambulatory Visit (HOSPITAL_COMMUNITY): Payer: Self-pay | Admitting: Family Medicine

## 2013-03-25 DIAGNOSIS — N186 End stage renal disease: Secondary | ICD-10-CM

## 2013-03-28 ENCOUNTER — Inpatient Hospital Stay (HOSPITAL_COMMUNITY)
Admission: RE | Admit: 2013-03-28 | Discharge: 2013-03-28 | Disposition: A | Discharge status: Home / Self Care | Payer: Self-pay | Source: Ambulatory Visit | Attending: Family Medicine | Admitting: Family Medicine

## 2013-04-01 ENCOUNTER — Other Ambulatory Visit (HOSPITAL_COMMUNITY): Payer: Self-pay

## 2013-04-01 ENCOUNTER — Other Ambulatory Visit: Payer: Self-pay | Admitting: Radiology

## 2013-04-01 ENCOUNTER — Emergency Department (HOSPITAL_COMMUNITY)
Admission: EM | Admit: 2013-04-01 | Discharge: 2013-04-01 | Disposition: A | Discharge status: Home / Self Care | Payer: Medicare (Managed Care) | Source: Home / Self Care | Attending: Emergency Medicine | Admitting: Emergency Medicine

## 2013-04-01 ENCOUNTER — Ambulatory Visit: Payer: Self-pay | Admitting: Surgery

## 2013-04-01 ENCOUNTER — Encounter (HOSPITAL_COMMUNITY): Payer: Self-pay | Admitting: Emergency Medicine

## 2013-04-01 DIAGNOSIS — Z862 Personal history of diseases of the blood and blood-forming organs and certain disorders involving the immune mechanism: Secondary | ICD-10-CM | POA: Insufficient documentation

## 2013-04-01 DIAGNOSIS — I120 Hypertensive chronic kidney disease with stage 5 chronic kidney disease or end stage renal disease: Secondary | ICD-10-CM

## 2013-04-01 DIAGNOSIS — N186 End stage renal disease: Secondary | ICD-10-CM

## 2013-04-01 DIAGNOSIS — I252 Old myocardial infarction: Secondary | ICD-10-CM | POA: Insufficient documentation

## 2013-04-01 DIAGNOSIS — Z8701 Personal history of pneumonia (recurrent): Secondary | ICD-10-CM

## 2013-04-01 DIAGNOSIS — Z9861 Coronary angioplasty status: Secondary | ICD-10-CM | POA: Insufficient documentation

## 2013-04-01 DIAGNOSIS — I2510 Atherosclerotic heart disease of native coronary artery without angina pectoris: Secondary | ICD-10-CM | POA: Insufficient documentation

## 2013-04-01 DIAGNOSIS — G589 Mononeuropathy, unspecified: Secondary | ICD-10-CM | POA: Insufficient documentation

## 2013-04-01 DIAGNOSIS — IMO0002 Reserved for concepts with insufficient information to code with codable children: Secondary | ICD-10-CM

## 2013-04-01 DIAGNOSIS — Z8659 Personal history of other mental and behavioral disorders: Secondary | ICD-10-CM

## 2013-04-01 DIAGNOSIS — Z951 Presence of aortocoronary bypass graft: Secondary | ICD-10-CM | POA: Insufficient documentation

## 2013-04-01 DIAGNOSIS — Z8719 Personal history of other diseases of the digestive system: Secondary | ICD-10-CM

## 2013-04-01 DIAGNOSIS — E119 Type 2 diabetes mellitus without complications: Secondary | ICD-10-CM

## 2013-04-01 DIAGNOSIS — Z8673 Personal history of transient ischemic attack (TIA), and cerebral infarction without residual deficits: Secondary | ICD-10-CM

## 2013-04-01 DIAGNOSIS — Z79899 Other long term (current) drug therapy: Secondary | ICD-10-CM

## 2013-04-01 DIAGNOSIS — Z86711 Personal history of pulmonary embolism: Secondary | ICD-10-CM | POA: Insufficient documentation

## 2013-04-01 DIAGNOSIS — Z992 Dependence on renal dialysis: Secondary | ICD-10-CM | POA: Insufficient documentation

## 2013-04-01 DIAGNOSIS — T82598A Other mechanical complication of other cardiac and vascular devices and implants, initial encounter: Secondary | ICD-10-CM | POA: Insufficient documentation

## 2013-04-01 DIAGNOSIS — Z8739 Personal history of other diseases of the musculoskeletal system and connective tissue: Secondary | ICD-10-CM | POA: Insufficient documentation

## 2013-04-01 DIAGNOSIS — N2581 Secondary hyperparathyroidism of renal origin: Secondary | ICD-10-CM

## 2013-04-01 DIAGNOSIS — I5032 Chronic diastolic (congestive) heart failure: Secondary | ICD-10-CM

## 2013-04-01 DIAGNOSIS — Y832 Surgical operation with anastomosis, bypass or graft as the cause of abnormal reaction of the patient, or of later complication, without mention of misadventure at the time of the procedure: Secondary | ICD-10-CM

## 2013-04-01 DIAGNOSIS — T829XXA Unspecified complication of cardiac and vascular prosthetic device, implant and graft, initial encounter: Secondary | ICD-10-CM

## 2013-04-01 NOTE — ED Notes (Signed)
PA at bedside.

## 2013-04-01 NOTE — ED Notes (Signed)
Per EMS pt noticed dialysis graft in Rt arm started bleeding; pt possible loss about 50 cc blood; no pain; Hx: CVA, MI, DM; due for 6 am dialysis; bleeding stopped before arrival.

## 2013-04-01 NOTE — ED Provider Notes (Signed)
CSN: [id number]     Arrival date & time 04/01/13  0507 History   First MD Initiated Contact with Patient 04/01/13 [phone]     Chief Complaint  Patient presents with  . Vascular Access Problem   (Consider location/radiation/quality/duration/timing/severity/associated sxs/prior Treatment) The history is provided by the patient and medical records.   This is a 58 year old female past medical history significant for hypertension, hyperlipidemia, diabetes, CHF, CAD, end-stage renal disease currently on dialysis, presenting to the ED via EMS for bleeding from her right arm dialysis graft. Patient states after her last dialysis treatment she had a little bit of residual bleeding, however this stopped prior to leaving her appointment. She woke up in the middle of the night last night and had bleeding from her right dialysis graft. She states it was a small pool of blood in her bed.  Bleeding stopped prior to arrival but has started again in the ED. Patient denies  weakness, dizziness, lightheadedness, or feelings of syncope.  Pt has dialysis appt today scheduled for 0615-- she called facility and notified that she was in the ED.  VS stable on arrival.  Past Medical History  Diagnosis Date  . Hyperparathyroidism, secondary renal   . CAD (coronary artery disease) of artery bypass graft     CABG in 2002  . PAD (peripheral artery disease)     Not a candidate for revascularization  . Chronic constipation   . GERD (gastroesophageal reflux disease)   . Anemia of chronic renal failure   . Tardive dyskinesia     Secondary to Reglan  . Depression   . Neuropathy   . Diabetic retinopathy   . Diastolic CHF, chronic     Grade 2  . Pulmonary embolism 06/2003  . Hyperlipidemia   . Stroke 2002    "w/left side" residual weakness  . Diabetes mellitus     type II  . CHF (congestive heart failure)   . CVA (cerebral infarction)     right brain with minimal left hemiparesis  . Pneumonia 10/2009; 11/2009     ?pneumonia 01/17/11  . Hypertension   . Arthritis   . Complication of anesthesia     "sleepy afterwards; due to her being dialysis pt"  . Myocardial infarction 2002  . ESRD on dialysis     [name]; M, W, F; via right upper arm AVF (01/18/2012)   Past Surgical History  Procedure Laterality Date  . Coronary artery bypass graft  2002  . Laparoscopic cholecystectomy  2002  . Av fistula placement      times 4-using rt upper arm graft-old rt lower arm,lt upper and lower old grafts  . Trigger finger release    . Tubal ligation    . Eye examination under anesthesia w/ retinal cryotherapy and retinal laser    . Cataract extraction  07/2010    right sided   . Toe amputation  5/12    left 2nd toe secondary to osteomyelitis  . Breast biopsy  06/2010  . Carpal tunnel release  05/05/2011    Procedure: CARPAL TUNNEL RELEASE;  Surgeon: Sharma Covert, MD;  Location: [facility];  Service: Orthopedics;  Laterality: Right;  . Coronary angioplasty with stent placement    . Amputation  11/24/2011    Procedure: AMPUTATION RAY;  Surgeon: Nadara Mustard, MD;  Location: [facility] OR;  Service: Orthopedics;  Laterality: Right;  Right Foot 2nd Ray Amputation  . Amputation  12/12/2011    Procedure: AMPUTATION BELOW KNEE;  Surgeon: Nadara MustardMarcus V Duda, MD;  [facility];  Service: Orthopedics;  Laterality: Right;  . 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 Amputation  01/17/2012    Procedure: AMPUTATION ABOVE KNEE;  Surgeon: Nadara MustardMarcus V Duda, MD;  Location: [facility] OR;  Service: Orthopedics;  Laterality: Right;  Right Above Knee Amputation  . I&d extremity  02/15/2012    Procedure: IRRIGATION AND DEBRIDEMENT EXTREMITY;  Surgeon: Kathryne Hitchhristopher Y Blackman, MD;  [facility];  Service: Orthopedics;  Laterality: Right;   Family History  Problem Relation Age of Onset  . Diabetes Mother   . Cirrhosis Mother   . Diabetes Father   . Heart attack Father   . Hyperlipidemia Father   . Heart disease Father    . Diabetes Sister   . Hyperlipidemia Sister   . Hypertension Sister   . Diabetes Sister   . Diabetes Sister   . Heart disease Brother   . Hyperlipidemia Brother    History  Substance Use Topics  . Smoking status: Never Smoker   . Smokeless tobacco: Never Used     Comment: 01/18/2012 "quit smoking  marijuana~ 15 yr ago"  . Alcohol Use: No     Comment: 01/18/2012 "occasionally I'll have a mixed drink"   OB History   Grav Para Term Preterm Abortions TAB SAB Ect Mult Living   5 1   4  4   1      Review of Systems  Hematological:       Bleeding dialysis graft  All other systems reviewed and are negative.    Allergies  Lipitor; Codeine; Metoclopramide hcl; and Iohexol  Home Medications   Current Outpatient Rx  Name  Route  Sig  Dispense  Refill  . cinacalcet (SENSIPAR) 30 MG tablet   Oral   Take 30 mg by mouth daily.          . cinacalcet (SENSIPAR) 90 MG tablet   Oral   Take 90 mg by mouth every evening.         Marland Kitchen. doxycycline (ADOXA) 100 MG tablet   Oral   Take 100 mg by mouth daily. For 30 days. Stop date 04/07/12         . fluticasone (FLONASE) 50 MCG/ACT nasal spray   Nasal   Place 2 sprays into the nose 2 (two) times daily.         Marland Kitchen. gabapentin (NEURONTIN) 400 MG capsule   Oral   Take 400 mg by mouth at bedtime.         . multivitamin (RENA-VIT) TABS tablet   Oral   Take 1 tablet by mouth daily.          . nitroGLYCERIN (NITROSTAT) 0.4 MG SL tablet   Sublingual   Place 0.4 mg under the tongue every 5 (five) minutes as needed. For chest pain         . pantoprazole (PROTONIX) 40 MG tablet   Oral   Take 40 mg by mouth at bedtime.          . polyvinyl alcohol (LIQUIFILM TEARS) 1.4 % ophthalmic solution   Both Eyes   Place 1 drop into both eyes daily as needed. For dryness         . sevelamer (RENAGEL) 800 MG tablet   Oral   Take 2-4 tablets (1,600-3,200 mg total) by mouth See admin instructions. Take 4 tablets with meals and 2  tablets with snacks         .  sodium chloride (OCEAN) 0.65 % nasal spray   Nasal   Place 2 sprays into the nose 4 (four) times daily as needed. For nasal congestion         . oxyCODONE (OXY IR/ROXICODONE) 5 MG immediate release tablet   Oral   Take 5-10 mg by mouth every 4 (four) hours as needed. For pain          BP 130/62  Pulse 64  Temp(Src) 97.5 F (36.4 C) (Oral)  Resp 18  SpO2 96%  Physical Exam  Nursing note and vitals reviewed. Constitutional: She is oriented to person, place, and time. She appears well-developed and well-nourished.  HENT:  Head: Normocephalic and atraumatic.  Mouth/Throat: Oropharynx is clear and moist.  Eyes: Conjunctivae and EOM are normal. Pupils are equal, round, and reactive to light.  Neck: Normal range of motion.  Cardiovascular: Normal rate, regular rhythm and normal heart sounds.   Pulmonary/Chest: Effort normal and breath sounds normal.  Musculoskeletal: Normal range of motion.  RUE dialysis graft, blood spewing when dressing removed, thrill present 2 non-functioning dialysis grafts in left upper arm and forearm  Neurological: She is alert and oriented to person, place, and time.  Skin: Skin is warm and dry.  Psychiatric: She has a normal mood and affect.     ED Course  Procedures (including critical care time) Labs Review Labs Reviewed - No data to display Imaging Review No results found.  EKG Interpretation   None       MDM   1. Renal dialysis device, implant, or graft complication    Quick clot gauze and pressure dressing applied.  Will re-check.  7:48 AM Pressure dressing removed, no active bleeding.  Will leave uncovered and monitor for recurrent bleeding.  8:18 AM Graft left uncovered for 30 minutes without recurrent bleeding.  Thrill remains present.  Contacted dialysis center, ok to send over for tx.  New dressing applied, pt will be discharged.  Garlon Hatchet, PA-C 04/01/13 [phone]

## 2013-04-01 NOTE — ED Provider Notes (Signed)
Medical screening examination/treatment/procedure(s) were performed by non-physician practitioner and as supervising physician I was immediately available for consultation/collaboration.  EKG Interpretation   None        Jimmylee Ratterree K Thoma Paulsen-Rasch, MD 04/01/13 [phone]

## 2013-04-01 NOTE — Discharge Instructions (Signed)
Go directly to you dialysis center-- i have confirmed they have a seat available for you. Follow up with your primary care physician as needed. Return to the ED for new or worsening symptoms.

## 2013-04-02 ENCOUNTER — Ambulatory Visit (HOSPITAL_COMMUNITY): Payer: Medicare (Managed Care)

## 2013-04-02 ENCOUNTER — Other Ambulatory Visit (HOSPITAL_COMMUNITY): Payer: Self-pay | Admitting: Family Medicine

## 2013-04-02 ENCOUNTER — Encounter (HOSPITAL_COMMUNITY)
Admission: AD | Disposition: A | Discharge status: Home / Self Care | Payer: Self-pay | Source: Ambulatory Visit | Attending: Vascular Surgery

## 2013-04-02 ENCOUNTER — Ambulatory Visit (HOSPITAL_COMMUNITY)
Admission: RE | Admit: 2013-04-02 | Discharge: 2013-04-02 | Disposition: A | Discharge status: Home / Self Care | Payer: Medicare (Managed Care) | Source: Ambulatory Visit | Attending: Family Medicine | Admitting: Family Medicine

## 2013-04-02 ENCOUNTER — Ambulatory Visit (HOSPITAL_COMMUNITY): Payer: Medicare (Managed Care) | Admitting: Anesthesiology

## 2013-04-02 ENCOUNTER — Encounter (HOSPITAL_COMMUNITY): Payer: Self-pay | Admitting: *Deleted

## 2013-04-02 ENCOUNTER — Encounter (HOSPITAL_COMMUNITY): Payer: Medicare (Managed Care) | Admitting: Anesthesiology

## 2013-04-02 ENCOUNTER — Ambulatory Visit (HOSPITAL_COMMUNITY)
Admission: AD | Admit: 2013-04-02 | Discharge: 2013-04-02 | Disposition: A | Discharge status: Home / Self Care | Payer: Medicare (Managed Care) | Source: Ambulatory Visit | Attending: Vascular Surgery | Admitting: Vascular Surgery

## 2013-04-02 DIAGNOSIS — Z951 Presence of aortocoronary bypass graft: Secondary | ICD-10-CM | POA: Insufficient documentation

## 2013-04-02 DIAGNOSIS — I120 Hypertensive chronic kidney disease with stage 5 chronic kidney disease or end stage renal disease: Secondary | ICD-10-CM | POA: Insufficient documentation

## 2013-04-02 DIAGNOSIS — Z86711 Personal history of pulmonary embolism: Secondary | ICD-10-CM | POA: Insufficient documentation

## 2013-04-02 DIAGNOSIS — E213 Hyperparathyroidism, unspecified: Secondary | ICD-10-CM | POA: Insufficient documentation

## 2013-04-02 DIAGNOSIS — I739 Peripheral vascular disease, unspecified: Secondary | ICD-10-CM | POA: Insufficient documentation

## 2013-04-02 DIAGNOSIS — F329 Major depressive disorder, single episode, unspecified: Secondary | ICD-10-CM | POA: Insufficient documentation

## 2013-04-02 DIAGNOSIS — F3289 Other specified depressive episodes: Secondary | ICD-10-CM | POA: Insufficient documentation

## 2013-04-02 DIAGNOSIS — T82898A Other specified complication of vascular prosthetic devices, implants and grafts, initial encounter: Secondary | ICD-10-CM

## 2013-04-02 DIAGNOSIS — I252 Old myocardial infarction: Secondary | ICD-10-CM | POA: Insufficient documentation

## 2013-04-02 DIAGNOSIS — I69959 Hemiplegia and hemiparesis following unspecified cerebrovascular disease affecting unspecified side: Secondary | ICD-10-CM | POA: Insufficient documentation

## 2013-04-02 DIAGNOSIS — E785 Hyperlipidemia, unspecified: Secondary | ICD-10-CM | POA: Insufficient documentation

## 2013-04-02 DIAGNOSIS — Y849 Medical procedure, unspecified as the cause of abnormal reaction of the patient, or of later complication, without mention of misadventure at the time of the procedure: Secondary | ICD-10-CM | POA: Insufficient documentation

## 2013-04-02 DIAGNOSIS — Z992 Dependence on renal dialysis: Secondary | ICD-10-CM | POA: Insufficient documentation

## 2013-04-02 DIAGNOSIS — I509 Heart failure, unspecified: Secondary | ICD-10-CM | POA: Insufficient documentation

## 2013-04-02 DIAGNOSIS — E1139 Type 2 diabetes mellitus with other diabetic ophthalmic complication: Secondary | ICD-10-CM | POA: Insufficient documentation

## 2013-04-02 DIAGNOSIS — K219 Gastro-esophageal reflux disease without esophagitis: Secondary | ICD-10-CM | POA: Insufficient documentation

## 2013-04-02 DIAGNOSIS — I2510 Atherosclerotic heart disease of native coronary artery without angina pectoris: Secondary | ICD-10-CM | POA: Insufficient documentation

## 2013-04-02 DIAGNOSIS — E11319 Type 2 diabetes mellitus with unspecified diabetic retinopathy without macular edema: Secondary | ICD-10-CM | POA: Insufficient documentation

## 2013-04-02 DIAGNOSIS — G589 Mononeuropathy, unspecified: Secondary | ICD-10-CM | POA: Insufficient documentation

## 2013-04-02 DIAGNOSIS — Z794 Long term (current) use of insulin: Secondary | ICD-10-CM | POA: Insufficient documentation

## 2013-04-02 DIAGNOSIS — N186 End stage renal disease: Secondary | ICD-10-CM

## 2013-04-02 HISTORY — PX: INSERTION OF DIALYSIS CATHETER: SHX1324

## 2013-04-02 HISTORY — PX: THROMBECTOMY AND REVISION OF ARTERIOVENTOUS (AV) GORETEX  GRAFT: SHX6120

## 2013-04-02 LAB — POCT I-STAT 4, (NA,K, GLUC, HGB,HCT)
Glucose, Bld: 270 mg/dL — ABNORMAL HIGH (ref 70–99)
HCT: 39 % (ref 36.0–46.0)
Hemoglobin: 13.3 g/dL (ref 12.0–15.0)
Potassium: 4.8 mEq/L (ref 3.7–5.3)
Sodium: 137 mEq/L (ref 137–147)

## 2013-04-02 LAB — POTASSIUM: Potassium: 5.2 mEq/L (ref 3.7–5.3)

## 2013-04-02 LAB — GLUCOSE, CAPILLARY: GLUCOSE-CAPILLARY: 182 mg/dL — AB (ref 70–99)

## 2013-04-02 SURGERY — THROMBECTOMY AND REVISION OF ARTERIOVENTOUS (AV) GORETEX  GRAFT
Anesthesia: General | Laterality: Right

## 2013-04-02 MED ORDER — HEPARIN SODIUM (PORCINE) 1000 UNIT/ML IJ SOLN
INTRAMUSCULAR | Status: AC
  Filled 2013-04-02: qty 1

## 2013-04-02 MED ORDER — ONDANSETRON HCL 4 MG/2ML IJ SOLN
INTRAMUSCULAR | Status: AC
  Filled 2013-04-02: qty 2

## 2013-04-02 MED ORDER — OXYCODONE HCL 5 MG PO TABS: 5.0000 mg | ORAL_TABLET | ORAL | Status: DC | PRN

## 2013-04-02 MED ORDER — PROPOFOL 10 MG/ML IV BOLUS
INTRAVENOUS | Status: DC | PRN
  Administered 2013-04-02: 200 mg via INTRAVENOUS

## 2013-04-02 MED ORDER — SODIUM CHLORIDE 0.9 % IR SOLN
Status: DC | PRN
  Administered 2013-04-02: 13:00:00

## 2013-04-02 MED ORDER — FENTANYL CITRATE 0.05 MG/ML IJ SOLN
INTRAMUSCULAR | Status: AC | PRN
  Administered 2013-04-02 (×2): 25 ug via INTRAVENOUS

## 2013-04-02 MED ORDER — MIDAZOLAM HCL 5 MG/5ML IJ SOLN
INTRAMUSCULAR | Status: DC | PRN
  Administered 2013-04-02 (×2): 1 mg via INTRAVENOUS

## 2013-04-02 MED ORDER — SURGIFOAM 100 EX MISC
CUTANEOUS | Status: DC | PRN
  Administered 2013-04-02: 14:00:00 via TOPICAL

## 2013-04-02 MED ORDER — DIPHENHYDRAMINE HCL 50 MG/ML IJ SOLN
INTRAMUSCULAR | Status: AC
  Administered 2013-04-02: 25 mg via INTRAVENOUS
  Filled 2013-04-02: qty 1

## 2013-04-02 MED ORDER — HEPARIN SODIUM (PORCINE) 1000 UNIT/ML IJ SOLN
INTRAMUSCULAR | Status: DC | PRN
  Administered 2013-04-02: 1000 [IU]

## 2013-04-02 MED ORDER — PROTAMINE SULFATE 10 MG/ML IV SOLN
INTRAVENOUS | Status: DC | PRN
  Administered 2013-04-02: 70 mg via INTRAVENOUS

## 2013-04-02 MED ORDER — FENTANYL CITRATE 0.05 MG/ML IJ SOLN
INTRAMUSCULAR | Status: DC | PRN
  Administered 2013-04-02 (×2): 50 ug via INTRAVENOUS

## 2013-04-02 MED ORDER — ONDANSETRON HCL 4 MG/2ML IJ SOLN
4.0000 mg | Freq: Once | INTRAMUSCULAR | Status: AC
  Administered 2013-04-02: 4 mg via INTRAVENOUS

## 2013-04-02 MED ORDER — FENTANYL CITRATE 0.05 MG/ML IJ SOLN
INTRAMUSCULAR | Status: AC
  Filled 2013-04-02: qty 2

## 2013-04-02 MED ORDER — CEFAZOLIN SODIUM-DEXTROSE 2-3 GM-% IV SOLR
INTRAVENOUS | Status: DC | PRN
  Administered 2013-04-02: 2 g via INTRAVENOUS

## 2013-04-02 MED ORDER — PROPOFOL 10 MG/ML IV BOLUS
INTRAVENOUS | Status: AC
  Filled 2013-04-02: qty 20

## 2013-04-02 MED ORDER — PROTAMINE SULFATE 10 MG/ML IV SOLN
INTRAVENOUS | Status: AC
  Filled 2013-04-02: qty 10

## 2013-04-02 MED ORDER — HEPARIN SODIUM (PORCINE) 1000 UNIT/ML IJ SOLN
INTRAMUSCULAR | Status: DC | PRN
  Administered 2013-04-02: 7000 [IU] via INTRAVENOUS

## 2013-04-02 MED ORDER — LIDOCAINE HCL (CARDIAC) 20 MG/ML IV SOLN
INTRAVENOUS | Status: DC | PRN
  Administered 2013-04-02: 50 mg via INTRAVENOUS

## 2013-04-02 MED ORDER — OXYCODONE HCL 5 MG PO TABS
ORAL_TABLET | ORAL | Status: AC
  Administered 2013-04-02: 5 mg
  Filled 2013-04-02: qty 1

## 2013-04-02 MED ORDER — LIDOCAINE HCL (CARDIAC) 20 MG/ML IV SOLN
INTRAVENOUS | Status: AC
  Filled 2013-04-02: qty 5

## 2013-04-02 MED ORDER — SODIUM CHLORIDE 0.9 % IV SOLN
INTRAVENOUS | Status: DC
  Administered 2013-04-02 (×2): via INTRAVENOUS

## 2013-04-02 MED ORDER — GELATIN ABSORBABLE 12-7 MM EX MISC
CUTANEOUS | Status: AC
  Filled 2013-04-02: qty 1

## 2013-04-02 MED ORDER — IOHEXOL 300 MG/ML  SOLN
100.0000 mL | Freq: Once | INTRAMUSCULAR | Status: AC | PRN
  Administered 2013-04-02: 60 mL via INTRAVENOUS

## 2013-04-02 MED ORDER — CEFAZOLIN SODIUM-DEXTROSE 2-3 GM-% IV SOLR
INTRAVENOUS | Status: AC
  Filled 2013-04-02: qty 50

## 2013-04-02 MED ORDER — MIDAZOLAM HCL 2 MG/2ML IJ SOLN
INTRAMUSCULAR | Status: AC | PRN
  Administered 2013-04-02: 1 mg via INTRAVENOUS

## 2013-04-02 MED ORDER — 0.9 % SODIUM CHLORIDE (POUR BTL) OPTIME
TOPICAL | Status: DC | PRN
  Administered 2013-04-02: 1000 mL

## 2013-04-02 MED ORDER — CEFAZOLIN SODIUM-DEXTROSE 2-3 GM-% IV SOLR
2.0000 g | Freq: Three times a day (TID) | INTRAVENOUS | Status: DC
  Administered 2013-04-02: 2 g via INTRAVENOUS
  Filled 2013-04-02 (×3): qty 50

## 2013-04-02 MED ORDER — THROMBIN 20000 UNITS EX SOLR
CUTANEOUS | Status: AC
  Filled 2013-04-02: qty 20000

## 2013-04-02 MED ORDER — FENTANYL CITRATE 0.05 MG/ML IJ SOLN
INTRAMUSCULAR | Status: AC
  Filled 2013-04-02: qty 5

## 2013-04-02 MED ORDER — ALTEPLASE 100 MG IV SOLR
2.0000 mg | Freq: Once | INTRAVENOUS | Status: DC
  Filled 2013-04-02: qty 2

## 2013-04-02 MED ORDER — MIDAZOLAM HCL 2 MG/2ML IJ SOLN
INTRAMUSCULAR | Status: AC
  Filled 2013-04-02: qty 2

## 2013-04-02 MED ORDER — FENTANYL CITRATE 0.05 MG/ML IJ SOLN: 25.0000 ug | INTRAMUSCULAR | Status: DC | PRN

## 2013-04-02 SURGICAL SUPPLY — 58 items
ARMBAND PINK RESTRICT EXTREMIT (MISCELLANEOUS) ×2 IMPLANT
BANDAGE ELASTIC 4 VELCRO ST LF (GAUZE/BANDAGES/DRESSINGS) ×2 IMPLANT
BANDAGE ELASTIC 6 VELCRO ST LF (GAUZE/BANDAGES/DRESSINGS) ×2 IMPLANT
BNDG GAUZE ELAST 4 BULKY (GAUZE/BANDAGES/DRESSINGS) ×2 IMPLANT
CANISTER SUCTION 2500CC (MISCELLANEOUS) ×2 IMPLANT
CATH EMB 4FR 80CM (CATHETERS) ×4 IMPLANT
CATH EMB 5FR 80CM (CATHETERS) ×4 IMPLANT
CHLORAPREP W/TINT 26ML (MISCELLANEOUS) ×2 IMPLANT
CLIP TI MEDIUM 6 (CLIP) ×2 IMPLANT
CLIP TI WIDE RED SMALL 6 (CLIP) ×2 IMPLANT
COVER PROBE W GEL 5X96 (DRAPES) ×2 IMPLANT
COVER SURGICAL LIGHT HANDLE (MISCELLANEOUS) ×2 IMPLANT
DECANTER SPIKE VIAL GLASS SM (MISCELLANEOUS) ×2 IMPLANT
DERMABOND ADVANCED (GAUZE/BANDAGES/DRESSINGS) ×1
DERMABOND ADVANCED .7 DNX12 (GAUZE/BANDAGES/DRESSINGS) ×1 IMPLANT
DRAPE C-ARM 42X72 X-RAY (DRAPES) ×2 IMPLANT
DRAPE CHEST BREAST 15X10 FENES (DRAPES) ×2 IMPLANT
DRAPE X-RAY CASS 24X20 (DRAPES) IMPLANT
ELECT REM PT RETURN 9FT ADLT (ELECTROSURGICAL) ×2
ELECTRODE REM PT RTRN 9FT ADLT (ELECTROSURGICAL) ×1 IMPLANT
GAUZE SPONGE 4X4 16PLY XRAY LF (GAUZE/BANDAGES/DRESSINGS) ×2 IMPLANT
GEL ULTRASOUND 20GR AQUASONIC (MISCELLANEOUS) IMPLANT
GLOVE BIO SURGEON STRL SZ 6.5 (GLOVE) ×6 IMPLANT
GLOVE BIO SURGEON STRL SZ7.5 (GLOVE) ×4 IMPLANT
GLOVE BIOGEL PI IND STRL 6.5 (GLOVE) ×1 IMPLANT
GLOVE BIOGEL PI IND STRL 7.0 (GLOVE) ×1 IMPLANT
GLOVE BIOGEL PI INDICATOR 6.5 (GLOVE) ×1
GLOVE BIOGEL PI INDICATOR 7.0 (GLOVE) ×1
GOWN STRL REUS W/ TWL LRG LVL3 (GOWN DISPOSABLE) ×3 IMPLANT
GOWN STRL REUS W/ TWL XL LVL3 (GOWN DISPOSABLE) ×1 IMPLANT
GOWN STRL REUS W/TWL LRG LVL3 (GOWN DISPOSABLE) ×3
GOWN STRL REUS W/TWL XL LVL3 (GOWN DISPOSABLE) ×1
GRAFT GORETEX STND 6X20 (Vascular Products) ×2 IMPLANT
GRAFT GORETEXSTD 6X20 (Vascular Products) ×1 IMPLANT
KIT BASIN OR (CUSTOM PROCEDURE TRAY) ×2 IMPLANT
KIT ROOM TURNOVER OR (KITS) ×2 IMPLANT
LOOP VESSEL MINI RED (MISCELLANEOUS) IMPLANT
NEEDLE 18GX1X1/2 (RX/OR ONLY) (NEEDLE) ×2 IMPLANT
NEEDLE HYPO 25GX1X1/2 BEV (NEEDLE) ×2 IMPLANT
NS IRRIG 1000ML POUR BTL (IV SOLUTION) ×2 IMPLANT
PACK CV ACCESS (CUSTOM PROCEDURE TRAY) ×2 IMPLANT
PAD ARMBOARD 7.5X6 YLW CONV (MISCELLANEOUS) ×4 IMPLANT
SET COLLECT BLD 21X3/4 12 (NEEDLE) IMPLANT
SPONGE GAUZE 4X4 12PLY (GAUZE/BANDAGES/DRESSINGS) ×2 IMPLANT
SPONGE SURGIFOAM ABS GEL 100 (HEMOSTASIS) IMPLANT
STOPCOCK 4 WAY LG BORE MALE ST (IV SETS) IMPLANT
SUT ETHILON 3 0 PS 1 (SUTURE) ×2 IMPLANT
SUT PROLENE 6 0 CC (SUTURE) ×10 IMPLANT
SUT VIC AB 3-0 SH 27 (SUTURE) ×2
SUT VIC AB 3-0 SH 27X BRD (SUTURE) ×2 IMPLANT
SUT VICRYL 4-0 PS2 18IN ABS (SUTURE) ×8 IMPLANT
SYR 20CC LL (SYRINGE) ×2 IMPLANT
SYR 5ML LL (SYRINGE) ×2 IMPLANT
TOWEL OR 17X24 6PK STRL BLUE (TOWEL DISPOSABLE) ×2 IMPLANT
TOWEL OR 17X26 10 PK STRL BLUE (TOWEL DISPOSABLE) ×4 IMPLANT
TUBING EXTENTION W/L.L. (IV SETS) IMPLANT
UNDERPAD 30X30 INCONTINENT (UNDERPADS AND DIAPERS) ×2 IMPLANT
WATER STERILE IRR 1000ML POUR (IV SOLUTION) ×2 IMPLANT

## 2013-04-02 NOTE — Op Note (Signed)
Procedure: #1 removal degenerative right upper arm AV graft #2 placement of new right upper arm AV graft #3 ultrasound of neck #4 placement of femoral Diatek catheter  Preoperative diagnosis: #1 thrombosed right upper arm AV graft #2 end-stage renal disease  Postoperative diagnosis: Same  Anesthesia: Gen.  Assistant: Darlin Coco PA-C, Burnard Leigh PA-C student  Operative findings: #1 thrombosed AV graft #2 degenerative central segment AV graft #3 55 cm Diatek catheter femoral vein  Operative details: After obtaining informed consent, the patient was taken the operating room. The patient was placed in supine position on the operating room table. After induction of general anesthesia, the patient's entire right upper extremity was prepped and draped in usual sterile fashion. Next a longitudinal incision was made through a pre-existing scar near the antecubital crease. Incision was carried down to the saphenous tissues down to level her pre-existing AV graft. This was dissected free circumferentially. A vessel loop was placed around this. There was an ulcerated segment in the graft and the proximal third. A transverse incision was made just distal to this. Incision was carried down through subcutaneous tissues to the level of the graft. Graft was dissected free circumferentially in this area. The graft was then transected. The graft was noted to be thrombosed. #4 and 5 Fogarty catheters were used to thrombectomize the venous limb of the graft. However there was severe degeneration of the more distal graft. I did not think this should be left behind and would be prone to clotting. Therefore an additional transverse incision was made over the distal third of the graft and the graft in this area dissected free circumferentially. The graft was then transected at this level. The patient was given 7000 units of intravenous heparin. As much of the degenerated graft as possible was removed from the central portion  of the arm. The area of ulceration was debrided back to healthy skin. A new 6 mm PTFE graft was tunneled subcutaneously from the antecubital incision up to the distal third of the arm. The venous limb of the graft was thrombectomized and there was excellent venous back bleeding. The proximal segment of degenerated graft near the arterial limb was also transected and the arterial limb thrombectomized and there was good arterial inflow. The graft was clamped proximally and distally fistula clamps. The graft was then cut to length and sewn end-to-end first to the venous segment and an end-to-end to the arterial segment using a running 6-0 Prolene suture. Just prior completion of the anastomoses. Everything was forebled backbled and thoroughly flushed. The anastomoses were secured and the clamps released. There was good flow through the graft immediately. There was an audible radial Doppler signal. Hemostasis was obtained with thrombin and Gelfoam and administration of 70 mg of protamine. The subcutaneous tissues of the proximal and distal incisions over the anastomoses were closed with running 30 4 Vicryl subcuticular stitch and the skin. The areas that had been opened in the central portion over the old graft were closed with interrupted 3-0 nylon vertical mattress and simple sutures. A dry sterile dressing was applied.   Attention was then turned to placing a Diatek catheter since they did not have enough graft remaining for cannulation at this point.  The patient had a recent central vein procedure on the right side today.  The left and right jugular veins were small on ultrasound.  Both groins were prepped and draped in usual sterile fashion. The patient was placed in reverse Trendelenburg position. Ultrasound was used to  identify the patient's right femoral vein. This had normal compressibility and respiratory variation. Local anesthesia was infiltrated over the right femoral vein.  Using ultrasound guidance,  the right femoral vein was successfully cannulated.  A 0.035 J-tipped guidewire was threaded into the right femoral vein and into the inferior vena cava followed by the right atrium under fluoroscopic guidance.   Next sequential 12 and 14 dilators were placed over the guidewire into the right femoral vein.  A 16 French dilator with a peel-away sheath was then placed over the guidewire into the right femoral vein.   The guidewire and dilator were removed. A 55 cm Diatek catheter was then placed through the peel away sheath into the right atrium.  The catheter was then tunneled subcutaneously, cut to length, and the hub attached. The catheter was noted to flush and draw easily. The catheter was inspected under fluoroscopy and found with its tip to be in the right atrium without any kinks throughout its course. The catheter was sutured to the skin with nylon sutures. The groin insertion site was closed with Vicryl stitch. The catheter was then loaded with concentrated Heparin solution. A dry sterile dressing was applied.  The patient tolerated procedure well and there were no complications. Instrument sponge and needle counts were correct at the end of the case. The patient was taken to the recovery room in stable condition. Chest x-ray will be obtained in the recovery room.  Fabienne Brunsharles Fields, MD [facility] Office: [phone] Pager: [phone]

## 2013-04-02 NOTE — ED Notes (Signed)
Mr Cheree DittoGraham notified by phone of pt status and plan for surgery on AVF

## 2013-04-02 NOTE — Procedures (Signed)
Procedure:  Shuntogram with right axillary and inominate vein angioplasty Findings:  1 cm ulcer of upper right arm at mid portion of graft communicates with graft.  Guidewire exits skin at site. 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 Axillary vein treated with 7 mm and 8 mm PTA with improved patency. Inominate vein treated with 10 mm PTA with improved patency. Plan:  Discussed options with both Drs. Brabham and Fields. Dr. Darrick PennaFields has accepted patient for operative thrombectomy and revision given ulcer communicating with graft. Will keep NPO for surgery later today.

## 2013-04-02 NOTE — H&P (Signed)
Faith [name] is an 58 y.o. female.   Chief Complaint: scheduled to evaluate clotted Rt arm dialysis graft Last use Fr 2/8--good flow Early am Mon 2/9 - pt developed profuse bleeding from graft site--to ER Placed pressure dressing Went on to dialysis that day and graft was clotted--no attempt made for dialysis Scheduled now for Rt arm dialysis graft thrombolysis and possible angioplasty/stent placement. Possible dialysis catheter placement Last pta was 07/2012-- successful  HPI: ESRD; CAD/CABG; PAD; tardive dyskinesia; CHF; CVA; HLD; CVA; HTN  Past Medical History  Diagnosis Date  . Hyperparathyroidism, secondary renal   . CAD (coronary artery disease) of artery bypass graft     CABG in 2002  . PAD (peripheral artery disease)     Not a candidate for revascularization  . Chronic constipation   . GERD (gastroesophageal reflux disease)   . Anemia of chronic renal failure   . Tardive dyskinesia     Secondary to Reglan  . Depression   . Neuropathy   . Diabetic retinopathy   . Diastolic CHF, chronic     Grade 2  . Pulmonary embolism 06/2003  . Hyperlipidemia   . Stroke 2002    "w/left side" residual weakness  . Diabetes mellitus     type II  . CHF (congestive heart failure)   . CVA (cerebral infarction)     right brain with minimal left hemiparesis  . Pneumonia 10/2009; 11/2009    ?pneumonia 01/17/11  . Hypertension   . Arthritis   . Complication of anesthesia     "sleepy afterwards; due to her being dialysis pt"  . Myocardial infarction 2002  . ESRD on dialysis     [address]; M, W, F; via right upper arm AVF (01/18/2012)    Past Surgical History  Procedure Laterality Date  . Coronary artery bypass graft  2002  . Laparoscopic cholecystectomy  2002  . Av fistula placement      times 4-using rt upper arm graft-old rt lower arm,lt upper and lower old grafts  . Trigger finger release    . Tubal ligation    . Eye examination under anesthesia w/ retinal cryotherapy and  retinal laser    . Cataract extraction  07/2010    right sided   . Toe amputation  5/12    left 2nd toe secondary to osteomyelitis  . Breast biopsy  06/2010  . Carpal tunnel release  05/05/2011    Procedure: CARPAL TUNNEL RELEASE;  Surgeon: Sharma CovertFred W Ortmann, MD;  Location: [facility];  Service: Orthopedics;  Laterality: Right;  . Coronary angioplasty with stent placement    . Amputation  11/24/2011    Procedure: AMPUTATION RAY;  Surgeon: Nadara MustardMarcus V Duda, MD;  [facility];  Service: Orthopedics;  Laterality: Right;  Right Foot 2nd Ray Amputation  . Amputation  12/12/2011    Procedure: AMPUTATION BELOW KNEE;  Surgeon: Nadara MustardMarcus V Duda, MD;  Location: [facility] OR;  Service: Orthopedics;  Laterality: Right;  . 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 Amputation  01/17/2012    Procedure: AMPUTATION ABOVE KNEE;  Surgeon: Nadara MustardMarcus V Duda, MD;  Location: [facility] OR;  Service: Orthopedics;  Laterality: Right;  Right Above Knee Amputation  . I&d extremity  02/15/2012    Procedure: IRRIGATION AND DEBRIDEMENT EXTREMITY;  Surgeon: Kathryne Hitchhristopher Y Blackman, MD;  [facility];  Service: Orthopedics;  Laterality: Right;    Family History  Problem Relation Age of Onset  . Diabetes  Mother   . Cirrhosis Mother   . Diabetes Father   . Heart attack Father   . Hyperlipidemia Father   . Heart disease Father   . Diabetes Sister   . Hyperlipidemia Sister   . Hypertension Sister   . Diabetes Sister   . Diabetes Sister   . Heart disease Brother   . Hyperlipidemia Brother    Social History:  reports that she has never smoked. She has never used smokeless tobacco. She reports that she does not drink alcohol or use illicit drugs.  Allergies:  Allergies  Allergen Reactions  . Lipitor [Atorvastatin Calcium] Other (See Comments)    Muscle weakness  . Codeine Nausea And Vomiting  . Metoclopramide Hcl Other (See Comments)    Tardive dyskinesia  . Iohexol Itching    Facility where pt lives does not  have this listed as an allergy and she has not been pre-medicated in the past     (Not in a hospital admission)  No results found for this or any previous visit (from the past 48 hour(s)). No results found.  Review of Systems  Constitutional: Negative for fever and weight loss.  Respiratory: Negative for shortness of breath.   Cardiovascular: Negative for chest pain.  Gastrointestinal: Negative for nausea, vomiting and abdominal pain.  Neurological: Negative for dizziness, weakness and headaches.  Psychiatric/Behavioral: Negative for substance abuse.    There were no vitals taken for this visit. Physical Exam  Constitutional: She is oriented to person, place, and time. She appears well-developed and well-nourished.  Cardiovascular: Normal rate, regular rhythm and normal heart sounds.   No murmur heard. Respiratory: Effort normal and breath sounds normal. She has no wheezes.  GI: Soft. Bowel sounds are normal. There is no tenderness.  Musculoskeletal: Normal range of motion.  RAKA  Neurological: She is alert and oriented to person, place, and time.  Skin: Skin is warm and dry.  Psychiatric: She has a normal mood and affect. Her behavior is normal. Judgment and thought content normal.     Assessment/Plan Rt arm dialysis graft clotted Scheduled for thrombolysis and poss pta/stent Poss dialysis catheter if needed Pt aware of procedure benefits and risks and agreeable to proceed Consent signed andin chart  Marnee Sherrard A 04/02/2013, 7:37 AM

## 2013-04-02 NOTE — OR Nursing (Signed)
04/02/2013 @ 1541   3.594mL and 3.736mL placed into dialysis catheter ports.

## 2013-04-02 NOTE — Progress Notes (Signed)
[facility]

## 2013-04-02 NOTE — Discharge Instructions (Signed)
° ° °  04/02/2013 Faith AyeBarbara J [name] [id number] Jan 27, 1956  Surgeon(s): Sherren Kernsharles E Fields, MD  Procedure(s): THROMBECTOMY AND REVISION OF ARTERIOVENTOUS (AV) GORETEX  GRAFT/RIGHT UPPER ARM INSERTION OF DIALYSIS CATHETER x Do not stick graft for 4 weeks   What to eat:  For your first meals, you should eat lightly; only small meals initially.  If you do not have nausea, you may eat larger meals.  Avoid spicy, greasy and heavy food.    General Anesthesia, Adult, Care After  Refer to this sheet in the next few weeks. These instructions provide you with information on caring for yourself after your procedure. Your health care provider may also give you more specific instructions. Your treatment has been planned according to current medical practices, but problems sometimes occur. Call your health care provider if you have any problems or questions after your procedure.  WHAT TO EXPECT AFTER THE PROCEDURE  After the procedure, it is typical to experience:  Sleepiness.  Nausea and vomiting. HOME CARE INSTRUCTIONS  For the first 24 hours after general anesthesia:  Have a responsible person with you.  Do not drive a car. If you are alone, do not take public transportation.  Do not drink alcohol.  Do not take medicine that has not been prescribed by your health care provider.  Do not sign important papers or make important decisions.  You may resume a normal diet and activities as directed by your health care provider.  Change bandages (dressings) as directed.  If you have questions or problems that seem related to general anesthesia, call the hospital and ask for the anesthetist or anesthesiologist on call. SEEK MEDICAL CARE IF:  You have nausea and vomiting that continue the day after anesthesia.  You develop a rash. SEEK IMMEDIATE MEDICAL CARE IF:  You have difficulty breathing.  You have chest pain.  You have any allergic problems. Document Released: 05/16/2000 Document Revised: 10/10/2012  Document Reviewed: 08/23/2012  [facility] Patient Information 2014 [employer] [place].

## 2013-04-02 NOTE — Anesthesia Preprocedure Evaluation (Addendum)
Anesthesia Evaluation  Patient identified by MRN, date of birth, ID band Patient awake    Reviewed: Allergy & Precautions, H&P , NPO status , Patient's Chart, lab work & pertinent test results, reviewed documented beta blocker date and time   Airway Mallampati: II TM Distance: >3 FB     Dental  (+) Edentulous Upper and Edentulous Lower   Pulmonary pneumonia -,  breath sounds clear to auscultation        Cardiovascular hypertension, Pt. on medications + CAD, + Past MI, + CABG, + Peripheral Vascular Disease and +CHF Rhythm:Regular Rate:Normal     Neuro/Psych Depression Left sided weakness CVA, Residual Symptoms    GI/Hepatic GERD-  ,  Endo/Other  diabetes, Insulin Dependent  Renal/GU ESRF and DialysisRenal disease     Musculoskeletal   Abdominal   Peds  Hematology  (+) anemia ,   Anesthesia Other Findings   Reproductive/Obstetrics                          Anesthesia Physical Anesthesia Plan  ASA: III  Anesthesia Plan: General   Post-op Pain Management:    Induction: Intravenous  Airway Management Planned: LMA  Additional Equipment:   Intra-op Plan:   Post-operative Plan:   Informed Consent: I have reviewed the patients History and Physical, chart, labs and discussed the procedure including the risks, benefits and alternatives for the proposed anesthesia with the patient or authorized representative who has indicated his/her understanding and acceptance.   Dental advisory given  Plan Discussed with: CRNA, Anesthesiologist and Surgeon  Anesthesia Plan Comments:         Anesthesia Quick Evaluation

## 2013-04-02 NOTE — Anesthesia Procedure Notes (Signed)
Procedure Name: LMA Insertion Date/Time: 04/02/2013 12:54 PM Performed by: Brien MatesMAHONY, Jilene Spohr D Pre-anesthesia Checklist: Patient identified, Emergency Drugs available, Suction available, Patient being monitored and Timeout performed Patient Re-evaluated:Patient Re-evaluated prior to inductionOxygen Delivery Method: Circle system utilized Preoxygenation: Pre-oxygenation with 100% oxygen Intubation Type: IV induction Ventilation: Mask ventilation without difficulty LMA: LMA inserted LMA Size: 4.0 Tube type: Oral Number of attempts: 1 Placement Confirmation: positive ETCO2 and breath sounds checked- equal and bilateral Tube secured with: Tape Dental Injury: Teeth and Oropharynx as per pre-operative assessment

## 2013-04-02 NOTE — Transfer of Care (Signed)
Immediate Anesthesia Transfer of Care Note  Patient: Faith [name]  Procedure(s) Performed: Procedure(s): THROMBECTOMY AND REVISION OF ARTERIOVENTOUS (AV) GORETEX  GRAFT/RIGHT UPPER ARM (Right) INSERTION OF DIALYSIS CATHETER FEMORAL (Right)  Patient Location: PACU  Anesthesia Type:General  Level of Consciousness: sedated  Airway & Oxygen Therapy: Patient Spontanous Breathing and Patient connected to face mask oxygen  Post-op Assessment: Report given to PACU RN and Post -op Vital signs reviewed and stable  Post vital signs: Reviewed and stable  Complications: No apparent anesthesia complications

## 2013-04-02 NOTE — H&P (Signed)
[facility] SHORT STAY H&P  CC:  Clotted graft  HPI: Graft bleeding yesterday.  Clotted today.  Axillary and subclavian vein pta earlier today.  Has erosion mid graft.  No fever chills.  MWF [facility] [address]  Past Medical History  Diagnosis Date  . Hyperparathyroidism, secondary renal   . CAD (coronary artery disease) of artery bypass graft     CABG in 2002  . PAD (peripheral artery disease)     Not a candidate for revascularization  . Chronic constipation   . GERD (gastroesophageal reflux disease)   . Anemia of chronic renal failure   . Tardive dyskinesia     Secondary to Reglan  . Depression   . Neuropathy   . Diabetic retinopathy   . Diastolic CHF, chronic     Grade 2  . Pulmonary embolism 06/2003  . Hyperlipidemia   . Stroke 2002    "w/left side" residual weakness  . Diabetes mellitus     type II  . CHF (congestive heart failure)   . CVA (cerebral infarction)     right brain with minimal left hemiparesis  . Pneumonia 10/2009; 11/2009    ?pneumonia 01/17/11  . Hypertension   . Arthritis   . Complication of anesthesia     "sleepy afterwards; due to her being dialysis pt"  . Myocardial infarction 2002  . ESRD on dialysis     [address]; M, W, F; via right upper arm AVF (01/18/2012)    FH:  Non-Contributory  Social HX History  Substance Use Topics  . Smoking status: Never Smoker   . Smokeless tobacco: Never Used     Comment: 01/18/2012 "quit smoking  marijuana~ 15 yr ago"  . Alcohol Use: No     Comment: 01/18/2012 "occasionally I'll have a mixed drink"    Allergies Allergies  Allergen Reactions  . Lipitor [Atorvastatin Calcium] Other (See Comments)    Muscle weakness  . Codeine Nausea And Vomiting  . Metoclopramide Hcl Other (See Comments)    Tardive dyskinesia  . Iohexol Itching    Facility where pt lives does not have this listed as an allergy and she has not been pre-medicated in the past    Medications Current  Facility-Administered Medications  Medication Dose Route Frequency Provider Last Rate Last Dose  . 0.9 %  sodium chloride infusion   Intravenous Continuous Arta BruceKevin Ossey, MD       Facility-Administered Medications Ordered in Other Encounters  Medication Dose Route Frequency Provider Last Rate Last Dose  . alteplase (ACTIVASE) injection 2 mg  2 mg Intracatheter Once Abundio MiuAdam Ryan Henn, MD      . ceFAZolin (ANCEF) IVPB 2 g/50 mL premix  2 g Intravenous Q8H Reola CalkinsGlenn T Yamagata, MD   2 g at 04/02/13 [phone]  . fentaNYL (SUBLIMAZE) 0.05 MG/ML injection           . heparin 1000 UNIT/ML injection           . midazolam (VERSED) 2 MG/2ML injection             Labs K 4.8, HGB 13  PHYSICAL EXAM  General:  WDWN in NAD HENT: WNL Eyes: Pupils equal Pulmonary: normal non-labored breathing , without Rales, rhonchi,  wheezing Cardiac: RRR Vascular Exam/Pulses: Thrombosed right upper arm graft, erosion mid segment no purulence  Extremities right above knee amputation Neuro A&O x 3; good sensation; motion in all extremities  Impression: Clotted right arm AV graft with erosion  Plan: Thrombectomy revision  today right arm graft  FIELDS,CHARLES E @TODAY @ 12:02 PM

## 2013-04-02 NOTE — ED Notes (Signed)
Ulceration at AVF site

## 2013-04-02 NOTE — Anesthesia Postprocedure Evaluation (Signed)
  Anesthesia Post-op Note  Patient: Faith [name]  Procedure(s) Performed: Procedure(s): THROMBECTOMY AND REVISION OF ARTERIOVENTOUS (AV) GORETEX  GRAFT/RIGHT UPPER ARM (Right) INSERTION OF DIALYSIS CATHETER FEMORAL (Right)  Patient Location: PACU  Anesthesia Type:General  Level of Consciousness: awake  Airway and Oxygen Therapy: Patient Spontanous Breathing  Post-op Pain: mild  Post-op Assessment: Post-op Vital signs reviewed  Post-op Vital Signs: Reviewed  Complications: No apparent anesthesia complications

## 2013-04-03 LAB — GLUCOSE, CAPILLARY: Glucose-Capillary: 214 mg/dL — ABNORMAL HIGH (ref 70–99)

## 2013-04-04 ENCOUNTER — Emergency Department (HOSPITAL_COMMUNITY): Payer: Medicare (Managed Care)

## 2013-04-04 ENCOUNTER — Encounter (HOSPITAL_COMMUNITY): Payer: Self-pay | Admitting: Emergency Medicine

## 2013-04-04 DIAGNOSIS — I25810 Atherosclerosis of coronary artery bypass graft(s) without angina pectoris: Secondary | ICD-10-CM

## 2013-04-04 DIAGNOSIS — R570 Cardiogenic shock: Secondary | ICD-10-CM

## 2013-04-04 DIAGNOSIS — Z8249 Family history of ischemic heart disease and other diseases of the circulatory system: Secondary | ICD-10-CM

## 2013-04-04 DIAGNOSIS — Z9089 Acquired absence of other organs: Secondary | ICD-10-CM

## 2013-04-04 DIAGNOSIS — E785 Hyperlipidemia, unspecified: Secondary | ICD-10-CM | POA: Diagnosis present

## 2013-04-04 DIAGNOSIS — N2581 Secondary hyperparathyroidism of renal origin: Secondary | ICD-10-CM | POA: Diagnosis present

## 2013-04-04 DIAGNOSIS — S98139A Complete traumatic amputation of one unspecified lesser toe, initial encounter: Secondary | ICD-10-CM

## 2013-04-04 DIAGNOSIS — F1211 Cannabis abuse, in remission: Secondary | ICD-10-CM | POA: Diagnosis present

## 2013-04-04 DIAGNOSIS — I469 Cardiac arrest, cause unspecified: Secondary | ICD-10-CM

## 2013-04-04 DIAGNOSIS — E874 Mixed disorder of acid-base balance: Secondary | ICD-10-CM | POA: Diagnosis not present

## 2013-04-04 DIAGNOSIS — D631 Anemia in chronic kidney disease: Secondary | ICD-10-CM

## 2013-04-04 DIAGNOSIS — Z9851 Tubal ligation status: Secondary | ICD-10-CM

## 2013-04-04 DIAGNOSIS — G2401 Drug induced subacute dyskinesia: Secondary | ICD-10-CM | POA: Diagnosis present

## 2013-04-04 DIAGNOSIS — E119 Type 2 diabetes mellitus without complications: Secondary | ICD-10-CM

## 2013-04-04 DIAGNOSIS — I9589 Other hypotension: Secondary | ICD-10-CM | POA: Diagnosis not present

## 2013-04-04 DIAGNOSIS — I4510 Unspecified right bundle-branch block: Secondary | ICD-10-CM | POA: Diagnosis present

## 2013-04-04 DIAGNOSIS — N039 Chronic nephritic syndrome with unspecified morphologic changes: Secondary | ICD-10-CM

## 2013-04-04 DIAGNOSIS — D696 Thrombocytopenia, unspecified: Secondary | ICD-10-CM | POA: Diagnosis not present

## 2013-04-04 DIAGNOSIS — Y831 Surgical operation with implant of artificial internal device as the cause of abnormal reaction of the patient, or of later complication, without mention of misadventure at the time of the procedure: Secondary | ICD-10-CM | POA: Diagnosis present

## 2013-04-04 DIAGNOSIS — I69959 Hemiplegia and hemiparesis following unspecified cerebrovascular disease affecting unspecified side: Secondary | ICD-10-CM

## 2013-04-04 DIAGNOSIS — Z86711 Personal history of pulmonary embolism: Secondary | ICD-10-CM

## 2013-04-04 DIAGNOSIS — E875 Hyperkalemia: Secondary | ICD-10-CM | POA: Diagnosis present

## 2013-04-04 DIAGNOSIS — N186 End stage renal disease: Secondary | ICD-10-CM

## 2013-04-04 DIAGNOSIS — T82897A Other specified complication of cardiac prosthetic devices, implants and grafts, initial encounter: Secondary | ICD-10-CM | POA: Diagnosis present

## 2013-04-04 DIAGNOSIS — I739 Peripheral vascular disease, unspecified: Secondary | ICD-10-CM

## 2013-04-04 DIAGNOSIS — J9600 Acute respiratory failure, unspecified whether with hypoxia or hypercapnia: Secondary | ICD-10-CM

## 2013-04-04 DIAGNOSIS — I10 Essential (primary) hypertension: Secondary | ICD-10-CM | POA: Diagnosis present

## 2013-04-04 DIAGNOSIS — Z515 Encounter for palliative care: Secondary | ICD-10-CM

## 2013-04-04 DIAGNOSIS — N189 Chronic kidney disease, unspecified: Secondary | ICD-10-CM

## 2013-04-04 DIAGNOSIS — I639 Cerebral infarction, unspecified: Secondary | ICD-10-CM

## 2013-04-04 DIAGNOSIS — I2510 Atherosclerotic heart disease of native coronary artery without angina pectoris: Secondary | ICD-10-CM | POA: Diagnosis present

## 2013-04-04 DIAGNOSIS — Z888 Allergy status to other drugs, medicaments and biological substances status: Secondary | ICD-10-CM

## 2013-04-04 DIAGNOSIS — I252 Old myocardial infarction: Secondary | ICD-10-CM

## 2013-04-04 DIAGNOSIS — Z833 Family history of diabetes mellitus: Secondary | ICD-10-CM

## 2013-04-04 DIAGNOSIS — G929 Unspecified toxic encephalopathy: Secondary | ICD-10-CM | POA: Diagnosis not present

## 2013-04-04 DIAGNOSIS — Z9849 Cataract extraction status, unspecified eye: Secondary | ICD-10-CM

## 2013-04-04 DIAGNOSIS — I4891 Unspecified atrial fibrillation: Secondary | ICD-10-CM | POA: Diagnosis not present

## 2013-04-04 DIAGNOSIS — I5032 Chronic diastolic (congestive) heart failure: Secondary | ICD-10-CM

## 2013-04-04 DIAGNOSIS — I214 Non-ST elevation (NSTEMI) myocardial infarction: Principal | ICD-10-CM

## 2013-04-04 DIAGNOSIS — Z951 Presence of aortocoronary bypass graft: Secondary | ICD-10-CM

## 2013-04-04 DIAGNOSIS — S78119A Complete traumatic amputation at level between unspecified hip and knee, initial encounter: Secondary | ICD-10-CM

## 2013-04-04 DIAGNOSIS — E1139 Type 2 diabetes mellitus with other diabetic ophthalmic complication: Secondary | ICD-10-CM | POA: Diagnosis present

## 2013-04-04 DIAGNOSIS — I509 Heart failure, unspecified: Secondary | ICD-10-CM | POA: Diagnosis present

## 2013-04-04 DIAGNOSIS — I120 Hypertensive chronic kidney disease with stage 5 chronic kidney disease or end stage renal disease: Secondary | ICD-10-CM | POA: Diagnosis present

## 2013-04-04 DIAGNOSIS — E11319 Type 2 diabetes mellitus with unspecified diabetic retinopathy without macular edema: Secondary | ICD-10-CM | POA: Diagnosis present

## 2013-04-04 DIAGNOSIS — Z66 Do not resuscitate: Secondary | ICD-10-CM | POA: Diagnosis not present

## 2013-04-04 DIAGNOSIS — N2589 Other disorders resulting from impaired renal tubular function: Secondary | ICD-10-CM | POA: Diagnosis not present

## 2013-04-04 DIAGNOSIS — F3289 Other specified depressive episodes: Secondary | ICD-10-CM | POA: Diagnosis present

## 2013-04-04 DIAGNOSIS — R0681 Apnea, not elsewhere classified: Secondary | ICD-10-CM | POA: Diagnosis not present

## 2013-04-04 DIAGNOSIS — R4182 Altered mental status, unspecified: Secondary | ICD-10-CM

## 2013-04-04 DIAGNOSIS — K219 Gastro-esophageal reflux disease without esophagitis: Secondary | ICD-10-CM | POA: Diagnosis present

## 2013-04-04 DIAGNOSIS — I2582 Chronic total occlusion of coronary artery: Secondary | ICD-10-CM | POA: Diagnosis present

## 2013-04-04 DIAGNOSIS — K5900 Constipation, unspecified: Secondary | ICD-10-CM | POA: Diagnosis present

## 2013-04-04 DIAGNOSIS — T411X5A Adverse effect of intravenous anesthetics, initial encounter: Secondary | ICD-10-CM | POA: Diagnosis not present

## 2013-04-04 DIAGNOSIS — G92 Toxic encephalopathy: Secondary | ICD-10-CM | POA: Diagnosis not present

## 2013-04-04 DIAGNOSIS — Z9861 Coronary angioplasty status: Secondary | ICD-10-CM

## 2013-04-04 DIAGNOSIS — T8740 Infection of amputation stump, unspecified extremity: Secondary | ICD-10-CM

## 2013-04-04 DIAGNOSIS — F329 Major depressive disorder, single episode, unspecified: Secondary | ICD-10-CM | POA: Diagnosis present

## 2013-04-04 DIAGNOSIS — Z992 Dependence on renal dialysis: Secondary | ICD-10-CM

## 2013-04-04 DIAGNOSIS — IMO0002 Reserved for concepts with insufficient information to code with codable children: Secondary | ICD-10-CM | POA: Diagnosis not present

## 2013-04-04 LAB — APTT: aPTT: 175 seconds — ABNORMAL HIGH (ref 24–37)

## 2013-04-04 LAB — CK TOTAL AND CKMB (NOT AT ARMC)
CK, MB: 20.3 ng/mL — AB (ref 0.3–4.0)
RELATIVE INDEX: 5.8 — AB (ref 0.0–2.5)
Total CK: 350 U/L — ABNORMAL HIGH (ref 7–177)

## 2013-04-04 LAB — CBC WITH DIFFERENTIAL/PLATELET
BASOS PCT: 0 % (ref 0–1)
Basophils Absolute: 0 10*3/uL (ref 0.0–0.1)
EOS ABS: 0 10*3/uL (ref 0.0–0.7)
Eosinophils Relative: 0 % (ref 0–5)
HCT: 35.4 % — ABNORMAL LOW (ref 36.0–46.0)
Hemoglobin: 11.4 g/dL — ABNORMAL LOW (ref 12.0–15.0)
LYMPHS ABS: 1.7 10*3/uL (ref 0.7–4.0)
Lymphocytes Relative: 17 % (ref 12–46)
MCH: 27.5 pg (ref 26.0–34.0)
MCHC: 32.2 g/dL (ref 30.0–36.0)
MCV: 85.3 fL (ref 78.0–100.0)
Monocytes Absolute: 1.1 10*3/uL — ABNORMAL HIGH (ref 0.1–1.0)
Monocytes Relative: 11 % (ref 3–12)
NEUTROS PCT: 73 % (ref 43–77)
Neutro Abs: 7.4 10*3/uL (ref 1.7–7.7)
Platelets: 151 10*3/uL (ref 150–400)
RBC: 4.15 MIL/uL (ref 3.87–5.11)
RDW: 16.3 % — ABNORMAL HIGH (ref 11.5–15.5)
WBC: 10.2 10*3/uL (ref 4.0–10.5)

## 2013-04-04 LAB — BASIC METABOLIC PANEL
BUN: 49 mg/dL — AB (ref 6–23)
CHLORIDE: 90 meq/L — AB (ref 96–112)
CO2: 17 meq/L — AB (ref 19–32)
Calcium: 8.2 mg/dL — ABNORMAL LOW (ref 8.4–10.5)
Creatinine, Ser: 7.81 mg/dL — ABNORMAL HIGH (ref 0.50–1.10)
GFR calc non Af Amer: 5 mL/min — ABNORMAL LOW (ref >90)
GFR, EST AFRICAN AMERICAN: 6 mL/min — AB (ref >90)
Glucose, Bld: 364 mg/dL — ABNORMAL HIGH (ref 70–99)
Potassium: 6.3 mEq/L — ABNORMAL HIGH (ref 3.7–5.3)
Sodium: 134 mEq/L — ABNORMAL LOW (ref 137–147)

## 2013-04-04 LAB — POCT I-STAT, CHEM 8
BUN: 50 mg/dL — ABNORMAL HIGH (ref 6–23)
Calcium, Ion: 1 mmol/L — ABNORMAL LOW (ref 1.12–1.23)
Chloride: 98 mEq/L (ref 96–112)
Creatinine, Ser: 8.5 mg/dL — ABNORMAL HIGH (ref 0.50–1.10)
Glucose, Bld: 290 mg/dL — ABNORMAL HIGH (ref 70–99)
HEMATOCRIT: 37 % (ref 36.0–46.0)
HEMOGLOBIN: 12.6 g/dL (ref 12.0–15.0)
POTASSIUM: 5.1 meq/L (ref 3.7–5.3)
SODIUM: 135 meq/L — AB (ref 137–147)
TCO2: 22 mmol/L (ref 0–100)

## 2013-04-04 LAB — PROTIME-INR
INR: 2.29 — ABNORMAL HIGH (ref 0.00–1.49)
Prothrombin Time: 24.5 seconds — ABNORMAL HIGH (ref 11.6–15.2)

## 2013-04-04 LAB — TROPONIN I
Troponin I: 7.91 ng/mL (ref <0.30)
Troponin I: 14.4 ng/mL (ref <0.30)

## 2013-04-04 LAB — POCT I-STAT TROPONIN I: TROPONIN I, POC: 9.71 ng/mL — AB (ref 0.00–0.08)

## 2013-04-04 LAB — GLUCOSE, CAPILLARY
Glucose-Capillary: 358 mg/dL — ABNORMAL HIGH (ref 70–99)
Glucose-Capillary: 174 mg/dL — ABNORMAL HIGH (ref 70–99)

## 2013-04-04 LAB — MRSA PCR SCREENING: MRSA BY PCR: NEGATIVE

## 2013-04-04 LAB — PRO B NATRIURETIC PEPTIDE: Pro B Natriuretic peptide (BNP): 44743 pg/mL — ABNORMAL HIGH (ref 0–125)

## 2013-04-04 MED ORDER — CINACALCET HCL 30 MG PO TABS
30.0000 mg | ORAL_TABLET | Freq: Every day | ORAL | Status: DC
  Administered 2013-04-05: 30 mg via ORAL
  Filled 2013-04-04 (×3): qty 1

## 2013-04-04 MED ORDER — DIPHENHYDRAMINE HCL 50 MG/ML IJ SOLN
25.0000 mg | INTRAMUSCULAR | Status: AC
  Administered 2013-04-05: 25 mg via INTRAVENOUS
  Filled 2013-04-04: qty 1

## 2013-04-04 MED ORDER — HEPARIN (PORCINE) IN NACL 100-0.45 UNIT/ML-% IJ SOLN
900.0000 [IU]/h | INTRAMUSCULAR | Status: DC
  Administered 2013-04-04: 900 [IU]/h via INTRAVENOUS
  Filled 2013-04-04 (×2): qty 250

## 2013-04-04 MED ORDER — ASPIRIN 81 MG PO TABS: 81.0000 mg | ORAL_TABLET | Freq: Every day | ORAL | Status: DC

## 2013-04-04 MED ORDER — SEVELAMER CARBONATE 800 MG PO TABS
800.0000 mg | ORAL_TABLET | Freq: Three times a day (TID) | ORAL | Status: DC
  Administered 2013-04-05: 800 mg via ORAL
  Filled 2013-04-04 (×7): qty 1

## 2013-04-04 MED ORDER — NITROGLYCERIN 0.4 MG SL SUBL: 0.4000 mg | SUBLINGUAL_TABLET | SUBLINGUAL | Status: DC | PRN

## 2013-04-04 MED ORDER — POLYVINYL ALCOHOL 1.4 % OP SOLN
1.0000 [drp] | Freq: Every day | OPHTHALMIC | Status: DC | PRN
  Filled 2013-04-04: qty 15

## 2013-04-04 MED ORDER — ASPIRIN EC 81 MG PO TBEC
81.0000 mg | DELAYED_RELEASE_TABLET | Freq: Every day | ORAL | Status: DC
  Filled 2013-04-04 (×2): qty 1

## 2013-04-04 MED ORDER — HEPARIN BOLUS VIA INFUSION
3000.0000 [IU] | Freq: Once | INTRAVENOUS | Status: AC
  Administered 2013-04-04: 3000 [IU] via INTRAVENOUS
  Filled 2013-04-04: qty 3000

## 2013-04-04 MED ORDER — INSULIN GLARGINE 100 UNIT/ML ~~LOC~~ SOLN
20.0000 [IU] | Freq: Every day | SUBCUTANEOUS | Status: DC
  Administered 2013-04-05 – 2013-04-06 (×2): 20 [IU] via SUBCUTANEOUS
  Filled 2013-04-04 (×3): qty 0.2

## 2013-04-04 MED ORDER — SODIUM CHLORIDE 0.9 % IV SOLN
INTRAVENOUS | Status: DC
  Administered 2013-04-05: 10 mL/h via INTRAVENOUS

## 2013-04-04 MED ORDER — ASPIRIN EC 81 MG PO TBEC
81.0000 mg | DELAYED_RELEASE_TABLET | Freq: Every day | ORAL | Status: DC
Start: 2013-04-05 — End: 2013-04-04

## 2013-04-04 MED ORDER — ASPIRIN 81 MG PO CHEW
81.0000 mg | CHEWABLE_TABLET | ORAL | Status: AC
  Administered 2013-04-05: 81 mg via ORAL
  Filled 2013-04-04: qty 1

## 2013-04-04 MED ORDER — SODIUM CHLORIDE 0.9 % IJ SOLN: 3.0000 mL | INTRAMUSCULAR | Status: DC | PRN

## 2013-04-04 MED ORDER — ASPIRIN 81 MG PO CHEW
162.0000 mg | CHEWABLE_TABLET | Freq: Once | ORAL | Status: AC
  Administered 2013-04-04: 162 mg via ORAL
  Filled 2013-04-04: qty 2

## 2013-04-04 MED ORDER — CINACALCET HCL 30 MG PO TABS: 30.0000 mg | ORAL_TABLET | Freq: Every day | ORAL | Status: DC

## 2013-04-04 MED ORDER — RENA-VITE PO TABS
1.0000 | ORAL_TABLET | Freq: Every day | ORAL | Status: DC
  Administered 2013-04-05: 1 via ORAL
  Filled 2013-04-04: qty 1

## 2013-04-04 MED ORDER — FAMOTIDINE IN NACL 20-0.9 MG/50ML-% IV SOLN
20.0000 mg | INTRAVENOUS | Status: DC
  Filled 2013-04-04 (×2): qty 50

## 2013-04-04 MED ORDER — INSULIN ASPART 100 UNIT/ML ~~LOC~~ SOLN
0.0000 [IU] | SUBCUTANEOUS | Status: DC
  Administered 2013-04-04: 9 [IU] via SUBCUTANEOUS
  Administered 2013-04-05: 2 [IU] via SUBCUTANEOUS
  Administered 2013-04-05: 1 [IU] via SUBCUTANEOUS
  Administered 2013-04-05: 2 [IU] via SUBCUTANEOUS
  Administered 2013-04-06 (×2): 3 [IU] via SUBCUTANEOUS
  Filled 2013-04-04: qty 1

## 2013-04-04 MED ORDER — SODIUM CHLORIDE 0.9 % IV SOLN: 250.0000 mL | INTRAVENOUS | Status: DC | PRN

## 2013-04-04 MED ORDER — SODIUM CHLORIDE 0.9 % IJ SOLN
3.0000 mL | Freq: Two times a day (BID) | INTRAMUSCULAR | Status: DC
  Administered 2013-04-05: 3 mL via INTRAVENOUS

## 2013-04-04 MED ORDER — DIAZEPAM 2 MG PO TABS: 2.0000 mg | ORAL_TABLET | ORAL | Status: DC

## 2013-04-04 MED ORDER — CALCITRIOL 0.5 MCG PO CAPS
1.0000 ug | ORAL_CAPSULE | ORAL | Status: DC
  Filled 2013-04-04 (×2): qty 2

## 2013-04-04 MED ORDER — SODIUM CHLORIDE 0.9 % IV SOLN
INTRAVENOUS | Status: DC
  Administered 2013-04-05: 1 mL via INTRAVENOUS

## 2013-04-04 MED ORDER — CINACALCET HCL 30 MG PO TABS
90.0000 mg | ORAL_TABLET | Freq: Every day | ORAL | Status: DC
  Administered 2013-04-05: 90 mg via ORAL
  Filled 2013-04-04 (×3): qty 3

## 2013-04-04 MED ORDER — LIDOCAINE-PRILOCAINE 2.5-2.5 % EX CREA: 1.0000 "application " | TOPICAL_CREAM | CUTANEOUS | Status: DC | PRN

## 2013-04-04 MED ORDER — METHYLPREDNISOLONE SODIUM SUCC 125 MG IJ SOLR
125.0000 mg | INTRAMUSCULAR | Status: AC
  Administered 2013-04-05: 125 mg via INTRAVENOUS
  Filled 2013-04-04: qty 2

## 2013-04-04 MED ORDER — GABAPENTIN 400 MG PO CAPS
400.0000 mg | ORAL_CAPSULE | Freq: Two times a day (BID) | ORAL | Status: DC
  Administered 2013-04-05: 400 mg via ORAL
  Filled 2013-04-04 (×3): qty 1

## 2013-04-04 MED ORDER — PANTOPRAZOLE SODIUM 40 MG PO TBEC
40.0000 mg | DELAYED_RELEASE_TABLET | Freq: Every day | ORAL | Status: DC
  Administered 2013-04-05: 40 mg via ORAL
  Filled 2013-04-04 (×2): qty 1

## 2013-04-04 MED ORDER — METOPROLOL TARTRATE 12.5 MG HALF TABLET
12.5000 mg | ORAL_TABLET | Freq: Two times a day (BID) | ORAL | Status: DC
  Administered 2013-04-05: 12.5 mg via ORAL
  Filled 2013-04-04 (×3): qty 1

## 2013-04-04 MED ORDER — RENA-VITE PO TABS
1.0000 | ORAL_TABLET | Freq: Every day | ORAL | Status: DC
  Filled 2013-04-04 (×3): qty 1

## 2013-04-04 NOTE — ED Notes (Signed)
Pt complaining of weakness since surgery on Monday for new graft site on right arm. Pt not eating well or sleeping well since. Pt had dialysis yesterday. Hx of MI and stroke. 132/80, CBG 325, sinus rhythm.

## 2013-04-04 NOTE — Consult Note (Signed)
Faith [name] Renal Consultation Note    Indication for Consultation:  Management of ESRD/hemodialysis; anemia, hypertension/volume and secondary hyperparathyroidism  HPI: Faith [name] is a 58 y.o. female ESRD patient with multiple medical problems, including CVA and CABG who was transported to the ED when [facility] providers found her to be groggy, with progressive weakness and inability to board the [place] at the [facility] facility. She was seen in the ED on 2/9 for uncontrolled bleeding and subsequent thrombosis of her right upper arm AVG. She was discharged on narcotic pain medications and complaints of nausea and weakness the next day.  At that time, it was felt that she was overmedicated. However, when similar symptoms returned today, [facility] staff felt that further evaluation was warranted. ED work-up is significant for NSTEMI (troponin 7.91), hyperkalemia (K+ 6.3), and vascular congestion on CXR.  At the time of this encounter, she endorses back pain, dyspnea, heat intolerance, and significant fatigue to the extent that she feels she'll have difficulty feeding herself. She last dialyzed as an outpatient on 2/11, but arrived 7kg above her dry weight as a result of access issues/missed treatments.   Past Medical History  Diagnosis Date  . Hyperparathyroidism, secondary renal   . CAD (coronary artery disease) of artery bypass graft     CABG in 2002  . PAD (peripheral artery disease)     Not a candidate for revascularization  . Chronic constipation   . GERD (gastroesophageal reflux disease)   . Anemia of chronic renal failure   . Tardive dyskinesia     Secondary to Reglan  . Depression   . Neuropathy   . Diabetic retinopathy   . Diastolic CHF, chronic     Grade 2  . Pulmonary embolism 06/2003  . Hyperlipidemia   . Stroke 2002    "w/left side" residual weakness  . Diabetes mellitus     type II  . CHF (congestive heart failure)   . CVA (cerebral infarction)     right brain with  minimal left hemiparesis  . Pneumonia 10/2009; 11/2009    ?pneumonia 01/17/11  . Hypertension   . Arthritis   . Complication of anesthesia     "sleepy afterwards; due to her being dialysis pt"  . Myocardial infarction 2002  . ESRD on dialysis     Faith [name]; M, W, F; via right upper arm AVF (01/18/2012)   Past Surgical History  Procedure Laterality Date  . Coronary artery bypass graft  2002  . Laparoscopic cholecystectomy  2002  . Av fistula placement      times 4-using rt upper arm graft-old rt lower arm,lt upper and lower old grafts  . Trigger finger release    . Tubal ligation    . Eye examination under anesthesia w/ retinal cryotherapy and retinal laser    . Cataract extraction  07/2010    right sided   . Toe amputation  5/12    left 2nd toe secondary to osteomyelitis  . Breast biopsy  06/2010  . Carpal tunnel release  05/05/2011    Procedure: CARPAL TUNNEL RELEASE;  Surgeon: Sharma Covert, MD;  Location: [facility];  Service: Orthopedics;  Laterality: Right;  . Coronary angioplasty with stent placement    . Amputation  11/24/2011    Procedure: AMPUTATION RAY;  Surgeon: Nadara Mustard, MD;  Location: [facility] OR;  Service: Orthopedics;  Laterality: Right;  Right Foot 2nd Ray Amputation  . Amputation  12/12/2011    Procedure: AMPUTATION  BELOW KNEE;  Surgeon: Nadara Mustard, MD;  Location: [facility] OR;  Service: Orthopedics;  Laterality: Right;  . 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 Amputation  01/17/2012    Procedure: AMPUTATION ABOVE KNEE;  Surgeon: Nadara Mustard, MD;  Location: [facility] OR;  Service: Orthopedics;  Laterality: Right;  Right Above Knee Amputation  . I&d extremity  02/15/2012    Procedure: IRRIGATION AND DEBRIDEMENT EXTREMITY;  Surgeon: Kathryne Hitch, MD;  Location: [facility] OR;  Service: Orthopedics;  Laterality: Right;  . Thrombectomy and revision of arterioventous (av) goretex  graft Right 04/02/2013    Procedure: THROMBECTOMY AND REVISION OF  ARTERIOVENTOUS (AV) GORETEX  GRAFT/RIGHT UPPER ARM;  Surgeon: Sherren Kerns, MD;  Location: [facility] OR;  Service: Vascular;  Laterality: Right;  . Insertion of dialysis catheter Right 04/02/2013    Procedure: INSERTION OF DIALYSIS CATHETER FEMORAL;  Surgeon: Sherren Kerns, MD;  Location: [facility] OR;  Service: Vascular;  Laterality: Right;   Family History  Problem Relation Age of Onset  . Diabetes Mother   . Cirrhosis Mother   . Diabetes Father   . Heart attack Father   . Hyperlipidemia Father   . Heart disease Father   . Diabetes Sister   . Hyperlipidemia Sister   . Hypertension Sister   . Diabetes Sister   . Diabetes Sister   . Heart disease Brother   . Hyperlipidemia Brother    Social History:  reports that she has never smoked. She has never used smokeless tobacco. She reports that she does not drink alcohol or use illicit drugs. Allergies  Allergen Reactions  . Lipitor [Atorvastatin Calcium] Other (See Comments)    Muscle weakness  . Codeine Nausea And Vomiting  . Metoclopramide Hcl Other (See Comments)    Tardive dyskinesia  . Iohexol Itching    Facility where pt lives does not have this listed as an allergy and she has not been pre-medicated in the past  . Oxycodone Hypertension   Prior to Admission medications   Medication Sig Start Date End Date Taking? Authorizing Provider  aspirin 81 MG tablet Take 81 mg by mouth daily.   Yes Historical Provider, MD  cinacalcet (SENSIPAR) 30 MG tablet Take 30-90 mg by mouth daily. Pt takes 30 mg in the morning and 90 mg in the morning   Yes Historical Provider, MD  doxycycline (ADOXA) 100 MG tablet Take 100 mg by mouth daily. For 30 days. Stop date 04/07/12   Yes Historical Provider, MD  fluticasone (FLONASE) 50 MCG/ACT nasal spray Place 2 sprays into the nose 2 (two) times daily.   Yes Historical Provider, MD  gabapentin (NEURONTIN) 400 MG capsule Take 400 mg by mouth 2 (two) times daily. Pt takes one in the am and one at night   Yes  Historical Provider, MD  insulin glargine (LANTUS) 100 UNIT/ML injection Inject 50 Units into the skin at bedtime.   Yes Historical Provider, MD  lidocaine-prilocaine (EMLA) cream Apply 1 application topically as needed (on dialysis port).   Yes Historical Provider, MD  multivitamin (RENA-VIT) TABS tablet Take 1 tablet by mouth daily.    Yes Historical Provider, MD  nitroGLYCERIN (NITROSTAT) 0.4 MG SL tablet Place 0.4 mg under the tongue every 5 (five) minutes as needed. For chest pain   Yes Historical Provider, MD  pantoprazole (PROTONIX) 40 MG tablet Take 40 mg by mouth daily. Take 30 min before breakfast   Yes Historical Provider, MD  polyvinyl  alcohol (LIQUIFILM TEARS) 1.4 % ophthalmic solution Place 1 drop into both eyes daily as needed. For dryness   Yes Historical Provider, MD  sevelamer (RENAGEL) 800 MG tablet Take 800 mg by mouth daily. Take 3 tabs with meals and 2 tabs with snacks   Yes Historical Provider, MD  sodium chloride (OCEAN) 0.65 % nasal spray Place 2 sprays into the nose 4 (four) times daily as needed. For nasal congestion   Yes Historical Provider, MD  tetrahydrozoline 0.05 % ophthalmic solution Place 1 drop into both eyes daily.   Yes Historical Provider, MD   Current Facility-Administered Medications  Medication Dose Route Frequency Provider Last Rate Last Dose  . heparin ADULT infusion 100 units/mL (25000 units/250 mL)  900 Units/hr Intravenous Continuous Lauren Bajbus, RPH 9 mL/hr at 03/29/2013 1431 900 Units/hr at 04/14/2013 1431  . [START ON 03/31/2013] insulin aspart (novoLOG) injection 0-9 Units  0-9 Units Subcutaneous 6 times per day Nada Boozer, NP   9 Units at 03/31/2013 1614   Current Outpatient Prescriptions  Medication Sig Dispense Refill  . aspirin 81 MG tablet Take 81 mg by mouth daily.      . cinacalcet (SENSIPAR) 30 MG tablet Take 30-90 mg by mouth daily. Pt takes 30 mg in the morning and 90 mg in the morning      . doxycycline (ADOXA) 100 MG tablet Take 100 mg  by mouth daily. For 30 days. Stop date 04/07/12      . fluticasone (FLONASE) 50 MCG/ACT nasal spray Place 2 sprays into the nose 2 (two) times daily.      Marland Kitchen gabapentin (NEURONTIN) 400 MG capsule Take 400 mg by mouth 2 (two) times daily. Pt takes one in the am and one at night      . insulin glargine (LANTUS) 100 UNIT/ML injection Inject 50 Units into the skin at bedtime.      . lidocaine-prilocaine (EMLA) cream Apply 1 application topically as needed (on dialysis port).      . multivitamin (RENA-VIT) TABS tablet Take 1 tablet by mouth daily.       . nitroGLYCERIN (NITROSTAT) 0.4 MG SL tablet Place 0.4 mg under the tongue every 5 (five) minutes as needed. For chest pain      . pantoprazole (PROTONIX) 40 MG tablet Take 40 mg by mouth daily. Take 30 min before breakfast      . polyvinyl alcohol (LIQUIFILM TEARS) 1.4 % ophthalmic solution Place 1 drop into both eyes daily as needed. For dryness      . sevelamer (RENAGEL) 800 MG tablet Take 800 mg by mouth daily. Take 3 tabs with meals and 2 tabs with snacks      . sodium chloride (OCEAN) 0.65 % nasal spray Place 2 sprays into the nose 4 (four) times daily as needed. For nasal congestion      . tetrahydrozoline 0.05 % ophthalmic solution Place 1 drop into both eyes daily.       Labs: Basic Metabolic Panel:  Recent Labs Lab 04/02/13 0730 04/02/13 1100 04/10/2013 1447  NA  --  137 134*  K 5.2 4.8 6.3*  CL  --   --  90*  CO2  --   --  17*  GLUCOSE  --  270* 364*  BUN  --   --  49*  CREATININE  --   --  7.81*  CALCIUM  --   --  8.2*   CBC:  Recent Labs Lab 04/02/13 1100 03/26/2013 1222  WBC  --  10.2  NEUTROABS  --  7.4  HGB 13.3 11.4*  HCT 39.0 35.4*  MCV  --  85.3  PLT  --  151   Cardiac Enzymes:  Recent Labs Lab 03/28/2013 1446  CKTOTAL 350*  CKMB 20.3*  TROPONINI 7.91*   CBG:  Recent Labs Lab 04/02/13 1213 04/02/13 1603 04/06/2013 1608  GLUCAP 214* 182* 358*   Studies/Results: Dg Chest 2 View  04/07/2013   CLINICAL  DATA:  Chest pain, shortness of breath.  EXAM: CHEST  2 VIEW  COMPARISON:  April 02, 2013.  FINDINGS: Stable cardiomegaly. Status post coronary artery bypass graft. Distal tips of dialysis catheter extending from inferior vena cava are unchanged in position within the right atrium. Stable central pulmonary vascular congestion is noted. Bilateral pulmonary edema noted on prior exam does appear to be significantly improved. No pneumothorax or significant pleural effusion is noted.  IMPRESSION: Bilateral pulmonary edema noted on prior exam does appear to be significantly improved, although mild central pulmonary vascular congestion remains.   Electronically Signed   By: Roque Lias M.D.   On: 04/20/2013 12:13    ROS: back pain, dyspnea, weakness and fatigue. 10 pt ROS asked and answered. All other systems negative except as in the HPI above.   Physical Exam: Filed Vitals:   04/12/2013 1339 04/14/2013 1340 04/20/2013 1400 04/06/2013 1443  BP:  102/62 125/68   Pulse:  82 82 80  Temp:      TempSrc:      Resp:  18 13 17   Height: 5\' 4"  (1.626 m)     Weight:   77 kg (169 lb 12.1 oz)   SpO2:  99% 100%      General: Fatigued, facial edema, looks uncomfortable Head: Normocephalic, atraumatic, sclera non-icteric, mucus membranes are moist Neck: Supple. JVD noted Lungs: Diminished in all field. Faint crackles. No wheezes, rales, or rhonchi. Winded speech Rales R base Heart: RRR with S1 S2. No murmurs, rubs, or gallops appreciated. Abdomen: Obese, soft, non-tender, non-distended with normoactive bowel sounds. No rebound/guarding. No obvious abdominal masses. M-S:  Strength and tone appear normal for age. Lower extremities: R AKA, Compression hose on LLE/ no edema on left. Neuro: Alert and oriented X 3. Moves all extremities spontaneously. Tardive dyskinesia at baseline (tongue) Psych:  Responds to questions appropriately with a normal affect. Dialysis Access: Rt fem TDC, RUA AVG ace-wrapped s/p  revision  Dialysis Orders: Center: [facility]  on MWF EDW 76kg HD Bath 2K/2Ca  Time 3:30 Heparin 5000. Access Rt Fem TDC / RUA AVG revised 2/10. BFR 400 DFR 800   Calcitriol 1.0 mcg PO/ HD Epogen 0   Units IV/HD  Venofer  0  Recent labs: Hgb 11.6, Tsat 21%, Ferr 1621, P 2.8, PTH 231  Assessment/Plan: 1. CAD/ NSTEMI - Mgmt per cardiology. Troponin 9.75/ 7.91. Heparin drip started. Benefits outweigh risks of bleeding at surgical sight. Possible Cath tomorrow. 2. Hyperkalemia - K+ 6.3, secondary to under-dialysis this week. Emergent HD as a separate. 3. HD access: Seen in ED 2/9 for uncontrolled bleeding/ subsequent thrombosis of RUA AVG. To OR for revision and placement of Rt Fem TDC on 2/10 per [facility] 4. Hypertension/volume  - SBPs 120s. Volume excess on CXR and physical exam. Up 7kg at outpatient center yesterday with net UF of ~3.2L.  HD today with UF 3-4L as tolerated. Again tomorrow to edw and to stay on schedule. Is not on any BP meds at home.  5. Anemia  - Hgb 11.4, consistent with  op labs. No ESAs or IV Fe op 6. Metabolic bone disease -  Ca 8.2. Last op Phos 2.8. PTH within range. Previously on Hectorol 6, now with op orders to start Calcitriol 1.0 mcg  PO TIW effective tomorrow.  Continue low Ca bath. Hold op Renagel 3 ac for now. Verify if still taking Sensipar 30q am and 90 qpm 7. Nutrition - Renal/carb mod diet, multivitamin 8. DM - mgmt per primary. On insulin  Claud KelpKaren Warren, PA-C [name] Pager [phone] 04/06/2013, 4:32 PM   I have seen and examined patient, discussed with PA and agree with assessment and plan as outlined above with additions as indicated.  58 yo with multiple medical problems including CAD, hx CABG, ESRD, DM presenting with gen weakness and NSTEMI (trop 7.91). She has had access problems this past week and went 5 days without HD, had access revision and thigh catheter placed.  She was 7kg up yesterday at outpt dialysis. Today K+ is high and she really needs  dialysis, hopefully BP will tolerate.  See orders.    Vinson Moselleob Fredi Geiler MD pager [phone]    cell [phone] 04/16/2013, 6:12 PM

## 2013-04-04 NOTE — ED Notes (Signed)
CRITICAL VALUE ALERT  Critical value received: troponin 7.91, CK 20.3  Date of notification:  February 23, 2013  Time of notification:  1611  Critical value read back:yes  Nurse who received alert:  Heide GuileHope Zissel Biederman  MD notified (1st page):  Nada BoozerLaura Ingold, NP  Time of first page:  1612  MD notified (2nd page):   Responding MD:  Nada BoozerLaura Ingold, NP  Time MD responded:  [phone]

## 2013-04-04 NOTE — Progress Notes (Signed)
ANTICOAGULATION CONSULT NOTE - Initial Consult  Pharmacy Consult for heparin Indication: chest pain/ACS  Allergies  Allergen Reactions  . Lipitor [Atorvastatin Calcium] Other (See Comments)    Muscle weakness  . Codeine Nausea And Vomiting  . Metoclopramide Hcl Other (See Comments)    Tardive dyskinesia  . Iohexol Itching    Facility where pt lives does not have this listed as an allergy and she has not been pre-medicated in the past  . Oxycodone Hypertension    Patient Measurements: Height: 5\' 4"  (162.6 cm) Weight: 169 lb 12.1 oz (77 kg) IBW/kg (Calculated) : 54.7 Heparin Dosing Weight: 71kg  Vital Signs: Temp: 97 F (36.1 C) (02/12 1128) Temp src: Oral (02/12 1128) BP: 125/68 mmHg (02/12 1400) Pulse Rate: 82 (02/12 1400)  Labs:  Recent Labs  04/02/13 1100 04/20/2013 1222  HGB 13.3 11.4*  HCT 39.0 35.4*  PLT  --  151    Estimated Creatinine Clearance: 12.8 ml/min (by C-G formula based on Cr of 4.76).   Medical History: Past Medical History  Diagnosis Date  . Hyperparathyroidism, secondary renal   . CAD (coronary artery disease) of artery bypass graft     CABG in 2002  . PAD (peripheral artery disease)     Not a candidate for revascularization  . Chronic constipation   . GERD (gastroesophageal reflux disease)   . Anemia of chronic renal failure   . Tardive dyskinesia     Secondary to Reglan  . Depression   . Neuropathy   . Diabetic retinopathy   . Diastolic CHF, chronic     Grade 2  . Pulmonary embolism 06/2003  . Hyperlipidemia   . Stroke 2002    "w/left side" residual weakness  . Diabetes mellitus     type II  . CHF (congestive heart failure)   . CVA (cerebral infarction)     right brain with minimal left hemiparesis  . Pneumonia 10/2009; 11/2009    ?pneumonia 01/17/11  . Hypertension   . Arthritis   . Complication of anesthesia     "sleepy afterwards; due to her being dialysis pt"  . Myocardial infarction 2002  . ESRD on dialysis     Rudene AndaHenry  Street; M, W, F; via right upper arm AVF (01/18/2012)    Assessment: [age] YOF with ESRD on HD MWF (last HD yesterday). Seen in ED for global weakness. Istat troponin elevated and cardiology advised EDP to start heparin per pharmacy. Of note, patient had graft bleeding a few days ago which then clotted off. Vascular has been seeing- attempted thrombectomy but unsuccessful. No other bleeding noted. Not on anticoagulation PTA. Baseline Hgb 11.4, plts 151.  Goal of Therapy:  Heparin level 0.3-0.7 units/ml Monitor platelets by anticoagulation protocol: Yes   Plan:  1. Stat aPTT and PT/INR 2. Heparin loading dose with 3000 units IV x1 3. Start heparin gtt at 900 units/hr 4. Heparin level in 6 hours 5. Daily heparin level and CBC 6. Follow very closely for any re-bleeding from graft site  Yudith Norlander D. Carlis Blanchard, PharmD, BCPS Clinical Pharmacist Pager: [phone] 04/09/2013 2:12 PM

## 2013-04-04 NOTE — Progress Notes (Signed)
Dr Deterding called and informed of pt being hypotensive pre hemodialysis Tx. Goal change to keep even

## 2013-04-04 NOTE — Progress Notes (Addendum)
Discussed with Dr. Imogene Burnhen with oozing at AV graft site and need for Heparin with elevated Troponin.  He agrees with need for Heparin, if bleeding occurs hold pressure, currently it is wrapped.     I also notified renal of consult.

## 2013-04-04 NOTE — ED Provider Notes (Signed)
CSN: [id number]     Arrival date & time 04/20/2013  1041 History   First MD Initiated Contact with Patient 04/18/2013 1117     Chief Complaint  Patient presents with  . Weakness     (Consider location/radiation/quality/duration/timing/severity/associated sxs/prior Treatment) HPI Comments: 58 yo AA female with MMP to include CAD, Hyperparathyroidism, DM,  PAD, GERD, anemia, tardive dyskinesia, Neuropathy, ESRF - HD M,W,F AM, stroke, CHF presents with cc of weakness.    Pt is accompanied by [facility] provider Ms. Placey, NP.    Today while pt was going to catch [facility] Niece to [facility] facility and she was too weak to get on van.    Pt was recently seen in ER for vascular access issue RUE - bleeding, then clotted.  R groin catheter placed and is being used for HD now.    Pt is groggy and globally weak.  She is sleepy, but didn't sleep well last PM b/c she was in pain.  Pain rx recently discontinued for suspected sensitivity.    Patient is a 58 y.o. female presenting with weakness. The history is provided by the patient.  Weakness This is a new problem. The problem occurs constantly. Pertinent negatives include no chest pain, no abdominal pain, no headaches and no shortness of breath. Nothing aggravates the symptoms. Nothing relieves the symptoms. The treatment provided no relief.    Past Medical History  Diagnosis Date  . Hyperparathyroidism, secondary renal   . CAD (coronary artery disease) of artery bypass graft     CABG in 2002  . PAD (peripheral artery disease)     Not a candidate for revascularization  . Chronic constipation   . GERD (gastroesophageal reflux disease)   . Anemia of chronic renal failure   . Tardive dyskinesia     Secondary to Reglan  . Depression   . Neuropathy   . Diabetic retinopathy   . Diastolic CHF, chronic     Grade 2  . Pulmonary embolism 06/2003  . Hyperlipidemia   . Stroke 2002    "w/left side" residual weakness  . Diabetes mellitus     type II  . CHF (congestive  heart failure)   . CVA (cerebral infarction)     right brain with minimal left hemiparesis  . Pneumonia 10/2009; 11/2009    ?pneumonia 01/17/11  . Hypertension   . Arthritis   . Complication of anesthesia     "sleepy afterwards; due to her being dialysis pt"  . Myocardial infarction 2002  . ESRD on dialysis     [name]; M, W, F; via right upper arm AVF (01/18/2012)   Past Surgical History  Procedure Laterality Date  . Coronary artery bypass graft  2002  . Laparoscopic cholecystectomy  2002  . Av fistula placement      times 4-using rt upper arm graft-old rt lower arm,lt upper and lower old grafts  . Trigger finger release    . Tubal ligation    . Eye examination under anesthesia w/ retinal cryotherapy and retinal laser    . Cataract extraction  07/2010    right sided   . Toe amputation  5/12    left 2nd toe secondary to osteomyelitis  . Breast biopsy  06/2010  . Carpal tunnel release  05/05/2011    Procedure: CARPAL TUNNEL RELEASE;  Surgeon: Sharma Covert, MD;  Location: [facility];  Service: Orthopedics;  Laterality: Right;  . Coronary angioplasty with stent placement    . Amputation  11/24/2011    Procedure: AMPUTATION RAY;  Surgeon: Nadara Mustard, MD;  Location: [facility] OR;  Service: Orthopedics;  Laterality: Right;  Right Foot 2nd Ray Amputation  . Amputation  12/12/2011    Procedure: AMPUTATION BELOW KNEE;  Surgeon: Nadara Mustard, MD;  Location: [facility] OR;  Service: Orthopedics;  Laterality: Right;  . 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 Amputation  01/17/2012    Procedure: AMPUTATION ABOVE KNEE;  Surgeon: Nadara Mustard, MD;  Location: [facility] OR;  Service: Orthopedics;  Laterality: Right;  Right Above Knee Amputation  . I&d extremity  02/15/2012    Procedure: IRRIGATION AND DEBRIDEMENT EXTREMITY;  Surgeon: Kathryne Hitch, MD;  Location: [facility] OR;  Service: Orthopedics;  Laterality: Right;   Family History  Problem Relation Age of Onset  . Diabetes  Mother   . Cirrhosis Mother   . Diabetes Father   . Heart attack Father   . Hyperlipidemia Father   . Heart disease Father   . Diabetes Sister   . Hyperlipidemia Sister   . Hypertension Sister   . Diabetes Sister   . Diabetes Sister   . Heart disease Brother   . Hyperlipidemia Brother    History  Substance Use Topics  . Smoking status: Never Smoker   . Smokeless tobacco: Never Used     Comment: 01/18/2012 "quit smoking  marijuana~ 15 yr ago"  . Alcohol Use: No     Comment: 01/18/2012 "occasionally I'll have a mixed drink"   OB History   Grav Para Term Preterm Abortions TAB SAB Ect Mult Living   5 1   4  4   1      Review of Systems  Constitutional: Positive for activity change and fatigue. Negative for fever, chills and diaphoresis.  HENT: Negative.   Eyes: Negative.   Respiratory: Negative.  Negative for choking, chest tightness and shortness of breath.   Cardiovascular: Negative for chest pain.  Gastrointestinal: Positive for nausea. Negative for abdominal pain, diarrhea, constipation, blood in stool and anal bleeding.  Endocrine: Negative.   Genitourinary: Negative.   Musculoskeletal: Negative.   Skin: Negative.   Allergic/Immunologic: Negative.   Neurological: Positive for weakness. Negative for tremors, seizures, syncope, speech difficulty, light-headedness, numbness and headaches.  Hematological: Negative.       Allergies  Lipitor; Codeine; Metoclopramide hcl; Iohexol; and Oxycodone  Home Medications   Current Outpatient Rx  Name  Route  Sig  Dispense  Refill  . aspirin 81 MG tablet   Oral   Take 81 mg by mouth daily.         . cinacalcet (SENSIPAR) 30 MG tablet   Oral   Take 30-90 mg by mouth daily. Pt takes 30 mg in the morning and 90 mg in the morning         . doxycycline (ADOXA) 100 MG tablet   Oral   Take 100 mg by mouth daily. For 30 days. Stop date 04/07/12         . fluticasone (FLONASE) 50 MCG/ACT nasal spray   Nasal   Place 2  sprays into the nose 2 (two) times daily.         Marland Kitchen gabapentin (NEURONTIN) 400 MG capsule   Oral   Take 400 mg by mouth 2 (two) times daily. Pt takes one in the am and one at night         . insulin glargine (LANTUS) 100 UNIT/ML injection   Subcutaneous  Inject 50 Units into the skin at bedtime.         . lidocaine-prilocaine (EMLA) cream   Topical   Apply 1 application topically as needed (on dialysis port).         . multivitamin (RENA-VIT) TABS tablet   Oral   Take 1 tablet by mouth daily.          . nitroGLYCERIN (NITROSTAT) 0.4 MG SL tablet   Sublingual   Place 0.4 mg under the tongue every 5 (five) minutes as needed. For chest pain         . pantoprazole (PROTONIX) 40 MG tablet   Oral   Take 40 mg by mouth daily. Take 30 min before breakfast         . polyvinyl alcohol (LIQUIFILM TEARS) 1.4 % ophthalmic solution   Both Eyes   Place 1 drop into both eyes daily as needed. For dryness         . sevelamer (RENAGEL) 800 MG tablet   Oral   Take 800 mg by mouth daily. Take 3 tabs with meals and 2 tabs with snacks         . sodium chloride (OCEAN) 0.65 % nasal spray   Nasal   Place 2 sprays into the nose 4 (four) times daily as needed. For nasal congestion         . tetrahydrozoline 0.05 % ophthalmic solution   Both Eyes   Place 1 drop into both eyes daily.          BP 103/74  Pulse 82  Temp(Src) 97 F (36.1 C) (Oral)  Resp 17  SpO2 98% Physical Exam  Vitals reviewed. Constitutional: She is oriented to person, place, and time. She appears well-developed and well-nourished.  HENT:  Head: Normocephalic and atraumatic.  Mouth/Throat: Oropharynx is clear and moist. No oropharyngeal exudate.  Eyes: Conjunctivae are normal. Right eye exhibits no discharge. Left eye exhibits no discharge.  Neck: Normal range of motion. Neck supple. No JVD present. No tracheal deviation present.  Cardiovascular: Normal rate and regular rhythm.  Exam reveals no  friction rub.   No murmur heard. Pulmonary/Chest: Effort normal and breath sounds normal. No stridor. No respiratory distress. She has no wheezes. She has no rales. She exhibits no tenderness.  Abdominal: Soft. Bowel sounds are normal. She exhibits no distension. There is no tenderness. There is no rebound and no guarding.  Musculoskeletal: Normal range of motion. She exhibits no edema and no tenderness.  R sided AKA  Neurological: She is alert and oriented to person, place, and time. She has normal reflexes.  Skin: Skin is warm and dry. There is erythema.    ED Course  Procedures (including critical care time) Labs Review Labs Reviewed  CBC WITH DIFFERENTIAL - Abnormal; Notable for the following:    Hemoglobin 11.4 (*)    HCT 35.4 (*)    RDW 16.3 (*)    Monocytes Absolute 1.1 (*)    All other components within normal limits  POCT I-STAT TROPONIN I - Abnormal; Notable for the following:    Troponin i, poc 9.71 (*)    All other components within normal limits  COMPREHENSIVE METABOLIC PANEL  URINALYSIS, ROUTINE W REFLEX MICROSCOPIC   Imaging Review Dg Chest 2 View  04/17/2013   CLINICAL DATA:  Chest pain, shortness of breath.  EXAM: CHEST  2 VIEW  COMPARISON:  April 02, 2013.  FINDINGS: Stable cardiomegaly. Status post coronary artery bypass graft. Distal tips of dialysis  catheter extending from inferior vena cava are unchanged in position within the right atrium. Stable central pulmonary vascular congestion is noted. Bilateral pulmonary edema noted on prior exam does appear to be significantly improved. No pneumothorax or significant pleural effusion is noted.  IMPRESSION: Bilateral pulmonary edema noted on prior exam does appear to be significantly improved, although mild central pulmonary vascular congestion remains.   Electronically Signed   By: Roque Lias M.D.   On: 04/10/2013 12:13   Dg Chest Port 1 View  04/02/2013   CLINICAL DATA:  Status post Diatek catheter placement.   EXAM: PORTABLE CHEST - 1 VIEW  COMPARISON:  Chest radiograph 04/02/2013  FINDINGS: Stable cardiomegaly in this patient status post median sternotomy for CABG. There is diffuse pulmonary vascular congestion. There is diffuse interstitial pulmonary edema throughout both lungs. No visible pleural effusion. Negative for pneumothorax. There has been interval placement of a dialysis catheter via an inferior vena cava approach. The distal leads project over the expected location of the lower right atrium and the upper right atrium. No acute osseous abnormality.  IMPRESSION: 1. Dialysis catheter via an inferior approach terminates in the atrium. 2. Cardiomegaly with diffuse pulmonary vascular congestion and interstitial pulmonary edema.   Electronically Signed   By: Britta Mccreedy M.D.   On: 04/02/2013 16:23   Dg Fluoro Guide Cv Line-no Report  04/02/2013   CLINICAL DATA: diatek   FLOURO GUIDE CV LINE  Fluoroscopy was utilized by the requesting physician.  No radiographic  interpretation.     EKG Interpretation    Date/Time:  Thursday 04/10/2013 11:07:48 EST Ventricular Rate:  85 PR Interval:  214 QRS Duration: 122 QT Interval:  459 QTC Calculation: 546 R Axis:   92 Text Interpretation:  Age not entered, assumed to be  58 years old for purpose of ECG interpretation Sinus or ectopic atrial rhythm Prolonged PR interval RBBB and LPFB Nonspecific T abnormalities, lateral leads Confirmed by [facility]  MD, Labron Bloodgood ([id number]) on 04/10/2013 11:35:13 AM           Repeat EKG unchanged.    CRITICAL CARE Performed by: Redgie Grayer, Arriana Lohmann   Total critical care time: 40  Critical care time was exclusive of separately billable procedures and treating other patients.  Critical care was necessary to treat or prevent imminent or life-threatening deterioration.  Critical care was time spent personally by me on the following activities: development of treatment plan with patient and/or surrogate as well as nursing,  discussions with consultants, evaluation of patient's response to treatment, examination of patient, obtaining history from patient or surrogate, ordering and performing treatments and interventions, ordering and review of laboratory studies, ordering and review of radiographic studies, pulse oximetry and re-evaluation of patient's condition.   MDM   Final diagnoses:  NSTEMI (non-ST elevated myocardial infarction)  Diabetes mellitus, type 2  ESRD (end stage renal disease) on dialysis   58 yo AA female with MMP presents to ER with cc of global weakness.  ER Course: AFVSS.  Istat Troponin elevated at 9.71.  EKG x 2 w/ RBBB and NSTW STS changes.  CMP pending.  Will add istat chem.  CXR with central pulmonary vascular congestion.    STAT page to Cardiology upon receiving notification of elevated Troponin.  Hold on [facility] vs [facility] pending d/w cardiology.  Of note, Pt is CP free in ER and has not reported CP recently.    1:29 PM Discussed case with cardiology who will see in ER.  He requests  heparin per pharmacy.  Pt updated.    3:29 PM Pt pending admission to cardiology.  VSS.    4:44 PM Prolonged ER stay pending cardiology evaluation and disposition.  Formal troponin elevated at 7.91.  Pt to be admitted.  Pt t/o to Dr. Rubin Payor pending admission.  VSS and PT in nad.  Darlys Gales, MD 03/31/2013 [phone]

## 2013-04-04 NOTE — ED Notes (Signed)
CAlled IV team to start IV and obtain labs from site

## 2013-04-04 NOTE — ED Notes (Signed)
Gave pt Malaw[place]urkey sandwich and ginger ale- ok per cardiology.

## 2013-04-04 NOTE — H&P (Signed)
Reason for Consult: abnormal troponin   Referring Physician: ER Physician PCP: Thane Edu, MD Primary Cardiologist: Dr. Tessa Lerner Faith [name] is an 58 y.o. female.    Chief Complaint: weakness could not do much   HPI:  58 yo AA female with a history of CAD with CABG in 2002(left internal mammary to the left anterior descending coronary artery, reversed saphenous vein graft to the distal right coronary artery, reversed saphenous vein graft to the obtuse marginal coronary artery), grade 2 diastolic dysfunction, EF of 60-65% by echo 11/17/09, ESRD, PAD, DM2, CVA, HLD, secondary hyperparathyroidism. Dr. Clarene Duke cathed her in Jan 2011 which showed patent grafts and an 80% long, tubular AV groove Circ lesion not amendable to intervention.   Other hx of ESRD with HD, PVD with Rt. AKA.  Last seen 12/2010 with troponin 0.74 and normal ckmb.   Today she presented to ER with weakness after placement of new AV graft and removal of old graft, some bleeding post procedure.  Pt has not felt well since that time with decreased appetite, not sleeping well.  Today so fatigued she could not catch the Bus for appt.  She did have dialysis yesterday.    Troponin marker 9.71.  EKG RBBB with lat t wave ischemia.  She has had no chest pain, no SOB only fatigue.  Awaiting other labs.      Past Medical History  Diagnosis Date  . Hyperparathyroidism, secondary renal   . CAD (coronary artery disease) of artery bypass graft     CABG in 2002  . PAD (peripheral artery disease)     Not a candidate for revascularization  . Chronic constipation   . GERD (gastroesophageal reflux disease)   . Anemia of chronic renal failure   . Tardive dyskinesia     Secondary to Reglan  . Depression   . Neuropathy   . Diabetic retinopathy   . Diastolic CHF, chronic     Grade 2  . Pulmonary embolism 06/2003  . Hyperlipidemia   . Stroke 2002    "w/left side" residual weakness  . Diabetes mellitus    type II  . CHF (congestive heart failure)   . CVA (cerebral infarction)     right brain with minimal left hemiparesis  . Pneumonia 10/2009; 11/2009    ?pneumonia 01/17/11  . Hypertension   . Arthritis   . Complication of anesthesia     "sleepy afterwards; due to her being dialysis pt"  . Myocardial infarction 2002  . ESRD on dialysis     [name]; M, W, F; via right upper arm AVF (01/18/2012)    Past Surgical History  Procedure Laterality Date  . Coronary artery bypass graft  2002  . Laparoscopic cholecystectomy  2002  . Av fistula placement      times 4-using rt upper arm graft-old rt lower arm,lt upper and lower old grafts  . Trigger finger release    . Tubal ligation    . Eye examination under anesthesia w/ retinal cryotherapy and retinal laser    . Cataract extraction  07/2010    right sided   . Toe amputation  5/12    left 2nd toe secondary to osteomyelitis  . Breast biopsy  06/2010  . Carpal tunnel release  05/05/2011    Procedure: CARPAL TUNNEL RELEASE;  Surgeon: Sharma Covert, MD;  Location: [facility];  Service: Orthopedics;  Laterality: Right;  . Coronary angioplasty with  stent placement    . Amputation  11/24/2011    Procedure: AMPUTATION RAY;  Surgeon: Nadara Mustard, MD;  Location: [facility] OR;  Service: Orthopedics;  Laterality: Right;  Right Foot 2nd Ray Amputation  . Amputation  12/12/2011    Procedure: AMPUTATION BELOW KNEE;  Surgeon: Nadara Mustard, MD;  Location: [facility] OR;  Service: Orthopedics;  Laterality: Right;  . 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 Amputation  01/17/2012    Procedure: AMPUTATION ABOVE KNEE;  Surgeon: Nadara Mustard, MD;  Location: [facility] OR;  Service: Orthopedics;  Laterality: Right;  Right Above Knee Amputation  . I&d extremity  02/15/2012    Procedure: IRRIGATION AND DEBRIDEMENT EXTREMITY;  Surgeon: Kathryne Hitch, MD;  Location: [facility] OR;  Service: Orthopedics;  Laterality: Right;    Family History  Problem  Relation Age of Onset  . Diabetes Mother   . Cirrhosis Mother   . Diabetes Father   . Heart attack Father   . Hyperlipidemia Father   . Heart disease Father   . Diabetes Sister   . Hyperlipidemia Sister   . Hypertension Sister   . Diabetes Sister   . Diabetes Sister   . Heart disease Brother   . Hyperlipidemia Brother    Social History:  reports that she has never smoked. She has never used smokeless tobacco. She reports that she does not drink alcohol or use illicit drugs.  Allergies:  Allergies  Allergen Reactions  . Lipitor [Atorvastatin Calcium] Other (See Comments)    Muscle weakness  . Codeine Nausea And Vomiting  . Metoclopramide Hcl Other (See Comments)    Tardive dyskinesia  . Iohexol Itching    Facility where pt lives does not have this listed as an allergy and she has not been pre-medicated in the past  . Oxycodone Hypertension    OUTPATIENT MEDICATIONS: ASA 81 mg daily Sensipar 30 mg in am and 90 mg pm flonase prn neurontin 400 mg BID lantus 50 units at hs emla cream as needed Rena-vit 1 daily NTG sl PRN protonix 40 mg daily Liquifilm tears 1.4% eye drops daily as needed renagel 800 mg 3 tabs with meals and 2 tabs with snacks Tetrahydrozoline 0.05% eye sol 1 drop both eyes daily  Results for orders placed during the hospital encounter of 04/03/2013 (from the past 48 hour(s))  CBC WITH DIFFERENTIAL     Status: Abnormal   Collection Time    04/02/2013 12:22 PM      Result Value Ref Range   WBC 10.2  4.0 - 10.5 K/uL   RBC 4.15  3.87 - 5.11 MIL/uL   Hemoglobin 11.4 (*) 12.0 - 15.0 g/dL   HCT 16.1 (*) 09.6 - 04.5 %   MCV 85.3  78.0 - 100.0 fL   MCH 27.5  26.0 - 34.0 pg   MCHC 32.2  30.0 - 36.0 g/dL   RDW 40.9 (*) 81.1 - 91.4 %   Platelets 151  150 - 400 K/uL   Neutrophils Relative % 73  43 - 77 %   Neutro Abs 7.4  1.7 - 7.7 K/uL   Lymphocytes Relative 17  12 - 46 %   Lymphs Abs 1.7  0.7 - 4.0 K/uL   Monocytes Relative 11  3 - 12 %   Monocytes Absolute  1.1 (*) 0.1 - 1.0 K/uL   Eosinophils Relative 0  0 - 5 %   Eosinophils Absolute 0.0  0.0 - 0.7 K/uL  Basophils Relative 0  0 - 1 %   Basophils Absolute 0.0  0.0 - 0.1 K/uL  POCT I-STAT TROPONIN I     Status: Abnormal   Collection Time    03/30/2013 12:37 PM      Result Value Ref Range   Troponin i, poc 9.71 (*) 0.00 - 0.08 ng/mL   Comment NOTIFIED PHYSICIAN     Comment 3       Comment: Due to the release kinetics of cTnI, a negative result within the first hours of the onset of symptoms does not rule out myocardial infarction with certainty. If myocardial infarction is still suspected, repeat the test at appropriate intervals.   POC Troponin I       Comment: Results called to HD and shown to dr Redgie GrayerMasneri   Dg Chest 2 View  04/18/2013   CLINICAL DATA:  Chest pain, shortness of breath.  EXAM: CHEST  2 VIEW  COMPARISON:  April 02, 2013.  FINDINGS: Stable cardiomegaly. Status post coronary artery bypass graft. Distal tips of dialysis catheter extending from inferior vena cava are unchanged in position within the right atrium. Stable central pulmonary vascular congestion is noted. Bilateral pulmonary edema noted on prior exam does appear to be significantly improved. No pneumothorax or significant pleural effusion is noted.  IMPRESSION: Bilateral pulmonary edema noted on prior exam does appear to be significantly improved, although mild central pulmonary vascular congestion remains.   Electronically Signed   By: Roque LiasJames  Green M.D.   On: 03/29/2013 12:13   Dg Chest Port 1 View  04/02/2013   CLINICAL DATA:  Status post Diatek catheter placement.  EXAM: PORTABLE CHEST - 1 VIEW  COMPARISON:  Chest radiograph 04/02/2013  FINDINGS: Stable cardiomegaly in this patient status post median sternotomy for CABG. There is diffuse pulmonary vascular congestion. There is diffuse interstitial pulmonary edema throughout both lungs. No visible pleural effusion. Negative for pneumothorax. There has been interval  placement of a dialysis catheter via an inferior vena cava approach. The distal leads project over the expected location of the lower right atrium and the upper right atrium. No acute osseous abnormality.  IMPRESSION: 1. Dialysis catheter via an inferior approach terminates in the atrium. 2. Cardiomegaly with diffuse pulmonary vascular congestion and interstitial pulmonary edema.   Electronically Signed   By: Britta MccreedySusan  Turner M.D.   On: 04/02/2013 16:23   Dg Fluoro Guide Cv Line-no Report  04/02/2013   CLINICAL DATA: diatek   FLOURO GUIDE CV LINE  Fluoroscopy was utilized by the requesting physician.  No radiographic  interpretation.     ROS: General:no colds or fevers, no weight changes Skin:no rashes or ulcers HEENT:no blurred vision, no congestion CV:see HPI PUL:see HPI GI:no diarrhea constipation or melena, no indigestion GU:no hematuria, no dysuria MS:no joint pain, no claudication Neuro:no syncope, no lightheadedness Endo:+ diabetes, no thyroid disease   Blood pressure 125/68, pulse 82, temperature 97 F (36.1 C), temperature source Oral, resp. rate 13, height 5\' 4"  (1.626 m), weight 169 lb 12.1 oz (77 kg), SpO2 100.00%. PE: General:Pleasant affect, NAD, though difficult to arouse, she cannot roll in bed without help. Skin:Warm and dry, brisk capillary refill HEENT:normocephalic, sclera clear, mucus membranes moist Neck:supple, no JVD, no bruits  Heart:S1S2 RRR without murmur, gallup, rub or click Lungs:clear without rales, rhonchi, or wheezes ZOX:WRUEAbd:soft, non tender, + BS, do not palpate liver spleen or masses Ext:no Lt lower ext edema, Rt AKA,  2+ radial pulses Neuro:alert and oriented, MAE, follows commands, + facial  symmetry, freq moving tongue in and out.    Assessment/Plan Principal Problem:   NSTEMI (non-ST elevated myocardial infarction), Troponin 9.75 Active Problems:   ESRD (end stage renal disease) on dialysis   Diabetes mellitus, type 2   CAD (coronary artery disease)  of artery bypass graft   Tardive dyskinesia  Plan: if Troponin, CKMB positive plan for cath tomorrow.  If negative we would ask [facility] to admit and we will follow.  She is HD on MWF, will need renal consult.  IV heparin started. Follow enzymes.    [facility]  Nurse Practitioner Certified [facility] Pager [phone] or after 5pm or weekends call [phone] 2013-04-24, 2:09 PM     Agree with note written by Nada Boozer RNP  Pt well known to our practice with H/O remote CAD s/p CABG 2004, cath 2011 with patent grafts. CRI on HD. S/P RBKA. Other probs as outlined. Pt recently had AVF revised earlier this week and noticed that the site was oozing. She specifically denied CP/SOB. She did C/O lethargy. We were asked to see because of trop (POCM).  Exam benign. EKG w/o acute changes (lat TWI old). Not sure why her trop is elevated. Will re check serum trop. If still elevated will need re cath. If nl will ask [facility] to assume care. Start iv hep.  Faith [name] 2013/04/24 3:13 PM

## 2013-04-04 NOTE — ED Notes (Signed)
[facility] report [facility]. Pt bled on Monday from right fistula site- seen in ED. Fistula clotted off, Tues thrombectomy- unsuccessful. Thrombectomy and revision of gortex graft in right arm - diatec catheter placed in left groin. Discharged on Oxycodone 10mg , pt took two separate doses. Pt woke up yesterday feeling nauseated, weak, overmedicated. Seen [facility]- pt hypotensive. Rehydrated pt and discharged improved condition and went home (BP still 90/50). Pt presents with same symptoms today- felt like she was so weak she might pass out.

## 2013-04-05 ENCOUNTER — Inpatient Hospital Stay (HOSPITAL_COMMUNITY): Payer: Medicare (Managed Care) | Admitting: Anesthesiology

## 2013-04-05 ENCOUNTER — Encounter (HOSPITAL_COMMUNITY): Payer: Medicare (Managed Care) | Admitting: Anesthesiology

## 2013-04-05 ENCOUNTER — Encounter (HOSPITAL_COMMUNITY)
Admission: EM | Disposition: E | Payer: Medicare (Managed Care) | Source: Home / Self Care | Attending: Pulmonary Disease

## 2013-04-05 ENCOUNTER — Inpatient Hospital Stay (HOSPITAL_COMMUNITY): Payer: Medicare (Managed Care)

## 2013-04-05 ENCOUNTER — Encounter (HOSPITAL_COMMUNITY): Admission: EM | Disposition: E | Payer: Self-pay | Source: Home / Self Care | Attending: Pulmonary Disease

## 2013-04-05 DIAGNOSIS — R570 Cardiogenic shock: Secondary | ICD-10-CM

## 2013-04-05 DIAGNOSIS — J9600 Acute respiratory failure, unspecified whether with hypoxia or hypercapnia: Secondary | ICD-10-CM

## 2013-04-05 DIAGNOSIS — I6350 Cerebral infarction due to unspecified occlusion or stenosis of unspecified cerebral artery: Secondary | ICD-10-CM

## 2013-04-05 DIAGNOSIS — I200 Unstable angina: Secondary | ICD-10-CM

## 2013-04-05 HISTORY — PX: LEFT HEART CATHETERIZATION WITH CORONARY/GRAFT ANGIOGRAM: SHX5450

## 2013-04-05 LAB — POCT I-STAT 3, ART BLOOD GAS (G3+)
ACID-BASE DEFICIT: 14 mmol/L — AB (ref 0.0–2.0)
Acid-base deficit: 15 mmol/L — ABNORMAL HIGH (ref 0.0–2.0)
Acid-base deficit: 12 mmol/L — ABNORMAL HIGH (ref 0.0–2.0)
Acid-base deficit: 7 mmol/L — ABNORMAL HIGH (ref 0.0–2.0)
Bicarbonate: 13.1 mEq/L — ABNORMAL LOW (ref 20.0–24.0)
Bicarbonate: 14.5 mEq/L — ABNORMAL LOW (ref 20.0–24.0)
Bicarbonate: 17.7 mEq/L — ABNORMAL LOW (ref 20.0–24.0)
Bicarbonate: 13.3 mEq/L — ABNORMAL LOW (ref 20.0–24.0)
O2 SAT: 100 %
O2 SAT: 98 %
O2 Saturation: 96 %
O2 Saturation: 95 %
PCO2 ART: 37.9 mmHg (ref 35.0–45.0)
PCO2 ART: 34.6 mmHg — AB (ref 35.0–45.0)
PH ART: 7.145 — AB (ref 7.350–7.450)
PH ART: 7.228 — AB (ref 7.350–7.450)
PH ART: 7.336 — AB (ref 7.350–7.450)
PO2 ART: 301 mmHg — AB (ref 80.0–100.0)
PO2 ART: 125 mmHg — AB (ref 80.0–100.0)
Patient temperature: 97.7
Patient temperature: 97.8
TCO2: 14 mmol/L (ref 0–100)
TCO2: 16 mmol/L (ref 0–100)
TCO2: 19 mmol/L (ref 0–100)
TCO2: 14 mmol/L (ref 0–100)
pCO2 arterial: 33 mmHg — ABNORMAL LOW (ref 35.0–45.0)
pCO2 arterial: 33.6 mmHg — ABNORMAL LOW (ref 35.0–45.0)
pH, Arterial: 7.202 — ABNORMAL LOW (ref 7.350–7.450)
pO2, Arterial: 101 mmHg — ABNORMAL HIGH (ref 80.0–100.0)
pO2, Arterial: 77 mmHg — ABNORMAL LOW (ref 80.0–100.0)

## 2013-04-05 LAB — BASIC METABOLIC PANEL
BUN: 18 mg/dL (ref 6–23)
BUN: 25 mg/dL — AB (ref 6–23)
CALCIUM: 8 mg/dL — AB (ref 8.4–10.5)
CO2: 24 meq/L (ref 19–32)
CO2: 20 mEq/L (ref 19–32)
Calcium: 8 mg/dL — ABNORMAL LOW (ref 8.4–10.5)
Chloride: 96 mEq/L (ref 96–112)
Chloride: 93 mEq/L — ABNORMAL LOW (ref 96–112)
Creatinine, Ser: 3.98 mg/dL — ABNORMAL HIGH (ref 0.50–1.10)
Creatinine, Ser: 4.89 mg/dL — ABNORMAL HIGH (ref 0.50–1.10)
GFR calc Af Amer: 10 mL/min — ABNORMAL LOW (ref >90)
GFR calc non Af Amer: 11 mL/min — ABNORMAL LOW (ref >90)
GFR, EST AFRICAN AMERICAN: 13 mL/min — AB (ref >90)
GFR, EST NON AFRICAN AMERICAN: 9 mL/min — AB (ref >90)
Glucose, Bld: 60 mg/dL — ABNORMAL LOW (ref 70–99)
Glucose, Bld: 176 mg/dL — ABNORMAL HIGH (ref 70–99)
POTASSIUM: 4.6 meq/L (ref 3.7–5.3)
Potassium: 3.2 mEq/L — ABNORMAL LOW (ref 3.7–5.3)
SODIUM: 140 meq/L (ref 137–147)
Sodium: 141 mEq/L (ref 137–147)

## 2013-04-05 LAB — COMPREHENSIVE METABOLIC PANEL
ALBUMIN: 3.4 g/dL — AB (ref 3.5–5.2)
ALK PHOS: 231 U/L — AB (ref 39–117)
ALT: 41 U/L — AB (ref 0–35)
AST: 295 U/L — ABNORMAL HIGH (ref 0–37)
BILIRUBIN TOTAL: 0.6 mg/dL (ref 0.3–1.2)
BUN: 23 mg/dL (ref 6–23)
CHLORIDE: 94 meq/L — AB (ref 96–112)
CO2: 11 mEq/L — ABNORMAL LOW (ref 19–32)
Calcium: 8.8 mg/dL (ref 8.4–10.5)
Creatinine, Ser: 4.53 mg/dL — ABNORMAL HIGH (ref 0.50–1.10)
GFR calc Af Amer: 11 mL/min — ABNORMAL LOW (ref >90)
GFR calc non Af Amer: 10 mL/min — ABNORMAL LOW (ref >90)
Glucose, Bld: 210 mg/dL — ABNORMAL HIGH (ref 70–99)
POTASSIUM: 4.4 meq/L (ref 3.7–5.3)
SODIUM: 135 meq/L — AB (ref 137–147)
Total Protein: 7.6 g/dL (ref 6.0–8.3)

## 2013-04-05 LAB — CBC
HCT: 32.5 % — ABNORMAL LOW (ref 36.0–46.0)
HCT: 36.7 % (ref 36.0–46.0)
HCT: 32.3 % — ABNORMAL LOW (ref 36.0–46.0)
HEMOGLOBIN: 10.5 g/dL — AB (ref 12.0–15.0)
HEMOGLOBIN: 11.4 g/dL — AB (ref 12.0–15.0)
Hemoglobin: 10 g/dL — ABNORMAL LOW (ref 12.0–15.0)
MCH: 27.3 pg (ref 26.0–34.0)
MCH: 27.3 pg (ref 26.0–34.0)
MCH: 27.1 pg (ref 26.0–34.0)
MCHC: 32.3 g/dL (ref 30.0–36.0)
MCHC: 31.1 g/dL (ref 30.0–36.0)
MCHC: 31 g/dL (ref 30.0–36.0)
MCV: 84.6 fL (ref 78.0–100.0)
MCV: 87.8 fL (ref 78.0–100.0)
MCV: 87.5 fL (ref 78.0–100.0)
Platelets: 135 10*3/uL — ABNORMAL LOW (ref 150–400)
Platelets: 111 10*3/uL — ABNORMAL LOW (ref 150–400)
Platelets: 124 10*3/uL — ABNORMAL LOW (ref 150–400)
RBC: 3.84 MIL/uL — AB (ref 3.87–5.11)
RBC: 4.18 MIL/uL (ref 3.87–5.11)
RBC: 3.69 MIL/uL — ABNORMAL LOW (ref 3.87–5.11)
RDW: 16.3 % — ABNORMAL HIGH (ref 11.5–15.5)
RDW: 16.9 % — ABNORMAL HIGH (ref 11.5–15.5)
RDW: 16.6 % — AB (ref 11.5–15.5)
WBC: 10 10*3/uL (ref 4.0–10.5)
WBC: 12.6 10*3/uL — AB (ref 4.0–10.5)
WBC: 14.1 10*3/uL — ABNORMAL HIGH (ref 4.0–10.5)

## 2013-04-05 LAB — RENAL FUNCTION PANEL
Albumin: 3.6 g/dL (ref 3.5–5.2)
BUN: 18 mg/dL (ref 6–23)
CHLORIDE: 95 meq/L — AB (ref 96–112)
CO2: 26 mEq/L (ref 19–32)
Calcium: 8.2 mg/dL — ABNORMAL LOW (ref 8.4–10.5)
Creatinine, Ser: 4.06 mg/dL — ABNORMAL HIGH (ref 0.50–1.10)
GFR calc Af Amer: 13 mL/min — ABNORMAL LOW (ref >90)
GFR, EST NON AFRICAN AMERICAN: 11 mL/min — AB (ref >90)
Glucose, Bld: 59 mg/dL — ABNORMAL LOW (ref 70–99)
POTASSIUM: 3.2 meq/L — AB (ref 3.7–5.3)
Phosphorus: 3 mg/dL (ref 2.3–4.6)
Sodium: 138 mEq/L (ref 137–147)

## 2013-04-05 LAB — POCT I-STAT, CHEM 8
BUN: 22 mg/dL (ref 6–23)
BUN: 24 mg/dL — ABNORMAL HIGH (ref 6–23)
CHLORIDE: 100 meq/L (ref 96–112)
Calcium, Ion: 1.18 mmol/L (ref 1.12–1.23)
Calcium, Ion: 0.92 mmol/L — ABNORMAL LOW (ref 1.12–1.23)
Chloride: 104 mEq/L (ref 96–112)
Creatinine, Ser: 4.6 mg/dL — ABNORMAL HIGH (ref 0.50–1.10)
Creatinine, Ser: 4.6 mg/dL — ABNORMAL HIGH (ref 0.50–1.10)
Glucose, Bld: 168 mg/dL — ABNORMAL HIGH (ref 70–99)
Glucose, Bld: 170 mg/dL — ABNORMAL HIGH (ref 70–99)
HCT: 36 % (ref 36.0–46.0)
HEMATOCRIT: 40 % (ref 36.0–46.0)
HEMOGLOBIN: 13.6 g/dL (ref 12.0–15.0)
Hemoglobin: 12.2 g/dL (ref 12.0–15.0)
POTASSIUM: 4.7 meq/L (ref 3.7–5.3)
Potassium: 4 mEq/L (ref 3.7–5.3)
SODIUM: 136 meq/L — AB (ref 137–147)
SODIUM: 140 meq/L (ref 137–147)
TCO2: 13 mmol/L (ref 0–100)
TCO2: 17 mmol/L (ref 0–100)

## 2013-04-05 LAB — TYPE AND SCREEN
ABO/RH(D): AB POS
Antibody Screen: NEGATIVE

## 2013-04-05 LAB — HEPARIN LEVEL (UNFRACTIONATED)
HEPARIN UNFRACTIONATED: 0.36 [IU]/mL (ref 0.30–0.70)
HEPARIN UNFRACTIONATED: 0.15 [IU]/mL — AB (ref 0.30–0.70)
Heparin Unfractionated: >2.2 IU/mL — ABNORMAL HIGH (ref 0.30–0.70)

## 2013-04-05 LAB — LIPID PANEL
Cholesterol: 118 mg/dL (ref 0–200)
HDL: 42 mg/dL (ref >39)
LDL Cholesterol: 54 mg/dL (ref 0–99)
Total CHOL/HDL Ratio: 2.8 RATIO
Triglycerides: 108 mg/dL (ref <150)
VLDL: 22 mg/dL (ref 0–40)

## 2013-04-05 LAB — CARBOXYHEMOGLOBIN
Carboxyhemoglobin: 1.6 % — ABNORMAL HIGH (ref 0.5–1.5)
METHEMOGLOBIN: 1.6 % — AB (ref 0.0–1.5)
O2 Saturation: 78.8 %
Total hemoglobin: 11.1 g/dL — ABNORMAL LOW (ref 12.0–16.0)

## 2013-04-05 LAB — PROCALCITONIN: Procalcitonin: 2.71 ng/mL

## 2013-04-05 LAB — PROTIME-INR
INR: 6.27 — AB (ref 0.00–1.49)
INR: 1.84 — ABNORMAL HIGH (ref 0.00–1.49)
INR: 1.89 — ABNORMAL HIGH (ref 0.00–1.49)
PROTHROMBIN TIME: 52.8 s — AB (ref 11.6–15.2)
PROTHROMBIN TIME: 21.1 s — AB (ref 11.6–15.2)
Prothrombin Time: 20.7 seconds — ABNORMAL HIGH (ref 11.6–15.2)

## 2013-04-05 LAB — HEMOGLOBIN A1C
Hgb A1c MFr Bld: 10.7 % — ABNORMAL HIGH (ref <5.7)
Mean Plasma Glucose: 260 mg/dL — ABNORMAL HIGH (ref <117)

## 2013-04-05 LAB — GLUCOSE, CAPILLARY
GLUCOSE-CAPILLARY: 72 mg/dL (ref 70–99)
GLUCOSE-CAPILLARY: 76 mg/dL (ref 70–99)
GLUCOSE-CAPILLARY: 133 mg/dL — AB (ref 70–99)
Glucose-Capillary: 125 mg/dL — ABNORMAL HIGH (ref 70–99)
Glucose-Capillary: 144 mg/dL — ABNORMAL HIGH (ref 70–99)
Glucose-Capillary: 169 mg/dL — ABNORMAL HIGH (ref 70–99)
Glucose-Capillary: 69 mg/dL — ABNORMAL LOW (ref 70–99)
Glucose-Capillary: 76 mg/dL (ref 70–99)

## 2013-04-05 LAB — TROPONIN I
TROPONIN I: 10.26 ng/mL — AB (ref <0.30)
TROPONIN I: 8.78 ng/mL — AB (ref <0.30)

## 2013-04-05 LAB — ABO/RH: ABO/RH(D): AB POS

## 2013-04-05 LAB — LACTIC ACID, PLASMA: LACTIC ACID, VENOUS: 10.8 mmol/L — AB (ref 0.5–2.2)

## 2013-04-05 SURGERY — LEFT HEART CATHETERIZATION WITH CORONARY/GRAFT ANGIOGRAM
Anesthesia: LOCAL

## 2013-04-05 MED ORDER — DOPAMINE-DEXTROSE 3.2-5 MG/ML-% IV SOLN
2.0000 ug/kg/min | INTRAVENOUS | Status: DC
  Administered 2013-04-05: 5 ug/kg/min via INTRAVENOUS

## 2013-04-05 MED ORDER — PRISMASOL BGK 4/2.5 32-4-2.5 MEQ/L IV SOLN
INTRAVENOUS | Status: DC
  Administered 2013-04-05: 22:00:00 via INTRAVENOUS_CENTRAL
  Filled 2013-04-05 (×3): qty 5000

## 2013-04-05 MED ORDER — SUCCINYLCHOLINE CHLORIDE 20 MG/ML IJ SOLN
INTRAMUSCULAR | Status: DC | PRN
  Administered 2013-04-05: 120 mg via INTRAVENOUS

## 2013-04-05 MED ORDER — SODIUM BICARBONATE 8.4 % IV SOLN
INTRAVENOUS | Status: AC
  Filled 2013-04-05: qty 50

## 2013-04-05 MED ORDER — HEPARIN SODIUM (PORCINE) 1000 UNIT/ML DIALYSIS
1000.0000 [IU] | INTRAMUSCULAR | Status: DC | PRN
  Administered 2013-04-07: 7000 [IU] via INTRAVENOUS_CENTRAL
  Filled 2013-04-05: qty 3
  Filled 2013-04-05: qty 6
  Filled 2013-04-05: qty 4

## 2013-04-05 MED ORDER — BIOTENE DRY MOUTH MT LIQD
15.0000 mL | Freq: Two times a day (BID) | OROMUCOSAL | Status: DC
  Administered 2013-04-05 (×2): 15 mL via OROMUCOSAL

## 2013-04-05 MED ORDER — VASOPRESSIN 20 UNIT/ML IJ SOLN
0.0300 [IU]/min | INTRAVENOUS | Status: DC
  Administered 2013-04-05: 0.03 [IU]/min via INTRAVENOUS
  Filled 2013-04-05: qty 2.5

## 2013-04-05 MED ORDER — BIOTENE DRY MOUTH MT LIQD
15.0000 mL | Freq: Four times a day (QID) | OROMUCOSAL | Status: DC
  Administered 2013-04-06 – 2013-04-11 (×22): 15 mL via OROMUCOSAL

## 2013-04-05 MED ORDER — GLUCOSE 40 % PO GEL
ORAL | Status: AC
  Administered 2013-04-05: 37.5 g
  Filled 2013-04-05: qty 1

## 2013-04-05 MED ORDER — SODIUM BICARBONATE 8.4 % IV SOLN
INTRAVENOUS | Status: AC
  Administered 2013-04-05: 17:00:00
  Filled 2013-04-05: qty 100

## 2013-04-05 MED ORDER — SODIUM CHLORIDE 0.9 % IV SOLN
1.0000 mg/h | INTRAVENOUS | Status: DC
  Administered 2013-04-05 – 2013-04-06 (×2): 2 mg/h via INTRAVENOUS
  Administered 2013-04-06: 1 mg/h via INTRAVENOUS
  Filled 2013-04-05 (×5): qty 10

## 2013-04-05 MED ORDER — HEPARIN (PORCINE) IN NACL 100-0.45 UNIT/ML-% IJ SOLN
1200.0000 [IU]/h | INTRAMUSCULAR | Status: DC
  Administered 2013-04-05: 750 [IU]/h via INTRAVENOUS
  Administered 2013-04-06 (×2): 1000 [IU]/h via INTRAVENOUS
  Administered 2013-04-06: 900 [IU]/h via INTRAVENOUS
  Administered 2013-04-07 – 2013-04-09 (×2): 1000 [IU]/h via INTRAVENOUS
  Administered 2013-04-09: 1150 [IU]/h via INTRAVENOUS
  Administered 2013-04-10: 1250 [IU]/h via INTRAVENOUS
  Filled 2013-04-05 (×12): qty 250

## 2013-04-05 MED ORDER — STERILE WATER FOR INJECTION IV SOLN
INTRAVENOUS | Status: DC
  Administered 2013-04-05 – 2013-04-06 (×6): via INTRAVENOUS_CENTRAL
  Filled 2013-04-05 (×14): qty 150

## 2013-04-05 MED ORDER — DEXTROSE 50 % IV SOLN
1.0000 | Freq: Once | INTRAVENOUS | Status: AC
  Administered 2013-04-05: 50 mL via INTRAVENOUS

## 2013-04-05 MED ORDER — PIPERACILLIN-TAZOBACTAM IN DEX 2-0.25 GM/50ML IV SOLN
2.2500 g | Freq: Four times a day (QID) | INTRAVENOUS | Status: DC
  Administered 2013-04-05 – 2013-04-06 (×2): 2.25 g via INTRAVENOUS
  Filled 2013-04-05 (×4): qty 50

## 2013-04-05 MED ORDER — ALTEPLASE 2 MG IJ SOLR
2.0000 mg | Freq: Once | INTRAMUSCULAR | Status: AC | PRN
  Filled 2013-04-05: qty 2

## 2013-04-05 MED ORDER — FENTANYL CITRATE 0.05 MG/ML IJ SOLN
INTRAMUSCULAR | Status: AC
  Administered 2013-04-05: 100 ug
  Filled 2013-04-05: qty 4

## 2013-04-05 MED ORDER — NOREPINEPHRINE BITARTRATE 1 MG/ML IJ SOLN
4.0000 ug/min | INTRAVENOUS | Status: DC
  Administered 2013-04-05: 4 ug/min via INTRAVENOUS
  Filled 2013-04-05: qty 4

## 2013-04-05 MED ORDER — SODIUM CHLORIDE 0.9 % IV SOLN
INTRAVENOUS | Status: DC
  Administered 2013-04-05 – 2013-04-09 (×2): via INTRAVENOUS

## 2013-04-05 MED ORDER — SODIUM BICARBONATE 8.4 % IV SOLN
50.0000 meq | Freq: Once | INTRAVENOUS | Status: AC
  Administered 2013-04-05: 50 meq via INTRAVENOUS

## 2013-04-05 MED ORDER — CHLORHEXIDINE GLUCONATE 0.12 % MT SOLN
15.0000 mL | Freq: Two times a day (BID) | OROMUCOSAL | Status: DC
  Administered 2013-04-05 – 2013-04-11 (×12): 15 mL via OROMUCOSAL
  Filled 2013-04-05 (×12): qty 15

## 2013-04-05 MED ORDER — FAMOTIDINE IN NACL 20-0.9 MG/50ML-% IV SOLN
INTRAVENOUS | Status: AC
  Filled 2013-04-05: qty 50

## 2013-04-05 MED ORDER — DOBUTAMINE IN D5W 4-5 MG/ML-% IV SOLN
INTRAVENOUS | Status: AC
  Administered 2013-04-05: 1000000 ug
  Filled 2013-04-05: qty 250

## 2013-04-05 MED ORDER — NOREPINEPHRINE BITARTRATE 1 MG/ML IJ SOLN
INTRAMUSCULAR | Status: AC
Start: 2013-04-05 — End: 2013-04-05
  Filled 2013-04-05: qty 4

## 2013-04-05 MED ORDER — SODIUM BICARBONATE 8.4 % IV SOLN
50.0000 meq | Freq: Once | INTRAVENOUS | Status: AC
  Administered 2013-04-05: 50 meq via INTRAVENOUS
  Filled 2013-04-05: qty 50

## 2013-04-05 MED ORDER — SODIUM CHLORIDE 0.9 % IV BOLUS (SEPSIS): 500.0000 mL | INTRAVENOUS | Status: AC

## 2013-04-05 MED ORDER — NOREPINEPHRINE BITARTRATE 1 MG/ML IJ SOLN
2.0000 ug/min | INTRAVENOUS | Status: DC
  Administered 2013-04-05: 25 ug/min via INTRAVENOUS
  Administered 2013-04-07: 1 ug/min via INTRAVENOUS
  Filled 2013-04-05 (×2): qty 16

## 2013-04-05 MED ORDER — SODIUM BICARBONATE 8.4 % IV SOLN
INTRAVENOUS | Status: DC
  Administered 2013-04-05 – 2013-04-06 (×2): via INTRAVENOUS
  Filled 2013-04-05 (×4): qty 150

## 2013-04-05 MED ORDER — VANCOMYCIN HCL 10 G IV SOLR
1500.0000 mg | Freq: Once | INTRAVENOUS | Status: AC
  Administered 2013-04-05: 1500 mg via INTRAVENOUS
  Filled 2013-04-05: qty 1500

## 2013-04-05 MED ORDER — HYDROCORTISONE NA SUCCINATE PF 100 MG IJ SOLR
100.0000 mg | Freq: Three times a day (TID) | INTRAMUSCULAR | Status: DC
  Administered 2013-04-05 – 2013-04-06 (×2): 100 mg via INTRAVENOUS
  Filled 2013-04-05 (×5): qty 2

## 2013-04-05 MED ORDER — HEPARIN (PORCINE) IN NACL 2-0.9 UNIT/ML-% IJ SOLN
INTRAMUSCULAR | Status: AC
  Filled 2013-04-05: qty 1000

## 2013-04-05 MED ORDER — PRISMASOL BGK 4/2.5 32-4-2.5 MEQ/L IV SOLN
INTRAVENOUS | Status: DC
  Administered 2013-04-05 – 2013-04-07 (×10): via INTRAVENOUS_CENTRAL
  Filled 2013-04-05 (×17): qty 5000

## 2013-04-05 MED ORDER — MIDAZOLAM HCL 2 MG/2ML IJ SOLN
INTRAMUSCULAR | Status: AC
  Administered 2013-04-05: 2 mg
  Filled 2013-04-05: qty 4

## 2013-04-05 MED ORDER — HEPARIN (PORCINE) IN NACL 100-0.45 UNIT/ML-% IJ SOLN
750.0000 [IU]/h | INTRAMUSCULAR | Status: DC
  Filled 2013-04-05: qty 250

## 2013-04-05 MED ORDER — NITROGLYCERIN 0.2 MG/ML ON CALL CATH LAB
INTRAVENOUS | Status: AC
  Filled 2013-04-05: qty 1

## 2013-04-05 MED ORDER — DEXTROSE 50 % IV SOLN
INTRAVENOUS | Status: AC
Start: 2013-04-05 — End: 2013-04-05
  Filled 2013-04-05: qty 50

## 2013-04-05 MED ORDER — ONDANSETRON HCL 4 MG/2ML IJ SOLN
4.0000 mg | Freq: Four times a day (QID) | INTRAMUSCULAR | Status: DC | PRN
  Administered 2013-04-05: 4 mg via INTRAVENOUS
  Filled 2013-04-05: qty 2

## 2013-04-05 MED ORDER — HEPARIN SODIUM (PORCINE) 1000 UNIT/ML IJ SOLN
7000.0000 [IU] | Freq: Once | INTRAMUSCULAR | Status: AC
  Administered 2013-04-05: 7000 [IU] via INTRAVENOUS
  Filled 2013-04-05: qty 7

## 2013-04-05 MED ORDER — LIDOCAINE HCL (PF) 1 % IJ SOLN
INTRAMUSCULAR | Status: AC
  Filled 2013-04-05: qty 30

## 2013-04-05 MED ORDER — SODIUM CHLORIDE 0.9 % FOR CRRT
INTRAVENOUS_CENTRAL | Status: DC | PRN
  Filled 2013-04-05: qty 1000

## 2013-04-05 MED ORDER — VANCOMYCIN HCL 1000 MG IV SOLR
750.0000 mg | INTRAVENOUS | Status: DC | PRN
  Filled 2013-04-05 (×2): qty 750

## 2013-04-05 MED ORDER — DOPAMINE-DEXTROSE 3.2-5 MG/ML-% IV SOLN
INTRAVENOUS | Status: AC
  Administered 2013-04-05: 5 ug/kg/min via INTRAVENOUS
  Filled 2013-04-05: qty 250

## 2013-04-05 MED ORDER — SODIUM CHLORIDE 0.9 % IV SOLN
25.0000 ug/h | INTRAVENOUS | Status: DC
  Administered 2013-04-05: 50 ug/h via INTRAVENOUS
  Administered 2013-04-06: 100 ug/h via INTRAVENOUS
  Administered 2013-04-07 – 2013-04-08 (×2): 150 ug/h via INTRAVENOUS
  Filled 2013-04-05 (×5): qty 50

## 2013-04-05 MED ORDER — NOREPINEPHRINE BITARTRATE 1 MG/ML IJ SOLN: 2.0000 ug/min | INTRAMUSCULAR | Status: DC

## 2013-04-05 MED ORDER — ETOMIDATE 2 MG/ML IV SOLN
INTRAVENOUS | Status: DC | PRN
  Administered 2013-04-05: 20 mg via INTRAVENOUS

## 2013-04-05 NOTE — Progress Notes (Signed)
Utilization Review Completed.  

## 2013-04-05 NOTE — Progress Notes (Signed)
Pharmacy Note-Anticoagulation  Pharmacy Consult :  58 y.o. female is currently on Heparin for NSTEMI.   Latest Labs : Hematology :  Recent Labs  September 21, 2013 1222 September 21, 2013 1413 September 21, 2013 1447 September 21, 2013 1816 September 21, 2013 2300 03/26/2013 0439 04/10/2013 0825 03/27/2013 0930  HGB 11.4*  --   --  12.6  --  10.5*  --   --   HCT 35.4*  --   --  37.0  --  32.5*  --   --   PLT 151  --   --   --   --  135*  --   --   APTT  --  175*  --   --   --   --   --   --   LABPROT  --  24.5*  --   --   --  52.8* 20.7*  --   INR  --  2.29*  --   --   --  6.27* 1.84*  --   HEPARINUNFRC  --   --   --   --  0.36 >2.20*  --  0.15*  CREATININE  --   --  7.81* 8.50*  --  4.06*  3.98*  --   --     Lab Results  Component Value Date   INR 1.84* 04/06/2013   INR 6.27* 03/30/2013   INR 2.29* 05/09/13        HEPARINUNFRC 0.15* 04/03/2013   HEPARINUNFRC >2.20* 04/17/2013   HEPARINUNFRC 0.36 05/09/13        HGB 10.5* 04/10/2013   HGB 12.6 05/09/13   HGB 11.4* 05/09/13    Current Medication[s] Include: Prior to Admission:  Prescriptions prior to admission  Medication Sig Dispense Refill  . aspirin 81 MG tablet Take 81 mg by mouth daily.      . cinacalcet (SENSIPAR) 30 MG tablet Take 30-90 mg by mouth daily. Pt takes 30 mg in the morning and 90 mg in the morning      . doxycycline (ADOXA) 100 MG tablet Take 100 mg by mouth daily. For 30 days. Stop date 04/07/12      . fluticasone (FLONASE) 50 MCG/ACT nasal spray Place 2 sprays into the nose 2 (two) times daily.      Marland Kitchen. gabapentin (NEURONTIN) 400 MG capsule Take 400 mg by mouth 2 (two) times daily. Pt takes one in the am and one at night      . insulin glargine (LANTUS) 100 UNIT/ML injection Inject 50 Units into the skin at bedtime.      . lidocaine-prilocaine (EMLA) cream Apply 1 application topically as needed (on dialysis port).      . multivitamin (RENA-VIT) TABS tablet Take 1 tablet by mouth daily.       . nitroGLYCERIN (NITROSTAT) 0.4 MG SL tablet Place 0.4 mg  under the tongue every 5 (five) minutes as needed. For chest pain      . pantoprazole (PROTONIX) 40 MG tablet Take 40 mg by mouth daily. Take 30 min before breakfast      . polyvinyl alcohol (LIQUIFILM TEARS) 1.4 % ophthalmic solution Place 1 drop into both eyes daily as needed. For dryness      . sevelamer (RENAGEL) 800 MG tablet Take 800 mg by mouth daily. Take 3 tabs with meals and 2 tabs with snacks      . sodium chloride (OCEAN) 0.65 % nasal spray Place 2 sprays into the nose 4 (four) times daily as needed. For nasal congestion      .  tetrahydrozoline 0.05 % ophthalmic solution Place 1 drop into both eyes daily.       Scheduled:  Scheduled:  . antiseptic oral rinse  15 mL Mouth Rinse BID  . aspirin EC  81 mg Oral Daily  . calcitRIOL  1 mcg Oral Once per day on Mon Wed Fri  . cinacalcet  30 mg Oral Q breakfast  . cinacalcet  90 mg Oral QPC supper  . diazepam  2 mg Oral On Call  . diphenhydrAMINE  25 mg Intravenous Pre-Cath  . famotidine (PEPCID) IV  20 mg Intravenous Pre-Cath  . gabapentin  400 mg Oral BID  . insulin aspart  0-9 Units Subcutaneous 6 times per day  . insulin glargine  20 Units Subcutaneous QHS  . methylPREDNISolone (SOLU-MEDROL) injection  125 mg Intravenous Pre-Cath  . metoprolol tartrate  12.5 mg Oral BID  . multivitamin  1 tablet Oral QHS  . pantoprazole  40 mg Oral Daily  . sevelamer carbonate  800 mg Oral TID WC  . sodium chloride  3 mL Intravenous Q12H   Infusion[s]: Infusions:  . sodium chloride 1 mL (04/10/2013 0512)  . sodium chloride 1 mL (04/15/2013 0116)  . heparin 900 Units/hr (Apr 10, 2013 1431)  . heparin     Assessment :  Today's Repeat INR is 1.8 with sample drawn appropriately.   Repeat Heparin level, appropriately drawn and infusion off ~ 2 hours, 0.15.  Some of the measured Heparin may be from Dialysis-related Heparin given.  Hgb 10.5, Platelets 135.  Patient with ESRD who is now hypotensive, and has had a recent Access procedure.  Vancomycin  to be started after blood cultures are drawn.   Goal :  Heparin goal is Heparin level 0.3-0.7 units/ml.  Pre-HD Vancomycin levels 15 - 25 mcg/ml.  Plan : 1. Heparin will be restarted at lower rate, no bolus, at 750 units/hr.   The next Heparin Level will be due in 6 hours @ 1800 pm if Cardiac Cath is delayed or postponed. If not postponed, will follow up after Cardiac Cath 2. Daily Heparin level, CBC while on Heparin.  Monitor for bleeding complications.  3. Vancomycin 1500 mg IV x 1, then 750 mg IV q HD, on outpatient MWF schedule.  Raylin Winer, Elisha Headland, Pharm.D. 04/01/2013  11:15 AM

## 2013-04-05 NOTE — Progress Notes (Signed)
INITIAL NUTRITION ASSESSMENT  DOCUMENTATION CODES Per approved criteria  -Obesity Unspecified   INTERVENTION: Advance diet as medically appropriate, add supplements when/as able RD to follow for nutrition care plan  NUTRITION DIAGNOSIS: Increased nutrient needs related to ESRD, on HD as evidenced by estimated nutrition needs  Goal: Pt to meet >/= 90% of their estimated nutrition needs   Monitor:  PO & supplemental intake, weight, labs, I/O's  Reason for Assessment: Malnutrition Screening Tool Report  58 y.o. female  Admitting Dx: NSTEMI (non-ST elevated myocardial infarction)  ASSESSMENT: Patient with PMH of CAD with CABG, ESRD, DM2, CVA and AKA; presented to ER with weakness after placement of new AV graft and removal of old graft, some bleeding post procedure.    Patient lethargic upon RD visit; per H&P patient has not felt well after procedure in ER with decreased appetite; patient currently NPO; patient with hx of poor appetite; drinks Nepro supplements; patient at nutrition risk given ESRD and predicted suboptimal intake -- RD to monitor diet advancement, order supplements when/ as able.  Height: Ht Readings from Last 1 Encounters:  04/06/2013 5\' 4"  (1.626 m)    Weight: Wt Readings from Last 1 Encounters:  07-09-2013 173 lb 1 oz (78.5 kg)    Ideal Body Weight: 108 lb -- adjusted for AKA  % Ideal Body Weight: 160%  Wt Readings from Last 10 Encounters:  07-09-2013 173 lb 1 oz (78.5 kg)  07-09-2013 173 lb 1 oz (78.5 kg)  07-09-2013 173 lb 1 oz (78.5 kg)  02/17/12 149 lb 0.5 oz (67.6 kg)  02/17/12 149 lb 0.5 oz (67.6 kg)  01/23/12 149 lb 14.6 oz (68 kg)  01/23/12 149 lb 14.6 oz (68 kg)  01/04/12 157 lb 13.6 oz (71.6 kg)  12/14/11 159 lb 6.3 oz (72.3 kg)  12/14/11 159 lb 6.3 oz (72.3 kg)    Usual Body Weight: 149 lb  % Usual Body Weight: 116%  BMI:  32.7 kg/m2 -- adjusted for AKA  Estimated Nutritional Needs: Kcal: 1900-2100 Protein: 90-100 gm Fluid: 1.9-2.1  L  Skin: Intact  Diet Order: NPO  EDUCATION NEEDS: -No education needs identified at this time   Intake/Output Summary (Last 24 hours) at 07-09-2013 1203 Last data filed at 07-09-2013 1146  Gross per 24 hour  Intake    231 ml  Output      0 ml  Net    231 ml    Labs:   Recent Labs Lab 04/14/2013 1447 04/19/2013 1816 07-09-2013 0439  NA 134* 135* 138  140  K 6.3* 5.1 3.2*  3.2*  CL 90* 98 95*  96  CO2 17*  --  26  24  BUN 49* 50* 18  18  CREATININE 7.81* 8.50* 4.06*  3.98*  CALCIUM 8.2*  --  8.2*  8.0*  PHOS  --   --  3.0  GLUCOSE 364* 290* 59*  60*    CBG (last 3)   Recent Labs  07-09-2013 0822 07-09-2013 0903 07-09-2013 1101  GLUCAP 69* 72 76    Scheduled Meds: . antiseptic oral rinse  15 mL Mouth Rinse BID  . aspirin EC  81 mg Oral Daily  . calcitRIOL  1 mcg Oral Once per day on Mon Wed Fri  . cinacalcet  30 mg Oral Q breakfast  . cinacalcet  90 mg Oral QPC supper  . dextrose      . diazepam  2 mg Oral On Call  . diphenhydrAMINE  25 mg Intravenous Pre-Cath  .  famotidine (PEPCID) IV  20 mg Intravenous Pre-Cath  . insulin aspart  0-9 Units Subcutaneous 6 times per day  . insulin glargine  20 Units Subcutaneous QHS  . methylPREDNISolone (SOLU-MEDROL) injection  125 mg Intravenous Pre-Cath  . multivitamin  1 tablet Oral QHS  . pantoprazole  40 mg Oral Daily  . sevelamer carbonate  800 mg Oral TID WC  . sodium chloride  3 mL Intravenous Q12H    Continuous Infusions: . sodium chloride 1 mL (04/16/2013 0512)  . sodium chloride 1 mL (04/08/2013 0116)  . heparin 900 Units/hr (03/27/2013 1431)  . heparin 750 Units/hr (04/14/2013 1115)    Past Medical History  Diagnosis Date  . Hyperparathyroidism, secondary renal   . CAD (coronary artery disease) of artery bypass graft     CABG in 2002  . PAD (peripheral artery disease)     Not a candidate for revascularization  . Chronic constipation   . GERD (gastroesophageal reflux disease)   . Anemia of chronic renal  failure   . Tardive dyskinesia     Secondary to Reglan  . Depression   . Neuropathy   . Diabetic retinopathy   . Diastolic CHF, chronic     Grade 2  . Pulmonary embolism 06/2003  . Hyperlipidemia   . Stroke 2002    "w/left side" residual weakness  . Diabetes mellitus     type II  . CHF (congestive heart failure)   . CVA (cerebral infarction)     right brain with minimal left hemiparesis  . Pneumonia 10/2009; 11/2009    ?pneumonia 01/17/11  . Hypertension   . Arthritis   . Complication of anesthesia     "sleepy afterwards; due to her being dialysis pt"  . Myocardial infarction 2002  . ESRD on dialysis     [name]; M, W, F; via right upper arm AVF (01/18/2012)    Past Surgical History  Procedure Laterality Date  . Coronary artery bypass graft  2002  . Laparoscopic cholecystectomy  2002  . Av fistula placement      times 4-using rt upper arm graft-old rt lower arm,lt upper and lower old grafts  . Trigger finger release    . Tubal ligation    . Eye examination under anesthesia w/ retinal cryotherapy and retinal laser    . Cataract extraction  07/2010    right sided   . Toe amputation  5/12    left 2nd toe secondary to osteomyelitis  . Breast biopsy  06/2010  . Carpal tunnel release  05/05/2011    Procedure: CARPAL TUNNEL RELEASE;  Surgeon: Sharma Covert, MD;  Location: [facility];  Service: Orthopedics;  Laterality: Right;  . Coronary angioplasty with stent placement    . Amputation  11/24/2011    Procedure: AMPUTATION RAY;  Surgeon: Nadara Mustard, MD;  Location[facility] OR;  Service: Orthopedics;  Laterality: Right;  Right Foot 2nd Ray Amputation  . Amputation  12/12/2011    Procedure: AMPUTATION BELOW KNEE;  Surgeon: Nadara Mustard, MD;  Location: [facility] OR;  Service: Orthopedics;  Laterality: Right;  . 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 Amputation  01/17/2012    Procedure: AMPUTATION ABOVE KNEE;  Surgeon: Nadara Mustard, MD;  Location: [facility] OR;   Service: Orthopedics;  Laterality: Right;  Right Above Knee Amputation  . I&d extremity  02/15/2012    Procedure: IRRIGATION AND DEBRIDEMENT EXTREMITY;  Surgeon: Kathryne Hitch, MD;  Location: [facility] OR;  Service: Orthopedics;  Laterality: Right;  . Thrombectomy and revision of arterioventous (av) goretex  graft Right 04/02/2013    Procedure: THROMBECTOMY AND REVISION OF ARTERIOVENTOUS (AV) GORETEX  GRAFT/RIGHT UPPER ARM;  Surgeon: Sherren Kerns, MD;  Location: [facility] OR;  Service: Vascular;  Laterality: Right;  . Insertion of dialysis catheter Right 04/02/2013    Procedure: INSERTION OF DIALYSIS CATHETER FEMORAL;  Surgeon: Sherren Kerns, MD;  Location: [facility] OR;  Service: Vascular;  Laterality: Right;    Maureen Chatters, RD, LDN Pager #: [phone] After-Hours Pager #: [phone]

## 2013-04-05 NOTE — ED Notes (Signed)
Iv bolus of 2 mg versed and 50 mcgs of fentanyl

## 2013-04-05 NOTE — Transfer of Care (Signed)
Immediate Anesthesia Transfer of Care Note Pt in cath lab PACU.

## 2013-04-05 NOTE — Progress Notes (Signed)
ANTICOAGULATION CONSULT NOTE - Follow Up Consult  Pharmacy Consult for heparin Indication: NSTEMI  Labs:  Recent Labs  04/02/13 1100 12-31-13 1222 12-31-13 1413 12-31-13 1446 12-31-13 1447 12-31-13 1816 12-31-13 2245 12-31-13 2300  HGB 13.3 11.4*  --   --   --  12.6  --   --   HCT 39.0 35.4*  --   --   --  37.0  --   --   PLT  --  151  --   --   --   --   --   --   APTT  --   --  175*  --   --   --   --   --   LABPROT  --   --  24.5*  --   --   --   --   --   INR  --   --  2.29*  --   --   --   --   --   HEPARINUNFRC  --   --   --   --   --   --   --  0.36  CREATININE  --   --   --   --  7.81* 8.50*  --   --   CKTOTAL  --   --   --  350*  --   --   --   --   CKMB  --   --   --  20.3*  --   --   --   --   TROPONINI  --   --   --  7.91*  --   --  14.40*  --     Assessment/Plan: 58yo female therapeutic on heparin with initial dosing for NSTEMI.  Will continue gtt at current rate and confirm stable with am labs.   Vernard GamblesVeronda Abbygale Lapid, PharmD, BCPS  04/14/2013,12:41 AM

## 2013-04-05 NOTE — Progress Notes (Signed)
CRITICAL VALUE ALERT  Critical value received:  INR 6.27  Date of notification:  04/20/2013  Time of notification:  0645  Critical value read back:yes  Nurse who received alert:  Lesli AlbeeErin Herve Haug  MD notified (1st page):  Cardiology  Time of first page:  0700  MD notified (2nd page):  Time of second page:  Responding MD:  Cardiology   Time MD responded:

## 2013-04-05 NOTE — Progress Notes (Signed)
Pt to 2H05 from CCL at 1620. On arrival, pt is responsive to voice, turns head and opens eyes when asked. BPs as noted in CHL. Dr Molli KnockYacoub at bedside. At 1648 2mg  Versed, 100mcg Fentanyl given IV for sedation for CL insertion attempt. 1656- BP dropping rapidly despite Levophed titration to 535mcg/min, Dobutamine started at 5, 1 amp NaHCO3 given. Pt then exhibited rebound HTN, and pressors titrated accordingly. 1712 second CL insertion attempt unsuccessful, pt pressures remain extremely labile, currently in 50's, second amp NaHCO3 given.   At this time, pt receiving D5W with NaHCO3 @100 /hr, Levophed @ 10, Dobutamine @ 5, Versed at 2mg /hr, and Fent at 7450mcg/hr.

## 2013-04-05 NOTE — ED Notes (Signed)
Pt arrived on seddation- has IV Fentanyl and IV Versed infusing

## 2013-04-05 NOTE — Progress Notes (Signed)
[name] Progress Note   Subjective: No new complaints, not feeling great, no CP or sob, BP's low in 80's-90's  Exam  Blood pressure 80/48, pulse 68, temperature 97.6 F (36.4 C), temperature source Oral, resp. rate 16, height 5\' 4"  (1.626 m), weight 78.5 kg (173 lb 1 oz), SpO2 94.00%. Alert, looks a little better but still restless, weak +JVD L chest clear, faint rales R base RRR no rub or gallop Abd soft, nt, nd, no ascites R arm w ace wrap, no UE or LE edema R thigh TDC in place Neuro is nf, ox3, gen weakness  Dialysis: MWF [facility] 3.5h   76kg   2K/2.0[place]ath  Heparin 5000   R fem TDC (R arm AVG revised 2/10)  Calcitriol po 1ug tiw    Epo none     Venofer none Last labs: Hb 11.6 tsat 21% ferritin 1621 phos 2.8  pth 231   Assessment: 1 NSTEMI- per cardiology 2 Hyperkalemia- resolved s/p HD last night 3 Hypotension- not sure etiology, her normal syst BP's are 120-150 pre dialysis; r/o sepsis, cardiac cause, have d/w primary 4 HTN/volume- no BP meds at home, 1-2kg above dry wt, no vol excess or depletion, cxr mostly clear 5 Anemia Hb 10.5, no esa for now 6 2HPTH- hold binders, cont sensipar + po vit D 7 S/P removal degenerative RUA AVG and replacement new RUA AVG 2/10 (Dr Darrick PennaFields)    Plan- Blood cx's, Vanc IV; last echo 2011, ordered echo for today, cancelled HD today due to low BP's and she had HD yest and the day before    Vinson Moselleob Devion Chriscoe MD  pager [phone]    cell [phone]  03/31/2013, 11:15 AM     Recent Labs Lab 03/28/2013 1447 04/08/2013 1816 04/18/2013 0439  NA 134* 135* 138  140  K 6.3* 5.1 3.2*  3.2*  CL 90* 98 95*  96  CO2 17*  --  26  24  GLUCOSE 364* 290* 59*  60*  BUN 49* 50* 18  18  CREATININE 7.81* 8.50* 4.06*  3.98*  CALCIUM 8.2*  --  8.2*  8.0*  PHOS  --   --  3.0    Recent Labs Lab 04/19/2013 0439  ALBUMIN 3.6    Recent Labs Lab 03/26/2013 1222 03/26/2013 1816 04/18/2013 0439  WBC 10.2  --  10.0  NEUTROABS 7.4  --   --   HGB  11.4* 12.6 10.5*  HCT 35.4* 37.0 32.5*  MCV 85.3  --  84.6  PLT 151  --  135*   . antiseptic oral rinse  15 mL Mouth Rinse BID  . aspirin EC  81 mg Oral Daily  . calcitRIOL  1 mcg Oral Once per day on Mon Wed Fri  . cinacalcet  30 mg Oral Q breakfast  . cinacalcet  90 mg Oral QPC supper  . diazepam  2 mg Oral On Call  . diphenhydrAMINE  25 mg Intravenous Pre-Cath  . famotidine (PEPCID) IV  20 mg Intravenous Pre-Cath  . gabapentin  400 mg Oral BID  . insulin aspart  0-9 Units Subcutaneous 6 times per day  . insulin glargine  20 Units Subcutaneous QHS  . methylPREDNISolone (SOLU-MEDROL) injection  125 mg Intravenous Pre-Cath  . metoprolol tartrate  12.5 mg Oral BID  . multivitamin  1 tablet Oral QHS  . pantoprazole  40 mg Oral Daily  . sevelamer carbonate  800 mg Oral TID WC  . sodium chloride  3 mL Intravenous Q12H   .  sodium chloride 1 mL (04/07/2013 0512)  . sodium chloride 1 mL (03/31/2013 0116)  . heparin 900 Units/hr (04/23/2013 1431)  . heparin     sodium chloride, lidocaine-prilocaine, nitroGLYCERIN, ondansetron (ZOFRAN) IV, polyvinyl alcohol, sodium chloride

## 2013-04-05 NOTE — Progress Notes (Signed)
Subjective:  No CP/SOB  Objective:  Temp:  [97 F (36.1 C)-99.2 F (37.3 C)] 97.6 F (36.4 C) (02/13 0800) Pulse Rate:  [65-82] 71 (02/13 0800) Resp:  [13-24] 13 (02/13 0800) BP: (78-125)/(45-74) 96/53 mmHg (02/13 0800) SpO2:  [92 %-100 %] 100 % (02/13 0800) Weight:  [169 lb 12.1 oz (77 kg)-173 lb 1 oz (78.5 kg)] 173 lb 1 oz (78.5 kg) (02/13 0500) Weight change:   Intake/Output from previous day: 02/12 0701 - 02/13 0700 In: 228 [I.V.:228] Out: 0   Intake/Output from this shift:    Physical Exam: General appearance: alert and no distress Neck: no adenopathy, no carotid bruit, no JVD, supple, symmetrical, trachea midline and thyroid not enlarged, symmetric, no tenderness/mass/nodules Lungs: clear to auscultation bilaterally Heart: regular rate and rhythm, S1, S2 normal, no murmur, click, rub or gallop Extremities: extremities normal, atraumatic, no cyanosis or edema  Lab Results: Results for orders placed during the hospital encounter of 04/19/2013 (from the past 48 hour(s))  CBC WITH DIFFERENTIAL     Status: Abnormal   Collection Time    03/25/2013 12:22 PM      Result Value Ref Range   WBC 10.2  4.0 - 10.5 K/uL   RBC 4.15  3.87 - 5.11 MIL/uL   Hemoglobin 11.4 (*) 12.0 - 15.0 g/dL   HCT 35.4 (*) 36.0 - 46.0 %   MCV 85.3  78.0 - 100.0 fL   MCH 27.5  26.0 - 34.0 pg   MCHC 32.2  30.0 - 36.0 g/dL   RDW 16.3 (*) 11.5 - 15.5 %   Platelets 151  150 - 400 K/uL   Neutrophils Relative % 73  43 - 77 %   Neutro Abs 7.4  1.7 - 7.7 K/uL   Lymphocytes Relative 17  12 - 46 %   Lymphs Abs 1.7  0.7 - 4.0 K/uL   Monocytes Relative 11  3 - 12 %   Monocytes Absolute 1.1 (*) 0.1 - 1.0 K/uL   Eosinophils Relative 0  0 - 5 %   Eosinophils Absolute 0.0  0.0 - 0.7 K/uL   Basophils Relative 0  0 - 1 %   Basophils Absolute 0.0  0.0 - 0.1 K/uL  POCT I-STAT TROPONIN I     Status: Abnormal   Collection Time    03/28/2013 12:37 PM      Result Value Ref Range   Troponin i, poc 9.71 (*)  0.00 - 0.08 ng/mL   Comment NOTIFIED PHYSICIAN     Comment 3       Comment: Due to the release kinetics of cTnI, a negative result within the first hours of the onset of symptoms does not rule out myocardial infarction with certainty. If myocardial infarction is still suspected, repeat the test at appropriate intervals.   POC Troponin I       Comment: Results called to HD and shown to dr Curly Rim  APTT     Status: Abnormal   Collection Time    04/03/2013  2:13 PM      Result Value Ref Range   aPTT 175 (*) 24 - 37 seconds   Comment:            IF BASELINE aPTT IS ELEVATED,     SUGGEST PATIENT RISK ASSESSMENT     BE USED TO DETERMINE APPROPRIATE     ANTICOAGULANT THERAPY.  PROTIME-INR     Status: Abnormal   Collection Time    04/12/2013  2:13 PM      Result Value Ref Range   Prothrombin Time 24.5 (*) 11.6 - 15.2 seconds   INR 2.29 (*) 0.00 - 1.49  TROPONIN I     Status: Abnormal   Collection Time    04/16/2013  2:46 PM      Result Value Ref Range   Troponin I 7.91 (*) <0.30 ng/mL   Comment:            Due to the release kinetics of cTnI,     a negative result within the first hours     of the onset of symptoms does not rule out     myocardial infarction with certainty.     If myocardial infarction is still suspected,     repeat the test at appropriate intervals.     CRITICAL RESULT CALLED TO, READ BACK BY AND VERIFIED WITH:     DOOLEY H,RN 04/17/2013 1614 WAYK  CK TOTAL AND CKMB     Status: Abnormal   Collection Time    03/24/2013  2:46 PM      Result Value Ref Range   Total CK 350 (*) 7 - 177 U/L   CK, MB 20.3 (*) 0.3 - 4.0 ng/mL   Comment: CRITICAL RESULT CALLED TO, READ BACK BY AND VERIFIED WITH:     DOOLEY H,RN 04/03/2013 1614 WAYK   Relative Index 5.8 (*) 0.0 - 2.5  BASIC METABOLIC PANEL     Status: Abnormal   Collection Time    04/03/2013  2:47 PM      Result Value Ref Range   Sodium 134 (*) 137 - 147 mEq/L   Potassium 6.3 (*) 3.7 - 5.3 mEq/L   Comment: NO VISIBLE HEMOLYSIS    Chloride 90 (*) 96 - 112 mEq/L   CO2 17 (*) 19 - 32 mEq/L   Glucose, Bld 364 (*) 70 - 99 mg/dL   BUN 49 (*) 6 - 23 mg/dL   Creatinine, Ser 7.81 (*) 0.50 - 1.10 mg/dL   Calcium 8.2 (*) 8.4 - 10.5 mg/dL   GFR calc non Af Amer 5 (*) >90 mL/min   GFR calc Af Amer 6 (*) >90 mL/min   Comment: (NOTE)     The eGFR has been calculated using the CKD EPI equation.     This calculation has not been validated in all clinical situations.     eGFR's persistently <90 mL/min signify possible Chronic Kidney     Disease.  GLUCOSE, CAPILLARY     Status: Abnormal   Collection Time    04/03/2013  4:08 PM      Result Value Ref Range   Glucose-Capillary 358 (*) 70 - 99 mg/dL  POCT I-STAT, CHEM 8     Status: Abnormal   Collection Time    03/28/2013  6:16 PM      Result Value Ref Range   Sodium 135 (*) 137 - 147 mEq/L   Potassium 5.1  3.7 - 5.3 mEq/L   Chloride 98  96 - 112 mEq/L   BUN 50 (*) 6 - 23 mg/dL   Creatinine, Ser 8.50 (*) 0.50 - 1.10 mg/dL   Glucose, Bld 290 (*) 70 - 99 mg/dL   Calcium, Ion 1.00 (*) 1.12 - 1.23 mmol/L   TCO2 22  0 - 100 mmol/L   Hemoglobin 12.6  12.0 - 15.0 g/dL   HCT 37.0  36.0 - 46.0 %  MRSA PCR SCREENING     Status: None   Collection  Time    04/06/2013  7:00 PM      Result Value Ref Range   MRSA by PCR NEGATIVE  NEGATIVE   Comment:            The GeneXpert MRSA Assay (FDA     approved for NASAL specimens     only), is one component of a     comprehensive MRSA colonization     surveillance program. It is not     intended to diagnose MRSA     infection nor to guide or     monitor treatment for     MRSA infections.  GLUCOSE, CAPILLARY     Status: Abnormal   Collection Time    04/14/2013  8:36 PM      Result Value Ref Range   Glucose-Capillary 174 (*) 70 - 99 mg/dL  TROPONIN I     Status: Abnormal   Collection Time    04/09/2013 10:45 PM      Result Value Ref Range   Troponin I 14.40 (*) <0.30 ng/mL   Comment:            Due to the release kinetics of cTnI,     a  negative result within the first hours     of the onset of symptoms does not rule out     myocardial infarction with certainty.     If myocardial infarction is still suspected,     repeat the test at appropriate intervals.     CRITICAL VALUE NOTED.  VALUE IS CONSISTENT WITH PREVIOUSLY REPORTED AND CALLED VALUE.  PRO B NATRIURETIC PEPTIDE     Status: Abnormal   Collection Time    03/30/2013 10:45 PM      Result Value Ref Range   Pro B Natriuretic peptide (BNP) 44743.0 (*) 0 - 125 pg/mL  HEPARIN LEVEL (UNFRACTIONATED)     Status: None   Collection Time    04/17/2013 11:00 PM      Result Value Ref Range   Heparin Unfractionated 0.36  0.30 - 0.70 IU/mL   Comment:            IF HEPARIN RESULTS ARE BELOW     EXPECTED VALUES, AND PATIENT     DOSAGE HAS BEEN CONFIRMED,     SUGGEST FOLLOW UP TESTING     OF ANTITHROMBIN III LEVELS.  GLUCOSE, CAPILLARY     Status: Abnormal   Collection Time    04/17/2013 12:11 AM      Result Value Ref Range   Glucose-Capillary 125 (*) 70 - 99 mg/dL  GLUCOSE, CAPILLARY     Status: None   Collection Time    04/12/2013  4:01 AM      Result Value Ref Range   Glucose-Capillary 76  70 - 99 mg/dL  HEPARIN LEVEL (UNFRACTIONATED)     Status: Abnormal   Collection Time    03/26/2013  4:39 AM      Result Value Ref Range   Heparin Unfractionated >2.20 (*) 0.30 - 0.70 IU/mL   Comment: RESULTS CONFIRMED BY MANUAL DILUTION                IF HEPARIN RESULTS ARE BELOW     EXPECTED VALUES, AND PATIENT     DOSAGE HAS BEEN CONFIRMED,     SUGGEST FOLLOW UP TESTING     OF ANTITHROMBIN III LEVELS.  CBC     Status: Abnormal   Collection Time    03/27/2013  4:39 AM  Result Value Ref Range   WBC 10.0  4.0 - 10.5 K/uL   RBC 3.84 (*) 3.87 - 5.11 MIL/uL   Hemoglobin 10.5 (*) 12.0 - 15.0 g/dL   HCT 32.5 (*) 36.0 - 46.0 %   MCV 84.6  78.0 - 100.0 fL   MCH 27.3  26.0 - 34.0 pg   MCHC 32.3  30.0 - 36.0 g/dL   RDW 16.3 (*) 11.5 - 15.5 %   Platelets 135 (*) 150 - 400 K/uL  RENAL  FUNCTION PANEL     Status: Abnormal   Collection Time    04/08/2013  4:39 AM      Result Value Ref Range   Sodium 138  137 - 147 mEq/L   Potassium 3.2 (*) 3.7 - 5.3 mEq/L   Comment: DELTA CHECK NOTED   Chloride 95 (*) 96 - 112 mEq/L   CO2 26  19 - 32 mEq/L   Glucose, Bld 59 (*) 70 - 99 mg/dL   BUN 18  6 - 23 mg/dL   Comment: DELTA CHECK NOTED   Creatinine, Ser 4.06 (*) 0.50 - 1.10 mg/dL   Comment: DELTA CHECK NOTED   Calcium 8.2 (*) 8.4 - 10.5 mg/dL   Phosphorus 3.0  2.3 - 4.6 mg/dL   Albumin 3.6  3.5 - 5.2 g/dL   GFR calc non Af Amer 11 (*) >90 mL/min   GFR calc Af Amer 13 (*) >90 mL/min   Comment: (NOTE)     The eGFR has been calculated using the CKD EPI equation.     This calculation has not been validated in all clinical situations.     eGFR's persistently <90 mL/min signify possible Chronic Kidney     Disease.  BASIC METABOLIC PANEL     Status: Abnormal   Collection Time    03/27/2013  4:39 AM      Result Value Ref Range   Sodium 140  137 - 147 mEq/L   Potassium 3.2 (*) 3.7 - 5.3 mEq/L   Chloride 96  96 - 112 mEq/L   CO2 24  19 - 32 mEq/L   Glucose, Bld 60 (*) 70 - 99 mg/dL   BUN 18  6 - 23 mg/dL   Creatinine, Ser 3.98 (*) 0.50 - 1.10 mg/dL   Calcium 8.0 (*) 8.4 - 10.5 mg/dL   GFR calc non Af Amer 11 (*) >90 mL/min   GFR calc Af Amer 13 (*) >90 mL/min   Comment: (NOTE)     The eGFR has been calculated using the CKD EPI equation.     This calculation has not been validated in all clinical situations.     eGFR's persistently <90 mL/min signify possible Chronic Kidney     Disease.  PROTIME-INR     Status: Abnormal   Collection Time    04/15/2013  4:39 AM      Result Value Ref Range   Prothrombin Time 52.8 (*) 11.6 - 15.2 seconds   INR 6.27 (*) 0.00 - 1.49   Comment: REPEATED TO VERIFY     CRITICAL RESULT CALLED TO, READ BACK BY AND VERIFIED WITH:     Dulcy Fanny (RN) [phone] 04/06/2013 L. [facility]  LIPID PANEL     Status: None   Collection Time    03/24/2013  4:39 AM       Result Value Ref Range   Cholesterol 118  0 - 200 mg/dL   Triglycerides 108  <150 mg/dL   HDL 42  >39 mg/dL  Total CHOL/HDL Ratio 2.8     VLDL 22  0 - 40 mg/dL   LDL Cholesterol 54  0 - 99 mg/dL   Comment:            Total Cholesterol/HDL:CHD Risk     Coronary Heart Disease Risk Table                         Men   Women      1/2 Average Risk   3.4   3.3      Average Risk       5.0   4.4      2 X Average Risk   9.6   7.1      3 X Average Risk  23.4   11.0                Use the calculated Patient Ratio     above and the CHD Risk Table     to determine the patient's CHD Risk.                ATP III CLASSIFICATION (LDL):      <100     mg/dL   Optimal      100-129  mg/dL   Near or Above                        Optimal      130-159  mg/dL   Borderline      160-189  mg/dL   High      >190     mg/dL   Very High    Imaging: Imaging results have been reviewed  Assessment/Plan:   1. Principal Problem: 2.   NSTEMI (non-ST elevated myocardial infarction), Troponin 9.75 3. Active Problems: 4.   ESRD (end stage renal disease) on dialysis 5.   Diabetes mellitus, type 2 6.   CAD (coronary artery disease) of artery bypass graft 7.   Tardive dyskinesia 8.   Time Spent Directly with Patient:  20 minutes Trop peaked at 14. No CP/SOB. K+ decreased to 3.2 (needs careful repletion). Exam benign. For cath today. Will need to coordinate HD afterwards.  Length of Stay:  LOS: 1 day    Taneisha Fuson J 04/19/2013, 8:12 AM

## 2013-04-05 NOTE — Addendum Note (Signed)
Addendum created August 04, 2013 1642 by Corky Soxhris Amana Bouska, MD   Modules edited: Anesthesia Attestations

## 2013-04-05 NOTE — Progress Notes (Signed)
ANTIBIOTIC CONSULT NOTE - INITIAL  Pharmacy Consult for Zosyn Indication: Possible AV fistula infection  ANTICOAGULATION CONSULT NOTE - Follow Up Consult Pharmacy Consult for heparin  Indication: chest pain/ACS (NSTEMI)   Allergies  Allergen Reactions  . Lipitor [Atorvastatin Calcium] Other (See Comments)    Muscle weakness  . Codeine Nausea And Vomiting  . Metoclopramide Hcl Other (See Comments)    Tardive dyskinesia  . Iohexol Itching    Facility where pt lives does not have this listed as an allergy and she has not been pre-medicated in the past  . Oxycodone Hypertension    Patient Measurements: Height: 5\' 4"  (162.6 cm) Weight: 173 lb 1 oz (78.5 kg) IBW/kg (Calculated) : 54.7   Vital Signs: Temp: 98.5 F (36.9 C) (02/13 1126) Temp src: Oral (02/13 1126) BP: 106/52 mmHg (02/13 1850) Pulse Rate: 111 (02/13 1850) Intake/Output from previous day: 02/12 0701 - 02/13 0700 In: 228 [I.V.:228] Out: 0  Intake/Output from this shift:    Labs:  Recent Labs  04/23/13 1222  04/02/2013 0439 04/12/2013 1553 04/01/2013 1600 04/03/2013 1937  WBC 10.2  --  10.0  --  12.6*  --   HGB 11.4*  < > 10.5* 13.6 11.4* 12.2  PLT 151  --  135*  --  111*  --   CREATININE  --   < > 4.06*  3.98* 4.60* 4.53* 4.60*  < > = values in this interval not displayed. Estimated Creatinine Clearance: 13.3 ml/min (by C-G formula based on Cr of 4.6). No results found for this basename: Rolm Gala, [place], [place], [place], GENTRANDOM, TOBRATROUGH, TOBRAPEAK, TOBRARND, AMIKACINPEAK, AMIKACINTROU, AMIKACIN,  in the last 72 hours   Labs:  Recent Labs  23-Apr-2013 1222  04-23-2013 1413  04-23-2013 1446  Apr 23, 2013 2245  04/10/2013 0439 03/25/2013 0825 03/29/2013 0930 04/16/2013 1245 04/19/2013 1553 04/04/2013 1600 03/26/2013 1937  HGB 11.4*  --   --   --   --   < >  --   --  10.5*  --   --   --  13.6 11.4* 12.2  HCT 35.4*  --   --   --   --   < >  --   --  32.5*  --   --   --  40.0 36.7 36.0  PLT  151  --   --   --   --   --   --   --  135*  --   --   --   --  111*  --   APTT  --   --  175*  --   --   --   --   --   --   --   --   --   --   --   --   LABPROT  --   < > 24.5*  --   --   --   --   --  52.8* 20.7*  --   --   --  21.1*  --   INR  --   < > 2.29*  --   --   --   --   --  6.27* 1.84*  --   --   --  1.89*  --   HEPARINUNFRC  --   --   --   --   --   --   --   < > >2.20*  --  0.15*  --   --  <0.10*  --   CREATININE  --   --   --   --   --   < >  --   --  4.06*  3.98*  --   --   --  4.60* 4.53* 4.60*  CKTOTAL  --   --   --   --  350*  --   --   --   --   --   --   --   --   --   --   CKMB  --   --   --   --  20.3*  --   --   --   --   --   --   --   --   --   --   TROPONINI  --   --   --   < > 7.91*  --  14.40*  --   --  10.26*  --  8.78*  --   --   --   < > = values in this interval not displayed.    Microbiology: Recent Results (from the past 720 hour(s))  MRSA PCR SCREENING     Status: None   Collection Time    03/27/2013  7:00 PM      Result Value Ref Range Status   MRSA by PCR NEGATIVE  NEGATIVE Final   Comment:            The GeneXpert MRSA Assay (FDA     approved for NASAL specimens     only), is one component of a     comprehensive MRSA colonization     surveillance program. It is not     intended to diagnose MRSA     infection nor to guide or     monitor treatment for     MRSA infections.    Medical History: Past Medical History  Diagnosis Date  . Hyperparathyroidism, secondary renal   . CAD (coronary artery disease) of artery bypass graft     CABG in 2002  . PAD (peripheral artery disease)     Not a candidate for revascularization  . Chronic constipation   . GERD (gastroesophageal reflux disease)   . Anemia of chronic renal failure   . Tardive dyskinesia     Secondary to Reglan  . Depression   . Neuropathy   . Diabetic retinopathy   . Diastolic CHF, chronic     Grade 2  . Pulmonary embolism 06/2003  . Hyperlipidemia   . Stroke 2002    "w/left  side" residual weakness  . Diabetes mellitus     type II  . CHF (congestive heart failure)   . CVA (cerebral infarction)     right brain with minimal left hemiparesis  . Pneumonia 10/2009; 11/2009    ?pneumonia 01/17/11  . Hypertension   . Arthritis   . Complication of anesthesia     "sleepy afterwards; due to her being dialysis pt"  . Myocardial infarction 2002  . ESRD on dialysis     [address]; M, W, F; via right upper arm AVF (01/18/2012)    Medications:  Scheduled:  . antiseptic oral rinse  15 mL Mouth Rinse BID  . aspirin EC  81 mg Oral Daily  . calcitRIOL  1 mcg Oral Once per day on Mon Wed Fri  . cinacalcet  30 mg Oral Q breakfast  . cinacalcet  90 mg Oral QPC supper  . dextrose      . DOPamine      . hydrocortisone sod succinate (SOLU-CORTEF) inj  100 mg Intravenous Q8H  . insulin aspart  0-9 Units Subcutaneous 6 times per  day  . insulin glargine  20 Units Subcutaneous QHS  . multivitamin  1 tablet Oral QHS  . pantoprazole  40 mg Oral Daily  . piperacillin-tazobactam (ZOSYN)  IV  2.25 g Intravenous 4 times per day  . sevelamer carbonate  800 mg Oral TID WC  . sodium bicarbonate  50 mEq Intravenous Once  . sodium chloride  500 mL Intravenous Q30 min   Infusions:  . sodium chloride    . DOPamine    . fentaNYL infusion INTRAVENOUS 50 mcg/hr (04/06/2013 1735)  . midazolam (VERSED) infusion 2 mg/hr (04/14/2013 1736)  . norepinephrine (LEVOPHED) Adult infusion    . dialysis replacement fluid (prismasate)    . dialysate (PRISMASATE)    .  sodium bicarbonate  infusion 1000 mL 100 mL/hr at 04/12/2013 1723  . sodium bicarbonate (isotonic) 1000 mL infusion    . vasopressin (PITRESSIN) infusion - *FOR SHOCK*     Assessment: 58 y.o with ESRD on HD qMWF. Now on CRRT. S/p cardiac cath today, had cardiac arrest requiring CPR in cath lab, now intubated. Zosyn starting for possible AV fistula infection.   Heparin infusion to restart 8 hours post sheath. Sheath removed at 14:52.  No bleeding or hematoma reported. Of note, patient had graft bleeding a few days ago which then clotted off.    Goal of Therapy:  Eradication of infection Heparin goal is Heparin level 0.3-0.7 units/ml.   Plan:  Zosyn 2.25 gm IV q6hRestart IV heparin drip @22 :50 tonight at previous rate of 750 units/hr Check 6 hour heparin level.  Daily CBC and heparin level.   Noah Delaine, RPh Clinical Pharmacist Pager: [phone] 04/03/2013,7:49 PM

## 2013-04-05 NOTE — Interval H&P Note (Signed)
History and Physical Interval Note:  04/10/2013 11:29 AM The patient is seen in conjunction with Dr. Arlean HoppingSchertz at 11:15 AM today. She is lethargic. She does not complain of chest discomfort or dyspnea. Her blood pressure is 80/60 mmHg for unexplained reasons. Her radial pulses are not palpable. She has an access catheter in the right femoral. Her troponin has peaked at 15. EKGs reveal left lateral precordial ST segment depression. No evolutionary changes to Q waves and been noted overnight.  After speaking with Dr. Arlean HoppingSchertz, he feels and I agree that the delaying this procedure to treat her hypotension is appropriate.  I clinically suspect that she is occluded the saphenous vein graft to the circumflex.  We will postpone this procedure to another time once she is more stable.   Lesleigh NoeSMITH III,Nicollette Wilhelmi W

## 2013-04-05 NOTE — ED Notes (Signed)
This patient is not a true sedation case.

## 2013-04-05 NOTE — Anesthesia Preprocedure Evaluation (Signed)
Anesthesia Evaluation    Airway       Dental   Pulmonary pneumonia -,          Cardiovascular hypertension, + CAD, + Past MI, + Peripheral Vascular Disease and +CHF     Neuro/Psych PSYCHIATRIC DISORDERS CVA    GI/Hepatic   Endo/Other  diabetes  Renal/GU      Musculoskeletal   Abdominal   Peds  Hematology   Anesthesia Other Findings Called emergently to cath lab PACU for intubation.   Reproductive/Obstetrics                           Anesthesia Physical Anesthesia Plan  ASA: V and emergent  Anesthesia Plan:    Post-op Pain Management:    Induction:   Airway Management Planned:   Additional Equipment:   Intra-op Plan:   Post-operative Plan:   Informed Consent:   Plan Discussed with:   Anesthesia Plan Comments:         Anesthesia Quick Evaluation

## 2013-04-05 NOTE — H&P (View-Only) (Signed)
   Subjective:  No CP/SOB  Objective:  Temp:  [97 F (36.1 C)-99.2 F (37.3 C)] 97.6 F (36.4 C) (02/13 0800) Pulse Rate:  [65-82] 71 (02/13 0800) Resp:  [13-24] 13 (02/13 0800) BP: (78-125)/(45-74) 96/53 mmHg (02/13 0800) SpO2:  [92 %-100 %] 100 % (02/13 0800) Weight:  [169 lb 12.1 oz (77 kg)-173 lb 1 oz (78.5 kg)] 173 lb 1 oz (78.5 kg) (02/13 0500) Weight change:   Intake/Output from previous day: 02/12 0701 - 02/13 0700 In: 228 [I.V.:228] Out: 0   Intake/Output from this shift:    Physical Exam: General appearance: alert and no distress Neck: no adenopathy, no carotid bruit, no JVD, supple, symmetrical, trachea midline and thyroid not enlarged, symmetric, no tenderness/mass/nodules Lungs: clear to auscultation bilaterally Heart: regular rate and rhythm, S1, S2 normal, no murmur, click, rub or gallop Extremities: extremities normal, atraumatic, no cyanosis or edema  Lab Results: Results for orders placed during the hospital encounter of 04/18/2013 (from the past 48 hour(s))  CBC WITH DIFFERENTIAL     Status: Abnormal   Collection Time    03/30/2013 12:22 PM      Result Value Ref Range   WBC 10.2  4.0 - 10.5 K/uL   RBC 4.15  3.87 - 5.11 MIL/uL   Hemoglobin 11.4 (*) 12.0 - 15.0 g/dL   HCT 35.4 (*) 36.0 - 46.0 %   MCV 85.3  78.0 - 100.0 fL   MCH 27.5  26.0 - 34.0 pg   MCHC 32.2  30.0 - 36.0 g/dL   RDW 16.3 (*) 11.5 - 15.5 %   Platelets 151  150 - 400 K/uL   Neutrophils Relative % 73  43 - 77 %   Neutro Abs 7.4  1.7 - 7.7 K/uL   Lymphocytes Relative 17  12 - 46 %   Lymphs Abs 1.7  0.7 - 4.0 K/uL   Monocytes Relative 11  3 - 12 %   Monocytes Absolute 1.1 (*) 0.1 - 1.0 K/uL   Eosinophils Relative 0  0 - 5 %   Eosinophils Absolute 0.0  0.0 - 0.7 K/uL   Basophils Relative 0  0 - 1 %   Basophils Absolute 0.0  0.0 - 0.1 K/uL  POCT I-STAT TROPONIN I     Status: Abnormal   Collection Time    03/26/2013 12:37 PM      Result Value Ref Range   Troponin i, poc 9.71 (*)  0.00 - 0.08 ng/mL   Comment NOTIFIED PHYSICIAN     Comment 3       Comment: Due to the release kinetics of cTnI, a negative result within the first hours of the onset of symptoms does not rule out myocardial infarction with certainty. If myocardial infarction is still suspected, repeat the test at appropriate intervals.   POC Troponin I       Comment: Results called to HD and shown to dr Masneri  APTT     Status: Abnormal   Collection Time    04/18/2013  2:13 PM      Result Value Ref Range   aPTT 175 (*) 24 - 37 seconds   Comment:            IF BASELINE aPTT IS ELEVATED,     SUGGEST PATIENT RISK ASSESSMENT     BE USED TO DETERMINE APPROPRIATE     ANTICOAGULANT THERAPY.  PROTIME-INR     Status: Abnormal   Collection Time    04/08/2013    2:13 PM      Result Value Ref Range   Prothrombin Time 24.5 (*) 11.6 - 15.2 seconds   INR 2.29 (*) 0.00 - 1.49  TROPONIN I     Status: Abnormal   Collection Time    04/03/2013  2:46 PM      Result Value Ref Range   Troponin I 7.91 (*) <0.30 ng/mL   Comment:            Due to the release kinetics of cTnI,     a negative result within the first hours     of the onset of symptoms does not rule out     myocardial infarction with certainty.     If myocardial infarction is still suspected,     repeat the test at appropriate intervals.     CRITICAL RESULT CALLED TO, READ BACK BY AND VERIFIED WITH:     DOOLEY H,RN 04/20/2013 1614 WAYK  CK TOTAL AND CKMB     Status: Abnormal   Collection Time    04/16/2013  2:46 PM      Result Value Ref Range   Total CK 350 (*) 7 - 177 U/L   CK, MB 20.3 (*) 0.3 - 4.0 ng/mL   Comment: CRITICAL RESULT CALLED TO, READ BACK BY AND VERIFIED WITH:     DOOLEY H,RN 04/03/2013 1614 WAYK   Relative Index 5.8 (*) 0.0 - 2.5  BASIC METABOLIC PANEL     Status: Abnormal   Collection Time    04/09/2013  2:47 PM      Result Value Ref Range   Sodium 134 (*) 137 - 147 mEq/L   Potassium 6.3 (*) 3.7 - 5.3 mEq/L   Comment: NO VISIBLE HEMOLYSIS    Chloride 90 (*) 96 - 112 mEq/L   CO2 17 (*) 19 - 32 mEq/L   Glucose, Bld 364 (*) 70 - 99 mg/dL   BUN 49 (*) 6 - 23 mg/dL   Creatinine, Ser 7.81 (*) 0.50 - 1.10 mg/dL   Calcium 8.2 (*) 8.4 - 10.5 mg/dL   GFR calc non Af Amer 5 (*) >90 mL/min   GFR calc Af Amer 6 (*) >90 mL/min   Comment: (NOTE)     The eGFR has been calculated using the CKD EPI equation.     This calculation has not been validated in all clinical situations.     eGFR's persistently <90 mL/min signify possible Chronic Kidney     Disease.  GLUCOSE, CAPILLARY     Status: Abnormal   Collection Time    04/17/2013  4:08 PM      Result Value Ref Range   Glucose-Capillary 358 (*) 70 - 99 mg/dL  POCT I-STAT, CHEM 8     Status: Abnormal   Collection Time    04/07/2013  6:16 PM      Result Value Ref Range   Sodium 135 (*) 137 - 147 mEq/L   Potassium 5.1  3.7 - 5.3 mEq/L   Chloride 98  96 - 112 mEq/L   BUN 50 (*) 6 - 23 mg/dL   Creatinine, Ser 8.50 (*) 0.50 - 1.10 mg/dL   Glucose, Bld 290 (*) 70 - 99 mg/dL   Calcium, Ion 1.00 (*) 1.12 - 1.23 mmol/L   TCO2 22  0 - 100 mmol/L   Hemoglobin 12.6  12.0 - 15.0 g/dL   HCT 37.0  36.0 - 46.0 %  MRSA PCR SCREENING     Status: None   Collection   Time    03/24/2013  7:00 PM      Result Value Ref Range   MRSA by PCR NEGATIVE  NEGATIVE   Comment:            The GeneXpert MRSA Assay (FDA     approved for NASAL specimens     only), is one component of a     comprehensive MRSA colonization     surveillance program. It is not     intended to diagnose MRSA     infection nor to guide or     monitor treatment for     MRSA infections.  GLUCOSE, CAPILLARY     Status: Abnormal   Collection Time    04/19/2013  8:36 PM      Result Value Ref Range   Glucose-Capillary 174 (*) 70 - 99 mg/dL  TROPONIN I     Status: Abnormal   Collection Time    04/03/2013 10:45 PM      Result Value Ref Range   Troponin I 14.40 (*) <0.30 ng/mL   Comment:            Due to the release kinetics of cTnI,     a  negative result within the first hours     of the onset of symptoms does not rule out     myocardial infarction with certainty.     If myocardial infarction is still suspected,     repeat the test at appropriate intervals.     CRITICAL VALUE NOTED.  VALUE IS CONSISTENT WITH PREVIOUSLY REPORTED AND CALLED VALUE.  PRO B NATRIURETIC PEPTIDE     Status: Abnormal   Collection Time    03/30/2013 10:45 PM      Result Value Ref Range   Pro B Natriuretic peptide (BNP) 44743.0 (*) 0 - 125 pg/mL  HEPARIN LEVEL (UNFRACTIONATED)     Status: None   Collection Time    04/12/2013 11:00 PM      Result Value Ref Range   Heparin Unfractionated 0.36  0.30 - 0.70 IU/mL   Comment:            IF HEPARIN RESULTS ARE BELOW     EXPECTED VALUES, AND PATIENT     DOSAGE HAS BEEN CONFIRMED,     SUGGEST FOLLOW UP TESTING     OF ANTITHROMBIN III LEVELS.  GLUCOSE, CAPILLARY     Status: Abnormal   Collection Time    04/16/2013 12:11 AM      Result Value Ref Range   Glucose-Capillary 125 (*) 70 - 99 mg/dL  GLUCOSE, CAPILLARY     Status: None   Collection Time    04/18/2013  4:01 AM      Result Value Ref Range   Glucose-Capillary 76  70 - 99 mg/dL  HEPARIN LEVEL (UNFRACTIONATED)     Status: Abnormal   Collection Time    03/27/2013  4:39 AM      Result Value Ref Range   Heparin Unfractionated >2.20 (*) 0.30 - 0.70 IU/mL   Comment: RESULTS CONFIRMED BY MANUAL DILUTION                IF HEPARIN RESULTS ARE BELOW     EXPECTED VALUES, AND PATIENT     DOSAGE HAS BEEN CONFIRMED,     SUGGEST FOLLOW UP TESTING     OF ANTITHROMBIN III LEVELS.  CBC     Status: Abnormal   Collection Time    03/25/2013  4:39 AM        Result Value Ref Range   WBC 10.0  4.0 - 10.5 K/uL   RBC 3.84 (*) 3.87 - 5.11 MIL/uL   Hemoglobin 10.5 (*) 12.0 - 15.0 g/dL   HCT 32.5 (*) 36.0 - 46.0 %   MCV 84.6  78.0 - 100.0 fL   MCH 27.3  26.0 - 34.0 pg   MCHC 32.3  30.0 - 36.0 g/dL   RDW 16.3 (*) 11.5 - 15.5 %   Platelets 135 (*) 150 - 400 K/uL  RENAL  FUNCTION PANEL     Status: Abnormal   Collection Time    03/31/2013  4:39 AM      Result Value Ref Range   Sodium 138  137 - 147 mEq/L   Potassium 3.2 (*) 3.7 - 5.3 mEq/L   Comment: DELTA CHECK NOTED   Chloride 95 (*) 96 - 112 mEq/L   CO2 26  19 - 32 mEq/L   Glucose, Bld 59 (*) 70 - 99 mg/dL   BUN 18  6 - 23 mg/dL   Comment: DELTA CHECK NOTED   Creatinine, Ser 4.06 (*) 0.50 - 1.10 mg/dL   Comment: DELTA CHECK NOTED   Calcium 8.2 (*) 8.4 - 10.5 mg/dL   Phosphorus 3.0  2.3 - 4.6 mg/dL   Albumin 3.6  3.5 - 5.2 g/dL   GFR calc non Af Amer 11 (*) >90 mL/min   GFR calc Af Amer 13 (*) >90 mL/min   Comment: (NOTE)     The eGFR has been calculated using the CKD EPI equation.     This calculation has not been validated in all clinical situations.     eGFR's persistently <90 mL/min signify possible Chronic Kidney     Disease.  BASIC METABOLIC PANEL     Status: Abnormal   Collection Time    04/03/2013  4:39 AM      Result Value Ref Range   Sodium 140  137 - 147 mEq/L   Potassium 3.2 (*) 3.7 - 5.3 mEq/L   Chloride 96  96 - 112 mEq/L   CO2 24  19 - 32 mEq/L   Glucose, Bld 60 (*) 70 - 99 mg/dL   BUN 18  6 - 23 mg/dL   Creatinine, Ser 3.98 (*) 0.50 - 1.10 mg/dL   Calcium 8.0 (*) 8.4 - 10.5 mg/dL   GFR calc non Af Amer 11 (*) >90 mL/min   GFR calc Af Amer 13 (*) >90 mL/min   Comment: (NOTE)     The eGFR has been calculated using the CKD EPI equation.     This calculation has not been validated in all clinical situations.     eGFR's persistently <90 mL/min signify possible Chronic Kidney     Disease.  PROTIME-INR     Status: Abnormal   Collection Time    04/18/2013  4:39 AM      Result Value Ref Range   Prothrombin Time 52.8 (*) 11.6 - 15.2 seconds   INR 6.27 (*) 0.00 - 1.49   Comment: REPEATED TO VERIFY     CRITICAL RESULT CALLED TO, READ BACK BY AND VERIFIED WITH:     E. GOODEN (RN) [id number] 04/10/2013 L. LOMAX  LIPID PANEL     Status: None   Collection Time    03/26/2013  4:39 AM       Result Value Ref Range   Cholesterol 118  0 - 200 mg/dL   Triglycerides 108  <150 mg/dL   HDL 42  >39 mg/dL     Total CHOL/HDL Ratio 2.8     VLDL 22  0 - 40 mg/dL   LDL Cholesterol 54  0 - 99 mg/dL   Comment:            Total Cholesterol/HDL:CHD Risk     Coronary Heart Disease Risk Table                         Men   Women      1/2 Average Risk   3.4   3.3      Average Risk       5.0   4.4      2 X Average Risk   9.6   7.1      3 X Average Risk  23.4   11.0                Use the calculated Patient Ratio     above and the CHD Risk Table     to determine the patient's CHD Risk.                ATP III CLASSIFICATION (LDL):      <100     mg/dL   Optimal      100-129  mg/dL   Near or Above                        Optimal      130-159  mg/dL   Borderline      160-189  mg/dL   High      >190     mg/dL   Very High    Imaging: Imaging results have been reviewed  Assessment/Plan:   1. Principal Problem: 2.   NSTEMI (non-ST elevated myocardial infarction), Troponin 9.75 3. Active Problems: 4.   ESRD (end stage renal disease) on dialysis 5.   Diabetes mellitus, type 2 6.   CAD (coronary artery disease) of artery bypass graft 7.   Tardive dyskinesia 8.   Time Spent Directly with Patient:  20 minutes Trop peaked at 14. No CP/SOB. K+ decreased to 3.2 (needs careful repletion). Exam benign. For cath today. Will need to coordinate HD afterwards.  Length of Stay:  LOS: 1 day    Neo Yepiz J 04/15/2013, 8:12 AM 

## 2013-04-05 NOTE — Progress Notes (Signed)
eLink Physician-Brief Progress Note Patient Name: Faith AyeBarbara J [name] DOB: 07/22/1955 MRN: [id number]  Date of Service  04/07/2013   HPI/Events of Note   Acidemia related to chronic renal failure, arrest earlier today, and shock.  7.20/33/125/13.  Patient already on bicarb drip.  eICU Interventions  Start CRRT.     Intervention Category Major Interventions: Acid-Base disturbance - evaluation and management  Henry RusselSMITH, Willia Lampert, P 04/10/2013, 9:49 PM

## 2013-04-05 NOTE — CV Procedure (Addendum)
     Left Heart Catheterization with Coronary and Bypass angiography Report  Faith AyeBarbara J [name]  58 y.o.  female 10-23-1955  Procedure Date: 08/10/2013  Referring Physician: Erlene QuanJ. Berry, MD Primary Cardiologist:: Erlene QuanJ. Berry, MD  INDICATIONS: ACS.   PROCEDURE: 1. Left heart catheterization; 2. Coronary angiography; 3. Left ventriculography; 4. Bypass graft angiography including internal mammary  CONSENT:  The risks, benefits, and details of the procedure were explained in detail to the patient. Risks including death, stroke, heart attack, kidney injury, allergy, limb ischemia, bleeding and radiation injury were discussed.  The patient verbalized understanding and wanted to proceed.  Informed written consent was obtained.  PROCEDURE TECHNIQUE:  After Xylocaine anesthesia a 5 French sheath was placed in the left femoral artery with a single anterior needle wall stick.  Coronary angiography was done using a 5 French A2 MP and IMA catheters.  Left ventriculography was done using the 5 French A2 MP catheter and hand injection.   Hemostasis was achieved with manual compression  MEDICATIONS: None for sedation   CONTRAST:  Total of 100 cc.  COMPLICATIONS:  None   HEMODYNAMICS:  Aortic pressure 91/49 mmHg; LV pressure 92/8 mmHg; LVEDP 21 mm mercury  ANGIOGRAPHIC DATA:   The left main coronary artery is patent. It arises posteriorly in the left sinus of Valsalva..  The left anterior descending artery is totally occluded in the proximal segment after the origin of the first diagonal branch.  The ramus intermedius is small but patent  The left circumflex artery is totally occluded in the mid vessel after the origin of the left atrial recurrent.  The right coronary artery is totally occluded in the ostial segment.Marland Kitchen.  BYPASS GRAFT ANGIOGRAPHY:   The saphenous vein graft to the obtuse marginal is patent with mid vessel irregularities noted.  The saphenous vein graft to the right coronary is  patent. The stent in the proximal graft has moderate ISR obstruction the vessel but less than 50%. The distal vessel is free of significant/focal obstruction.  The left internal mammary graft to the LAD is widely patent   LEFT VENTRICULOGRAM:  Left ventricular angiogram was done in the 30 RAO projection and revealed a normal to mildly dilated left ventricular cavity. Mild inferoapical deformity is noted. Overall contractility is normal with an estimated ejection fraction of 60%   IMPRESSIONS:  1. Severe native vessel coronary disease with total occlusion of the proximal to mid LAD, occlusion of the circumflex in its mid-segment, and total occlusion of the proximal RCA  2. Patent SVG to the right coronary with mild to moderate in-stent restenosis in the proximal segment.  3. Patent saphenous vein graft to the obtuse marginal  4. Patent left internal mammary graft to the LAD  5. Preserved left ventricular systolic function   RECOMMENDATION:  Per the treatment team and nephrology service.

## 2013-04-05 NOTE — Procedures (Signed)
Procedure:  CV catheter placement Findings:  7 Fr Arrow TL CVC placed via left common femoral vein.  Tip in common iliac vein.  OK to use.

## 2013-04-05 NOTE — Progress Notes (Signed)
8:30 am CBG 69. Per hypoglycemia protocol oral glucose given. Pt AAO x 3 and without complaint at this time. Pt arousable but groggy.   11:30 BPs remain in 80s systolic. Per chart pressures were in 80-90s systolic yesterday as well. CBG 76. Nephology MD at bedside. No new orders.   12:00 MD Katrinka BlazingSmith III at bedside to eval pt pre-cath. Pt lethargic, arousable and oriented. Reports "hot flashes and fatigue." PT denies CP, SOB, nausea. Amp D50 [facility]. Pt reports feeling "a little better, like I'm coming back around" after D50.

## 2013-04-05 NOTE — Progress Notes (Addendum)
LATE ENTRY: The catheterization procedure was performed this morning after initially being canceled due to lethargy and low blood pressure. The treating team (Dr. Allyson SabalBerry) felt that proceeding with catheterization was needed and CAD to be the source of her hypotension.  The catheterization performed without sedation, demonstrated  stable anatomy compared to 2011.  Following the coronary angiography and after the sheath pull with good hemostasis in the cath lab recovery area, the patient stopped breathing and CPR was performed. She had chest compressions for less than 5 minutes on 2 occasions. Epinephrine would improve blood pressure and the patient would become responsive and start having spontaneous respirations. With recurring episodes of apnea, I had her electively intubated by anesthesia.  In speaking with her husband after the procedure, he has noted decreased mental alertness for the prior 3 days before admission to the hospital. Additionally on Wednesday he states that the nurses from the [facility] called and stated that she had a "seizure-like problem" where she fainted and had shaking. I'm not sure that any of her attending physicians were given that history.  I have spoken to the husband and to the sister and informed them of her grave condition. I've also consulted critical care medicine, neurology, and let the attending cardiology service know of the outcome to this point.

## 2013-04-05 NOTE — Progress Notes (Signed)
Pt went to cath lab today, had cardiac arrest requiring CPR, now intubated in CCU, BP 80's.  No interventions, per notes the bypass grafts and LIMA to LAD were open.  Acidotic, plan CRRT , see orders.   Vinson Moselleob Skanda Worlds MD (pgr) [phone]    ([phone]) [phone] 04/14/2013, 4:41 PM

## 2013-04-05 NOTE — Consult Note (Signed)
NEURO HOSPITALIST CONSULT NOTE    Reason for Consult: altered mental status  HPI:                                                                                                                                          Faith [name] is an 58 y.o. female with a myriad of medical problems including HTN, DM type 2, hyperlipidemia, CAD s/p CABG, MI, chronic diastolic congestive heart failure, PAD, stroke with residual left hemiparesis, PE, ESRD on dialysis, tardive dyskinesia, initially evaluated at [facility] ED due to generalized weakness, and work up revealing NSTEMI. Pt taken to cath lab 2/13 and suffered cardiac arrest post cath. Currently on IV heparin. Sedated with fentanyl and versed. Other issues include episodes of profound hypotension followed by significantly elevated SBP, metabolic acidosis due to chronic renal failure, acute respiratory failure.   Found to have altered mental status and neurology consult requested. Patient intubated in the vent and thus unable to obtain further history. Nursing staff report some spontaneous movement of the left arm. Serologies significant for WBC 14.1, Cr 4.89, lactic acid 10.8 CT brain pending.  Past Medical History  Diagnosis Date  . Hyperparathyroidism, secondary renal   . CAD (coronary artery disease) of artery bypass graft     CABG in 2002  . PAD (peripheral artery disease)     Not a candidate for revascularization  . Chronic constipation   . GERD (gastroesophageal reflux disease)   . Anemia of chronic renal failure   . Tardive dyskinesia     Secondary to Reglan  . Depression   . Neuropathy   . Diabetic retinopathy   . Diastolic CHF, chronic     Grade 2  . Pulmonary embolism 06/2003  . Hyperlipidemia   . Stroke 2002    "w/left side" residual weakness  . Diabetes mellitus     type II  . CHF (congestive heart failure)   . CVA (cerebral infarction)     right brain with minimal left hemiparesis  . Pneumonia 10/2009;  11/2009    ?pneumonia 01/17/11  . Hypertension   . Arthritis   . Complication of anesthesia     "sleepy afterwards; due to her being dialysis pt"  . Myocardial infarction 2002  . ESRD on dialysis     Jeneen Rinks; M, W, F; via right upper arm AVF (01/18/2012)    Past Surgical History  Procedure Laterality Date  . Coronary artery bypass graft  2002  . Laparoscopic cholecystectomy  2002  . Av fistula placement      times 4-using rt upper arm graft-old rt lower arm,lt upper and lower old grafts  . Trigger finger release    . Tubal ligation    . Eye examination under anesthesia w/ retinal cryotherapy  and retinal laser    . Cataract extraction  07/2010    right sided   . Toe amputation  5/12    left 2nd toe secondary to osteomyelitis  . Breast biopsy  06/2010  . Carpal tunnel release  05/05/2011    Procedure: CARPAL TUNNEL RELEASE;  Surgeon: Linna Hoff, MD;  Location: [place];  Service: Orthopedics;  Laterality: Right;  . Coronary angioplasty with stent placement    . Amputation  11/24/2011    Procedure: AMPUTATION RAY;  Surgeon: Newt Minion, MD;  Location: [place];  Service: Orthopedics;  Laterality: Right;  Right Foot 2nd Ray Amputation  . Amputation  12/12/2011    Procedure: AMPUTATION BELOW KNEE;  Surgeon: Newt Minion, MD;  Location: [place];  Service: Orthopedics;  Laterality: Right;  . 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 Amputation  01/17/2012    Procedure: AMPUTATION ABOVE KNEE;  Surgeon: Newt Minion, MD;  Location: [place];  Service: Orthopedics;  Laterality: Right;  Right Above Knee Amputation  . I&d extremity  02/15/2012    Procedure: IRRIGATION AND DEBRIDEMENT EXTREMITY;  Surgeon: Mcarthur Rossetti, MD;  Location: [place];  Service: Orthopedics;  Laterality: Right;  . Thrombectomy and revision of arterioventous (av) goretex  graft Right 04/02/2013    Procedure: THROMBECTOMY AND REVISION OF ARTERIOVENTOUS (AV) GORETEX  GRAFT/RIGHT UPPER  ARM;  Surgeon: Elam Dutch, MD;  Location: [place];  Service: Vascular;  Laterality: Right;  . Insertion of dialysis catheter Right 04/02/2013    Procedure: INSERTION OF DIALYSIS CATHETER FEMORAL;  Surgeon: Elam Dutch, MD;  Location: [place];  Service: Vascular;  Laterality: Right;    Family History  Problem Relation Age of Onset  . Diabetes Mother   . Cirrhosis Mother   . Diabetes Father   . Heart attack Father   . Hyperlipidemia Father   . Heart disease Father   . Diabetes Sister   . Hyperlipidemia Sister   . Hypertension Sister   . Diabetes Sister   . Diabetes Sister   . Heart disease Brother   . Hyperlipidemia Brother     Social History:  reports that she has never smoked. She has never used smokeless tobacco. She reports that she does not drink alcohol or use illicit drugs.  Allergies  Allergen Reactions  . Lipitor [Atorvastatin Calcium] Other (See Comments)    Muscle weakness  . Codeine Nausea And Vomiting  . Metoclopramide Hcl Other (See Comments)    Tardive dyskinesia  . Iohexol Itching    Facility where pt lives does not have this listed as an allergy and she has not been pre-medicated in the past  . Oxycodone Hypertension    MEDICATIONS:                                                                                                                     I have reviewed the patient's current medications.   ROS:  unable to obtain due to mental status.                                                                                                                     History obtained from chart review  Physical exam: sedated, intubated on the vent. Blood pressure 137/40, pulse 88, temperature 98.5 F (36.9 C), temperature source Oral, resp. rate 28, height '5\' 4"'  (1.626 m), weight 78.5 kg (173 lb 1 oz), SpO2 95.00%. Head: normocephalic. Neck: supple, no bruits, no JVD. Cardiac: no murmurs. Lungs: clear. Abdomen: soft, no tender, no mass. Extremities: s/p  right AKA. Edema left LE  Neurologic Examination:  Limited due to sedation.                                                                                                    Mental Status: sedated, intubated on the vent.  Cranial Nerves: pupils 4 mm bilaterally, poorly reactive to light. No gaze preference. Doesn't blink to threat. Face appears to be symmetric. Tongue: intubated. Motor: No spontaneous or pain induced movements, but patient sedated. Sensory: no reaction to pain, but sedated. Deep Tendon Reflexes:  Couldn't elicit. Plantars: Right: s/p AKA   Left: mute Cerebellar: Unable to test. Gait:  Unable to test.  Lab Results  Component Value Date/Time   CHOL 118 03/25/2013  4:39 AM    Results for orders placed during the hospital encounter of 03/31/2013 (from the past 48 hour(s))  CBC WITH DIFFERENTIAL     Status: Abnormal   Collection Time    04/06/2013 12:22 PM      Result Value Ref Range   WBC 10.2  4.0 - 10.5 K/uL   RBC 4.15  3.87 - 5.11 MIL/uL   Hemoglobin 11.4 (*) 12.0 - 15.0 g/dL   HCT 35.4 (*) 36.0 - 46.0 %   MCV 85.3  78.0 - 100.0 fL   MCH 27.5  26.0 - 34.0 pg   MCHC 32.2  30.0 - 36.0 g/dL   RDW 16.3 (*) 11.5 - 15.5 %   Platelets 151  150 - 400 K/uL   Neutrophils Relative % 73  43 - 77 %   Neutro Abs 7.4  1.7 - 7.7 K/uL   Lymphocytes Relative 17  12 - 46 %   Lymphs Abs 1.7  0.7 - 4.0 K/uL   Monocytes Relative 11  3 - 12 %   Monocytes Absolute 1.1 (*) 0.1 - 1.0 K/uL   Eosinophils Relative 0  0 - 5 %   Eosinophils Absolute 0.0  0.0 - 0.7 K/uL   Basophils Relative  0  0 - 1 %   Basophils Absolute 0.0  0.0 - 0.1 K/uL  POCT I-STAT TROPONIN I     Status: Abnormal   Collection Time    04/15/2013 12:37 PM      Result Value Ref Range   Troponin i, poc 9.71 (*) 0.00 - 0.08 ng/mL   Comment NOTIFIED PHYSICIAN     Comment 3       Comment: Due to the release kinetics of cTnI, a negative result within the first hours of the onset of symptoms does not rule out myocardial  infarction with certainty. If myocardial infarction is still suspected, repeat the test at appropriate intervals.   POC Troponin I       Comment: Results called to HD and shown to dr Curly Rim  APTT     Status: Abnormal   Collection Time    03/24/2013  2:13 PM      Result Value Ref Range   aPTT 175 (*) 24 - 37 seconds   Comment:            IF BASELINE aPTT IS ELEVATED,     SUGGEST PATIENT RISK ASSESSMENT     BE USED TO DETERMINE APPROPRIATE     ANTICOAGULANT THERAPY.  PROTIME-INR     Status: Abnormal   Collection Time    04/03/2013  2:13 PM      Result Value Ref Range   Prothrombin Time 24.5 (*) 11.6 - 15.2 seconds   INR 2.29 (*) 0.00 - 1.49  TROPONIN I     Status: Abnormal   Collection Time    03/31/2013  2:46 PM      Result Value Ref Range   Troponin I 7.91 (*) <0.30 ng/mL   Comment:            Due to the release kinetics of cTnI,     a negative result within the first hours     of the onset of symptoms does not rule out     myocardial infarction with certainty.     If myocardial infarction is still suspected,     repeat the test at appropriate intervals.     CRITICAL RESULT CALLED TO, READ BACK BY AND VERIFIED WITH:     DOOLEY H,RN 04/17/2013 1614 WAYK  CK TOTAL AND CKMB     Status: Abnormal   Collection Time    04/09/2013  2:46 PM      Result Value Ref Range   Total CK 350 (*) 7 - 177 U/L   CK, MB 20.3 (*) 0.3 - 4.0 ng/mL   Comment: CRITICAL RESULT CALLED TO, READ BACK BY AND VERIFIED WITH:     DOOLEY H,RN 04/02/2013 1614 WAYK   Relative Index 5.8 (*) 0.0 - 2.5  BASIC METABOLIC PANEL     Status: Abnormal   Collection Time    04/18/2013  2:47 PM      Result Value Ref Range   Sodium 134 (*) 137 - 147 mEq/L   Potassium 6.3 (*) 3.7 - 5.3 mEq/L   Comment: NO VISIBLE HEMOLYSIS   Chloride 90 (*) 96 - 112 mEq/L   CO2 17 (*) 19 - 32 mEq/L   Glucose, Bld 364 (*) 70 - 99 mg/dL   BUN 49 (*) 6 - 23 mg/dL   Creatinine, Ser 7.81 (*) 0.50 - 1.10 mg/dL   Calcium 8.2 (*) 8.4 - 10.5 mg/dL    GFR calc non Af Amer 5 (*) >90 mL/min  GFR calc Af Amer 6 (*) >90 mL/min   Comment: (NOTE)     The eGFR has been calculated using the CKD EPI equation.     This calculation has not been validated in all clinical situations.     eGFR's persistently <90 mL/min signify possible Chronic Kidney     Disease.  GLUCOSE, CAPILLARY     Status: Abnormal   Collection Time    04/08/2013  4:08 PM      Result Value Ref Range   Glucose-Capillary 358 (*) 70 - 99 mg/dL  POCT I-STAT, CHEM 8     Status: Abnormal   Collection Time    03/30/2013  6:16 PM      Result Value Ref Range   Sodium 135 (*) 137 - 147 mEq/L   Potassium 5.1  3.7 - 5.3 mEq/L   Chloride 98  96 - 112 mEq/L   BUN 50 (*) 6 - 23 mg/dL   Creatinine, Ser 8.50 (*) 0.50 - 1.10 mg/dL   Glucose, Bld 290 (*) 70 - 99 mg/dL   Calcium, Ion 1.00 (*) 1.12 - 1.23 mmol/L   TCO2 22  0 - 100 mmol/L   Hemoglobin 12.6  12.0 - 15.0 g/dL   HCT 37.0  36.0 - 46.0 %  MRSA PCR SCREENING     Status: None   Collection Time    04/20/2013  7:00 PM      Result Value Ref Range   MRSA by PCR NEGATIVE  NEGATIVE   Comment:            The GeneXpert MRSA Assay (FDA     approved for NASAL specimens     only), is one component of a     comprehensive MRSA colonization     surveillance program. It is not     intended to diagnose MRSA     infection nor to guide or     monitor treatment for     MRSA infections.  GLUCOSE, CAPILLARY     Status: Abnormal   Collection Time    04/01/2013  8:36 PM      Result Value Ref Range   Glucose-Capillary 174 (*) 70 - 99 mg/dL  TROPONIN I     Status: Abnormal   Collection Time    03/31/2013 10:45 PM      Result Value Ref Range   Troponin I 14.40 (*) <0.30 ng/mL   Comment:            Due to the release kinetics of cTnI,     a negative result within the first hours     of the onset of symptoms does not rule out     myocardial infarction with certainty.     If myocardial infarction is still suspected,     repeat the test at  appropriate intervals.     CRITICAL VALUE NOTED.  VALUE IS CONSISTENT WITH PREVIOUSLY REPORTED AND CALLED VALUE.  PRO B NATRIURETIC PEPTIDE     Status: Abnormal   Collection Time    04/02/2013 10:45 PM      Result Value Ref Range   Pro B Natriuretic peptide (BNP) 44743.0 (*) 0 - 125 pg/mL  HEMOGLOBIN A1C     Status: Abnormal   Collection Time    04/04/13 10:45 PM      Result Value Ref Range   Hemoglobin A1C 10.7 (*) <5.7 %   Comment: (NOTE)  According to the ADA Clinical Practice Recommendations for 2011, when     HbA1c is used as a screening test:      >=6.5%   Diagnostic of Diabetes Mellitus               (if abnormal result is confirmed)     5.7-6.4%   Increased risk of developing Diabetes Mellitus     References:Diagnosis and Classification of Diabetes Mellitus,Diabetes     PPIR,5188,41(YSAYT 1):S62-S69 and Standards of Medical Care in             Diabetes - 2011,Diabetes KZSW,1093,23 (Suppl 1):S11-S61.   Mean Plasma Glucose 260 (*) <117 mg/dL   Comment: Performed at [facility] (UNFRACTIONATED)     Status: None   Collection Time    03/27/2013 11:00 PM      Result Value Ref Range   Heparin Unfractionated 0.36  0.30 - 0.70 IU/mL   Comment:            IF HEPARIN RESULTS ARE BELOW     EXPECTED VALUES, AND PATIENT     DOSAGE HAS BEEN CONFIRMED,     SUGGEST FOLLOW UP TESTING     OF ANTITHROMBIN III LEVELS.  GLUCOSE, CAPILLARY     Status: Abnormal   Collection Time    04/19/2013 12:11 AM      Result Value Ref Range   Glucose-Capillary 125 (*) 70 - 99 mg/dL  GLUCOSE, CAPILLARY     Status: None   Collection Time    04/06/2013  4:01 AM      Result Value Ref Range   Glucose-Capillary 76  70 - 99 mg/dL  HEPARIN LEVEL (UNFRACTIONATED)     Status: Abnormal   Collection Time    03/27/2013  4:39 AM      Result Value Ref Range   Heparin Unfractionated >2.20 (*) 0.30 - 0.70 IU/mL   Comment:  RESULTS CONFIRMED BY MANUAL DILUTION                IF HEPARIN RESULTS ARE BELOW     EXPECTED VALUES, AND PATIENT     DOSAGE HAS BEEN CONFIRMED,     SUGGEST FOLLOW UP TESTING     OF ANTITHROMBIN III LEVELS.  CBC     Status: Abnormal   Collection Time    04/20/2013  4:39 AM      Result Value Ref Range   WBC 10.0  4.0 - 10.5 K/uL   RBC 3.84 (*) 3.87 - 5.11 MIL/uL   Hemoglobin 10.5 (*) 12.0 - 15.0 g/dL   HCT 32.5 (*) 36.0 - 46.0 %   MCV 84.6  78.0 - 100.0 fL   MCH 27.3  26.0 - 34.0 pg   MCHC 32.3  30.0 - 36.0 g/dL   RDW 16.3 (*) 11.5 - 15.5 %   Platelets 135 (*) 150 - 400 K/uL  RENAL FUNCTION PANEL     Status: Abnormal   Collection Time    04/15/2013  4:39 AM      Result Value Ref Range   Sodium 138  137 - 147 mEq/L   Potassium 3.2 (*) 3.7 - 5.3 mEq/L   Comment: DELTA CHECK NOTED   Chloride 95 (*) 96 - 112 mEq/L   CO2 26  19 - 32 mEq/L   Glucose, Bld 59 (*) 70 - 99 mg/dL   BUN 18  6 - 23 mg/dL   Comment: DELTA CHECK NOTED   Creatinine, Ser 4.06 (*)  0.50 - 1.10 mg/dL   Comment: DELTA CHECK NOTED   Calcium 8.2 (*) 8.4 - 10.5 mg/dL   Phosphorus 3.0  2.3 - 4.6 mg/dL   Albumin 3.6  3.5 - 5.2 g/dL   GFR calc non Af Amer 11 (*) >90 mL/min   GFR calc Af Amer 13 (*) >90 mL/min   Comment: (NOTE)     The eGFR has been calculated using the CKD EPI equation.     This calculation has not been validated in all clinical situations.     eGFR's persistently <90 mL/min signify possible Chronic Kidney     Disease.  BASIC METABOLIC PANEL     Status: Abnormal   Collection Time    04/08/2013  4:39 AM      Result Value Ref Range   Sodium 140  137 - 147 mEq/L   Potassium 3.2 (*) 3.7 - 5.3 mEq/L   Chloride 96  96 - 112 mEq/L   CO2 24  19 - 32 mEq/L   Glucose, Bld 60 (*) 70 - 99 mg/dL   BUN 18  6 - 23 mg/dL   Creatinine, Ser 3.98 (*) 0.50 - 1.10 mg/dL   Calcium 8.0 (*) 8.4 - 10.5 mg/dL   GFR calc non Af Amer 11 (*) >90 mL/min   GFR calc Af Amer 13 (*) >90 mL/min   Comment: (NOTE)     The eGFR  has been calculated using the CKD EPI equation.     This calculation has not been validated in all clinical situations.     eGFR's persistently <90 mL/min signify possible Chronic Kidney     Disease.  PROTIME-INR     Status: Abnormal   Collection Time    04/08/2013  4:39 AM      Result Value Ref Range   Prothrombin Time 52.8 (*) 11.6 - 15.2 seconds   INR 6.27 (*) 0.00 - 1.49   Comment: REPEATED TO VERIFY     CRITICAL RESULT CALLED TO, READ BACK BY AND VERIFIED WITH:     Dulcy Fanny (RN) [phone] 04/04/2013 [facility]  LIPID PANEL     Status: None   Collection Time    04/19/2013  4:39 AM      Result Value Ref Range   Cholesterol 118  0 - 200 mg/dL   Triglycerides 108  <150 mg/dL   HDL 42  >39 mg/dL   Total CHOL/HDL Ratio 2.8     VLDL 22  0 - 40 mg/dL   LDL Cholesterol 54  0 - 99 mg/dL   Comment:            Total Cholesterol/HDL:CHD Risk     Coronary Heart Disease Risk Table                         Men   Women      1/2 Average Risk   3.4   3.3      Average Risk       5.0   4.4      2 X Average Risk   9.6   7.1      3 X Average Risk  23.4   11.0                Use the calculated Patient Ratio     above and the CHD Risk Table     to determine the patient's CHD Risk.  ATP III CLASSIFICATION (LDL):      <100     mg/dL   Optimal      100-129  mg/dL   Near or Above                        Optimal      130-159  mg/dL   Borderline      160-189  mg/dL   High      >190     mg/dL   Very High  GLUCOSE, CAPILLARY     Status: Abnormal   Collection Time    04/16/2013  8:22 AM      Result Value Ref Range   Glucose-Capillary 69 (*) 70 - 99 mg/dL   Comment 1 Notify RN    TROPONIN I     Status: Abnormal   Collection Time    04/16/2013  8:25 AM      Result Value Ref Range   Troponin I 10.26 (*) <0.30 ng/mL   Comment:            Due to the release kinetics of cTnI,     a negative result within the first hours     of the onset of symptoms does not rule out     myocardial infarction with  certainty.     If myocardial infarction is still suspected,     repeat the test at appropriate intervals.     CRITICAL VALUE NOTED.  VALUE IS CONSISTENT WITH PREVIOUSLY REPORTED AND CALLED VALUE.  PROTIME-INR     Status: Abnormal   Collection Time    03/30/2013  8:25 AM      Result Value Ref Range   Prothrombin Time 20.7 (*) 11.6 - 15.2 seconds   INR 1.84 (*) 0.00 - 1.49  GLUCOSE, CAPILLARY     Status: None   Collection Time    03/24/2013  9:03 AM      Result Value Ref Range   Glucose-Capillary 72  70 - 99 mg/dL  HEPARIN LEVEL (UNFRACTIONATED)     Status: Abnormal   Collection Time    04/04/2013  9:30 AM      Result Value Ref Range   Heparin Unfractionated 0.15 (*) 0.30 - 0.70 IU/mL   Comment:            IF HEPARIN RESULTS ARE BELOW     EXPECTED VALUES, AND PATIENT     DOSAGE HAS BEEN CONFIRMED,     SUGGEST FOLLOW UP TESTING     OF ANTITHROMBIN III LEVELS.     RESULTS VERIFIED VIA RECOLLECT  GLUCOSE, CAPILLARY     Status: None   Collection Time    04/20/2013 11:01 AM      Result Value Ref Range   Glucose-Capillary 76  70 - 99 mg/dL  GLUCOSE, CAPILLARY     Status: Abnormal   Collection Time    04/10/2013 12:25 PM      Result Value Ref Range   Glucose-Capillary 133 (*) 70 - 99 mg/dL  TROPONIN I     Status: Abnormal   Collection Time    04/18/2013 12:45 PM      Result Value Ref Range   Troponin I 8.78 (*) <0.30 ng/mL   Comment:            Due to the release kinetics of cTnI,     a negative result within the first hours     of the onset of symptoms  does not rule out     myocardial infarction with certainty.     If myocardial infarction is still suspected,     repeat the test at appropriate intervals.     CRITICAL VALUE NOTED.  VALUE IS CONSISTENT WITH PREVIOUSLY REPORTED AND CALLED VALUE.  GLUCOSE, CAPILLARY     Status: Abnormal   Collection Time    04/01/2013  2:29 PM      Result Value Ref Range   Glucose-Capillary 144 (*) 70 - 99 mg/dL  POCT I-STAT, CHEM 8     Status: Abnormal    Collection Time    03/31/2013  3:53 PM      Result Value Ref Range   Sodium 136 (*) 137 - 147 mEq/L   Potassium 4.0  3.7 - 5.3 mEq/L   Chloride 104  96 - 112 mEq/L   BUN 22  6 - 23 mg/dL   Creatinine, Ser 4.60 (*) 0.50 - 1.10 mg/dL   Glucose, Bld 168 (*) 70 - 99 mg/dL   Calcium, Ion 1.18  1.12 - 1.23 mmol/L   TCO2 13  0 - 100 mmol/L   Hemoglobin 13.6  12.0 - 15.0 g/dL   HCT 40.0  36.0 - 46.0 %  POCT I-STAT 3, BLOOD GAS (G3+)     Status: Abnormal   Collection Time    03/24/2013  3:55 PM      Result Value Ref Range   pH, Arterial 7.145 (*) 7.350 - 7.450   pCO2 arterial 37.9  35.0 - 45.0 mmHg   pO2, Arterial 101.0 (*) 80.0 - 100.0 mmHg   Bicarbonate 13.1 (*) 20.0 - 24.0 mEq/L   TCO2 14  0 - 100 mmol/L   O2 Saturation 96.0     Acid-base deficit 15.0 (*) 0.0 - 2.0 mmol/L   Sample type ARTERIAL     Comment NOTIFIED PHYSICIAN    TYPE AND SCREEN     Status: None   Collection Time    04/16/2013  3:55 PM      Result Value Ref Range   ABO/RH(D) AB POS     Antibody Screen NEG     Sample Expiration 04/08/2013    ABO/RH     Status: None   Collection Time    03/28/2013  3:55 PM      Result Value Ref Range   ABO/RH(D) AB POS    HEPARIN LEVEL (UNFRACTIONATED)     Status: Abnormal   Collection Time    04/18/2013  4:00 PM      Result Value Ref Range   Heparin Unfractionated <0.10 (*) 0.30 - 0.70 IU/mL   Comment:            IF HEPARIN RESULTS ARE BELOW     EXPECTED VALUES, AND PATIENT     DOSAGE HAS BEEN CONFIRMED,     SUGGEST FOLLOW UP TESTING     OF ANTITHROMBIN III LEVELS.     REPEATED TO VERIFY  CBC     Status: Abnormal   Collection Time    04/12/2013  4:00 PM      Result Value Ref Range   WBC 12.6 (*) 4.0 - 10.5 K/uL   RBC 4.18  3.87 - 5.11 MIL/uL   Hemoglobin 11.4 (*) 12.0 - 15.0 g/dL   HCT 36.7  36.0 - 46.0 %   MCV 87.8  78.0 - 100.0 fL   MCH 27.3  26.0 - 34.0 pg   MCHC 31.1  30.0 - 36.0 g/dL  RDW 16.9 (*) 11.5 - 15.5 %   Platelets 111 (*) 150 - 400 K/uL   Comment: SPECIMEN  CHECKED FOR CLOTS     REPEATED TO VERIFY     PLATELET COUNT CONFIRMED BY SMEAR  COMPREHENSIVE METABOLIC PANEL     Status: Abnormal   Collection Time    03/25/2013  4:00 PM      Result Value Ref Range   Sodium 135 (*) 137 - 147 mEq/L   Potassium 4.4  3.7 - 5.3 mEq/L   Chloride 94 (*) 96 - 112 mEq/L   CO2 11 (*) 19 - 32 mEq/L   Glucose, Bld 210 (*) 70 - 99 mg/dL   BUN 23  6 - 23 mg/dL   Creatinine, Ser 4.53 (*) 0.50 - 1.10 mg/dL   Calcium 8.8  8.4 - 10.5 mg/dL   Total Protein 7.6  6.0 - 8.3 g/dL   Albumin 3.4 (*) 3.5 - 5.2 g/dL   AST 295 (*) 0 - 37 U/L   ALT 41 (*) 0 - 35 U/L   Alkaline Phosphatase 231 (*) 39 - 117 U/L   Total Bilirubin 0.6  0.3 - 1.2 mg/dL   GFR calc non Af Amer 10 (*) >90 mL/min   GFR calc Af Amer 11 (*) >90 mL/min   Comment: (NOTE)     The eGFR has been calculated using the CKD EPI equation.     This calculation has not been validated in all clinical situations.     eGFR's persistently <90 mL/min signify possible Chronic Kidney     Disease.  PROTIME-INR     Status: Abnormal   Collection Time    03/29/2013  4:00 PM      Result Value Ref Range   Prothrombin Time 21.1 (*) 11.6 - 15.2 seconds   INR 1.89 (*) 0.00 - 1.49  POCT I-STAT 3, BLOOD GAS (G3+)     Status: Abnormal   Collection Time    04/18/2013  4:45 PM      Result Value Ref Range   pH, Arterial 7.228 (*) 7.350 - 7.450   pCO2 arterial 34.6 (*) 35.0 - 45.0 mmHg   pO2, Arterial 301.0 (*) 80.0 - 100.0 mmHg   Bicarbonate 14.5 (*) 20.0 - 24.0 mEq/L   TCO2 16  0 - 100 mmol/L   O2 Saturation 100.0     Acid-base deficit 12.0 (*) 0.0 - 2.0 mmol/L   Patient temperature 97.7 F     Collection site RADIAL, ALLEN'S TEST ACCEPTABLE     Drawn by VENIPUNCTURE     Sample type ARTERIAL    CBC     Status: Abnormal   Collection Time    04/12/2013  7:00 PM      Result Value Ref Range   WBC 14.1 (*) 4.0 - 10.5 K/uL   RBC 3.69 (*) 3.87 - 5.11 MIL/uL   Hemoglobin 10.0 (*) 12.0 - 15.0 g/dL   HCT 32.3 (*) 36.0 - 46.0 %    MCV 87.5  78.0 - 100.0 fL   MCH 27.1  26.0 - 34.0 pg   MCHC 31.0  30.0 - 36.0 g/dL   RDW 16.6 (*) 11.5 - 15.5 %   Platelets 124 (*) 150 - 400 K/uL  BASIC METABOLIC PANEL     Status: Abnormal   Collection Time    04/03/2013  7:00 PM      Result Value Ref Range   Sodium 141  137 - 147 mEq/L   Potassium 4.6  3.7 - 5.3 mEq/L   Chloride 93 (*) 96 - 112 mEq/L   CO2 20  19 - 32 mEq/L   Glucose, Bld 176 (*) 70 - 99 mg/dL   BUN 25 (*) 6 - 23 mg/dL   Creatinine, Ser 4.89 (*) 0.50 - 1.10 mg/dL   Calcium 8.0 (*) 8.4 - 10.5 mg/dL   GFR calc non Af Amer 9 (*) >90 mL/min   GFR calc Af Amer 10 (*) >90 mL/min   Comment: (NOTE)     The eGFR has been calculated using the CKD EPI equation.     This calculation has not been validated in all clinical situations.     eGFR's persistently <90 mL/min signify possible Chronic Kidney     Disease.  LACTIC ACID, PLASMA     Status: Abnormal   Collection Time    04/01/2013  7:00 PM      Result Value Ref Range   Lactic Acid, Venous 10.8 (*) 0.5 - 2.2 mmol/L  GLUCOSE, CAPILLARY     Status: Abnormal   Collection Time    04/13/2013  7:26 PM      Result Value Ref Range   Glucose-Capillary 169 (*) 70 - 99 mg/dL  POCT I-STAT 3, BLOOD GAS (G3+)     Status: Abnormal   Collection Time    04/10/2013  7:27 PM      Result Value Ref Range   pH, Arterial 7.336 (*) 7.350 - 7.450   pCO2 arterial 33.0 (*) 35.0 - 45.0 mmHg   pO2, Arterial 77.0 (*) 80.0 - 100.0 mmHg   Bicarbonate 17.7 (*) 20.0 - 24.0 mEq/L   TCO2 19  0 - 100 mmol/L   O2 Saturation 95.0     Acid-base deficit 7.0 (*) 0.0 - 2.0 mmol/L   Patient temperature 98.0 F     Sample type ARTERIAL    POCT I-STAT, CHEM 8     Status: Abnormal   Collection Time    04/07/2013  7:37 PM      Result Value Ref Range   Sodium 140  137 - 147 mEq/L   Potassium 4.7  3.7 - 5.3 mEq/L   Chloride 100  96 - 112 mEq/L   BUN 24 (*) 6 - 23 mg/dL   Creatinine, Ser 4.60 (*) 0.50 - 1.10 mg/dL   Glucose, Bld 170 (*) 70 - 99 mg/dL   Calcium,  Ion 0.92 (*) 1.12 - 1.23 mmol/L   TCO2 17  0 - 100 mmol/L   Hemoglobin 12.2  12.0 - 15.0 g/dL   HCT 36.0  36.0 - 46.0 %  PROCALCITONIN     Status: None   Collection Time    04/02/2013  7:40 PM      Result Value Ref Range   Procalcitonin 2.71     Comment:            Interpretation:     PCT > 2 ng/mL:     Systemic infection (sepsis) is likely,     unless other causes are known.     (NOTE)             ICU PCT Algorithm               Non ICU PCT Algorithm        ----------------------------     ------------------------------             PCT < 0.25 ng/mL  PCT < 0.1 ng/mL         Stopping of antibiotics            Stopping of antibiotics           strongly encouraged.               strongly encouraged.        ----------------------------     ------------------------------           PCT level decrease by               PCT < 0.25 ng/mL           >= 80% from peak PCT           OR PCT 0.25 - 0.5 ng/mL          Stopping of antibiotics                                                 encouraged.         Stopping of antibiotics               encouraged.        ----------------------------     ------------------------------           PCT level decrease by              PCT >= 0.25 ng/mL           < 80% from peak PCT            AND PCT >= 0.5 ng/mL            Continuing antibiotics                                                  encouraged.           Continuing antibiotics                encouraged.        ----------------------------     ------------------------------         PCT level increase compared          PCT > 0.5 ng/mL             with peak PCT AND              PCT >= 0.5 ng/mL             Escalation of antibiotics                                              strongly encouraged.          Escalation of antibiotics            strongly encouraged.  CARBOXYHEMOGLOBIN     Status: Abnormal   Collection Time    04/14/2013  7:48 PM      Result Value Ref Range   Total  hemoglobin 11.1 (*) 12.0 - 16.0 g/dL   O2 Saturation 78.8     Carboxyhemoglobin 1.6 (*) 0.5 - 1.5 %  Methemoglobin 1.6 (*) 0.0 - 1.5 %  POCT I-STAT 3, BLOOD GAS (G3+)     Status: Abnormal   Collection Time    04/09/2013  9:10 PM      Result Value Ref Range   pH, Arterial 7.202 (*) 7.350 - 7.450   pCO2 arterial 33.6 (*) 35.0 - 45.0 mmHg   pO2, Arterial 125.0 (*) 80.0 - 100.0 mmHg   Bicarbonate 13.3 (*) 20.0 - 24.0 mEq/L   TCO2 14  0 - 100 mmol/L   O2 Saturation 98.0     Acid-base deficit 14.0 (*) 0.0 - 2.0 mmol/L   Patient temperature 97.8 F     Collection site RADIAL, ALLEN'S TEST ACCEPTABLE     Drawn by RT     Sample type ARTERIAL      Dg Chest 2 View  04/20/2013   CLINICAL DATA:  Chest pain, shortness of breath.  EXAM: CHEST  2 VIEW  COMPARISON:  April 02, 2013.  FINDINGS: Stable cardiomegaly. Status post coronary artery bypass graft. Distal tips of dialysis catheter extending from inferior vena cava are unchanged in position within the right atrium. Stable central pulmonary vascular congestion is noted. Bilateral pulmonary edema noted on prior exam does appear to be significantly improved. No pneumothorax or significant pleural effusion is noted.  IMPRESSION: Bilateral pulmonary edema noted on prior exam does appear to be significantly improved, although mild central pulmonary vascular congestion remains.   Electronically Signed   By: Sabino Dick M.D.   On: 04/13/2013 12:13   Dg Chest Port 1 View  03/24/2013   CLINICAL DATA:  Endotracheal tube placement.  EXAM: PORTABLE CHEST - 1 VIEW  COMPARISON:  Single view of the chest 04/05/1999.  FINDINGS: Endotracheal tube is in place with the tip 1.2 cm above the carina. The tube could be withdrawn 1-2 cm for better positioning. IVC approach central venous catheter has been withdrawn with tip now in the right atrium. There is cardiomegaly and interstitial edema. No pneumothorax.  IMPRESSION: ET tube tip is 1.2 cm above the carina. The tube  could be withdrawn 1- the 2 cm for better positioning.  IVC approach central venous catheter tip is now within the right atrium.  Cardiomegaly and interstitial edema.   Electronically Signed   By: Inge Rise M.D.   On: 04/03/2013 16:34   Assessment/Plan: 58 y/o with multiple medical problems noted to have altered mental status. She is sedated, intubated on the vent and thus neuro-exam is limited. Suspect toxic-metabolic encephalopathy, but patient has multiple risk actors for stroke, severe episodes of hypotension, recent MI, and thus stroke should be in the differential. CT brain pending, but will need MRI if CT negative and mental state changes persist. EEG. Will follow up.   Dorian Pod, MD 04/01/2013, 10:21 PM [facility].

## 2013-04-05 NOTE — Consult Note (Signed)
Name: Faith [name] MRN: [id number] DOB: August 10, 1955    ADMISSION DATE:  04/15/2013 CONSULTATION DATE:  Jul 22, 2013  REFERRING MD :  Allyson SabalBerry PRIMARY SERVICE: Cardiology  CHIEF COMPLAINT:  Cardiac Arrest  BRIEF PATIENT DESCRIPTION: 58 y.o. F admitted on 2/9 for profuse bleeding from right arm graft site.  Graft was clotted, failed thrombolectomy.  Discharged from ER and sent home, presented again 2/12 for weakness.  Workup in ED with elevated troponins/NSTEMI.  Pt taken to cath lab 2/13 and suffered cardiac arrest post cath.  PCCM consulted for further management.  SIGNIFICANT EVENTS / STUDIES:  2/9 - profuse bleeding from right arm graft site. Failed thrombolectomy. 2/12 - Back to ER with weakness/lethargy, found to have NSTEMI/elevated troponins.  LINES / TUBES: dialysis cath right femoral >>> ETT 12/13 >>> Left radial A-line 2/13 >>> L femoral TLC 2/13  CULTURES: None  ANTIBIOTICS: None  HISTORY OF PRESENT ILLNESS:  58 yo AA female with a history of CAD with CABG in 2002, grade 2 diastolic dysfunction, EF of 60-65% by echo 11/17/09, ESRD, PAD, DM2, CVA, HLD, secondary hyperparathyroidism. Dr. Clarene DukeLittle cathed her in Jan 2011 which showed patent grafts and an 80% long, tubular AV groove Circ lesion not amendable to intervention. Other hx of ESRD with HD, PVD with Rt. AKA. Last seen 12/2010 with troponin 0.74 and normal ckmb.  On 2/12 she presented to ER with weakness after placement of new AV graft and removal of old graft, some bleeding post procedure. Pt has not felt well since that time with decreased appetite, not sleeping well. Troponin in ER 9.71 and EKG with RBBB with lat t wave ischemia. Taken to cath lab on 2/13 and following cath, pt arrested. Upon further questioning of pt's husband, he states that pt has not been acting normal for the past few days and has been more confused than normal.   PAST MEDICAL HISTORY :  Past Medical History  Diagnosis Date  .  Hyperparathyroidism, secondary renal   . CAD (coronary artery disease) of artery bypass graft     CABG in 2002  . PAD (peripheral artery disease)     Not a candidate for revascularization  . Chronic constipation   . GERD (gastroesophageal reflux disease)   . Anemia of chronic renal failure   . Tardive dyskinesia     Secondary to Reglan  . Depression   . Neuropathy   . Diabetic retinopathy   . Diastolic CHF, chronic     Grade 2  . Pulmonary embolism 06/2003  . Hyperlipidemia   . Stroke 2002    "w/left side" residual weakness  . Diabetes mellitus     type II  . CHF (congestive heart failure)   . CVA (cerebral infarction)     right brain with minimal left hemiparesis  . Pneumonia 10/2009; 11/2009    ?pneumonia 01/17/11  . Hypertension   . Arthritis   . Complication of anesthesia     "sleepy afterwards; due to her being dialysis pt"  . Myocardial infarction 2002  . ESRD on dialysis     [address]; M, W, F; via right upper arm AVF (01/18/2012)   Past Surgical History  Procedure Laterality Date  . Coronary artery bypass graft  2002  . Laparoscopic cholecystectomy  2002  . Av fistula placement      times 4-using rt upper arm graft-old rt lower arm,lt upper and lower old grafts  . Trigger finger release    . Tubal ligation    .  Eye examination under anesthesia w/ retinal cryotherapy and retinal laser    . Cataract extraction  07/2010    right sided   . Toe amputation  5/12    left 2nd toe secondary to osteomyelitis  . Breast biopsy  06/2010  . Carpal tunnel release  05/05/2011    Procedure: CARPAL TUNNEL RELEASE;  Surgeon: Sharma Covert, MD;  Location: [facility];  Service: Orthopedics;  Laterality: Right;  . Coronary angioplasty with stent placement    . Amputation  11/24/2011    Procedure: AMPUTATION RAY;  Surgeon: Nadara Mustard, MD;  Location: [facility] OR;  Service: Orthopedics;  Laterality: Right;  Right Foot 2nd Ray Amputation  . Amputation  12/12/2011     Procedure: AMPUTATION BELOW KNEE;  Surgeon: Nadara Mustard, MD;  Location: [facility] OR;  Service: Orthopedics;  Laterality: Right;  . 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 Amputation  01/17/2012    Procedure: AMPUTATION ABOVE KNEE;  Surgeon: Nadara Mustard, MD;  Location: [facility] OR;  Service: Orthopedics;  Laterality: Right;  Right Above Knee Amputation  . I&d extremity  02/15/2012    Procedure: IRRIGATION AND DEBRIDEMENT EXTREMITY;  Surgeon: Kathryne Hitch, MD;  Location: [facility] OR;  Service: Orthopedics;  Laterality: Right;  . Thrombectomy and revision of arterioventous (av) goretex  graft Right 04/02/2013    Procedure: THROMBECTOMY AND REVISION OF ARTERIOVENTOUS (AV) GORETEX  GRAFT/RIGHT UPPER ARM;  Surgeon: Sherren Kerns, MD;  Location: [facility] OR;  Service: Vascular;  Laterality: Right;  . Insertion of dialysis catheter Right 04/02/2013    Procedure: INSERTION OF DIALYSIS CATHETER FEMORAL;  Surgeon: Sherren Kerns, MD;  Location: [facility] OR;  Service: Vascular;  Laterality: Right;   Prior to Admission medications   Medication Sig Start Date End Date Taking? Authorizing Provider  aspirin 81 MG tablet Take 81 mg by mouth daily.   Yes Historical Provider, MD  cinacalcet (SENSIPAR) 30 MG tablet Take 30-90 mg by mouth daily. Pt takes 30 mg in the morning and 90 mg in the morning   Yes Historical Provider, MD  doxycycline (ADOXA) 100 MG tablet Take 100 mg by mouth daily. For 30 days. Stop date 04/07/12   Yes Historical Provider, MD  fluticasone (FLONASE) 50 MCG/ACT nasal spray Place 2 sprays into the nose 2 (two) times daily.   Yes Historical Provider, MD  gabapentin (NEURONTIN) 400 MG capsule Take 400 mg by mouth 2 (two) times daily. Pt takes one in the am and one at night   Yes Historical Provider, MD  insulin glargine (LANTUS) 100 UNIT/ML injection Inject 50 Units into the skin at bedtime.   Yes Historical Provider, MD  lidocaine-prilocaine (EMLA) cream Apply 1 application topically as needed (on  dialysis port).   Yes Historical Provider, MD  multivitamin (RENA-VIT) TABS tablet Take 1 tablet by mouth daily.    Yes Historical Provider, MD  nitroGLYCERIN (NITROSTAT) 0.4 MG SL tablet Place 0.4 mg under the tongue every 5 (five) minutes as needed. For chest pain   Yes Historical Provider, MD  pantoprazole (PROTONIX) 40 MG tablet Take 40 mg by mouth daily. Take 30 min before breakfast   Yes Historical Provider, MD  polyvinyl alcohol (LIQUIFILM TEARS) 1.4 % ophthalmic solution Place 1 drop into both eyes daily as needed. For dryness   Yes Historical Provider, MD  sevelamer (RENAGEL) 800 MG tablet Take 800 mg by mouth daily. Take 3 tabs with meals and 2 tabs with  snacks   Yes Historical Provider, MD  sodium chloride (OCEAN) 0.65 % nasal spray Place 2 sprays into the nose 4 (four) times daily as needed. For nasal congestion   Yes Historical Provider, MD  tetrahydrozoline 0.05 % ophthalmic solution Place 1 drop into both eyes daily.   Yes Historical Provider, MD   Allergies  Allergen Reactions  . Lipitor [Atorvastatin Calcium] Other (See Comments)    Muscle weakness  . Codeine Nausea And Vomiting  . Metoclopramide Hcl Other (See Comments)    Tardive dyskinesia  . Iohexol Itching    Facility where pt lives does not have this listed as an allergy and she has not been pre-medicated in the past  . Oxycodone Hypertension    FAMILY HISTORY:  Family History  Problem Relation Age of Onset  . Diabetes Mother   . Cirrhosis Mother   . Diabetes Father   . Heart attack Father   . Hyperlipidemia Father   . Heart disease Father   . Diabetes Sister   . Hyperlipidemia Sister   . Hypertension Sister   . Diabetes Sister   . Diabetes Sister   . Heart disease Brother   . Hyperlipidemia Brother    SOCIAL HISTORY:  reports that she has never smoked. She has never used smokeless tobacco. She reports that she does not drink alcohol or use illicit drugs.  REVIEW OF SYSTEMS:  Unattainable, patient is  sedated and intubated.  SUBJECTIVE:   VITAL SIGNS: Temp:  [97.6 F (36.4 C)-99.2 F (37.3 C)] 98.5 F (36.9 C) (02/13 1126) Pulse Rate:  [62-81] 65 (02/13 1342) Resp:  [12-24] 21 (02/13 1200) BP: (78-116)/(45-66) 101/49 mmHg (02/13 1200) SpO2:  [92 %-100 %] 100 % (02/13 1200) Weight:  [169 lb 12.1 oz (77 kg)-173 lb 1 oz (78.5 kg)] 173 lb 1 oz (78.5 kg) (02/13 0500) HEMODYNAMICS:   VENTILATOR SETTINGS:   INTAKE / OUTPUT: Intake/Output     02/12 0701 - 02/13 0700 02/13 0701 - 02/14 0700   I.V. (mL/kg) 228 (2.9) 3 (0)   Total Intake(mL/kg) 228 (2.9) 3 (0)   Total Output 0     Net +228 +3          PHYSICAL EXAMINATION: General:  Chronically ill appearing female, lying in bed, on vent. Neuro:  Somewhat arousable but not following commands. HEENT:  [place]/AT. Cardiovascular:  Tachycardic but regular, no M/R/G. Lungs:  CTA bilaterally, no W/R/R. Abdomen:  BS x 4, soft, NT/ND. Musculoskeletal:  Right BKA otherwise no gross deformities. No edema. Skin:  Intact.  LABS:  CBC  Recent Labs Lab 04/10/2013 1222 04/07/2013 1816 04/17/2013 0439  WBC 10.2  --  10.0  HGB 11.4* 12.6 10.5*  HCT 35.4* 37.0 32.5*  PLT 151  --  135*   Coag's  Recent Labs Lab 04/18/2013 1413 04/18/2013 0439 04/15/2013 0825  APTT 175*  --   --   INR 2.29* 6.27* 1.84*   BMET  Recent Labs Lab 04/03/2013 1447 03/27/2013 1816 04/07/2013 0439  NA 134* 135* 138  140  K 6.3* 5.1 3.2*  3.2*  CL 90* 98 95*  96  CO2 17*  --  26  24  BUN 49* 50* 18  18  CREATININE 7.81* 8.50* 4.06*  3.98*  GLUCOSE 364* 290* 59*  60*   Electrolytes  Recent Labs Lab 04/07/2013 1447 04/06/2013 0439  CALCIUM 8.2* 8.2*  8.0*  PHOS  --  3.0   Liver Enzymes  Recent Labs Lab 04/01/2013 0439  ALBUMIN  3.6   Cardiac Enzymes  Recent Labs Lab 04-27-13 2245 04/14/2013 0825 04/15/2013 1245  TROPONINI 14.40* 10.26* 8.78*  PROBNP 44743.0*  --   --    Glucose  Recent Labs Lab 04/04/2013 0401 03/27/2013 0822 04/12/2013 0903  04/18/2013 1101 04/19/2013 1225 04/14/2013 1429  GLUCAP 76 69* 72 76 133* 144*    Imaging Dg Chest 2 View  2013-04-27   CLINICAL DATA:  Chest pain, shortness of breath.  EXAM: CHEST  2 VIEW  COMPARISON:  April 02, 2013.  FINDINGS: Stable cardiomegaly. Status post coronary artery bypass graft. Distal tips of dialysis catheter extending from inferior vena cava are unchanged in position within the right atrium. Stable central pulmonary vascular congestion is noted. Bilateral pulmonary edema noted on prior exam does appear to be significantly improved. No pneumothorax or significant pleural effusion is noted.  IMPRESSION: Bilateral pulmonary edema noted on prior exam does appear to be significantly improved, although mild central pulmonary vascular congestion remains.   Electronically Signed   By: Roque Lias M.D.   On: 2013-04-27 12:13    ASSESSMENT / PLAN:  PULMONARY A: Acute Respiratory Failure P:   - Full vent support. - ABG in 30 minutes and adjust vent accordingly. - ABG and CXR in AM.  CARDIOVASCULAR A:  S/p Cardiac Arrest. Elevated Troponins CAD P:  - No role for cooling as down time less than 5 minutes and pt interactive/combative post CPR. - Continue Levo for goal MAP > 65. - Manage per cards. - Place femoral TLC (IJ and [place] are thrombosed per previous records) so no CVP.  RENAL A: ESRD Metabolic Acidosis P:   - Probably CVVH, defer to nephrology - have been consulted. - Gentle hydration with NS.  GASTROINTESTINAL A:  No acute issues. P:   - SUP:  Pantoprazole. - NPO, if intubated in AM then will start TF.  HEMATOLOGIC A:  No acute issues. P:  - Monitor CBC.  INFECTIOUS A:   No acute issues. P:   - Monitor fever curve/WBC's.  ENDOCRINE A:  DM II P:   - SSI and Lantus.  NEUROLOGIC A:  Acute encephalopathy - in setting of cardiac arrest and sedation. Tardive Dyskinesia P:   - Versed/Fentanyl for sedation. - Head CT to r/o intracranial bleed  secondary to elevated INR.   Rutherford Guys, PA - [facility] [place] Pgr: [phone]  or [phone]  Profoundly hypotensive, levophed increased, dobutamine ordered, responds very nicely to bicarb.  Will start drip.  Needs CVVH.  Attempted TLC at the left femoral multiple times without success, IJs are essentially not useable.  Will consult IR for access as we will be unable to CVVH and keep on pressors at the sametime.  I have personally obtained a history, examined the patient, evaluated laboratory and imaging results, formulated the assessment and plan and placed orders.  CRITICAL CARE: The patient is critically ill with multiple organ systems failure and requires high complexity decision making for assessment and support, frequent evaluation and titration of therapies, application of advanced monitoring technologies and extensive interpretation of multiple databases. Critical Care Time devoted to patient care services described in this note is 90 minutes.   Alyson Reedy, M.D. [facility] Pulmonary/Critical Care Medicine. Pager: [phone]. After hours pager: [phone].

## 2013-04-06 ENCOUNTER — Inpatient Hospital Stay (HOSPITAL_COMMUNITY): Payer: Medicare (Managed Care)

## 2013-04-06 DIAGNOSIS — E119 Type 2 diabetes mellitus without complications: Secondary | ICD-10-CM

## 2013-04-06 DIAGNOSIS — R4182 Altered mental status, unspecified: Secondary | ICD-10-CM

## 2013-04-06 DIAGNOSIS — I379 Nonrheumatic pulmonary valve disorder, unspecified: Secondary | ICD-10-CM

## 2013-04-06 LAB — POCT I-STAT 3, ART BLOOD GAS (G3+)
Acid-Base Excess: 1 mmol/L (ref 0.0–2.0)
Bicarbonate: 24.1 mEq/L — ABNORMAL HIGH (ref 20.0–24.0)
O2 SAT: 99 %
PCO2 ART: 32.8 mmHg — AB (ref 35.0–45.0)
PO2 ART: 143 mmHg — AB (ref 80.0–100.0)
Patient temperature: 97.8
TCO2: 25 mmol/L (ref 0–100)
pH, Arterial: 7.473 — ABNORMAL HIGH (ref 7.350–7.450)

## 2013-04-06 LAB — RENAL FUNCTION PANEL
ALBUMIN: 3.5 g/dL (ref 3.5–5.2)
Albumin: 3.4 g/dL — ABNORMAL LOW (ref 3.5–5.2)
BUN: 25 mg/dL — ABNORMAL HIGH (ref 6–23)
BUN: 21 mg/dL (ref 6–23)
CALCIUM: 7.6 mg/dL — AB (ref 8.4–10.5)
CHLORIDE: 91 meq/L — AB (ref 96–112)
CO2: 20 meq/L (ref 19–32)
CO2: 27 meq/L (ref 19–32)
CREATININE: 3.5 mg/dL — AB (ref 0.50–1.10)
CREATININE: 2.67 mg/dL — AB (ref 0.50–1.10)
Calcium: 7.4 mg/dL — ABNORMAL LOW (ref 8.4–10.5)
Chloride: 88 mEq/L — ABNORMAL LOW (ref 96–112)
GFR calc Af Amer: 21 mL/min — ABNORMAL LOW (ref >90)
GFR calc non Af Amer: 18 mL/min — ABNORMAL LOW (ref >90)
GFR, EST AFRICAN AMERICAN: 15 mL/min — AB (ref >90)
GFR, EST NON AFRICAN AMERICAN: 13 mL/min — AB (ref >90)
Glucose, Bld: 246 mg/dL — ABNORMAL HIGH (ref 70–99)
Glucose, Bld: 165 mg/dL — ABNORMAL HIGH (ref 70–99)
Phosphorus: 3.3 mg/dL (ref 2.3–4.6)
Phosphorus: 1.8 mg/dL — ABNORMAL LOW (ref 2.3–4.6)
Potassium: 4.1 mEq/L (ref 3.7–5.3)
Potassium: 3.6 mEq/L — ABNORMAL LOW (ref 3.7–5.3)
SODIUM: 139 meq/L (ref 137–147)
Sodium: 138 mEq/L (ref 137–147)

## 2013-04-06 LAB — GLUCOSE, CAPILLARY
GLUCOSE-CAPILLARY: 107 mg/dL — AB (ref 70–99)
GLUCOSE-CAPILLARY: 135 mg/dL — AB (ref 70–99)
GLUCOSE-CAPILLARY: 149 mg/dL — AB (ref 70–99)
GLUCOSE-CAPILLARY: 250 mg/dL — AB (ref 70–99)
Glucose-Capillary: 177 mg/dL — ABNORMAL HIGH (ref 70–99)
Glucose-Capillary: 161 mg/dL — ABNORMAL HIGH (ref 70–99)

## 2013-04-06 LAB — MAGNESIUM: MAGNESIUM: 2.2 mg/dL (ref 1.5–2.5)

## 2013-04-06 LAB — HEPARIN LEVEL (UNFRACTIONATED)
HEPARIN UNFRACTIONATED: 0.3 [IU]/mL (ref 0.30–0.70)
Heparin Unfractionated: 0.11 IU/mL — ABNORMAL LOW (ref 0.30–0.70)

## 2013-04-06 LAB — CBC
HCT: 34.1 % — ABNORMAL LOW (ref 36.0–46.0)
Hemoglobin: 11.1 g/dL — ABNORMAL LOW (ref 12.0–15.0)
MCH: 27.5 pg (ref 26.0–34.0)
MCHC: 32.6 g/dL (ref 30.0–36.0)
MCV: 84.4 fL (ref 78.0–100.0)
Platelets: DECREASED 10*3/uL (ref 150–400)
RBC: 4.04 MIL/uL (ref 3.87–5.11)
RDW: 16.6 % — ABNORMAL HIGH (ref 11.5–15.5)
WBC: 14.8 10*3/uL — AB (ref 4.0–10.5)

## 2013-04-06 LAB — APTT: APTT: 90 s — AB (ref 24–37)

## 2013-04-06 LAB — TSH: TSH: 6.612 u[IU]/mL — AB (ref 0.350–4.500)

## 2013-04-06 LAB — PROCALCITONIN: Procalcitonin: 3.18 ng/mL

## 2013-04-06 MED ORDER — PRISMASOL BGK 4/2.5 32-4-2.5 MEQ/L IV SOLN
INTRAVENOUS | Status: DC
  Administered 2013-04-06 (×2): via INTRAVENOUS_CENTRAL
  Filled 2013-04-06 (×10): qty 5000

## 2013-04-06 MED ORDER — POTASSIUM CHLORIDE 10 MEQ/50ML IV SOLN
10.0000 meq | INTRAVENOUS | Status: AC
  Administered 2013-04-06 (×3): 10 meq via INTRAVENOUS
  Filled 2013-04-06 (×2): qty 50

## 2013-04-06 MED ORDER — NEPRO/CARBSTEADY PO LIQD: 1000.0000 mL | ORAL | Status: DC

## 2013-04-06 MED ORDER — SODIUM CHLORIDE 0.9 % IV SOLN
INTRAVENOUS | Status: DC
  Administered 2013-04-06: 01:00:00 via INTRAVENOUS
  Administered 2013-04-07: 250 mL via INTRAVENOUS
  Administered 2013-04-08 – 2013-04-10 (×3): via INTRAVENOUS

## 2013-04-06 MED ORDER — HYDROCORTISONE NA SUCCINATE PF 100 MG IJ SOLR
50.0000 mg | Freq: Four times a day (QID) | INTRAMUSCULAR | Status: DC
  Administered 2013-04-06 – 2013-04-07 (×4): 50 mg via INTRAVENOUS
  Filled 2013-04-06 (×8): qty 1

## 2013-04-06 MED ORDER — POTASSIUM CHLORIDE 10 MEQ/50ML IV SOLN
INTRAVENOUS | Status: AC
  Filled 2013-04-06: qty 50

## 2013-04-06 MED ORDER — PANTOPRAZOLE SODIUM 40 MG PO PACK
40.0000 mg | PACK | Freq: Every day | ORAL | Status: DC
  Administered 2013-04-06 – 2013-04-10 (×5): 40 mg
  Filled 2013-04-06 (×6): qty 20

## 2013-04-06 MED ORDER — INSULIN ASPART 100 UNIT/ML ~~LOC~~ SOLN
0.0000 [IU] | SUBCUTANEOUS | Status: DC
  Administered 2013-04-06: 3 [IU] via SUBCUTANEOUS
  Administered 2013-04-06 – 2013-04-07 (×3): 2 [IU] via SUBCUTANEOUS
  Administered 2013-04-07 – 2013-04-08 (×5): 3 [IU] via SUBCUTANEOUS
  Administered 2013-04-08: 2 [IU] via SUBCUTANEOUS
  Administered 2013-04-08 (×2): 3 [IU] via SUBCUTANEOUS
  Administered 2013-04-09: 5 [IU] via SUBCUTANEOUS
  Administered 2013-04-09: 3 [IU] via SUBCUTANEOUS
  Administered 2013-04-09: 2 [IU] via SUBCUTANEOUS
  Administered 2013-04-09: 3 [IU] via SUBCUTANEOUS
  Administered 2013-04-09: 2 [IU] via SUBCUTANEOUS
  Administered 2013-04-10 (×4): 3 [IU] via SUBCUTANEOUS
  Administered 2013-04-10: 2 [IU] via SUBCUTANEOUS
  Administered 2013-04-10 – 2013-04-11 (×2): 3 [IU] via SUBCUTANEOUS
  Administered 2013-04-11: 5 [IU] via SUBCUTANEOUS
  Administered 2013-04-11: 3 [IU] via SUBCUTANEOUS

## 2013-04-06 MED ORDER — NEPRO/CARBSTEADY PO LIQD
1000.0000 mL | ORAL | Status: DC
  Administered 2013-04-06: 1000 mL
  Filled 2013-04-06 (×3): qty 1000

## 2013-04-06 MED ORDER — ASPIRIN 81 MG PO CHEW
81.0000 mg | CHEWABLE_TABLET | Freq: Every day | ORAL | Status: DC
  Administered 2013-04-06: 81 mg via ORAL
  Filled 2013-04-06 (×2): qty 1

## 2013-04-06 NOTE — Progress Notes (Signed)
Name: Faith [name] MRN: [id number] DOB: [birth year]    ADMISSION DATE:  04/15/2013 CONSULTATION DATE:  04/19/2013  REFERRING MD :  Allyson SabalBerry  CHIEF COMPLAINT:  Cardiac Arrest  BRIEF PATIENT DESCRIPTION:  58 y.o. F admitted on 2/9 for profuse bleeding from right arm graft site.  Graft was clotted, failed thrombolectomy.  Discharged from ER and sent home, presented again 2/12 for weakness.  Workup in ED with elevated troponins/NSTEMI.  Pt taken to cath lab 2/13 and suffered cardiac arrest post cath.  PCCM consulted for further management.  SIGNIFICANT EVENTS / STUDIES:  2/9 - profuse bleeding from right arm graft site. Failed thrombolectomy. 2/12 - Back to ER with weakness/lethargy, found to have NSTEMI/elevated troponins.  LINES / TUBES: dialysis cath right femoral >>  ETT 12/13 >>> Left radial A-line 2/13 >>> L femoral TLC 2/13  CULTURES:   ANTIBIOTICS:    SUBJECTIVE:  Minimally responsive  VITAL SIGNS: Temp:  [94.8 F (34.9 C)-98 F (36.7 C)] 95.6 F (35.3 C) (02/14 1230) Pulse Rate:  [51-128] 51 (02/14 1430) Resp:  [12-29] 18 (02/14 1430) BP: (48-215)/(13-152) 153/93 mmHg (02/14 1430) SpO2:  [86 %-100 %] 100 % (02/14 1430) Arterial Line BP: (61-205)/(34-96) 132/50 mmHg (02/14 1430) FiO2 (%):  [30 %-100 %] 30 % (02/14 1130) Weight:  [78.5 kg (173 lb 1 oz)-80 kg (176 lb 5.9 oz)] 80 kg (176 lb 5.9 oz) (02/14 0500) HEMODYNAMICS: CVP:  [24 mmHg-42 mmHg] 24 mmHg VENTILATOR SETTINGS: Vent Mode:  [-] PRVC FiO2 (%):  [30 %-100 %] 30 % Set Rate:  [16 bmp-28 bmp] 18 bmp Vt Set:  [440 mL] 440 mL PEEP:  [5 cmH20] 5 cmH20 Plateau Pressure:  [23 cmH20-30 cmH20] 26 cmH20 INTAKE / OUTPUT: Intake/Output     02/13 0701 - 02/14 0700 02/14 0701 - 02/15 0700   I.V. (mL/kg) 2737.7 (34.2) 704.1 (8.8)   NG/GT 30 60   IV Piggyback 100    Total Intake(mL/kg) 2867.7 (35.8) 764.1 (9.6)   Other 1149 827   Total Output 1149 827   Net +1718.7 -62.9          PHYSICAL  EXAMINATION: General:  Chronically ill, minimally responsive Neuro:  Not F/C HEENT: WNL Cardiovascular: Reg, no M Lungs:  CTA bilaterally Abdomen:  BS x 4, soft, NT/ND. Musculoskeletal:  Right BKA. No edema.   LABS: I have reviewed all of today's lab results. Relevant abnormalities are discussed in the A/P section  CXR: LLL atx vs inf  ASSESSMENT / PLAN:  PULMONARY A: Acute Respiratory Failure P:   Cont full vent support  CARDIOVASCULAR A:  S/p Cardiac Arrest. Elevated Troponins CAD P:  Further eval and mgmt per Cards Change MAP goal to 60 mmHg  RENAL A: ESRD Metabolic Acidosis P:   CRRT per Renal  GASTROINTESTINAL A:  No acute issues. P:   SUP:  Pantoprazole. Begin TFs   HEMATOLOGIC A:  No acute issues. P:  - Monitor CBC.  INFECTIOUS A:   No acute issues. P:   Micro and abx as above  ENDOCRINE A:  DM II P:   Cont lantus and SSI Change to mod scale NEUROLOGIC A:  Acute encephalopathy - in setting of cardiac arrest and sedation. Chronic cognitive impairment, NOS Tardive Dyskinesia P:   Cont current sedation    Extended conversation with pt's husband who acknowledges that pt has been in failing health for many years with declining QOL. I introduced the concept of advanced directives and limiting care to  those interventions that might benefit her overall health (of which there are few). He understands and agrees that she should not undergo further ACLS under any circumstances. We will continue oouor aggressive efforts through WE and no longer than into first of next week. Once extubated, he agrees that she should not undergo repeat intubation.  I have personally obtained a history, examined the patient, evaluated laboratory and imaging results, formulated the assessment and plan and placed orders.  CRITICAL CARE: The patient is critically ill with multiple organ systems failure and requires high complexity decision making for assessment and  support, frequent evaluation and titration of therapies, application of advanced monitoring technologies and extensive interpretation of multiple databases. Critical Care Time devoted to patient care services described in this note is 45 minutes.   Billy Fischer, MD ; [facility] [phone].  After 5:30 PM or weekends, call [phone]

## 2013-04-06 NOTE — Progress Notes (Signed)
Order received for urine culture with reflex by in/out cath. Peri care completed prior to cath. In/out cath procedure completed utilizing sterile technique with assistance of 2 RNs. Meatus and vaginal openings identified. Patient has been anuric while dialyzed as out and inpatient. No urine return noted.

## 2013-04-06 NOTE — Progress Notes (Signed)
[facility] Progress Note   Subjective: On vent, opens eyes to voice, tracks, not responding to commands.  On two pressors. Per RN who was working yesterday the cardiac arrest yesterday occurred post-cath and included PEA, vent arrythmia, possibly other rhythm, CPR for about 5 min total.   Exam  Blood pressure 90/48, pulse 56, temperature 94.8 F (34.9 C), temperature source Axillary, resp. rate 28, height 5\' 4"  (1.626 m), weight 80 kg (176 lb 5.9 oz), SpO2 93.00%. On vent, opens eyes to voice Clear bilat RRR no rub or gallop Abd soft, nt, nd, no ascites Ext no UE or LE edema Access R upper arm examined, dressings removed > diffuse edema, minimal erythema, no drainage, exam c/w post-op changes, +strong bruit over AVG R thigh TDC w clean exit site Neuro opens eyes, moves arms > legs  Dialysis: MWF [facility] 3.5h   76kg   2K/2.0[place]ath  Heparin 5000   R fem TDC (new R upper arm AVG 2/10)  Calcitriol po 1ug tiw    Epo none     Venofer none Last labs: Hb 11.6 tsat 21% ferritin 1621 phos 2.8  pth 231  CXR L atx, o/w clear  Assessment/Plan: 1 NSTEMI / hx CABG- patent grafts by cath yesterday 2 Cardiac arrest- post cath, CPR ~5 min 3 Hypotension- unclear cause, no source of sepsis noted, no sig fever; blood cx's ordered and on empiric IV vanc; check urine; heart cath negative; on pressors and IV steroids per CCM; R arm access and HD cath do not look infected 4 HTN/volume- 4kg up, no pulm edema, pull vol when off pressors 5 Anemia Hb 11, no esa for now 6 2HPTH- hold binders, cont sensipar + po vit D 7 S/P removal degenerated RUA AVG and placement new AVG at same site 2/10 (Dr Darrick Penna)  8 ESRD- on CRRT due to hypotension  Plan- cont CRRT, emp abx, cath UA, wean pressors as Caryl Pina MD  pager [phone]    cell [phone]  04/06/2013, 10:13 AM     Recent Labs Lab April 16, 2013 0439  2013/04/16 1600 April 16, 2013 1900 April 16, 2013 1937 04/06/13 0400  NA 138  140  < > 135* 141 140  139  K 3.2*  3.2*  < > 4.4 4.6 4.7 4.1  CL 95*  96  < > 94* 93* 100 88*  CO2 26  24  --  11* 20  --  20  GLUCOSE 59*  60*  < > 210* 176* 170* 246*  BUN 18  18  < > 23 25* 24* 25*  CREATININE 4.06*  3.98*  < > 4.53* 4.89* 4.60* 3.50*  CALCIUM 8.2*  8.0*  --  8.8 8.0*  --  7.4*  PHOS 3.0  --   --   --   --  3.3  < > = values in this interval not displayed.  Recent Labs Lab Apr 16, 2013 0439 April 16, 2013 1600 04/06/13 0400  AST  --  295*  --   ALT  --  41*  --   ALKPHOS  --  231*  --   BILITOT  --  0.6  --   PROT  --  7.6  --   ALBUMIN 3.6 3.4* 3.5    Recent Labs Lab 03/28/2013 1222  04-16-13 1600 April 16, 2013 1900 April 16, 2013 1937 04/06/13 0400  WBC 10.2  < > 12.6* 14.1*  --  14.8*  NEUTROABS 7.4  --   --   --   --   --  HGB 11.4*  < > 11.4* 10.0* 12.2 11.1*  HCT 35.4*  < > 36.7 32.3* 36.0 34.1*  MCV 85.3  < > 87.8 87.5  --  84.4  PLT 151  < > 111* 124*  --  PLATELET CLUMPS NOTED ON SMEAR, COUNT APPEARS DECREASED  < > = values in this interval not displayed. Marland Kitchen. antiseptic oral rinse  15 mL Mouth Rinse QID  . aspirin EC  81 mg Oral Daily  . calcitRIOL  1 mcg Oral Once per day on Mon Wed Fri  . chlorhexidine  15 mL Mouth Rinse BID  . hydrocortisone sod succinate (SOLU-CORTEF) inj  100 mg Intravenous Q8H  . insulin aspart  0-9 Units Subcutaneous 6 times per day  . insulin glargine  20 Units Subcutaneous QHS  . multivitamin  1 tablet Oral QHS  . pantoprazole  40 mg Oral Daily  . piperacillin-tazobactam (ZOSYN)  IV  2.25 g Intravenous 4 times per day  . sevelamer carbonate  800 mg Oral TID WC   . sodium chloride 20 mL/hr at 03/28/2013 2206  . sodium chloride 10 mL/hr at 04/06/13 0053  . fentaNYL infusion INTRAVENOUS 100 mcg/hr (04/06/13 0955)  . heparin 900 Units/hr (04/06/13 0647)  . midazolam (VERSED) infusion 1 mg/hr (04/06/13 1010)  . norepinephrine (LEVOPHED) Adult infusion 4 mcg/min (04/06/13 0620)  . dialysis replacement fluid (prismasate) 200 mL/hr at 04/03/2013 2206  .  dialysis replacement fluid (prismasate)    . dialysate (PRISMASATE) 1,500 mL/hr at 04/06/13 0853  . vasopressin (PITRESSIN) infusion - *FOR SHOCK* 0.03 Units/min (04/06/13 0410)   heparin, ondansetron (ZOFRAN) IV, polyvinyl alcohol, sodium chloride

## 2013-04-06 NOTE — Progress Notes (Signed)
ANTICOAGULATION CONSULT NOTE - Follow Up Consult  Pharmacy Consult for heparin Indication: NSTEMI  Labs:  Recent Labs  04/12/2013 1413  04/20/2013 1446  03/26/2013 2245  03/26/2013 0439 04/09/2013 0825  04/09/2013 1245  04/07/2013 1600 04/03/2013 1900 04/06/2013 1937 04/06/13 0400 04/06/13 1500  HGB  --   --   --   < >  --   --  10.5*  --   --   --   < > 11.4* 10.0* 12.2 11.1*  --   HCT  --   --   --   < >  --   --  32.5*  --   --   --   < > 36.7 32.3* 36.0 34.1*  --   PLT  --   --   --   --   --   --  135*  --   --   --   --  111* 124*  --  PLATELET CLUMPS NOTED ON SMEAR, COUNT APPEARS DECREASED  --   APTT 175*  --   --   --   --   --   --   --   --   --   --   --   --   --  90*  --   LABPROT 24.5*  --   --   --   --   --  52.8* 20.7*  --   --   --  21.1*  --   --   --   --   INR 2.29*  --   --   --   --   --  6.27* 1.84*  --   --   --  1.89*  --   --   --   --   HEPARINUNFRC  --   --   --   --   --   < > >2.20*  --   < >  --   --  <0.10*  --   --  0.11* 0.30  CREATININE  --   --   --   < >  --   --  4.06*  3.98*  --   --   --   < > 4.53* 4.89* 4.60* 3.50*  --   CKTOTAL  --   --  350*  --   --   --   --   --   --   --   --   --   --   --   --   --   CKMB  --   --  20.3*  --   --   --   --   --   --   --   --   --   --   --   --   --   TROPONINI  --   < > 7.91*  --  14.40*  --   --  10.26*  --  8.78*  --   --   --   --   --   --   < > = values in this interval not displayed.   Assessment: 58yo female on heparin after resumed post-cath. HL now therapeutic at 0.3. H/H slightly decreased. Per nurse, no interruptions in gtt and no noted s/s bleeding. Will increase rate slightly to maintain in therapeutic range.   Goal of Therapy:  Heparin level 0.3-0.7 units/ml   Plan:  - Will increase heparin gtt by 1 unit/kg/hr to 1000  units/hr  - Daily HL, CBC - Monitor for s/s bleeding  Margie Billet, PharmD Clinical Pharmacist - Resident Pager: [phone] Pharmacy: [phone] 04/06/2013 4:12  PM

## 2013-04-06 NOTE — Plan of Care (Signed)
Problem: Phase I Progression Outcomes Goal: Pain controlled with appropriate interventions Outcome: Not Progressing Patient coded in cath lab holding area on 04/02/2013. Intubated and sedated at present time. Goal: Hemodynamically stable Outcome: Not Progressing Patient placed on multiple vasoactive medications to improve blood pressure.  Goal: Distal pulses equal to baseline Outcome: Progressing patient has bks of rt leg. Pulse weak and faint upon palapation.

## 2013-04-06 NOTE — Progress Notes (Signed)
EEG Completed; Results Pending  

## 2013-04-06 NOTE — Progress Notes (Signed)
Subjective: Patient remains sedated and intubated.  On continuous dialysis.  Imaging pending.  With sedation holiday patient did respond to name.    Objective: Current vital signs: BP 162/138  Pulse 54  Temp(Src) 95.6 F (35.3 C) (Oral)  Resp 18  Ht 5\' 4"  (1.626 m)  Wt 80 kg (176 lb 5.9 oz)  BMI 30.26 kg/m2  SpO2 100% Vital signs in last 24 hours: Temp:  [94.8 F (34.9 C)-98 F (36.7 C)] 95.6 F (35.3 C) (02/14 1230) Pulse Rate:  [51-128] 54 (02/14 1500) Resp:  [12-29] 18 (02/14 1500) BP: (48-215)/(13-152) 162/138 mmHg (02/14 1500) SpO2:  [86 %-100 %] 100 % (02/14 1500) Arterial Line BP: (61-205)/(34-96) 120/48 mmHg (02/14 1500) FiO2 (%):  [30 %-100 %] 30 % (02/14 1130) Weight:  [78.5 kg (173 lb 1 oz)-80 kg (176 lb 5.9 oz)] 80 kg (176 lb 5.9 oz) (02/14 0500)  Intake/Output from previous day: 02/13 0701 - 02/14 0700 In: 2867.7 [I.V.:2737.7; NG/GT:30; IV Piggyback:100] Out: 1149  Intake/Output this shift: Total I/O In: 764.1 [I.V.:704.1; NG/GT:60] Out: 827 [Other:827] Nutritional status:    Neurologic Exam: Mental Status: Patient does not respond to verbal stimuli.  Does not respond to deep sternal rub.  Does not follow commands.  No verbalizations noted.  Cranial Nerves: II: patient does not respond confrontation bilaterally, pupils right 4 mm, left 4 mm,and fixed bilaterally III,IV,VI: doll's response absent bilaterally.  V,VII: corneal reflex absent bilaterally  VIII: patient does not respond to verbal stimuli IX,X: gag reflex reduced, XI: trapezius strength unable to test bilaterally XII: tongue strength unable to test Motor: Extremities flaccid throughout.  No spontaneous movement noted.  No purposeful movements noted. Sensory: Does not respond to noxious stimuli in any extremity. Deep Tendon Reflexes:  Absent throughout. Plantars: mute on the left. 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    Lab Results: Basic Metabolic Panel:  Recent Labs Lab  04/17/2013 1447  03/25/2013 0439 04/15/2013 1553 03/30/2013 1600 04/16/2013 1900 03/28/2013 1937 04/06/13 0400  NA 134*  < > 138  140 136* 135* 141 140 139  K 6.3*  < > 3.2*  3.2* 4.0 4.4 4.6 4.7 4.1  CL 90*  < > 95*  96 104 94* 93* 100 88*  CO2 17*  --  26  24  --  11* 20  --  20  GLUCOSE 364*  < > 59*  60* 168* 210* 176* 170* 246*  BUN 49*  < > 18  18 22 23  25* 24* 25*  CREATININE 7.81*  < > 4.06*  3.98* 4.60* 4.53* 4.89* 4.60* 3.50*  CALCIUM 8.2*  --  8.2*  8.0*  --  8.8 8.0*  --  7.4*  MG  --   --   --   --   --   --   --  2.2  PHOS  --   --  3.0  --   --   --   --  3.3  < > = values in this interval not displayed.  Liver Function Tests:  Recent Labs Lab 04/18/2013 0439 04/10/2013 1600 04/06/13 0400  AST  --  295*  --   ALT  --  41*  --   ALKPHOS  --  231*  --   BILITOT  --  0.6  --   PROT  --  7.6  --   ALBUMIN 3.6 3.4* 3.5   No results found for this basename: LIPASE, AMYLASE,  in the last 168 hours No results  found for this basename: AMMONIA,  in the last 168 hours  CBC:  Recent Labs Lab 04/12/2013 1222  March 16, 2013 0439 March 16, 2013 1553 March 16, 2013 1600 March 16, 2013 1900 March 16, 2013 1937 04/06/13 0400  WBC 10.2  --  10.0  --  12.6* 14.1*  --  14.8*  NEUTROABS 7.4  --   --   --   --   --   --   --   HGB 11.4*  < > 10.5* 13.6 11.4* 10.0* 12.2 11.1*  HCT 35.4*  < > 32.5* 40.0 36.7 32.3* 36.0 34.1*  MCV 85.3  --  84.6  --  87.8 87.5  --  84.4  PLT 151  --  135*  --  111* 124*  --  PLATELET CLUMPS NOTED ON SMEAR, COUNT APPEARS DECREASED  < > = values in this interval not displayed.  Cardiac Enzymes:  Recent Labs Lab 04/10/2013 1446 03/27/2013 2245 March 16, 2013 0825 March 16, 2013 1245  CKTOTAL 350*  --   --   --   CKMB 20.3*  --   --   --   TROPONINI 7.91* 14.40* 10.26* 8.78*    Lipid Panel:  Recent Labs Lab March 16, 2013 0439  CHOL 118  TRIG 108  HDL 42  CHOLHDL 2.8  VLDL 22  LDLCALC 54    CBG:  Recent Labs Lab March 16, 2013 1101 March 16, 2013 1225 March 16, 2013 1429 March 16, 2013 1926  04/06/13 0758  GLUCAP 76 133* 144* 169* 250*    Microbiology: Results for orders placed during the hospital encounter of 04/02/2013  MRSA PCR SCREENING     Status: None   Collection Time    04/06/2013  7:00 PM      Result Value Ref Range Status   MRSA by PCR NEGATIVE  NEGATIVE Final   Comment:            The GeneXpert MRSA Assay (FDA     approved for NASAL specimens     only), is one component of a     comprehensive MRSA colonization     surveillance program. It is not     intended to diagnose MRSA     infection nor to guide or     monitor treatment for     MRSA infections.    Coagulation Studies:  Recent Labs  03/25/2013 1413 March 16, 2013 0439 March 16, 2013 0825 March 16, 2013 1600  LABPROT 24.5* 52.8* 20.7* 21.1*  INR 2.29* 6.27* 1.84* 1.89*    Imaging: Dg Chest Port 1 View  04/06/2013   CLINICAL DATA:  Cardiac arrest.  EXAM: PORTABLE CHEST - 1 VIEW  COMPARISON:  DG CHEST 1V PORT dated 09-09-13; DG CHEST 2 VIEW dated 04/16/2013  FINDINGS: Endotracheal tube present with the tip 1.5 cm above the carina. Gastric decompression tube extends into the stomach. Femoral dialysis catheter extends into the right atrium. Lungs show left lower lobe atelectasis. No overt pulmonary edema, pleural fluid and a or pneumothorax is identified. The heart size is stable.  IMPRESSION: Left lower lobe atelectasis.   Electronically Signed   By: Irish LackGlenn  Yamagata M.D.   On: 04/06/2013 09:22   Dg Chest Port 1 View  09-09-13   CLINICAL DATA:  Endotracheal tube placement.  EXAM: PORTABLE CHEST - 1 VIEW  COMPARISON:  Single view of the chest 04/05/1999.  FINDINGS: Endotracheal tube is in place with the tip 1.2 cm above the carina. The tube could be withdrawn 1-2 cm for better positioning. IVC approach central venous catheter has been withdrawn with tip now in the right atrium. There is cardiomegaly  and interstitial edema. No pneumothorax.  IMPRESSION: ET tube tip is 1.2 cm above the carina. The tube could be withdrawn 1-  the 2 cm for better positioning.  IVC approach central venous catheter tip is now within the right atrium.  Cardiomegaly and interstitial edema.   Electronically Signed   By: Drusilla Kanner M.D.   On: 04/28/2013 16:34    Medications:  I have reviewed the patient's current medications. Scheduled: . antiseptic oral rinse  15 mL Mouth Rinse QID  . aspirin  81 mg Oral Daily  . calcitRIOL  1 mcg Oral Once per day on Mon Wed Fri  . chlorhexidine  15 mL Mouth Rinse BID  . hydrocortisone sod succinate (SOLU-CORTEF) inj  50 mg Intravenous Q6H  . insulin aspart  0-15 Units Subcutaneous 6 times per day  . insulin glargine  20 Units Subcutaneous QHS  . pantoprazole sodium  40 mg Per Tube Q1200    Assessment/Plan: 59 year old female s/p arrest with altered mental status.  Had an episode prior to presenting to the hospital when she was unresponsive as well.  Unclear if this was cardiac mediated.  Further work up for neurologic etiology pending.    Recommendations: 1.  Portable CT 2.  EEG results pending   LOS: 2 days   Thana Farr, MD [facility] [phone] 04/06/2013  3:08 PM

## 2013-04-06 NOTE — Procedures (Signed)
ELECTROENCEPHALOGRAM REPORT   Patient: Faith [name]       Room #: [id number] EEG No. ID: [id number] Age: 58 y.o.        Sex: female Referring Physician: Allyson SabalBerry Report Date:  04/06/2013        Interpreting Physician: Thana FarrEYNOLDS,Loudon Krakow D  History: Faith [name] is an 58 y.o. female with altered mental status  Medications:  Scheduled: . antiseptic oral rinse  15 mL Mouth Rinse QID  . aspirin  81 mg Oral Daily  . calcitRIOL  1 mcg Oral Once per day on Mon Wed Fri  . chlorhexidine  15 mL Mouth Rinse BID  . feeding supplement (NEPRO CARB STEADY)  1,000 mL Per Tube Q24H  . hydrocortisone sod succinate (SOLU-CORTEF) inj  50 mg Intravenous Q6H  . insulin aspart  0-15 Units Subcutaneous 6 times per day  . insulin glargine  20 Units Subcutaneous QHS  . pantoprazole sodium  40 mg Per Tube Q1200    Conditions of Recording:  This is a 16 channel EEG carried out with the patient in the intubated and sedated state.  Description:  Artifact dominates the background activity.  Due to the predominance of artifact the majority of the record can not be evaluated.  At times some activity over some quadrants can be seen and the background is slow consisting of a mixture of theta and delta activity.  Hyperventilation and intermittent photic stimulation were not performed.  IMPRESSION: This is a technically difficult record secondary to the predominance of muscle and movement artifact.  On only rare occasions can a background activity be evaluated.  At these times the background is slow and no epileptiform activity is noted.     Thana FarrLeslie Roseland Braun, MD [facility] [phone] 04/06/2013, 3:58 PM

## 2013-04-06 NOTE — Progress Notes (Signed)
ANTICOAGULATION CONSULT NOTE - Follow Up Consult  Pharmacy Consult for heparin Indication: NSTEMI  Labs:  Recent Labs  07-10-13 1413  07-10-13 1446  07-10-13 2245  04/06/2013 0439 03/30/2013 0825 04/06/2013 0930 03/31/2013 1245  03/30/2013 1600 03/26/2013 1900 04/15/2013 1937 04/06/13 0400  HGB  --   --   --   < >  --   --  10.5*  --   --   --   < > 11.4* 10.0* 12.2 11.1*  HCT  --   --   --   < >  --   --  32.5*  --   --   --   < > 36.7 32.3* 36.0 34.1*  PLT  --   --   --   --   --   --  135*  --   --   --   --  111* 124*  --  PLATELET CLUMPS NOTED ON SMEAR, COUNT APPEARS DECREASED  APTT 175*  --   --   --   --   --   --   --   --   --   --   --   --   --  90*  LABPROT 24.5*  --   --   --   --   --  52.8* 20.7*  --   --   --  21.1*  --   --   --   INR 2.29*  --   --   --   --   --  6.27* 1.84*  --   --   --  1.89*  --   --   --   HEPARINUNFRC  --   --   --   --   --   < > >2.20*  --  0.15*  --   --  <0.10*  --   --  0.11*  CREATININE  --   --   --   < >  --   --  4.06*  3.98*  --   --   --   < > 4.53* 4.89* 4.60* 3.50*  CKTOTAL  --   --  350*  --   --   --   --   --   --   --   --   --   --   --   --   CKMB  --   --  20.3*  --   --   --   --   --   --   --   --   --   --   --   --   TROPONINI  --   < > 7.91*  --  14.40*  --   --  10.26*  --  8.78*  --   --   --   --   --   < > = values in this interval not displayed.   Assessment: 58yo female subtherapeutic on heparin after resumed post-cath.  Goal of Therapy:  Heparin level 0.3-0.7 units/ml   Plan:  Will increase heparin gtt by 2 units/kg/hr to 900 units/hr and check level in 8hr.  Vernard GamblesVeronda Naheem Mosco, PharmD, BCPS  04/06/2013,6:42 AM

## 2013-04-06 NOTE — Progress Notes (Signed)
Nursing: Patient admitted from IR at [id number].  Assessment in doc flow sheets. Blood pressure upon arrival SBP 60's. Levophed increased to 50 mcg, Dobutamine at 10 mcg, and Sodium Bicarbonate infusion at 100 ml/hr. CCM camera into room, updated on pt's current assessment, events in cath lab holding area and IR. Orders received. Dobutamine was stooped and Dopamine was started. Dopamine was weaned off when patient blood pressure became >200 SBP and HR 150 sinus tach with multiple PVC's present.   At 2035: Patient's family at bedside . Updated on pt's current condition, questions answered and contact information obtained.

## 2013-04-06 NOTE — Progress Notes (Signed)
Echocardiogram 2D Echocardiogram has been performed.  Faith BasemanReel, Faith [name] M 04/06/2013, 10:58 AM

## 2013-04-06 NOTE — Progress Notes (Signed)
Subjective:  58 y/o ESRD, DM2 and CAD s/p CABG admitted on 04/02/2013 with NSTEMI (peak trop 14.4) and decreased mental status (? Seizure like activity).   Underwent LHC on 04/08/2013. Post cath had respiratory/cardiac arrest. ROSC was obtained after 5 min CPR and 2 runs Epi.   LHC 03/26/2013: EF 60%, severe CAD with total occlusion of mid LAD, LCx, and proximal RCA, patent SVG to RCA with mild in-stent restenosis (< 50%), patent SVG to obtuse marginal, patent LIMA to LAD.   Remains intubated. On vasopressin and levophed 3. EEG done. Brain CT/MRI pending. Lactate yesterday 10.8. Femoral CVP 24  Objective: Vital signs in last 24 hours: Filed Vitals:   04/06/13 0600 04/06/13 0615 04/06/13 0630 04/06/13 0645  BP: 89/49 52/16 81/46  71/45  Pulse: 52 53 52 51  Temp:      TempSrc:      Resp: 28 28 28 28   Height:      Weight:      SpO2: 100% 100% 100% 100%   Weight change: 3 lb 4.9 oz (1.5 kg)  Intake/Output Summary (Last 24 hours) at 04/06/13 0729 Last data filed at 04/06/13 0647  Gross per 24 hour  Intake 2656.35 ml  Output    976 ml  Net 1680.35 ml   General: intubated/sedated HEENT: +ETT in place Cardiac: RRR, no rubs, murmurs or gallops Pulm: clear to auscultation bilaterally, moving normal volumes of air Abd: soft, nontender, nondistended, BS present Ext: warm and well perfused, no pedal edema. No pulses palpated Neuro: intubated sedated  Lab Results: Basic Metabolic Panel:  Recent Labs Lab 04/20/2013 0439  04/02/2013 1900 04/08/2013 1937 04/06/13 0400  NA 138  140  < > 141 140 139  K 3.2*  3.2*  < > 4.6 4.7 4.1  CL 95*  96  < > 93* 100 88*  CO2 26  24  < > 20  --  20  GLUCOSE 59*  60*  < > 176* 170* 246*  BUN 18  18  < > 25* 24* 25*  CREATININE 4.06*  3.98*  < > 4.89* 4.60* 3.50*  CALCIUM 8.2*  8.0*  < > 8.0*  --  7.4*  MG  --   --   --   --  2.2  PHOS 3.0  --   --   --  3.3  < > = values in this interval not displayed. Liver Function Tests:  Recent Labs Lab  04/04/2013 1600 04/06/13 0400  AST 295*  --   ALT 41*  --   ALKPHOS 231*  --   BILITOT 0.6  --   PROT 7.6  --   ALBUMIN 3.4* 3.5   CBC:  Recent Labs Lab 04/07/2013 1222  04/02/2013 1900 04/08/2013 1937 04/06/13 0400  WBC 10.2  < > 14.1*  --  14.8*  NEUTROABS 7.4  --   --   --   --   HGB 11.4*  < > 10.0* 12.2 11.1*  HCT 35.4*  < > 32.3* 36.0 34.1*  MCV 85.3  < > 87.5  --  84.4  PLT 151  < > 124*  --  PLATELET CLUMPS NOTED ON SMEAR, COUNT APPEARS DECREASED  < > = values in this interval not displayed. Cardiac Enzymes:  Recent Labs Lab 04/12/2013 1446 04/02/2013 2245 04/08/2013 0825 04/04/2013 1245  CKTOTAL 350*  --   --   --   CKMB 20.3*  --   --   --   TROPONINI 7.91* 14.40* 10.26*  8.78*   BNP:  Recent Labs Lab 03/25/2013 2245  PROBNP 44743.0*   CBG:  Recent Labs Lab 09-19-2013 0822 09-19-2013 0903 09-19-2013 1101 09-19-2013 1225 09-19-2013 1429 09-19-2013 1926  GLUCAP 69* 72 76 133* 144* 169*   Hemoglobin A1C:  Recent Labs Lab 03/30/2013 2245  HGBA1C 10.7*   Fasting Lipid Panel:  Recent Labs Lab 09-19-2013 0439  CHOL 118  HDL 42  LDLCALC 54  TRIG 108  CHOLHDL 2.8   Coagulation:  Recent Labs Lab 04/03/2013 1413 09-19-2013 0439 09-19-2013 0825 09-19-2013 1600  LABPROT 24.5* 52.8* 20.7* 21.1*  INR 2.29* 6.27* 1.84* 1.89*   Cardiac Studies: CABG in 2002 (left internal mammary to the left anterior descending coronary artery, reversed saphenous vein graft to the distal right coronary artery, reversed saphenous vein graft to the obtuse marginal coronary artery),   echo 9/27/11grade 2 diastolic dysfunction, EF of 60-65%  LHC Jan 2011 which showed patent grafts and an 80% long, tubular AV groove Circ lesion not amendable to intervention  Studies/Results: Dg Chest 2 View  04/08/2013   CLINICAL DATA:  Chest pain, shortness of breath.  EXAM: CHEST  2 VIEW  COMPARISON:  April 02, 2013.  FINDINGS: Stable cardiomegaly. Status post coronary artery bypass graft. Distal tips of  dialysis catheter extending from inferior vena cava are unchanged in position within the right atrium. Stable central pulmonary vascular congestion is noted. Bilateral pulmonary edema noted on prior exam does appear to be significantly improved. No pneumothorax or significant pleural effusion is noted.  IMPRESSION: Bilateral pulmonary edema noted on prior exam does appear to be significantly improved, although mild central pulmonary vascular congestion remains.   Electronically Signed   By: Roque LiasJames  Green M.D.   On: 04/14/2013 12:13   Dg Chest Port 1 View  Mar 07, 2013   CLINICAL DATA:  Endotracheal tube placement.  EXAM: PORTABLE CHEST - 1 VIEW  COMPARISON:  Single view of the chest 04/05/1999.  FINDINGS: Endotracheal tube is in place with the tip 1.2 cm above the carina. The tube could be withdrawn 1-2 cm for better positioning. IVC approach central venous catheter has been withdrawn with tip now in the right atrium. There is cardiomegaly and interstitial edema. No pneumothorax.  IMPRESSION: ET tube tip is 1.2 cm above the carina. The tube could be withdrawn 1- the 2 cm for better positioning.  IVC approach central venous catheter tip is now within the right atrium.  Cardiomegaly and interstitial edema.   Electronically Signed   By: Drusilla Kannerhomas  Dalessio M.D.   On: 0Jan 15, 2015 16:34   Medications: I have reviewed the patient's current medications. Scheduled Meds: . antiseptic oral rinse  15 mL Mouth Rinse QID  . aspirin EC  81 mg Oral Daily  . calcitRIOL  1 mcg Oral Once per day on Mon Wed Fri  . chlorhexidine  15 mL Mouth Rinse BID  . cinacalcet  30 mg Oral Q breakfast  . cinacalcet  90 mg Oral QPC supper  . hydrocortisone sod succinate (SOLU-CORTEF) inj  100 mg Intravenous Q8H  . insulin aspart  0-9 Units Subcutaneous 6 times per day  . insulin glargine  20 Units Subcutaneous QHS  . multivitamin  1 tablet Oral QHS  . pantoprazole  40 mg Oral Daily  . piperacillin-tazobactam (ZOSYN)  IV  2.25 g  Intravenous 4 times per day  . sevelamer carbonate  800 mg Oral TID WC   Continuous Infusions: . sodium chloride 20 mL/hr at 09-19-2013 2206  . sodium chloride 10  mL/hr at 04/06/13 0053  . fentaNYL infusion INTRAVENOUS 75 mcg/hr (04/06/13 0410)  . heparin 900 Units/hr (04/06/13 0647)  . midazolam (VERSED) infusion 2 mg/hr (04/06/13 0410)  . norepinephrine (LEVOPHED) Adult infusion 4 mcg/min (04/06/13 0620)  . dialysis replacement fluid (prismasate) 200 mL/hr at 04/17/2013 2206  . dialysate (PRISMASATE) 1,500 mL/hr at 04/06/13 0514  .  sodium bicarbonate  infusion 1000 mL 100 mL/hr at 04/06/13 0410  . sodium bicarbonate (isotonic) 1000 mL infusion 400 mL/hr at 04/06/13 0649  . vasopressin (PITRESSIN) infusion - *FOR SHOCK* 0.03 Units/min (04/06/13 0410)   PRN Meds:.heparin, ondansetron (ZOFRAN) IV, polyvinyl alcohol, sodium chloride Assessment/Plan: Principal Problem:   NSTEMI (non-ST elevated myocardial infarction), Troponin 9.75 Active Problems:   ESRD (end stage renal disease) on dialysis   Diabetes mellitus, type 2   CAD (coronary artery disease) of artery bypass graft   Tardive dyskinesia   Acute respiratory failure   Cardiogenic shock  1. Shock/lactic acidosis: unclear etiology 2. VDRF 3. NSTEMI/CAD: anatomy stable per cath 04/16/2013. No culprit lesion identified EF 60% 4. ESRD: management per nephrology pt on CRRT 5. DM2 6. Toxic encephalopathy 7. Cardiopulmonary arrest   LOS: 2 days   Christen Bame, MD 04/06/2013, 7:29 AM  Patient seen and examined with Dr, Cordelia Poche. We discussed all aspects of the encounter. I agree with the assessment and plan as stated above.   She remains profoundly ill with lactic acidosis and shock. EF and coronary anatomy quite stable despite NSTEMI. Doubt this is primary cardiac in nature. Continue supportive care. Will ask CCM to assume primary management. We will follow as needed.   Joseandres Mazer,MD 9:09 AM

## 2013-04-06 NOTE — Progress Notes (Signed)
NUTRITION FOLLOW-UP  DOCUMENTATION CODES Per approved criteria  -Obesity Unspecified   INTERVENTION: If prolonged intubation expected, recommend initiation of nutrition support. If enteral nutrition desired, recommend initiation of Vital High Protein via enteral feeding tube at 25 ml/hr, this is the goal rate. Add 30 ml Prostat liquid protein via tube five times daily. Goal regimen will provide: 1100 kcal (22 kcal/kg IBW), 128 grams protein (100%), 502 ml free water. RD to continue to follow nutrition care plan.  NUTRITION DIAGNOSIS: Increased nutrient needs related to ESRD, on HD as evidenced by estimated nutrition needs. Ongoing.  Goal: Enteral nutrition to provide 60-70% of estimated calorie needs (22-25 kcals/kg ideal body weight) and 100% of estimated protein needs, based on ASPEN guidelines for permissive underfeeding in critically ill obese individuals.  Monitor:  weight trends, lab trends, I/O's, initiation of nutrition support, respiratory status  ASSESSMENT: Patient with PMH of CAD with CABG, ESRD, DM2, CVA and AKA; presented to ER with weakness after placement of new AV graft and removal of old graft, some bleeding post procedure.    Underwent L heart cath, angiography, L ventriculography, bypass graft angiography on 2/13. Patient suffered cardiac arrest s/p cath and taken to ICU. Per MD note, plan for initiation of TF if pt remains intubated today. Currently on vasopressin and levophed.   Patient is currently intubated on ventilator support.  MV: 7.8 L/min Temp (24hrs), Avg:97 F (36.1 C), Min:94.8 F (34.9 C), Max:98 F (36.7 C)  Renal following 2/2 ESRD, CRRT initiated 2/13.  Neuro consulted 2/2 AMS - suspect pt with toxic-metabolic encephalopathy. EEG pending. Brain CT/MRI pending.  Potassium, magnesium and phosphorus are all WNL. CBG's: 144, 169, 250  Height: Ht Readings from Last 1 Encounters:  18-May-2013 5\' 4"  (1.626 m)    Weight: Wt Readings from Last 1  Encounters:  04/06/13 176 lb 5.9 oz (80 kg)  Admit wt 169 lb   BMI:  31.6 kg/m2 -- adjusted for AKA (using adjusted weight)  Estimated Nutritional Needs: Kcal: 1434 Underfeeding Kcal Goal: 1079 - 1227 kcal Protein: 120 - 140 gm Fluid: 1.2 liters  Skin: R arm incision  Diet Order:      Intake/Output Summary (Last 24 hours) at 04/06/13 1012 Last data filed at 04/06/13 1000  Gross per 24 hour  Intake 3342.73 ml  Output   1493 ml  Net 1849.73 ml    Last BM: PTA  Labs:   Recent Labs Lab 18-May-2013 0439  18-May-2013 1600 18-May-2013 1900 18-May-2013 1937 04/06/13 0400  NA 138  140  < > 135* 141 140 139  K 3.2*  3.2*  < > 4.4 4.6 4.7 4.1  CL 95*  96  < > 94* 93* 100 88*  CO2 26  24  --  11* 20  --  20  BUN 18  18  < > 23 25* 24* 25*  CREATININE 4.06*  3.98*  < > 4.53* 4.89* 4.60* 3.50*  CALCIUM 8.2*  8.0*  --  8.8 8.0*  --  7.4*  MG  --   --   --   --   --  2.2  PHOS 3.0  --   --   --   --  3.3  GLUCOSE 59*  60*  < > 210* 176* 170* 246*  < > = values in this interval not displayed.  CBG (last 3)   Recent Labs  18-May-2013 1429 18-May-2013 1926 04/06/13 0758  GLUCAP 144* 169* 250*    Scheduled Meds: . antiseptic  oral rinse  15 mL Mouth Rinse QID  . aspirin EC  81 mg Oral Daily  . calcitRIOL  1 mcg Oral Once per day on Mon Wed Fri  . chlorhexidine  15 mL Mouth Rinse BID  . cinacalcet  30 mg Oral Q breakfast  . cinacalcet  90 mg Oral QPC supper  . hydrocortisone sod succinate (SOLU-CORTEF) inj  100 mg Intravenous Q8H  . insulin aspart  0-9 Units Subcutaneous 6 times per day  . insulin glargine  20 Units Subcutaneous QHS  . multivitamin  1 tablet Oral QHS  . pantoprazole  40 mg Oral Daily  . piperacillin-tazobactam (ZOSYN)  IV  2.25 g Intravenous 4 times per day  . sevelamer carbonate  800 mg Oral TID WC    Continuous Infusions: . sodium chloride 20 mL/hr at 04/02/2013 2206  . sodium chloride 10 mL/hr at 04/06/13 0053  . fentaNYL infusion INTRAVENOUS 100  mcg/hr (04/06/13 0955)  . heparin 900 Units/hr (04/06/13 0647)  . midazolam (VERSED) infusion 1 mg/hr (04/06/13 1010)  . norepinephrine (LEVOPHED) Adult infusion 4 mcg/min (04/06/13 0620)  . dialysis replacement fluid (prismasate) 200 mL/hr at 04/01/2013 2206  . dialysate (PRISMASATE) 1,500 mL/hr at 04/06/13 0853  .  sodium bicarbonate  infusion 1000 mL 100 mL/hr at 04/06/13 0410  . sodium bicarbonate (isotonic) 1000 mL infusion 400 mL/hr at 04/06/13 0917  . vasopressin (PITRESSIN) infusion - *FOR SHOCK* 0.03 Units/min (04/06/13 0410)    Jarold Motto MS, RD, LDN Inpatient Registered Dietitian Pager: [phone] After-hours pager: [phone]

## 2013-04-06 NOTE — Progress Notes (Signed)
Nursing: Dr. Katrinka BlazingSmith updated on patient's current condition at 2140.  Discussed prior events of the day, current labs, medication , infusion rates , assessment and ventilator settings. Orders received. Family updated about current status. Questions answered. At 2240 - CRRT was started without difficulty. Blood pressure continues to be labile.

## 2013-04-07 DIAGNOSIS — I469 Cardiac arrest, cause unspecified: Secondary | ICD-10-CM

## 2013-04-07 LAB — RENAL FUNCTION PANEL
Albumin: 3.5 g/dL (ref 3.5–5.2)
BUN: 16 mg/dL (ref 6–23)
CO2: 24 meq/L (ref 19–32)
Calcium: 8 mg/dL — ABNORMAL LOW (ref 8.4–10.5)
Chloride: 98 meq/L (ref 96–112)
Creatinine, Ser: 2.04 mg/dL — ABNORMAL HIGH (ref 0.50–1.10)
GFR calc Af Amer: 30 mL/min — ABNORMAL LOW
GFR calc non Af Amer: 26 mL/min — ABNORMAL LOW
Glucose, Bld: 99 mg/dL (ref 70–99)
Phosphorus: 2.6 mg/dL (ref 2.3–4.6)
Potassium: 4.5 meq/L (ref 3.7–5.3)
Sodium: 139 meq/L (ref 137–147)

## 2013-04-07 LAB — HEPARIN LEVEL (UNFRACTIONATED): Heparin Unfractionated: 0.52 [IU]/mL (ref 0.30–0.70)

## 2013-04-07 LAB — MAGNESIUM: Magnesium: 2.5 mg/dL (ref 1.5–2.5)

## 2013-04-07 LAB — GLUCOSE, CAPILLARY
GLUCOSE-CAPILLARY: 106 mg/dL — AB (ref 70–99)
GLUCOSE-CAPILLARY: 140 mg/dL — AB (ref 70–99)
Glucose-Capillary: 109 mg/dL — ABNORMAL HIGH (ref 70–99)
Glucose-Capillary: 79 mg/dL (ref 70–99)

## 2013-04-07 LAB — CBC
HEMATOCRIT: 34.4 % — AB (ref 36.0–46.0)
HEMOGLOBIN: 11.2 g/dL — AB (ref 12.0–15.0)
MCH: 27.5 pg (ref 26.0–34.0)
MCHC: 32.6 g/dL (ref 30.0–36.0)
MCV: 84.3 fL (ref 78.0–100.0)
Platelets: 122 10*3/uL — ABNORMAL LOW (ref 150–400)
RBC: 4.08 MIL/uL (ref 3.87–5.11)
RDW: 16.8 % — ABNORMAL HIGH (ref 11.5–15.5)
WBC: 13.3 10*3/uL — ABNORMAL HIGH (ref 4.0–10.5)

## 2013-04-07 LAB — APTT: aPTT: 178 s — ABNORMAL HIGH (ref 24–37)

## 2013-04-07 LAB — PROCALCITONIN: Procalcitonin: 4.18 ng/mL

## 2013-04-07 MED ORDER — HYDROCORTISONE NA SUCCINATE PF 100 MG IJ SOLR
50.0000 mg | Freq: Three times a day (TID) | INTRAMUSCULAR | Status: DC
  Administered 2013-04-07 – 2013-04-11 (×12): 50 mg via INTRAVENOUS
  Filled 2013-04-07 (×15): qty 1

## 2013-04-07 MED ORDER — FENTANYL CITRATE 0.05 MG/ML IJ SOLN: 25.0000 ug | INTRAMUSCULAR | Status: DC | PRN

## 2013-04-07 MED ORDER — INSULIN GLARGINE 100 UNIT/ML ~~LOC~~ SOLN: 10.0000 [IU] | Freq: Every day | SUBCUTANEOUS | Status: DC

## 2013-04-07 MED ORDER — CALCITRIOL 0.5 MCG PO CAPS
1.0000 ug | ORAL_CAPSULE | ORAL | Status: DC
  Filled 2013-04-07 (×3): qty 2

## 2013-04-07 MED ORDER — PIPERACILLIN-TAZOBACTAM IN DEX 2-0.25 GM/50ML IV SOLN
2.2500 g | Freq: Three times a day (TID) | INTRAVENOUS | Status: DC
  Administered 2013-04-07 – 2013-04-11 (×12): 2.25 g via INTRAVENOUS
  Filled 2013-04-07 (×14): qty 50

## 2013-04-07 MED ORDER — ASPIRIN 81 MG PO CHEW
81.0000 mg | CHEWABLE_TABLET | Freq: Every day | ORAL | Status: DC
  Administered 2013-04-07 – 2013-04-11 (×5): 81 mg
  Filled 2013-04-07 (×4): qty 1

## 2013-04-07 MED ORDER — MIDAZOLAM HCL 2 MG/2ML IJ SOLN: 1.0000 mg | INTRAMUSCULAR | Status: DC | PRN

## 2013-04-07 MED ORDER — LIDOCAINE HCL (PF) 1 % IJ SOLN
INTRAMUSCULAR | Status: AC
  Filled 2013-04-07: qty 5

## 2013-04-07 MED ORDER — NEPRO/CARBSTEADY PO LIQD
1000.0000 mL | ORAL | Status: DC
  Administered 2013-04-08 – 2013-04-11 (×4): 1000 mL
  Filled 2013-04-07 (×3): qty 1000

## 2013-04-07 NOTE — Progress Notes (Signed)
Patient ID: Faith [name], female   DOB: 1955/10/09, 58 y.o.   MRN: [id number] Right thigh tunneled hemodialysis catheter migrated slightly and one suture was "pulled out". 2  2-0 silk sutures placed to secure catheter under local 1% Xylocaine anesthesia

## 2013-04-07 NOTE — Progress Notes (Signed)
[facility] Progress Note   Subjective: Off pressors today, BP 100-120 syst  Exam  Blood pressure 162/138, pulse 60, temperature 96.2 F (35.7 C), temperature source Oral, resp. rate 19, height 5\' 4"  (1.626 m), weight 79.3 kg (174 lb 13.2 oz), SpO2 100.00%. On vent, opens eyes to voice Clear bilat RRR no rub or gallop Abd soft, nt, nd, no ascites Ext no UE or LE edema Access R upper arm examined, dressings removed > diffuse mild edema, minimal erythema, no drainage, exam c/w post-op changes, +strong bruit over AVG R thigh TDC w clean exit site Neuro opens eyes, moves arms > legs  Dialysis: MWF [facility] 3.5h   76kg   2K/2.0Bath  Heparin 5000   R fem TDC (new R upper arm AVG 2/10)  Calcitriol po 1ug tiw    Epo none     Venofer none Last labs: Hb 11.6 tsat 21% ferritin 1621 phos 2.8  pth 231  CXR L atx, o/w clear  Assessment/Plan: 1 NSTEMI / hx CABG- patent grafts by cath 2/13 2 Cardiac arrest- post cath, CPR ~5 min 3 Hypotension- unclear cause, slight fever/wbc. Blood cx's negative, on empiric vanc, will d/c soon if final cx results neg 4 HTN/volume- 4kg up, no pulm edema 5 Anemia Hb 11, no esa for now 6 2HPTH- hold binders, cont sensipar + po vit D 7 S/P removal degenerated RUA AVG and placement new AVG at same site 2/10 (Dr Darrick Penna)  8 ESRD- will stop CRRT, plan HD tomorrow  Plan- HD tomorrow, stop CRRT   Vinson Moselle MD  pager [phone]    cell [phone]  04/07/2013, 10:22 AM     Recent Labs Lab 04/06/13 0400 04/06/13 1600 04/07/13 0337  NA 139 138 139  K 4.1 3.6* 4.5  CL 88* 91* 98  CO2 20 27 24   GLUCOSE 246* 165* 99  BUN 25* 21 16  CREATININE 3.50* 2.67* 2.04*  CALCIUM 7.4* 7.6* 8.0*  PHOS 3.3 1.8* 2.6    Recent Labs Lab 03/29/2013 1600 04/06/13 0400 04/06/13 1600 04/07/13 0337  AST 295*  --   --   --   ALT 41*  --   --   --   ALKPHOS 231*  --   --   --   BILITOT 0.6  --   --   --   PROT 7.6  --   --   --   ALBUMIN 3.4* 3.5 3.4* 3.5     Recent Labs Lab 03/29/2013 1222  04/04/2013 1900 03/26/2013 1937 04/06/13 0400 04/07/13 0337  WBC 10.2  < > 14.1*  --  14.8* 13.3*  NEUTROABS 7.4  --   --   --   --   --   HGB 11.4*  < > 10.0* 12.2 11.1* 11.2*  HCT 35.4*  < > 32.3* 36.0 34.1* 34.4*  MCV 85.3  < > 87.5  --  84.4 84.3  PLT 151  < > 124*  --  PLATELET CLUMPS NOTED ON SMEAR, COUNT APPEARS DECREASED 122*  < > = values in this interval not displayed. Marland Kitchen antiseptic oral rinse  15 mL Mouth Rinse QID  . aspirin  81 mg Per Tube Daily  . [START ON 04/08/2013] calcitRIOL  1 mcg Per Tube Once per day on Mon Wed Fri  . chlorhexidine  15 mL Mouth Rinse BID  . feeding supplement (NEPRO CARB STEADY)  1,000 mL Per Tube Q24H  . hydrocortisone sod succinate (SOLU-CORTEF) inj  50 mg Intravenous Q8H  .  insulin aspart  0-15 Units Subcutaneous 6 times per day  . pantoprazole sodium  40 mg Per Tube Q1200   . sodium chloride 20 mL/hr at 08/03/13 2206  . sodium chloride 10 mL/hr at 04/06/13 0053  . fentaNYL infusion INTRAVENOUS 125 mcg/hr (04/07/13 0800)  . heparin 1,000 Units/hr (04/06/13 2245)  . norepinephrine (LEVOPHED) Adult infusion Stopped (04/07/13 16100712)  . dialysis replacement fluid (prismasate) 200 mL/hr at 08/03/13 2206  . dialysis replacement fluid (prismasate) 400 mL/hr at 04/06/13 2351  . dialysate (PRISMASATE) 1,500 mL/hr at 04/07/13 0900   fentaNYL, heparin, midazolam, ondansetron (ZOFRAN) IV, polyvinyl alcohol, sodium chloride

## 2013-04-07 NOTE — Progress Notes (Signed)
Please disregard the previous note about comfort care, this was written on the wrong patient.   Faith Moselleob Steffani Dionisio MD (pgr) [phone]    [phone] 04/07/2013, 4:18 PM

## 2013-04-07 NOTE — Progress Notes (Addendum)
Name: Faith [name] MRN: [id number] DOB: 1955/05/26    ADMISSION DATE:  03/24/2013 CONSULTATION DATE:  04/01/2013  REFERRING MD :  Allyson Sabal  CHIEF COMPLAINT:  Cardiac Arrest  BRIEF PATIENT DESCRIPTION:  58 y.o. F admitted on 2/9 for profuse bleeding from right arm graft site.  Graft was clotted, failed thrombectomy.  Discharged from ER and sent home, presented again 2/12 for weakness.  Workup in ED with elevated troponins/NSTEMI.  Pt taken to cath lab 2/13 and suffered cardiac arrest post cath.  PCCM consulted for further management.  SIGNIFICANT EVENTS / STUDIES:  2/9 profuse bleeding from right arm graft site. Failed thrombectomy. Discharged from ER 2/13 Back to ER with weakness/lethargy, found to have NSTEMI/elevated troponins. Cardiac cath: Severe native vessel coronary disease with total occlusion of the proximal to mid LAD, occlusion of the circumflex in its mid-segment, and total occlusion of the proximal RCA. Patent SVG to the right coronary with mild to moderate in-stent restenosis in the proximal segment. Patent saphenous vein graft to the obtuse marginal. Patent left internal mammary graft to the LAD 2/13 Neuro consult: Suspect TME. EEG, MRI recommended 2/14 Extended conversation with pt's husband who acknowledged that pt has been in failing health for many years with declining QOL. Advanced directives and limitations of care discussed. No further ACLS under any circumstances. Cont aggressive support through WE. Once extubated, no re-intubation. 2/14 CT head: NAD 2/14 EEG: This is a technically difficult record secondary to the predominance of muscle and movement artifact. On only rare occasions can a background activity be evaluated. At these times the background is slow and no epileptiform activity is noted.  2/14 Echo: LVEF 30-35%. Diffuse HK. Grade 2 diastolic dysfunction. RV moderately dilated. PASP estimate 54 mmHg 2/15 Weaning vasopressors. More responsive. + F/C  intermittently   LINES / TUBES: R fem HD cath >>  ETT 12/13 >>> Left radial A-line 2/13 >>  L femoral TLC 2/13 >>   CULTURES:   ANTIBIOTICS:    SUBJECTIVE:  More responsive. + F/C intermittently  VITAL SIGNS: Temp:  [94.8 F (34.9 C)-98.7 F (37.1 C)] 98.7 F (37.1 C) (02/15 0400) Pulse Rate:  [51-61] 52 (02/15 0500) Resp:  [17-29] 18 (02/15 0500) BP: (48-215)/(13-138) 162/138 mmHg (02/14 1500) SpO2:  [89 %-100 %] 97 % (02/15 0500) Arterial Line BP: (73-170)/(34-71) 136/61 mmHg (02/15 0500) FiO2 (%):  [30 %] 30 % (02/15 0400) Weight:  [79.3 kg (174 lb 13.2 oz)] 79.3 kg (174 lb 13.2 oz) (02/15 0456) HEMODYNAMICS: CVP:  [24 mmHg] 24 mmHg VENTILATOR SETTINGS: Vent Mode:  [-] PRVC FiO2 (%):  [30 %] 30 % Set Rate:  [18 bmp-28 bmp] 18 bmp Vt Set:  [440 mL] 440 mL PEEP:  [5 cmH20] 5 cmH20 Plateau Pressure:  [25 cmH20-28 cmH20] 25 cmH20 INTAKE / OUTPUT: Intake/Output     02/14 0701 - 02/15 0700   I.V. (mL/kg) 1504.3 (19)   NG/GT 270   IV Piggyback 150   Total Intake(mL/kg) 1924.3 (24.3)   Other 2063   Total Output 2063   Net -138.7         PHYSICAL EXAMINATION: General:  Chronically ill, minimally responsive Neuro:  Not F/C HEENT: WNL Cardiovascular: Reg, no M Lungs:  CTA bilaterally Abdomen:  BS x 4, soft, NT/ND. Musculoskeletal:  Right BKA. No edema.   LABS: I have reviewed all of today's lab results. Relevant abnormalities are discussed in the A/P section  CXR: NNF  ASSESSMENT / PLAN:  PULMONARY A: Acute  Respiratory Failure P:   Vent settings reviewed Cont vent bundle Daily SBT Once optimized, extubate with no re-intubation if fails (per discussion with husband 2/14)  CARDIOVASCULAR A:  S/p Cardiac Arrest Elevated Troponins CAD P:  Further eval and mgmt per Cards Wean NE to off for MAP goal to 60 mmHg  RENAL A: ESRD Metabolic Acidosis, resolved P:   CRRT per Renal  GASTROINTESTINAL A:  No acute issues. P:   SUP:   Pantoprazole. Cont TFs @ current rate  HEMATOLOGIC A:  Mild anemia without acute blood loss Mild thrombocytopenia P:  DVT px: full dose heparin CBC intermittently  INFECTIOUS A:   No acute issues. P:   Micro and abx as above  ENDOCRINE A:  DM II, controlled P:   Lantus DC'd  Cont SSI  NEUROLOGIC A:  Acute encephalopathy, improving Chronic cognitive impairment, NOS Tardive Dyskinesia P:   Minimize sedation Daily WUA    I have personally obtained a history, examined the patient, evaluated laboratory and imaging results, formulated the assessment and plan and placed orders.  CRITICAL CARE: The patient is critically ill with multiple organ systems failure and requires high complexity decision making for assessment and support, frequent evaluation and titration of therapies, application of advanced monitoring technologies and extensive interpretation of multiple databases. Critical Care Time devoted to patient care services described in this note is 35 minutes.   Billy Fischeravid Annaston Upham, MD ; [facility] Mobile [phone].  After 5:30 PM or weekends, call [phone]

## 2013-04-07 NOTE — Progress Notes (Signed)
ANTICOAGULATION CONSULT NOTE - Follow Up Consult  Pharmacy Consult for heparin Indication: NSTEMI  Labs:  Recent Labs  04/06/2013 1413  04/17/2013 1446  04/06/2013 2245  04/10/2013 0439 04/12/2013 0825  04/01/2013 1245  04/01/2013 1600 03/26/2013 1900 04/12/2013 1937 04/06/13 0400 04/06/13 1500 04/06/13 1600 04/07/13 0337  HGB  --   --   --   < >  --   --  10.5*  --   --   --   < > 11.4* 10.0* 12.2 11.1*  --   --  11.2*  HCT  --   --   --   < >  --   --  32.5*  --   --   --   < > 36.7 32.3* 36.0 34.1*  --   --  34.4*  PLT  --   --   --   --   --   < > 135*  --   --   --   --  111* 124*  --  PLATELET CLUMPS NOTED ON SMEAR, COUNT APPEARS DECREASED  --   --  122*  APTT 175*  --   --   --   --   --   --   --   --   --   --   --   --   --  90*  --   --  178*  LABPROT 24.5*  --   --   --   --   --  52.8* 20.7*  --   --   --  21.1*  --   --   --   --   --   --   INR 2.29*  --   --   --   --   --  6.27* 1.84*  --   --   --  1.89*  --   --   --   --   --   --   HEPARINUNFRC  --   --   --   --   --   < > >2.20*  --   < >  --   --  <0.10*  --   --  0.11* 0.30  --  0.52  CREATININE  --   --   --   < >  --   --  4.06*  3.98*  --   --   --   < > 4.53* 4.89* 4.60* 3.50*  --  2.67* 2.04*  CKTOTAL  --   --  350*  --   --   --   --   --   --   --   --   --   --   --   --   --   --   --   CKMB  --   --  20.3*  --   --   --   --   --   --   --   --   --   --   --   --   --   --   --   TROPONINI  --   < > 7.91*  --  14.40*  --   --  10.26*  --  8.78*  --   --   --   --   --   --   --   --   < > = values in this interval not displayed.   Assessment: 58yo female on  heparin after resumed post-cath on 2/13 which showed EF 60%, severe CAD with total occlusion of mid LAD, LCx, and proximal RCA, patent SVG to RCA with mild in-stent restenosis, patent SVG to OM, patent LIMA to LAD. Patient coded post-cath and has been intubated since then. HL remains therapeutic at 0.52. H/H, plt low but stable with no reported s/s bleeding.    Goal of Therapy:  Heparin level 0.3-0.7 units/ml   Plan:  - Continue heparin at 1000 units/hr - Daily HL, CBC - Monitor for s/s bleeding  Faith BilletErika K. von [name], PharmD Clinical Pharmacist - Resident Pager: [phone] Pharmacy: [phone] 04/07/2013 1:03 PM

## 2013-04-07 NOTE — Progress Notes (Deleted)
Had meeting with caretaker Tammy . She says Ms Cheree DittoGraham woudn't want to live like this, "on machines" and said patient told her that to just "let her go" if she needed machines to keep her alive.  We have decided on transitioning to comfort care including extubation and withdrawal of vasopressors.  No reintubation. She would like to be called before extubation so that she can be here.   Vinson Moselleob Rhea Thrun MD (pgr) [phone]    [phone]) [phone] 04/07/2013, 1:13 PM

## 2013-04-07 NOTE — Progress Notes (Signed)
Subjective: Patient remains intubated and sedated.  On evaluation off meds per nursing patient was able to open her eyes and nod appropriately to questions but dis not follow commands.  Objective: Current vital signs: BP 162/138  Pulse 60  Temp(Src) 96.2 F (35.7 C) (Oral)  Resp 19  Ht 5\' 4"  (1.626 m)  Wt 79.3 kg (174 lb 13.2 oz)  BMI 29.99 kg/m2  SpO2 100% Vital signs in last 24 hours: Temp:  [95.6 F (35.3 C)-98.7 F (37.1 C)] 96.2 F (35.7 C) (02/15 0723) Pulse Rate:  [51-62] 60 (02/15 0900) Resp:  [17-28] 19 (02/15 0900) BP: (68-215)/(26-138) 162/138 mmHg (02/14 1500) SpO2:  [89 %-100 %] 100 % (02/15 0900) Arterial Line BP: (73-170)/(34-62) 102/49 mmHg (02/15 0900) FiO2 (%):  [30 %] 30 % (02/15 0734) Weight:  [79.3 kg (174 lb 13.2 oz)] 79.3 kg (174 lb 13.2 oz) (02/15 0456)  Intake/Output from previous day: 02/14 0701 - 02/15 0700 In: 2131.9 [I.V.:1611.9; NG/GT:370; IV Piggyback:150] Out: 2290  Intake/Output this shift: Total I/O In: 70.4 [I.V.:50.4; NG/GT:20] Out: 175 [Other:175] Nutritional status:    Neurologic Exam: Mental Status:  Opens eyes when name called and seems to focus on examiner when on the left side of the bed.  Does not follow commands. No attempts at verbalizations noted.  Cranial Nerves:  II: patient does not respond confrontation bilaterally, pupils right 3 mm, left 3 mm,and fixed bilaterally  III,IV,VI: doll's response absent bilaterally.  V,VII: corneal reflex weak bilaterally  VIII: grossly intact IX,X: gag reflex reduced, XI: trapezius strength unable to test bilaterally  XII: tongue strength unable to test  Motor:  When I place my hand in hers she does squeeze my hands bilaterally although weakly Deep Tendon Reflexes:  Absent throughout.  Plantars:  mute on the left. Right BKA  Cerebellar:  Unable to perform   Lab Results: Basic Metabolic Panel:  Recent Labs Lab 04/04/2013 0439  03/29/2013 1600 04/04/2013 1900 04/09/2013 1937  04/06/13 0400 04/06/13 1600 04/07/13 0337  NA 138  140  < > 135* 141 140 139 138 139  K 3.2*  3.2*  < > 4.4 4.6 4.7 4.1 3.6* 4.5  CL 95*  96  < > 94* 93* 100 88* 91* 98  CO2 26  24  --  11* 20  --  20 27 24   GLUCOSE 59*  60*  < > 210* 176* 170* 246* 165* 99  BUN 18  18  < > 23 25* 24* 25* 21 16  CREATININE 4.06*  3.98*  < > 4.53* 4.89* 4.60* 3.50* 2.67* 2.04*  CALCIUM 8.2*  8.0*  --  8.8 8.0*  --  7.4* 7.6* 8.0*  MG  --   --   --   --   --  2.2  --  2.5  PHOS 3.0  --   --   --   --  3.3 1.8* 2.6  < > = values in this interval not displayed.  Liver Function Tests:  Recent Labs Lab 04/02/2013 0439 04/03/2013 1600 04/06/13 0400 04/06/13 1600 04/07/13 0337  AST  --  295*  --   --   --   ALT  --  41*  --   --   --   ALKPHOS  --  231*  --   --   --   BILITOT  --  0.6  --   --   --   PROT  --  7.6  --   --   --  ALBUMIN 3.6 3.4* 3.5 3.4* 3.5   No results found for this basename: LIPASE, AMYLASE,  in the last 168 hours No results found for this basename: AMMONIA,  in the last 168 hours  CBC:  Recent Labs Lab 04/08/2013 1222  04/03/2013 0439  04/15/2013 1600 04/12/2013 1900 03/28/2013 1937 04/06/13 0400 04/07/13 0337  WBC 10.2  --  10.0  --  12.6* 14.1*  --  14.8* 13.3*  NEUTROABS 7.4  --   --   --   --   --   --   --   --   HGB 11.4*  < > 10.5*  < > 11.4* 10.0* 12.2 11.1* 11.2*  HCT 35.4*  < > 32.5*  < > 36.7 32.3* 36.0 34.1* 34.4*  MCV 85.3  --  84.6  --  87.8 87.5  --  84.4 84.3  PLT 151  --  135*  --  111* 124*  --  PLATELET CLUMPS NOTED ON SMEAR, COUNT APPEARS DECREASED 122*  < > = values in this interval not displayed.  Cardiac Enzymes:  Recent Labs Lab 04/07/2013 1446 04/15/2013 2245 04/14/2013 0825 04/04/2013 1245  CKTOTAL 350*  --   --   --   CKMB 20.3*  --   --   --   TROPONINI 7.91* 14.40* 10.26* 8.78*    Lipid Panel:  Recent Labs Lab 03/28/2013 0439  CHOL 118  TRIG 108  HDL 42  CHOLHDL 2.8  VLDL 22  LDLCALC 54    CBG:  Recent Labs Lab  04/06/13 1158 04/06/13 1613 04/06/13 1940 04/06/13 2344 04/07/13 0348  GLUCAP 149* 161* 135* 107* 109*    Microbiology: Results for orders placed during the hospital encounter of 03/26/2013  MRSA PCR SCREENING     Status: None   Collection Time    03/27/2013  7:00 PM      Result Value Ref Range Status   MRSA by PCR NEGATIVE  NEGATIVE Final   Comment:            The GeneXpert MRSA Assay (FDA     approved for NASAL specimens     only), is one component of a     comprehensive MRSA colonization     surveillance program. It is not     intended to diagnose MRSA     infection nor to guide or     monitor treatment for     MRSA infections.  CULTURE, BLOOD (ROUTINE X 2)     Status: None   Collection Time    04/14/2013 12:59 PM      Result Value Ref Range Status   Specimen Description BLOOD LEFT HAND   Final   Special Requests BOTTLES DRAWN AEROBIC ONLY 4CC   Final   Culture  Setup Time     Final   Value: 04/04/2013 16:40     Performed at [facility]   Culture     Final   Value:        BLOOD CULTURE RECEIVED NO GROWTH TO DATE CULTURE WILL BE HELD FOR 5 DAYS BEFORE ISSUING A FINAL NEGATIVE REPORT     Performed at [facility]   Report Status PENDING   Incomplete  CULTURE, BLOOD (ROUTINE X 2)     Status: None   Collection Time    04/03/2013  1:07 PM      Result Value Ref Range Status   Specimen Description BLOOD LEFT HAND   Final   Special Requests  BOTTLES DRAWN AEROBIC ONLY 2CC   Final   Culture  Setup Time     Final   Value: 04/20/2013 16:40     [facility]   Culture     Final   Value:        BLOOD CULTURE RECEIVED NO GROWTH TO DATE CULTURE WILL BE HELD FOR 5 DAYS BEFORE ISSUING A FINAL NEGATIVE REPORT     [facility]formed at [facility]   Report Status PENDING   Incomplete    Coagulation Studies:  Recent Labs  03/25/2013 1413 03/30/2013 0439 03/29/2013 0825 04/06/2013 1600  LABPROT 24.5* 52.8* 20.7* 21.1*  INR 2.29* 6.27* 1.84* 1.89*     Imaging: Ir Fluoro Guide Cv Line Left  04/06/2013   CLINICAL DATA:  Cardiac arrest and need for additional central venous access. History of failed bilateral dialysis access and status post recent placement of right femoral tunneled hemodialysis catheter.  EXAM: NON-TUNNELED CENTRAL VENOUS CATHETER PLACEMENT WITH ULTRASOUND AND FLUOROSCOPIC GUIDANCE  FLUOROSCOPY TIME:  12 seconds.  PROCEDURE: Consent was obtained from the patient's husband.  The right groin was prepped with chlorhexidine in a sterile fashion, and a sterile drape was applied covering the operative field. Maximum barrier sterile technique with sterile gowns and gloves were used for the procedure. Local anesthesia was provided with 1% lidocaine.  Ultrasound was used to confirm patency of the left common femoral vein. After creating a small venotomy incision, a 21 gauge needle was advanced into the left common femoral vein under direct, real-time ultrasound guidance. Ultrasound image documentation was performed.  After establishing guidewire access, the venotomy was dilated. A 7 French, 20 cm Arrow triple-lumen central venous catheter was advanced over the wire. The catheter exit site was secured with 0-Prolene retention sutures.  COMPLICATIONS: None.  FINDINGS: After catheter placement, the tip lies in the left common iliac vein. The catheter aspirates normally and is ready for immediate use.  IMPRESSION: Placement of non-tunneled central venous catheter via the left common femoral vein. The catheter tip lies in the left common iliac vein. The catheter is ready for immediate use.   Electronically Signed   By: Irish LackGlenn  Yamagata M.D.   On: 04/06/2013 15:09   Ir Koreas Guide Vasc Access Right  04/06/2013   CLINICAL DATA:  Cardiac arrest and need for additional central venous access. History of failed bilateral dialysis access and status post recent placement of right femoral tunneled hemodialysis catheter.  EXAM: NON-TUNNELED CENTRAL VENOUS CATHETER  PLACEMENT WITH ULTRASOUND AND FLUOROSCOPIC GUIDANCE  FLUOROSCOPY TIME:  12 seconds.  PROCEDURE: Consent was obtained from the patient's husband.  The right groin was prepped with chlorhexidine in a sterile fashion, and a sterile drape was applied covering the operative field. Maximum barrier sterile technique with sterile gowns and gloves were used for the procedure. Local anesthesia was provided with 1% lidocaine.  Ultrasound was used to confirm patency of the left common femoral vein. After creating a small venotomy incision, a 21 gauge needle was advanced into the left common femoral vein under direct, real-time ultrasound guidance. Ultrasound image documentation was performed.  After establishing guidewire access, the venotomy was dilated. A 7 French, 20 cm Arrow triple-lumen central venous catheter was advanced over the wire. The catheter exit site was secured with 0-Prolene retention sutures.  COMPLICATIONS: None.  FINDINGS: After catheter placement, the tip lies in the left common iliac vein. The catheter aspirates normally and is ready for immediate use.  IMPRESSION: Placement of non-tunneled central  venous catheter via the left common femoral vein. The catheter tip lies in the left common iliac vein. The catheter is ready for immediate use.   Electronically Signed   By: Irish Lack M.D.   On: 04/06/2013 15:09   Dg Chest Port 1 View  04/06/2013   CLINICAL DATA:  Cardiac arrest.  EXAM: PORTABLE CHEST - 1 VIEW  COMPARISON:  DG CHEST 1V PORT dated 04/04/2013; DG CHEST 2 VIEW dated 03/25/2013  FINDINGS: Endotracheal tube present with the tip 1.5 cm above the carina. Gastric decompression tube extends into the stomach. Femoral dialysis catheter extends into the right atrium. Lungs show left lower lobe atelectasis. No overt pulmonary edema, pleural fluid and a or pneumothorax is identified. The heart size is stable.  IMPRESSION: Left lower lobe atelectasis.   Electronically Signed   By: Irish Lack M.D.    On: 04/06/2013 09:22   Dg Chest Port 1 View  03/28/2013   CLINICAL DATA:  Endotracheal tube placement.  EXAM: PORTABLE CHEST - 1 VIEW  COMPARISON:  Single view of the chest 04/05/1999.  FINDINGS: Endotracheal tube is in place with the tip 1.2 cm above the carina. The tube could be withdrawn 1-2 cm for better positioning. IVC approach central venous catheter has been withdrawn with tip now in the right atrium. There is cardiomegaly and interstitial edema. No pneumothorax.  IMPRESSION: ET tube tip is 1.2 cm above the carina. The tube could be withdrawn 1- the 2 cm for better positioning.  IVC approach central venous catheter tip is now within the right atrium.  Cardiomegaly and interstitial edema.   Electronically Signed   By: Drusilla Kanner M.D.   On: 04/14/2013 16:34   Ct Portable Head W/o Cm  04/06/2013   CLINICAL DATA:  Acute mental status changes. Cardiac arrest following cardiac catheterization.  EXAM: CT HEAD WITHOUT CONTRAST  TECHNIQUE: Contiguous axial images were obtained from the base of the skull through the vertex without contrast.  COMPARISON:  None  FINDINGS: Portable apparatus was used.  Image quality is reduced.  Premature for age cerebral and cerebellar atrophy. Probable small vessel disease, with hypoattenuation of the deep white matter. No visible acute stroke, intracranial hemorrhage, mass lesion, hydrocephalus, or extra-axial fluid. Preservation of normal gray-white attenuation. Basilar cisterns are not effaced. Dense vascular calcification affecting the carotid and vertebral arteries. Calvarium intact. No acute sinus or mastoid disease.  IMPRESSION: Premature for age cerebral and cerebellar atrophy. Small vessel disease.  There are no features which suggest cerebral anoxia on today's examination. Gray-white junction is preserved, and there is no diffuse cerebral edema.   Electronically Signed   By: Davonna Belling M.D.   On: 04/06/2013 16:50    Medications:  I have reviewed the  patient's current medications. Scheduled: . antiseptic oral rinse  15 mL Mouth Rinse QID  . aspirin  81 mg Per Tube Daily  . [START ON 04/08/2013] calcitRIOL  1 mcg Per Tube Once per day on Mon Wed Fri  . chlorhexidine  15 mL Mouth Rinse BID  . feeding supplement (NEPRO CARB STEADY)  1,000 mL Per Tube Q24H  . hydrocortisone sod succinate (SOLU-CORTEF) inj  50 mg Intravenous Q8H  . insulin aspart  0-15 Units Subcutaneous 6 times per day  . pantoprazole sodium  40 mg Per Tube Q1200    Assessment/Plan: Patient on Fentanyl currently.  Has shown some improvement.  CT of the head performed and reviewed.  It shows no acute changes.  EEG showed no  evidence of seizure activity but was suboptimal due to artifact.    Recommendations: 1.  No further imaging at this time.  Once patient extubated, if she is not at baseline will consider MRI of the brain at that time.   2.  Will continue to follow with you.     LOS: 3 days   Thana Farr, MD [facility] [phone] 04/07/2013  9:45 AM

## 2013-04-08 ENCOUNTER — Other Ambulatory Visit (HOSPITAL_COMMUNITY): Payer: Self-pay

## 2013-04-08 ENCOUNTER — Ambulatory Visit: Payer: Self-pay | Admitting: Surgery

## 2013-04-08 ENCOUNTER — Inpatient Hospital Stay (HOSPITAL_COMMUNITY): Payer: Medicare (Managed Care)

## 2013-04-08 LAB — CBC
HCT: 32.1 % — ABNORMAL LOW (ref 36.0–46.0)
HEMOGLOBIN: 10.4 g/dL — AB (ref 12.0–15.0)
MCH: 27.7 pg (ref 26.0–34.0)
MCHC: 32.4 g/dL (ref 30.0–36.0)
MCV: 85.4 fL (ref 78.0–100.0)
Platelets: 102 10*3/uL — ABNORMAL LOW (ref 150–400)
RBC: 3.76 MIL/uL — AB (ref 3.87–5.11)
RDW: 17.2 % — ABNORMAL HIGH (ref 11.5–15.5)
WBC: 8 10*3/uL (ref 4.0–10.5)

## 2013-04-08 LAB — TRIGLYCERIDES: TRIGLYCERIDES: 53 mg/dL (ref <150)

## 2013-04-08 LAB — RENAL FUNCTION PANEL
ALBUMIN: 3 g/dL — AB (ref 3.5–5.2)
BUN: 34 mg/dL — ABNORMAL HIGH (ref 6–23)
CHLORIDE: 99 meq/L (ref 96–112)
CO2: 25 mEq/L (ref 19–32)
CREATININE: 3.55 mg/dL — AB (ref 0.50–1.10)
Calcium: 8.2 mg/dL — ABNORMAL LOW (ref 8.4–10.5)
GFR calc Af Amer: 15 mL/min — ABNORMAL LOW (ref >90)
GFR calc non Af Amer: 13 mL/min — ABNORMAL LOW (ref >90)
Glucose, Bld: 214 mg/dL — ABNORMAL HIGH (ref 70–99)
Phosphorus: 2.8 mg/dL (ref 2.3–4.6)
Potassium: 4.2 mEq/L (ref 3.7–5.3)
Sodium: 140 mEq/L (ref 137–147)

## 2013-04-08 LAB — GLUCOSE, CAPILLARY
GLUCOSE-CAPILLARY: 154 mg/dL — AB (ref 70–99)
GLUCOSE-CAPILLARY: 164 mg/dL — AB (ref 70–99)
GLUCOSE-CAPILLARY: 177 mg/dL — AB (ref 70–99)
Glucose-Capillary: 155 mg/dL — ABNORMAL HIGH (ref 70–99)
Glucose-Capillary: 168 mg/dL — ABNORMAL HIGH (ref 70–99)
Glucose-Capillary: 127 mg/dL — ABNORMAL HIGH (ref 70–99)

## 2013-04-08 LAB — HEPARIN LEVEL (UNFRACTIONATED): HEPARIN UNFRACTIONATED: 0.41 [IU]/mL (ref 0.30–0.70)

## 2013-04-08 MED ORDER — PROPOFOL 10 MG/ML IV EMUL
0.0000 ug/kg/min | INTRAVENOUS | Status: DC
  Administered 2013-04-08: 5 ug/kg/min via INTRAVENOUS
  Administered 2013-04-09 (×2): 40 ug/kg/min via INTRAVENOUS
  Administered 2013-04-09: 10 ug/kg/min via INTRAVENOUS
  Administered 2013-04-10: 30 ug/kg/min via INTRAVENOUS
  Filled 2013-04-08 (×6): qty 100

## 2013-04-08 MED ORDER — HEPARIN SODIUM (PORCINE) 1000 UNIT/ML IJ SOLN: 7000.0000 [IU] | Freq: Once | INTRAMUSCULAR | Status: DC

## 2013-04-08 MED ORDER — DARBEPOETIN ALFA-POLYSORBATE 25 MCG/0.42ML IJ SOLN
25.0000 ug | INTRAMUSCULAR | Status: DC
  Administered 2013-04-08: 25 ug via INTRAVENOUS
  Filled 2013-04-08 (×2): qty 0.42

## 2013-04-08 MED FILL — Medication: Qty: 1 | Status: AC

## 2013-04-08 NOTE — Progress Notes (Signed)
Name: Faith [name] MRN: [id number] DOB: 08-14-1955   LOS 4 days   ADMISSION DATE:  04/20/2013 CONSULTATION DATE:  04/05/13  REFERRING MD :  Allyson Sabal  CHIEF COMPLAINT:  Cardiac Arrest  BRIEF PATIENT DESCRIPTION:  58 y.o. F admitted on 2/9 for profuse bleeding from right arm graft site.  Graft was clotted, failed thrombectomy.  Discharged from ER and sent home, presented again 2/12 for weakness.  Workup in ED with elevated troponins/NSTEMI.  Pt taken to cath lab 2/13 and suffered cardiac arrest post cath.  PCCM consulted for further management.  SIGNIFICANT EVENTS / STUDIES:  2/9 profuse bleeding from right arm graft site. Failed thrombectomy. Discharged from ER 2/13 Back to ER with weakness/lethargy, found to have NSTEMI/elevated troponins. Cardiac cath: Severe native vessel coronary disease with total occlusion of the proximal to mid LAD, occlusion of the circumflex in its mid-segment, and total occlusion of the proximal RCA. Patent SVG to the right coronary with mild to moderate in-stent restenosis in the proximal segment. Patent saphenous vein graft to the obtuse marginal. Patent left internal mammary graft to the LAD 2/13 Neuro consult: Suspect TME. EEG, MRI recommended 2/14 Extended conversation with pt's husband who acknowledged that pt has been in failing health for many years with declining QOL. Advanced directives and limitations of care discussed. No further ACLS under any circumstances. Cont aggressive support through WE. Once extubated, no re-intubation. 2/14 CT head: NAD 2/14 EEG: This is a technically difficult record secondary to the predominance of muscle and movement artifact. On only rare occasions can a background activity be evaluated. At these times the background is slow and no epileptiform activity is noted.  2/14 Echo: LVEF 30-35%. Diffuse HK. Grade 2 diastolic dysfunction. RV moderately dilated. PASP estimate 54 mmHg 2/15 Weaning vasopressors. More responsive. + F/C  intermittently   LINES / TUBES: R fem HD cath >>  ETT 12/13 >>> Left radial A-line 2/13 >>  L femoral TLC 2/13 >>   CULTURES:   ANTIBIOTICS:    SUBJECTIVE:   04/08/13: Weaning only on higher PSV.  On fentanyl gtt: opened eyes and maybe followed commands x 1 but also agitated. Not on pressors (off levophed since 5am)  VITAL SIGNS: Temp:  [97.4 F (36.3 C)-99.4 F (37.4 C)] 99 F (37.2 C) (02/16 0000) Pulse Rate:  [25-102] 69 (02/16 0909) Resp:  [16-24] 22 (02/16 0909) BP: (108)/(50) 108/50 mmHg (02/16 0909) SpO2:  [87 %-100 %] 95 % (02/16 0909) Arterial Line BP: (81-136)/(39-56) 125/56 mmHg (02/16 0700) FiO2 (%):  [30 %] 30 % (02/16 0909) HEMODYNAMICS: CVP:  [19 mmHg] 19 mmHg VENTILATOR SETTINGS: Vent Mode:  [-] CPAP FiO2 (%):  [30 %] 30 % Set Rate:  [18 bmp] 18 bmp Vt Set:  [440 mL] 440 mL PEEP:  [5 cmH20] 5 cmH20 Pressure Support:  [15 cmH20] 15 cmH20 Plateau Pressure:  [17 cmH20-25 cmH20] 17 cmH20 INTAKE / OUTPUT: Intake/Output     02/15 0701 - 02/16 0700 02/16 0701 - 02/17 0700   I.V. (mL/kg) 1305.7 (16.5)    NG/GT 380    IV Piggyback 150    Total Intake(mL/kg) 1835.7 (23.1)    Other 175    Total Output 175     Net +1660.7            PHYSICAL EXAMINATION: General:  Chronically ill, minimally responsive Neuro:  Not F/C HEENT: WNL Cardiovascular: Reg, no M Lungs:  CTA bilaterally Abdomen:  BS x 4, soft, NT/ND. Musculoskeletal:  Right  BKA. No edema.   PULMONARY  Recent Labs Lab 04/03/2013 1555 03/30/2013 1645 04/20/2013 1927 04/02/2013 1937 03/28/2013 1948 03/27/2013 2110 04/06/13 0401  PHART 7.145* 7.228* 7.336*  --   --  7.202* 7.473*  PCO2ART 37.9 34.6* 33.0*  --   --  33.6* 32.8*  PO2ART 101.0* 301.0* 77.0*  --   --  125.0* 143.0*  HCO3 13.1* 14.5* 17.7*  --   --  13.3* 24.1*  TCO2 14 16 19 17   --  14 25  O2SAT 96.0 100.0 95.0  --  78.8 98.0 99.0    CBC  Recent Labs Lab 04/06/13 0400 04/07/13 0337 04/08/13 0400  HGB 11.1* 11.2* 10.4*   HCT 34.1* 34.4* 32.1*  WBC 14.8* 13.3* 8.0  PLT PLATELET CLUMPS NOTED ON SMEAR, COUNT APPEARS DECREASED 122* 102*    COAGULATION  Recent Labs Lab 05/05/13 1413 04/20/2013 0439 04/16/2013 0825 04/08/2013 1600  INR 2.29* 6.27* 1.84* 1.89*    CARDIAC   Recent Labs Lab 05/05/13 1446 05/05/13 2245 04/12/2013 0825 04/17/2013 1245  TROPONINI 7.91* 14.40* 10.26* 8.78*    Recent Labs Lab 05/05/13 2245  PROBNP 44743.0*     CHEMISTRY  Recent Labs Lab 04/09/2013 0439  04/16/2013 1600 04/12/2013 1900 04/18/2013 1937 04/06/13 0400 04/06/13 1600 04/07/13 0337  NA 138  140  < > 135* 141 140 139 138 139  K 3.2*  3.2*  < > 4.4 4.6 4.7 4.1 3.6* 4.5  CL 95*  96  < > 94* 93* 100 88* 91* 98  CO2 26  24  --  11* 20  --  20 27 24   GLUCOSE 59*  60*  < > 210* 176* 170* 246* 165* 99  BUN 18  18  < > 23 25* 24* 25* 21 16  CREATININE 4.06*  3.98*  < > 4.53* 4.89* 4.60* 3.50* 2.67* 2.04*  CALCIUM 8.2*  8.0*  --  8.8 8.0*  --  7.4* 7.6* 8.0*  MG  --   --   --   --   --  2.2  --  2.5  PHOS 3.0  --   --   --   --  3.3 1.8* 2.6  < > = values in this interval not displayed. Estimated Creatinine Clearance: 30.2 ml/min (by C-G formula based on Cr of 2.04).   LIVER  Recent Labs Lab 05/05/13 1413 04/09/2013 0439 04/07/2013 0825 04/01/2013 1600 04/06/13 0400 04/06/13 1600 04/07/13 0337  AST  --   --   --  295*  --   --   --   ALT  --   --   --  41*  --   --   --   ALKPHOS  --   --   --  231*  --   --   --   BILITOT  --   --   --  0.6  --   --   --   PROT  --   --   --  7.6  --   --   --   ALBUMIN  --  3.6  --  3.4* 3.5 3.4* 3.5  INR 2.29* 6.27* 1.84* 1.89*  --   --   --      INFECTIOUS  Recent Labs Lab 04/06/2013 1900 03/27/2013 1940 04/06/13 0400 04/07/13 0337  LATICACIDVEN 10.8*  --   --   --   PROCALCITON  --  2.71 3.18 4.18     ENDOCRINE CBG (last 3)  Recent Labs  04/07/13 1937 04/07/13 2346 04/08/13 0415  GLUCAP 155* 164* 154*         IMAGING x48h  Dg Chest  Port 1 View  04/08/2013   CLINICAL DATA:  Followup respiratory failure.  EXAM: PORTABLE CHEST - 1 VIEW  COMPARISON:  DG CHEST 1V PORT dated 04/06/2013; DG CHEST 1V PORT dated 04/18/2013  FINDINGS: Prior CABG. Endotracheal tube has been slightly retracted. The tip is now approximately 4 cm above the carina. Cardiomegaly with vascular congestion and bibasilar atelectasis.  IMPRESSION: Slight retraction of the endotracheal tube, now 4 cm above the carina.  Continued vascular congestion and bibasilar atelectasis.   Electronically Signed   By: Charlett Nose M.D.   On: 04/08/2013 06:52   Ct Portable Head W/o Cm  04/06/2013   CLINICAL DATA:  Acute mental status changes. Cardiac arrest following cardiac catheterization.  EXAM: CT HEAD WITHOUT CONTRAST  TECHNIQUE: Contiguous axial images were obtained from the base of the skull through the vertex without contrast.  COMPARISON:  None  FINDINGS: Portable apparatus was used.  Image quality is reduced.  Premature for age cerebral and cerebellar atrophy. Probable small vessel disease, with hypoattenuation of the deep white matter. No visible acute stroke, intracranial hemorrhage, mass lesion, hydrocephalus, or extra-axial fluid. Preservation of normal gray-white attenuation. Basilar cisterns are not effaced. Dense vascular calcification affecting the carotid and vertebral arteries. Calvarium intact. No acute sinus or mastoid disease.  IMPRESSION: Premature for age cerebral and cerebellar atrophy. Small vessel disease.  There are no features which suggest cerebral anoxia on today's examination. Gray-white junction is preserved, and there is no diffuse cerebral edema.   Electronically Signed   By: Davonna Belling M.D.   On: 04/06/2013 16:50       ASSESSMENT / PLAN:  PULMONARY A: Acute Respiratory Failure  216/15: Does not meet extubation criteria due to encephalopathy and higher PSV need  P:   SBT as tolerated. No extubation 04/08/13 Vent settings reviewed Cont vent  bundle Daily SBT Once optimized, extubate with no re-intubation if fails (per discussion with husband 2/14)  CARDIOVASCULAR A:  S/p Cardiac Arrest Elevated Troponins CAD  2/16: off presssors P:  Further eval and mgmt per Cards   RENAL A: ESRD Metabolic Acidosis, resolved P:   HD 04/08/13 per renal  GASTROINTESTINAL A:  No acute issues. P:   SUP:  Pantoprazole. Cont TFs @ current rate  HEMATOLOGIC A:  Mild anemia without acute blood loss Mild thrombocytopenia P:  DVT px: full dose heparin CBC intermittently PRBC for hgb < 8gm% due to cardiac arrest  INFECTIOUS A:   No acute issues. P:   Micro and abx as above  ENDOCRINE A:  DM II, controlled P:   Lantus DC'd  Cont SSI  NEUROLOGIC A:  Acute encephalopathy, improving Chronic cognitive impairment, NOS Tardive Dyskinesia  04/08/13: agitated.   P:   Change fent gtt to diprivan gtt + fent prn Minimize sedation Daily WUA   GLOBAL 2//16/15: Husband and pastor updated.    The patient is critically ill with multiple organ systems failure and requires high complexity decision making for assessment and support, frequent evaluation and titration of therapies, application of advanced monitoring technologies and extensive interpretation of multiple databases.   Critical Care Time devoted to patient care services described in this note is  35  Minutes.  Dr. Kalman Shan, M.D., [facility].C.P Pulmonary and Critical Care Medicine Staff Physician [facility] Pager: [phone]  [phone], If no answer or between  15:00h - 7:00h: call [phone]  04/08/2013 9:20 AM

## 2013-04-08 NOTE — Progress Notes (Signed)
NUTRITION FOLLOW-UP  DOCUMENTATION CODES Per approved criteria  -Obesity Unspecified   INTERVENTION: 1.  Enteral nutrition; If continued Nepro warranted by MD, consider decrease to 10 mL/hr and adding Prostat 60 mL (two packets) 4 times daily to provide 1232 kcal, 139g protein, 174 mL free water.   RD to continue to follow nutrition care plan.  NUTRITION DIAGNOSIS: Increased nutrient needs related to ESRD, on HD as evidenced by estimated nutrition needs. Ongoing.  Goal: Enteral nutrition to provide 60-70% of estimated calorie needs (22-25 kcals/kg ideal body weight) and 100% of estimated protein needs, based on ASPEN guidelines for permissive underfeeding in critically ill obese individuals.  Monitor:  weight trends, lab trends, I/O's, initiation of nutrition support, respiratory status  ASSESSMENT: Patient with PMH of CAD with CABG, ESRD, DM2, CVA and AKA; presented to ER with weakness after placement of new AV graft and removal of old graft, some bleeding post procedure.    Underwent L heart cath, angiography, L ventriculography, bypass graft angiography on 2/13. Patient suffered cardiac arrest s/p cath and taken to ICU.  Patient is currently intubated on ventilator support.  MV: 7.8 L/min Temp (24hrs), Avg:98.5 F (36.9 C), Min:97.4 F (36.3 C), Max:99.4 F (37.4 C)  Renal following 2/2 ESRD, CRRT initiated 2/13. Remains on CRRT.  Neuro consulted 2/2 AMS.  Pt opens eyes but remains agitated.   Pt currently receiving Nepro at 20 mL/hr which provides 864 kcal, 38g protein, and 348 mL free water.    Height: Ht Readings from Last 1 Encounters:  2013-03-17 5\' 4"  (1.626 m)    Weight: Wt Readings from Last 1 Encounters:  04/08/13 180 lb 8.9 oz (81.9 kg)  Admit wt 169 lb   BMI:  31.6 kg/m2 -- adjusted for AKA (using adjusted weight)  Estimated Nutritional Needs: Kcal: 1434 Underfeeding Kcal Goal: 1079 - 1227 kcal Protein: 120 - 140 gm Fluid: 1.2 liters  Skin: R arm  incision  Diet Order:  NPO   Intake/Output Summary (Last 24 hours) at 04/08/13 1518 Last data filed at 04/08/13 1459  Gross per 24 hour  Intake 1544.46 ml  Output   2542 ml  Net -997.54 ml   Last BM: PTA  Labs:   Recent Labs Lab 04/06/13 0400 04/06/13 1600 04/07/13 0337 04/08/13 1048  NA 139 138 139 140  K 4.1 3.6* 4.5 4.2  CL 88* 91* 98 99  CO2 20 27 24 25   BUN 25* 21 16 34*  CREATININE 3.50* 2.67* 2.04* 3.55*  CALCIUM 7.4* 7.6* 8.0* 8.2*  MG 2.2  --  2.5  --   PHOS 3.3 1.8* 2.6 2.8  GLUCOSE 246* 165* 99 214*    CBG (last 3)   Recent Labs  04/07/13 2346 04/08/13 0415 04/08/13 1148  GLUCAP 164* 154* 168*    Scheduled Meds: . antiseptic oral rinse  15 mL Mouth Rinse QID  . aspirin  81 mg Per Tube Daily  . calcitRIOL  1 mcg Per Tube Once per day on Mon Wed Fri  . chlorhexidine  15 mL Mouth Rinse BID  . darbepoetin (ARANESP) injection - DIALYSIS  25 mcg Intravenous Q Mon-HD  . feeding supplement (NEPRO CARB STEADY)  1,000 mL Per Tube Q24H  . heparin  7,000 Units Intravenous Once  . hydrocortisone sod succinate (SOLU-CORTEF) inj  50 mg Intravenous Q8H  . insulin aspart  0-15 Units Subcutaneous 6 times per day  . pantoprazole sodium  40 mg Per Tube Q1200  . piperacillin-tazobactam (ZOSYN)  IV  2.25 g Intravenous 3 times per day    Continuous Infusions: . sodium chloride 10 mL/hr (04/08/13 0800)  . sodium chloride 10 mL/hr (04/08/13 0800)  . heparin 1,000 Units/hr (04/08/13 0800)  . dialysis replacement fluid (prismasate) 400 mL/hr at 04/06/13 2351  . propofol      Loyce Dys, MS RD LDN Clinical Inpatient Dietitian Pager: [phone] Weekend/After hours pager: [phone]

## 2013-04-08 NOTE — Progress Notes (Signed)
ANTICOAGULATION CONSULT NOTE - Follow Up Consult  Pharmacy Consult for heparin Indication: NSTEMI  Labs:  Recent Labs  February 12, 2014 1245  February 12, 2014 1600  04/06/13 0400 04/06/13 1500 04/06/13 1600 04/07/13 0337 04/08/13 0400  HGB  --   < > 11.4*  < > 11.1*  --   --  11.2* 10.4*  HCT  --   < > 36.7  < > 34.1*  --   --  34.4* 32.1*  PLT  --   --  111*  < > PLATELET CLUMPS NOTED ON SMEAR, COUNT APPEARS DECREASED  --   --  122* 102*  APTT  --   --   --   --  90*  --   --  178*  --   LABPROT  --   --  21.1*  --   --   --   --   --   --   INR  --   --  1.89*  --   --   --   --   --   --   HEPARINUNFRC  --   < > <0.10*  --  0.11* 0.30  --  0.52 0.41  CREATININE  --   < > 4.53*  < > 3.50*  --  2.67* 2.04*  --   TROPONINI 8.78*  --   --   --   --   --   --   --   --   < > = values in this interval not displayed.   Assessment: 58yo female on heparin after resumed post-cath on 2/13 which showed EF 60%, severe CAD with total occlusion of mid LAD, LCx, and proximal RCA, patent SVG to RCA with mild in-stent restenosis, patent SVG to OM, patent LIMA to LAD. Patient coded post-cath and has been intubated since then. Pharmacy is consulted to manage her anticoagulation with heparin.  HL remains therapeutic at 0.41. H/H, plt low but stable with no reported s/s bleeding.   Goal of Therapy:  Heparin level 0.3-0.7 units/ml   Plan:  - Continue heparin at 1000 units/hr - Daily HL, CBC - Monitor for s/s bleeding  Traivon Morrical C. Ely Spragg, PharmD Clinical Pharmacist-Resident Pager: [phone] Pharmacy: [phone] 04/08/2013 11:09 AM

## 2013-04-08 NOTE — Progress Notes (Signed)
Subjective: Patient on less sedation this morning.  Agitated and does not follow commands but moving all extremities.    Objective: Current vital signs: BP 103/44  Pulse 56  Temp(Src) 98.1 F (36.7 C) (Oral)  Resp 18  Ht 5\' 4"  (1.626 m)  Wt 81.9 kg (180 lb 8.9 oz)  BMI 30.98 kg/m2  SpO2 100% Vital signs in last 24 hours: Temp:  [97.4 F (36.3 C)-99.4 F (37.4 C)] 98.1 F (36.7 C) (02/16 1200) Pulse Rate:  [25-102] 56 (02/16 1400) Resp:  [16-31] 18 (02/16 1400) BP: (91-137)/(42-58) 103/44 mmHg (02/16 1400) SpO2:  [94 %-100 %] 100 % (02/16 1400) Arterial Line BP: (90-146)/(41-64) 90/41 mmHg (02/16 1200) FiO2 (%):  [30 %] 30 % (02/16 1104) Weight:  [81.9 kg (180 lb 8.9 oz)] 81.9 kg (180 lb 8.9 oz) (02/16 1045)  Intake/Output from previous day: 02/15 0701 - 02/16 0700 In: 1835.7 [I.V.:1305.7; NG/GT:380; IV Piggyback:150] Out: 175  Intake/Output this shift: Total I/O In: 185.5 [I.V.:125.5; NG/GT:60] Out: -  Nutritional status:    Neurologic Exam: Mental Status:  Eyes open but does not appear to focus on examiner. Does not follow commands. No attempts at verbalizations noted. Reaching for tube. Cranial Nerves:  II: patient does not respond confrontation bilaterally, pupils right 3 mm, left 2 mm,and fixed bilaterally  III,IV,VI: doll's response absent bilaterally.  V,VII: corneal reflex weak bilaterally  VIII: grossly intact  IX,X: gag reflex reduced, XI: trapezius strength unable to test bilaterally  XII: tongue strength unable to test  Motor:  Moves both upper extremities against gravity Deep Tendon Reflexes:  Absent throughout.  Plantars:  mute on the left. Right BKA  Cerebellar:  Unable to perform   Lab Results: Basic Metabolic Panel:  Recent Labs Lab 04/02/2013 0439  04/12/2013 1900 04/20/2013 1937 04/06/13 0400 04/06/13 1600 04/07/13 0337 04/08/13 1048  NA 138  140  < > 141 140 139 138 139 140  K 3.2*  3.2*  < > 4.6 4.7 4.1 3.6* 4.5 4.2  CL 95*  96   < > 93* 100 88* 91* 98 99  CO2 26  24  < > 20  --  20 27 24 25   GLUCOSE 59*  60*  < > 176* 170* 246* 165* 99 214*  BUN 18  18  < > 25* 24* 25* 21 16 34*  CREATININE 4.06*  3.98*  < > 4.89* 4.60* 3.50* 2.67* 2.04* 3.55*  CALCIUM 8.2*  8.0*  < > 8.0*  --  7.4* 7.6* 8.0* 8.2*  MG  --   --   --   --  2.2  --  2.5  --   PHOS 3.0  --   --   --  3.3 1.8* 2.6 2.8  < > = values in this interval not displayed.  Liver Function Tests:  Recent Labs Lab 04/04/2013 1600 04/06/13 0400 04/06/13 1600 04/07/13 0337 04/08/13 1048  AST 295*  --   --   --   --   ALT 41*  --   --   --   --   ALKPHOS 231*  --   --   --   --   BILITOT 0.6  --   --   --   --   PROT 7.6  --   --   --   --   ALBUMIN 3.4* 3.5 3.4* 3.5 3.0*   No results found for this basename: LIPASE, AMYLASE,  in the last 168 hours No results found  for this basename: AMMONIA,  in the last 168 hours  CBC:  Recent Labs Lab 03/31/2013 1222  2013-05-01 1600 05/01/2013 1900 2013/05/01 1937 04/06/13 0400 04/07/13 0337 04/08/13 0400  WBC 10.2  < > 12.6* 14.1*  --  14.8* 13.3* 8.0  NEUTROABS 7.4  --   --   --   --   --   --   --   HGB 11.4*  < > 11.4* 10.0* 12.2 11.1* 11.2* 10.4*  HCT 35.4*  < > 36.7 32.3* 36.0 34.1* 34.4* 32.1*  MCV 85.3  < > 87.8 87.5  --  84.4 84.3 85.4  PLT 151  < > 111* 124*  --  PLATELET CLUMPS NOTED ON SMEAR, COUNT APPEARS DECREASED 122* 102*  < > = values in this interval not displayed.  Cardiac Enzymes:  Recent Labs Lab 03/26/2013 1446 04/02/2013 2245 05-01-2013 0825 05/01/13 1245  CKTOTAL 350*  --   --   --   CKMB 20.3*  --   --   --   TROPONINI 7.91* 14.40* 10.26* 8.78*    Lipid Panel:  Recent Labs Lab 05-01-2013 0439 04/08/13 1035  CHOL 118  --   TRIG 108 53  HDL 42  --   CHOLHDL 2.8  --   VLDL 22  --   LDLCALC 54  --     CBG:  Recent Labs Lab 04/07/13 1612 04/07/13 1937 04/07/13 2346 04/08/13 0415 04/08/13 1148  GLUCAP 140* 155* 164* 154* 168*    Microbiology: Results for orders  placed during the hospital encounter of 04/07/2013  MRSA PCR SCREENING     Status: None   Collection Time    04/10/2013  7:00 PM      Result Value Ref Range Status   MRSA by PCR NEGATIVE  NEGATIVE Final   Comment:            The GeneXpert MRSA Assay (FDA     approved for NASAL specimens     only), is one component of a     comprehensive MRSA colonization     surveillance program. It is not     intended to diagnose MRSA     infection nor to guide or     monitor treatment for     MRSA infections.  CULTURE, BLOOD (ROUTINE X 2)     Status: None   Collection Time    2013/05/01 12:59 PM      Result Value Ref Range Status   Specimen Description BLOOD LEFT HAND   Final   Special Requests BOTTLES DRAWN AEROBIC ONLY 4CC   Final   Culture  Setup Time     Final   Value: 05/01/13 16:40     Performed at [facility]   Culture     Final   Value:        BLOOD CULTURE RECEIVED NO GROWTH TO DATE CULTURE WILL BE HELD FOR 5 DAYS BEFORE ISSUING A FINAL NEGATIVE REPORT     Performed at [facility]   Report Status PENDING   Incomplete  CULTURE, BLOOD (ROUTINE X 2)     Status: None   Collection Time    05/01/13  1:07 PM      Result Value Ref Range Status   Specimen Description BLOOD LEFT HAND   Final   Special Requests BOTTLES DRAWN AEROBIC ONLY 2CC   Final   Culture  Setup Time     Final   Value: May 01, 2013 16:40  Performed at [facility]   Culture     Final   Value:        BLOOD CULTURE RECEIVED NO GROWTH TO DATE CULTURE WILL BE HELD FOR 5 DAYS BEFORE ISSUING A FINAL NEGATIVE REPORT     Performed at [facility]   Report Status PENDING   Incomplete    Coagulation Studies:  Recent Labs  January 25, 2014 1600  LABPROT 21.1*  INR 1.89*    Imaging: Dg Chest Port 1 View  04/08/2013   CLINICAL DATA:  Followup respiratory failure.  EXAM: PORTABLE CHEST - 1 VIEW  COMPARISON:  DG CHEST 1V PORT dated 04/06/2013; DG CHEST 1V PORT dated 08/03/2013  FINDINGS: Prior CABG.  Endotracheal tube has been slightly retracted. The tip is now approximately 4 cm above the carina. Cardiomegaly with vascular congestion and bibasilar atelectasis.  IMPRESSION: Slight retraction of the endotracheal tube, now 4 cm above the carina.  Continued vascular congestion and bibasilar atelectasis.   Electronically Signed   By: Charlett NoseKevin  Dover M.D.   On: 04/08/2013 06:52   Ct Portable Head W/o Cm  04/06/2013   CLINICAL DATA:  Acute mental status changes. Cardiac arrest following cardiac catheterization.  EXAM: CT HEAD WITHOUT CONTRAST  TECHNIQUE: Contiguous axial images were obtained from the base of the skull through the vertex without contrast.  COMPARISON:  None  FINDINGS: Portable apparatus was used.  Image quality is reduced.  Premature for age cerebral and cerebellar atrophy. Probable small vessel disease, with hypoattenuation of the deep white matter. No visible acute stroke, intracranial hemorrhage, mass lesion, hydrocephalus, or extra-axial fluid. Preservation of normal gray-white attenuation. Basilar cisterns are not effaced. Dense vascular calcification affecting the carotid and vertebral arteries. Calvarium intact. No acute sinus or mastoid disease.  IMPRESSION: Premature for age cerebral and cerebellar atrophy. Small vessel disease.  There are no features which suggest cerebral anoxia on today's examination. Gray-white junction is preserved, and there is no diffuse cerebral edema.   Electronically Signed   By: Davonna BellingJohn  Curnes M.D.   On: 04/06/2013 16:50    Medications:  I have reviewed the patient's current medications. Scheduled: . antiseptic oral rinse  15 mL Mouth Rinse QID  . aspirin  81 mg Per Tube Daily  . calcitRIOL  1 mcg Per Tube Once per day on Mon Wed Fri  . chlorhexidine  15 mL Mouth Rinse BID  . darbepoetin (ARANESP) injection - DIALYSIS  25 mcg Intravenous Q Mon-HD  . feeding supplement (NEPRO CARB STEADY)  1,000 mL Per Tube Q24H  . hydrocortisone sod succinate (SOLU-CORTEF)  inj  50 mg Intravenous Q8H  . insulin aspart  0-15 Units Subcutaneous 6 times per day  . pantoprazole sodium  40 mg Per Tube Q1200  . piperacillin-tazobactam (ZOSYN)  IV  2.25 g Intravenous 3 times per day    Assessment/Plan: Patient has yet to regain baseline mental status. Although may still be related to a metabolic encephalopathy, can not rule out the possibility of stroke.  Once extubated and stable would perform MRI if not at baseline.    Recommendations: 1.  MRI once stable.     LOS: 4 days   Thana FarrLeslie Nautia Lem, MD [facility] [phone] 04/08/2013  2:05 PM

## 2013-04-08 NOTE — Progress Notes (Signed)
[facility] Progress Note  Subjective:   Sedated/intubated. HD in progress. Maintaining BP without pressors.  Objective Filed Vitals:   04/08/13 1057 04/08/13 1104 04/08/13 1105 04/08/13 1115  BP: 127/58  117/53   Pulse: 62 59 63 63  Temp:      TempSrc:      Resp: 18 18 18 18   Height:      Weight:      SpO2: 100% 100% 100% 100%   Physical Exam General: Opens eyes to voice, agitated when aroused Heart: RRR Lungs: CTA bilat/ on vent Abdomen: Soft, NT , + BS Extremities: R AKA, Trace pedal edema on left Dialysis Access: R fem TDC. RUA AVG + bruit/ trace ecchymosis around bandages s/p revision  Dialysis: MWF [facility]  3.5h 76kg 2K/2.0Bath Heparin 5000 R fem TDC (new R upper arm AVG 2/10)  Calcitriol po 1ug tiw Epo none Venofer none  Last labs: Hb 11.6 tsat 21% ferritin 1621 phos 2.8 pth 231   Assessment/Plan: 1. NSTEMI / hx CABG - patent grafts by cath 2/13  2. Cardiac arrest - post cath, CPR ~5 min, intubated  3. Hypotension - Maintaining soft SBPs off pressors. Most recenly 122/54. Afebrile, leukocytosis resolving. Blood cx's negative thus far. On empiric Zosyn pending final cx results. 4. ESRD - Now off CRRT. HD today as separate  5. Volume - Trace LLE edema. Pulm vasc congestion on CXR. UF goal 3L today 6. Anemia Hgb 10.4 < 11. Start Aranesp 25. Last Tsat 21% with good Ferritin. No IV Fe for now. 7. 2HPTH -  Ca 8 (8.4 corrected.) Continue po Vit D per tube. Resume binders and Sensipar once extubated/eating 8. HD access - S/P removal degenerated RUA AVG and placement new AVG at same site 2/10 (Dr Darrick PennaFields). Using R fem TDC   Scot JunKaren E. Broadus JohnWarren, PA-C [facility] Pager [phone] 04/08/2013,11:44 AM  LOS: 4 days   Renal Attending, As above with stable hemodynamics and weaning on vent.  Will support with IHD.   Additional Objective Labs: Basic Metabolic Panel:  Recent Labs Lab 04/06/13 0400 04/06/13 1600 04/07/13 0337  NA 139 138 139  K 4.1  3.6* 4.5  CL 88* 91* 98  CO2 20 27 24   GLUCOSE 246* 165* 99  BUN 25* 21 16  CREATININE 3.50* 2.67* 2.04*  CALCIUM 7.4* 7.6* 8.0*  PHOS 3.3 1.8* 2.6   Liver Function Tests:  Recent Labs Lab 04/03/2013 1600 04/06/13 0400 04/06/13 1600 04/07/13 0337  AST 295*  --   --   --   ALT 41*  --   --   --   ALKPHOS 231*  --   --   --   BILITOT 0.6  --   --   --   PROT 7.6  --   --   --   ALBUMIN 3.4* 3.5 3.4* 3.5   No results found for this basename: LIPASE, AMYLASE,  in the last 168 hours CBC:  Recent Labs Lab 03/24/2013 1222  04/10/2013 1600 04/15/2013 1900  04/06/13 0400 04/07/13 0337 04/08/13 0400  WBC 10.2  < > 12.6* 14.1*  --  14.8* 13.3* 8.0  NEUTROABS 7.4  --   --   --   --   --   --   --   HGB 11.4*  < > 11.4* 10.0*  < > 11.1* 11.2* 10.4*  HCT 35.4*  < > 36.7 32.3*  < > 34.1* 34.4* 32.1*  MCV 85.3  < > 87.8 87.5  --  84.4 84.3 85.4  PLT 151  < > 111* 124*  --  PLATELET CLUMPS NOTED ON SMEAR, COUNT APPEARS DECREASED 122* 102*  < > = values in this interval not displayed. Blood Culture    Component Value Date/Time   SDES BLOOD LEFT HAND 04/12/2013 1307   SPECREQUEST BOTTLES DRAWN AEROBIC ONLY [facility] 03/25/2013 1307   CULT  Value:        BLOOD CULTURE RECEIVED NO GROWTH TO DATE CULTURE WILL BE HELD FOR 5 DAYS BEFORE ISSUING A FINAL NEGATIVE REPORT Performed at [facility] 03/30/2013 1307   REPTSTATUS PENDING 04/15/2013 1307    Cardiac Enzymes:  Recent Labs Lab 04/12/2013 1446 04/02/2013 2245 04/03/2013 0825 04/10/2013 1245  CKTOTAL 350*  --   --   --   CKMB 20.3*  --   --   --   TROPONINI 7.91* 14.40* 10.26* 8.78*   CBG:  Recent Labs Lab 04/07/13 1139 04/07/13 1612 04/07/13 1937 04/07/13 2346 04/08/13 0415  GLUCAP 106* 140* 155* 164* 154*   Studies/Results: Dg Chest Port 1 View  04/08/2013   CLINICAL DATA:  Followup respiratory failure.  EXAM: PORTABLE CHEST - 1 VIEW  COMPARISON:  DG CHEST 1V PORT dated 04/06/2013; DG CHEST 1V PORT dated 04/05/2013  FINDINGS:  Prior CABG. Endotracheal tube has been slightly retracted. The tip is now approximately 4 cm above the carina. Cardiomegaly with vascular congestion and bibasilar atelectasis.  IMPRESSION: Slight retraction of the endotracheal tube, now 4 cm above the carina.  Continued vascular congestion and bibasilar atelectasis.   Electronically Signed   By: Charlett Nose M.D.   On: 04/08/2013 06:52   Ct Portable Head W/o Cm  04/06/2013   CLINICAL DATA:  Acute mental status changes. Cardiac arrest following cardiac catheterization.  EXAM: CT HEAD WITHOUT CONTRAST  TECHNIQUE: Contiguous axial images were obtained from the base of the skull through the vertex without contrast.  COMPARISON:  None  FINDINGS: Portable apparatus was used.  Image quality is reduced.  Premature for age cerebral and cerebellar atrophy. Probable small vessel disease, with hypoattenuation of the deep white matter. No visible acute stroke, intracranial hemorrhage, mass lesion, hydrocephalus, or extra-axial fluid. Preservation of normal gray-white attenuation. Basilar cisterns are not effaced. Dense vascular calcification affecting the carotid and vertebral arteries. Calvarium intact. No acute sinus or mastoid disease.  IMPRESSION: Premature for age cerebral and cerebellar atrophy. Small vessel disease.  There are no features which suggest cerebral anoxia on today's examination. Gray-white junction is preserved, and there is no diffuse cerebral edema.   Electronically Signed   By: Davonna Belling M.D.   On: 04/06/2013 16:50   Medications: . sodium chloride 10 mL/hr (04/08/13 0800)  . sodium chloride 10 mL/hr (04/08/13 0800)  . heparin 1,000 Units/hr (04/08/13 0800)  . dialysis replacement fluid (prismasate) 400 mL/hr at 04/06/13 2351  . propofol     . antiseptic oral rinse  15 mL Mouth Rinse QID  . aspirin  81 mg Per Tube Daily  . calcitRIOL  1 mcg Per Tube Once per day on Mon Wed Fri  . chlorhexidine  15 mL Mouth Rinse BID  . darbepoetin  (ARANESP) injection - DIALYSIS  25 mcg Intravenous Q Mon-HD  . feeding supplement (NEPRO CARB STEADY)  1,000 mL Per Tube Q24H  . hydrocortisone sod succinate (SOLU-CORTEF) inj  50 mg Intravenous Q8H  . insulin aspart  0-15 Units Subcutaneous 6 times per day  . pantoprazole sodium  40 mg Per Tube Q1200  .  piperacillin-tazobactam (ZOSYN)  IV  2.25 g Intravenous 3 times per day

## 2013-04-09 LAB — GLUCOSE, CAPILLARY
GLUCOSE-CAPILLARY: 188 mg/dL — AB (ref 70–99)
GLUCOSE-CAPILLARY: 147 mg/dL — AB (ref 70–99)
GLUCOSE-CAPILLARY: 133 mg/dL — AB (ref 70–99)
Glucose-Capillary: 156 mg/dL — ABNORMAL HIGH (ref 70–99)
Glucose-Capillary: 187 mg/dL — ABNORMAL HIGH (ref 70–99)
Glucose-Capillary: 199 mg/dL — ABNORMAL HIGH (ref 70–99)
Glucose-Capillary: 231 mg/dL — ABNORMAL HIGH (ref 70–99)

## 2013-04-09 LAB — CBC
HCT: 30.8 % — ABNORMAL LOW (ref 36.0–46.0)
HEMOGLOBIN: 9.8 g/dL — AB (ref 12.0–15.0)
MCH: 27.4 pg (ref 26.0–34.0)
MCHC: 31.8 g/dL (ref 30.0–36.0)
MCV: 86 fL (ref 78.0–100.0)
Platelets: 104 10*3/uL — ABNORMAL LOW (ref 150–400)
RBC: 3.58 MIL/uL — ABNORMAL LOW (ref 3.87–5.11)
RDW: 17.7 % — ABNORMAL HIGH (ref 11.5–15.5)
WBC: 9 10*3/uL (ref 4.0–10.5)

## 2013-04-09 LAB — HEPARIN LEVEL (UNFRACTIONATED)
Heparin Unfractionated: 0.28 IU/mL — ABNORMAL LOW (ref 0.30–0.70)
Heparin Unfractionated: 0.26 IU/mL — ABNORMAL LOW (ref 0.30–0.70)

## 2013-04-09 MED ORDER — LIDOCAINE-PRILOCAINE 2.5-2.5 % EX CREA
1.0000 "application " | TOPICAL_CREAM | CUTANEOUS | Status: DC | PRN
  Filled 2013-04-09: qty 5

## 2013-04-09 MED ORDER — HEPARIN SODIUM (PORCINE) 1000 UNIT/ML DIALYSIS: 20.0000 [IU]/kg | INTRAMUSCULAR | Status: DC | PRN

## 2013-04-09 MED ORDER — ALTEPLASE 2 MG IJ SOLR
2.0000 mg | Freq: Once | INTRAMUSCULAR | Status: AC | PRN
  Filled 2013-04-09: qty 2

## 2013-04-09 MED ORDER — HEPARIN SODIUM (PORCINE) 1000 UNIT/ML DIALYSIS
1000.0000 [IU] | INTRAMUSCULAR | Status: DC | PRN
  Administered 2013-04-10: 7000 [IU] via INTRAVENOUS_CENTRAL

## 2013-04-09 MED ORDER — PENTAFLUOROPROP-TETRAFLUOROETH EX AERO
1.0000 | INHALATION_SPRAY | CUTANEOUS | Status: DC | PRN
Start: 2013-04-09 — End: 2013-04-11

## 2013-04-09 MED ORDER — NEPRO/CARBSTEADY PO LIQD
237.0000 mL | ORAL | Status: DC | PRN
  Filled 2013-04-09: qty 237

## 2013-04-09 MED ORDER — SODIUM CHLORIDE 0.9 % IV SOLN: 100.0000 mL | INTRAVENOUS | Status: DC | PRN

## 2013-04-09 MED ORDER — LIDOCAINE HCL (PF) 1 % IJ SOLN: 5.0000 mL | INTRAMUSCULAR | Status: DC | PRN

## 2013-04-09 NOTE — Progress Notes (Signed)
Subjective: Patient unchanged.  Agitated.  Per respiratory has followed some commands.  Nursing has not noted this.    Objective: Current vital signs: BP 116/0  Pulse 64  Temp(Src) 98.8 F (37.1 C) (Oral)  Resp 18  Ht 5\' 4"  (1.626 m)  Wt 79.7 kg (175 lb 11.3 oz)  BMI 30.15 kg/m2  SpO2 98% Vital signs in last 24 hours: Temp:  [97.9 F (36.6 C)-98.8 F (37.1 C)] 98.8 F (37.1 C) (02/17 0736) Pulse Rate:  [51-103] 64 (02/17 1020) Resp:  [18-21] 18 (02/17 1020) BP: (90-140)/(0-55) 116/0 mmHg (02/17 0736) SpO2:  [94 %-100 %] 98 % (02/17 1020) Arterial Line BP: (107-144)/(46-66) 144/66 mmHg (02/16 1600) FiO2 (%):  [30 %] 30 % (02/17 1020) Weight:  [79.7 kg (175 lb 11.3 oz)] 79.7 kg (175 lb 11.3 oz) (02/17 0500)  Intake/Output from previous day: 02/16 0701 - 02/17 0700 In: 1440.9 [I.V.:810.9; NG/GT:480; IV Piggyback:150] Out: 2542  Intake/Output this shift: Total I/O In: 303.2 [I.V.:243.2; NG/GT:60] Out: -  Nutritional status:    Neurologic Exam: Mental Status:  Eyes open but does not appear to focus on examiner. Does not follow commands. No attempts at verbalizations noted. Reaching for tube.  Cranial Nerves:  II: patient does not respond confrontation bilaterally, pupils right 2 mm, left 2 mm,and fixed bilaterally  III,IV,VI: doll's response absent bilaterally.  V,VII: corneal reflex weak bilaterally  VIII: grossly intact  IX,X: gag reflex reduced, XI: trapezius strength unable to test bilaterally  XII: tongue strength unable to test  Motor:  Moves both upper extremities against gravity  Deep Tendon Reflexes:  Absent throughout.  Plantars:  mute on the left.  Right BKA  Cerebellar:  Unable to perform   Lab Results: Basic Metabolic Panel:  Recent Labs Lab 04/02/2013 0439  04/18/2013 1900 04/10/2013 1937 04/06/13 0400 04/06/13 1600 04/07/13 0337 04/08/13 1048  NA 138  140  < > 141 140 139 138 139 140  K 3.2*  3.2*  < > 4.6 4.7 4.1 3.6* 4.5 4.2  CL 95*  96   < > 93* 100 88* 91* 98 99  CO2 26  24  < > 20  --  20 27 24 25   GLUCOSE 59*  60*  < > 176* 170* 246* 165* 99 214*  BUN 18  18  < > 25* 24* 25* 21 16 34*  CREATININE 4.06*  3.98*  < > 4.89* 4.60* 3.50* 2.67* 2.04* 3.55*  CALCIUM 8.2*  8.0*  < > 8.0*  --  7.4* 7.6* 8.0* 8.2*  MG  --   --   --   --  2.2  --  2.5  --   PHOS 3.0  --   --   --  3.3 1.8* 2.6 2.8  < > = values in this interval not displayed.  Liver Function Tests:  Recent Labs Lab 04/08/2013 1600 04/06/13 0400 04/06/13 1600 04/07/13 0337 04/08/13 1048  AST 295*  --   --   --   --   ALT 41*  --   --   --   --   ALKPHOS 231*  --   --   --   --   BILITOT 0.6  --   --   --   --   PROT 7.6  --   --   --   --   ALBUMIN 3.4* 3.5 3.4* 3.5 3.0*   No results found for this basename: LIPASE, AMYLASE,  in the last 168 hours  No results found for this basename: AMMONIA,  in the last 168 hours  CBC:  Recent Labs Lab 03/24/2013 1222  03/30/2013 1900 03/31/2013 1937 04/06/13 0400 04/07/13 0337 04/08/13 0400 04/09/13 0500  WBC 10.2  < > 14.1*  --  14.8* 13.3* 8.0 9.0  NEUTROABS 7.4  --   --   --   --   --   --   --   HGB 11.4*  < > 10.0* 12.2 11.1* 11.2* 10.4* 9.8*  HCT 35.4*  < > 32.3* 36.0 34.1* 34.4* 32.1* 30.8*  MCV 85.3  < > 87.5  --  84.4 84.3 85.4 86.0  PLT 151  < > 124*  --  PLATELET CLUMPS NOTED ON SMEAR, COUNT APPEARS DECREASED 122* 102* 104*  < > = values in this interval not displayed.  Cardiac Enzymes:  Recent Labs Lab 03/24/2013 1446 04/16/2013 2245 04/16/2013 0825 04/15/2013 1245  CKTOTAL 350*  --   --   --   CKMB 20.3*  --   --   --   TROPONINI 7.91* 14.40* 10.26* 8.78*    Lipid Panel:  Recent Labs Lab 04/07/2013 0439 04/08/13 1035  CHOL 118  --   TRIG 108 53  HDL 42  --   CHOLHDL 2.8  --   VLDL 22  --   LDLCALC 54  --     CBG:  Recent Labs Lab 04/08/13 1602 04/08/13 2050 04/08/13 2314 04/09/13 0349 04/09/13 0737  GLUCAP 127* 156* 187* 199* 188*    Microbiology: Results for orders  placed during the hospital encounter of 03/26/2013  MRSA PCR SCREENING     Status: None   Collection Time    04/17/2013  7:00 PM      Result Value Ref Range Status   MRSA by PCR NEGATIVE  NEGATIVE Final   Comment:            The GeneXpert MRSA Assay (FDA     approved for NASAL specimens     only), is one component of a     comprehensive MRSA colonization     surveillance program. It is not     intended to diagnose MRSA     infection nor to guide or     monitor treatment for     MRSA infections.  CULTURE, BLOOD (ROUTINE X 2)     Status: None   Collection Time    03/25/2013 12:59 PM      Result Value Ref Range Status   Specimen Description BLOOD LEFT HAND   Final   Special Requests BOTTLES DRAWN AEROBIC ONLY 4CC   Final   Culture  Setup Time     Final   Value: 04/04/2013 16:40     Performed at [facility]   Culture     Final   Value:        BLOOD CULTURE RECEIVED NO GROWTH TO DATE CULTURE WILL BE HELD FOR 5 DAYS BEFORE ISSUING A FINAL NEGATIVE REPORT     Performed at [facility]   Report Status PENDING   Incomplete  CULTURE, BLOOD (ROUTINE X 2)     Status: None   Collection Time    04/08/2013  1:07 PM      Result Value Ref Range Status   Specimen Description BLOOD LEFT HAND   Final   Special Requests BOTTLES DRAWN AEROBIC ONLY 2CC   Final   Culture  Setup Time     Final   Value: 03/30/2013  16:40     Performed at [facility]     Final   Value:        BLOOD CULTURE RECEIVED NO GROWTH TO DATE CULTURE WILL BE HELD FOR 5 DAYS BEFORE ISSUING A FINAL NEGATIVE REPORT     Performed at [facility]   Report Status PENDING   Incomplete    Coagulation Studies: No results found for this basename: LABPROT, INR,  in the last 72 hours  Imaging: Dg Chest Port 1 View  04/08/2013   CLINICAL DATA:  Followup respiratory failure.  EXAM: PORTABLE CHEST - 1 VIEW  COMPARISON:  DG CHEST 1V PORT dated 04/06/2013; DG CHEST 1V PORT dated 04/12/2013  FINDINGS:  Prior CABG. Endotracheal tube has been slightly retracted. The tip is now approximately 4 cm above the carina. Cardiomegaly with vascular congestion and bibasilar atelectasis.  IMPRESSION: Slight retraction of the endotracheal tube, now 4 cm above the carina.  Continued vascular congestion and bibasilar atelectasis.   Electronically Signed   By: Charlett Nose M.D.   On: 04/08/2013 06:52    Medications:  I have reviewed the patient's current medications. Scheduled: . antiseptic oral rinse  15 mL Mouth Rinse QID  . aspirin  81 mg Per Tube Daily  . calcitRIOL  1 mcg Per Tube Once per day on Mon Wed Fri  . chlorhexidine  15 mL Mouth Rinse BID  . darbepoetin (ARANESP) injection - DIALYSIS  25 mcg Intravenous Q Mon-HD  . feeding supplement (NEPRO CARB STEADY)  1,000 mL Per Tube Q24H  . heparin  7,000 Units Intravenous Once  . hydrocortisone sod succinate (SOLU-CORTEF) inj  50 mg Intravenous Q8H  . insulin aspart  0-15 Units Subcutaneous 6 times per day  . pantoprazole sodium  40 mg Per Tube Q1200  . piperacillin-tazobactam (ZOSYN)  IV  2.25 g Intravenous 3 times per day    Assessment/Plan: Patient unchanged.  MRI pending.    Recommendations: 1. MRI once stable.     LOS: 5 days   Thana Farr, MD [facility] [phone] 04/09/2013  12:54 PM

## 2013-04-09 NOTE — Progress Notes (Signed)
ANTICOAGULATION CONSULT NOTE - Follow Up Consult  Pharmacy Consult for heparin Indication: NSTEMI   Patient Measurements: Height: 5\' 4"  (162.6 cm) Weight: 175 lb 11.3 oz (79.7 kg) IBW/kg (Calculated) : 54.7 Heparin Dosing Weight: 71.8  Vital Signs: Temp: 98.8 F (37.1 C) (02/17 0736) Temp src: Oral (02/17 0736) BP: 116/0 mmHg (02/17 0736) Pulse Rate: 65 (02/17 0835)  Labs:  Recent Labs  04/06/13 1600  04/07/13 0337 04/08/13 0400 04/08/13 1048 04/09/13 0500  HGB  --   < > 11.2* 10.4*  --  9.8*  HCT  --   --  34.4* 32.1*  --  30.8*  PLT  --   --  122* 102*  --  104*  APTT  --   --  178*  --   --   --   HEPARINUNFRC  --   --  0.52 0.41  --  0.28*  CREATININE 2.67*  --  2.04*  --  3.55*  --   < > = values in this interval not displayed.   Assessment: 58yo female on heparin after resumed post-cath on 2/13 which showed EF 60%, severe CAD with total occlusion of mid LAD, LCx, and proximal RCA, patent SVG to RCA with mild in-stent restenosis, patent SVG to OM, patent LIMA to LAD. Patient coded post-cath and has been intubated since then. Pharmacy is consulted to manage her anticoagulation with heparin.  HL slightly subtherapeutic this morning at 0.28 at gtt rate ~14 units/kg/hr. H/H, plt low but stable with no reported s/s bleeding.   Of note, an MRI is to be performed when patient is more stable to rule out stroke. Initial head CT negative for bleed. Since level is subtherapeutic, will increase rate slightly, but will dose more conservatively until bleed can be further ruled out.   Goal of Therapy:  Heparin level 0.3-0.7 units/ml   Plan:  - Increase heparin to 1050 units/hr - Check 6 hour HL - Daily HL, CBC - Monitor for s/s bleeding  Tagg Eustice C. Christos Mixson, PharmD Clinical Pharmacist-Resident Pager: [phone] Pharmacy: [phone] 04/09/2013 9:49 AM

## 2013-04-09 NOTE — Progress Notes (Signed)
Assessment/Plan:  1. NSTEMI / hx CABG - patent grafts by cath 2/13  2. Cardiac arrest - post cath, CPR ~5 min, intubated  3. Hypotension -resolved 4. ESRD - Now off CRRT. IHD on Wed. 5. 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 Resume binders and Sensipar once extubated/eating 7. HD access - S/P removal degenerated RUA AVG and placement new AVG at same site 2/10 (Dr Darrick PennaFields). Using R fem TDC  Subjective: Interval History:Remains on vent w/ AMS  Objective: Vital signs in last 24 hours: Temp:  [97.9 F (36.6 C)-98.8 F (37.1 C)] 98.8 F (37.1 C) (02/17 0736) Pulse Rate:  [50-103] 65 (02/17 0835) Resp:  [18-28] 19 (02/17 0835) BP: (90-140)/(0-58) 116/0 mmHg (02/17 0736) SpO2:  [94 %-100 %] 98 % (02/17 0835) Arterial Line BP: (90-146)/(41-66) 144/66 mmHg (02/16 1600) FiO2 (%):  [30 %] 30 % (02/17 0835) Weight:  [79.7 kg (175 lb 11.3 oz)-81.9 kg (180 lb 8.9 oz)] 79.7 kg (175 lb 11.3 oz) (02/17 0500) Weight change:   Intake/Output from previous day: 02/16 0701 - 02/17 0700 In: 1440.9 [I.V.:810.9; NG/GT:480; IV Piggyback:150] Out: 2542  Intake/Output this shift: Total I/O In: 59 [I.V.:39; NG/GT:20] Out: -   General appearance: uncooperative and delerious GI: soft, non-tender; bowel sounds normal; no masses,  no organomegaly Extremities: tr edema, bka  Lab Results:  Recent Labs  04/08/13 0400 04/09/13 0500  WBC 8.0 9.0  HGB 10.4* 9.8*  HCT 32.1* 30.8*  PLT 102* 104*   BMET:  Recent Labs  04/07/13 0337 04/08/13 1048  NA 139 140  K 4.5 4.2  CL 98 99  CO2 24 25  GLUCOSE 99 214*  BUN 16 34*  CREATININE 2.04* 3.55*  CALCIUM 8.0* 8.2*   No results found for this basename: PTH,  in the last 72 hours Iron Studies: No results found for this basename: IRON, TIBC, TRANSFERRIN, FERRITIN,  in the last 72 hours Studies/Results: Dg Chest Port 1 View  04/08/2013   CLINICAL DATA:  Followup respiratory failure.  EXAM: PORTABLE CHEST - 1 VIEW  COMPARISON:  DG  CHEST 1V PORT dated 04/06/2013; DG CHEST 1V PORT dated 04/18/2013  FINDINGS: Prior CABG. Endotracheal tube has been slightly retracted. The tip is now approximately 4 cm above the carina. Cardiomegaly with vascular congestion and bibasilar atelectasis.  IMPRESSION: Slight retraction of the endotracheal tube, now 4 cm above the carina.  Continued vascular congestion and bibasilar atelectasis.   Electronically Signed   By: Charlett NoseKevin  Dover M.D.   On: 04/08/2013 06:52   Scheduled: . antiseptic oral rinse  15 mL Mouth Rinse QID  . aspirin  81 mg Per Tube Daily  . calcitRIOL  1 mcg Per Tube Once per day on Mon Wed Fri  . chlorhexidine  15 mL Mouth Rinse BID  . darbepoetin (ARANESP) injection - DIALYSIS  25 mcg Intravenous Q Mon-HD  . feeding supplement (NEPRO CARB STEADY)  1,000 mL Per Tube Q24H  . heparin  7,000 Units Intravenous Once  . hydrocortisone sod succinate (SOLU-CORTEF) inj  50 mg Intravenous Q8H  . insulin aspart  0-15 Units Subcutaneous 6 times per day  . pantoprazole sodium  40 mg Per Tube Q1200  . piperacillin-tazobactam (ZOSYN)  IV  2.25 g Intravenous 3 times per day     LOS: 5 days   Brittany Amirault C 04/09/2013,9:34 AM

## 2013-04-09 NOTE — Progress Notes (Signed)
eLink Physician-Brief Progress Note Patient Name: Margo AyeBarbara J Poe DOB: 10-01-1955 MRN: [id number]  Date of Service  04/09/2013   HPI/Events of Note   Mild hypotension from propofol Overall clinical improvement  eICU Interventions  MAP goal > 55   Intervention Category Intermediate Interventions: Hypotension - evaluation and management  Isao Seltzer 04/09/2013, 8:38 PM

## 2013-04-09 NOTE — Progress Notes (Signed)
ANTICOAGULATION CONSULT NOTE - Follow Up Consult  Pharmacy Consult for heparin Indication: NSTEMI  Allergies  Allergen Reactions  . Lipitor [Atorvastatin Calcium] Other (See Comments)    Muscle weakness  . Codeine Nausea And Vomiting  . Metoclopramide Hcl Other (See Comments)    Tardive dyskinesia  . Iohexol Itching    Facility where pt lives does not have this listed as an allergy and she has not been pre-medicated in the past  . Oxycodone Hypertension    Patient Measurements: Height: 5\' 4"  (162.6 cm) Weight: 173 lb 11.6 oz (78.8 kg) IBW/kg (Calculated) : 54.7 Heparin Dosing Weight: 71.8 kg  Vital Signs: Temp: 98.7 F (37.1 C) (02/17 1600) Temp src: Oral (02/17 1600) BP: 94/38 mmHg (02/17 1722) Pulse Rate: 65 (02/17 1722)  Labs:  Recent Labs  04/07/13 0337 04/08/13 0400 04/08/13 1048 04/09/13 0500 04/09/13 1616  HGB 11.2* 10.4*  --  9.8*  --   HCT 34.4* 32.1*  --  30.8*  --   PLT 122* 102*  --  104*  --   APTT 178*  --   --   --   --   HEPARINUNFRC 0.52 0.41  --  0.28* 0.26*  CREATININE 2.04*  --  3.55*  --   --     Estimated Creatinine Clearance: 17.3 ml/min (by C-G formula based on Cr of 3.55).   Medications:  Infusions:  . sodium chloride 10 mL/hr at 04/09/13 1635  . sodium chloride 10 mL/hr at 04/09/13 1635  . heparin 1,050 Units/hr (04/09/13 1002)  . dialysis replacement fluid (prismasate) 400 mL/hr at 04/06/13 2351  . propofol 10 mcg/kg/min (04/09/13 1635)    Assessment: 58yo female on heparin after resumed post-cath on 2/13 which showed EF 60%, severe CAD with total occlusion of mid LAD, LCx, and proximal RCA, patent SVG to RCA with mild in-stent restenosis, patent SVG to OM, patent LIMA to LAD. Patient coded post-cath and has been intubated since then. Pharmacy is consulted to manage her anticoagulation with heparin.  Heparin level is subtherapeutic at 0.26 on 1050 units/hr. No bleeding noted, Hb has trended down, platelets are low - will  continue to watch trend. Will also decrease heparin goal while r/o stroke, MRI pending.  Goal of Therapy:  Heparin level 0.3-0.5 units/ml (while r/o stroke) Monitor platelets by anticoagulation protocol: Yes   Plan:  -Increase heparin drip to 1150 units/hr -8 hr heparin level -Daily heparin level and CBC -Monitor for s/sx of bleeding  Nathan Littauer HospitalJennifer [place], [address] BoulevardPharm.D., BCPS Clinical Pharmacist Pager: [phone] 04/09/2013 6:56 PM

## 2013-04-09 NOTE — Procedures (Signed)
Arterial Catheter Insertion Procedure Note Faith AyeBarbara J [name] [id number] 1955/05/07  Procedure: Insertion of Arterial Catheter  Indications: Blood pressure monitoring and Frequent blood sampling  Procedure Details Consent: Unable to obtain consent because of emergent medical necessity. Time Out: Verified patient identification, verified procedure, site/side was marked, verified correct patient position, special equipment/implants available, medications/allergies/relevent history reviewed, required imaging and test results available.  Performed  Maximum sterile technique was used including antiseptics, cap, gloves, gown, hand hygiene, mask and sheet. Skin prep: Chlorhexidine; local anesthetic administered 20 gauge catheter was inserted into left radial artery using the Seldinger technique.  Evaluation Blood flow good; BP tracing good. Complications: No apparent complications.   Faith [name],Faith [name] 04/09/2013

## 2013-04-09 NOTE — Progress Notes (Signed)
Pulmonary/Critical Care Progress Note   Name: Faith AyeBarbara J [name] MRN: [id number] DOB: November 05, 1955   LOS 5 days   ADMISSION DATE:  04/18/2013 CONSULTATION DATE:  04/12/2013  REFERRING MD :  Allyson SabalBerry  CHIEF COMPLAINT:  Cardiac Arrest  BRIEF PATIENT DESCRIPTION:  58 y.o. F admitted on 2/9 for profuse bleeding from right arm graft site.  Graft was clotted, failed thrombectomy.  Discharged from ER and sent home, presented again 2/12 for weakness.  Workup in ED with elevated troponins/NSTEMI.  Pt taken to cath lab 2/13 and suffered cardiac arrest post cath.  PCCM consulted for further management.  SIGNIFICANT EVENTS / STUDIES:  2/9 profuse bleeding from right arm graft site. Failed thrombectomy. Discharged from ER 2/13 Back to ER with weakness/lethargy, found to have NSTEMI/elevated troponins. Cardiac cath: Severe native vessel coronary disease with total occlusion of the proximal to mid LAD, occlusion of the circumflex in its mid-segment, and total occlusion of the proximal RCA. Patent SVG to the right coronary with mild to moderate in-stent restenosis in the proximal segment. Patent saphenous vein graft to the obtuse marginal. Patent left internal mammary graft to the LAD 2/13 Neuro consult: Suspect TME. EEG, MRI recommended 2/14 Extended conversation with pt's husband who acknowledged that pt has been in failing health for many years with declining QOL. Advanced directives and limitations of care discussed. No further ACLS under any circumstances. Cont aggressive support through WE. Once extubated, no re-intubation. 2/14 CT head: NAD 2/14 EEG: This is a technically difficult record secondary to the predominance of muscle and movement artifact. On only rare occasions can a background activity be evaluated. At these times the background is slow and no epileptiform activity is noted.  2/14 Echo: LVEF 30-35%. Diffuse HK. Grade 2 diastolic dysfunction. RV moderately dilated. PASP estimate 54 mmHg 2/15 Weaning  vasopressors. More responsive. + F/C intermittently  LINES / TUBES: R fem HD cath >>  ETT 12/13 >>> Left radial A-line 2/13 >>  L femoral TLC 2/13 >>   CULTURES: Blood 2/13>>>  ANTIBIOTICS: Zosyn 2/15>>>  SUBJECTIVE: Intermittently agitated, weaning on high PS.  VITAL SIGNS: Temp:  [97.9 F (36.6 C)-98.8 F (37.1 C)] 98.8 F (37.1 C) (02/17 0736) Pulse Rate:  [50-103] 58 (02/17 0736) Resp:  [18-31] 18 (02/17 0736) BP: (90-140)/(0-58) 116/0 mmHg (02/17 0736) SpO2:  [94 %-100 %] 100 % (02/17 0736) Arterial Line BP: (90-146)/(41-66) 144/66 mmHg (02/16 1600) FiO2 (%):  [30 %] 30 % (02/17 0835) Weight:  [79.7 kg (175 lb 11.3 oz)-81.9 kg (180 lb 8.9 oz)] 79.7 kg (175 lb 11.3 oz) (02/17 0500) HEMODYNAMICS:   VENTILATOR SETTINGS: Vent Mode:  [-] PRVC FiO2 (%):  [30 %] 30 % Set Rate:  [18 bmp] 18 bmp Vt Set:  [440 mL] 440 mL PEEP:  [5 cmH20] 5 cmH20 Pressure Support:  [15 cmH20] 15 cmH20 Plateau Pressure:  [17 cmH20-23 cmH20] 22 cmH20 INTAKE / OUTPUT: Intake/Output     02/16 0701 - 02/17 0700 02/17 0701 - 02/18 0700   I.V. (mL/kg) 810.9 (10.2) 39 (0.5)   NG/GT 480 20   IV Piggyback 150    Total Intake(mL/kg) 1440.9 (18.1) 59 (0.7)   Other 2542    Total Output 2542     Net -1101.2 +59         PHYSICAL EXAMINATION: General:  Chronically ill, minimally responsive Neuro:  Not F/C HEENT: WNL Cardiovascular: Reg, no M Lungs:  CTA bilaterally Abdomen:  BS x 4, soft, NT/ND. Musculoskeletal:  Right BKA. No edema.  PULMONARY  Recent Labs Lab 04/04/2013 1555 04/17/2013 1645 03/29/2013 1927 03/27/2013 1937 03/30/2013 1948 04/04/2013 2110 04/06/13 0401  PHART 7.145* 7.228* 7.336*  --   --  7.202* 7.473*  PCO2ART 37.9 34.6* 33.0*  --   --  33.6* 32.8*  PO2ART 101.0* 301.0* 77.0*  --   --  125.0* 143.0*  HCO3 13.1* 14.5* 17.7*  --   --  13.3* 24.1*  TCO2 14 16 19 17   --  14 25  O2SAT 96.0 100.0 95.0  --  78.8 98.0 99.0   CBC  Recent Labs Lab 04/07/13 0337 04/08/13 0400  04/09/13 0500  HGB 11.2* 10.4* 9.8*  HCT 34.4* 32.1* 30.8*  WBC 13.3* 8.0 9.0  PLT 122* 102* 104*   COAGULATION  Recent Labs Lab 04/23/2013 1413 03/25/2013 0439 03/24/2013 0825 04/15/2013 1600  INR 2.29* 6.27* 1.84* 1.89*   CARDIAC   Recent Labs Lab April 23, 2013 1446 04/23/2013 2245 03/29/2013 0825 04/04/2013 1245  TROPONINI 7.91* 14.40* 10.26* 8.78*   Recent Labs Lab Apr 23, 2013 2245  PROBNP 44743.0*   CHEMISTRY  Recent Labs Lab 04/15/2013 0439  03/27/2013 1900 04/15/2013 1937 04/06/13 0400 04/06/13 1600 04/07/13 0337 04/08/13 1048  NA 138  140  < > 141 140 139 138 139 140  K 3.2*  3.2*  < > 4.6 4.7 4.1 3.6* 4.5 4.2  CL 95*  96  < > 93* 100 88* 91* 98 99  CO2 26  24  < > 20  --  20 27 24 25   GLUCOSE 59*  60*  < > 176* 170* 246* 165* 99 214*  BUN 18  18  < > 25* 24* 25* 21 16 34*  CREATININE 4.06*  3.98*  < > 4.89* 4.60* 3.50* 2.67* 2.04* 3.55*  CALCIUM 8.2*  8.0*  < > 8.0*  --  7.4* 7.6* 8.0* 8.2*  MG  --   --   --   --  2.2  --  2.5  --   PHOS 3.0  --   --   --  3.3 1.8* 2.6 2.8  < > = values in this interval not displayed. Estimated Creatinine Clearance: 17.4 ml/min (by C-G formula based on Cr of 3.55).  LIVER  Recent Labs Lab 04/23/2013 1413  03/24/2013 0439 04/01/2013 0825 04/07/2013 1600 04/06/13 0400 04/06/13 1600 04/07/13 0337 04/08/13 1048  AST  --   --   --   --  295*  --   --   --   --   ALT  --   --   --   --  41*  --   --   --   --   ALKPHOS  --   --   --   --  231*  --   --   --   --   BILITOT  --   --   --   --  0.6  --   --   --   --   PROT  --   --   --   --  7.6  --   --   --   --   ALBUMIN  --   < > 3.6  --  3.4* 3.5 3.4* 3.5 3.0*  INR 2.29*  --  6.27* 1.84* 1.89*  --   --   --   --   < > = values in this interval not displayed.  INFECTIOUS  Recent Labs Lab 04/10/2013 1900 04/17/2013 1940 04/06/13 0400 04/07/13 1610  LATICACIDVEN 10.8*  --   --   --   PROCALCITON  --  2.71 3.18 4.18   ENDOCRINE CBG (last 3)   Recent Labs  04/08/13 2314  04/09/13 0349 04/09/13 0737  GLUCAP 187* 199* 188*   IMAGING x48h  Dg Chest Port 1 View  04/08/2013   CLINICAL DATA:  Followup respiratory failure.  EXAM: PORTABLE CHEST - 1 VIEW  COMPARISON:  DG CHEST 1V PORT dated 04/06/2013; DG CHEST 1V PORT dated 04/06/2013  FINDINGS: Prior CABG. Endotracheal tube has been slightly retracted. The tip is now approximately 4 cm above the carina. Cardiomegaly with vascular congestion and bibasilar atelectasis.  IMPRESSION: Slight retraction of the endotracheal tube, now 4 cm above the carina.  Continued vascular congestion and bibasilar atelectasis.   Electronically Signed   By: Charlett Nose M.D.   On: 04/08/2013 06:52   ASSESSMENT / PLAN:  PULMONARY A: Acute Respiratory Failure  216/15: Does not meet extubation criteria due to encephalopathy and higher PSV need  P:   - SBT as tolerated. No extubation given need for high PS.  Will need to discuss trach/peg options with family. - Vent settings reviewed and addressed. - Cont vent bundle. - Daily SBT. - Once optimized, extubate with no re-intubation if fails (per discussion with husband 2/14).  CARDIOVASCULAR A:  S/p Cardiac Arrest Elevated Troponins CAD  P:  - Further eval and mgmt per Cards. - No acute interventions at this time  RENAL A: ESRD Metabolic Acidosis, resolved Will need additional volume off prior serious consideration of extubation. P:   - HD 04/08/13 per renal.  GASTROINTESTINAL A:  No acute issues. P:   - SUP:  Pantoprazole. - Cont TFs @ current rate.  HEMATOLOGIC A:  Mild anemia without acute blood loss Mild thrombocytopenia P:  - DVT px: full dose heparin. - CBC intermittently. - PRBC for hgb < 8gm% due to cardiac arrest.  INFECTIOUS A:   No acute issues. P:   - Micro and abx as above.  ENDOCRINE A:  DM II, controlled P:   - Lantus DC'd. - Cont SSI.  NEUROLOGIC A:  Acute encephalopathy, improving Chronic cognitive impairment, NOS Tardive  Dyskinesia  P:   - Changed fent gtt to diprivan gtt + fent prn. - Minimize sedation as able specially once ready for extubation. - Daily WUA.  GLOBAL Plan will be to optimize from a pulmonary standpoint, then extubate with no intention to reintubate.  The issue of trach/peg has not been addressed with family but given nature of discussions is highly unlikely. Will contact renal today ?extra HD today then again tomorrow with extubate.  The patient is critically ill with multiple organ systems failure and requires high complexity decision making for assessment and support, frequent evaluation and titration of therapies, application of advanced monitoring technologies and extensive interpretation of multiple databases.   Critical Care Time devoted to patient care services described in this note is  35  Minutes.  Alyson Reedy, M.D. [facility] Pulmonary/Critical Care Medicine. Pager: [phone]. After hours pager: [phone].

## 2013-04-10 ENCOUNTER — Inpatient Hospital Stay (HOSPITAL_COMMUNITY): Payer: Medicare (Managed Care)

## 2013-04-10 LAB — BASIC METABOLIC PANEL
BUN: 19 mg/dL (ref 6–23)
CHLORIDE: 98 meq/L (ref 96–112)
CO2: 25 mEq/L (ref 19–32)
CREATININE: 2.5 mg/dL — AB (ref 0.50–1.10)
Calcium: 9.1 mg/dL (ref 8.4–10.5)
GFR calc non Af Amer: 20 mL/min — ABNORMAL LOW (ref >90)
GFR, EST AFRICAN AMERICAN: 23 mL/min — AB (ref >90)
Glucose, Bld: 216 mg/dL — ABNORMAL HIGH (ref 70–99)
POTASSIUM: 3.3 meq/L — AB (ref 3.7–5.3)
Sodium: 138 mEq/L (ref 137–147)

## 2013-04-10 LAB — BLOOD GAS, ARTERIAL
Acid-Base Excess: 3.4 mmol/L — ABNORMAL HIGH (ref 0.0–2.0)
Bicarbonate: 26.2 mEq/L — ABNORMAL HIGH (ref 20.0–24.0)
Drawn by: 252031
FIO2: 0.3 %
MECHVT: 440 mL
O2 SAT: 95.3 %
PEEP/CPAP: 5 cmH2O
Patient temperature: 98.6
RATE: 18 resp/min
TCO2: 27.2 mmol/L (ref 0–100)
pCO2 arterial: 31.7 mmHg — ABNORMAL LOW (ref 35.0–45.0)
pH, Arterial: 7.528 — ABNORMAL HIGH (ref 7.350–7.450)
pO2, Arterial: 68.7 mmHg — ABNORMAL LOW (ref 80.0–100.0)

## 2013-04-10 LAB — GLUCOSE, CAPILLARY
GLUCOSE-CAPILLARY: 200 mg/dL — AB (ref 70–99)
GLUCOSE-CAPILLARY: 157 mg/dL — AB (ref 70–99)
GLUCOSE-CAPILLARY: 162 mg/dL — AB (ref 70–99)
Glucose-Capillary: 189 mg/dL — ABNORMAL HIGH (ref 70–99)
Glucose-Capillary: 190 mg/dL — ABNORMAL HIGH (ref 70–99)
Glucose-Capillary: 148 mg/dL — ABNORMAL HIGH (ref 70–99)
Glucose-Capillary: 192 mg/dL — ABNORMAL HIGH (ref 70–99)

## 2013-04-10 LAB — PHOSPHORUS: Phosphorus: 2.1 mg/dL — ABNORMAL LOW (ref 2.3–4.6)

## 2013-04-10 LAB — HEPARIN LEVEL (UNFRACTIONATED): Heparin Unfractionated: 0.28 IU/mL — ABNORMAL LOW (ref 0.30–0.70)

## 2013-04-10 LAB — MAGNESIUM: Magnesium: 2.2 mg/dL (ref 1.5–2.5)

## 2013-04-10 MED ORDER — DEXMEDETOMIDINE HCL IN NACL 200 MCG/50ML IV SOLN: 0.4000 ug/kg/h | INTRAVENOUS | Status: DC

## 2013-04-10 MED ORDER — PROPOFOL 10 MG/ML IV EMUL
5.0000 ug/kg/min | INTRAVENOUS | Status: DC
  Administered 2013-04-10: 20 ug/kg/min via INTRAVENOUS
  Administered 2013-04-11: 30 ug/kg/min via INTRAVENOUS
  Administered 2013-04-11: 20 ug/kg/min via INTRAVENOUS
  Filled 2013-04-10 (×3): qty 100

## 2013-04-10 MED ORDER — DEXMEDETOMIDINE HCL IN NACL 200 MCG/50ML IV SOLN
0.4000 ug/kg/h | INTRAVENOUS | Status: DC
  Administered 2013-04-10: 0.4 ug/kg/h via INTRAVENOUS
  Filled 2013-04-10: qty 50

## 2013-04-10 NOTE — Progress Notes (Signed)
BP dropping during hemodialysis. Not able to pull any fluid. Dr. Lowell GuitarPowell called. Tx stopped 1 Hr early due to not being able to pull fluid due to hypotension

## 2013-04-10 NOTE — Progress Notes (Signed)
Assessment/Plan:  1. NSTEMI / hx CABG - patent grafts by cath 2/13  2. Cardiac arrest - post cath, CPR ~5 min, intubated  3. Hypotension -resolved 4. ESRD - Plan extra HD treatment today 5. Volume - improved, and Dr. Molli Knock has asked for aggressive  fluid removal 6. 2HPTH - Continue po Vit D per tube. Resume binders and Sensipar once extubated/eating 7. HD access - S/P removal degenerated RUA AVG and placement new AVG at same site 2/10 (Dr Darrick Penna). Using R fem TDC   Subjective: Interval History: 1.6 liters off with HD yesterday  Objective: Vital signs in last 24 hours: Temp:  [98.7 F (37.1 C)-98.8 F (37.1 C)] 98.7 F (37.1 C) (02/18 0400) Pulse Rate:  [53-113] 92 (02/18 0800) Resp:  [8-21] 18 (02/18 0800) BP: (85-161)/(38-82) 129/50 mmHg (02/18 0800) SpO2:  [94 %-100 %] 100 % (02/18 0800) Arterial Line BP: (93-191)/(36-97) 183/97 mmHg (02/18 0600) FiO2 (%):  [30 %] 30 % (02/18 0800) Weight:  [78.8 kg (173 lb 11.6 oz)-80.4 kg (177 lb 4 oz)] 78.8 kg (173 lb 11.6 oz) (02/17 1711) Weight change: -1.5 kg (-3 lb 4.9 oz)  Intake/Output from previous day: 02/17 0701 - 02/18 0700 In: 1662.7 [I.V.:1052.7; NG/GT:460; IV Piggyback:150] Out: 1612  Intake/Output this shift:   General appearance: uncooperative and delerious  Lungs clear Cor RRR AVG LUE Clotted GI: soft, non-tender; bowel sounds normal; no masses, no organomegaly  Extremities: tr edema, bka     General appearance: agitated Resp: clear to auscultation bilaterally Chest wall: no tenderness Cardio: regular rate and rhythm, S1, S2 normal, no murmur, click, rub or gallop Extremities: extremities normal, atraumatic, no cyanosis or edema  Lab Results:  Recent Labs  04/08/13 0400 04/09/13 0500  WBC 8.0 9.0  HGB 10.4* 9.8*  HCT 32.1* 30.8*  PLT 102* 104*   BMET:  Recent Labs  04/08/13 1048 04/10/13 0500  NA 140 138  K 4.2 3.3*  CL 99 98  CO2 25 25  GLUCOSE 214* 216*  BUN 34* 19  CREATININE 3.55* 2.50*   CALCIUM 8.2* 9.1   No results found for this basename: PTH,  in the last 72 hours Iron Studies: No results found for this basename: IRON, TIBC, TRANSFERRIN, FERRITIN,  in the last 72 hours Studies/Results: Dg Chest Port 1 View  04/10/2013   CLINICAL DATA:  Ventilator.  EXAM: PORTABLE CHEST - 1 VIEW  COMPARISON:  04/08/2013  FINDINGS: Endotracheal tube is approximately 3 cm above the carina. Cardiomegaly with vascular congestion and bilateral lower lobe opacities. This is increased in the right lower lobe since prior study. No visible effusions. No acute bony abnormality.  IMPRESSION: Bilateral perihilar and lower lobe opacities. This is increased slightly in the right lower lobe. This most likely represents edema. Cannot exclude infection.   Electronically Signed   By: Charlett Nose M.D.   On: 04/10/2013 05:08   Scheduled: . antiseptic oral rinse  15 mL Mouth Rinse QID  . aspirin  81 mg Per Tube Daily  . calcitRIOL  1 mcg Per Tube Once per day on Mon Wed Fri  . chlorhexidine  15 mL Mouth Rinse BID  . darbepoetin (ARANESP) injection - DIALYSIS  25 mcg Intravenous Q Mon-HD  . feeding supplement (NEPRO CARB STEADY)  1,000 mL Per Tube Q24H  . heparin  7,000 Units Intravenous Once  . hydrocortisone sod succinate (SOLU-CORTEF) inj  50 mg Intravenous Q8H  . insulin aspart  0-15 Units Subcutaneous 6 times per day  .  pantoprazole sodium  40 mg Per Tube Q1200  . piperacillin-tazobactam (ZOSYN)  IV  2.25 g Intravenous 3 times per day     LOS: 6 days   Evaleen Sant C 04/10/2013,8:59 AM

## 2013-04-10 NOTE — Progress Notes (Signed)
Spoke with Dr. Molli KnockYacoub to report the difficulty that patient was having during HD.  BP is low.  HD RN called Dr. Lowell GuitarPowell and HD will be stopped.  HR is now in the 40's.  Precedex was stopped.  Propofol will be restarted per Dr. Molli KnockYacoub. During HD, RT attempted to wean and pt became anxious and agitated and failed weaning process. Will cont to monitor patient.

## 2013-04-10 NOTE — Progress Notes (Signed)
Inpatient Diabetes Program Recommendations  AACE/ADA: New Consensus Statement on Inpatient Glycemic Control (2013)  Target Ranges:  Prepandial:   less than 140 mg/dL      Peak postprandial:   less than 180 mg/dL (1-2 hours)      Critically ill patients:  140 - 180 mg/dL   Results for Faith [name], Del J (MRN [id number]) as of 04/10/2013 08:08  Ref. Range 04/09/2013 03:49 04/09/2013 07:37 04/09/2013 13:21 04/09/2013 16:30 04/09/2013 19:29 04/10/2013 00:17 04/10/2013 05:00  Glucose-Capillary Latest Range: 70-99 mg/dL 147199 (H) 829188 (H) 562231 (H) 147 (H) 133 (H) 189 (H) 200 (H)   Diabetes history: DM2 Outpatient Diabetes medications: Lantus 50 units QHS Current orders for Inpatient glycemic control: Novolog 0-15 units Q4H  Inpatient Diabetes Program Recommendations Insulin - Basal: Please consider ordering Lantus 10 units Q24H.  Note: Patient remains intubated and continues to receive Solumedrol 50 mg Q8H and Nepro tube feeding @ 3220ml/hr which is contributing to elevated glucose. Patient received a total of Novolog 18 units for glucose correction on 04/09/13. Please consider ordering low dose basal insulin; recommend starting with Lantus 10 units Q24H.  Will continue to follow.  Thanks, Orlando PennerMarie Laberta Wilbon, RN, MSN, CCRN Diabetes Coordinator Inpatient Diabetes Program [phone] (Team Pager) [phone] ([facility] office) [phone] [facility] office)

## 2013-04-10 NOTE — Progress Notes (Addendum)
Subjective: Afib overnight, difficulty weaning. Family discussing withdrawal of care.   Exam: Filed Vitals:   04/10/13 1000  Pulse: 90  Temp:   Resp: 18   Gen: In bed, NAD MS: Does not follow commands, but does open eyes and moves spontaneously.  RU:EAVWUCN:PERRL, doe snot clearly blink to threat, does blink to eyelid stim bilaterally.  Motor: moves bilateral arms spontaneously and to nox stim. Minimal withdrawal in leg to nox stim Sensory:as above.   Impression: 58 yo F with multiple medical problems including ESRD, afib, with arrest s/p angiogram and now persistent delirium in the setting of multiple medical problems. I suspect a multifactorial delirium. An MRI could be useful if family decides on aggressive care this could be helpful.   Recommendations: 1) MRI brain if aggressive care is continued.   Ritta SlotMcNeill Marelly Wehrman, MD [facility] [phone]  If 7pm- 7am, please page neurology on call at [phone].

## 2013-04-10 NOTE — Progress Notes (Signed)
ANTICOAGULATION CONSULT NOTE - Follow Up Consult  Pharmacy Consult for Heparin  Indication: chest pain/ACS  Allergies  Allergen Reactions  . Lipitor [Atorvastatin Calcium] Other (See Comments)    Muscle weakness  . Codeine Nausea And Vomiting  . Metoclopramide Hcl Other (See Comments)    Tardive dyskinesia  . Iohexol Itching    Facility where pt lives does not have this listed as an allergy and she has not been pre-medicated in the past  . Oxycodone Hypertension    Patient Measurements: Height: 5\' 4"  (162.6 cm) Weight: 173 lb 11.6 oz (78.8 kg) IBW/kg (Calculated) : 54.7 Heparin Dosing Weight: ~72 kg  Vital Signs: Temp: 98.7 F (37.1 C) (02/18 0400) Temp src: Oral (02/18 0400) BP: 161/82 mmHg (02/17 2319) Pulse Rate: 113 (02/18 0500)  Labs:  Recent Labs  04/08/13 0400 04/08/13 1048 04/09/13 0500 04/09/13 1616 04/10/13 0400 04/10/13 0500  HGB 10.4*  --  9.8*  --   --   --   HCT 32.1*  --  30.8*  --   --   --   PLT 102*  --  104*  --   --   --   HEPARINUNFRC 0.41  --  0.28* 0.26* 0.28*  --   CREATININE  --  3.55*  --   --   --  2.50*    Estimated Creatinine Clearance: 24.6 ml/min (by C-G formula based on Cr of 2.5).   Medications:  Heparin 1150 units/hr  Assessment: 58 y/o F on heparin post-cath, HL 0.28, other labs as above, noted renal dysfunction and plans for iHD.   Goal of Therapy:  Heparin level 0.3-0.5 units/ml Monitor platelets by anticoagulation protocol: Yes   Plan:  -Increase heparin drip to 1250 units/hr -1500 HL -Daily CBC/HL -F/U length of treatment with heparin  -Monitor for bleeding  Faith [name], Faith [name] 04/10/2013,6:26 AM

## 2013-04-10 NOTE — Progress Notes (Signed)
Pulmonary/Critical Care Progress Note   Name: Faith [name] MRN: [id number] DOB: [birth year]   LOS 6 days   ADMISSION DATE:  April 11, 2013 CONSULTATION DATE:  04/16/2013  REFERRING MD :  Allyson Sabal  CHIEF COMPLAINT:  Cardiac Arrest  BRIEF PATIENT DESCRIPTION:  58 y.o. F admitted on 2/9 for profuse bleeding from right arm graft site.  Graft was clotted, failed thrombectomy.  Discharged from ER and sent home, presented again 2/12 for weakness.  Workup in ED with elevated troponins/NSTEMI.  Pt taken to cath lab 2/13 and suffered cardiac arrest post cath.  PCCM consulted for further management.  SIGNIFICANT EVENTS / STUDIES:  2/9 profuse bleeding from right arm graft site. Failed thrombectomy. Discharged from ER 2/13 Back to ER with weakness/lethargy, found to have NSTEMI/elevated troponins. Cardiac cath: Severe native vessel coronary disease with total occlusion of the proximal to mid LAD, occlusion of the circumflex in its mid-segment, and total occlusion of the proximal RCA. Patent SVG to the right coronary with mild to moderate in-stent restenosis in the proximal segment. Patent saphenous vein graft to the obtuse marginal. Patent left internal mammary graft to the LAD 2/13 Neuro consult: Suspect TME. EEG, MRI recommended 2/14 Extended conversation with pt's husband who acknowledged that pt has been in failing health for many years with declining QOL. Advanced directives and limitations of care discussed. No further ACLS under any circumstances. Cont aggressive support through WE. Once extubated, no re-intubation. 2/14 CT head: NAD 2/14 EEG: This is a technically difficult record secondary to the predominance of muscle and movement artifact. On only rare occasions can a background activity be evaluated. At these times the background is slow and no epileptiform activity is noted.  2/14 Echo: LVEF 30-35%. Diffuse HK. Grade 2 diastolic dysfunction. RV moderately dilated. PASP estimate 54 mmHg 2/15 Weaning  vasopressors. More responsive. + F/C intermittently  LINES / TUBES: R fem HD cath >>  ETT 12/13 >>> Left radial A-line 2/13 >>  L femoral TLC 2/13 >>   CULTURES: Blood 2/13>>>  ANTIBIOTICS: Zosyn 2/15>>>  SUBJECTIVE: Intermittently agitated, weaning on PS with periods of apnea, remains very difficult to arouse.  VITAL SIGNS: Temp:  [98.2 F (36.8 C)-98.8 F (37.1 C)] 98.2 F (36.8 C) (02/18 0800) Pulse Rate:  [53-113] 62 (02/18 1048) Resp:  [8-21] 18 (02/18 1048) BP: (85-161)/(38-82) 161/82 mmHg (02/17 2319) SpO2:  [94 %-100 %] 100 % (02/18 1048) Arterial Line BP: (93-191)/(36-97) 103/43 mmHg (02/18 1000) FiO2 (%):  [30 %] 30 % (02/18 0800) Weight:  [78.8 kg (173 lb 11.6 oz)-80.4 kg (177 lb 4 oz)] 78.8 kg (173 lb 11.6 oz) (02/17 1711) HEMODYNAMICS:   VENTILATOR SETTINGS: Vent Mode:  [-] PRVC FiO2 (%):  [30 %] 30 % Set Rate:  [18 bmp] 18 bmp Vt Set:  [440 mL] 440 mL PEEP:  [5 cmH20] 5 cmH20 Pressure Support:  [10 cmH20] 10 cmH20 Plateau Pressure:  [19 cmH20-22 cmH20] 21 cmH20 INTAKE / OUTPUT: Intake/Output     02/17 0701 - 02/18 0700 02/18 0701 - 02/19 0700   I.V. (mL/kg) 1052.7 (13.4) 176.2 (2.2)   NG/GT 460 60   IV Piggyback 150    Total Intake(mL/kg) 1662.7 (21.1) 236.2 (3)   Other 1612    Total Output 1612     Net +50.7 +236.2         PHYSICAL EXAMINATION: General:  Chronically ill, minimally responsive Neuro:  Not F/C HEENT: WNL Cardiovascular: Reg, no M Lungs:  CTA bilaterally Abdomen:  BS x 4,  soft, NT/ND. Musculoskeletal:  Right BKA. No edema.  PULMONARY  Recent Labs Lab 04/08/2013 1645 04/20/2013 1927 03/28/2013 1937 04/16/2013 1948 04/19/2013 2110 04/06/13 0401 04/10/13 0325  PHART 7.228* 7.336*  --   --  7.202* 7.473* 7.528*  PCO2ART 34.6* 33.0*  --   --  33.6* 32.8* 31.7*  PO2ART 301.0* 77.0*  --   --  125.0* 143.0* 68.7*  HCO3 14.5* 17.7*  --   --  13.3* 24.1* 26.2*  TCO2 16 19 17   --  14 25 27.2  O2SAT 100.0 95.0  --  78.8 98.0 99.0 95.3    CBC  Recent Labs Lab 04/07/13 0337 04/08/13 0400 04/09/13 0500  HGB 11.2* 10.4* 9.8*  HCT 34.4* 32.1* 30.8*  WBC 13.3* 8.0 9.0  PLT 122* 102* 104*   COAGULATION  Recent Labs Lab 04/12/2013 1413 03/25/2013 0439 03/26/2013 0825 04/06/2013 1600  INR 2.29* 6.27* 1.84* 1.89*   CARDIAC    Recent Labs Lab 03/31/2013 1446 04/03/2013 2245 04/14/2013 0825 04/10/2013 1245  TROPONINI 7.91* 14.40* 10.26* 8.78*    Recent Labs Lab 03/30/2013 2245  PROBNP 44743.0*   CHEMISTRY  Recent Labs Lab 04/06/13 0400 04/06/13 1600 04/07/13 0337 04/08/13 1048 04/10/13 0500  NA 139 138 139 140 138  K 4.1 3.6* 4.5 4.2 3.3*  CL 88* 91* 98 99 98  CO2 20 27 24 25 25   GLUCOSE 246* 165* 99 214* 216*  BUN 25* 21 16 34* 19  CREATININE 3.50* 2.67* 2.04* 3.55* 2.50*  CALCIUM 7.4* 7.6* 8.0* 8.2* 9.1  MG 2.2  --  2.5  --  2.2  PHOS 3.3 1.8* 2.6 2.8 2.1*   Estimated Creatinine Clearance: 24.6 ml/min (by C-G formula based on Cr of 2.5).  LIVER  Recent Labs Lab 04/08/2013 1413  04/12/2013 0439 04/20/2013 0825 04/06/2013 1600 04/06/13 0400 04/06/13 1600 04/07/13 0337 04/08/13 1048  AST  --   --   --   --  295*  --   --   --   --   ALT  --   --   --   --  41*  --   --   --   --   ALKPHOS  --   --   --   --  231*  --   --   --   --   BILITOT  --   --   --   --  0.6  --   --   --   --   PROT  --   --   --   --  7.6  --   --   --   --   ALBUMIN  --   < > 3.6  --  3.4* 3.5 3.4* 3.5 3.0*  INR 2.29*  --  6.27* 1.84* 1.89*  --   --   --   --   < > = values in this interval not displayed.  INFECTIOUS  Recent Labs Lab 03/28/2013 1900 04/12/2013 1940 04/06/13 0400 04/07/13 0337  LATICACIDVEN 10.8*  --   --   --   PROCALCITON  --  2.71 3.18 4.18   ENDOCRINE CBG (last 3)   Recent Labs  04/10/13 0017 04/10/13 0500 04/10/13 0809  GLUCAP 189* 200* 190*   IMAGING x48h  Dg Chest Port 1 View  04/10/2013   CLINICAL DATA:  Ventilator.  EXAM: PORTABLE CHEST - 1 VIEW  COMPARISON:  04/08/2013  FINDINGS:  Endotracheal tube is approximately 3 cm above the carina.  Cardiomegaly with vascular congestion and bilateral lower lobe opacities. This is increased in the right lower lobe since prior study. No visible effusions. No acute bony abnormality.  IMPRESSION: Bilateral perihilar and lower lobe opacities. This is increased slightly in the right lower lobe. This most likely represents edema. Cannot exclude infection.   Electronically Signed   By: Charlett Nose M.D.   On: 04/10/2013 05:08   ASSESSMENT / PLAN:  PULMONARY A: Acute Respiratory Failure  Spoke with husband, evidently the patient only wanted to be intubated for 1 week and tomorrow is one week and would like her extubated then.  Will meet with family at 9 AM tomorrow and determine if terminal extubation or extubate and if fails comfort measures pending her weaning status post dialysis today.  P:   - Maintain on PS as tolerated, anticipate extubation as above in AM. - Vent settings reviewed and addressed. - Cont vent bundle. - Daily SBT. - HD again today, as much volume as able off then will extubate in AM as above. - Change to precedex, hopefully that will address apnea.  CARDIOVASCULAR A:  S/p Cardiac Arrest Elevated Troponins CAD  P:  - Hold further evaluation as management. - No acute interventions at this time  RENAL A: ESRD Metabolic Acidosis, resolved Will need additional volume off prior serious consideration of extubation. P:   - HD 04/10/13 per renal.  GASTROINTESTINAL A:  No acute issues. P:   - SUP:  Pantoprazole. - Cont TFs @ current rate.  HEMATOLOGIC A:  Mild anemia without acute blood loss Mild thrombocytopenia P:  - DVT px: full dose heparin. - CBC intermittently. - PRBC for hgb < 8gm% due to cardiac arrest.  INFECTIOUS A:   No acute issues. P:   - Micro and abx as above.  ENDOCRINE A:  DM II, controlled P:   - Lantus DC'd. - Cont SSI.  NEUROLOGIC A:  Acute encephalopathy,  improving Chronic cognitive impairment, NOS Tardive Dyskinesia  P:   - Changed diprivan gtt + fent prn to precedex and PRN fentanyl. - Minimize sedation as anticipate extubation in AM. - Daily WUA.  GLOBAL See discussion in pulmonary section for plan regarding airway.  The patient is critically ill with multiple organ systems failure and requires high complexity decision making for assessment and support, frequent evaluation and titration of therapies, application of advanced monitoring technologies and extensive interpretation of multiple databases.   Critical Care Time devoted to patient care services described in this note is 35 Minutes.  Alyson Reedy, M.D. [facility] Pulmonary/Critical Care Medicine. Pager: [phone]. After hours pager: [phone].

## 2013-04-11 ENCOUNTER — Inpatient Hospital Stay (HOSPITAL_COMMUNITY): Payer: Medicare (Managed Care)

## 2013-04-11 LAB — BLOOD GAS, ARTERIAL
Acid-base deficit: 9.1 mmol/L — ABNORMAL HIGH (ref 0.0–2.0)
Bicarbonate: 13.5 mEq/L — ABNORMAL LOW (ref 20.0–24.0)
Drawn by: 31101
FIO2: 30 %
MECHVT: 440 mL
O2 Saturation: 99.5 %
PCO2 ART: 16.7 mmHg — AB (ref 35.0–45.0)
PEEP/CPAP: 5 cmH2O
Patient temperature: 98.6
RATE: 18 resp/min
TCO2: 14 mmol/L (ref 0–100)
pH, Arterial: 7.517 — ABNORMAL HIGH (ref 7.350–7.450)
pO2, Arterial: 127 mmHg — ABNORMAL HIGH (ref 80.0–100.0)

## 2013-04-11 LAB — GLUCOSE, CAPILLARY: Glucose-Capillary: 200 mg/dL — ABNORMAL HIGH (ref 70–99)

## 2013-04-11 LAB — CULTURE, BLOOD (ROUTINE X 2)
CULTURE: NO GROWTH
Culture: NO GROWTH

## 2013-04-11 LAB — BASIC METABOLIC PANEL
BUN: 24 mg/dL — ABNORMAL HIGH (ref 6–23)
CO2: 24 mEq/L (ref 19–32)
Calcium: 9.1 mg/dL (ref 8.4–10.5)
Chloride: 98 mEq/L (ref 96–112)
Creatinine, Ser: 3.06 mg/dL — ABNORMAL HIGH (ref 0.50–1.10)
GFR, EST AFRICAN AMERICAN: 18 mL/min — AB (ref >90)
GFR, EST NON AFRICAN AMERICAN: 16 mL/min — AB (ref >90)
Glucose, Bld: 228 mg/dL — ABNORMAL HIGH (ref 70–99)
Potassium: 3.4 mEq/L — ABNORMAL LOW (ref 3.7–5.3)
SODIUM: 140 meq/L (ref 137–147)

## 2013-04-11 LAB — CBC
HCT: 32.7 % — ABNORMAL LOW (ref 36.0–46.0)
HEMOGLOBIN: 10.5 g/dL — AB (ref 12.0–15.0)
MCH: 28.2 pg (ref 26.0–34.0)
MCHC: 32.1 g/dL (ref 30.0–36.0)
MCV: 87.7 fL (ref 78.0–100.0)
PLATELETS: 111 10*3/uL — AB (ref 150–400)
RBC: 3.73 MIL/uL — ABNORMAL LOW (ref 3.87–5.11)
RDW: 19.5 % — ABNORMAL HIGH (ref 11.5–15.5)
WBC: 8.2 10*3/uL (ref 4.0–10.5)

## 2013-04-11 LAB — PHOSPHORUS: PHOSPHORUS: 2.7 mg/dL (ref 2.3–4.6)

## 2013-04-11 LAB — MAGNESIUM: Magnesium: 2.2 mg/dL (ref 1.5–2.5)

## 2013-04-11 LAB — HEPARIN LEVEL (UNFRACTIONATED): HEPARIN UNFRACTIONATED: 0.58 [IU]/mL (ref 0.30–0.70)

## 2013-04-11 MED ORDER — MORPHINE SULFATE 10 MG/ML IJ SOLN
10.0000 mg/h | INTRAVENOUS | Status: DC
  Administered 2013-04-11: 10 mg/h via INTRAVENOUS
  Filled 2013-04-11 (×2): qty 10

## 2013-04-11 MED ORDER — MORPHINE BOLUS VIA INFUSION
5.0000 mg | INTRAVENOUS | Status: DC | PRN
  Administered 2013-04-11: 10 mg via INTRAVENOUS
  Administered 2013-04-11: 5 mg via INTRAVENOUS
  Filled 2013-04-11: qty 20

## 2013-04-12 LAB — GLUCOSE, CAPILLARY: Glucose-Capillary: 215 mg/dL — ABNORMAL HIGH (ref 70–99)

## 2013-04-16 MED FILL — Atropine Sulfate Inj 0.1 MG/ML: INTRAMUSCULAR | Qty: 10 | Status: AC

## 2013-04-18 ENCOUNTER — Encounter: Payer: PRIVATE HEALTH INSURANCE | Admitting: Vascular Surgery

## 2013-04-21 NOTE — Progress Notes (Signed)
Family meeting this am planned with plans for extubation as I understand. I spoke to husband and granddaughter.  Patient dialyzed last two days and I feel optimal as far as fluid status and hemodynamics will allow.  Developed hypotension with dialysis yesterday requiring termination of treatment.  Not on pressors and stable hemodynamically.  ABG shows respiratory alkalosis, Hgb ok, k 3.4.  Plans per family and CCM.  We will follow and continue dialysis.  No real change on exam. Iqra Rotundo C

## 2013-04-21 NOTE — Progress Notes (Signed)
CRITICAL VALUE ALERT  Critical value received:  PCO2 16.7  Date of notification:  04/19/2013  Time of notification:  0320am  Critical value read back: yes  Nurse who received alert:  Rivka SpringKaylee Swanson RN  MD [facility] 04/06/2013

## 2013-04-21 NOTE — Progress Notes (Signed)
Chaplain spent time with family before the pt's death.  Family was grateful for the chaplain presence, but did have their own pastoral support.

## 2013-04-21 NOTE — Procedures (Signed)
Extubation Procedure Note  Patient Details:   Name: Margo AyeBarbara J Kahan DOB: 17-Mar-1955 MRN: [id number]   Airway Documentation:     Evaluation  O2 sats: stable, pt having withdrawl of life support Complications: No apparent complications Patient did tolerate procedure well. Bilateral Breath Sounds: Other (Comment) (coarse t/o) Suctioning: Oral PT not able to speak.  Pt extubated for withdrawal of life support per family request, per order. Pt appeared comfortable t/o process.  Jennette KettleBrowning, Tanya Crothers Joy 04/19/2013, 11:46 AM

## 2013-04-21 NOTE — Progress Notes (Signed)
Pulmonary/Critical Care Progress Note   Name: Faith AyeBarbara J Mertens MRN: [id number] DOB: April 24, 1955   LOS 7 days   ADMISSION DATE:  04/06/2013 CONSULTATION DATE:  04/15/2013  REFERRING MD :  Allyson SabalBerry  CHIEF COMPLAINT:  Cardiac Arrest  BRIEF PATIENT DESCRIPTION:  58 y.o. F admitted on 2/9 for profuse bleeding from right arm graft site.  Graft was clotted, failed thrombectomy.  Discharged from ER and sent home, presented again 2/12 for weakness.  Workup in ED with elevated troponins/NSTEMI.  Pt taken to cath lab 2/13 and suffered cardiac arrest post cath.  PCCM consulted for further management.  SIGNIFICANT EVENTS / STUDIES:  2/9 profuse bleeding from right arm graft site. Failed thrombectomy. Discharged from ER 2/13 Back to ER with weakness/lethargy, found to have NSTEMI/elevated troponins. Cardiac cath: Severe native vessel coronary disease with total occlusion of the proximal to mid LAD, occlusion of the circumflex in its mid-segment, and total occlusion of the proximal RCA. Patent SVG to the right coronary with mild to moderate in-stent restenosis in the proximal segment. Patent saphenous vein graft to the obtuse marginal. Patent left internal mammary graft to the LAD 2/13 Neuro consult: Suspect TME. EEG, MRI recommended 2/14 Extended conversation with pt's husband who acknowledged that pt has been in failing health for many years with declining QOL. Advanced directives and limitations of care discussed. No further ACLS under any circumstances. Cont aggressive support through WE. Once extubated, no re-intubation. 2/14 CT head: NAD 2/14 EEG: This is a technically difficult record secondary to the predominance of muscle and movement artifact. On only rare occasions can a background activity be evaluated. At these times the background is slow and no epileptiform activity is noted.  2/14 Echo: LVEF 30-35%. Diffuse HK. Grade 2 diastolic dysfunction. RV moderately dilated. PASP estimate 54 mmHg 2/15 Weaning  vasopressors. More responsive. + F/C intermittently  LINES / TUBES: R fem HD cath >>  ETT 12/13 >>> Left radial A-line 2/13 >>  L femoral TLC 2/13 >>   CULTURES: Blood 2/13>>>  ANTIBIOTICS: Zosyn 2/15>>>  SUBJECTIVE: Intermittently agitated, failed weaning this AM.  VITAL SIGNS: Temp:  [97.6 F (36.4 C)-98.4 F (36.9 C)] 98.4 F (36.9 C) (02/19 0724) Pulse Rate:  [45-71] 50 (02/19 1000) Resp:  [14-26] 14 (02/19 1000) BP: (70-137)/(32-56) 127/44 mmHg (02/19 0810) SpO2:  [89 %-100 %] 100 % (02/19 1000) Arterial Line BP: (91-154)/(37-71) 96/39 mmHg (02/19 1000) FiO2 (%):  [30 %] 30 % (02/19 0810) HEMODYNAMICS:   VENTILATOR SETTINGS: Vent Mode:  [-] PRVC FiO2 (%):  [30 %] 30 % Set Rate:  [14 bmp-18 bmp] 14 bmp Vt Set:  [440 mL] 440 mL PEEP:  [5 cmH20] 5 cmH20 Plateau Pressure:  [18 cmH20-21 cmH20] 21 cmH20 INTAKE / OUTPUT: Intake/Output     02/18 0701 - 02/19 0700 02/19 0701 - 02/20 0700   I.V. (mL/kg) 1163.8 (14.7) 127.5 (1.6)   NG/GT 500 60   IV Piggyback 150    Total Intake(mL/kg) 1813.8 (22.9) 187.5 (2.4)   Other 333    Total Output 333     Net +1480.8 +187.5         PHYSICAL EXAMINATION: General:  Chronically ill, minimally responsive Neuro:  Not F/C HEENT: WNL Cardiovascular: Reg, no M Lungs:  CTA bilaterally Abdomen:  BS x 4, soft, NT/ND. Musculoskeletal:  Right BKA. No edema.  PULMONARY  Recent Labs Lab 04/14/2013 1927 04/03/2013 1937 04/02/2013 1948 04/03/2013 2110 04/06/13 0401 04/10/13 0325 03/24/2013 0500  PHART 7.336*  --   --  7.202* 7.473* 7.528* 7.517*  PCO2ART 33.0*  --   --  33.6* 32.8* 31.7* 16.7*  PO2ART 77.0*  --   --  125.0* 143.0* 68.7* 127.0*  HCO3 17.7*  --   --  13.3* 24.1* 26.2* 13.5*  TCO2 19 17  --  14 25 27.2 14.0  O2SAT 95.0  --  78.8 98.0 99.0 95.3 99.5   CBC  Recent Labs Lab 04/08/13 0400 04/09/13 0500 04/12/2013 0400  HGB 10.4* 9.8* 10.5*  HCT 32.1* 30.8* 32.7*  WBC 8.0 9.0 8.2  PLT 102* 104* 111*    COAGULATION  Recent Labs Lab 04/10/2013 1413 03/29/2013 0439 03/24/2013 0825 04/12/2013 1600  INR 2.29* 6.27* 1.84* 1.89*   CARDIAC    Recent Labs Lab 04/03/2013 1446 04/02/2013 2245 04/08/2013 0825 03/28/2013 1245  TROPONINI 7.91* 14.40* 10.26* 8.78*    Recent Labs Lab 04/02/2013 2245  PROBNP 44743.0*   CHEMISTRY  Recent Labs Lab 04/06/13 0400 04/06/13 1600 04/07/13 0337 04/08/13 1048 04/10/13 0500 04/10/2013 0400  NA 139 138 139 140 138 140  K 4.1 3.6* 4.5 4.2 3.3* 3.4*  CL 88* 91* 98 99 98 98  CO2 20 27 24 25 25 24   GLUCOSE 246* 165* 99 214* 216* 228*  BUN 25* 21 16 34* 19 24*  CREATININE 3.50* 2.67* 2.04* 3.55* 2.50* 3.06*  CALCIUM 7.4* 7.6* 8.0* 8.2* 9.1 9.1  MG 2.2  --  2.5  --  2.2 2.2  PHOS 3.3 1.8* 2.6 2.8 2.1* 2.7   Estimated Creatinine Clearance: 20.2 ml/min (by C-G formula based on Cr of 3.06).  LIVER  Recent Labs Lab 03/30/2013 1413  04/07/2013 0439 03/27/2013 0825 04/19/2013 1600 04/06/13 0400 04/06/13 1600 04/07/13 0337 04/08/13 1048  AST  --   --   --   --  295*  --   --   --   --   ALT  --   --   --   --  41*  --   --   --   --   ALKPHOS  --   --   --   --  231*  --   --   --   --   BILITOT  --   --   --   --  0.6  --   --   --   --   PROT  --   --   --   --  7.6  --   --   --   --   ALBUMIN  --   < > 3.6  --  3.4* 3.5 3.4* 3.5 3.0*  INR 2.29*  --  6.27* 1.84* 1.89*  --   --   --   --   < > = values in this interval not displayed.  INFECTIOUS  Recent Labs Lab 04/12/2013 1900 04/15/2013 1940 04/06/13 0400 04/07/13 0337  LATICACIDVEN 10.8*  --   --   --   PROCALCITON  --  2.71 3.18 4.18   ENDOCRINE CBG (last 3)   Recent Labs  04/10/13 1935 04/10/13 2320 03/24/2013 0355  GLUCAP 157* 162* 200*   IMAGING x48h  Dg Chest Port 1 View  03/24/2013   CLINICAL DATA:  Endotracheal tube placement, shortness of breath.  EXAM: PORTABLE CHEST - 1 VIEW  COMPARISON:  DG CHEST 1V PORT dated 04/10/2013  FINDINGS: Cardiac silhouette remains moderately  enlarged, status post median sternotomy. Mildly calcified aortic knob, mediastinal silhouette is nonsuspicious. Increasing retrocardiac consolidation. Mild central pulmonary vasculature  congestion. Slightly decreased right lower lobe airspace opacity. No pneumothorax.  Endotracheal tube tip projects 2.4 cm above the carina. Nasogastric tube past the proximal stomach, distal tip not imaged. Dialysis catheter via inferior vena cava approach with distal tip projecting in right atrium. Multiple EKG lines overlie the patient and may obscure subtle underlying pathology. Surgical vascular clips in the right arm.  IMPRESSION: Stable cardiomegaly, worsening retrocardiac consolidation. Slightly decreased right lower lobe airspace opacity.  No apparent change in position of life support lines.   Electronically Signed   By: Awilda Metro   On: 04-22-13 06:08   Dg Chest Port 1 View  04/10/2013   CLINICAL DATA:  Ventilator.  EXAM: PORTABLE CHEST - 1 VIEW  COMPARISON:  04/08/2013  FINDINGS: Endotracheal tube is approximately 3 cm above the carina. Cardiomegaly with vascular congestion and bilateral lower lobe opacities. This is increased in the right lower lobe since prior study. No visible effusions. No acute bony abnormality.  IMPRESSION: Bilateral perihilar and lower lobe opacities. This is increased slightly in the right lower lobe. This most likely represents edema. Cannot exclude infection.   Electronically Signed   By: Charlett Nose M.D.   On: 04/10/2013 05:08   ASSESSMENT / PLAN:  PULMONARY A: Acute Respiratory Failure  Spoke with husband, evidently the patient only wanted to be intubated for 1 week and tomorrow is one week and would like her extubated then.  Will meet with family at 9 AM tomorrow and determine if terminal extubation or extubate and if fails comfort measures pending her weaning status post dialysis today.  P:   - Terminal extubation today after morphine is started. - Full comfort  measures.  CARDIOVASCULAR A:  S/p Cardiac Arrest Elevated Troponins CAD  P:  - Full comfort care.  RENAL A: ESRD Metabolic Acidosis, resolved Will need additional volume off prior serious consideration of extubation. P:   - Full comfort care. - D/C levophed, appreciate help from renal service.  GASTROINTESTINAL A:  No acute issues. P:   - D/C TF.  HEMATOLOGIC A:  Mild anemia without acute blood loss Mild thrombocytopenia P:  - D/C further blood drops.  INFECTIOUS A:   No acute issues. P:   - D/C abx.  ENDOCRINE A:  DM II, controlled P:   - D/C CBG and SSI.  NEUROLOGIC A:  Acute encephalopathy, improving Chronic cognitive impairment, NOS Tardive Dyskinesia  P:   - Morphine for comfort measures.  GLOBAL Discussed with patient's husband and family.  Patient only wanted intubation for 1 week and family wishes for full comfort at this point.  Full DNR and start morphine then terminally extubate.  The patient is critically ill with multiple organ systems failure and requires high complexity decision making for assessment and support, frequent evaluation and titration of therapies, application of advanced monitoring technologies and extensive interpretation of multiple databases.   Critical Care Time devoted to patient care services described in this note is 35 Minutes.  Alyson Reedy, M.D. [facility] Pulmonary/Critical Care Medicine. Pager: [phone]. After hours pager: [phone].

## 2013-04-21 NOTE — Progress Notes (Signed)
NUTRITION FOLLOW UP/ Brief Note  Intervention:   Transition to comfort care.  Assessment:   Patient with PMH of CAD with CABG, ESRD, DM2, CVA and AKA; presented to ER with weakness after placement of new AV graft and removal of old graft, some bleeding post procedure.   Underwent L heart cath, angiography, L ventriculography, bypass graft angiography on 2/13. Patient suffered cardiac arrest s/p cath and taken to ICU.  Pt off CRRT. IHD started on 2/18.Body fluid volume improved. Developed hypotension with dialysis requiring termination of treatment.  Pt to transition to comfort care as request per family.  Height: Ht Readings from Last 1 Encounters:  04/01/2013 5\' 4"  (1.626 m)    Weight Status:   Wt Readings from Last 1 Encounters:  04/10/13 174 lb 9.7 oz (79.2 kg)  04/08/13 180 lb Admit wt  169 lb  Skin: R arm incision  Diet Order:  NPO    Intake/Output Summary (Last 24 hours) at 03/26/2013 1125 Last data filed at 04/04/2013 1100  Gross per 24 hour  Intake 1754.63 ml  Output    333 ml  Net 1421.63 ml    Last BM: PTA   Labs:   Recent Labs Lab 04/07/13 0337 04/08/13 1048 04/10/13 0500 04/12/2013 0400  NA 139 140 138 140  K 4.5 4.2 3.3* 3.4*  CL 98 99 98 98  CO2 24 25 25 24   BUN 16 34* 19 24*  CREATININE 2.04* 3.55* 2.50* 3.06*  CALCIUM 8.0* 8.2* 9.1 9.1  MG 2.5  --  2.2 2.2  PHOS 2.6 2.8 2.1* 2.7  GLUCOSE 99 214* 216* 228*    CBG (last 3)   Recent Labs  04/10/13 1935 04/10/13 2320 04/14/2013 0355  GLUCAP 157* 162* 200*    Scheduled Meds:    Continuous Infusions: . sodium chloride 10 mL/hr at 04/05/2013 0800  . morphine 10 mg/hr (04/06/2013 1013)  . propofol 10 mcg/kg/min (04/14/2013 1100)    Marijean NiemannStephanie Zaire [place] Dietetic Intern Pager: [phone]

## 2013-04-21 NOTE — Progress Notes (Signed)
Comfort care measures initiated; morphine gtt started at 1013; emotional support given to pt & family

## 2013-04-21 NOTE — Progress Notes (Signed)
Nutrition Brief Note  Chart reviewed. Pt now transitioning to comfort care.  No further nutrition interventions warranted at this time. Agree with documentation of dietetic intern.  Please re-consult as needed.   Loyce DysKacie Dijon Kohlman, MS RD LDN Clinical Inpatient Dietitian Pager: [phone] Weekend/After hours pager: [phone]

## 2013-04-21 NOTE — Progress Notes (Signed)
ANTICOAGULATION CONSULT NOTE - Follow Up Consult  Pharmacy Consult for heparin Indication: NSTEMI  Allergies  Allergen Reactions  . Lipitor [Atorvastatin Calcium] Other (See Comments)    Muscle weakness  . Codeine Nausea And Vomiting  . Metoclopramide Hcl Other (See Comments)    Tardive dyskinesia  . Iohexol Itching    Facility where pt lives does not have this listed as an allergy and she has not been pre-medicated in the past  . Oxycodone Hypertension    Patient Measurements: Height: 5\' 4"  (162.6 cm) Weight: 174 lb 9.7 oz (79.2 kg) IBW/kg (Calculated) : 54.7 Heparin Dosing Weight: 71.8 kg  Vital Signs: Temp: 98.4 F (36.9 C) (02/19 0724) Temp src: Oral (02/19 0724) BP: 137/56 mmHg (02/19 0336) Pulse Rate: 57 (02/19 0700)  Labs:  Recent Labs  04/08/13 1048  04/09/13 0500 04/09/13 1616 04/10/13 0400 04/10/13 0500 04/15/2013 0400  HGB  --   --  9.8*  --   --   --  10.5*  HCT  --   --  30.8*  --   --   --  32.7*  PLT  --   --  104*  --   --   --  111*  HEPARINUNFRC  --   < > 0.28* 0.26* 0.28*  --  0.58  CREATININE 3.55*  --   --   --   --  2.50* 3.06*  < > = values in this interval not displayed.  Estimated Creatinine Clearance: 20.2 ml/min (by C-G formula based on Cr of 3.06).   Medications:  Infusions:  . sodium chloride 10 mL/hr at 04/10/13 0800  . sodium chloride 10 mL/hr at 04/10/13 1806  . dexmedetomidine Stopped (04/10/13 1240)  . dexmedetomidine 1 mcg/kg/hr (04/10/13 1200)  . heparin 1,250 Units/hr (04/10/13 2137)  . propofol 20 mcg/kg/min (04/03/2013 0535)    Assessment: 58yo female on heparin after resumed post-cath on 2/13 which showed EF 60%, severe CAD with total occlusion of mid LAD, LCx, and proximal RCA, patent SVG to RCA with mild in-stent restenosis, patent SVG to OM, patent LIMA to LAD. Patient coded post-cath and has been intubated since then. Pharmacy is consulted to manage her anticoagulation with heparin.  Heparin level is slightly  supraherapeutic at 0.58 on 1250 units/hr since we are targeting a lower goal while r/o stroke (MRI pending patient clinical status). No bleeding noted, CBC low but stable (Hgb 10.5, pltc 111) - will continue to watch trend.    Goal of Therapy:  Heparin level 0.3-0.5 units/ml (while r/o stroke) Monitor platelets by anticoagulation protocol: Yes   Plan:  -Decrease heparin drip to 1200 units/hr -8 hr heparin level -Daily heparin level and CBC -Monitor for s/sx of bleeding -F/U anticoagulation plans after scheduled meeting with family regarding goals of care  Latiesha Harada C. Luvina Poirier, PharmD Clinical Pharmacist-Resident Pager: [phone] Pharmacy: [phone] 04/05/2013 7:40 AM

## 2013-04-21 NOTE — Progress Notes (Addendum)
Pt's death pronounced at 1400; no heart tones auscultated for 2 full minutes; witnessed by 2 RNs;  P. Lynel Forester RN

## 2013-04-21 DEATH — deceased

## 2013-04-26 NOTE — Discharge Summary (Signed)
Expiration Note  Faith [name] MRN: [id number] DOB: November 17, 1955  Admit Date: 04/16/2013 Time & Date of Death:  2/29/15 1400  Attending Physician: Faith [name] PCP: Faith [name]   Consults: CCM, Faith [name], Nephrology Faith [name], Neurology Faith [name]  Cause of Death: Respiratory arrest after terminal weaning and extubation  Principal Problem:   NSTEMI Active Problems:   ESRD (end stage renal disease) on dialysis   Diabetes mellitus, type 2   CAD (coronary artery disease) of artery bypass graft   Tardive dyskinesia   Acute respiratory failure   Cardiogenic shock   Altered mental status   Cardiac arrest   Procedures: Coronary angiogram Feb 28, 2013   Hostpital Course: 58 yo AA female followed by Faith [name] with a history of CAD.  She had CABG in 2002 (left internal mammary to the left anterior descending coronary artery, reversed saphenous vein graft to the distal right coronary artery, reversed saphenous vein graft to the obtuse marginal coronary artery), grade 2 diastolic dysfunction, EF of 60-65% by echo 11/17/09, ESRD, PAD, DM2, CVA, HLD, and secondary hyperparathyroidism. Faith. Clarene [name] cathed her in Jan 2011 which showed patent grafts and an 80% long, tubular AV groove Circ lesion not amendable to intervention. She was seen in the ED on 2/9 for uncontrolled bleeding and subsequent thrombosis of her right upper arm AVG. She underwent thrombosis of her RUE AVG and placement of a new RUE AVG by Faith [name] on 04/02/13. She apparently had bleeding from the site as well as some weakness and mental status changes at her care facility after this. The mental status changes were attributed to narcotics she was given post procedure. She presented to the ER 04/03/2013 with chest pain. Her EKG showed NSR with RBBB and TWI. Her Troponin was 9.75 and peaked at 14 c/w with a NSTEMI. She was taken to the cath lab Feb 28, 2013. Prior to that she was seen in consult by the renal service.                  The  catheterization procedure was performed after initially being canceled due to lethargy and low blood pressure. The treating team (Faith. Allyson [name]) felt that proceeding with catheterization was needed and CAD to be the source of her hypotension. The catheterization performed without sedation, demonstrated stable anatomy compared to 2011.                   Following the coronary angiography and after the sheath pull with good hemostasis in the cath lab recovery area, the patient stopped breathing and CPR was performed. She had chest compressions for less than 5 minutes on 2 occasions. Epinephrine would improve blood pressure and the patient would become responsive and start having spontaneous respirations. With recurring episodes of apnea. Faith [name] was present and had her electively intubated by anesthesia.                     The pt was transferred back to CCU. She was seen by Faith [name] with CCM. She was placed on IV pressors. The pt was also seen by Faith [name] with the neurology service. An EEG done 04/06/13 showed decreased backround activity but was a difficult study to read secondary to movement. During the next few days she was given full support. The plan was to do an MRI when extubated. She failed weaning and extubation and a family conference was held. It was decided to terminally wean her after one week of  extubation. The husband indicated that that as his wife's wishes. On 04/09/2013 she weaned and di not have spontaneous respiration. She was a DNR at this point. She was pronounced dead at 1400.                            Faith [name], M.D., M.S. [facility] [address] [place], [place]  [address]  [phone]  04/26/2013 1:28 PM

## 2013-12-23 ENCOUNTER — Encounter (HOSPITAL_COMMUNITY): Payer: Self-pay | Admitting: Emergency Medicine

## 2014-01-30 ENCOUNTER — Encounter (HOSPITAL_COMMUNITY): Payer: Self-pay | Admitting: Vascular Surgery

## 2014-03-06 ENCOUNTER — Encounter (HOSPITAL_COMMUNITY): Payer: Self-pay | Admitting: Interventional Cardiology

## 2015-02-28 IMAGING — CR DG CHEST 2V
1 series · 1 of 1 positions shown · non-contrast
Comparison: April 02, 2013.

CLINICAL DATA: Chest pain, shortness of breath.

EXAM:
CHEST  2 VIEW

[w chest lat]
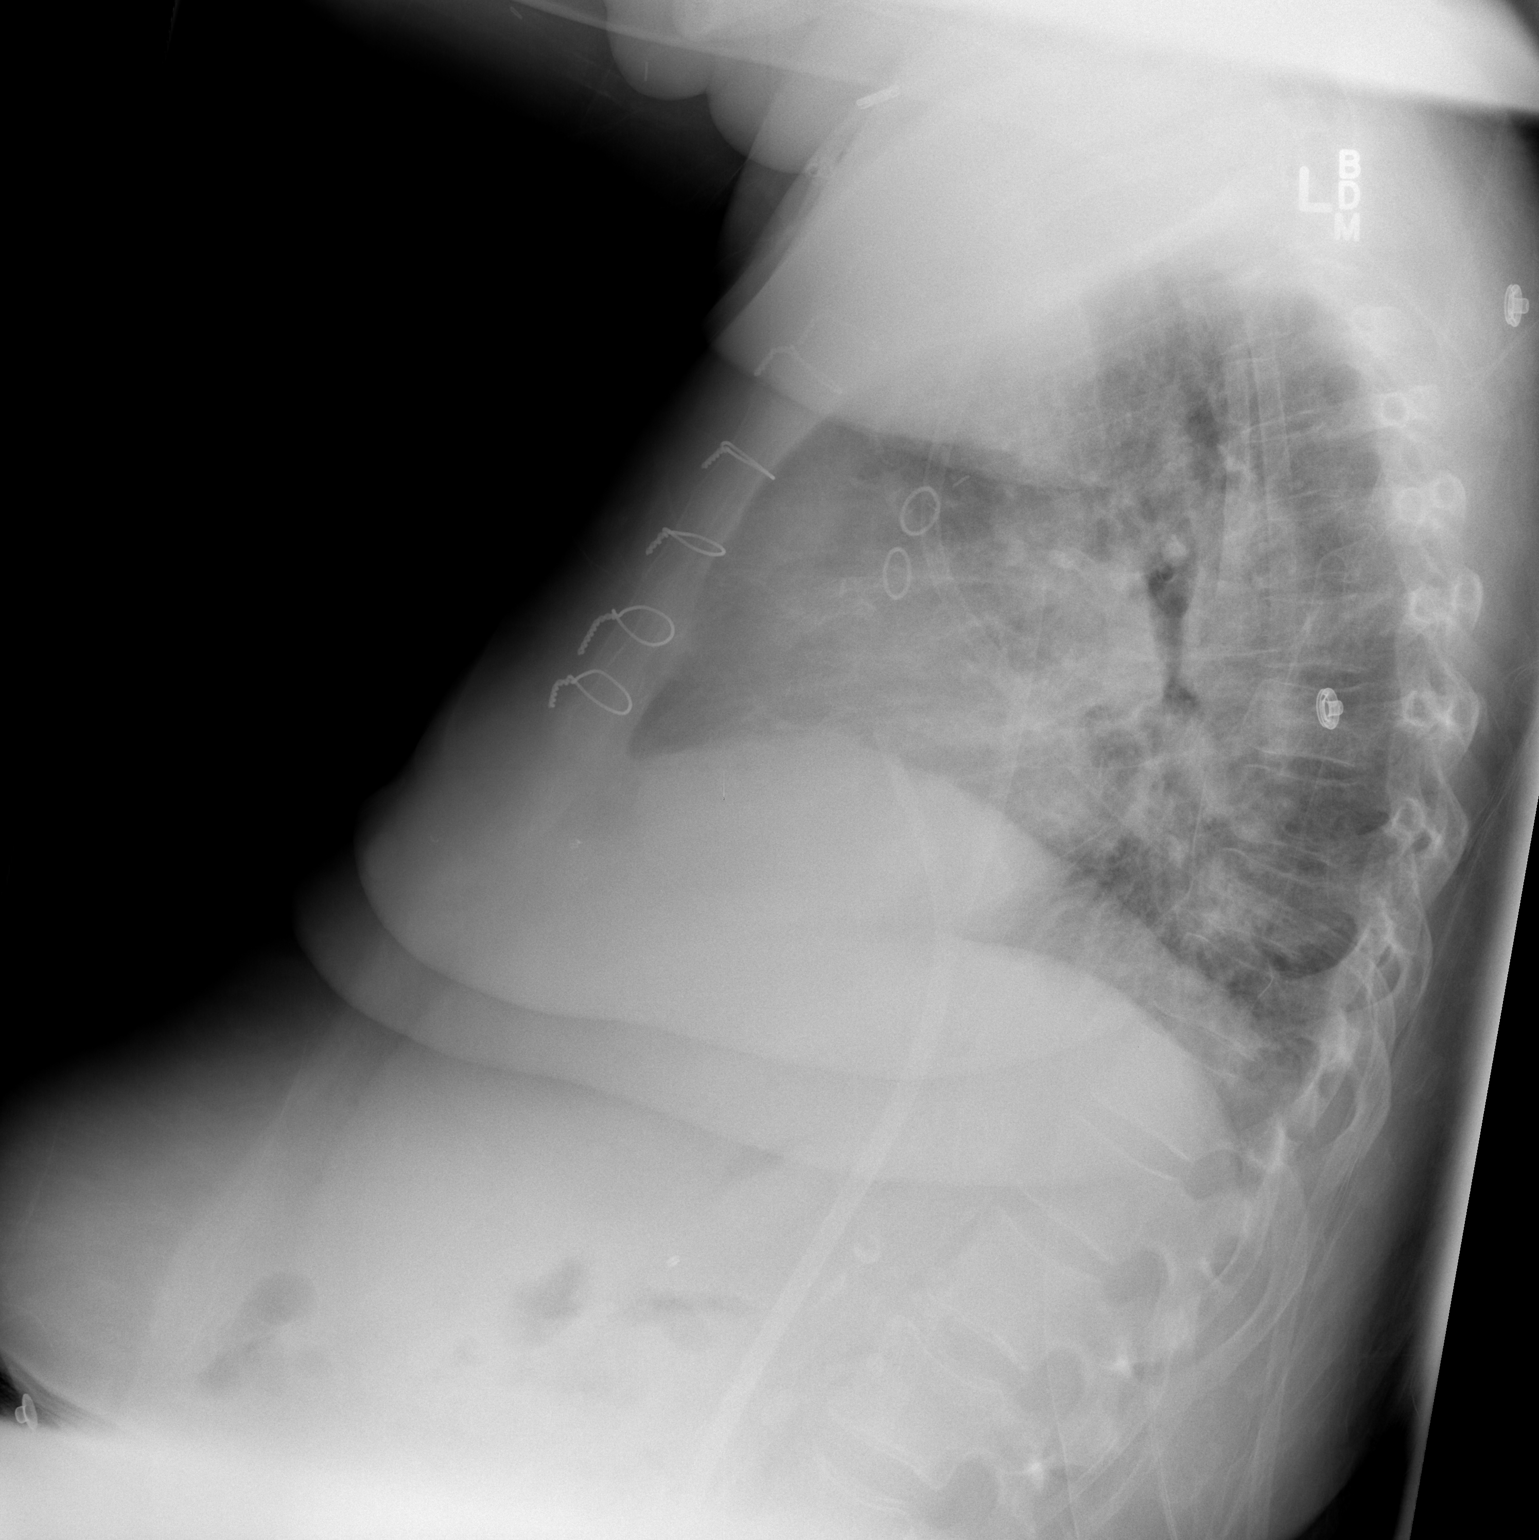

[1 of 1 positions shown; findings below may reference images not displayed]

FINDINGS: Stable cardiomegaly. Status post coronary artery bypass graft.
Distal tips of dialysis catheter extending from inferior vena cava
are unchanged in position within the right atrium. Stable central
pulmonary vascular congestion is noted. Bilateral pulmonary edema
noted on prior exam does appear to be significantly improved. No
pneumothorax or significant pleural effusion is noted.
IMPRESSION: Bilateral pulmonary edema noted on prior exam does appear to be
significantly improved, although mild central pulmonary vascular
congestion remains.

## 2015-03-01 IMAGING — XA IR US GUIDE VASC ACCESS RIGHT
1 series · 2 of 2 positions shown · non-contrast
Comparison: none

CLINICAL DATA: Cardiac arrest and need for additional central
venous access. History of failed bilateral dialysis access and
status post recent placement of right femoral tunneled hemodialysis
catheter.

[Series 1: run · 2 of 2 slices shown]
[im 1/2]
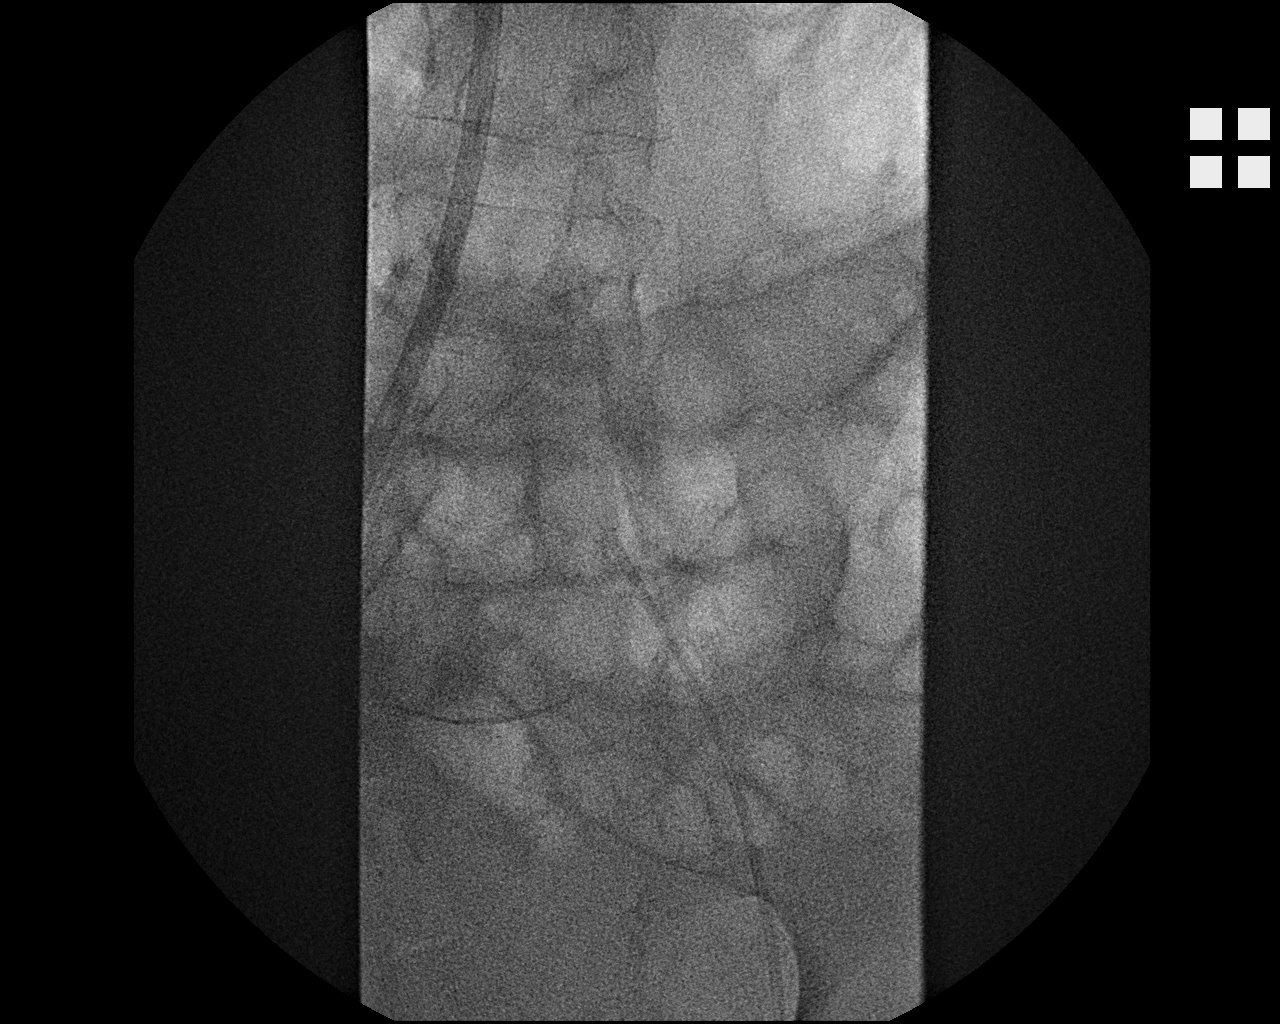
[im 2/2]
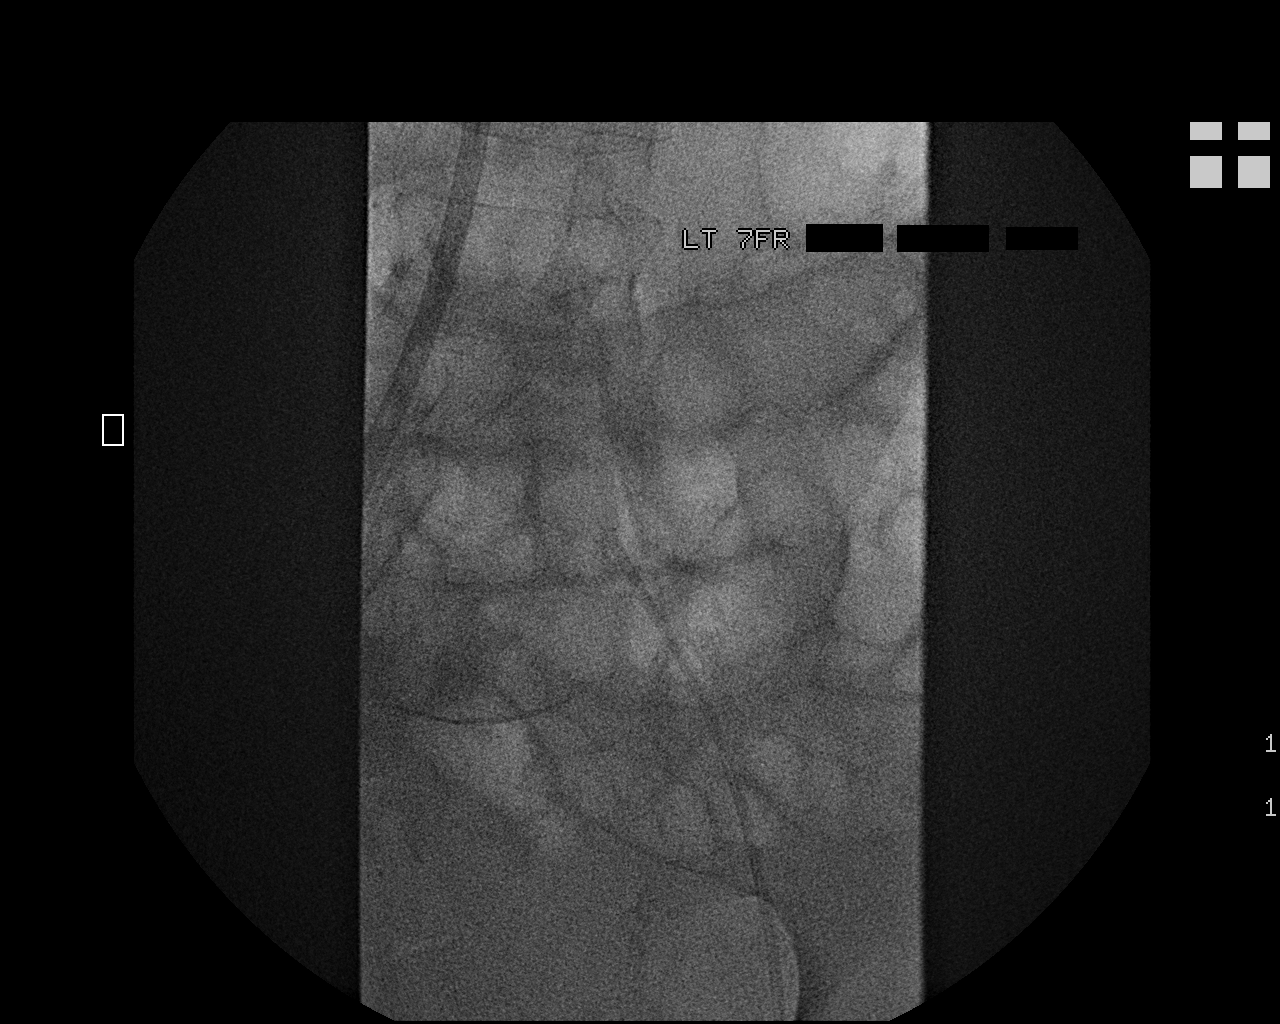

[2 of 2 positions shown; findings below may reference images not displayed]

EXAM:
NON-TUNNELED CENTRAL VENOUS CATHETER PLACEMENT WITH ULTRASOUND AND
FLUOROSCOPIC GUIDANCE

FLUOROSCOPY TIME:  12 seconds.

PROCEDURE:
Consent was obtained from the patient's husband.

The right groin was prepped with chlorhexidine in a sterile fashion,
and a sterile drape was applied covering the operative field.
Maximum barrier sterile technique with sterile gowns and gloves were
used for the procedure. Local anesthesia was provided with 1%
lidocaine.

Ultrasound was used to confirm patency of the left common femoral
vein. After creating a small venotomy incision, a 21 gauge needle
was advanced into the left common femoral vein under direct,
real-time ultrasound guidance. Ultrasound image documentation was
performed.

After establishing guidewire access, the venotomy was dilated. A 7
French, 20 cm Arrow triple-lumen central venous catheter was
advanced over the wire. The catheter exit site was secured with
0-Prolene retention sutures.

COMPLICATIONS:
None.
FINDINGS: After catheter placement, the tip lies in the left common iliac
vein. The catheter aspirates normally and is ready for immediate
use.
IMPRESSION: Placement of non-tunneled central venous catheter via the left
common femoral vein. The catheter tip lies in the left common iliac
vein. The catheter is ready for immediate use.

## 2015-03-02 IMAGING — CT CT HEAD W/O CM
1 of 2 series · 13 of 30 positions shown, 17 images · non-contrast
Comparison: None

CLINICAL DATA: Acute mental status changes. Cardiac arrest
following cardiac catheterization.

EXAM:
CT HEAD WITHOUT CONTRAST
TECHNIQUE: Contiguous axial images were obtained from the base of the skull
through the vertex without contrast.

[Series 1: — · axial · 0.49mm/px · z∈[-282,-122]mm · 13 of 38 slices shown, 17 images]
[im 3/38  brain]
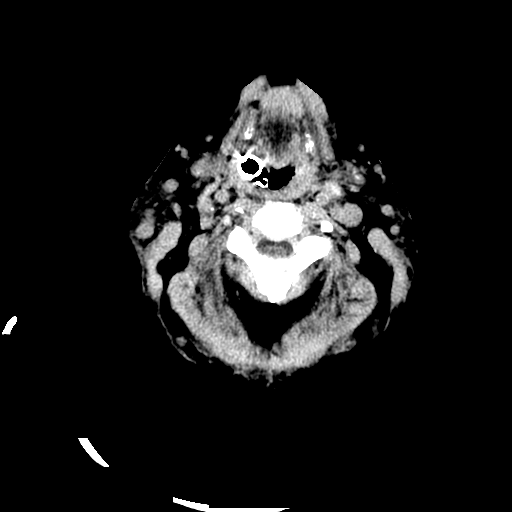
[im 3/38  bone]
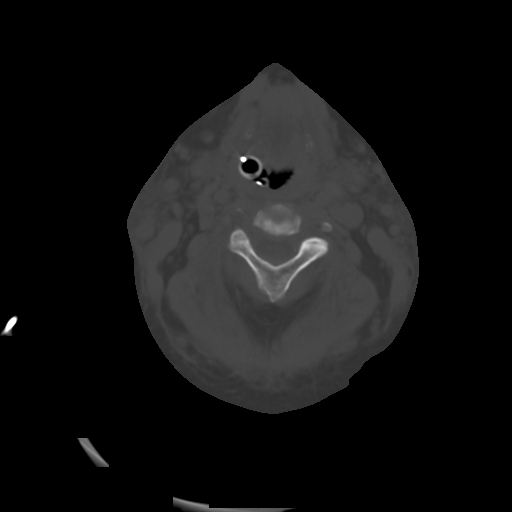
[im 6/38  brain]
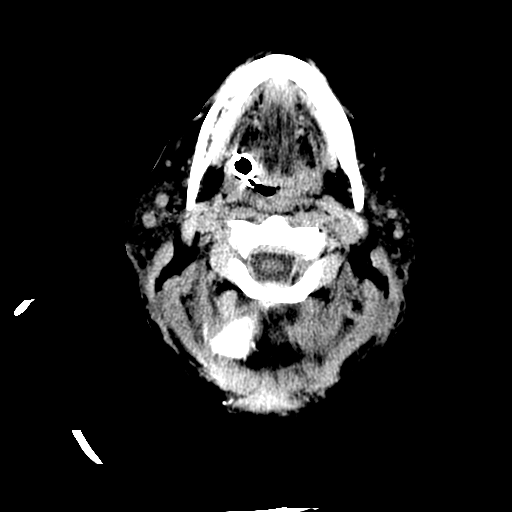
[im 8/38  brain]
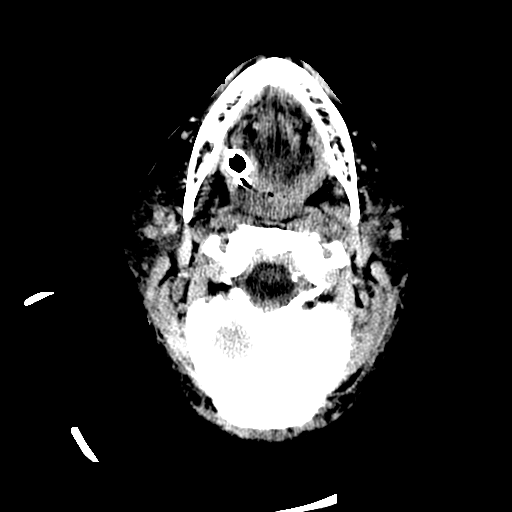
[im 11/38  brain]
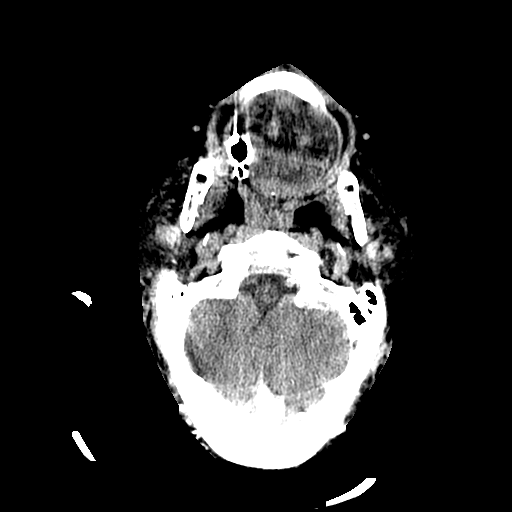
[im 14/38  brain]
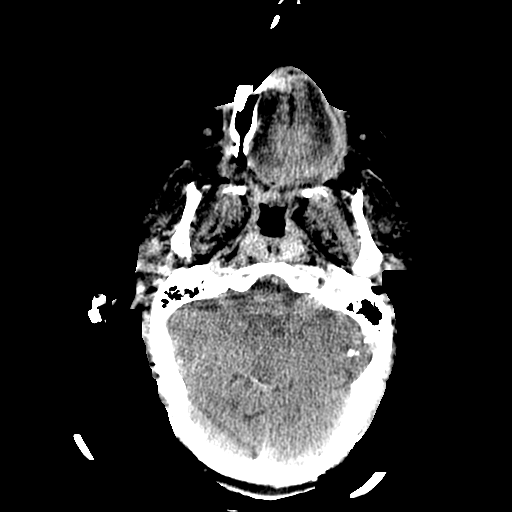
[im 14/38  bone]
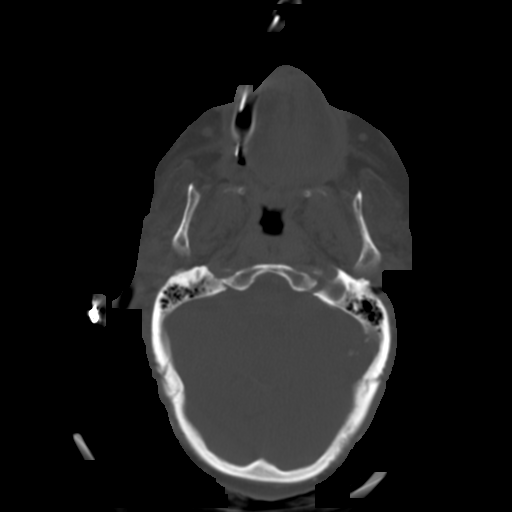
[im 16/38  brain]
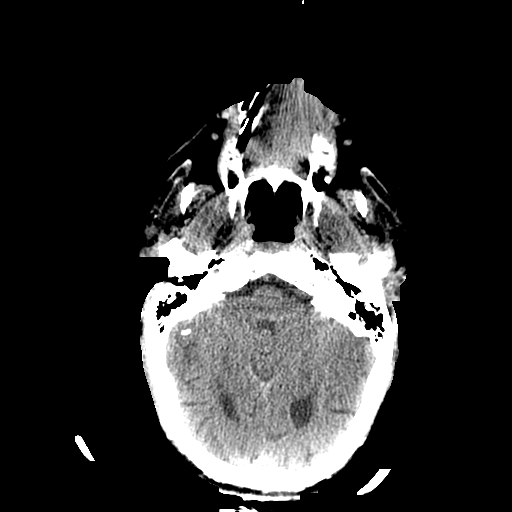
[im 19/38  brain]
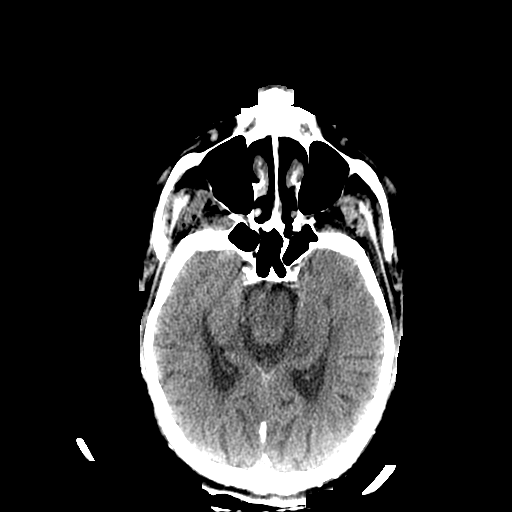
[im 22/38  brain]
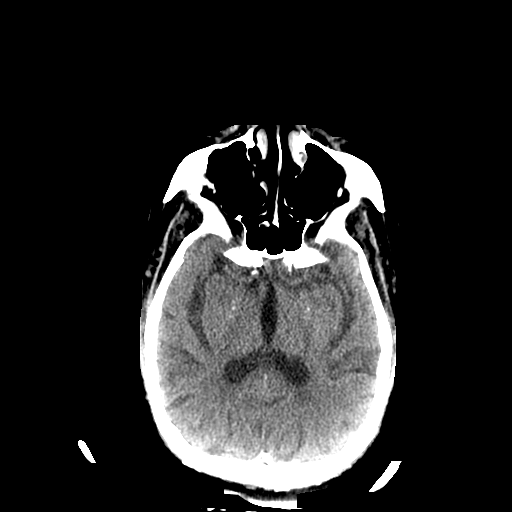
[im 24/38  brain]
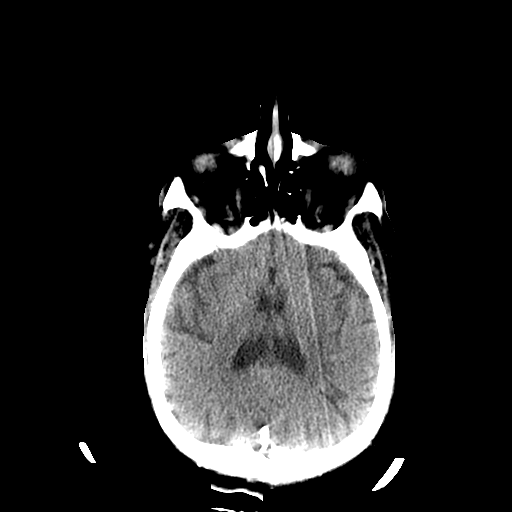
[im 24/38  bone]
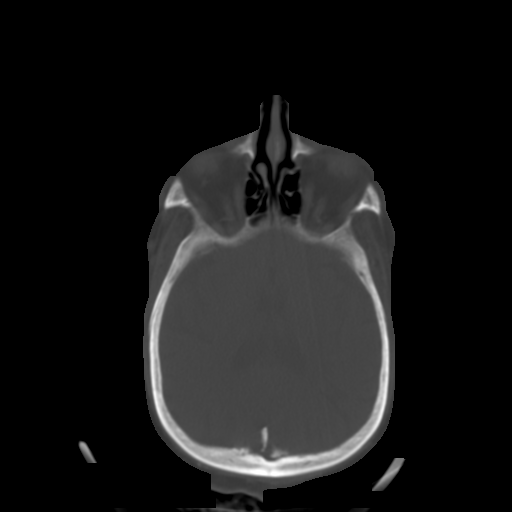
[im 27/38  brain]
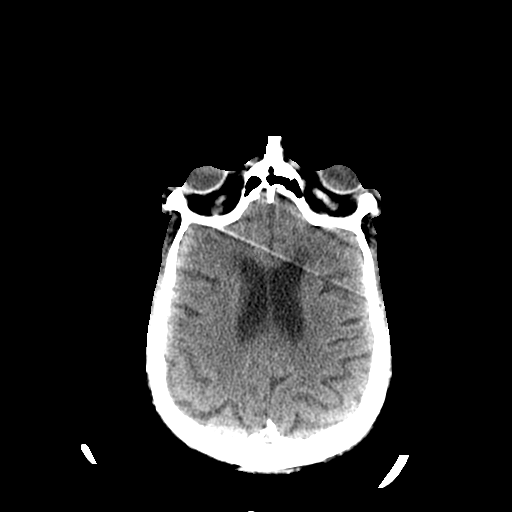
[im 30/38  brain]
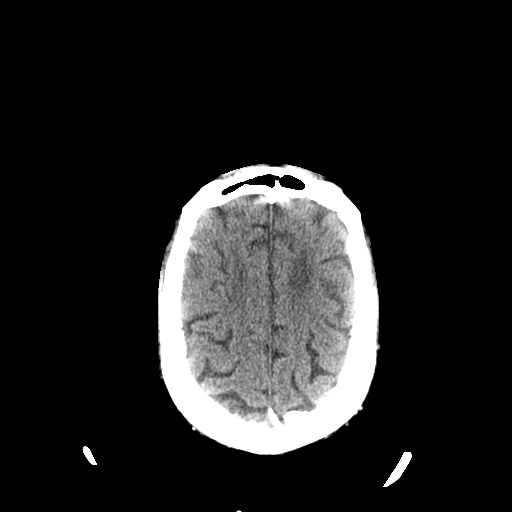
[im 32/38  brain]
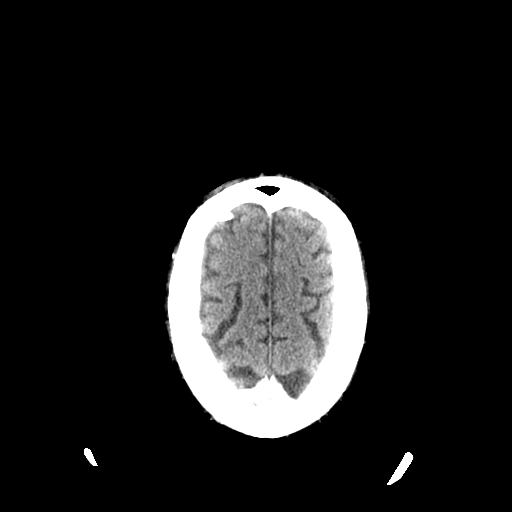
[im 35/38  brain]
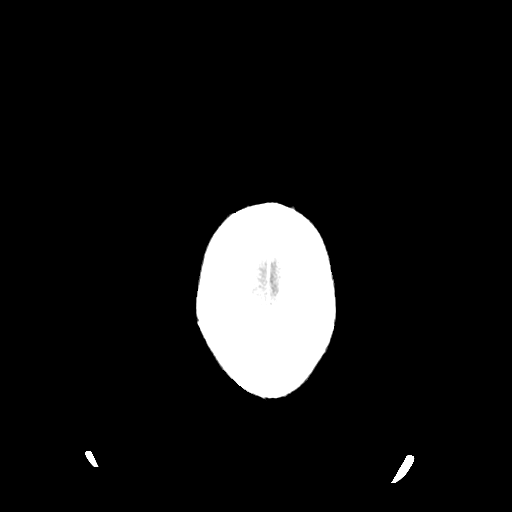
[im 35/38  bone]
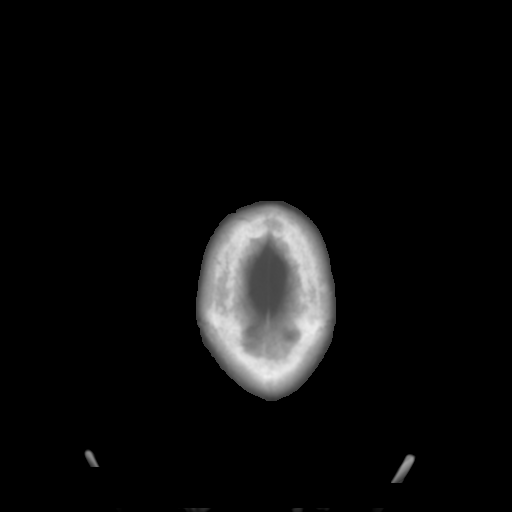

[13 of 30 positions shown; findings below may reference images not displayed]

FINDINGS: Portable apparatus was used.  Image quality is reduced.

Premature for age cerebral and cerebellar atrophy. Probable small
vessel disease, with hypoattenuation of the deep white matter. No
visible acute stroke, intracranial hemorrhage, mass lesion,
hydrocephalus, or extra-axial fluid. Preservation of normal
gray-white attenuation. Basilar cisterns are not effaced. Dense
vascular calcification affecting the carotid and vertebral arteries.
Calvarium intact. No acute sinus or mastoid disease.
IMPRESSION: Premature for age cerebral and cerebellar atrophy. Small vessel
disease.

There are no features which suggest cerebral anoxia on today's
examination. Gray-white junction is preserved, and there is no
diffuse cerebral edema.
